# Patient Record
Sex: Male | Born: 1950 | Race: Black or African American | Hispanic: No | Marital: Single | Smoking: Never smoker
Health system: Southern US, Community
[De-identification: ages and names within clinical notes are randomized; demographics above are authoritative.]

## PROBLEM LIST (undated history)

## (undated) DIAGNOSIS — J45909 Unspecified asthma, uncomplicated: Secondary | ICD-10-CM

## (undated) DIAGNOSIS — J189 Pneumonia, unspecified organism: Secondary | ICD-10-CM

## (undated) DIAGNOSIS — N184 Chronic kidney disease, stage 4 (severe): Secondary | ICD-10-CM

## (undated) DIAGNOSIS — F0390 Unspecified dementia without behavioral disturbance: Secondary | ICD-10-CM

## (undated) DIAGNOSIS — K5900 Constipation, unspecified: Secondary | ICD-10-CM

## (undated) DIAGNOSIS — D649 Anemia, unspecified: Secondary | ICD-10-CM

## (undated) DIAGNOSIS — I10 Essential (primary) hypertension: Secondary | ICD-10-CM

## (undated) DIAGNOSIS — H3500 Unspecified background retinopathy: Secondary | ICD-10-CM

## (undated) DIAGNOSIS — E785 Hyperlipidemia, unspecified: Secondary | ICD-10-CM

## (undated) DIAGNOSIS — E1121 Type 2 diabetes mellitus with diabetic nephropathy: Secondary | ICD-10-CM

## (undated) DIAGNOSIS — I480 Paroxysmal atrial fibrillation: Secondary | ICD-10-CM

## (undated) DIAGNOSIS — I639 Cerebral infarction, unspecified: Secondary | ICD-10-CM

## (undated) DIAGNOSIS — I4892 Unspecified atrial flutter: Secondary | ICD-10-CM

## (undated) DIAGNOSIS — I739 Peripheral vascular disease, unspecified: Secondary | ICD-10-CM

## (undated) DIAGNOSIS — Z95810 Presence of automatic (implantable) cardiac defibrillator: Secondary | ICD-10-CM

## (undated) DIAGNOSIS — N186 End stage renal disease: Secondary | ICD-10-CM

## (undated) DIAGNOSIS — R93429 Abnormal radiologic findings on diagnostic imaging of unspecified kidney: Secondary | ICD-10-CM

## (undated) DIAGNOSIS — I2510 Atherosclerotic heart disease of native coronary artery without angina pectoris: Secondary | ICD-10-CM

## (undated) DIAGNOSIS — I255 Ischemic cardiomyopathy: Secondary | ICD-10-CM

## (undated) DIAGNOSIS — I5042 Chronic combined systolic (congestive) and diastolic (congestive) heart failure: Secondary | ICD-10-CM

## (undated) DIAGNOSIS — I219 Acute myocardial infarction, unspecified: Secondary | ICD-10-CM

## (undated) DIAGNOSIS — Z8674 Personal history of sudden cardiac arrest: Secondary | ICD-10-CM

## (undated) DIAGNOSIS — Z992 Dependence on renal dialysis: Secondary | ICD-10-CM

## (undated) HISTORY — PX: EYE SURGERY: SHX253

## (undated) HISTORY — PX: SPINE SURGERY: SHX786

## (undated) HISTORY — PX: HERNIA REPAIR: SHX51

## (undated) HISTORY — PX: COLONOSCOPY W/ POLYPECTOMY: SHX1380

## (undated) HISTORY — DX: Acute myocardial infarction, unspecified: I21.9

## (undated) HISTORY — DX: Anemia, unspecified: D64.9

## (undated) HISTORY — DX: Peripheral vascular disease, unspecified: I73.9

## (undated) HISTORY — PX: CORONARY STENT PLACEMENT: SHX1402

## (undated) HISTORY — PX: FINGER FRACTURE SURGERY: SHX638

## (undated) HISTORY — PX: INSERTION OF DIALYSIS CATHETER: SHX1324

## (undated) HISTORY — PX: CORONARY ANGIOPLASTY: SHX604

## (undated) HISTORY — PX: CARDIAC DEFIBRILLATOR PLACEMENT: SHX171

---

## 2014-04-01 ENCOUNTER — Inpatient Hospital Stay (HOSPITAL_COMMUNITY)
Admission: EM | Admit: 2014-04-01 | Discharge: 2014-04-08 | DRG: 871 | Disposition: A | Discharge status: Home Health | Payer: Medicare HMO | Attending: Internal Medicine | Admitting: Internal Medicine

## 2014-04-01 ENCOUNTER — Emergency Department (HOSPITAL_COMMUNITY): Payer: Medicare HMO

## 2014-04-01 ENCOUNTER — Encounter (HOSPITAL_COMMUNITY): Payer: Self-pay | Admitting: Emergency Medicine

## 2014-04-01 DIAGNOSIS — Z79899 Other long term (current) drug therapy: Secondary | ICD-10-CM

## 2014-04-01 DIAGNOSIS — N189 Chronic kidney disease, unspecified: Secondary | ICD-10-CM

## 2014-04-01 DIAGNOSIS — I4729 Other ventricular tachycardia: Secondary | ICD-10-CM | POA: Diagnosis not present

## 2014-04-01 DIAGNOSIS — I252 Old myocardial infarction: Secondary | ICD-10-CM

## 2014-04-01 DIAGNOSIS — N19 Unspecified kidney failure: Secondary | ICD-10-CM | POA: Diagnosis present

## 2014-04-01 DIAGNOSIS — I509 Heart failure, unspecified: Secondary | ICD-10-CM | POA: Diagnosis present

## 2014-04-01 DIAGNOSIS — R531 Weakness: Secondary | ICD-10-CM

## 2014-04-01 DIAGNOSIS — G9340 Encephalopathy, unspecified: Secondary | ICD-10-CM | POA: Diagnosis present

## 2014-04-01 DIAGNOSIS — Z794 Long term (current) use of insulin: Secondary | ICD-10-CM

## 2014-04-01 DIAGNOSIS — R93429 Abnormal radiologic findings on diagnostic imaging of unspecified kidney: Secondary | ICD-10-CM | POA: Diagnosis present

## 2014-04-01 DIAGNOSIS — Z7901 Long term (current) use of anticoagulants: Secondary | ICD-10-CM

## 2014-04-01 DIAGNOSIS — N179 Acute kidney failure, unspecified: Secondary | ICD-10-CM | POA: Diagnosis present

## 2014-04-01 DIAGNOSIS — I480 Paroxysmal atrial fibrillation: Secondary | ICD-10-CM

## 2014-04-01 DIAGNOSIS — R778 Other specified abnormalities of plasma proteins: Secondary | ICD-10-CM | POA: Diagnosis present

## 2014-04-01 DIAGNOSIS — R739 Hyperglycemia, unspecified: Secondary | ICD-10-CM | POA: Diagnosis present

## 2014-04-01 DIAGNOSIS — R7989 Other specified abnormal findings of blood chemistry: Secondary | ICD-10-CM | POA: Diagnosis present

## 2014-04-01 DIAGNOSIS — I472 Ventricular tachycardia, unspecified: Secondary | ICD-10-CM | POA: Diagnosis not present

## 2014-04-01 DIAGNOSIS — I2589 Other forms of chronic ischemic heart disease: Secondary | ICD-10-CM | POA: Diagnosis present

## 2014-04-01 DIAGNOSIS — N184 Chronic kidney disease, stage 4 (severe): Secondary | ICD-10-CM | POA: Diagnosis present

## 2014-04-01 DIAGNOSIS — I5022 Chronic systolic (congestive) heart failure: Secondary | ICD-10-CM

## 2014-04-01 DIAGNOSIS — F0390 Unspecified dementia without behavioral disturbance: Secondary | ICD-10-CM | POA: Diagnosis present

## 2014-04-01 DIAGNOSIS — G9341 Metabolic encephalopathy: Secondary | ICD-10-CM | POA: Diagnosis present

## 2014-04-01 DIAGNOSIS — R5381 Other malaise: Secondary | ICD-10-CM

## 2014-04-01 DIAGNOSIS — Z8249 Family history of ischemic heart disease and other diseases of the circulatory system: Secondary | ICD-10-CM

## 2014-04-01 DIAGNOSIS — I4891 Unspecified atrial fibrillation: Secondary | ICD-10-CM | POA: Diagnosis not present

## 2014-04-01 DIAGNOSIS — E872 Acidosis, unspecified: Secondary | ICD-10-CM | POA: Diagnosis present

## 2014-04-01 DIAGNOSIS — I2789 Other specified pulmonary heart diseases: Secondary | ICD-10-CM | POA: Diagnosis present

## 2014-04-01 DIAGNOSIS — I129 Hypertensive chronic kidney disease with stage 1 through stage 4 chronic kidney disease, or unspecified chronic kidney disease: Secondary | ICD-10-CM | POA: Diagnosis present

## 2014-04-01 DIAGNOSIS — A419 Sepsis, unspecified organism: Principal | ICD-10-CM | POA: Diagnosis present

## 2014-04-01 DIAGNOSIS — I4892 Unspecified atrial flutter: Secondary | ICD-10-CM | POA: Diagnosis not present

## 2014-04-01 DIAGNOSIS — I2510 Atherosclerotic heart disease of native coronary artery without angina pectoris: Secondary | ICD-10-CM | POA: Diagnosis present

## 2014-04-01 DIAGNOSIS — E1165 Type 2 diabetes mellitus with hyperglycemia: Secondary | ICD-10-CM | POA: Diagnosis present

## 2014-04-01 DIAGNOSIS — Z8673 Personal history of transient ischemic attack (TIA), and cerebral infarction without residual deficits: Secondary | ICD-10-CM

## 2014-04-01 DIAGNOSIS — E785 Hyperlipidemia, unspecified: Secondary | ICD-10-CM | POA: Diagnosis present

## 2014-04-01 DIAGNOSIS — I10 Essential (primary) hypertension: Secondary | ICD-10-CM | POA: Diagnosis present

## 2014-04-01 DIAGNOSIS — R112 Nausea with vomiting, unspecified: Secondary | ICD-10-CM

## 2014-04-01 DIAGNOSIS — R3129 Other microscopic hematuria: Secondary | ICD-10-CM | POA: Diagnosis present

## 2014-04-01 DIAGNOSIS — IMO0002 Reserved for concepts with insufficient information to code with codable children: Secondary | ICD-10-CM | POA: Diagnosis present

## 2014-04-01 DIAGNOSIS — I5042 Chronic combined systolic (congestive) and diastolic (congestive) heart failure: Secondary | ICD-10-CM | POA: Diagnosis present

## 2014-04-01 DIAGNOSIS — N058 Unspecified nephritic syndrome with other morphologic changes: Secondary | ICD-10-CM | POA: Diagnosis present

## 2014-04-01 DIAGNOSIS — E1129 Type 2 diabetes mellitus with other diabetic kidney complication: Secondary | ICD-10-CM | POA: Diagnosis present

## 2014-04-01 DIAGNOSIS — R9089 Other abnormal findings on diagnostic imaging of central nervous system: Secondary | ICD-10-CM | POA: Diagnosis present

## 2014-04-01 DIAGNOSIS — Z9861 Coronary angioplasty status: Secondary | ICD-10-CM

## 2014-04-01 DIAGNOSIS — R799 Abnormal finding of blood chemistry, unspecified: Secondary | ICD-10-CM | POA: Diagnosis present

## 2014-04-01 DIAGNOSIS — Z7902 Long term (current) use of antithrombotics/antiplatelets: Secondary | ICD-10-CM

## 2014-04-01 HISTORY — DX: Chronic combined systolic (congestive) and diastolic (congestive) heart failure: I50.42

## 2014-04-01 HISTORY — DX: Cerebral infarction, unspecified: I63.9

## 2014-04-01 HISTORY — DX: Hyperlipidemia, unspecified: E78.5

## 2014-04-01 HISTORY — DX: Unspecified dementia, unspecified severity, without behavioral disturbance, psychotic disturbance, mood disturbance, and anxiety: F03.90

## 2014-04-01 HISTORY — DX: Atherosclerotic heart disease of native coronary artery without angina pectoris: I25.10

## 2014-04-01 LAB — COMPREHENSIVE METABOLIC PANEL
ALT: 35 U/L (ref 0–53)
AST: 34 U/L (ref 0–37)
Albumin: 3.4 g/dL — ABNORMAL LOW (ref 3.5–5.2)
Alkaline Phosphatase: 173 U/L — ABNORMAL HIGH (ref 39–117)
BUN: 54 mg/dL — ABNORMAL HIGH (ref 6–23)
CO2: 27 mEq/L (ref 19–32)
Calcium: 9.8 mg/dL (ref 8.4–10.5)
Chloride: 86 mEq/L — ABNORMAL LOW (ref 96–112)
Creatinine, Ser: 3.63 mg/dL — ABNORMAL HIGH (ref 0.50–1.35)
GFR calc Af Amer: 19 mL/min — ABNORMAL LOW (ref >90)
GFR calc non Af Amer: 17 mL/min — ABNORMAL LOW (ref >90)
Glucose, Bld: 529 mg/dL — ABNORMAL HIGH (ref 70–99)
Potassium: 3.6 mEq/L — ABNORMAL LOW (ref 3.7–5.3)
Sodium: 131 mEq/L — ABNORMAL LOW (ref 137–147)
Total Bilirubin: 1.8 mg/dL — ABNORMAL HIGH (ref 0.3–1.2)
Total Protein: 7.6 g/dL (ref 6.0–8.3)

## 2014-04-01 LAB — CBC
HCT: 43.2 % (ref 39.0–52.0)
Hemoglobin: 14.9 g/dL (ref 13.0–17.0)
MCH: 30.6 pg (ref 26.0–34.0)
MCHC: 34.5 g/dL (ref 30.0–36.0)
MCV: 88.7 fL (ref 78.0–100.0)
Platelets: 188 10*3/uL (ref 150–400)
RBC: 4.87 MIL/uL (ref 4.22–5.81)
RDW: 12.5 % (ref 11.5–15.5)
WBC: 11.3 10*3/uL — ABNORMAL HIGH (ref 4.0–10.5)

## 2014-04-01 LAB — I-STAT TROPONIN, ED: Troponin i, poc: 0.11 ng/mL (ref 0.00–0.08)

## 2014-04-01 LAB — CBG MONITORING, ED: Glucose-Capillary: 487 mg/dL — ABNORMAL HIGH (ref 70–99)

## 2014-04-01 LAB — I-STAT CG4 LACTIC ACID, ED: Lactic Acid, Venous: 4.53 mmol/L — ABNORMAL HIGH (ref 0.5–2.2)

## 2014-04-01 MED ORDER — INSULIN ASPART 100 UNIT/ML ~~LOC~~ SOLN
5.0000 [IU] | Freq: Once | SUBCUTANEOUS | Status: AC
  Administered 2014-04-01: 5 [IU] via SUBCUTANEOUS
  Filled 2014-04-01: qty 1

## 2014-04-01 MED ORDER — SODIUM CHLORIDE 0.9 % IV BOLUS (SEPSIS)
250.0000 mL | Freq: Once | INTRAVENOUS | Status: AC
  Administered 2014-04-01: 23:00:00 via INTRAVENOUS

## 2014-04-01 NOTE — ED Notes (Addendum)
Dr Tomi Bamberger given a copy of lactic acid results 4.53

## 2014-04-01 NOTE — ED Notes (Addendum)
Dr Tomi Bamberger given a copy of troponin results .[place]

## 2014-04-01 NOTE — ED Notes (Signed)
Pt arrives from home with c/o hyperglycemia, first noticed at 1800. Pt's glucometer did not report number, just said high.

## 2014-04-01 NOTE — ED Notes (Signed)
Pt family member noticed pt had not eaten, weakness/dizziness. Had insulin from family member. Pt family unable to report his medications or what insulin he gave. Was choking on spit as well.

## 2014-04-01 NOTE — ED Notes (Signed)
CBG Taken = 487

## 2014-04-01 NOTE — ED Provider Notes (Signed)
CSN: [id number]     Arrival date & time 04/01/14  2233 History   First MD Initiated Contact with Patient 04/01/14 2303     Chief Complaint  Patient presents with  . Hyperglycemia     (Consider location/radiation/quality/duration/timing/severity/associated sxs/prior Treatment) HPI 63 yo man with DM, cardiomyopathy, CKD stage IV and multi-infarct dementia. He is BIB POV by his son. The patient lives with his daughter in [place]. Dtr took patient to visit son at around 1830. Son noticed that the patient seemed very weak. Says that he gave patient his insulin and then tried to get him to eat some food.   [name] on his son's couch. Son was in the other room when he heard patient choking. He went in to find the patient choking. He helped the patient up to sitting from supine position. Patient seemed "out of it" - as if he needed physical support to sit upright. The patient then vomited several times - NBNB.   Son says that the patient has had similar presentation in the past, "when his sugar is sky high".  Son notes that the patient sometimes forgets to take his insulin. Accucheck reading on home glucometer was "high" just before presentation.   I am unable to obtain much history from the patient. He endorses feeling weak. Denies pain. Denies SOB.   Past Medical History  Diagnosis Date  . Diabetes mellitus without complication   . CHF (congestive heart failure)   . Kidney failure    Past Surgical History  Procedure Laterality Date  . Coronary stent placement     No family history on file. History  Substance Use Topics  . Smoking status: Not on file  . Smokeless tobacco: Not on file  . Alcohol Use: Not on file    Review of Systems UNABLE TO OBTAIN FROM THE PATIENT DUE TO AMS.     Allergies  Review of patient's allergies indicates no known allergies.  Home Medications   Prior to Admission medications   Not on File   BP 88/50  Ht 5\' 6"  (1.676 m)  Wt 220 lb  (99.791 kg)  BMI 35.53 kg/m2 Physical Exam  Constitutional: He is oriented to person, place, and time. He appears well-developed and well-nourished.  Acutely ill appearing  HENT:  Head: Normocephalic and atraumatic.  Oral mucosa dehydrated appearing, posterior oropharynx without lesions, uvula midline  Eyes: Conjunctivae and EOM are normal. Pupils are equal, round, and reactive to light.  Neck: No tracheal deviation present.  Cardiovascular: Normal rate, regular rhythm and normal heart sounds.   Pulmonary/Chest: Effort normal and breath sounds normal. No respiratory distress. He has no wheezes. He has no rales.  Abdominal: Soft. Bowel sounds are normal. He exhibits no distension.  Musculoskeletal: He exhibits no edema.  Neurological: He is alert and oriented to person, place, and time. No cranial nerve deficit.  Skin: Skin is warm and dry.  Psychiatric:  Unable to evaluate.      ED Course  Procedures (including critical care time)  Medical screening examination/treatment/procedure(s) were performed by non-physician practitioner and as supervising physician I was immediately available for consultation/collaboration.   EKG Interpretation   Date/Time:  Sunday April 02 2014 01:23:21 EDT Ventricular Rate:  67 PR Interval:  160 QRS Duration: 114 QT Interval:  424 QTC Calculation: 448 R Axis:   -42 Text Interpretation:  Sinus rhythm Borderline IVCD with LAD Abnormal T,  consider ischemia, diffuse leads ED PHYSICIAN INTERPRETATION AVAILABLE IN  [facility]  [facility] Confirmed by TEST, Record ([id number]) on 04/04/2014 7:23:43 AM         Labs Review Labs Reviewed  CBG MONITORING, ED - Abnormal; Notable for the following:    Glucose-Capillary 487 (*)    All other components within normal limits  CBC  COMPREHENSIVE METABOLIC PANEL  URINALYSIS, ROUTINE W REFLEX MICROSCOPIC  CBG MONITORING, ED     EKG Interpretation None      CRITICAL CARE Performed by: Elyn Peers   Total  critical care time: 17m  Critical care time was exclusive of separately billable procedures and treating other patients.  Critical care was necessary to treat or prevent imminent or life-threatening deterioration.  Critical care was time spent personally by me on the following activities: development of treatment plan with patient and/or surrogate as well as nursing, discussions with consultants, evaluation of patient's response to treatment, examination of patient, obtaining history from patient or surrogate, ordering and performing treatments and interventions, ordering and review of laboratory studies, ordering and review of radiographic studies, pulse oximetry and re-evaluation of patient's condition.   MDM   Patient with non-ketotic hyperglycemia along with acute on chronic renal failure. We are treating with NS IV boluses and insulin. Case discussed with hospitalist who has agreed to admit.   Elyn Peers, MD 04/12/14 [phone]

## 2014-04-01 NOTE — ED Notes (Signed)
MD at bedside. 

## 2014-04-02 ENCOUNTER — Inpatient Hospital Stay (HOSPITAL_COMMUNITY): Payer: Medicare HMO

## 2014-04-02 ENCOUNTER — Encounter (HOSPITAL_COMMUNITY): Payer: Self-pay | Admitting: Cardiology

## 2014-04-02 DIAGNOSIS — N189 Chronic kidney disease, unspecified: Secondary | ICD-10-CM

## 2014-04-02 DIAGNOSIS — G9340 Encephalopathy, unspecified: Secondary | ICD-10-CM

## 2014-04-02 DIAGNOSIS — A419 Sepsis, unspecified organism: Secondary | ICD-10-CM | POA: Insufficient documentation

## 2014-04-02 DIAGNOSIS — N19 Unspecified kidney failure: Secondary | ICD-10-CM | POA: Diagnosis present

## 2014-04-02 DIAGNOSIS — R7309 Other abnormal glucose: Secondary | ICD-10-CM

## 2014-04-02 DIAGNOSIS — R799 Abnormal finding of blood chemistry, unspecified: Secondary | ICD-10-CM

## 2014-04-02 DIAGNOSIS — R739 Hyperglycemia, unspecified: Secondary | ICD-10-CM | POA: Diagnosis present

## 2014-04-02 DIAGNOSIS — R778 Other specified abnormalities of plasma proteins: Secondary | ICD-10-CM | POA: Diagnosis present

## 2014-04-02 DIAGNOSIS — R7989 Other specified abnormal findings of blood chemistry: Secondary | ICD-10-CM

## 2014-04-02 DIAGNOSIS — I517 Cardiomegaly: Secondary | ICD-10-CM

## 2014-04-02 LAB — LACTIC ACID, PLASMA
Lactic Acid, Venous: 3.1 mmol/L — ABNORMAL HIGH (ref 0.5–2.2)
Lactic Acid, Venous: 1.4 mmol/L (ref 0.5–2.2)

## 2014-04-02 LAB — CBC
HCT: 39.3 % (ref 39.0–52.0)
HEMOGLOBIN: 13.7 g/dL (ref 13.0–17.0)
MCH: 30.9 pg (ref 26.0–34.0)
MCHC: 34.9 g/dL (ref 30.0–36.0)
MCV: 88.5 fL (ref 78.0–100.0)
PLATELETS: 157 10*3/uL (ref 150–400)
RBC: 4.44 MIL/uL (ref 4.22–5.81)
RDW: 12.5 % (ref 11.5–15.5)
WBC: 9.1 10*3/uL (ref 4.0–10.5)

## 2014-04-02 LAB — URINALYSIS, ROUTINE W REFLEX MICROSCOPIC
Bilirubin Urine: NEGATIVE
Glucose, UA: >1000 mg/dL — AB
KETONES UR: NEGATIVE mg/dL
Leukocytes, UA: NEGATIVE
NITRITE: NEGATIVE
PH: 5 (ref 5.0–8.0)
Protein, ur: >300 mg/dL — AB
Specific Gravity, Urine: 1.028 (ref 1.005–1.030)
UROBILINOGEN UA: 0.2 mg/dL (ref 0.0–1.0)

## 2014-04-02 LAB — I-STAT ARTERIAL BLOOD GAS, ED
Acid-Base Excess: 5 mmol/L — ABNORMAL HIGH (ref 0.0–2.0)
Bicarbonate: 28.9 meq/L — ABNORMAL HIGH (ref 20.0–24.0)
O2 Saturation: 94 %
Patient temperature: 98.6
TCO2: 30 mmol/L (ref 0–100)
pCO2 arterial: 38.2 mmHg (ref 35.0–45.0)
pH, Arterial: 7.486 — ABNORMAL HIGH (ref 7.350–7.450)
pO2, Arterial: 64 mmHg — ABNORMAL LOW (ref 80.0–100.0)

## 2014-04-02 LAB — URINE MICROSCOPIC-ADD ON

## 2014-04-02 LAB — BASIC METABOLIC PANEL WITH GFR
BUN: 58 mg/dL — ABNORMAL HIGH (ref 6–23)
CO2: 31 meq/L (ref 19–32)
Calcium: 9.6 mg/dL (ref 8.4–10.5)
Chloride: 88 meq/L — ABNORMAL LOW (ref 96–112)
Creatinine, Ser: 4.05 mg/dL — ABNORMAL HIGH (ref 0.50–1.35)
GFR calc Af Amer: 17 mL/min — ABNORMAL LOW
GFR calc non Af Amer: 15 mL/min — ABNORMAL LOW
Glucose, Bld: 334 mg/dL — ABNORMAL HIGH (ref 70–99)
Potassium: 3.8 meq/L (ref 3.7–5.3)
Sodium: 133 meq/L — ABNORMAL LOW (ref 137–147)

## 2014-04-02 LAB — CBG MONITORING, ED: Glucose-Capillary: 391 mg/dL — ABNORMAL HIGH (ref 70–99)

## 2014-04-02 LAB — GLUCOSE, CAPILLARY
GLUCOSE-CAPILLARY: 303 mg/dL — AB (ref 70–99)
GLUCOSE-CAPILLARY: 337 mg/dL — AB (ref 70–99)
Glucose-Capillary: 356 mg/dL — ABNORMAL HIGH (ref 70–99)
Glucose-Capillary: 334 mg/dL — ABNORMAL HIGH (ref 70–99)
Glucose-Capillary: 260 mg/dL — ABNORMAL HIGH (ref 70–99)
Glucose-Capillary: 343 mg/dL — ABNORMAL HIGH (ref 70–99)

## 2014-04-02 LAB — ETHANOL: Alcohol, Ethyl (B): <11 mg/dL (ref 0–11)

## 2014-04-02 LAB — TROPONIN I
Troponin I: <0.3 ng/mL (ref <0.30)
Troponin I: <0.3 ng/mL

## 2014-04-02 LAB — KETONES, QUALITATIVE: Acetone, Bld: NEGATIVE

## 2014-04-02 LAB — POCT I-STAT TROPONIN I: TROPONIN I, POC: 0.05 ng/mL (ref 0.00–0.08)

## 2014-04-02 LAB — I-STAT CG4 LACTIC ACID, ED: Lactic Acid, Venous: 1.85 mmol/L (ref 0.5–2.2)

## 2014-04-02 LAB — MRSA PCR SCREENING: MRSA by PCR: NEGATIVE

## 2014-04-02 LAB — AMMONIA: Ammonia: 28 umol/L (ref 11–60)

## 2014-04-02 MED ORDER — CLOPIDOGREL BISULFATE 75 MG PO TABS
75.0000 mg | ORAL_TABLET | Freq: Every day | ORAL | Status: DC
  Filled 2014-04-02: qty 1

## 2014-04-02 MED ORDER — SODIUM CHLORIDE 0.9 % IV SOLN: INTRAVENOUS | Status: DC

## 2014-04-02 MED ORDER — CLOPIDOGREL BISULFATE 75 MG PO TABS
75.0000 mg | ORAL_TABLET | Freq: Every day | ORAL | Status: DC
  Filled 2014-04-02 (×2): qty 1

## 2014-04-02 MED ORDER — ONDANSETRON HCL 4 MG PO TABS: 4.0000 mg | ORAL_TABLET | Freq: Four times a day (QID) | ORAL | Status: DC | PRN

## 2014-04-02 MED ORDER — SODIUM CHLORIDE 0.9 % IV SOLN: 250.0000 mL | INTRAVENOUS | Status: DC | PRN

## 2014-04-02 MED ORDER — PIPERACILLIN-TAZOBACTAM IN DEX 2-0.25 GM/50ML IV SOLN
2.2500 g | Freq: Three times a day (TID) | INTRAVENOUS | Status: DC
  Administered 2014-04-02 (×3): 2.25 g via INTRAVENOUS
  Filled 2014-04-02 (×6): qty 50

## 2014-04-02 MED ORDER — ASPIRIN EC 81 MG PO TBEC
81.0000 mg | DELAYED_RELEASE_TABLET | Freq: Every day | ORAL | Status: DC
  Filled 2014-04-02: qty 1

## 2014-04-02 MED ORDER — SODIUM CHLORIDE 0.9 % IV BOLUS (SEPSIS): 250.0000 mL | Freq: Once | INTRAVENOUS | Status: DC

## 2014-04-02 MED ORDER — SODIUM CHLORIDE 0.9 % IJ SOLN
3.0000 mL | Freq: Two times a day (BID) | INTRAMUSCULAR | Status: DC
  Administered 2014-04-02 – 2014-04-05 (×4): 3 mL via INTRAVENOUS

## 2014-04-02 MED ORDER — CLINDAMYCIN PHOSPHATE 600 MG/50ML IV SOLN
600.0000 mg | Freq: Once | INTRAVENOUS | Status: AC
  Administered 2014-04-02: 600 mg via INTRAVENOUS
  Filled 2014-04-02: qty 50

## 2014-04-02 MED ORDER — SODIUM CHLORIDE 0.9 % IV SOLN
INTRAVENOUS | Status: AC
  Administered 2014-04-02: 50 mL/h via INTRAVENOUS

## 2014-04-02 MED ORDER — INSULIN ASPART 100 UNIT/ML ~~LOC~~ SOLN
0.0000 [IU] | Freq: Three times a day (TID) | SUBCUTANEOUS | Status: DC
  Administered 2014-04-02: 7 [IU] via SUBCUTANEOUS
  Administered 2014-04-02: 5 [IU] via SUBCUTANEOUS
  Administered 2014-04-02: 7 [IU] via SUBCUTANEOUS

## 2014-04-02 MED ORDER — BIOTENE DRY MOUTH MT LIQD
15.0000 mL | Freq: Two times a day (BID) | OROMUCOSAL | Status: DC
  Administered 2014-04-02 (×2): 15 mL via OROMUCOSAL

## 2014-04-02 MED ORDER — SODIUM CHLORIDE 0.9 % IJ SOLN: 3.0000 mL | INTRAMUSCULAR | Status: DC | PRN

## 2014-04-02 MED ORDER — VANCOMYCIN HCL 10 G IV SOLR
2000.0000 mg | Freq: Once | INTRAVENOUS | Status: AC
  Administered 2014-04-02: 2000 mg via INTRAVENOUS
  Filled 2014-04-02: qty 2000

## 2014-04-02 MED ORDER — CLOPIDOGREL BISULFATE 75 MG PO TABS
75.0000 mg | ORAL_TABLET | Freq: Every day | ORAL | Status: DC
  Administered 2014-04-03 – 2014-04-04 (×2): 75 mg via ORAL
  Filled 2014-04-02 (×3): qty 1

## 2014-04-02 MED ORDER — PIPERACILLIN-TAZOBACTAM 3.375 G IVPB 30 MIN
3.3750 g | Freq: Once | INTRAVENOUS | Status: AC
  Administered 2014-04-02: 3.375 g via INTRAVENOUS
  Filled 2014-04-02: qty 50

## 2014-04-02 MED ORDER — ASPIRIN EC 81 MG PO TBEC
81.0000 mg | DELAYED_RELEASE_TABLET | Freq: Every day | ORAL | Status: DC
  Administered 2014-04-02 – 2014-04-05 (×4): 81 mg via ORAL
  Filled 2014-04-02 (×4): qty 1

## 2014-04-02 MED ORDER — ATORVASTATIN CALCIUM 40 MG PO TABS
40.0000 mg | ORAL_TABLET | Freq: Every day | ORAL | Status: DC
  Administered 2014-04-02 – 2014-04-08 (×7): 40 mg via ORAL
  Filled 2014-04-02 (×7): qty 1

## 2014-04-02 MED ORDER — HEPARIN SODIUM (PORCINE) 5000 UNIT/ML IJ SOLN
5000.0000 [IU] | Freq: Three times a day (TID) | INTRAMUSCULAR | Status: DC
  Administered 2014-04-02 – 2014-04-03 (×4): 5000 [IU] via SUBCUTANEOUS
  Filled 2014-04-02 (×7): qty 1

## 2014-04-02 MED ORDER — INSULIN GLARGINE 100 UNIT/ML ~~LOC~~ SOLN
10.0000 [IU] | Freq: Every day | SUBCUTANEOUS | Status: DC
  Administered 2014-04-02: 10 [IU] via SUBCUTANEOUS
  Filled 2014-04-02: qty 0.1

## 2014-04-02 MED ORDER — INSULIN GLARGINE 100 UNIT/ML ~~LOC~~ SOLN
30.0000 [IU] | Freq: Every day | SUBCUTANEOUS | Status: DC
  Filled 2014-04-02: qty 0.3

## 2014-04-02 MED ORDER — VANCOMYCIN HCL 10 G IV SOLR: 1500.0000 mg | INTRAVENOUS | Status: DC

## 2014-04-02 MED ORDER — ATORVASTATIN CALCIUM 40 MG PO TABS
40.0000 mg | ORAL_TABLET | Freq: Every day | ORAL | Status: DC
  Filled 2014-04-02: qty 1

## 2014-04-02 MED ORDER — INSULIN GLARGINE 100 UNIT/ML ~~LOC~~ SOLN
10.0000 [IU] | Freq: Every day | SUBCUTANEOUS | Status: DC
  Filled 2014-04-02: qty 0.1

## 2014-04-02 MED ORDER — ONDANSETRON HCL 4 MG/2ML IJ SOLN: 4.0000 mg | Freq: Four times a day (QID) | INTRAMUSCULAR | Status: DC | PRN

## 2014-04-02 MED ORDER — CHLORHEXIDINE GLUCONATE 0.12 % MT SOLN
15.0000 mL | Freq: Two times a day (BID) | OROMUCOSAL | Status: DC
  Administered 2014-04-02: 15 mL via OROMUCOSAL
  Filled 2014-04-02 (×3): qty 15

## 2014-04-02 NOTE — Progress Notes (Signed)
Dr. Rosezella [place] consult is noted. We will follow and help as needed.

## 2014-04-02 NOTE — Progress Notes (Signed)
ANTIBIOTIC CONSULT NOTE - INITIAL  Pharmacy Consult for zosyn and vancomycin Indication: aspiration PNA  No Known Allergies  Patient Measurements: Height: 5\' 6"  (167.6 cm) Weight: 220 lb (99.791 kg) IBW/kg (Calculated) : 63.8   Vital Signs: Temp: 99.7 F (37.6 C) (06/06 2317) Temp src: Rectal (06/06 2317) BP: 118/101 mmHg (06/07 0009) Intake/Output from previous day: 06/06 0701 - 06/07 0700 In: -  Out: 10 [Urine:10] Intake/Output from this shift: Total I/O In: -  Out: 10 [Urine:10]  Labs:  Recent Labs  04/01/14 2300  WBC 11.3*  HGB 14.9  PLT 188  CREATININE 3.63*   Estimated Creatinine Clearance: 23.6 ml/min (by C-G formula based on Cr of 3.63). No results found for this basename: VANCOTROUGH, VANCOPEAK, VANCORANDOM, GENTTROUGH, GENTPEAK, GENTRANDOM, TOBRATROUGH, TOBRAPEAK, TOBRARND, AMIKACINPEAK, AMIKACINTROU, AMIKACIN,  in the last 72 hours   Microbiology: No results found for this or any previous visit (from the past 720 hour(s)).  Medical History: Past Medical History  Diagnosis Date  . Diabetes mellitus without complication   . CHF (congestive heart failure)   . Kidney failure     Medications: no abx  Assessment: 63 yo M to start vancomycin and zosyn per pharmacy for aspiration PNA.  PMH:  DM, CHF,  CKD stage IV, dementia.  Wt 99.8 kg.  Creat 3.63. WBC 11.3. Temp 99.7.    Goal of Therapy:  Vancomycin trough level 15-20 mcg/ml  Plan:  1. Zosyn 3.375 gm IV x 1 then zosyn 2.25 gm IV q8h 2. Vancomycin 2000 mg IV x 1 then vancomycin 1500 mg IV q48hr 3. F/u renal function, temp, wbc, culture data, CXR 4. Vancomycin trough as needed  Eudelia Bunch, Pharm.D. [phone] 04/02/2014 1:17 AM

## 2014-04-02 NOTE — Consult Note (Signed)
CARDIOLOGY CONSULT NOTE  Patient ID: Tony Freeman MRN: [id number] DOB/AGE: February 08, 1951 63 y.o.  Admit date: 04/01/2014 Primary Physician  Primary Cardiologist Dr. Shirlee More Chief Complaint  Confusion  HPI:  The patient is new to this hospital.  He apparently has a history of cardiomyopathy and CAD with stenting.  He was brought to the ED by his son for evaluation of AMS.  He was not having chest pain or overt cardiac complaints.  However,a troponin was mildly elevated at 0.11.  However, he also has renal insufficiency with a creat of 3.63.  Lactic acid was elevated.  Head CT demonstrated an age indeterminate left lacunar infarct.    The patient is not able to give the history.  He denies any symptoms however of chest pain or SOB.  His son brought him and does not report any acute cardiovascular symptoms.  He does apparently have an ischemic cardiomyopathy with an EF of 25% and 10 stents per his son.  He has not had any apparent new symptoms such as chest discomfort, neck or arm discomfort. There has been no new shortness of breath, PND or orthopnea. There have been no reported palpitations, presyncope or syncope.  He came to visit his son today and he forgot his insulin.  He became confused, somnolent and started to choke.  His BS at home was high.  He was brought to the ED.   Past Medical History  Diagnosis Date  . Diabetes mellitus without complication   . CHF (congestive heart failure)   . Kidney failure   . CAD (coronary artery disease)   . Hyperlipidemia   . CVA (cerebral infarction)     Past Surgical History  Procedure Laterality Date  . Coronary stent placement      No Known Allergies  Prior to Admission medications   Medication Sig Start Date End Date Taking? Authorizing Provider  aspirin EC 81 MG tablet Take 81 mg by mouth daily.   Yes Historical Provider, MD  atorvastatin (LIPITOR) 40 MG tablet Take 40 mg by mouth daily.   Yes Historical Provider, MD    cholecalciferol (VITAMIN D) 1000 UNITS tablet Take 2,000 Units by mouth daily.   Yes Historical Provider, MD  clopidogrel (PLAVIX) 75 MG tablet Take 75 mg by mouth daily with breakfast.   Yes Historical Provider, MD  furosemide (LASIX) 40 MG tablet Take 40 mg by mouth 2 (two) times daily.   Yes Historical Provider, MD  insulin glargine (LANTUS) 100 UNIT/ML injection Inject 40 Units into the skin at bedtime.   Yes Historical Provider, MD  insulin lispro (HUMALOG) 100 UNIT/ML injection Inject 25 Units into the skin 2 (two) times daily with breakfast and lunch.   Yes Historical Provider, MD  isosorbide mononitrate (IMDUR) 60 MG 24 hr tablet Take 60 mg by mouth daily.   Yes Historical Provider, MD  metoprolol (TOPROL-XL) 200 MG 24 hr tablet Take 200 mg by mouth daily.   Yes Historical Provider, MD  Multiple Vitamin (MULTIVITAMIN WITH MINERALS) TABS tablet Take 1 tablet by mouth daily.   Yes Historical Provider, MD    (Not in a hospital admission) Family History  Problem Relation Age of Onset  . Heart disease Mother     History   Social History  . Marital Status: Unknown    Spouse Name: N/A    Number of Children: N/A  . Years of Education: N/A   Occupational History  . Not on file.   Social History Main Topics  .  Smoking status: Never Smoker   . Smokeless tobacco: Not on file  . Alcohol Use: Not on file  . Drug Use: Not on file  . Sexual Activity: Not on file   Other Topics Concern  . Not on file   Social History Narrative   Going to be living with his son.  He lives with his daughter currently.  2 children.  Disability since CVA     ROS:  Denies any positive ROS.  However, he is confused and somnolent. Otherwise as stated in the HPI and negative for all other systems.  Physical Exam: Blood pressure 118/101, temperature 99.7 F (37.6 C), temperature source Rectal, resp. rate 22, height 5\' 6"  (1.676 m), weight 220 lb (99.791 kg), SpO2 96.00%.  GENERAL:  Confused.  No acute  distress HEENT:  Pupils equal round and reactive, fundi not visualized, oral mucosa unremarkable, poor dentition.  NECK:  No jugular venous distention, waveform within normal limits, carotid upstroke brisk and symmetric, no bruits, no thyromegaly LYMPHATICS:  No cervical, inguinal adenopathy LUNGS:  Clear to auscultation bilaterally BACK:  No CVA tenderness CHEST:  Unremarkable HEART:  PMI not displaced or sustained,S1 and S2 within normal limits, no S3, no S4, no clicks, no rubs, no murmurs ABD:  Flat, positive bowel sounds normal in frequency in pitch, no bruits, no rebound, no guarding, no midline pulsatile mass, no hepatomegaly, no splenomegaly EXT:  2 plus pulses upper, decreased DP/PT bilateral, no edema, no cyanosis no clubbing SKIN:  No rashes no nodules NEURO:  Cranial nerves II through XII grossly intact, motor grossly intact throughout PSYCH:  Able to answer some questions but lethargic   Labs: Lab Results  Component Value Date   BUN 54* 04/01/2014   Lab Results  Component Value Date   CREATININE 3.63* 04/01/2014   Lab Results  Component Value Date   NA 131* 04/01/2014   K 3.6* 04/01/2014   CL 86* 04/01/2014   CO2 27 04/01/2014   No results found for this basename: TROPONINI   Lab Results  Component Value Date   WBC 11.3* 04/01/2014   HGB 14.9 04/01/2014   HCT 43.2 04/01/2014   MCV 88.7 04/01/2014   PLT 188 04/01/2014   No results found for this basename: CHOL,  HDL,  LDLCALC,  LDLDIRECT,  TRIG,  CHOLHDL   Lab Results  Component Value Date   ALT 35 04/01/2014   AST 34 04/01/2014   ALKPHOS 173* 04/01/2014   BILITOT 1.8* 04/01/2014     Radiology:   CXR: The cardiac silhouette is enlarged. A calcified vessel or stent is  present over the left cardiac apex. The pulmonary vascularity is not  engorged. The lungs are hypoinflated. There is no pleural effusion.  There is degenerative change of the left glenohumeral joint.   EKG:NSR, rate 71, inferolateral T wave inversion with possible  ischemia.  04/02/2014  ASSESSMENT AND PLAN:   ELEVATED TROPONIN:  This is likely secondary to his acute illness and CKD.  At this point given the absence of any clinical correlation suggest an ACS no further workup would be indicated other than an echo to assess his EF to help guide fluid management.  We need records from [place].  I was not able to find these in [facility].   HISTORY OF CARDIOMYOPATHY:  As above.   ACUTE RENAL INSUFFICIENCY:   Per primary team.   Signed: Minus Breeding 04/02/2014, 1:44 AM

## 2014-04-02 NOTE — H&P (Signed)
[facility] History and Physical  Tony Freeman ZTI:[id number] DOB: [birth year] DOA: 04/01/2014  Referring physician: Dr Cheri Guppy PCP: No primary provider on file.   Chief Complaint: AMS.   HPI: Tony Freeman is a 63 y.o. male with PMH significant for CKD, Heart failure, cardiomyopathy, he gets his care at [facility] who was brought to the ED by son due to AMS. Patient was visiting son today. Patient was notice to be very weak, change in mental status, not as conversant. Patient Blood sugar was too high to read.   Patient went to sleep, son relates patient wake up making a sound like if he was shocking.  Per son patient has been complaining of side , flank pain. Patient complaining of ankle pain and shoulder pain.   Review of Systems:  Negative, except as per HPI.   Past Medical History  Diagnosis Date  . Diabetes mellitus without complication   . CHF (congestive heart failure)   . Kidney failure    Past Surgical History  Procedure Laterality Date  . Coronary stent placement     Social History:  has no tobacco, alcohol, and drug history on file.  No Known Allergies  No family history on file.   Prior to Admission medications   Medication Sig Start Date End Date Taking? Authorizing Provider  aspirin EC 81 MG tablet Take 81 mg by mouth daily.   Yes Historical Provider, MD  atorvastatin (LIPITOR) 40 MG tablet Take 40 mg by mouth daily.   Yes Historical Provider, MD  cholecalciferol (VITAMIN D) 1000 UNITS tablet Take 2,000 Units by mouth daily.   Yes Historical Provider, MD  clopidogrel (PLAVIX) 75 MG tablet Take 75 mg by mouth daily with breakfast.   Yes Historical Provider, MD  furosemide (LASIX) 40 MG tablet Take 40 mg by mouth 2 (two) times daily.   Yes Historical Provider, MD  insulin glargine (LANTUS) 100 UNIT/ML injection Inject 40 Units into the skin at bedtime.   Yes Historical Provider, MD  insulin lispro (HUMALOG) 100 UNIT/ML injection Inject 25  Units into the skin 2 (two) times daily with breakfast and lunch.   Yes Historical Provider, MD  isosorbide mononitrate (IMDUR) 60 MG 24 hr tablet Take 60 mg by mouth daily.   Yes Historical Provider, MD  metoprolol (TOPROL-XL) 200 MG 24 hr tablet Take 200 mg by mouth daily.   Yes Historical Provider, MD  Multiple Vitamin (MULTIVITAMIN WITH MINERALS) TABS tablet Take 1 tablet by mouth daily.   Yes Historical Provider, MD   Physical Exam: Filed Vitals:   04/02/14 0009  BP: 118/101  Temp:   Resp: 22    BP 118/101  Temp(Src) 99.7 F (37.6 C) (Rectal)  Resp 22  Ht 5\' 6"  (1.676 m)  Wt 99.791 kg (220 lb)  BMI 35.53 kg/m2  SpO2 96%  General:  Appears calm and comfortable Eyes: PERRL, normal lids, irises & conjunctiva ENT: grossly normal hearing, lips & tongue Neck: no LAD, masses or thyromegaly Cardiovascular: RRR, no m/r/g. No LE edema. Telemetry: SR, no arrhythmias  Respiratory: CTA bilaterally, no w/r/r. Normal respiratory effort. Abdomen: soft, ntnd Skin: no rash or induration seen on limited exam Musculoskeletal: grossly normal tone BUE/BLE Neurologic: He is following command, slow in responding to questions,  motor strength 5/5.            Labs on Admission:  Basic Metabolic Panel:  Recent Labs Lab 04/01/14 2300  NA 131*  K 3.6*  CL 86*  CO2  27  GLUCOSE 529*  BUN 54*  CREATININE 3.63*  CALCIUM 9.8   Liver Function Tests:  Recent Labs Lab 04/01/14 2300  AST 34  ALT 35  ALKPHOS 173*  BILITOT 1.8*  PROT 7.6  ALBUMIN 3.4*   No results found for this basename: LIPASE, AMYLASE,  in the last 168 hours No results found for this basename: AMMONIA,  in the last 168 hours CBC:  Recent Labs Lab 04/01/14 2300  WBC 11.3*  HGB 14.9  HCT 43.2  MCV 88.7  PLT 188   Cardiac Enzymes: No results found for this basename: CKTOTAL, CKMB, CKMBINDEX, TROPONINI,  in the last 168 hours  BNP (last 3 results) No results found for this basename: PROBNP,  in the last  8760 hours CBG:  Recent Labs Lab 04/01/14 2246 04/02/14 0034  GLUCAP 487* 391*    Radiological Exams on Admission: Ct Head Wo Contrast  04/02/2014   CLINICAL DATA:  Weakness and dizziness.  EXAM: CT HEAD WITHOUT CONTRAST  TECHNIQUE: Contiguous axial images were obtained from the base of the skull through the vertex without intravenous contrast.  COMPARISON:  None.  FINDINGS: The ventricles are in the midline and demonstrate normal configuration. No mass effect or shift. No extra-axial fluid collections are identified. Focal area of low attenuation in the left thymic area is likely an age indeterminate lacunar type infarct. No hemorrhage. No findings for hemispheric infarction. No mass lesions. The brainstem and cerebellum are grossly normal.  The bony structures are unremarkable. The paranasal sinuses and mastoid air cells are clear.  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  No findings for hemispheric infarction or intracranial hemorrhage.   Electronically Signed   By: Kalman Jewels M.D.   On: 04/02/2014 00:13   Dg Chest Port 1 View  04/01/2014   CLINICAL DATA:  Altered mental status; history of coronary artery disease.  EXAM: PORTABLE CHEST - 1 VIEW  COMPARISON:  None.  FINDINGS: The cardiac silhouette is enlarged. A calcified vessel or stent is present over the left cardiac apex. The pulmonary vascularity is not engorged. The lungs are hypoinflated. There is no pleural effusion. There is degenerative change of the left glenohumeral joint. New  IMPRESSION: There is no evidence of aspiration. The cardiac silhouette is enlarged without evidence of pulmonary edema.   Electronically Signed   By: David  Martinique   On: 04/01/2014 23:46    EKG: Independently reviewed.   Assessment/Plan Active Problems:   Hyperglycemia   Sepsis   Encephalopathy   Elevated troponin   Renal failure  1-Sepsis; Patient presents with hypotension, mild leukocytosis, increase lactic acid at  4.5. IV fluids, IV antibiotics to cover for aspiration and health care associated PNA. Unclear when patient was last hospitalized. Blood culture ordered. Repeat Lactic acid.   2-Encephalopathy; this could be multifactorial, metabolic secondary to hyperglycemia, electrolytes abnormalities. 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 Will order MRI brain. Check ammonia level. Frequent neuro exam.   3-Elevated troponin; multifactorial renal failure, sepsis, but patient with prior history of cardiomyopathy, (per family Heart working 20 %), will  Consult cardiology. Continue to cycle troponin.   4-Renal Failure, prior history of CKD. Unknown baseline. Will request records.  5-Diabetes, uncontrolled diabetes. Patient missed insulin dose today. Will start lower dose lantus, patient npo due to AMS. Will order SSI.    Code Status: full code.  Family Communication: Care discussed with son who was at bedside.  Disposition Plan: expect  more than 2 night inpatient.   Time spent: 75 minutes.   [facility] Pager [phone]  Disclaimer: This note may have been dictated with voice recognition software. Similar sounding words can inadvertently be transcribed and this note may contain transcription errors which may not have been corrected upon publication of note.

## 2014-04-02 NOTE — ED Notes (Signed)
Cardiology MD at bedside.

## 2014-04-02 NOTE — Progress Notes (Signed)
  Echocardiogram 2D Echocardiogram has been performed.  Carney Corners 04/02/2014, 4:35 PM

## 2014-04-02 NOTE — Progress Notes (Signed)
Patient admitted after midnight.  Please see H&P.  Patient oriented to name and place and time but sleepy  Sepsis; Patient presents with hypotension, mild leukocytosis, increase lactic acid at 4.5. IV fluids, IV antibiotics to cover for aspiration and health care associated PNA. Unclear when patient was last hospitalized. Blood culture ordered. Repeat Lactic acid.   Encephalopathy; this could be multifactorial, metabolic secondary to hyperglycemia, electrolytes abnormalities. 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 Will order MRI brain. Check ammonia level. Frequent neuro exam.   Elevated troponin; multifactorial renal failure, sepsis, but patient with prior history of cardiomyopathy, (per family Heart working 20 %),  -cardiology -cycle troponin.  Echo from [place]: 2015: from [facility] The left ventricle is normal in size. There is mild concentric left ventricular hypertrophy.The left ventricular ejection fraction is severely reduced (<25%). There is severe diffuse hypokinesis of the left ventricle. There is severe (Grade IV/IV) diastolic dysfunction; irreversible restrictive pattern of mitral inflow. There is also a cardiac cath from 2014  Acute Renal Failure, prior history of CKD.  Baseline Cr is 2.1 from 5/19 -will consider consult renal as hesitant to give IVF with EF of <25% if not better in AM- was hypotensive, avoid nephrotoxic agents -renal U/S  Diabetes, uncontrolled diabetes. Patient missed insulin dose today. Will start lower dose lantus, patient npo due to AMS. Will order SSI.   Eulogio Bear DO

## 2014-04-02 NOTE — ED Notes (Signed)
CBG Taken = 391

## 2014-04-02 NOTE — Progress Notes (Signed)
Patient transported from ER via stretcher on tele by ER RN. Patient appears fatigued, oriented x 3. Oriented patient to unit and room, instructed on callbell and placed at side. Discussed fall prevention precautions. Patient's son, Tony Freeman, at bedside. Will continue to monitor.

## 2014-04-03 ENCOUNTER — Inpatient Hospital Stay (HOSPITAL_COMMUNITY): Payer: Medicare HMO

## 2014-04-03 LAB — BASIC METABOLIC PANEL WITH GFR
BUN: 65 mg/dL — ABNORMAL HIGH (ref 6–23)
CO2: 26 meq/L (ref 19–32)
Calcium: 8.7 mg/dL (ref 8.4–10.5)
Chloride: 88 meq/L — ABNORMAL LOW (ref 96–112)
Creatinine, Ser: 4.95 mg/dL — ABNORMAL HIGH (ref 0.50–1.35)
GFR calc Af Amer: 13 mL/min — ABNORMAL LOW
GFR calc non Af Amer: 11 mL/min — ABNORMAL LOW
Glucose, Bld: 464 mg/dL — ABNORMAL HIGH (ref 70–99)
Potassium: 3.4 meq/L — ABNORMAL LOW (ref 3.7–5.3)
Sodium: 132 meq/L — ABNORMAL LOW (ref 137–147)

## 2014-04-03 LAB — BASIC METABOLIC PANEL
BUN: 64 mg/dL — AB (ref 6–23)
CALCIUM: 8.5 mg/dL (ref 8.4–10.5)
CO2: 25 mEq/L (ref 19–32)
CREATININE: 4.85 mg/dL — AB (ref 0.50–1.35)
Chloride: 96 mEq/L (ref 96–112)
GFR calc non Af Amer: 12 mL/min — ABNORMAL LOW (ref >90)
GFR, EST AFRICAN AMERICAN: 14 mL/min — AB (ref >90)
Glucose, Bld: 261 mg/dL — ABNORMAL HIGH (ref 70–99)
Potassium: 3.9 mEq/L (ref 3.7–5.3)
Sodium: 135 mEq/L — ABNORMAL LOW (ref 137–147)

## 2014-04-03 LAB — TSH: TSH: 0.415 u[IU]/mL (ref 0.350–4.500)

## 2014-04-03 LAB — GLUCOSE, CAPILLARY
GLUCOSE-CAPILLARY: 206 mg/dL — AB (ref 70–99)
Glucose-Capillary: 439 mg/dL — ABNORMAL HIGH (ref 70–99)
Glucose-Capillary: 243 mg/dL — ABNORMAL HIGH (ref 70–99)
Glucose-Capillary: 263 mg/dL — ABNORMAL HIGH (ref 70–99)
Glucose-Capillary: 312 mg/dL — ABNORMAL HIGH (ref 70–99)

## 2014-04-03 LAB — CBC
HCT: 40.3 % (ref 39.0–52.0)
HEMOGLOBIN: 13.6 g/dL (ref 13.0–17.0)
MCH: 30.2 pg (ref 26.0–34.0)
MCHC: 33.7 g/dL (ref 30.0–36.0)
MCV: 89.6 fL (ref 78.0–100.0)
Platelets: 166 10*3/uL (ref 150–400)
RBC: 4.5 MIL/uL (ref 4.22–5.81)
RDW: 12.8 % (ref 11.5–15.5)
WBC: 8.5 10*3/uL (ref 4.0–10.5)

## 2014-04-03 LAB — HEPARIN LEVEL (UNFRACTIONATED): Heparin Unfractionated: 0.81 IU/mL — ABNORMAL HIGH (ref 0.30–0.70)

## 2014-04-03 LAB — HEMOGLOBIN A1C
Hgb A1c MFr Bld: 11.7 % — ABNORMAL HIGH
Mean Plasma Glucose: 289 mg/dL — ABNORMAL HIGH

## 2014-04-03 LAB — MAGNESIUM: MAGNESIUM: 1.5 mg/dL (ref 1.5–2.5)

## 2014-04-03 MED ORDER — HEPARIN BOLUS VIA INFUSION
2000.0000 [IU] | Freq: Once | INTRAVENOUS | Status: AC
  Administered 2014-04-03: 2000 [IU] via INTRAVENOUS
  Filled 2014-04-03: qty 2000

## 2014-04-03 MED ORDER — SODIUM CHLORIDE 0.9 % IV BOLUS (SEPSIS)
500.0000 mL | Freq: Once | INTRAVENOUS | Status: AC
  Administered 2014-04-03: 500 mL via INTRAVENOUS

## 2014-04-03 MED ORDER — INSULIN ASPART 100 UNIT/ML ~~LOC~~ SOLN
0.0000 [IU] | Freq: Three times a day (TID) | SUBCUTANEOUS | Status: DC
  Administered 2014-04-03: 8 [IU] via SUBCUTANEOUS
  Administered 2014-04-03 – 2014-04-04 (×4): 5 [IU] via SUBCUTANEOUS
  Administered 2014-04-04: 8 [IU] via SUBCUTANEOUS
  Administered 2014-04-05: 2 [IU] via SUBCUTANEOUS
  Administered 2014-04-06: 13:00:00 via SUBCUTANEOUS
  Administered 2014-04-06: 2 [IU] via SUBCUTANEOUS
  Administered 2014-04-07: 3 [IU] via SUBCUTANEOUS

## 2014-04-03 MED ORDER — INSULIN GLARGINE 100 UNIT/ML ~~LOC~~ SOLN
40.0000 [IU] | Freq: Every day | SUBCUTANEOUS | Status: DC
  Administered 2014-04-03: 40 [IU] via SUBCUTANEOUS
  Filled 2014-04-03 (×2): qty 0.4

## 2014-04-03 MED ORDER — INSULIN ASPART 100 UNIT/ML ~~LOC~~ SOLN
20.0000 [IU] | Freq: Once | SUBCUTANEOUS | Status: AC
  Administered 2014-04-03: 20 [IU] via SUBCUTANEOUS

## 2014-04-03 MED ORDER — POTASSIUM CHLORIDE CRYS ER 20 MEQ PO TBCR
40.0000 meq | EXTENDED_RELEASE_TABLET | Freq: Once | ORAL | Status: AC
  Administered 2014-04-03: 40 meq via ORAL
  Filled 2014-04-03: qty 2

## 2014-04-03 MED ORDER — INSULIN ASPART 100 UNIT/ML ~~LOC~~ SOLN
5.0000 [IU] | Freq: Three times a day (TID) | SUBCUTANEOUS | Status: DC
  Administered 2014-04-03 – 2014-04-04 (×3): 5 [IU] via SUBCUTANEOUS

## 2014-04-03 MED ORDER — SODIUM CHLORIDE 0.9 % IV SOLN
INTRAVENOUS | Status: DC
  Administered 2014-04-03 – 2014-04-04 (×2): via INTRAVENOUS

## 2014-04-03 MED ORDER — HEPARIN (PORCINE) IN NACL 100-0.45 UNIT/ML-% IJ SOLN
1250.0000 [IU]/h | INTRAMUSCULAR | Status: DC
  Administered 2014-04-03: 1100 [IU]/h via INTRAVENOUS
  Administered 2014-04-04: 950 [IU]/h via INTRAVENOUS
  Administered 2014-04-05: 1150 [IU]/h via INTRAVENOUS
  Administered 2014-04-05: 1050 [IU]/h via INTRAVENOUS
  Administered 2014-04-06: 1150 [IU]/h via INTRAVENOUS
  Administered 2014-04-06: 1250 [IU]/h via INTRAVENOUS
  Filled 2014-04-03 (×7): qty 250

## 2014-04-03 MED ORDER — PNEUMOCOCCAL VAC POLYVALENT 25 MCG/0.5ML IJ INJ
0.5000 mL | INJECTION | INTRAMUSCULAR | Status: AC
  Administered 2014-04-04: 0.5 mL via INTRAMUSCULAR
  Filled 2014-04-03: qty 0.5

## 2014-04-03 MED ORDER — METOPROLOL TARTRATE 1 MG/ML IV SOLN
5.0000 mg | Freq: Once | INTRAVENOUS | Status: AC
  Administered 2014-04-03: 5 mg via INTRAVENOUS
  Filled 2014-04-03: qty 5

## 2014-04-03 MED ORDER — MAGNESIUM SULFATE 40 MG/ML IJ SOLN
2.0000 g | Freq: Once | INTRAMUSCULAR | Status: AC
  Administered 2014-04-03: 2 g via INTRAVENOUS
  Filled 2014-04-03: qty 50

## 2014-04-03 MED ORDER — INSULIN ASPART 100 UNIT/ML ~~LOC~~ SOLN
0.0000 [IU] | Freq: Every day | SUBCUTANEOUS | Status: DC
  Administered 2014-04-03: 4 [IU] via SUBCUTANEOUS

## 2014-04-03 NOTE — Progress Notes (Signed)
ANTICOAGULATION CONSULT NOTE - Follow-up  Pharmacy Consult for heparin Indication: atrial fibrillation  No Known Allergies  Patient Measurements: Height: 5\' 6"  (167.6 cm) Weight: 207 lb 0.2 oz (93.9 kg) IBW/kg (Calculated) : 63.8 Heparin Dosing Weight: 76 kg  Vital Signs: Temp: 97.8 F (36.6 C) (06/08 1527) Temp src: Oral (06/08 1527) BP: 114/83 mmHg (06/08 1527) Pulse Rate: 86 (06/08 1527)  Labs:  Recent Labs  04/01/14 2300 04/02/14 0130 04/02/14 0600 04/02/14 1256 04/03/14 0234 04/03/14 1120 04/03/14 1430  HGB 14.9  --  13.7  --  13.6  --   --   HCT 43.2  --  39.3  --  40.3  --   --   PLT 188  --  157  --  166  --   --   HEPARINUNFRC  --   --   --   --   --   --  0.81*  CREATININE 3.63*  --  4.05*  --  4.95* 4.85*  --   TROPONINI  --  <0.30 <0.30 <0.30  --   --   --     Estimated Creatinine Clearance: 17.1 ml/min (by C-G formula based on Cr of 4.85).  Assessment: 49 yom continues on IV heparin for afib. Initial heparin level is supratherapeutic at 0.81. Per RN, pt is not having any bleeding side effects.   Goal of Therapy:  Heparin level 0.3-0.7 units/ml Monitor platelets by anticoagulation protocol: Yes   Plan:  1. Reduce heparin gtt to 950 units/hr 2. Check an 8 hour heparin level to assess new dose  Salome Arnt, PharmD, BCPS Pager # [phone] 04/03/2014 3:59 PM

## 2014-04-03 NOTE — Evaluation (Signed)
Clinical/Bedside Swallow Evaluation Patient Details  Name: Tony Freeman MRN: [id number] Date of Birth: [birth year]  Today's Date: 04/03/2014 Time: 1030-1040 SLP Time Calculation (min): 10 min  Past Medical History:  Past Medical History  Diagnosis Date  . Diabetes mellitus without complication   . CHF (congestive heart failure)   . Kidney failure   . CAD (coronary artery disease)   . Hyperlipidemia   . CVA (cerebral infarction)   . Stroke   . Dementia    Past Surgical History:  Past Surgical History  Procedure Laterality Date  . Coronary stent placement     HPI:  Tony Freeman is a 63 y.o. male with PMH significant for CKD, Heart failure, cardiomyopathy, he gets his care at [facility] who was brought to the ED by son due to AMS. Pt admitted with sepsis. CXR negative, MRI pending. 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    Assessment / Plan / Recommendation Clinical Impression  Pt demonstrates normal swallow function. No evidence of aspiration, pt has been tolerating diet well. Continue regular/thin. No SLP f/u needed.     Aspiration Risk  Mild    Diet Recommendation Regular;Thin liquid   Medication Administration: Whole meds with liquid Supervision: Patient able to self feed Postural Changes and/or Swallow Maneuvers: Seated upright 90 degrees    Other  Recommendations Oral Care Recommendations: Oral care BID   Follow Up Recommendations  None    Frequency and Duration        Pertinent Vitals/Pain NA    SLP Swallow Goals     Swallow Study Prior Functional Status       General HPI: Tony Freeman is a 63 y.o. male with PMH significant for CKD, Heart failure, cardiomyopathy, he gets his care at [facility] who was brought to the ED by son due to AMS. Pt admitted with sepsis. CXR negative, MRI pending. 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  Type of Study: Bedside swallow evaluation Diet Prior to this Study: Regular;Thin liquids Temperature Spikes Noted: No Respiratory Status: Room air History of Recent Intubation: No Behavior/Cognition: Alert;Cooperative;Pleasant mood Oral Cavity - Dentition: Adequate natural dentition Self-Feeding Abilities: Able to feed self Patient Positioning: Upright in bed Baseline Vocal Quality: Clear Volitional Cough: Strong Volitional Swallow: Able to elicit    Oral/Motor/Sensory Function Overall Oral Motor/Sensory Function: Appears within functional limits for tasks assessed   Ice Chips     Thin Liquid Thin Liquid: Within functional limits    Nectar Thick Nectar Thick Liquid: Not tested   Honey Thick Honey Thick Liquid: Not tested   Puree Puree: Within functional limits   Solid   GO    Solid: Within functional limits      [facility], [place] CCC-SLP [phone]  Tony Freeman Tony Freeman 04/03/2014,1:23 PM

## 2014-04-03 NOTE — Progress Notes (Signed)
Utilization review completed.  

## 2014-04-03 NOTE — Plan of Care (Cosign Needed)
Paged re:NEW ONSET AF/RVR and occ bursts of WCT. CHADVasc 5 so likely will need anticoagulation-was on high dose BB pre admit. Pt admitted with hyperglycemia due to missed insulin doses and associated ARF and DH. Has history of CAD (Multivessel coronary intervention in 2007 and 2009. Ischemic cardiomyopathy PCI LAD/ RCA in 2007 by Dr. Dwyane Dee. PCI LAD/RCA in 2009 at [place] PCI LAD 08/2013. Presented with unstable angina pectoris.. Coronary angio demonstrated proximal and distal LAD lesions.2.75/20 PROMUS stent in proximal LAD// PTCA of distal RCA ISR Proximal RCA 100%) who is followed by [facility] reviewed and note pt baseline BUN/Scr 28/2.1- presented here with BUN/Scr 55/4.05. Cards following this admit- per ECHO at [facility] with EF 25-30% w/DD and severe pulm HTN. ECHO here same. Since admit IVFs have been dc'd and CBGs poorly controlled at >300. On exam lungs are CTA and he is in AF with rates up to 130s assoc w/ jaw pain-EKG without apparent ischemic changes. Orders given for 500 cc NS bolus and resume IVF at 75/hr then give 5 mg IV Lopressor (reluctant to begin CCB in setting systolic dysfunction)-also have adjusted SSI and stat CBC, BMET and Mg pending. Current CBG 439 so will give 20 units coverage and calculate AG in event needs insulin drip   Addendum 340 AM: Renal function has worsened-will increase IVFs to 125/hr for now and give an additional 500 cc NS bolus-BP is soft 93/56 and HR has decreased slightly since initial fluid challenge-repeat labs at 12 pm- no evidence of DKA (AG 18 with normal pCO2 and suspect has acidosis 2/2 DH and ARF). TSH 0.415-renal US still pending-given low EF volume expansion should help minimize further hypotension related renal injury-also since no obvious sign of infection, no fever or leukocytosis and no source will dc Zosyn and Vancomycin since can worsen renal function. Magnesium low normal at 1.5 so will  replete since noted with WCT in setting of low EF- wish to prevent torsades-K 3.4 so will replete this too  604 am: AF persists and CHADVAsc 5 so will ask pharmacy to dose Heparin and ask CM to determine copay for NOAC-decrease IVFs back to 75 and ck PCXR  Erin Hearing ANP

## 2014-04-03 NOTE — Progress Notes (Signed)
Nutrition Brief Note  Patient identified on the Malnutrition Screening Tool (MST) Report. Patient reports usual weight ~225 lb. He is surprised that he has lost weight. Reports good appetite and good oral intake PTA and now.   Wt Readings from Last 15 Encounters:  04/03/14 207 lb 0.2 oz (93.9 kg)    Body mass index is 33.43 kg/(m^2). Patient meets criteria for obesity, class 1 based on current BMI.   Current diet order is CHO-modified, patient is consuming approximately 100% of meals at this time. Labs and medications reviewed.   No nutrition interventions warranted at this time. If nutrition issues arise, please consult RD.   Molli Barrows, RD, LDN, [place] Pager [phone] After Hours Pager [phone]

## 2014-04-03 NOTE — Progress Notes (Signed)
PROGRESS NOTE  Tony Freeman GGY:[id number] DOB: Aug 12, 1951 DOA: 04/01/2014 PCP: No primary provider on file.  Assessment/Plan: Lactic acidosis- IVF; repeat shows resolution  Encephalopathy; this could be multifactorial, metabolic secondary to hyperglycemia, electrolytes abnormalities. 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 Will order MRI brain.   Elevated troponin; multifactorial renal failure, sepsis, but patient with prior history of cardiomyopathy, (per family Heart working 20 %),  -cardiology consult -cycle troponin.  Echo from [place]: 2015: from [facility]  The left ventricle is normal in size. There is mild concentric left ventricular hypertrophy.The left ventricular ejection fraction is severely reduced (<25%). There is severe diffuse hypokinesis of the left ventricle. There is severe (Grade IV/IV) diastolic dysfunction; irreversible restrictive pattern of mitral inflow.  There is also a cardiac cath from 2014   Atrial fib- do not see history in records from [facility] -cars consult -heparin gtt started -BP low  Acute Renal Failure, prior history of CKD.  Baseline Cr is 2.1 from 5/19  -will  consult renal as hesitant to give too much IVF with EF of <25% if not better in AM- was hypotensive so most like hypoperfuation, avoid nephrotoxic agents  -renal U/S pending  Diabetes, uncontrolled diabetes. -lower dose lantus, -SSI  hypomagnesium -repleted  Leukocytosis -resolved  Code Status: full Family Communication: patient Disposition Plan:    Consultants:  nephro  cards  Procedures:      HPI/Subjective: Went into a fib last PM Much more awake and alert this AM says not urinating much  Objective: Filed Vitals:   04/03/14 0808  BP: 91/61  Pulse: 50  Temp: 98 F (36.7 C)  Resp: 19    Intake/Output Summary (Last 24 hours) at 04/03/14 0846 Last data filed at 04/03/14 0809  Gross per 24 hour  Intake 1743.97  ml  Output    125 ml  Net 1618.97 ml   Filed Weights   04/01/14 2257 04/02/14 0230 04/03/14 0336  Weight: 99.791 kg (220 lb) 90.1 kg (198 lb 10.2 oz) 93.9 kg (207 lb 0.2 oz)    Exam:   General:  A+Ox3, NAD  Cardiovascular: irr  Respiratory: clear, no wheezing  Abdomen: +BS, soft  Musculoskeletal: moves all 4 ext, no edema   Data Reviewed: Basic Metabolic Panel:  Recent Labs Lab 04/01/14 2300 04/02/14 0600 04/03/14 0234  NA 131* 133* 132*  K 3.6* 3.8 3.4*  CL 86* 88* 88*  CO2 27 31 26   GLUCOSE 529* 334* 464*  BUN 54* 58* 65*  CREATININE 3.63* 4.05* 4.95*  CALCIUM 9.8 9.6 8.7  MG  --   --  1.5   Liver Function Tests:  Recent Labs Lab 04/01/14 2300  AST 34  ALT 35  ALKPHOS 173*  BILITOT 1.8*  PROT 7.6  ALBUMIN 3.4*   No results found for this basename: LIPASE, AMYLASE,  in the last 168 hours  Recent Labs Lab 04/02/14 0143  AMMONIA 28   CBC:  Recent Labs Lab 04/01/14 2300 04/02/14 0600 04/03/14 0234  WBC 11.3* 9.1 8.5  HGB 14.9 13.7 13.6  HCT 43.2 39.3 40.3  MCV 88.7 88.5 89.6  PLT 188 157 166   Cardiac Enzymes:  Recent Labs Lab 04/02/14 0130 04/02/14 0600 04/02/14 1256  TROPONINI <0.30 <0.30 <0.30   BNP (last 3 results) No results found for this basename: PROBNP,  in the last 8760 hours CBG:  Recent Labs Lab 04/02/14 1616 04/02/14 1943 04/02/14 2217 04/03/14 0133 04/03/14 0811  GLUCAP 303* 343*  337* 439* 243*    Recent Results (from the past 240 hour(s))  CULTURE, BLOOD (ROUTINE X 2)     Status: None   Collection Time    04/02/14 12:17 AM      Result Value Ref Range Status   Specimen Description BLOOD RIGHT ARM   Final   Special Requests     Final   Value: BOTTLES DRAWN AEROBIC AND ANAEROBIC 10CC Immunocompromised   Culture  Setup Time     Final   Value: 04/02/2014 13:44     Performed at [facility]   Culture     Final   Value:        BLOOD CULTURE RECEIVED NO GROWTH TO DATE CULTURE WILL BE HELD FOR 5  DAYS BEFORE ISSUING A FINAL NEGATIVE REPORT     Performed at [facility]   Report Status PENDING   Incomplete  CULTURE, BLOOD (ROUTINE X 2)     Status: None   Collection Time    04/02/14 12:31 AM      Result Value Ref Range Status   Specimen Description BLOOD LEFT SHOULDER   Final   Special Requests BOTTLES DRAWN AEROBIC ONLY 5CC Immunocompromised   Final   Culture  Setup Time     Final   Value: 04/02/2014 13:44     Performed at [facility]   Culture     Final   Value:        BLOOD CULTURE RECEIVED NO GROWTH TO DATE CULTURE WILL BE HELD FOR 5 DAYS BEFORE ISSUING A FINAL NEGATIVE REPORT     Performed at [facility]   Report Status PENDING   Incomplete  MRSA PCR SCREENING     Status: None   Collection Time    04/02/14  3:27 AM      Result Value Ref Range Status   MRSA by PCR NEGATIVE  NEGATIVE Final   Comment:            The GeneXpert MRSA Assay (FDA     approved for NASAL specimens     only), is one component of a     comprehensive MRSA colonization     surveillance program. It is not     intended to diagnose MRSA     infection nor to guide or     monitor treatment for     MRSA infections.     Studies: Ct Head Wo Contrast  04/02/2014   CLINICAL DATA:  Weakness and dizziness.  EXAM: CT HEAD WITHOUT CONTRAST  TECHNIQUE: Contiguous axial images were obtained from the base of the skull through the vertex without intravenous contrast.  COMPARISON:  None.  FINDINGS: The ventricles are in the midline and demonstrate normal configuration. No mass effect or shift. No extra-axial fluid collections are identified. Focal area of low attenuation in the left thymic area is likely an age indeterminate lacunar type infarct. No hemorrhage. No findings for hemispheric infarction. No mass lesions. The brainstem and cerebellum are grossly normal.  The bony structures are unremarkable. The paranasal sinuses and mastoid air cells are clear.  IMPRESSION: Age indeterminate left  the laminae lacunar type infarct.  No findings for hemispheric infarction or intracranial hemorrhage.   Electronically Signed   By: Kalman Jewels M.D.   On: 04/02/2014 00:13   Dg Chest Port 1 View  04/03/2014   CLINICAL DATA:  Hypoxia.  EXAM: PORTABLE CHEST - 1 VIEW  COMPARISON:  Chest x-ray 04/01/2014.  FINDINGS:  Mediastinum and hilar structures are normal. Stable cardiomegaly with normal pulmonary vascularity. No pleural effusion or pneumothorax. No acute osseous abnormality .  IMPRESSION: 1. Stable cardiomegaly. 2. No acute cardiopulmonary disease.   Electronically Signed   By: Marcello Moores  Register   On: 04/03/2014 07:50   Dg Chest Port 1 View  04/01/2014   CLINICAL DATA:  Altered mental status; history of coronary artery disease.  EXAM: PORTABLE CHEST - 1 VIEW  COMPARISON:  None.  FINDINGS: The cardiac silhouette is enlarged. A calcified vessel or stent is present over the left cardiac apex. The pulmonary vascularity is not engorged. The lungs are hypoinflated. There is no pleural effusion. There is degenerative change of the left glenohumeral joint. New  IMPRESSION: There is no evidence of aspiration. The cardiac silhouette is enlarged without evidence of pulmonary edema.   Electronically Signed   By: David  Martinique   On: 04/01/2014 23:46    Scheduled Meds: . aspirin EC  81 mg Oral Daily  . atorvastatin  40 mg Oral Daily  . clopidogrel  75 mg Oral Q breakfast  . insulin aspart  0-15 Units Subcutaneous TID WC  . insulin aspart  0-5 Units Subcutaneous QHS  . insulin glargine  30 Units Subcutaneous QHS  . sodium chloride  3 mL Intravenous Q12H   Continuous Infusions: . sodium chloride 75 mL/hr at 04/03/14 0615  . heparin 1,100 Units/hr (04/03/14 0646)   Antibiotics Given (last 72 hours)   Date/Time Action Medication Dose Rate   04/02/14 0904 Given   piperacillin-tazobactam (ZOSYN) IVPB 2.25 g 2.25 g 100 mL/hr   04/02/14 1422 Given   piperacillin-tazobactam (ZOSYN) IVPB 2.25 g 2.25 g 100 mL/hr    04/02/14 2118 Given   piperacillin-tazobactam (ZOSYN) IVPB 2.25 g 2.25 g 100 mL/hr      Active Problems:   Hyperglycemia   Sepsis   Encephalopathy   Elevated troponin   Renal failure    Time spent: 35 min    [employer] [facility] Pager [phone]. If 7PM-7AM, please contact night-coverage at [email or web address], password [facility] 04/03/2014, 8:46 AM  LOS: 2 days

## 2014-04-03 NOTE — Progress Notes (Signed)
Patient converted into Afib with RVR 120-140s. EKG done to confirm. Patient c/o left-sided jaw pain 4/10. SBP 90s. Otis Dials, NP notified. New orders received.

## 2014-04-03 NOTE — Progress Notes (Signed)
ANTICOAGULATION CONSULT NOTE - Initial Consult  Pharmacy Consult for heparin Indication: atrial fibrillation  No Known Allergies  Patient Measurements: Height: 5\' 6"  (167.6 cm) Weight: 207 lb 0.2 oz (93.9 kg) IBW/kg (Calculated) : 63.8 Heparin Dosing Weight: 76 kg  Vital Signs: Temp: 98.5 F (36.9 C) (06/08 0336) Temp src: Oral (06/08 0336) BP: 90/62 mmHg (06/08 0400) Pulse Rate: 109 (06/08 0400)  Labs:  Recent Labs  04/01/14 2300 04/02/14 0130 04/02/14 0600 04/02/14 1256 04/03/14 0234  HGB 14.9  --  13.7  --  13.6  HCT 43.2  --  39.3  --  40.3  PLT 188  --  157  --  166  CREATININE 3.63*  --  4.05*  --  4.95*  TROPONINI  --  <0.30 <0.30 <0.30  --     Estimated Creatinine Clearance: 16.8 ml/min (by C-G formula based on Cr of 4.95).   Medical History: Past Medical History  Diagnosis Date  . Diabetes mellitus without complication   . CHF (congestive heart failure)   . Kidney failure   . CAD (coronary artery disease)   . Hyperlipidemia   . CVA (cerebral infarction)   . Stroke   . Dementia     Medications:  Prescriptions prior to admission  Medication Sig Dispense Refill  . aspirin EC 81 MG tablet Take 81 mg by mouth daily.      Marland Kitchen atorvastatin (LIPITOR) 40 MG tablet Take 40 mg by mouth daily.      . cholecalciferol (VITAMIN D) 1000 UNITS tablet Take 2,000 Units by mouth daily.      . clopidogrel (PLAVIX) 75 MG tablet Take 75 mg by mouth daily with breakfast.      . furosemide (LASIX) 40 MG tablet Take 40 mg by mouth 2 (two) times daily.      . insulin glargine (LANTUS) 100 UNIT/ML injection Inject 40 Units into the skin at bedtime.      . insulin lispro (HUMALOG) 100 UNIT/ML injection Inject 25 Units into the skin 2 (two) times daily with breakfast and lunch.      . isosorbide mononitrate (IMDUR) 60 MG 24 hr tablet Take 60 mg by mouth daily.      . metoprolol (TOPROL-XL) 200 MG 24 hr tablet Take 200 mg by mouth daily.      . Multiple Vitamin (MULTIVITAMIN  WITH MINERALS) TABS tablet Take 1 tablet by mouth daily.        Assessment: 63 yo M to start heparin per pharmacy for new onset AFib with RVR.  Wt 93.9 kg; CBC wnl.   5000 units sq heparin given at 0500 so will give a lower bolus.  Goal of Therapy:  Heparin level 0.3-0.7 units/ml Monitor platelets by anticoagulation protocol: Yes   Plan:  Give 2000 units bolus x 1 Start heparin infusion at 1100 units/hr Check anti-Xa level in 6 hours and daily while on heparin Continue to monitor H&H and platelets F/u plans for transition to oral antithrombotic agent.   Eudelia Bunch, Pharm.D. [phone] 04/03/2014 6:22 AM

## 2014-04-03 NOTE — Progress Notes (Signed)
Pharmacist Heart Failure Core Measure Documentation  Assessment: Tony Freeman has an EF documented as 25-30% on 04/02/14  Rationale: Heart failure patients with left ventricular systolic dysfunction (LVSD) and an EF < 40% should be prescribed an angiotensin converting enzyme inhibitor (ACEI) or angiotensin receptor blocker (ARB) at discharge unless a contraindication is documented in the medical record.  This patient is not currently on an ACEI or ARB for HF.  This note is being placed in the record in order to provide documentation that a contraindication to the use of these agents is present for this encounter.  ACE Inhibitor or Angiotensin Receptor Blocker is contraindicated (specify all that apply)  []   ACEI allergy AND ARB allergy []   Angioedema []   Moderate or severe aortic stenosis []   Hyperkalemia []   Hypotension []   Renal artery stenosis [x]   Worsening renal function, preexisting renal disease or dysfunction   Tony Freeman Tony Freeman 04/03/2014 10:42 AM

## 2014-04-03 NOTE — Consult Note (Signed)
The patient is a 63 year old male with a long history of diabetes mellitus and stage 4 CKD followed by Dr. Zenia Resides in [facility]. He is new to this hospital. He apparently has a history of cardiomyopathy and CAD with stenting. He was brought to the ED by his son for evaluation of AMS. He came to visit his son, he forgot his insulin, became confused, somnolent and started to choke. His BS at home was high. He was brought to the ED.  He was not having chest pain or overt cardiac complaints.   Creat was 4.05, BS 355. Lactic acid was elevated. Head CT demonstrated an age indeterminate left lacunar infarct. The patient has had Afib with RVR(128) and hypotension(90/57).   During the course of the hospitalization the creatinine has increased to 4.95 today and BS 464.  Baseline creatinine on 11/18/13 is 2.64m/dl.  Urine output has been poor.  Past Medical History  Diagnosis Date  . Diabetes mellitus without complication   . CHF (congestive heart failure)   . Kidney failure   . CAD (coronary artery disease)   . Hyperlipidemia   . CVA (cerebral infarction)   . Stroke   . Dementia    Past Surgical History  Procedure Laterality Date  . Coronary stent placement     Social History:  reports that he has never smoked. He does not have any smokeless tobacco history on file. He reports that he does not use illicit drugs. His alcohol history is not on file. Allergies: No Known Allergies Family History  Problem Relation Age of Onset  . Heart disease Mother     Medications:  Prior to Admission:  Prescriptions prior to admission  Medication Sig Dispense Refill  . aspirin EC 81 MG tablet Take 81 mg by mouth daily.      .Marland Kitchenatorvastatin (LIPITOR) 40 MG tablet Take 40 mg by mouth daily.      . cholecalciferol (VITAMIN D) 1000 UNITS tablet Take 2,000 Units by mouth daily.      . clopidogrel (PLAVIX) 75 MG tablet Take 75 mg by mouth daily with breakfast.      . furosemide (LASIX) 40 MG tablet Take 40 mg by  mouth 2 (two) times daily.      . insulin glargine (LANTUS) 100 UNIT/ML injection Inject 40 Units into the skin at bedtime.      . insulin lispro (HUMALOG) 100 UNIT/ML injection Inject 25 Units into the skin 2 (two) times daily with breakfast and lunch.      . isosorbide mononitrate (IMDUR) 60 MG 24 hr tablet Take 60 mg by mouth daily.      . metoprolol (TOPROL-XL) 200 MG 24 hr tablet Take 200 mg by mouth daily.      . Multiple Vitamin (MULTIVITAMIN WITH MINERALS) TABS tablet Take 1 tablet by mouth daily.       Scheduled: . aspirin EC  81 mg Oral Daily  . atorvastatin  40 mg Oral Daily  . clopidogrel  75 mg Oral Q breakfast  . insulin aspart  0-15 Units Subcutaneous TID WC  . insulin aspart  0-5 Units Subcutaneous QHS  . insulin aspart  5 Units Subcutaneous TID WC  . insulin glargine  40 Units Subcutaneous QHS  . [START ON 04/04/2014] pneumococcal 23 valent vaccine  0.5 mL Intramuscular Tomorrow-1000  . sodium chloride  3 mL Intravenous Q12H     ROS: Reports does not always check his blood sugar  Blood pressure 114/83, pulse 86, temperature 97.8 F (36.6 C), temperature source Oral, resp. rate 18, height 5' 6"  (1.676 m), weight 93.9 kg (207 lb 0.2 oz), SpO2 98.00%.  General appearance: alert and cooperative Head: Normocephalic, without obvious abnormality, atraumatic Eyes: conjunctivae/corneas clear. PERRL, EOM's intact. Fundi benign. Ears: normal TM's and external ear canals both ears Resp: clear to auscultation bilaterally Chest wall: no tenderness Cardio: irregularly irregular rhythm GI: soft, non-tender; bowel sounds normal; no masses,  no organomegaly Extremities: edema tr Skin: Skin color, texture, turgor normal. No rashes or lesions Neurologic: Grossly normal Results for orders placed during the hospital encounter of 04/01/14 (from the past 48 hour(s))  CBG MONITORING, ED     Status: Abnormal   Collection Time    04/01/14 10:46 PM      Result Value Ref Range    Glucose-Capillary 487 (*) 70 - 99 mg/dL  CBC     Status: Abnormal   Collection Time    04/01/14 11:00 PM      Result Value Ref Range   WBC 11.3 (*) 4.0 - 10.5 K/uL   RBC 4.87  4.22 - 5.81 MIL/uL   Hemoglobin 14.9  13.0 - 17.0 g/dL   HCT 43.2  39.0 - 52.0 %   MCV 88.7  78.0 - 100.0 fL   MCH 30.6  26.0 - 34.0 pg   MCHC 34.5  30.0 - 36.0 g/dL   RDW 12.5  11.5 - 15.5 %   Platelets 188  150 - 400 K/uL  COMPREHENSIVE METABOLIC PANEL     Status: Abnormal   Collection Time    04/01/14 11:00 PM      Result Value Ref Range   Sodium 131 (*) 137 - 147 mEq/L   Potassium 3.6 (*) 3.7 - 5.3 mEq/L   Chloride 86 (*) 96 - 112 mEq/L   CO2 27  19 - 32 mEq/L   Glucose, Bld 529 (*) 70 - 99 mg/dL   BUN 54 (*) 6 - 23 mg/dL   Creatinine, Ser 3.63 (*) 0.50 - 1.35 mg/dL   Calcium 9.8  8.4 - 10.5 mg/dL   Total Protein 7.6  6.0 - 8.3 g/dL   Albumin 3.4 (*) 3.5 - 5.2 g/dL   AST 34  0 - 37 U/L   ALT 35  0 - 53 U/L   Alkaline Phosphatase 173 (*) 39 - 117 U/L   Total Bilirubin 1.8 (*) 0.3 - 1.2 mg/dL   GFR calc non Af Amer 17 (*) >90 mL/min   GFR calc Af Amer 19 (*) >90 mL/min   Comment: (NOTE)     The eGFR has been calculated using the CKD EPI equation.     This calculation has not been validated in all clinical situations.     eGFR's persistently <90 mL/min signify possible Chronic Kidney     Disease.  URINALYSIS, ROUTINE W REFLEX MICROSCOPIC     Status: Abnormal   Collection Time    04/01/14 11:45 PM      Result Value Ref Range   Color, Urine YELLOW  YELLOW   APPearance CLOUDY (*) CLEAR   Specific Gravity, Urine 1.028  1.005 - 1.030   pH 5.0  5.0 - 8.0   Glucose, UA >1000 (*) NEGATIVE mg/dL   Hgb urine dipstick MODERATE (*) NEGATIVE   Bilirubin Urine NEGATIVE  NEGATIVE   Ketones, ur NEGATIVE  NEGATIVE mg/dL   Protein, ur >300 (*) NEGATIVE mg/dL   Urobilinogen, UA 0.2  0.0 -  1.0 mg/dL   Nitrite NEGATIVE  NEGATIVE   Leukocytes, UA NEGATIVE  NEGATIVE  URINE MICROSCOPIC-ADD ON     Status:  Abnormal   Collection Time    04/01/14 11:45 PM      Result Value Ref Range   Squamous Epithelial / LPF RARE  RARE   WBC, UA 3-6  <3 WBC/hpf   RBC / HPF 7-10  <3 RBC/hpf   Bacteria, UA FEW (*) RARE   Urine-Other AMORPHOUS URATES/PHOSPHATES    Randolm Idol, ED     Status: Abnormal   Collection Time    04/01/14 11:47 PM      Result Value Ref Range   Troponin i, poc 0.11 (*) 0.00 - 0.08 ng/mL   Comment NOTIFIED PHYSICIAN     Comment 3            Comment: Due to the release kinetics of cTnI,     a negative result within the first hours     of the onset of symptoms does not rule out     myocardial infarction with certainty.     If myocardial infarction is still suspected,     repeat the test at appropriate intervals.  I-STAT CG4 LACTIC ACID, ED     Status: Abnormal   Collection Time    04/01/14 11:50 PM      Result Value Ref Range   Lactic Acid, Venous 4.53 (*) 0.5 - 2.2 mmol/L  CULTURE, BLOOD (ROUTINE X 2)     Status: None   Collection Time    04/02/14 12:17 AM      Result Value Ref Range   Specimen Description BLOOD RIGHT ARM     Special Requests       Value: BOTTLES DRAWN AEROBIC AND ANAEROBIC 10CC Immunocompromised   Culture  Setup Time       Value: 04/02/2014 13:44     Performed at [facility]   Culture       Value:        BLOOD CULTURE RECEIVED NO GROWTH TO DATE CULTURE WILL BE HELD FOR 5 DAYS BEFORE ISSUING A FINAL NEGATIVE REPORT     Performed at [facility]   Report Status PENDING    KETONES, QUALITATIVE     Status: None   Collection Time    04/02/14 12:17 AM      Result Value Ref Range   Acetone, Bld NEGATIVE  NEGATIVE  ETHANOL     Status: None   Collection Time    04/02/14 12:17 AM      Result Value Ref Range   Alcohol, Ethyl (B) <11  0 - 11 mg/dL   Comment:            LOWEST DETECTABLE LIMIT FOR     SERUM ALCOHOL IS 11 mg/dL     FOR MEDICAL PURPOSES ONLY  CULTURE, BLOOD (ROUTINE X 2)     Status: None   Collection Time    04/02/14  12:31 AM      Result Value Ref Range   Specimen Description BLOOD LEFT SHOULDER     Special Requests BOTTLES DRAWN AEROBIC ONLY 5CC Immunocompromised     Culture  Setup Time       Value: 04/02/2014 13:44     Performed at [facility]   Culture       Value:        BLOOD CULTURE RECEIVED NO GROWTH TO DATE CULTURE WILL BE HELD FOR 5 DAYS  BEFORE ISSUING A FINAL NEGATIVE REPORT     Performed at [facility]   Report Status PENDING    CBG MONITORING, ED     Status: Abnormal   Collection Time    04/02/14 12:34 AM      Result Value Ref Range   Glucose-Capillary 391 (*) 70 - 99 mg/dL  LACTIC ACID, PLASMA     Status: Abnormal   Collection Time    04/02/14  1:04 AM      Result Value Ref Range   Lactic Acid, Venous 3.1 (*) 0.5 - 2.2 mmol/L  I-STAT ARTERIAL BLOOD GAS, ED     Status: Abnormal   Collection Time    04/02/14  1:24 AM      Result Value Ref Range   pH, Arterial 7.486 (*) 7.350 - 7.450   pCO2 arterial 38.2  35.0 - 45.0 mmHg   pO2, Arterial 64.0 (*) 80.0 - 100.0 mmHg   Bicarbonate 28.9 (*) 20.0 - 24.0 mEq/L   TCO2 30  0 - 100 mmol/L   O2 Saturation 94.0     Acid-Base Excess 5.0 (*) 0.0 - 2.0 mmol/L   Patient temperature 98.6 F     Collection site RADIAL, ALLEN'S TEST ACCEPTABLE     Sample type ARTERIAL    I-STAT CG4 LACTIC ACID, ED     Status: None   Collection Time    04/02/14  1:28 AM      Result Value Ref Range   Lactic Acid, Venous 1.85  0.5 - 2.2 mmol/L  TROPONIN I     Status: None   Collection Time    04/02/14  1:30 AM      Result Value Ref Range   Troponin I <0.30  <0.30 ng/mL   Comment:            Due to the release kinetics of cTnI,     a negative result within the first hours     of the onset of symptoms does not rule out     myocardial infarction with certainty.     If myocardial infarction is still suspected,     repeat the test at appropriate intervals.  AMMONIA     Status: None   Collection Time    04/02/14  1:43 AM      Result Value  Ref Range   Ammonia 28  11 - 60 umol/L  POCT I-STAT TROPONIN I     Status: None   Collection Time    04/02/14  2:20 AM      Result Value Ref Range   Troponin i, poc 0.05  0.00 - 0.08 ng/mL   Comment 3            Comment: Due to the release kinetics of cTnI,     a negative result within the first hours     of the onset of symptoms does not rule out     myocardial infarction with certainty.     If myocardial infarction is still suspected,     repeat the test at appropriate intervals.  GLUCOSE, CAPILLARY     Status: Abnormal   Collection Time    04/02/14  2:30 AM      Result Value Ref Range   Glucose-Capillary 356 (*) 70 - 99 mg/dL   Comment 1 Notify RN    MRSA PCR SCREENING     Status: None   Collection Time    04/02/14  3:27 AM  Result Value Ref Range   MRSA by PCR NEGATIVE  NEGATIVE   Comment:            The GeneXpert MRSA Assay (FDA     approved for NASAL specimens     only), is one component of a     comprehensive MRSA colonization     surveillance program. It is not     intended to diagnose MRSA     infection nor to guide or     monitor treatment for     MRSA infections.  TROPONIN I     Status: None   Collection Time    04/02/14  6:00 AM      Result Value Ref Range   Troponin I <0.30  <0.30 ng/mL   Comment:            Due to the release kinetics of cTnI,     a negative result within the first hours     of the onset of symptoms does not rule out     myocardial infarction with certainty.     If myocardial infarction is still suspected,     repeat the test at appropriate intervals.  LACTIC ACID, PLASMA     Status: None   Collection Time    04/02/14  6:00 AM      Result Value Ref Range   Lactic Acid, Venous 1.4  0.5 - 2.2 mmol/L  CBC     Status: None   Collection Time    04/02/14  6:00 AM      Result Value Ref Range   WBC 9.1  4.0 - 10.5 K/uL   RBC 4.44  4.22 - 5.81 MIL/uL   Hemoglobin 13.7  13.0 - 17.0 g/dL   HCT 39.3  39.0 - 52.0 %   MCV 88.5  78.0 -  100.0 fL   MCH 30.9  26.0 - 34.0 pg   MCHC 34.9  30.0 - 36.0 g/dL   RDW 12.5  11.5 - 15.5 %   Platelets 157  150 - 400 K/uL  BASIC METABOLIC PANEL     Status: Abnormal   Collection Time    04/02/14  6:00 AM      Result Value Ref Range   Sodium 133 (*) 137 - 147 mEq/L   Potassium 3.8  3.7 - 5.3 mEq/L   Chloride 88 (*) 96 - 112 mEq/L   CO2 31  19 - 32 mEq/L   Glucose, Bld 334 (*) 70 - 99 mg/dL   BUN 58 (*) 6 - 23 mg/dL   Creatinine, Ser 4.05 (*) 0.50 - 1.35 mg/dL   Calcium 9.6  8.4 - 10.5 mg/dL   GFR calc non Af Amer 15 (*) >90 mL/min   GFR calc Af Amer 17 (*) >90 mL/min   Comment: (NOTE)     The eGFR has been calculated using the CKD EPI equation.     This calculation has not been validated in all clinical situations.     eGFR's persistently <90 mL/min signify possible Chronic Kidney     Disease.  GLUCOSE, CAPILLARY     Status: Abnormal   Collection Time    04/02/14  8:59 AM      Result Value Ref Range   Glucose-Capillary 334 (*) 70 - 99 mg/dL   Comment 1 Documented in Chart     Comment 2 Notify RN    GLUCOSE, CAPILLARY     Status: Abnormal   Collection Time  04/02/14 12:54 PM      Result Value Ref Range   Glucose-Capillary 260 (*) 70 - 99 mg/dL  TROPONIN I     Status: None   Collection Time    04/02/14 12:56 PM      Result Value Ref Range   Troponin I <0.30  <0.30 ng/mL   Comment:            Due to the release kinetics of cTnI,     a negative result within the first hours     of the onset of symptoms does not rule out     myocardial infarction with certainty.     If myocardial infarction is still suspected,     repeat the test at appropriate intervals.  GLUCOSE, CAPILLARY     Status: Abnormal   Collection Time    04/02/14  4:16 PM      Result Value Ref Range   Glucose-Capillary 303 (*) 70 - 99 mg/dL   Comment 1 Documented in Chart     Comment 2 Notify RN    GLUCOSE, CAPILLARY     Status: Abnormal   Collection Time    04/02/14  7:43 PM      Result Value Ref  Range   Glucose-Capillary 343 (*) 70 - 99 mg/dL   Comment 1 Notify RN    GLUCOSE, CAPILLARY     Status: Abnormal   Collection Time    04/02/14 10:17 PM      Result Value Ref Range   Glucose-Capillary 337 (*) 70 - 99 mg/dL   Comment 1 Notify RN    GLUCOSE, CAPILLARY     Status: Abnormal   Collection Time    04/03/14  1:33 AM      Result Value Ref Range   Glucose-Capillary 439 (*) 70 - 99 mg/dL   Comment 1 Notify RN     Comment 2 Documented in Chart    TSH     Status: None   Collection Time    04/03/14  2:30 AM      Result Value Ref Range   TSH 0.415  0.350 - 4.500 uIU/mL  BASIC METABOLIC PANEL     Status: Abnormal   Collection Time    04/03/14  2:34 AM      Result Value Ref Range   Sodium 132 (*) 137 - 147 mEq/L   Potassium 3.4 (*) 3.7 - 5.3 mEq/L   Chloride 88 (*) 96 - 112 mEq/L   CO2 26  19 - 32 mEq/L   Glucose, Bld 464 (*) 70 - 99 mg/dL   BUN 65 (*) 6 - 23 mg/dL   Creatinine, Ser 4.95 (*) 0.50 - 1.35 mg/dL   Calcium 8.7  8.4 - 10.5 mg/dL   GFR calc non Af Amer 11 (*) >90 mL/min   GFR calc Af Amer 13 (*) >90 mL/min   Comment: (NOTE)     The eGFR has been calculated using the CKD EPI equation.     This calculation has not been validated in all clinical situations.     eGFR's persistently <90 mL/min signify possible Chronic Kidney     Disease.  CBC     Status: None   Collection Time    04/03/14  2:34 AM      Result Value Ref Range   WBC 8.5  4.0 - 10.5 K/uL   RBC 4.50  4.22 - 5.81 MIL/uL   Hemoglobin 13.6  13.0 - 17.0 g/dL  HCT 40.3  39.0 - 52.0 %   MCV 89.6  78.0 - 100.0 fL   MCH 30.2  26.0 - 34.0 pg   MCHC 33.7  30.0 - 36.0 g/dL   RDW 12.8  11.5 - 15.5 %   Platelets 166  150 - 400 K/uL  MAGNESIUM     Status: None   Collection Time    04/03/14  2:34 AM      Result Value Ref Range   Magnesium 1.5  1.5 - 2.5 mg/dL  GLUCOSE, CAPILLARY     Status: Abnormal   Collection Time    04/03/14  8:11 AM      Result Value Ref Range   Glucose-Capillary 243 (*) 70 - 99  mg/dL   Comment 1 Documented in Chart     Comment 2 Notify RN    BASIC METABOLIC PANEL     Status: Abnormal   Collection Time    04/03/14 11:20 AM      Result Value Ref Range   Sodium 135 (*) 137 - 147 mEq/L   Potassium 3.9  3.7 - 5.3 mEq/L   Chloride 96  96 - 112 mEq/L   CO2 25  19 - 32 mEq/L   Glucose, Bld 261 (*) 70 - 99 mg/dL   BUN 64 (*) 6 - 23 mg/dL   Creatinine, Ser 4.85 (*) 0.50 - 1.35 mg/dL   Calcium 8.5  8.4 - 10.5 mg/dL   GFR calc non Af Amer 12 (*) >90 mL/min   GFR calc Af Amer 14 (*) >90 mL/min   Comment: (NOTE)     The eGFR has been calculated using the CKD EPI equation.     This calculation has not been validated in all clinical situations.     eGFR's persistently <90 mL/min signify possible Chronic Kidney     Disease.  GLUCOSE, CAPILLARY     Status: Abnormal   Collection Time    04/03/14 11:24 AM      Result Value Ref Range   Glucose-Capillary 263 (*) 70 - 99 mg/dL   Comment 1 Documented in Chart     Comment 2 Notify RN    HEPARIN LEVEL (UNFRACTIONATED)     Status: Abnormal   Collection Time    04/03/14  2:30 PM      Result Value Ref Range   Heparin Unfractionated 0.81 (*) 0.30 - 0.70 IU/mL   Comment:            IF HEPARIN RESULTS ARE BELOW     EXPECTED VALUES, AND PATIENT     DOSAGE HAS BEEN CONFIRMED,     SUGGEST FOLLOW UP TESTING     OF ANTITHROMBIN III LEVELS.   Ct Head Wo Contrast  04/02/2014   CLINICAL DATA:  Weakness and dizziness.  EXAM: CT HEAD WITHOUT CONTRAST  TECHNIQUE: Contiguous axial images were obtained from the base of the skull through the vertex without intravenous contrast.  COMPARISON:  None.  FINDINGS: The ventricles are in the midline and demonstrate normal configuration. No mass effect or shift. No extra-axial fluid collections are identified. Focal area of low attenuation in the left thymic area is likely an age indeterminate lacunar type infarct. No hemorrhage. No findings for hemispheric infarction. No mass lesions. The brainstem and  cerebellum are grossly normal.  The bony structures are unremarkable. The paranasal sinuses and mastoid air cells are clear.  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  No findings for hemispheric infarction or intracranial  hemorrhage.   Electronically Signed   By: Kalman Jewels M.D.   On: 04/02/2014 00:13   Dg Chest Port 1 View  04/03/2014   CLINICAL DATA:  Hypoxia.  EXAM: PORTABLE CHEST - 1 VIEW  COMPARISON:  Chest x-ray 04/01/2014.  FINDINGS: Mediastinum and hilar structures are normal. Stable cardiomegaly with normal pulmonary vascularity. No pleural effusion or pneumothorax. No acute osseous abnormality .  IMPRESSION: 1. Stable cardiomegaly. 2. No acute cardiopulmonary disease.   Electronically Signed   By: Marcello Moores  Register   On: 04/03/2014 07:50   Dg Chest Port 1 View  04/01/2014   CLINICAL DATA:  Altered mental status; history of coronary artery disease.  EXAM: PORTABLE CHEST - 1 VIEW  COMPARISON:  None.  FINDINGS: The cardiac silhouette is enlarged. A calcified vessel or stent is present over the left cardiac apex. The pulmonary vascularity is not engorged. The lungs are hypoinflated. There is no pleural effusion. There is degenerative change of the left glenohumeral joint. New  IMPRESSION: There is no evidence of aspiration. The cardiac silhouette is enlarged without evidence of pulmonary edema.   Electronically Signed   By: David  Martinique   On: 04/01/2014 23:46    Assessment:  1 Acute Kidney Injury, hemodynamically mediated due to hypotension,  A fib and osmotic diuresis from hyperglycemia 2 Stage 4 CKD, prob due to hypertension 3 Microhematuria, of uncertain cause(DM or other)  Plan: 1 Hemodynamic support 2 Control heartrate 3 Optimize C.O. 4 Urologic eval if persistent microhematuria 5 Renal ultrasound  Estanislado Emms 04/03/2014, 4:59 PM

## 2014-04-04 ENCOUNTER — Inpatient Hospital Stay (HOSPITAL_COMMUNITY): Payer: Medicare HMO

## 2014-04-04 ENCOUNTER — Encounter (HOSPITAL_COMMUNITY): Payer: Self-pay | Admitting: Physician Assistant

## 2014-04-04 DIAGNOSIS — R93429 Abnormal radiologic findings on diagnostic imaging of unspecified kidney: Secondary | ICD-10-CM | POA: Diagnosis present

## 2014-04-04 DIAGNOSIS — I4891 Unspecified atrial fibrillation: Secondary | ICD-10-CM

## 2014-04-04 DIAGNOSIS — E785 Hyperlipidemia, unspecified: Secondary | ICD-10-CM | POA: Diagnosis present

## 2014-04-04 DIAGNOSIS — I2510 Atherosclerotic heart disease of native coronary artery without angina pectoris: Secondary | ICD-10-CM | POA: Diagnosis present

## 2014-04-04 DIAGNOSIS — R3129 Other microscopic hematuria: Secondary | ICD-10-CM | POA: Diagnosis present

## 2014-04-04 DIAGNOSIS — I10 Essential (primary) hypertension: Secondary | ICD-10-CM | POA: Diagnosis present

## 2014-04-04 DIAGNOSIS — I5042 Chronic combined systolic (congestive) and diastolic (congestive) heart failure: Secondary | ICD-10-CM | POA: Diagnosis present

## 2014-04-04 DIAGNOSIS — E1165 Type 2 diabetes mellitus with hyperglycemia: Secondary | ICD-10-CM

## 2014-04-04 DIAGNOSIS — R9089 Other abnormal findings on diagnostic imaging of central nervous system: Secondary | ICD-10-CM | POA: Diagnosis present

## 2014-04-04 DIAGNOSIS — I480 Paroxysmal atrial fibrillation: Secondary | ICD-10-CM | POA: Diagnosis present

## 2014-04-04 DIAGNOSIS — I5022 Chronic systolic (congestive) heart failure: Secondary | ICD-10-CM

## 2014-04-04 DIAGNOSIS — IMO0002 Reserved for concepts with insufficient information to code with codable children: Secondary | ICD-10-CM | POA: Diagnosis present

## 2014-04-04 DIAGNOSIS — N184 Chronic kidney disease, stage 4 (severe): Secondary | ICD-10-CM

## 2014-04-04 DIAGNOSIS — N179 Acute kidney failure, unspecified: Secondary | ICD-10-CM

## 2014-04-04 DIAGNOSIS — E1129 Type 2 diabetes mellitus with other diabetic kidney complication: Secondary | ICD-10-CM | POA: Diagnosis present

## 2014-04-04 DIAGNOSIS — N19 Unspecified kidney failure: Secondary | ICD-10-CM

## 2014-04-04 LAB — RENAL FUNCTION PANEL
Albumin: 2.5 g/dL — ABNORMAL LOW (ref 3.5–5.2)
BUN: 48 mg/dL — ABNORMAL HIGH (ref 6–23)
CO2: 26 mEq/L (ref 19–32)
Calcium: 9.1 mg/dL (ref 8.4–10.5)
Chloride: 99 mEq/L (ref 96–112)
Creatinine, Ser: 3.6 mg/dL — ABNORMAL HIGH (ref 0.50–1.35)
GFR calc Af Amer: 20 mL/min — ABNORMAL LOW (ref >90)
GFR calc non Af Amer: 17 mL/min — ABNORMAL LOW (ref >90)
Glucose, Bld: 268 mg/dL — ABNORMAL HIGH (ref 70–99)
Phosphorus: 3 mg/dL (ref 2.3–4.6)
Potassium: 3.8 mEq/L (ref 3.7–5.3)
Sodium: 138 mEq/L (ref 137–147)

## 2014-04-04 LAB — CBC
HCT: 37.4 % — ABNORMAL LOW (ref 39.0–52.0)
Hemoglobin: 12.1 g/dL — ABNORMAL LOW (ref 13.0–17.0)
MCH: 29.7 pg (ref 26.0–34.0)
MCHC: 32.4 g/dL (ref 30.0–36.0)
MCV: 91.7 fL (ref 78.0–100.0)
PLATELETS: 159 10*3/uL (ref 150–400)
RBC: 4.08 MIL/uL — AB (ref 4.22–5.81)
RDW: 12.8 % (ref 11.5–15.5)
WBC: 7.3 10*3/uL (ref 4.0–10.5)

## 2014-04-04 LAB — BASIC METABOLIC PANEL
BUN: 55 mg/dL — ABNORMAL HIGH (ref 6–23)
CO2: 26 meq/L (ref 19–32)
CREATININE: 4.23 mg/dL — AB (ref 0.50–1.35)
Calcium: 8.4 mg/dL (ref 8.4–10.5)
Chloride: 96 mEq/L (ref 96–112)
GFR calc Af Amer: 16 mL/min — ABNORMAL LOW (ref >90)
GFR calc non Af Amer: 14 mL/min — ABNORMAL LOW (ref >90)
Glucose, Bld: 243 mg/dL — ABNORMAL HIGH (ref 70–99)
Potassium: 3.6 mEq/L — ABNORMAL LOW (ref 3.7–5.3)
SODIUM: 134 meq/L — AB (ref 137–147)

## 2014-04-04 LAB — GLUCOSE, CAPILLARY
GLUCOSE-CAPILLARY: 205 mg/dL — AB (ref 70–99)
Glucose-Capillary: 267 mg/dL — ABNORMAL HIGH (ref 70–99)

## 2014-04-04 LAB — HEPARIN LEVEL (UNFRACTIONATED): Heparin Unfractionated: 0.55 IU/mL (ref 0.30–0.70)

## 2014-04-04 MED ORDER — INSULIN GLARGINE 100 UNIT/ML ~~LOC~~ SOLN
45.0000 [IU] | Freq: Every day | SUBCUTANEOUS | Status: DC
  Administered 2014-04-04 – 2014-04-07 (×3): 45 [IU] via SUBCUTANEOUS
  Filled 2014-04-04 (×5): qty 0.45

## 2014-04-04 MED ORDER — METOPROLOL TARTRATE 12.5 MG HALF TABLET
12.5000 mg | ORAL_TABLET | Freq: Two times a day (BID) | ORAL | Status: DC
  Filled 2014-04-04 (×2): qty 1

## 2014-04-04 MED ORDER — INSULIN ASPART 100 UNIT/ML ~~LOC~~ SOLN
10.0000 [IU] | Freq: Three times a day (TID) | SUBCUTANEOUS | Status: DC
  Administered 2014-04-04 – 2014-04-08 (×11): 10 [IU] via SUBCUTANEOUS

## 2014-04-04 MED ORDER — METOPROLOL TARTRATE 25 MG PO TABS
25.0000 mg | ORAL_TABLET | Freq: Two times a day (BID) | ORAL | Status: DC
  Administered 2014-04-04 – 2014-04-08 (×8): 25 mg via ORAL
  Filled 2014-04-04 (×10): qty 1

## 2014-04-04 MED ORDER — POTASSIUM CHLORIDE CRYS ER 20 MEQ PO TBCR
40.0000 meq | EXTENDED_RELEASE_TABLET | Freq: Once | ORAL | Status: AC
  Administered 2014-04-04: 40 meq via ORAL
  Filled 2014-04-04: qty 2

## 2014-04-04 MED ORDER — ACETAMINOPHEN 325 MG PO TABS
650.0000 mg | ORAL_TABLET | Freq: Four times a day (QID) | ORAL | Status: DC | PRN
  Administered 2014-04-04 – 2014-04-05 (×2): 650 mg via ORAL
  Filled 2014-04-04 (×2): qty 2

## 2014-04-04 NOTE — Progress Notes (Addendum)
ANTICOAGULATION CONSULT NOTE - Follow-up  Pharmacy Consult for heparin Indication: atrial fibrillation  No Known Allergies  Patient Measurements: Height: 5\' 6"  (167.6 cm) Weight: 207 lb 0.2 oz (93.9 kg) IBW/kg (Calculated) : 63.8 Heparin Dosing Weight: 76 kg  Vital Signs: Temp: 98.7 F (37.1 C) (06/09 0010) Temp src: Oral (06/09 0010) BP: 135/93 mmHg (06/09 0010) Pulse Rate: 50 (06/09 0010)  Labs:  Recent Labs  04/02/14 0130 04/02/14 0600 04/02/14 1256 04/03/14 0234 04/03/14 1120 04/03/14 1430 04/04/14 0002  HGB  --  13.7  --  13.6  --   --  12.1*  HCT  --  39.3  --  40.3  --   --  37.4*  PLT  --  157  --  166  --   --  159  HEPARINUNFRC  --   --   --   --   --  0.81* 0.55  CREATININE  --  4.05*  --  4.95* 4.85*  --  4.23*  TROPONINI <0.30 <0.30 <0.30  --   --   --   --     Estimated Creatinine Clearance: 19.7 ml/min (by C-G formula based on Cr of 4.23).  Assessment:  Heparin level 0.55 after rate decrease to 950 units /hr . No bleeding noted  Goal of Therapy:  Heparin level 0.3-0.7 units/ml Monitor platelets by anticoagulation protocol: Yes   Plan:  1 continue heparin drip at  950 units/hr 2. F/u daily HL   Tony Freeman

## 2014-04-04 NOTE — Progress Notes (Signed)
Patient Name: Tony Freeman Date of Encounter: 04/04/2014     Principal Problem:   Encephalopathy Active Problems:   Hyperglycemia   Elevated troponin   Renal failure   Chronic kidney disease (CKD), stage IV (severe)   DM (diabetes mellitus), type 2, uncontrolled, with renal complications   Essential hypertension, benign   Chronic systolic heart failure   CAD (coronary artery disease)   Other and unspecified hyperlipidemia   Abnormal renal ultrasound with left renal lesion and bladder wall thickening   Microscopic hematuria   Abnormal brain CT/age indeterminate lacunar infarct   Atrial fibrillation with RVR    SUBJECTIVE  Patient states he felt good. Denies any chest pain or SOB. No acute event overnight. Denies any prior history of a-fib or a-flutter  CURRENT MEDS . aspirin EC  81 mg Oral Daily  . atorvastatin  40 mg Oral Daily  . insulin aspart  0-15 Units Subcutaneous TID WC  . insulin aspart  0-5 Units Subcutaneous QHS  . insulin aspart  10 Units Subcutaneous TID WC  . insulin glargine  45 Units Subcutaneous QHS  . metoprolol tartrate  12.5 mg Oral BID  . sodium chloride  3 mL Intravenous Q12H    OBJECTIVE  Filed Vitals:   04/04/14 0010 04/04/14 0500 04/04/14 0530 04/04/14 0916  BP: 135/93  144/94   Pulse: 50  91 88  Temp: 98.7 F (37.1 C)  97.9 F (36.6 C) 98.1 F (36.7 C)  TempSrc: Oral  Oral Oral  Resp: 21  15   Height:      Weight:  210 lb 5.1 oz (95.4 kg)    SpO2: 98%  100%     Intake/Output Summary (Last 24 hours) at 04/04/14 1033 Last data filed at 04/04/14 0900  Gross per 24 hour  Intake 2511.6 ml  Output   1100 ml  Net 1411.6 ml   Filed Weights   04/02/14 0230 04/03/14 0336 04/04/14 0500  Weight: 198 lb 10.2 oz (90.1 kg) 207 lb 0.2 oz (93.9 kg) 210 lb 5.1 oz (95.4 kg)    PHYSICAL EXAM  General: Pleasant, NAD. Slow to answer question Neuro: Alert and oriented X 3. Moves all extremities spontaneously. Psych: Normal  affect. HEENT:  Normal  Neck: Supple without bruits or JVD. Lungs:  Resp regular and unlabored, CTA. Heart: irregularly irregular no s3, s4, or murmurs. Abdomen: Soft, non-tender, non-distended, BS + x 4.  Extremities: No clubbing, cyanosis or edema. DP/PT/Radials 2+ and equal bilaterally. No significant peripheral edema  Accessory Clinical Findings  CBC  Recent Labs  04/03/14 0234 04/04/14 0002  WBC 8.5 7.3  HGB 13.6 12.1*  HCT 40.3 37.4*  MCV 89.6 91.7  PLT 166 016   Basic Metabolic Panel  Recent Labs  04/02/14 0600 04/03/14 0234 04/03/14 1120 04/04/14 0002  NA 133* 132* 135* 134*  K 3.8 3.4* 3.9 3.6*  CL 88* 88* 96 96  CO2 31 26 25 26   GLUCOSE 334* 464* 261* 243*  BUN 58* 65* 64* 55*  CREATININE 4.05* 4.95* 4.85* 4.23*  CALCIUM 9.6 8.7 8.5 8.4  MG  --  1.5  --   --    Liver Function Tests  Recent Labs  04/01/14 2300  AST 34  ALT 35  ALKPHOS 173*  BILITOT 1.8*  PROT 7.6  ALBUMIN 3.4*  Cardiac Enzymes  Recent Labs  04/02/14 0130 04/02/14 0600 04/02/14 1256  TROPONINI <0.30 <0.30 <0.30   Hemoglobin A1C  Recent Labs  04/03/14 1120  HGBA1C 11.7*  Thyroid Function Tests  Recent Labs  04/03/14 0230  TSH 0.415    TELE  A-fib vs flutter with HR 80-100s, occasional PVC, no significant NSVT  ECG  A-fib with RVR with HR 120s  Radiology/Studies  Ct Head Wo Contrast  04/02/2014   CLINICAL DATA:  Weakness and dizziness.  EXAM: CT HEAD WITHOUT CONTRAST  TECHNIQUE: Contiguous axial images were obtained from the base of the skull through the vertex without intravenous contrast.  COMPARISON:  None.  FINDINGS: The ventricles are in the midline and demonstrate normal configuration. No mass effect or shift. No extra-axial fluid collections are identified. Focal area of low attenuation in the left thymic area is likely an age indeterminate lacunar type infarct. No hemorrhage. No findings for hemispheric infarction. No mass lesions. The brainstem and  cerebellum are grossly normal.  The bony structures are unremarkable. The paranasal sinuses and mastoid air cells are clear.  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  No findings for hemispheric infarction or intracranial hemorrhage.   Electronically Signed   By: Kalman Jewels M.D.   On: 04/02/2014 00:13   Mr Brain Wo Contrast  04/04/2014   CLINICAL DATA:  63 year old male with diabetes, hyperlipidemia, history of strokes and congestive heart failure. Dementia. Altered mental status. Very weak.  EXAM: MRI HEAD WITHOUT CONTRAST  TECHNIQUE: Multiplanar, multiecho pulse sequences of the brain and surrounding structures were obtained without intravenous contrast.  COMPARISON:  04/01/2014 CT.  No comparison MR.  FINDINGS: No acute infarct.  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  Minimal small vessel disease type changes.  No intracranial mass lesion noted on this unenhanced exam.  Mild atrophy without hydrocephalus.  Exophthalmos.  Major intracranial vascular structures are patent with diminutive size right vertebral artery.  Cervical medullary junction, pituitary region and pineal region unremarkable.  Minimal mucosal thickening maxillary sinuses.  IMPRESSION: No acute infarct.  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  Minimal small vessel disease type changes.  Mild atrophy without hydrocephalus.  Exophthalmos.  Major intracranial vascular structures are patent with diminutive size right vertebral artery.   Electronically Signed   By: Chauncey Cruel M.D.   On: 04/04/2014 09:48   US Renal  04/04/2014   CLINICAL DATA:  Hematuria.  Renal failure.  Diabetes.  EXAM: RENAL/URINARY TRACT ULTRASOUND COMPLETE  COMPARISON:  None.  FINDINGS: Right Kidney:  Length: 10.5 cm. Right kidney is echogenic consistent with chronic medical renal disease. No hydronephrosis . 3.4 cm simple cyst right upper renal pole. 2.5 cm simple cyst right mid kidney.   Left Kidney:  Length: 10.7 cm. Left kidney is echogenic consistent with chronic medical renal disease. No hydronephrosis . There is a 2.2 x 1.8 x 1.7 cm complex cyst versus solid lesion left upper renal pole. CT or MRI should be considered for further evaluation. Renal cell carcinoma cannot be excluded.  Bladder:  Bladder is nondistended. Mild bladder wall thickening is present. Active bladder pathology cannot be excluded .  IMPRESSION: 1. 2.2 x 1.8 x 1.7 cm complex cyst versus solid lesion left upper renal pole. CT or MRI should be considered for further evaluation. Renal cell carcinoma cannot be excluded. 2. Mild bladder wall thickening . Cystoscopy should be considered to exclude significant bladder wall pathology. 3. Echogenic kidneys suggesting chronic medical renal disease. No hydronephrosis or bladder distention .   Electronically Signed   By: Marcello Moores  Register   On: 04/04/2014 07:49  Dg Chest Port 1 View  04/03/2014   CLINICAL DATA:  Hypoxia.  EXAM: PORTABLE CHEST - 1 VIEW  COMPARISON:  Chest x-ray 04/01/2014.  FINDINGS: Mediastinum and hilar structures are normal. Stable cardiomegaly with normal pulmonary vascularity. No pleural effusion or pneumothorax. No acute osseous abnormality .  IMPRESSION: 1. Stable cardiomegaly. 2. No acute cardiopulmonary disease.   Electronically Signed   By: Marcello Moores  Register   On: 04/03/2014 07:50   Dg Chest Port 1 View  04/01/2014   CLINICAL DATA:  Altered mental status; history of coronary artery disease.  EXAM: PORTABLE CHEST - 1 VIEW  COMPARISON:  None.  FINDINGS: The cardiac silhouette is enlarged. A calcified vessel or stent is present over the left cardiac apex. The pulmonary vascularity is not engorged. The lungs are hypoinflated. There is no pleural effusion. There is degenerative change of the left glenohumeral joint. New  IMPRESSION: There is no evidence of aspiration. The cardiac silhouette is enlarged without evidence of pulmonary edema.   Electronically Signed    By: David  Martinique   On: 04/01/2014 23:46    ASSESSMENT AND PLAN  New patient to [facility], prior h/o CAD with chronic systolic and diastolic dysfunction and h/o CKD initially came in with AMS. Initially noted to have mildly elevated troponin, however does not appears to be significant as patient has no HF symptom, no anginal sx and echo shows no decrease in EF. CT and MRI confirmed remote infarct however no new infarct. AMS resolved, believe due to multifactorial etiology. Acute on chronic renal insufficiency followed by nephrology. Patient went into presumably new onset a-fib with RVR shortly after midnight in the morning of 6/8. Heparin gtt initiated. Renal u/s revealed a 1.7 cm cyst vs mass, urology consulted. Holding off systemic anticoagulation pending possible urology workup.   1. New onset a-fib/flutter with RVR (per tele start shortly after midnight in the morning of 6/8, no prior knowledge of arrhythmia)  - CHA2DS2-Vasc score 5-6 (CHF, DM, stroke, prior MI +/- HTN)  - continue heparin gtt, holding off systemic anticoagulation given evaluation for abnormal renal u/s cyst vs solid lesion  - used to be on toprol XL 200mg  at home, currently on metoprolol 12.5 mg BID  - once cleared by urology, will start coumadin. NOAC not ideal choice given worsening renal function  - HR still poorly controlled, recommend increase metoprolol to 25mg  BID for better BP control if ok with nephrology (avoid hypotension with AKI while control HR)  - patient has no cardiac awareness, will discuss with Dr. Claiborne Billings to see if leave him in a-fib/flutter vs cardioversion once stabilize (has been on heparin gtt since onset)   2. Acute Encephalopathy - resolved, likely multifactorial per IM  - CT of brain 04/02/14 age indeterminate L laminae lacunar infarct  - MRI of brain 04/04/14 no acute infarct, remote small infarct anterior medial aspect of L thalamus  - per primary cardiologist note on 10/15/2003, has old cerebral infarct with  cognitive deficit   3. CAD s/p multiple stents and follow by Dr. Celine Ahr at [facility] (total of 10 stents per son)  - PCI LAD/RCA in 2007  - PCI LAD/RCA in 2009 at [facility]  - Abnormal lexiscan stress test 06/2013 moderate reversible perfusion abnormality in anterior, septal and apical region  - PCI LAD 08/2013   - Plavix discontinued for unclear reason (pending urology workup?), given PCI in 08/2013. May need to resume once bleed risk low. Can consider plavix + coumadin on discharge  given recent PCI and a-fib  4. History of combined systolic and diastolic dysfunction  - Echo at [facility] 02/09/2013 EF 35-40%, grade II diastolic dysfunction, 8-0+ mitral regurg, RVSP 25mmHg   - Echo at [facility] 11/17/2013 Ef <25%, severe hypokinesis of LV, moderately dilated LA, grade IV/IV diastolic dysfunction with irreversible restrictive pattern of mitral inflow, severe pulm HTN (RVSP >61mmHg), 1-2+ MR  - proBNP at [facility] 03/14/2014 >13000  - Echo at [facility] 04/02/14 EF 25-30%, moderate concentric LVH, diffuse hypokinesis, moderately dilated LA, no significant MR observed.  - no sign of acute HF on exam, nephrology following. Not on lasix at this time, however pt states he was on lasix at home (not documented).   - Continue to monitor fluid status for now  5. Acute on chronic renal insufficiency  - baseline 2.10 on 03/14/14 at [place], present with Cr 3.63 on 04/01/2014  - nephrology following  6. Mildly elevated troponin on admission  - likely related to intracranial etiology and CKD  - no anginal symptom to suggest related to CAD  7. DM  8. Abnormal renal US with L renal complex cyst v solid lesion and bladder wall thickening  - Urology consulted for possible cystoscopy  Signed, Almyra Deforest PA Pager: [phone]   Patient seen and examined. Agree with assessment and plan as well outlined above. Peak Cr 4.95, slightly improved today at 4.23. AF rate now in upper 90's. BP stable Will increase  lopressor to 25 bid and titrate as BP and HR allow. Encephlopathy resolved, Await urologic eval and defer oral anticoagulation until complete. Currently on heparin for anticoagulation,.   Troy Sine, MD, [facility] 04/04/2014 1:47 PM

## 2014-04-04 NOTE — Consult Note (Signed)
I have been asked to see the patient by Dr. Doree Barthel, for evaluation and management of renal mass, thickened bladder wall and hematuria.  History of present illness: This is a 63 year old male who is admitted for encephalopathy and altered mental status. He also has a history of chronic renal insufficiency and poorly controlled diabetes. As part of the patient's workup he received an abdominal ultrasound. The ultrasound showed marked multiple bilateral complex renal cyst. The patient was also noted to have a thickened bladder wall. His urinalysis reveals microscopic hematuria.  The patient tells me that he has a history of overactive bladder, he feels as if he empties too much. Is not feel as if he empties his bladder completely. States that he has a weak stream. The patient is a history of recurrent UTIs.  The patient was noted to have bilateral simple renal cyst on an MRI obtained 2013.  Review of systems: A 12 point comprehensive review of systems was obtained and is negative unless otherwise stated in the history of present illness.  Patient Active Problem List   Diagnosis Date Noted  . Chronic kidney disease (CKD), stage IV (severe) 04/04/2014  . DM (diabetes mellitus), type 2, uncontrolled, with renal complications 67/[id number]/[id number]  . Essential hypertension, benign 04/04/2014  . Chronic combined systolic and diastolic CHF (congestive heart failure) 04/04/2014  . CAD (coronary artery disease) 04/04/2014  . Other and unspecified hyperlipidemia 04/04/2014  . Abnormal renal ultrasound with left renal lesion and bladder wall thickening 04/04/2014  . Microscopic hematuria 04/04/2014  . Abnormal brain CT/age indeterminate lacunar infarct 04/04/2014  . Atrial fibrillation with RVR 04/04/2014  . Hyperglycemia 04/02/2014  . Sepsis 04/02/2014  . Encephalopathy 04/02/2014  . Elevated troponin 04/02/2014  . Renal failure 04/02/2014    No current facility-administered medications on file  prior to encounter.   No current outpatient prescriptions on file prior to encounter.    Past Medical History  Diagnosis Date  . Diabetes mellitus without complication   . Systolic and diastolic CHF, chronic     a. Echo at [facility] 11/17/2013 Ef <25%, severe hypokinesis of LV, moderately dilated LA, grade IV/IV diastolic dysfunction with irreversible restrictive pattern of mitral inflow, severe pulm HTN (RVSP >48mmHg), 1-2+ MR  b. Echo at [facility] 04/02/14 EF 25-30%, moderate concentric LVH, diffuse hypokinesis, moderately dilated LA, no significant MR observed  . Kidney failure   . CAD (coronary artery disease)     a. PCI LAD/RCA in 2007  b. PCI LAD/RCA in 2009 at [facility]  c. PCI LAD 08/2013   . Hyperlipidemia   . CVA (cerebral infarction)   . Stroke   . Dementia     Past Surgical History  Procedure Laterality Date  . Coronary stent placement      History  Substance Use Topics  . Smoking status: Never Smoker   . Smokeless tobacco: Not on file  . Alcohol Use: Not on file    Family History  Problem Relation Age of Onset  . Heart disease Mother     PE: Filed Vitals:   04/04/14 0530 04/04/14 0916 04/04/14 1137 04/04/14 1436  BP: 144/94 161/89 144/79 135/84  Pulse: 91 88 105 87  Temp: 97.9 F (36.6 C) 98.1 F (36.7 C) 98 F (36.7 C) 97.9 F (36.6 C)  TempSrc: Oral Oral Oral Oral  Resp: 15  15 17   Height:      Weight:      SpO2: 100%  97% 96%   Patient appears to  be in no acute distress  patient is alert and oriented x3 Atraumatic normocephalic head No cervical or supraclavicular lymphadenopathy appreciated No increased work of breathing, no audible wheezes/rhonchi Irregularly irregular Abdomen is soft, nontender, nondistended, no CVA or suprapubic tenderness Lower extremities are symmetric without appreciable edema Grossly neurologically intact No identifiable skin lesions   Recent Labs  04/02/14 0600 04/03/14 0234 04/04/14 0002  WBC 9.1 8.5 7.3  HGB  13.7 13.6 12.1*  HCT 39.3 40.3 37.4*    Recent Labs  04/03/14 1120 04/04/14 0002 04/04/14 1130  NA 135* 134* 138  K 3.9 3.6* 3.8  CL 96 96 99  CO2 25 26 26   GLUCOSE 261* 243* 268*  BUN 64* 55* 48*  CREATININE 4.85* 4.23* 3.60*  CALCIUM 8.5 8.4 9.1   No results found for this basename: LABPT, INR,  in the last 72 hours No results found for this basename: LABURIN,  in the last 72 hours Results for orders placed during the hospital encounter of 04/01/14  CULTURE, BLOOD (ROUTINE X 2)     Status: None   Collection Time    04/02/14 12:17 AM      Result Value Ref Range Status   Specimen Description BLOOD RIGHT ARM   Final   Special Requests     Final   Value: BOTTLES DRAWN AEROBIC AND ANAEROBIC 10CC Immunocompromised   Culture  Setup Time     Final   Value: 04/02/2014 13:44     Performed at [facility]   Culture     Final   Value:        BLOOD CULTURE RECEIVED NO GROWTH TO DATE CULTURE WILL BE HELD FOR 5 DAYS BEFORE ISSUING A FINAL NEGATIVE REPORT     Performed at [facility]   Report Status PENDING   Incomplete  CULTURE, BLOOD (ROUTINE X 2)     Status: None   Collection Time    04/02/14 12:31 AM      Result Value Ref Range Status   Specimen Description BLOOD LEFT SHOULDER   Final   Special Requests BOTTLES DRAWN AEROBIC ONLY 5CC Immunocompromised   Final   Culture  Setup Time     Final   Value: 04/02/2014 13:44     Performed at [facility]   Culture     Final   Value:        BLOOD CULTURE RECEIVED NO GROWTH TO DATE CULTURE WILL BE HELD FOR 5 DAYS BEFORE ISSUING A FINAL NEGATIVE REPORT     Performed at [facility]   Report Status PENDING   Incomplete  MRSA PCR SCREENING     Status: None   Collection Time    04/02/14  3:27 AM      Result Value Ref Range Status   MRSA by PCR NEGATIVE  NEGATIVE Final   Comment:            The GeneXpert MRSA Assay (FDA     approved for NASAL specimens     only), is one component of a     comprehensive  MRSA colonization     surveillance program. It is not     intended to diagnose MRSA     infection nor to guide or     monitor treatment for     MRSA infections.    Imaging: Renal ultrasound: 1. 2.2 x 1.8 x 1.7 cm complex cyst versus solid lesion left upper  renal pole. CT or MRI should  be considered for further evaluation.  Renal cell carcinoma cannot be excluded.  2. Mild bladder wall thickening . Cystoscopy should be considered to  exclude significant bladder wall pathology.  3. Echogenic kidneys suggesting chronic medical renal disease. No  hydronephrosis or bladder distention  Imp: Patient with a host comorbidities found to have complex renal cyst and thickened bladder wall on ultrasound. He also has associated hematuria.  Recommendations: The cysts seen on the ultrasound were previously identified on the MRI and noted to be simple cyst. However, over time they may have changed and repeating an ultrasound in 3 months to monitor for interval growth/change is appropriate given that we cannot give the patient any contrast.  The patient was also found to have a thickened bladder wall, this is likely a result of long-standing obstruction from his voiding symptoms. Less likely does represent a malignancy involving the entire bladder wall. Further, his hematuria is hard to interpret in the setting of instrumentation and hospitalization.  All these issues can be managed on an outpatient basis. I spoken to the patient about this, he lives in [place] but is moving 2 [place] in the next few weeks. As such, I will schedule him for followup with me with a renal ultrasound and a repeat urinalysis. We will consider cystoscopy if the patient continues to have persistent microscopic hematuria.  Thank you for involving me in this patient's care, and Please page with any further questions or concerns. Ardis Hughs

## 2014-04-04 NOTE — Progress Notes (Addendum)
Inpatient Diabetes Program Recommendations  AACE/ADA: New Consensus Statement on Inpatient Glycemic Control (2013)  Target Ranges:  Prepandial:   less than 140 mg/dL      Peak postprandial:   less than 180 mg/dL (1-2 hours)      Critically ill patients:  140 - 180 mg/dL     Results for SIMUEL, STEBNER (MRN [id number]) as of 04/04/2014 12:05  Ref. Range 04/03/2014 08:11 04/03/2014 11:24 04/03/2014 17:28 04/03/2014 21:39  Glucose-Capillary Latest Range: 70-99 mg/dL 243 (H) 263 (H) 206 (H) 312 (H)    Results for DAQUARIUS, DUBEAU (MRN [id number]) as of 04/04/2014 12:05  Ref. Range 04/04/2014 09:32 04/04/2014 11:39  Glucose-Capillary Latest Range: 70-99 mg/dL 205 (H) 267 (H)    Results for THANG, FLETT (MRN [id number]) as of 04/04/2014 12:05  Ref. Range 04/03/2014 11:20  Hemoglobin A1C Latest Range: <5.7 % 11.7 (H)    Home DM Meds:   Lantus 40 units QHS Humalog 25 units bid (breakfast and lunch)   **A1c shows poor control prior to hospitalization.    **Note that Lantus increased to 45 units QHS.  Will get increased dose tonight.  Patient also receiving Novolog Moderate SSi + Novolog 5 units tidwc (meal coverage)   MD- Please consider increasing Novolog meal coverage to 7 units tidwc if patient continues to have elevated postprandial glucose levels    Will follow Wyn Quaker RN, MSN, CDE Diabetes Coordinator Inpatient Diabetes Program Team Pager: [phone] ([phone]-10p)

## 2014-04-04 NOTE — Progress Notes (Addendum)
Chart reviewed.   PROGRESS NOTE  Tony Freeman MEQ:[id number] DOB: 03/16/1951 DOA: 04/01/2014 PCP: No primary provider on file.  Assessment/Plan: Encephalopathy; resolved, likely multifactorial, metabolic secondary to hyperglycemia, electrolytes abnormalities. 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 MRI negative for acute infarct.    Lactic acidosis- epeat shows resolution. Infectious workup negative.  Off antibiotics  Elevated troponin; no evidence for ACS. Likely related to CKD/ARF/acute illness -cardiology signed off -cycle troponin.  Echo from [place]: 2015: from [facility]  The left ventricle is normal in size. There is mild concentric left ventricular hypertrophy.The left ventricular ejection fraction is severely reduced (<25%). There is severe diffuse hypokinesis of the left ventricle. There is severe (Grade IV/IV) diastolic dysfunction; irreversible restrictive pattern of mitral inflow.  There is also a cardiac cath from 2014   Atrial fib - appears new Remains in atrial fibrillation with controlled ventricular response. Patient recalls having been on warfarin in the past but cannot recall why. CHADS score 5 reconsulted cardiology On heparin gtt. Would hold off on oral anticoagulation until urology recommends next step  Acute Renal Failure, prior history of CKD.  Baseline Cr is 2.1 from 5/19  Nephrology following. Likely from hypotension, uncontrolled DM, atrial fib  Abnormal renal US with left renal complex cyst v. Solid lesion, also bladder wall thickening.   Discussed with radiologist and viewed MRI of the abdomen in care everywhere from 2013. On previous study, multiple renal cysts noted. Given patient's renal failure and inability to have contrast for either CAT scan or MRI, either study may be low yield. Radiologist, recommends renal ultrasound in 3 months, but if absolutely needed, MRI without contrast would be better than CT  without contrast. No hydronephrosis noted.  We will consult urology to consider cystoscopy in this patient with bladder wall thickening and microscopic hematuria.  Will hold Plavix in case procedure needed.  Diabetes, uncontrolled diabetes with renal manifestations Still with high CBGs. Increase insulin. hgb A1C 11.7  hypomagnesium -repleted  Leukocytosis -resolved    Essential hypertension, benign: resume metoprolol at lower dose.     Chronic systolic heart failure    CAD (coronary artery disease)    Other and unspecified hyperlipidemia    Microscopic hematuria    Abnormal brain CT/age indeterminate lacunar infarct  Code Status: full Family Communication: patient Disposition Plan:    Consultants:  nephro  Cards  urology  Procedures:      HPI/Subjective: Denies dyspnea, palpitations, chest pain, weakness. Reports blood sugars usually run "150-250". Has had a stroke in the past, no residual deficits. Was on Coumadin in the past but cannot recall the indication. Cannot recall having had the diagnosis of atrial fibrillation. Denies history of gross hematuria or having seen a urologist in the past. Has been getting up to the commode without difficulty.  Objective: Filed Vitals:   04/04/14 0530  BP: 144/94  Pulse: 91  Temp: 97.9 F (36.6 C)  Resp: 15    Intake/Output Summary (Last 24 hours) at 04/04/14 0904 Last data filed at 04/04/14 0800  Gross per 24 hour  Intake 2436.6 ml  Output   1100 ml  Net 1336.6 ml   Filed Weights   04/02/14 0230 04/03/14 0336 04/04/14 0500  Weight: 90.1 kg (198 lb 10.2 oz) 93.9 kg (207 lb 0.2 oz) 95.4 kg (210 lb 5.1 oz)    telemetry: Atrial fibrillation, rate about 100 Exam:   General:  A+Ox3, NAD  Cardiovascular: Irregularly irregular without murmurs gallops rubs  Respiratory: clear, no wheezing, rales or rhonchi  Abdomen: +BS, soft  Extremities: no edema   Neurologic: Nonfocal.  Data Reviewed: Basic Metabolic  Panel:  Recent Labs Lab 04/01/14 2300 04/02/14 0600 04/03/14 0234 04/03/14 1120 04/04/14 0002  NA 131* 133* 132* 135* 134*  K 3.6* 3.8 3.4* 3.9 3.6*  CL 86* 88* 88* 96 96  CO2 27 31 26 25 26   GLUCOSE 529* 334* 464* 261* 243*  BUN 54* 58* 65* 64* 55*  CREATININE 3.63* 4.05* 4.95* 4.85* 4.23*  CALCIUM 9.8 9.6 8.7 8.5 8.4  MG  --   --  1.5  --   --    Liver Function Tests:  Recent Labs Lab 04/01/14 2300  AST 34  ALT 35  ALKPHOS 173*  BILITOT 1.8*  PROT 7.6  ALBUMIN 3.4*   No results found for this basename: LIPASE, AMYLASE,  in the last 168 hours  Recent Labs Lab 04/02/14 0143  AMMONIA 28   CBC:  Recent Labs Lab 04/01/14 2300 04/02/14 0600 04/03/14 0234 04/04/14 0002  WBC 11.3* 9.1 8.5 7.3  HGB 14.9 13.7 13.6 12.1*  HCT 43.2 39.3 40.3 37.4*  MCV 88.7 88.5 89.6 91.7  PLT 188 157 166 159   Cardiac Enzymes:  Recent Labs Lab 04/02/14 0130 04/02/14 0600 04/02/14 1256  TROPONINI <0.30 <0.30 <0.30   BNP (last 3 results) No results found for this basename: PROBNP,  in the last 8760 hours CBG:  Recent Labs Lab 04/03/14 0133 04/03/14 0811 04/03/14 1124 04/03/14 1728 04/03/14 2139  GLUCAP 439* 243* 263* 206* 312*    Recent Results (from the past 240 hour(s))  CULTURE, BLOOD (ROUTINE X 2)     Status: None   Collection Time    04/02/14 12:17 AM      Result Value Ref Range Status   Specimen Description BLOOD RIGHT ARM   Final   Special Requests     Final   Value: BOTTLES DRAWN AEROBIC AND ANAEROBIC 10CC Immunocompromised   Culture  Setup Time     Final   Value: 04/02/2014 13:44     Performed at [facility]   Culture     Final   Value:        BLOOD CULTURE RECEIVED NO GROWTH TO DATE CULTURE WILL BE HELD FOR 5 DAYS BEFORE ISSUING A FINAL NEGATIVE REPORT     Performed at [facility]   Report Status PENDING   Incomplete  CULTURE, BLOOD (ROUTINE X 2)     Status: None   Collection Time    04/02/14 12:31 AM      Result Value Ref  Range Status   Specimen Description BLOOD LEFT SHOULDER   Final   Special Requests BOTTLES DRAWN AEROBIC ONLY 5CC Immunocompromised   Final   Culture  Setup Time     Final   Value: 04/02/2014 13:44     Performed at [facility]   Culture     Final   Value:        BLOOD CULTURE RECEIVED NO GROWTH TO DATE CULTURE WILL BE HELD FOR 5 DAYS BEFORE ISSUING A FINAL NEGATIVE REPORT     Performed at [facility]   Report Status PENDING   Incomplete  MRSA PCR SCREENING     Status: None   Collection Time    04/02/14  3:27 AM      Result Value Ref Range Status   MRSA by PCR NEGATIVE  NEGATIVE Final   Comment:  The GeneXpert MRSA Assay (FDA     approved for NASAL specimens     only), is one component of a     comprehensive MRSA colonization     surveillance program. It is not     intended to diagnose MRSA     infection nor to guide or     monitor treatment for     MRSA infections.     Studies: US Renal  04/04/2014   CLINICAL DATA:  Hematuria.  Renal failure.  Diabetes.  EXAM: RENAL/URINARY TRACT ULTRASOUND COMPLETE  COMPARISON:  None.  FINDINGS: Right Kidney:  Length: 10.5 cm. Right kidney is echogenic consistent with chronic medical renal disease. No hydronephrosis . 3.4 cm simple cyst right upper renal pole. 2.5 cm simple cyst right mid kidney.  Left Kidney:  Length: 10.7 cm. Left kidney is echogenic consistent with chronic medical renal disease. No hydronephrosis . There is a 2.2 x 1.8 x 1.7 cm complex cyst versus solid lesion left upper renal pole. CT or MRI should be considered for further evaluation. Renal cell carcinoma cannot be excluded.  Bladder:  Bladder is nondistended. Mild bladder wall thickening is present. Active bladder pathology cannot be excluded .  IMPRESSION: 1. 2.2 x 1.8 x 1.7 cm complex cyst versus solid lesion left upper renal pole. CT or MRI should be considered for further evaluation. Renal cell carcinoma cannot be excluded. 2. Mild bladder wall  thickening . Cystoscopy should be considered to exclude significant bladder wall pathology. 3. Echogenic kidneys suggesting chronic medical renal disease. No hydronephrosis or bladder distention .   Electronically Signed   By: Atalissa   On: 04/04/2014 07:49   Dg Chest Port 1 View  04/03/2014   CLINICAL DATA:  Hypoxia.  EXAM: PORTABLE CHEST - 1 VIEW  COMPARISON:  Chest x-ray 04/01/2014.  FINDINGS: Mediastinum and hilar structures are normal. Stable cardiomegaly with normal pulmonary vascularity. No pleural effusion or pneumothorax. No acute osseous abnormality .  IMPRESSION: 1. Stable cardiomegaly. 2. No acute cardiopulmonary disease.   Electronically Signed   By: Marcello Moores  Register   On: 04/03/2014 07:50    Scheduled Meds: . aspirin EC  81 mg Oral Daily  . atorvastatin  40 mg Oral Daily  . clopidogrel  75 mg Oral Q breakfast  . insulin aspart  0-15 Units Subcutaneous TID WC  . insulin aspart  0-5 Units Subcutaneous QHS  . insulin aspart  5 Units Subcutaneous TID WC  . insulin glargine  40 Units Subcutaneous QHS  . pneumococcal 23 valent vaccine  0.5 mL Intramuscular Tomorrow-1000  . potassium chloride  40 mEq Oral Once  . sodium chloride  3 mL Intravenous Q12H   Continuous Infusions: . sodium chloride 75 mL/hr at 04/04/14 0535  . heparin 950 Units/hr (04/04/14 0800)   Antibiotics Given (last 72 hours)   Date/Time Action Medication Dose Rate   04/02/14 0904 Given   piperacillin-tazobactam (ZOSYN) IVPB 2.25 g 2.25 g 100 mL/hr   04/02/14 1422 Given   piperacillin-tazobactam (ZOSYN) IVPB 2.25 g 2.25 g 100 mL/hr   04/02/14 2118 Given   piperacillin-tazobactam (ZOSYN) IVPB 2.25 g 2.25 g 100 mL/hr     Time spent: 45 min  Delfina Redwood, MD [facility] Pager [phone]. If 7PM-7AM, please contact night-coverage at [email or web address], password [facility] 04/04/2014, 9:04 AM  LOS: 3 days

## 2014-04-04 NOTE — Progress Notes (Signed)
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  4 Urologic eval if persistent microhematuria  5 Renal ultrasound  Subjective: Interval History: [place] noted  Objective: Vital signs in last 24 hours: Temp:  [97.8 F (36.6 C)-98.7 F (37.1 C)] 98.1 F (36.7 C) (06/09 0916) Pulse Rate:  [50-91] 88 (06/09 0916) Resp:  [15-21] 15 (06/09 0530) BP: (114-144)/(81-94) 144/94 mmHg (06/09 0530) SpO2:  [98 %-100 %] 100 % (06/09 0530) Weight:  [95.4 kg (210 lb 5.1 oz)] 95.4 kg (210 lb 5.1 oz) (06/09 0500) Weight change: 1.5 kg (3 lb 4.9 oz)  Intake/Output from previous day: 06/08 0701 - 06/09 0700 In: 2666.1 [P.O.:480; I.V.:2186.1] Out: 1100 [Urine:1100] Intake/Output this shift: Total I/O In: 159.5 [I.V.:159.5] Out: -   General appearance: alert and cooperative Resp: clear to auscultation bilaterally Cardio: regular rate and rhythm, S1, S2 normal, no murmur, click, rub or gallop Extremities: edema 1+  Lab Results:  Recent Labs  04/03/14 0234 04/04/14 0002  WBC 8.5 7.3  HGB 13.6 12.1*  HCT 40.3 37.4*  PLT 166 159   BMET:  Recent Labs  04/03/14 1120 04/04/14 0002  NA 135* 134*  K 3.9 3.6*  CL 96 96  CO2 25 26  GLUCOSE 261* 243*  BUN 64* 55*  CREATININE 4.85* 4.23*  CALCIUM 8.5 8.4   No results found for this basename: PTH,  in the last 72 hours Iron Studies: No results found for this basename: IRON, TIBC, TRANSFERRIN, FERRITIN,  in the last 72 hours Studies/Results: Mr Brain Wo Contrast  04/04/2014   CLINICAL DATA:  63 year old male with diabetes, hyperlipidemia, history of strokes and congestive heart failure. Dementia. Altered mental status. Very weak.  EXAM: MRI HEAD WITHOUT CONTRAST  TECHNIQUE:  Multiplanar, multiecho pulse sequences of the brain and surrounding structures were obtained without intravenous contrast.  COMPARISON:  04/01/2014 CT.  No comparison MR.  FINDINGS: No acute infarct.  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  Minimal small vessel disease type changes.  No intracranial mass lesion noted on this unenhanced exam.  Mild atrophy without hydrocephalus.  Exophthalmos.  Major intracranial vascular structures are patent with diminutive size right vertebral artery.  Cervical medullary junction, pituitary region and pineal region unremarkable.  Minimal mucosal thickening maxillary sinuses.  IMPRESSION: No acute infarct.  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  Minimal small vessel disease type changes.  Mild atrophy without hydrocephalus.  Exophthalmos.  Major intracranial vascular structures are patent with diminutive size right vertebral artery.   Electronically Signed   By: Chauncey Cruel M.D.   On: 04/04/2014 09:48   US Renal  04/04/2014   CLINICAL DATA:  Hematuria.  Renal failure.  Diabetes.  EXAM: RENAL/URINARY TRACT ULTRASOUND COMPLETE  COMPARISON:  None.  FINDINGS: Right Kidney:  Length: 10.5 cm. Right kidney is echogenic consistent with chronic medical renal disease. No hydronephrosis . 3.4 cm simple cyst right upper renal pole. 2.5 cm simple cyst right mid kidney.  Left Kidney:  Length: 10.7 cm. Left kidney is echogenic consistent with chronic medical renal disease. No hydronephrosis . There is a 2.2 x 1.8 x 1.7 cm complex cyst versus solid lesion left upper renal pole. CT or MRI should be considered for  further evaluation. Renal cell carcinoma cannot be excluded.  Bladder:  Bladder is nondistended. Mild bladder wall thickening is present. Active bladder pathology cannot be excluded .  IMPRESSION: 1. 2.2 x 1.8 x 1.7 cm complex cyst versus solid lesion left upper renal pole. CT or MRI should be considered for further evaluation.  Renal cell carcinoma cannot be excluded. 2. Mild bladder wall thickening . Cystoscopy should be considered to exclude significant bladder wall pathology. 3. Echogenic kidneys suggesting chronic medical renal disease. No hydronephrosis or bladder distention .   Electronically Signed   By: Falkner   On: 04/04/2014 07:49   Dg Chest Port 1 View  04/03/2014   CLINICAL DATA:  Hypoxia.  EXAM: PORTABLE CHEST - 1 VIEW  COMPARISON:  Chest x-ray 04/01/2014.  FINDINGS: Mediastinum and hilar structures are normal. Stable cardiomegaly with normal pulmonary vascularity. No pleural effusion or pneumothorax. No acute osseous abnormality .  IMPRESSION: 1. Stable cardiomegaly. 2. No acute cardiopulmonary disease.   Electronically Signed   By: Marcello Moores  Register   On: 04/03/2014 07:50   Scheduled: . aspirin EC  81 mg Oral Daily  . atorvastatin  40 mg Oral Daily  . insulin aspart  0-15 Units Subcutaneous TID WC  . insulin aspart  0-5 Units Subcutaneous QHS  . insulin aspart  10 Units Subcutaneous TID WC  . insulin glargine  45 Units Subcutaneous QHS  . metoprolol tartrate  12.5 mg Oral BID  . sodium chloride  3 mL Intravenous Q12H    LOS: 3 days   Estanislado Emms 04/04/2014,11:29 AM

## 2014-04-05 DIAGNOSIS — I509 Heart failure, unspecified: Secondary | ICD-10-CM

## 2014-04-05 DIAGNOSIS — I5042 Chronic combined systolic (congestive) and diastolic (congestive) heart failure: Secondary | ICD-10-CM

## 2014-04-05 DIAGNOSIS — I10 Essential (primary) hypertension: Secondary | ICD-10-CM

## 2014-04-05 LAB — BASIC METABOLIC PANEL
BUN: 36 mg/dL — ABNORMAL HIGH (ref 6–23)
CALCIUM: 9.2 mg/dL (ref 8.4–10.5)
CO2: 23 mEq/L (ref 19–32)
CREATININE: 3 mg/dL — AB (ref 0.50–1.35)
Chloride: 103 mEq/L (ref 96–112)
GFR calc Af Amer: 24 mL/min — ABNORMAL LOW (ref >90)
GFR, EST NON AFRICAN AMERICAN: 21 mL/min — AB (ref >90)
Glucose, Bld: 97 mg/dL (ref 70–99)
Potassium: 3.8 mEq/L (ref 3.7–5.3)
Sodium: 139 mEq/L (ref 137–147)

## 2014-04-05 LAB — GLUCOSE, CAPILLARY
GLUCOSE-CAPILLARY: 216 mg/dL — AB (ref 70–99)
GLUCOSE-CAPILLARY: 155 mg/dL — AB (ref 70–99)
GLUCOSE-CAPILLARY: 104 mg/dL — AB (ref 70–99)
GLUCOSE-CAPILLARY: 105 mg/dL — AB (ref 70–99)
Glucose-Capillary: 128 mg/dL — ABNORMAL HIGH (ref 70–99)
Glucose-Capillary: 116 mg/dL — ABNORMAL HIGH (ref 70–99)

## 2014-04-05 LAB — CBC
HCT: 35.9 % — ABNORMAL LOW (ref 39.0–52.0)
Hemoglobin: 11.9 g/dL — ABNORMAL LOW (ref 13.0–17.0)
MCH: 30.7 pg (ref 26.0–34.0)
MCHC: 33.1 g/dL (ref 30.0–36.0)
MCV: 92.8 fL (ref 78.0–100.0)
Platelets: 160 10*3/uL (ref 150–400)
RBC: 3.87 MIL/uL — AB (ref 4.22–5.81)
RDW: 12.9 % (ref 11.5–15.5)
WBC: 7.5 10*3/uL (ref 4.0–10.5)

## 2014-04-05 LAB — PROTIME-INR
INR: 0.89 (ref 0.00–1.49)
Prothrombin Time: 11.9 seconds (ref 11.6–15.2)

## 2014-04-05 LAB — HEPARIN LEVEL (UNFRACTIONATED)
Heparin Unfractionated: 0.25 IU/mL — ABNORMAL LOW (ref 0.30–0.70)
Heparin Unfractionated: 0.28 IU/mL — ABNORMAL LOW (ref 0.30–0.70)

## 2014-04-05 MED ORDER — WARFARIN SODIUM 7.5 MG PO TABS
7.5000 mg | ORAL_TABLET | Freq: Once | ORAL | Status: AC
  Administered 2014-04-05: 7.5 mg via ORAL
  Filled 2014-04-05: qty 1

## 2014-04-05 MED ORDER — COUMADIN BOOK
Freq: Once | Status: AC
  Administered 2014-04-05: 18:00:00
  Filled 2014-04-05: qty 1

## 2014-04-05 MED ORDER — WARFARIN VIDEO
Freq: Once | Status: AC
  Administered 2014-04-05: 18:00:00

## 2014-04-05 MED ORDER — WARFARIN - PHARMACIST DOSING INPATIENT
Freq: Every day | Status: DC
  Administered 2014-04-05: 18:00:00

## 2014-04-05 MED ORDER — CLOPIDOGREL BISULFATE 75 MG PO TABS
75.0000 mg | ORAL_TABLET | Freq: Every day | ORAL | Status: DC
  Administered 2014-04-05 – 2014-04-08 (×4): 75 mg via ORAL
  Filled 2014-04-05 (×6): qty 1

## 2014-04-05 NOTE — Progress Notes (Signed)
ANTICOAGULATION CONSULT NOTE - Follow Up Consult  Pharmacy Consult for Heparin  Indication: atrial fibrillation  No Known Allergies  Patient Measurements: Height: 5\' 6"  (167.6 cm) Weight: 210 lb 5.1 oz (95.4 kg) IBW/kg (Calculated) : 63.8  Vital Signs: Temp: 98.5 F (36.9 C) (06/09 2325) Temp src: Oral (06/09 2325) BP: 133/89 mmHg (06/09 2325) Pulse Rate: 73 (06/09 2325)  Labs:  Recent Labs  04/02/14 0600 04/02/14 1256 04/03/14 0234 04/03/14 1120 04/03/14 1430 04/04/14 0002 04/04/14 1130 04/05/14 0257  HGB 13.7  --  13.6  --   --  12.1*  --  11.9*  HCT 39.3  --  40.3  --   --  37.4*  --  35.9*  PLT 157  --  166  --   --  159  --  160  HEPARINUNFRC  --   --   --   --  0.81* 0.55  --  0.25*  CREATININE 4.05*  --  4.95* 4.85*  --  4.23* 3.60*  --   TROPONINI <0.30 <0.30  --   --   --   --   --   --     Estimated Creatinine Clearance: 23.3 ml/min (by C-G formula based on Cr of 3.6).   Medications:  Heparin 950 units/hr  Assessment: 63 y/o M on heparin for afib. HL 0.25, Hgb 11.9, other labs as above.   Goal of Therapy:  Heparin level 0.3-0.7 units/ml Monitor platelets by anticoagulation protocol: Yes   Plan:  -Increase heparin drip to 1050 units/hr -1230 HL  -Daily CBC/HL -Monitor for bleeding  Narda Bonds 04/05/2014,4:06 AM

## 2014-04-05 NOTE — Progress Notes (Addendum)
Inpatient Diabetes Program Recommendations  AACE/ADA: New Consensus Statement on Inpatient Glycemic Control (2013)  Target Ranges:  Prepandial:   less than 140 mg/dL      Peak postprandial:   less than 180 mg/dL (1-2 hours)      Critically ill patients:  140 - 180 mg/dL     Results for Tony Freeman, Tony Freeman (MRN [id number]) as of 04/05/2014 10:47  Ref. Range 04/04/2014 09:32 04/04/2014 11:39 04/04/2014 16:36 04/04/2014 21:05  Glucose-Capillary Latest Range: 70-99 mg/dL 205 (H) 267 (H) 216 (H) 155 (H)    Results for Tony Freeman, Tony Freeman (MRN [id number]) as of 04/05/2014 10:47  Ref. Range 04/05/2014 08:12  Glucose-Capillary Latest Range: 70-99 mg/dL 104 (H)     **Note Lantus increased to 45 units QHS last PM.  **Also note Novolog meal coverage increased to 10 units tid with meals yesterday at noon.   Spoke with patient about his elevated A1c of 11.7%.  Explained what an A1c is and what it measures.  Reminded patient that his goal A1c is 7% or less per ADA standards to prevent both acute and long-term complications.  Encouraged patient to check his CBGs at least bid at home (fasting and another check within the day) and to record all CBGs in a logbook for his PCP to review.  Patient stated he does not currently keep a record of his CBGs but can do this if needed.  Patient stated he sees a PCP in [place]. Jay with [facility].  Encouraged patient to follow up with his PCP soon after d/c.  Instructed patient that the physician managing his care here in the hospital may make changes to his insulin regimen before d/c.    Per [facility] records from February of this year, patient was supposed to be taking Lantus 38 units QHS + Novolog 2-10 units tid with meals per [facility]SI   Will follow while inpatient. Wyn Quaker RN, MSN, CDE Diabetes Coordinator Inpatient Diabetes Program Team Pager: [phone] (8a-10p)

## 2014-04-05 NOTE — Progress Notes (Addendum)
ANTICOAGULATION CONSULT NOTE - Follow Up Consult  Pharmacy Consult for Heparin + warfarin Indication: atrial fibrillation  No Known Allergies  Patient Measurements: Height: 5\' 6"  (167.6 cm) Weight: 212 lb 15.4 oz (96.6 kg) IBW/kg (Calculated) : 63.8  Vital Signs: Temp: 97.9 F (36.6 C) (06/10 1457) Temp src: Oral (06/10 1457) BP: 143/87 mmHg (06/10 1457) Pulse Rate: 72 (06/10 1457)  Labs:  Recent Labs  04/03/14 0234  04/04/14 0002 04/04/14 1130 04/05/14 0257 04/05/14 0850 04/05/14 1346  HGB 13.6  --  12.1*  --  11.9*  --   --   HCT 40.3  --  37.4*  --  35.9*  --   --   PLT 166  --  159  --  160  --   --   HEPARINUNFRC  --   < > 0.55  --  0.25*  --  0.28*  CREATININE 4.95*  < > 4.23* 3.60*  --  3.00*  --   < > = values in this interval not displayed.  Estimated Creatinine Clearance: 28.1 ml/min (by C-G formula based on Cr of 3).  Medications:  Heparin 1050 units/hr  Assessment: 63 y/o M continues on heparin for afib. Heparin level remains slightly subtherapeutic at 0.28. No bleeding noted. Also starting on coumadin. Baseline INR is 0.89.  Goal of Therapy:  Heparin level 0.3-0.7 units/ml Monitor platelets by anticoagulation protocol: Yes   Plan:  1. Increase heparin gtt to 1150 units/hr 2. Check an 8 hour heparin level 3. Daily heparin level, CBC and INR 4. Coumadin 7.5mg  PO x 1 tonight 5. Coumadin book + video to patient  Salome Arnt, PharmD, BCPS Pager # [phone] 04/05/2014 3:21 PM  a

## 2014-04-05 NOTE — Progress Notes (Signed)
Report called pt transferring to [id number] via w/c with belongings. 

## 2014-04-05 NOTE — Progress Notes (Signed)
Patient Name: Tony Freeman Seelman Date of Encounter: 04/05/2014     Principal Problem:   Encephalopathy Active Problems:   Hyperglycemia   Elevated troponin   Renal failure   Chronic kidney disease (CKD), stage IV (severe)   DM (diabetes mellitus), type 2, uncontrolled, with renal complications   Essential hypertension, benign   Chronic combined systolic and diastolic CHF (congestive heart failure)   CAD (coronary artery disease)   Other and unspecified hyperlipidemia   Abnormal renal ultrasound with left renal lesion and bladder wall thickening   Microscopic hematuria   Abnormal brain CT/age indeterminate lacunar infarct   Atrial fibrillation with RVR    SUBJECTIVE  Feeling better. No complaints. Denies CP and SOB.   CURRENT MEDS . atorvastatin  40 mg Oral Daily  . clopidogrel  75 mg Oral Q breakfast  . insulin aspart  0-15 Units Subcutaneous TID WC  . insulin aspart  0-5 Units Subcutaneous QHS  . insulin aspart  10 Units Subcutaneous TID WC  . insulin glargine  45 Units Subcutaneous QHS  . metoprolol tartrate  25 mg Oral BID  . sodium chloride  3 mL Intravenous Q12H    OBJECTIVE  Filed Vitals:   04/05/14 0435 04/05/14 0439 04/05/14 0755 04/05/14 1134  BP: 131/91  133/71 123/85  Pulse: 82  89 81  Temp: 97.7 F (36.5 C)  98 F (36.7 C) 98 F (36.7 C)  TempSrc: Oral  Oral Oral  Resp: 17  16 15   Height:      Weight:  212 lb 15.4 oz (96.6 kg)    SpO2: 100%  100% 100%    Intake/Output Summary (Last 24 hours) at 04/05/14 1231 Last data filed at 04/05/14 1134  Gross per 24 hour  Intake   1680 ml  Output   1500 ml  Net    180 ml   Filed Weights   04/03/14 0336 04/04/14 0500 04/05/14 0439  Weight: 207 lb 0.2 oz (93.9 kg) 210 lb 5.1 oz (95.4 kg) 212 lb 15.4 oz (96.6 kg)    PHYSICAL EXAM  General: Pleasant, NAD. Slow to answer question Neuro: Alert and oriented X 3. Moves all extremities spontaneously. Psych: Normal affect. HEENT:  Normal  Neck:  Supple without bruits or JVD. Lungs:  Resp regular and unlabored, CTA. Heart: RRR no s3, s4, or murmurs. Abdomen: Soft, non-tender, non-distended, BS + x 4.  Extremities: No clubbing, cyanosis or edema. DP/PT/Radials 2+ and equal bilaterally. No significant peripheral edema  Accessory Clinical Findings  CBC  Recent Labs  04/04/14 0002 04/05/14 0257  WBC 7.3 7.5  HGB 12.1* 11.9*  HCT 37.4* 35.9*  MCV 91.7 92.8  PLT 159 962   Basic Metabolic Panel  Recent Labs  04/03/14 0234  04/04/14 0002 04/04/14 1130 04/05/14 0850  NA 132*  < > 134* 138 139  K 3.4*  < > 3.6* 3.8 3.8  CL 88*  < > 96 99 103  CO2 26  < > 26 26 23   GLUCOSE 464*  < > 243* 268* 97  BUN 65*  < > 55* 48* 36*  CREATININE 4.95*  < > 4.23* 3.60* 3.00*  CALCIUM 8.7  < > 8.4 9.1 9.2  MG 1.5  --   --   --   --   PHOS  --   --   --  3.0  --   < > = values in this interval not displayed. Liver Function Tests  Recent Labs  04/04/14 1130  ALBUMIN 2.5*  Cardiac Enzymes  Recent Labs  04/02/14 1256  TROPONINI <0.30   Hemoglobin A1C  Recent Labs  04/03/14 1120  HGBA1C 11.7*  Thyroid Function Tests  Recent Labs  04/03/14 0230  TSH 0.415    TELE  NSR, HR 81 bpm   Radiology/Studies  Ct Head Wo Contrast  04/02/2014   CLINICAL DATA:  Weakness and dizziness.  EXAM: CT HEAD WITHOUT CONTRAST  TECHNIQUE: Contiguous axial images were obtained from the base of the skull through the vertex without intravenous contrast.  COMPARISON:  None.  FINDINGS: The ventricles are in the midline and demonstrate normal configuration. No mass effect or shift. No extra-axial fluid collections are identified. Focal area of low attenuation in the left thymic area is likely an age indeterminate lacunar type infarct. No hemorrhage. No findings for hemispheric infarction. No mass lesions. The brainstem and cerebellum are grossly normal.  The bony structures are unremarkable. The paranasal sinuses and mastoid air cells are clear.   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  No findings for hemispheric infarction or intracranial hemorrhage.   Electronically Signed   By: Kalman Jewels M.D.   On: 04/02/2014 00:13   Mr Brain Wo Contrast  04/04/2014   CLINICAL DATA:  63 year old male with diabetes, hyperlipidemia, history of strokes and congestive heart failure. Dementia. Altered mental status. Very weak.  EXAM: MRI HEAD WITHOUT CONTRAST  TECHNIQUE: Multiplanar, multiecho pulse sequences of the brain and surrounding structures were obtained without intravenous contrast.  COMPARISON:  04/01/2014 CT.  No comparison MR.  FINDINGS: No acute infarct.  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  Minimal small vessel disease type changes.  No intracranial mass lesion noted on this unenhanced exam.  Mild atrophy without hydrocephalus.  Exophthalmos.  Major intracranial vascular structures are patent with diminutive size right vertebral artery.  Cervical medullary junction, pituitary region and pineal region unremarkable.  Minimal mucosal thickening maxillary sinuses.  IMPRESSION: No acute infarct.  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  Minimal small vessel disease type changes.  Mild atrophy without hydrocephalus.  Exophthalmos.  Major intracranial vascular structures are patent with diminutive size right vertebral artery.   Electronically Signed   By: Chauncey Cruel M.D.   On: 04/04/2014 09:48   US Renal  04/04/2014   CLINICAL DATA:  Hematuria.  Renal failure.  Diabetes.  EXAM: RENAL/URINARY TRACT ULTRASOUND COMPLETE  COMPARISON:  None.  FINDINGS: Right Kidney:  Length: 10.5 cm. Right kidney is echogenic consistent with chronic medical renal disease. No hydronephrosis . 3.4 cm simple cyst right upper renal pole. 2.5 cm simple cyst right mid kidney.  Left Kidney:  Length: 10.7 cm. Left kidney is echogenic consistent with chronic medical renal disease. No hydronephrosis .  There is a 2.2 x 1.8 x 1.7 cm complex cyst versus solid lesion left upper renal pole. CT or MRI should be considered for further evaluation. Renal cell carcinoma cannot be excluded.  Bladder:  Bladder is nondistended. Mild bladder wall thickening is present. Active bladder pathology cannot be excluded .  IMPRESSION: 1. 2.2 x 1.8 x 1.7 cm complex cyst versus solid lesion left upper renal pole. CT or MRI should be considered for further evaluation. Renal cell carcinoma cannot be excluded. 2. Mild bladder wall thickening . Cystoscopy should be considered to exclude significant bladder wall pathology. 3. Echogenic kidneys suggesting chronic medical renal disease. No hydronephrosis or bladder distention .   Electronically Signed  By: [place]   On: 04/04/2014 07:49   Dg Chest Port 1 View  04/03/2014   CLINICAL DATA:  Hypoxia.  EXAM: PORTABLE CHEST - 1 VIEW  COMPARISON:  Chest x-ray 04/01/2014.  FINDINGS: Mediastinum and hilar structures are normal. Stable cardiomegaly with normal pulmonary vascularity. No pleural effusion or pneumothorax. No acute osseous abnormality .  IMPRESSION: 1. Stable cardiomegaly. 2. No acute cardiopulmonary disease.   Electronically Signed   By: Marcello Moores  Register   On: 04/03/2014 07:50   Dg Chest Port 1 View  04/01/2014   CLINICAL DATA:  Altered mental status; history of coronary artery disease.  EXAM: PORTABLE CHEST - 1 VIEW  COMPARISON:  None.  FINDINGS: The cardiac silhouette is enlarged. A calcified vessel or stent is present over the left cardiac apex. The pulmonary vascularity is not engorged. The lungs are hypoinflated. There is no pleural effusion. There is degenerative change of the left glenohumeral joint. New  IMPRESSION: There is no evidence of aspiration. The cardiac silhouette is enlarged without evidence of pulmonary edema.   Electronically Signed   By: David  [place]   On: 04/01/2014 23:46    ASSESSMENT AND PLAN  New patient to [facility], prior h/o CAD with chronic  systolic and diastolic dysfunction and h/o CKD initially came in with AMS. Initially noted to have mildly elevated troponin, however does not appears to be significant as patient has no HF symptom, no anginal sx and echo shows no decrease in EF. CT and MRI confirmed remote infarct however no new infarct. AMS resolved, believe due to multifactorial etiology. Acute on chronic renal insufficiency followed by nephrology. Patient went into presumably new onset a-fib with RVR shortly after midnight in the morning of 6/8. Heparin gtt initiated. Renal u/s revealed a 1.7 cm cyst vs mass, urology consulted. Holding off systemic anticoagulation pending possible urology workup.   1. New onset a-fib/flutter with RVR (per tele start shortly after midnight in the morning of 6/8, no prior knowledge of arrhythmia)  - He is now in NSR. HR 81 BPM  - Continue 25 mg of Metoprolol  BID  - CHA2DS2-Vasc score 5-6 (CHF, DM, stroke, prior MI +/- HTN)  - urology has planned for OP w/u. ? starting coumadin today with heparin bridge. NOAC not ideal choice given worsening renal function. His goal INR is 2-3.     2. 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 CAD s/p multiple stents and follow by Dr. Celine Ahr at [facility] (total of 10 stents per son)  - PCI LAD/RCA in 2007  - PCI LAD/RCA in 2009 at [facility]  - Abnormal lexiscan stress test 06/2013 moderate reversible perfusion abnormality in anterior, septal and apical region  - PCI LAD 08/2013   - Plavix discontinued for unclear reason (pending urology workup?), given PCI in 08/2013. May need to resume once bleed risk low. Can consider plavix + coumadin on discharge given  recent PCI and a-fib  4. History of combined systolic and diastolic dysfunction  - Echo at [facility] 02/09/2013 EF 35-40%, grade II diastolic dysfunction, 4-0+ mitral regurg, RVSP 69mmHg   - Echo at [facility] 11/17/2013 Ef <25%, severe hypokinesis of LV, moderately dilated LA, grade IV/IV diastolic  dysfunction with irreversible restrictive pattern of mitral inflow, severe pulm HTN (RVSP >74mmHg), 1-2+ MR  - [facility]NP at [facility] 03/14/2014 >13000  - Echo at [facility] 04/02/14 EF 25-30%, moderate concentric LVH, diffuse hypokinesis, moderately dilated LA, no significant MR observed.  - no sign of acute HF on exam, nephrology following. Not on lasix at this time, however pt states he was on lasix at home (not documented).   - Continue to monitor fluid status for now  5. Acute on chronic renal insufficiency  - baseline 2.10 on 03/14/14 at [place], present with Cr 3.00 (improving, 3.60 yesterday)  - nephrology following  6. Mildly elevated troponin on admission  - likely related to intracranial etiology and CKD  - no anginal symptom to suggest related to CAD  7. DM: Management per IM  8. Abnormal renal US with L renal complex cyst v solid lesion and bladder wall thickening  - Urology consulted for possible cystoscopy  Signed, Lyda Jester PA-C 04/05/2014 12:31 PM   Patient seen and examined. Agree with assessment and plan. Cr improved to 3.0. Further Urology eval as outpatient with subsequent repeat renal US and cyctoscopy in future if persistent microscopic hematuria.  Will start coumadin today for PAF. Currently maintaining NSR.   Troy Sine, MD, [facility] 04/05/2014 1:30 PM

## 2014-04-05 NOTE — Progress Notes (Signed)
Assessment:  1 Acute Kidney Injury, hemodynamically mediated due to hypotension, A fib and osmotic diuresis/hyperglycemia--improving  2 Stage 4 CKD, prob due to hypertension  3 Microhematuria, thickening of bladder wall and complex cyst l upper pole per GU Plan:  We will sign off. Pt to f/u with his Nephrologist   Subjective: Interval History: NSR  Objective: Vital signs in last 24 hours: Temp:  [97.7 F (36.5 C)-98.8 F (37.1 C)] 98 F (36.7 C) (06/10 0755) Pulse Rate:  [73-105] 89 (06/10 0755) Resp:  [13-18] 16 (06/10 0755) BP: (125-144)/(71-91) 133/71 mmHg (06/10 0755) SpO2:  [96 %-100 %] 100 % (06/10 0755) Weight:  [96.6 kg (212 lb 15.4 oz)] 96.6 kg (212 lb 15.4 oz) (06/10 0439) Weight change: 1.2 kg (2 lb 10.3 oz)  Intake/Output from previous day: 06/09 0701 - 06/10 0700 In: 1449.9 [P.O.:240; I.V.:1209.9] Out: 1450 [Urine:1450] Intake/Output this shift: Total I/O In: 71.8 [I.V.:71.8] Out: 300 [Urine:300]  General appearance: alert and cooperative Resp: clear to auscultation bilaterally Chest wall: no tenderness Cardio: regular rate and rhythm, S1, S2 normal, no murmur, click, rub or gallop Extremities: extremities normal, atraumatic, no cyanosis or edema  Lab Results:  Recent Labs  04/04/14 0002 04/05/14 0257  WBC 7.3 7.5  HGB 12.1* 11.9*  HCT 37.4* 35.9*  PLT 159 160   BMET:  Recent Labs  04/04/14 1130 04/05/14 0850  NA 138 139  K 3.8 3.8  CL 99 103  CO2 26 23  GLUCOSE 268* 97  BUN 48* 36*  CREATININE 3.60* 3.00*  CALCIUM 9.1 9.2   No results found for this basename: PTH,  in the last 72 hours Iron Studies: No results found for this basename: IRON, TIBC, TRANSFERRIN, FERRITIN,  in the last 72 hours Studies/Results: Mr Brain Wo Contrast  04/04/2014   CLINICAL DATA:  63 year old male with diabetes, hyperlipidemia, history of strokes and congestive heart failure. Dementia. Altered mental status. Very weak.  EXAM: MRI HEAD WITHOUT CONTRAST   TECHNIQUE: Multiplanar, multiecho pulse sequences of the brain and surrounding structures were obtained without intravenous contrast.  COMPARISON:  04/01/2014 CT.  No comparison MR.  FINDINGS: No acute infarct.  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  Minimal small vessel disease type changes.  No intracranial mass lesion noted on this unenhanced exam.  Mild atrophy without hydrocephalus.  Exophthalmos.  Major intracranial vascular structures are patent with diminutive size right vertebral artery.  Cervical medullary junction, pituitary region and pineal region unremarkable.  Minimal mucosal thickening maxillary sinuses.  IMPRESSION: No acute infarct.  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  Minimal small vessel disease type changes.  Mild atrophy without hydrocephalus.  Exophthalmos.  Major intracranial vascular structures are patent with diminutive size right vertebral artery.   Electronically Signed   By: Chauncey Cruel M.D.   On: 04/04/2014 09:48   US Renal  04/04/2014   CLINICAL DATA:  Hematuria.  Renal failure.  Diabetes.  EXAM: RENAL/URINARY TRACT ULTRASOUND COMPLETE  COMPARISON:  None.  FINDINGS: Right Kidney:  Length: 10.5 cm. Right kidney is echogenic consistent with chronic medical renal disease. No hydronephrosis . 3.4 cm simple cyst right upper renal pole. 2.5 cm simple cyst right mid kidney.  Left Kidney:  Length: 10.7 cm. Left kidney is echogenic consistent with chronic medical renal disease. No hydronephrosis . There is a 2.2 x 1.8 x 1.7 cm complex cyst versus solid lesion left upper renal pole. CT or MRI should be considered for  further evaluation. Renal cell carcinoma cannot be excluded.  Bladder:  Bladder is nondistended. Mild bladder wall thickening is present. Active bladder pathology cannot be excluded .  IMPRESSION: 1. 2.2 x 1.8 x 1.7 cm complex cyst versus solid lesion left upper renal pole. CT or MRI should be considered for further  evaluation. Renal cell carcinoma cannot be excluded. 2. Mild bladder wall thickening . Cystoscopy should be considered to exclude significant bladder wall pathology. 3. Echogenic kidneys suggesting chronic medical renal disease. No hydronephrosis or bladder distention .   Electronically Signed   By: Marcello Moores  Register   On: 04/04/2014 07:49    Scheduled: . aspirin EC  81 mg Oral Daily  . atorvastatin  40 mg Oral Daily  . insulin aspart  0-15 Units Subcutaneous TID WC  . insulin aspart  0-5 Units Subcutaneous QHS  . insulin aspart  10 Units Subcutaneous TID WC  . insulin glargine  45 Units Subcutaneous QHS  . metoprolol tartrate  25 mg Oral BID  . sodium chloride  3 mL Intravenous Q12H     LOS: 4 days   Tony Freeman C 04/05/2014,10:00 AM

## 2014-04-05 NOTE — Progress Notes (Addendum)
PROGRESS NOTE  Tony Freeman YDX:[id number] DOB: November 24, 1950 DOA: 04/01/2014 PCP: No primary provider on file.  Assessment/Plan: Encephalopathy; resolved, likely multifactorial, metabolic secondary to hyperglycemia, electrolytes abnormalities. MRI negative for acute infarct.    Lactic acidosis- resovled. Infectious workup negative.  Off antibiotics  Elevated troponin; no evidence for ACS. Likely related to CKD/ARF/acute illness -cardiology signed off -cycle troponin.  Echo from [place]: 2015: from [facility]  The left ventricle is normal in size. There is mild concentric left ventricular hypertrophy.The left ventricular ejection fraction is severely reduced (<25%). There is severe diffuse hypokinesis of the left ventricle. There is severe (Grade IV/IV) diastolic dysfunction; irreversible restrictive pattern of mitral inflow.  There is also a cardiac cath from 2014   Atrial fib - appears new Now in sinus. Will start warfarin and  teaching  Acute Renal Failure, prior history of CKD.  Resolving.  Nephrology has signed off  Abnormal renal US with left renal complex cyst v. Solid lesion, also bladder wall thickening.   Discussed with radiologist and viewed MRI of the abdomen in care everywhere from 2013. On previous study, multiple renal cysts noted. Given patient's renal failure and inability to have contrast for either CAT scan or MRI, either study may be low yield. Repeat US in 3 months.  Urology recommends outpt follow up. Resume plavix, as no procedure needed.  Diabetes, uncontrolled diabetes with renal manifestations CBGs better.   hypomagnesium -repleted  Leukocytosis -resolved    Essential hypertension, benign: continue metoprolol.    Chronic systolic heart failure, compensated    CAD (coronary artery disease)    Other and unspecified hyperlipidemia    Microscopic hematuria    Abnormal brain CT/age indeterminate lacunar infarct  Transfer to  telemetry  Code Status: full Family Communication: patient Disposition Plan: home   Consultants:  nephro  Cards  urology  Procedures:      HPI/Subjective: No complaints.   Objective: Filed Vitals:   04/05/14 1134  BP: 123/85  Pulse: 81  Temp: 98 F (36.7 C)  Resp: 15    Intake/Output Summary (Last 24 hours) at 04/05/14 1206 Last data filed at 04/05/14 1134  Gross per 24 hour  Intake   1680 ml  Output   1500 ml  Net    180 ml   Filed Weights   04/03/14 0336 04/04/14 0500 04/05/14 0439  Weight: 93.9 kg (207 lb 0.2 oz) 95.4 kg (210 lb 5.1 oz) 96.6 kg (212 lb 15.4 oz)    telemetry: NSR  Exam:   General:  A+Ox3, NAD  Cardiovascular: RRR  Respiratory: clear, no wheezing, rales or rhonchi  Abdomen: +BS, soft  Extremities: no edema   Neurologic: Nonfocal.  Data Reviewed: Basic Metabolic Panel:  Recent Labs Lab 04/02/14 0600 04/03/14 0234 04/03/14 1120 04/04/14 0002 04/04/14 1130 04/05/14 0850  NA 133* 132* 135* 134* 138 139  K 3.8 3.4* 3.9 3.6* 3.8 3.8  CL 88* 88* 96 96 99 103  CO2 31 26 25 26 26 23   GLUCOSE 334* 464* 261* 243* 268* 97  BUN 58* 65* 64* 55* 48* 36*  CREATININE 4.05* 4.95* 4.85* 4.23* 3.60* 3.00*  CALCIUM 9.6 8.7 8.5 8.4 9.1 9.2  MG  --  1.5  --   --   --   --   PHOS  --   --   --   --  3.0  --    Liver Function Tests:  Recent Labs Lab 04/01/14 2300 04/04/14 1130  AST 34  --  ALT 35  --   ALKPHOS 173*  --   BILITOT 1.8*  --   PROT 7.6  --   ALBUMIN 3.4* 2.5*   No results found for this basename: LIPASE, AMYLASE,  in the last 168 hours  Recent Labs Lab 04/02/14 0143  AMMONIA 28   CBC:  Recent Labs Lab 04/01/14 2300 04/02/14 0600 04/03/14 0234 04/04/14 0002 04/05/14 0257  WBC 11.3* 9.1 8.5 7.3 7.5  HGB 14.9 13.7 13.6 12.1* 11.9*  HCT 43.2 39.3 40.3 37.4* 35.9*  MCV 88.7 88.5 89.6 91.7 92.8  PLT 188 157 166 159 160   Cardiac Enzymes:  Recent Labs Lab 04/02/14 0130 04/02/14 0600  04/02/14 1256  TROPONINI <0.30 <0.30 <0.30   BNP (last 3 results) No results found for this basename: PROBNP,  in the last 8760 hours CBG:  Recent Labs Lab 04/04/14 1139 04/04/14 1636 04/04/14 2105 04/05/14 0812 04/05/14 1157  GLUCAP 267* 216* 155* 104* 128*    Recent Results (from the past 240 hour(s))  CULTURE, BLOOD (ROUTINE X 2)     Status: None   Collection Time    04/02/14 12:17 AM      Result Value Ref Range Status   Specimen Description BLOOD RIGHT ARM   Final   Special Requests     Final   Value: BOTTLES DRAWN AEROBIC AND ANAEROBIC 10CC Immunocompromised   Culture  Setup Time     Final   Value: 04/02/2014 13:44     Performed at [facility]   Culture     Final   Value:        BLOOD CULTURE RECEIVED NO GROWTH TO DATE CULTURE WILL BE HELD FOR 5 DAYS BEFORE ISSUING A FINAL NEGATIVE REPORT     Performed at [facility]   Report Status PENDING   Incomplete  CULTURE, BLOOD (ROUTINE X 2)     Status: None   Collection Time    04/02/14 12:31 AM      Result Value Ref Range Status   Specimen Description BLOOD LEFT SHOULDER   Final   Special Requests BOTTLES DRAWN AEROBIC ONLY 5CC Immunocompromised   Final   Culture  Setup Time     Final   Value: 04/02/2014 13:44     Performed at [facility]   Culture     Final   Value:        BLOOD CULTURE RECEIVED NO GROWTH TO DATE CULTURE WILL BE HELD FOR 5 DAYS BEFORE ISSUING A FINAL NEGATIVE REPORT     Performed at [facility]   Report Status PENDING   Incomplete  MRSA PCR SCREENING     Status: None   Collection Time    04/02/14  3:27 AM      Result Value Ref Range Status   MRSA by PCR NEGATIVE  NEGATIVE Final   Comment:            The GeneXpert MRSA Assay (FDA     approved for NASAL specimens     only), is one component of a     comprehensive MRSA colonization     surveillance program. It is not     intended to diagnose MRSA     infection nor to guide or     monitor treatment for      MRSA infections.     Studies: Mr Herby Abraham Contrast  04/04/2014   CLINICAL DATA:  63 year old male with diabetes, hyperlipidemia, history of strokes and  congestive heart failure. Dementia. Altered mental status. Very weak.  EXAM: MRI HEAD WITHOUT CONTRAST  TECHNIQUE: Multiplanar, multiecho pulse sequences of the brain and surrounding structures were obtained without intravenous contrast.  COMPARISON:  04/01/2014 CT.  No comparison MR.  FINDINGS: No acute infarct.  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  Minimal small vessel disease type changes.  No intracranial mass lesion noted on this unenhanced exam.  Mild atrophy without hydrocephalus.  Exophthalmos.  Major intracranial vascular structures are patent with diminutive size right vertebral artery.  Cervical medullary junction, pituitary region and pineal region unremarkable.  Minimal mucosal thickening maxillary sinuses.  IMPRESSION: No acute infarct.  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  Minimal small vessel disease type changes.  Mild atrophy without hydrocephalus.  Exophthalmos.  Major intracranial vascular structures are patent with diminutive size right vertebral artery.   Electronically Signed   By: Chauncey Cruel M.D.   On: 04/04/2014 09:48   US Renal  04/04/2014   CLINICAL DATA:  Hematuria.  Renal failure.  Diabetes.  EXAM: RENAL/URINARY TRACT ULTRASOUND COMPLETE  COMPARISON:  None.  FINDINGS: Right Kidney:  Length: 10.5 cm. Right kidney is echogenic consistent with chronic medical renal disease. No hydronephrosis . 3.4 cm simple cyst right upper renal pole. 2.5 cm simple cyst right mid kidney.  Left Kidney:  Length: 10.7 cm. Left kidney is echogenic consistent with chronic medical renal disease. No hydronephrosis . There is a 2.2 x 1.8 x 1.7 cm complex cyst versus solid lesion left upper renal pole. CT or MRI should be considered for further evaluation. Renal cell carcinoma cannot be excluded.   Bladder:  Bladder is nondistended. Mild bladder wall thickening is present. Active bladder pathology cannot be excluded .  IMPRESSION: 1. 2.2 x 1.8 x 1.7 cm complex cyst versus solid lesion left upper renal pole. CT or MRI should be considered for further evaluation. Renal cell carcinoma cannot be excluded. 2. Mild bladder wall thickening . Cystoscopy should be considered to exclude significant bladder wall pathology. 3. Echogenic kidneys suggesting chronic medical renal disease. No hydronephrosis or bladder distention .   Electronically Signed   By: Marcello Moores  Register   On: 04/04/2014 07:49    Scheduled Meds: . aspirin EC  81 mg Oral Daily  . atorvastatin  40 mg Oral Daily  . insulin aspart  0-15 Units Subcutaneous TID WC  . insulin aspart  0-5 Units Subcutaneous QHS  . insulin aspart  10 Units Subcutaneous TID WC  . insulin glargine  45 Units Subcutaneous QHS  . metoprolol tartrate  25 mg Oral BID  . sodium chloride  3 mL Intravenous Q12H   Continuous Infusions: . sodium chloride 10 mL/hr at 04/04/14 1900  . heparin 1,050 Units/hr (04/05/14 0830)   Antibiotics Given (last 72 hours)   Date/Time Action Medication Dose Rate   04/02/14 1422 Given   piperacillin-tazobactam (ZOSYN) IVPB 2.25 g 2.25 g 100 mL/hr   04/02/14 2118 Given   piperacillin-tazobactam (ZOSYN) IVPB 2.25 g 2.25 g 100 mL/hr     Time spent: 25 min  Delfina Redwood, MD [facility] Pager [phone]. If 7PM-7AM, please contact night-coverage at [email or web address], password [facility] 04/05/2014, 12:06 PM  LOS: 4 days

## 2014-04-06 DIAGNOSIS — E1129 Type 2 diabetes mellitus with other diabetic kidney complication: Secondary | ICD-10-CM

## 2014-04-06 DIAGNOSIS — R9389 Abnormal findings on diagnostic imaging of other specified body structures: Secondary | ICD-10-CM

## 2014-04-06 DIAGNOSIS — E1165 Type 2 diabetes mellitus with hyperglycemia: Secondary | ICD-10-CM

## 2014-04-06 LAB — BASIC METABOLIC PANEL
BUN: 33 mg/dL — ABNORMAL HIGH (ref 6–23)
CO2: 23 mEq/L (ref 19–32)
Calcium: 9 mg/dL (ref 8.4–10.5)
Chloride: 99 mEq/L (ref 96–112)
Creatinine, Ser: 2.93 mg/dL — ABNORMAL HIGH (ref 0.50–1.35)
GFR calc Af Amer: 25 mL/min — ABNORMAL LOW (ref >90)
GFR, EST NON AFRICAN AMERICAN: 22 mL/min — AB (ref >90)
GLUCOSE: 132 mg/dL — AB (ref 70–99)
POTASSIUM: 4.1 meq/L (ref 3.7–5.3)
SODIUM: 137 meq/L (ref 137–147)

## 2014-04-06 LAB — GLUCOSE, CAPILLARY
GLUCOSE-CAPILLARY: 111 mg/dL — AB (ref 70–99)
Glucose-Capillary: 89 mg/dL (ref 70–99)
Glucose-Capillary: 149 mg/dL — ABNORMAL HIGH (ref 70–99)
Glucose-Capillary: 130 mg/dL — ABNORMAL HIGH (ref 70–99)
Glucose-Capillary: 63 mg/dL — ABNORMAL LOW (ref 70–99)

## 2014-04-06 LAB — CBC
HCT: 34.4 % — ABNORMAL LOW (ref 39.0–52.0)
HEMOGLOBIN: 11.1 g/dL — AB (ref 13.0–17.0)
MCH: 30.1 pg (ref 26.0–34.0)
MCHC: 32.3 g/dL (ref 30.0–36.0)
MCV: 93.2 fL (ref 78.0–100.0)
Platelets: 163 10*3/uL (ref 150–400)
RBC: 3.69 MIL/uL — ABNORMAL LOW (ref 4.22–5.81)
RDW: 13.1 % (ref 11.5–15.5)
WBC: 7.5 10*3/uL (ref 4.0–10.5)

## 2014-04-06 LAB — PROTIME-INR
INR: 0.93 (ref 0.00–1.49)
Prothrombin Time: 12.3 seconds (ref 11.6–15.2)

## 2014-04-06 LAB — HEPARIN LEVEL (UNFRACTIONATED)
HEPARIN UNFRACTIONATED: 0.4 [IU]/mL (ref 0.30–0.70)
Heparin Unfractionated: 0.28 IU/mL — ABNORMAL LOW (ref 0.30–0.70)
Heparin Unfractionated: 0.47 IU/mL (ref 0.30–0.70)

## 2014-04-06 MED ORDER — WARFARIN SODIUM 7.5 MG PO TABS
7.5000 mg | ORAL_TABLET | Freq: Once | ORAL | Status: AC
  Administered 2014-04-06: 7.5 mg via ORAL
  Filled 2014-04-06: qty 1

## 2014-04-06 NOTE — Progress Notes (Signed)
Subjective: No SOB or CP  Objective: Vital signs in last 24 hours: Temp:  [97.9 F (36.6 C)-98.6 F (37 C)] 98 F (36.7 C) (06/11 0457) Pulse Rate:  [66-81] 74 (06/11 0457) Resp:  [15-18] 18 (06/11 0457) BP: (123-146)/(85-92) 133/92 mmHg (06/11 0457) SpO2:  [99 %-100 %] 99 % (06/11 0457) Weight:  [212 lb 4.9 oz (96.3 kg)] 212 lb 4.9 oz (96.3 kg) (06/11 0457) Last BM Date: 04/06/14  Intake/Output from previous day: 06/10 0701 - 06/11 0700 In: 1374.5 [P.O.:1200; I.V.:174.5] Out: 1200 [Urine:1200] Intake/Output this shift: Total I/O In: 240 [P.O.:240] Out: -   Medications Current Facility-Administered Medications  Medication Dose Route Frequency Provider Last Rate Last Dose  . 0.9 %  sodium chloride infusion  250 mL Intravenous PRN Belkys A Regalado, MD      . 0.9 %  sodium chloride infusion   Intravenous Continuous Delfina Redwood, MD 10 mL/hr at 04/04/14 1900    . acetaminophen (TYLENOL) tablet 650 mg  650 mg Oral Q6H PRN Gardiner Barefoot, NP   650 mg at 04/05/14 0913  . atorvastatin (LIPITOR) tablet 40 mg  40 mg Oral Daily Geradine Girt, DO   40 mg at 04/05/14 0909  . clopidogrel (PLAVIX) tablet 75 mg  75 mg Oral Q breakfast Delfina Redwood, MD   75 mg at 04/06/14 0713  . heparin ADULT infusion 100 units/mL (25000 units/250 mL)  1,250 Units/hr Intravenous Continuous Anh P Pham, RPH 11.5 mL/hr at 04/06/14 0819 1,150 Units/hr at 04/06/14 0819  . insulin aspart (novoLOG) injection 0-15 Units  0-15 Units Subcutaneous TID WC Samella Parr, NP   2 Units at 04/05/14 1236  . insulin aspart (novoLOG) injection 0-5 Units  0-5 Units Subcutaneous QHS Samella Parr, NP   4 Units at 04/03/14 2145  . insulin aspart (novoLOG) injection 10 Units  10 Units Subcutaneous TID WC Delfina Redwood, MD   10 Units at 04/06/14 0820  . insulin glargine (LANTUS) injection 45 Units  45 Units Subcutaneous QHS Delfina Redwood, MD   45 Units at 04/05/14 2326  . metoprolol tartrate  (LOPRESSOR) tablet 25 mg  25 mg Oral BID Troy Sine, MD   25 mg at 04/05/14 2330  . ondansetron (ZOFRAN) tablet 4 mg  4 mg Oral Q6H PRN Belkys A Regalado, MD       Or  . ondansetron (ZOFRAN) injection 4 mg  4 mg Intravenous Q6H PRN Belkys A Regalado, MD      . sodium chloride 0.9 % injection 3 mL  3 mL Intravenous Q12H Belkys A Regalado, MD   3 mL at 04/05/14 0910  . sodium chloride 0.9 % injection 3 mL  3 mL Intravenous PRN Belkys A Regalado, MD      . warfarin (COUMADIN) tablet 7.5 mg  7.5 mg Oral ONCE-1800 Anh P Pham, RPH      . Warfarin - Pharmacist Dosing Inpatient   Does not apply [place], [facility]        PE: General appearance: alert, cooperative and no distress Lungs: clear to auscultation bilaterally Heart: regular rate and rhythm, S1, S2 normal, no murmur, click, rub or gallop Abdomen: nontender, +BS Extremities: No LEE Pulses: 2+ and symmetric Skin: Warm nd dry Neurologic: Grossly normal  Lab Results:   Recent Labs  04/04/14 0002 04/05/14 0257 04/06/14 0426  WBC 7.3 7.5 7.5  HGB 12.1* 11.9* 11.1*  HCT 37.4* 35.9* 34.4*  PLT 159  160 163   BMET  Recent Labs  04/04/14 1130 04/05/14 0850 04/06/14 0426  NA 138 139 137  K 3.8 3.8 4.1  CL 99 103 99  CO2 26 23 23   GLUCOSE 268* 97 132*  BUN 48* 36* 33*  CREATININE 3.60* 3.00* 2.93*  CALCIUM 9.1 9.2 9.0   PT/INR  Recent Labs  04/05/14 1346 04/06/14 0426  LABPROT 11.9 12.3  INR 0.89 0.93    Assessment/Plan 1. New onset a-fib/flutter with RVR (per tele start shortly after midnight in the morning of 6/8, no prior knowledge of arrhythmia)  - maitaining NSR. HR 81 BPM.  He did have a very short run of SVT.  3 beat NSVT - On  25 mg of Metoprolol BID.  Consider titrating to 37.5 BID.  His BP will support it.  - CHA2DS2-Vasc score 5-6  - On coumadin.  INR 0.93  2. Acute Encephalopathy - resolved, likely multifactorial per IM  - CT of brain 04/02/14 age indeterminate L laminae lacunar infarct   - MRI of brain 04/04/14 no acute infarct, remote small infarct anterior medial aspect of L thalamus  - per primary cardiologist note on 10/15/2003, has old cerebral infarct with cognitive deficit  3. CAD s/p multiple stents and follow by Dr. Celine Ahr at [facility] (total of 10 stents per son)  - PCI LAD/RCA in 2007  - PCI LAD/RCA in 2009 at [facility]  - Abnormal lexiscan stress test 06/2013 moderate reversible perfusion abnormality in anterior, septal and apical region  - PCI LAD 08/2013  - Plavix discontinued for unclear reason (pending urology workup?), given PCI in 08/2013. May need to resume once bleed risk low. Can consider plavix + coumadin on discharge given recent PCI and a-fib  4. History of combined systolic and diastolic dysfunction  -He appears euvolemic. No dyspnea.  He likely will need daily or PRN lasix once in his home environment.  Daily weights and sodium restriction are very important.   - Continue to monitor fluid status for now  -Echo at [facility] 02/09/2013 EF 35-40%, grade II diastolic dysfunction, 6-6+ mitral regurg, RVSP 59mmHg  - Echo at [facility] 11/17/2013 Ef <25%, severe hypokinesis of LV, moderately dilated LA, grade IV/IV diastolic dysfunction with irreversible restrictive pattern of mitral inflow, severe pulm HTN (RVSP >68mmHg), 1-2+ MR  - proBNP at [facility] 03/14/2014 >13000  - Echo at [facility] 04/02/14 EF 25-30%, moderate concentric LVH, diffuse hypokinesis, moderately dilated LA, no significant MR observed.   5. Acute on chronic renal insufficiency  - baseline 2.10 on 03/14/14 at [place],  SCr 2.93  - nephrology following  6. Mildly elevated troponin on admission  - 0.11.  All subsequent trop negative.   likely related to intracranial etiology and CKD  - no anginal symptom to suggest related to CAD  7. DM: Uncontrolled.  Management per IM.  8. Abnormal renal US with L renal complex cyst v solid lesion and bladder wall thickening  - Urology consulted     LOS: 5 days    HAGER, BRYAN PA-C 04/06/2014 11:27 AM   Patient seen and examined. Agree with assessment and plan. Cr 2.93 today. Slightly improved. Maintaining NSR. Getting coumadin and on heparin. If last PCI was 11/14 would dc asa continue plavix, coumadin but ultimately dc plavix and continue with ASA 81 mg/coumdin long term after 1 yr if DES stent was inserted in 11/14. Will increase lopressor to 37.5 mg bid and slowly titrate with EF 25 - 30%.  Troy Sine, MD, [facility] 04/06/2014 1:01 PM

## 2014-04-06 NOTE — Progress Notes (Signed)
ANTICOAGULATION CONSULT NOTE - Follow Up Consult  Pharmacy Consult for heparin Indication: atrial fibrillation  No Known Allergies  Patient Measurements: Height: 5\' 6"  (167.6 cm) Weight: 212 lb 4.9 oz (96.3 kg) IBW/kg (Calculated) : 63.8 Heparin dosing weight: 85 kg   Vital Signs: Temp: 98 F (36.7 C) (06/11 2041) Temp src: Oral (06/11 2041) BP: 142/81 mmHg (06/11 2041) Pulse Rate: 73 (06/11 2041)  Labs:  Recent Labs  04/04/14 0002 04/04/14 1130 04/05/14 0257 04/05/14 0850 04/05/14 1346 04/05/14 2310 04/06/14 0426 04/06/14 0850 04/06/14 1950  HGB 12.1*  --  11.9*  --   --   --  11.1*  --   --   HCT 37.4*  --  35.9*  --   --   --  34.4*  --   --   PLT 159  --  160  --   --   --  163  --   --   LABPROT  --   --   --   --  11.9  --  12.3  --   --   INR  --   --   --   --  0.89  --  0.93  --   --   HEPARINUNFRC 0.55  --  0.25*  --  0.28* 0.40  --  0.28* 0.47  CREATININE 4.23* 3.60*  --  3.00*  --   --  2.93*  --   --     Estimated Creatinine Clearance: 28.8 ml/min (by C-G formula based on Cr of 2.93).  Assessment: Patient is a 63 y.o M on anticoagulation for new onset afib.  Heparin level is slightly subtherapeutic at 0.28>>0.4 now in goal range.  Goal of Therapy:  INR 2-3 Heparin level 0.3-0.7 units/ml Monitor platelets by anticoagulation protocol: Yes   Plan:  1) Contine heparin drip at 1250 units/hr   Alpha Mysliwiec S. Alford Highland, PharmD, BCPS Clinical Staff Pharmacist Pager [phone]  Eilene Ghazi Stillinger 04/06/2014,8:50 PM

## 2014-04-06 NOTE — Discharge Instructions (Signed)
Information on my medicine - Coumadin   (Warfarin)  This medication education was reviewed with me or my healthcare representative as part of my discharge preparation.  The pharmacist that spoke with me during my hospital stay was:  Lynelle Doctor, [facility]  Why was Coumadin prescribed for you? Coumadin was prescribed for you because you have a blood clot or a medical condition that can cause an increased risk of forming blood clots. Blood clots can cause serious health problems by blocking the flow of blood to the heart, lung, or brain. Coumadin can prevent harmful blood clots from forming. As a reminder your indication for Coumadin is:   Stroke Prevention Because Of Atrial Fibrillation  What test will check on my response to Coumadin? While on Coumadin (warfarin) you will need to have an INR test regularly to ensure that your dose is keeping you in the desired range. The INR (international normalized ratio) number is calculated from the result of the laboratory test called prothrombin time (PT).  If an INR APPOINTMENT HAS NOT ALREADY BEEN MADE FOR YOU please schedule an appointment to have this lab work done by your health care provider within 7 days. Your INR goal is: 2-3  What  do you need to  know  About  COUMADIN? Take Coumadin (warfarin) exactly as prescribed by your healthcare provider about the same time each day.  DO NOT stop taking without talking to the doctor who prescribed the medication.  Stopping without other blood clot prevention medication to take the place of Coumadin may increase your risk of developing a new clot or stroke.  Get refills before you run out.  What do you do if you miss a dose? If you miss a dose, take it as soon as you remember on the same day then continue your regularly scheduled regimen the next day.  Do not take two doses of Coumadin at the same time.  Important Safety Information A possible side effect of Coumadin (Warfarin) is an increased risk of bleeding.  You should call your healthcare provider right away if you experience any of the following:   Bleeding from an injury or your nose that does not stop.   Unusual colored urine (red or dark brown) or unusual colored stools (red or black).   Unusual bruising for unknown reasons.   A serious fall or if you hit your head (even if there is no bleeding).  Some foods or medicines interact with Coumadin (warfarin) and might alter your response to warfarin. To help avoid this:   Eat a balanced diet, maintaining a consistent amount of Vitamin K.   Notify your provider about major diet changes you plan to make.   Avoid alcohol or limit your intake to 1 drink for women and 2 drinks for men per day. (1 drink is 5 oz. wine, 12 oz. beer, or 1.5 oz. liquor.)  Make sure that ANY health care provider who prescribes medication for you knows that you are taking Coumadin (warfarin).  Also make sure the healthcare provider who is monitoring your Coumadin knows when you have started a new medication including herbals and non-prescription products.  Coumadin (Warfarin)  Major Drug Interactions  Increased Warfarin Effect Decreased Warfarin Effect  Alcohol (large quantities) Antibiotics (esp. Septra/Bactrim, Flagyl, Cipro) Amiodarone (Cordarone) Aspirin (ASA) Cimetidine (Tagamet) Megestrol (Megace) NSAIDs (ibuprofen, naproxen, etc.) Piroxicam (Feldene) Propafenone (Rythmol SR) Propranolol (Inderal) Isoniazid (INH) Posaconazole (Noxafil) Barbiturates (Phenobarbital) Carbamazepine (Tegretol) Chlordiazepoxide (Librium) Cholestyramine (Questran) Griseofulvin Oral Contraceptives Rifampin Sucralfate (Carafate)  Vitamin K   Coumadin (Warfarin) Major Herbal Interactions  Increased Warfarin Effect Decreased Warfarin Effect  Garlic Ginseng Ginkgo biloba Coenzyme Q10 Green tea St. Johns wort    Coumadin (Warfarin) FOOD Interactions  Eat a consistent number of servings per week of foods HIGH in Vitamin  K (1 serving =  cup)  Collards (cooked, or boiled & drained) Kale (cooked, or boiled & drained) Mustard greens (cooked, or boiled & drained) Parsley *serving size only =  cup Spinach (cooked, or boiled & drained) Swiss chard (cooked, or boiled & drained) Turnip greens (cooked, or boiled & drained)  Eat a consistent number of servings per week of foods MEDIUM-HIGH in Vitamin K (1 serving = 1 cup)  Asparagus (cooked, or boiled & drained) Broccoli (cooked, boiled & drained, or raw & chopped) Brussel sprouts (cooked, or boiled & drained) *serving size only =  cup Lettuce, raw (green leaf, endive, romaine) Spinach, raw Turnip greens, raw & chopped   These websites have more information on Coumadin (warfarin):  FailFactory.se; VeganReport.com.au;

## 2014-04-06 NOTE — Progress Notes (Signed)
PROGRESS NOTE  Tony Freeman VOJ:[id number] DOB: May 07, 1951 DOA: 04/01/2014 PCP: No primary provider on file.  Assessment/Plan: Encephalopathy; resolved, likely multifactorial, metabolic secondary to hyperglycemia, electrolytes abnormalities. MRI negative for acute infarct.    Lactic acidosis- resovled. Infectious workup negative.  Off antibiotics  Elevated troponin; no evidence for ACS. Likely related to CKD/ARF/acute illness -cardiology signed off -cycle troponin.  Echo from [place]: 2015: from [facility]  The left ventricle is normal in size. There is mild concentric left ventricular hypertrophy.The left ventricular ejection fraction is severely reduced (<25%). There is severe diffuse hypokinesis of the left ventricle. There is severe (Grade IV/IV) diastolic dysfunction; irreversible restrictive pattern of mitral inflow.  There is also a cardiac cath from 2014   Atrial fib - appears new Now in sinus. Will start warfarin and  Teaching. Discussed with son, [name]. Son and D.I.L. Will be able to assist with meds and f/u visits. Pt will be moving in with them this weekend.  BB adjusted by cardiology  Acute Renal Failure, prior history of CKD.  Resolving.  Nephrology has signed off  Abnormal renal US with left renal complex cyst v. Solid lesion, also bladder wall thickening.   Discussed with radiologist and viewed MRI of the abdomen in care everywhere from 2013. On previous study, multiple renal cysts noted. Given patient's renal failure and inability to have contrast for either CAT scan or MRI, either study may be low yield. Repeat US in 3 months.  Urology recommends outpt follow up. Back on plavix  Diabetes, uncontrolled diabetes with renal manifestations CBGs better.   hypomagnesium -repleted  Leukocytosis -resolved    Essential hypertension, benign: continue metoprolol.    Chronic systolic heart failure, compensated    CAD (coronary artery disease)   Other and unspecified hyperlipidemia    Microscopic hematuria  Transfer to telemetry. Increase activity. possiby home tomorrow if stable.  Code Status: full Family Communication: son Disposition Plan: home with son   Consultants:  nephro  Cards  urology  Procedures:    HPI/Subjective: No complaints.   Objective: Filed Vitals:   04/06/14 0457  BP: 133/92  Pulse: 74  Temp: 98 F (36.7 C)  Resp: 18    Intake/Output Summary (Last 24 hours) at 04/06/14 1354 Last data filed at 04/06/14 0719  Gross per 24 hour  Intake  970.5 ml  Output    900 ml  Net   70.5 ml   Filed Weights   04/04/14 0500 04/05/14 0439 04/06/14 0457  Weight: 95.4 kg (210 lb 5.1 oz) 96.6 kg (212 lb 15.4 oz) 96.3 kg (212 lb 4.9 oz)    telemetry: NSR, pSVT  Exam:   General:  A+Ox3, NAD  Cardiovascular: RRR  Respiratory: clear, no wheezing, rales or rhonchi  Abdomen: +BS, soft  Extremities: no edema   Neurologic: Nonfocal.  Data Reviewed: Basic Metabolic Panel:  Recent Labs Lab 04/02/14 0600 04/03/14 0234 04/03/14 1120 04/04/14 0002 04/04/14 1130 04/05/14 0850 04/06/14 0426  NA 133* 132* 135* 134* 138 139 137  K 3.8 3.4* 3.9 3.6* 3.8 3.8 4.1  CL 88* 88* 96 96 99 103 99  CO2 31 26 25 26 26 23 23   GLUCOSE 334* 464* 261* 243* 268* 97 132*  BUN 58* 65* 64* 55* 48* 36* 33*  CREATININE 4.05* 4.95* 4.85* 4.23* 3.60* 3.00* 2.93*  CALCIUM 9.6 8.7 8.5 8.4 9.1 9.2 9.0  MG  --  1.5  --   --   --   --   --  PHOS  --   --   --   --  3.0  --   --    Liver Function Tests:  Recent Labs Lab 04/01/14 2300 04/04/14 1130  AST 34  --   ALT 35  --   ALKPHOS 173*  --   BILITOT 1.8*  --   PROT 7.6  --   ALBUMIN 3.4* 2.5*   No results found for this basename: LIPASE, AMYLASE,  in the last 168 hours  Recent Labs Lab 04/02/14 0143  AMMONIA 28   CBC:  Recent Labs Lab 04/02/14 0600 04/03/14 0234 04/04/14 0002 04/05/14 0257 04/06/14 0426  WBC 9.1 8.5 7.3 7.5 7.5  HGB 13.7  13.6 12.1* 11.9* 11.1*  HCT 39.3 40.3 37.4* 35.9* 34.4*  MCV 88.5 89.6 91.7 92.8 93.2  PLT 157 166 159 160 163   Cardiac Enzymes:  Recent Labs Lab 04/02/14 0130 04/02/14 0600 04/02/14 1256  TROPONINI <0.30 <0.30 <0.30   BNP (last 3 results) No results found for this basename: PROBNP,  in the last 8760 hours CBG:  Recent Labs Lab 04/05/14 1157 04/05/14 1625 04/05/14 2145 04/06/14 0649 04/06/14 1146  GLUCAP 128* 116* 105* 89 149*    Recent Results (from the past 240 hour(s))  CULTURE, BLOOD (ROUTINE X 2)     Status: None   Collection Time    04/02/14 12:17 AM      Result Value Ref Range Status   Specimen Description BLOOD RIGHT ARM   Final   Special Requests     Final   Value: BOTTLES DRAWN AEROBIC AND ANAEROBIC 10CC Immunocompromised   Culture  Setup Time     Final   Value: 04/02/2014 13:44     Performed at [facility]   Culture     Final   Value:        BLOOD CULTURE RECEIVED NO GROWTH TO DATE CULTURE WILL BE HELD FOR 5 DAYS BEFORE ISSUING A FINAL NEGATIVE REPORT     Performed at [facility]   Report Status PENDING   Incomplete  CULTURE, BLOOD (ROUTINE X 2)     Status: None   Collection Time    04/02/14 12:31 AM      Result Value Ref Range Status   Specimen Description BLOOD LEFT SHOULDER   Final   Special Requests BOTTLES DRAWN AEROBIC ONLY 5CC Immunocompromised   Final   Culture  Setup Time     Final   Value: 04/02/2014 13:44     Performed at [facility]   Culture     Final   Value:        BLOOD CULTURE RECEIVED NO GROWTH TO DATE CULTURE WILL BE HELD FOR 5 DAYS BEFORE ISSUING A FINAL NEGATIVE REPORT     Performed at [facility]   Report Status PENDING   Incomplete  MRSA PCR SCREENING     Status: None   Collection Time    04/02/14  3:27 AM      Result Value Ref Range Status   MRSA by PCR NEGATIVE  NEGATIVE Final   Comment:            The GeneXpert MRSA Assay (FDA     approved for NASAL specimens     only), is one  component of a     comprehensive MRSA colonization     surveillance program. It is not     intended to diagnose MRSA     infection nor to  guide or     monitor treatment for     MRSA infections.     Studies: No results found.  Scheduled Meds: . atorvastatin  40 mg Oral Daily  . clopidogrel  75 mg Oral Q breakfast  . insulin aspart  0-15 Units Subcutaneous TID WC  . insulin aspart  0-5 Units Subcutaneous QHS  . insulin aspart  10 Units Subcutaneous TID WC  . insulin glargine  45 Units Subcutaneous QHS  . metoprolol tartrate  25 mg Oral BID  . sodium chloride  3 mL Intravenous Q12H  . warfarin  7.5 mg Oral ONCE-1800  . Warfarin - Pharmacist Dosing Inpatient   Does not apply q1800   Continuous Infusions: . sodium chloride 10 mL/hr at 04/04/14 1900  . heparin 1,250 Units/hr (04/06/14 1245)   Antibiotics Given (last 72 hours)   None     Time spent: 25 min  Delfina Redwood, MD [facility] Pager [phone]. If 7PM-7AM, please contact night-coverage at [email or web address], password [facility] 04/06/2014, 1:54 PM  LOS: 5 days

## 2014-04-06 NOTE — Care Management Note (Signed)
    Page 1 of 2   04/07/2014     3:27:41 PM CARE MANAGEMENT NOTE 04/07/2014  Patient:  Tony Freeman, Tony Freeman   Account Number:  [id number]  Date Initiated:  04/03/2014  Documentation initiated by:  Marvetta Gibbons  Subjective/Objective Assessment:   Pt admitted with sepsis and afib     Action/Plan:   PTA pt lived at home   Anticipated DC Date:  04/08/2014   Anticipated DC Plan:  [place]  CM consult      [place]   Choice offered to / List presented to:  C-1 Patient        [place] arranged  HH-1 RN      [place].   Status of service:  Completed, signed off Medicare Important Message given?  YES (If response is "NO", the following Medicare IM given date fields will be blank) Date Medicare IM given:  04/02/2014 Date Additional Medicare IM given:  04/06/2014  Discharge Disposition:  [place]  Per UR Regulation:  Reviewed for med. necessity/level of care/duration of stay  If discussed at [place] of Stay Meetings, dates discussed:   04/06/2014    Comments:  04/07/14 Ellan Lambert, RN, BSN [phone] Pt for dc on 6/13 home with son, Tony Freeman, who lives in [place].  Address is [address] [place], [place] [address].  Phone # [phone]. Spoke with son to arrange [facility] care; referral to [facility], per son's choice.  Start of care 24-48h post dc date.  PT/INR to be drawn 6/16 with results to pt's PCP, Dr Salome Holmes at [facility] in [place], [place].  04/06/14 Ellan Lambert, RN, BSN [phone] Pt would likely benefit from [facility] [place] for medication compliance, as states has trouble remembering to take meds. Pt states lives with daughter, who works.  04/03/14- 1330- Marvetta Gibbons RN, BSN [phone] Consulted for benefits check- COPAY WILL BE $45 FOR XARELTO,$45 FOR ORVIFBP,$79 FOR ELIQUIS-NO PRIOR AUTH REQUIRED- will follow for any further  assistance with drug that is chosen.

## 2014-04-06 NOTE — Progress Notes (Signed)
ANTICOAGULATION CONSULT NOTE - Follow Up Consult  Pharmacy Consult for Heparin  Indication: atrial fibrillation  No Known Allergies  Patient Measurements: Height: 5\' 6"  (167.6 cm) Weight: 212 lb 15.4 oz (96.6 kg) IBW/kg (Calculated) : 63.8  Vital Signs: Temp: 98.6 F (37 C) (06/10 2100) Temp src: Oral (06/10 2100) BP: 146/86 mmHg (06/10 2100) Pulse Rate: 66 (06/10 2100)  Labs:  Recent Labs  04/03/14 0234  04/04/14 0002 04/04/14 1130 04/05/14 0257 04/05/14 0850 04/05/14 1346 04/05/14 2310  HGB 13.6  --  12.1*  --  11.9*  --   --   --   HCT 40.3  --  37.4*  --  35.9*  --   --   --   PLT 166  --  159  --  160  --   --   --   LABPROT  --   --   --   --   --   --  11.9  --   INR  --   --   --   --   --   --  0.89  --   HEPARINUNFRC  --   < > 0.55  --  0.25*  --  0.28* 0.40  CREATININE 4.95*  < > 4.23* 3.60*  --  3.00*  --   --   < > = values in this interval not displayed.  Estimated Creatinine Clearance: 28.1 ml/min (by C-G formula based on Cr of 3).   Medications:  Heparin 1150 units/hr  Assessment: 63 y/o M on heparin for afib. HL is 0.4. Other labs as above.   Goal of Therapy:  Heparin level 0.3-0.7 units/ml Monitor platelets by anticoagulation protocol: Yes   Plan:  -Continue heparin at 1150 units/hr -0800 HL to confirm -Daily CBC/HL -Monitor for bleeding  Narda Bonds 04/06/2014,1:04 AM

## 2014-04-06 NOTE — Progress Notes (Signed)
ANTICOAGULATION CONSULT NOTE - Follow Up Consult  Pharmacy Consult for heparin and coumadin Indication: atrial fibrillation  No Known Allergies  Patient Measurements: Height: 5\' 6"  (167.6 cm) Weight: 212 lb 4.9 oz (96.3 kg) IBW/kg (Calculated) : 63.8 Heparin dosing weight: 85 kg   Vital Signs: Temp: 98 F (36.7 C) (06/11 0457) Temp src: Oral (06/11 0457) BP: 133/92 mmHg (06/11 0457) Pulse Rate: 74 (06/11 0457)  Labs:  Recent Labs  04/04/14 0002 04/04/14 1130 04/05/14 0257 04/05/14 0850 04/05/14 1346 04/05/14 2310 04/06/14 0426 04/06/14 0850  HGB 12.1*  --  11.9*  --   --   --  11.1*  --   HCT 37.4*  --  35.9*  --   --   --  34.4*  --   PLT 159  --  160  --   --   --  163  --   LABPROT  --   --   --   --  11.9  --  12.3  --   INR  --   --   --   --  0.89  --  0.93  --   HEPARINUNFRC 0.55  --  0.25*  --  0.28* 0.40  --  0.28*  CREATININE 4.23* 3.60*  --  3.00*  --   --  2.93*  --     Estimated Creatinine Clearance: 28.8 ml/min (by C-G formula based on Cr of 2.93).  Assessment: Patient is a 63 y.o M on anticoagulation for new onset afib.  Heparin level is slightly subtherapeutic at 0.28 and INR of 0.93 after one dose of coumadin 7.5mg  given last night.  Goal of Therapy:  INR 2-3 Heparin level 0.3-0.7 units/ml Monitor platelets by anticoagulation protocol: Yes   Plan:  1) increase heparin drip to 1250 units/hr 2) check 8 hour heparin level 3) coumadin 7.5mg  PO x1 today  Saylor Sheckler P 04/06/2014,10:49 AM

## 2014-04-07 LAB — CBC
HCT: 33.2 % — ABNORMAL LOW (ref 39.0–52.0)
Hemoglobin: 10.8 g/dL — ABNORMAL LOW (ref 13.0–17.0)
MCH: 30.4 pg (ref 26.0–34.0)
MCHC: 32.5 g/dL (ref 30.0–36.0)
MCV: 93.5 fL (ref 78.0–100.0)
Platelets: 191 10*3/uL (ref 150–400)
RBC: 3.55 MIL/uL — AB (ref 4.22–5.81)
RDW: 13.2 % (ref 11.5–15.5)
WBC: 6.7 10*3/uL (ref 4.0–10.5)

## 2014-04-07 LAB — GLUCOSE, CAPILLARY
GLUCOSE-CAPILLARY: 200 mg/dL — AB (ref 70–99)
Glucose-Capillary: 95 mg/dL (ref 70–99)
Glucose-Capillary: 174 mg/dL — ABNORMAL HIGH (ref 70–99)

## 2014-04-07 LAB — PROTIME-INR
INR: 1.17 (ref 0.00–1.49)
PROTHROMBIN TIME: 14.7 s (ref 11.6–15.2)

## 2014-04-07 LAB — HEPARIN LEVEL (UNFRACTIONATED): HEPARIN UNFRACTIONATED: 0.37 [IU]/mL (ref 0.30–0.70)

## 2014-04-07 MED ORDER — WARFARIN SODIUM 7.5 MG PO TABS
7.5000 mg | ORAL_TABLET | Freq: Once | ORAL | Status: AC
  Administered 2014-04-07: 7.5 mg via ORAL
  Filled 2014-04-07: qty 1

## 2014-04-07 MED ORDER — ENOXAPARIN SODIUM 100 MG/ML ~~LOC~~ SOLN
95.0000 mg | SUBCUTANEOUS | Status: DC
  Administered 2014-04-07: 95 mg via SUBCUTANEOUS
  Filled 2014-04-07 (×3): qty 1

## 2014-04-07 MED ORDER — FUROSEMIDE 40 MG PO TABS
40.0000 mg | ORAL_TABLET | Freq: Two times a day (BID) | ORAL | Status: DC
Start: 2014-04-07 — End: 2014-04-08
  Administered 2014-04-07 – 2014-04-08 (×2): 40 mg via ORAL
  Filled 2014-04-07 (×5): qty 1

## 2014-04-07 NOTE — Progress Notes (Signed)
Subjective: No SOB or CP  Objective: Vital signs in last 24 hours: Temp:  [97.7 F (36.5 C)-98 F (36.7 C)] 98 F (36.7 C) (06/12 0520) Pulse Rate:  [62-73] 62 (06/12 0520) Resp:  [18-20] 18 (06/12 0520) BP: (128-148)/(81-88) 132/85 mmHg (06/12 0520) SpO2:  [98 %-100 %] 100 % (06/12 0520) Last BM Date: 04/06/14  Intake/Output from previous day: 06/11 0701 - 06/12 0700 In: 720 [P.O.:720] Out: 1250 [Urine:1250] Intake/Output this shift:    Medications Current Facility-Administered Medications  Medication Dose Route Frequency Provider Last Rate Last Dose  . 0.9 %  sodium chloride infusion  250 mL Intravenous PRN Belkys A Regalado, MD      . 0.9 %  sodium chloride infusion   Intravenous Continuous Delfina Redwood, MD 10 mL/hr at 04/04/14 1900    . acetaminophen (TYLENOL) tablet 650 mg  650 mg Oral Q6H PRN Gardiner Barefoot, NP   650 mg at 04/05/14 0913  . atorvastatin (LIPITOR) tablet 40 mg  40 mg Oral Daily Jessica U Vann, DO   40 mg at 04/06/14 1140  . clopidogrel (PLAVIX) tablet 75 mg  75 mg Oral Q breakfast Delfina Redwood, MD   75 mg at 04/07/14 0626  . heparin ADULT infusion 100 units/mL (25000 units/250 mL)  1,250 Units/hr Intravenous Continuous Anh P Pham, RPH 12.5 mL/hr at 04/06/14 1245 1,250 Units/hr at 04/06/14 1245  . insulin aspart (novoLOG) injection 0-15 Units  0-15 Units Subcutaneous TID WC Samella Parr, NP   2 Units at 04/06/14 1735  . insulin aspart (novoLOG) injection 0-5 Units  0-5 Units Subcutaneous QHS Samella Parr, NP   4 Units at 04/03/14 2145  . insulin aspart (novoLOG) injection 10 Units  10 Units Subcutaneous TID WC Delfina Redwood, MD   10 Units at 04/06/14 1734  . insulin glargine (LANTUS) injection 45 Units  45 Units Subcutaneous QHS Delfina Redwood, MD   45 Units at 04/05/14 2326  . metoprolol tartrate (LOPRESSOR) tablet 25 mg  25 mg Oral BID Troy Sine, MD   25 mg at 04/07/14 0032  . ondansetron (ZOFRAN) tablet 4 mg  4 mg  Oral Q6H PRN Belkys A Regalado, MD       Or  . ondansetron (ZOFRAN) injection 4 mg  4 mg Intravenous Q6H PRN Belkys A Regalado, MD      . sodium chloride 0.9 % injection 3 mL  3 mL Intravenous Q12H Belkys A Regalado, MD   3 mL at 04/05/14 0910  . sodium chloride 0.9 % injection 3 mL  3 mL Intravenous PRN Elmarie Shiley, MD      . Warfarin - Pharmacist Dosing Inpatient   Does not apply q1800 Rande Lawman [place], [place]        PE: General appearance: alert, cooperative and no distress Lungs: clear to auscultation bilaterally Heart: regular rate and rhythm, S1, S2 normal, no murmur, click, rub or gallop Abdomen: nontender, +BS Extremities: No LEE Pulses: 2+ and symmetric Skin: Warm nd dry Neurologic: Grossly normal  Lab Results:   Recent Labs  04/05/14 0257 04/06/14 0426 04/07/14 0447  WBC 7.5 7.5 6.7  HGB 11.9* 11.1* 10.8*  HCT 35.9* 34.4* 33.2*  PLT 160 163 191   BMET  Recent Labs  04/04/14 1130 04/05/14 0850 04/06/14 0426  NA 138 139 137  K 3.8 3.8 4.1  CL 99 103 99  CO2 26 23 23   GLUCOSE 268* 97 132*  BUN 48*  36* 33*  CREATININE 3.60* 3.00* 2.93*  CALCIUM 9.1 9.2 9.0   PT/INR  Recent Labs  04/05/14 1346 04/06/14 0426 04/07/14 0447  LABPROT 11.9 12.3 14.7  INR 0.89 0.93 1.17    Assessment/Plan 1. New onset a-fib/flutter with RVR (per tele start shortly after midnight in the morning of 6/8, no prior knowledge of arrhythmia)  - maitaining NSR. HR 81 BPM.  He did have a very short run of SVT.  3 beat NSVT - CHA2DS2-Vasc score 5-6  - On coumadin.  INR 1.17  2. 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 CAD s/p multiple stents and follow by Dr. Celine Ahr at [facility] (total of 10 stents per son)  - PCI LAD/RCA in 2007  - PCI LAD/RCA in 2009 at [facility]  - Abnormal lexiscan stress test 06/2013 moderate reversible perfusion abnormality in anterior, septal and apical region  - PCI LAD 08/2013  4. History of combined systolic and diastolic dysfunction  -He appears euvolemic. No dyspnea.  He likely will need daily or PRN lasix once in his home environment.  Daily weights and sodium restriction are very important.   - Continue to monitor fluid status for now  -Echo at [facility] 02/09/2013 EF 35-40%, grade II diastolic dysfunction, 6-7+ mitral regurg, RVSP 42mmHg  - Echo at [facility] 11/17/2013 Ef <25%, severe hypokinesis of LV, moderately dilated LA, grade IV/IV diastolic dysfunction with irreversible restrictive pattern of mitral inflow, severe pulm HTN (RVSP >29mmHg), 1-2+ MR  - proBNP at [facility] 03/14/2014 >13000  - Echo at [facility] 04/02/14 EF 25-30%, moderate concentric LVH, diffuse hypokinesis, moderately dilated LA, no significant MR observed.   5. Acute on chronic renal insufficiency  - baseline 2.10 on 03/14/14 at [facility], - nephrology following  Cr 2.93 yesterday improved from 3.6 6. Mildly elevated troponin on admission  - 0.11.  All subsequent trop negative.   likely related to intracranial etiology and CKD  - no anginal symptom to suggest related to CAD  7. DM: Uncontrolled.  Management per IM.  8. Abnormal renal US with L renal complex cyst v solid lesion and bladder wall thickening  - Urology consulted and seen on 6/9 with plan for renal US an U/A as outpatient and if continued hematuria then cystoscopy   Pt currently on Plavix and coumadin with LAD PCI 11/14 and PAF. Maintaining NSR; getting coumadin, not therapeutic.   LOS: 6 days   Troy Sine, MD, [facility] 04/07/2014 9:01 AM

## 2014-04-07 NOTE — Progress Notes (Addendum)
ANTICOAGULATION CONSULT NOTE - Follow Up Consult  Pharmacy Consult for heparin and coumadin Indication: atrial fibrillation  No Known Allergies  Patient Measurements: Height: 5\' 6"  (167.6 cm) Weight: 212 lb 4.9 oz (96.3 kg) IBW/kg (Calculated) : 63.8 Heparin Dosing Weight: 85 kg  Vital Signs: Temp: 98 F (36.7 C) (06/12 0520) Temp src: Oral (06/12 0520) BP: 132/85 mmHg (06/12 0520) Pulse Rate: 62 (06/12 0520)  Labs:  Recent Labs  04/04/14 1130  04/05/14 0257 04/05/14 0850 04/05/14 1346  04/06/14 0426 04/06/14 0850 04/06/14 1950 04/07/14 0447  HGB  --   < > 11.9*  --   --   --  11.1*  --   --  10.8*  HCT  --   --  35.9*  --   --   --  34.4*  --   --  33.2*  PLT  --   --  160  --   --   --  163  --   --  191  LABPROT  --   --   --   --  11.9  --  12.3  --   --  14.7  INR  --   --   --   --  0.89  --  0.93  --   --  1.17  HEPARINUNFRC  --   < > 0.25*  --  0.28*  < >  --  0.28* 0.47 0.37  CREATININE 3.60*  --   --  3.00*  --   --  2.93*  --   --   --   < > = values in this interval not displayed.  Estimated Creatinine Clearance: 28.8 ml/min (by C-G formula based on Cr of 2.93).  Assessment: Patient is a 63 y.o M on anticoagulation for new onset Afib-- now in NSR.  Heparin level is at goal with 0.37 and INR trending up to 1.17.  No bleeding documented.  Goal of Therapy:  INR 2-3 Heparin level 0.3-0.7 units/ml Monitor platelets by anticoagulation protocol: Yes   Plan:  1) continue heparin drip at 1250 units/hr 2) repeat coumadin 7.5mg  PO x1 today-- will plan on increasing dose tomorrow if INR still slowly trending up   Shadrack Brummitt P 04/07/2014,8:54 AM  Adden: RN reported issues with IV line this morning.  To transition patient from heparin to lovenox.  Scr 2.93 (crcl~29).  Will start lovenox 95mg  SQ q24h.

## 2014-04-07 NOTE — Progress Notes (Signed)
Pharmacist Heart Failure Core Measure Documentation  Assessment: Tony Freeman has an EF documented as 25-30% on 04/02/14 by 2D echo.  Rationale: Heart failure patients with left ventricular systolic dysfunction (LVSD) and an EF < 40% should be prescribed an angiotensin converting enzyme inhibitor (ACEI) or angiotensin receptor blocker (ARB) at discharge unless a contraindication is documented in the medical record.  This patient is not currently on an ACEI or ARB for HF.  This note is being placed in the record in order to provide documentation that a contraindication to the use of these agents is present for this encounter.  ACE Inhibitor or Angiotensin Receptor Blocker is contraindicated (specify all that apply)  []   ACEI allergy AND ARB allergy []   Angioedema []   Moderate or severe aortic stenosis []   Hyperkalemia []   Hypotension []   Renal artery stenosis [x]   Worsening renal function, preexisting renal disease or dysfunction   Lynelle Doctor 04/07/2014 9:09 AM

## 2014-04-07 NOTE — Progress Notes (Addendum)
PROGRESS NOTE  Tony Freeman Orth ZHY:[id number] DOB: 1951/05/16 DOA: 04/01/2014 PCP: Tanya Nones, MD  Assessment/Plan: Encephalopathy; resolved, likely multifactorial, metabolic secondary to hyperglycemia, electrolytes abnormalities. MRI negative for acute infarct.    Lactic acidosis- resovled. Infectious workup negative.  Off antibiotics  Elevated troponin; no evidence for ACS. Likely related to CKD/ARF/acute illness -cardiology signed off -cycle troponin.  Echo from [place]: 2015: from [facility]  The left ventricle is normal in size. There is mild concentric left ventricular hypertrophy.The left ventricular ejection fraction is severely reduced (<25%). There is severe diffuse hypokinesis of the left ventricle. There is severe (Grade IV/IV) diastolic dysfunction; irreversible restrictive pattern of mitral inflow.  There is also a cardiac cath from 2014   Atrial fib - appears new chads 5. Now in sinus. Warfarin started. Change heparin gtt to lovenox. No bridge needed at discharge. Family requests discharge tomorrow if stable.  Home health arranged for INR check Tuesday and med compliance, DM, etc.  PCP is Dr. Royal Piedra. Cardiologist in [place]  Acute Renal Failure, prior history of CKD.  Resolving.  Nephrology has signed off  Abnormal renal US with left renal complex cyst v. Solid lesion, also bladder wall thickening.   Discussed with radiologist and viewed MRI of the abdomen in care everywhere from 2013. On previous study, multiple renal cysts noted. Given patient's renal failure and inability to have contrast for either CAT scan or MRI, either study may be low yield. Repeat US in 3 months.  Urology recommends outpt follow up. Back on plavix  Diabetes, uncontrolled diabetes with renal manifestations Lows last night. Family feels uncomfortable with discharge until CBGs ok for 24 hours. Will d/c SSI  hypomagnesium -repleted  Leukocytosis -resolved    Essential  hypertension, benign: continue metoprolol.    Chronic systolic heart failure: comfortable lying flat, but legs with increasing edema. Will resume lasix    CAD (coronary artery disease) with stents: plavix and warfarin at discharge    Other and unspecified hyperlipidemia    Microscopic hematuria   possiby home tomorrow if stable.  Code Status: full Family Communication: son Disposition Plan: home with son   Consultants:  nephro  Cards  urology  Procedures:    HPI/Subjective: No complaints.   Objective: Filed Vitals:   04/07/14 0520  BP: 132/85  Pulse: 62  Temp: 98 F (36.7 C)  Resp: 18    Intake/Output Summary (Last 24 hours) at 04/07/14 1439 Last data filed at 04/07/14 0521  Gross per 24 hour  Intake    240 ml  Output    900 ml  Net   -660 ml   Filed Weights   04/04/14 0500 04/05/14 0439 04/06/14 0457  Weight: 95.4 kg (210 lb 5.1 oz) 96.6 kg (212 lb 15.4 oz) 96.3 kg (212 lb 4.9 oz)    telemetry: NSR, pSVT  Exam:   General:  A+Ox3, NAD  Cardiovascular: RRR  Respiratory: clear, no wheezing, rales or rhonchi  Abdomen: +BS, soft  Extremities: 1+ edema   Neurologic: Nonfocal.  Data Reviewed: Basic Metabolic Panel:  Recent Labs Lab 04/02/14 0600 04/03/14 0234 04/03/14 1120 04/04/14 0002 04/04/14 1130 04/05/14 0850 04/06/14 0426  NA 133* 132* 135* 134* 138 139 137  K 3.8 3.4* 3.9 3.6* 3.8 3.8 4.1  CL 88* 88* 96 96 99 103 99  CO2 31 26 25 26 26 23 23   GLUCOSE 334* 464* 261* 243* 268* 97 132*  BUN 58* 65* 64* 55* 48* 36* 33*  CREATININE  4.05* 4.95* 4.85* 4.23* 3.60* 3.00* 2.93*  CALCIUM 9.6 8.7 8.5 8.4 9.1 9.2 9.0  MG  --  1.5  --   --   --   --   --   PHOS  --   --   --   --  3.0  --   --    Liver Function Tests:  Recent Labs Lab 04/01/14 2300 04/04/14 1130  AST 34  --   ALT 35  --   ALKPHOS 173*  --   BILITOT 1.8*  --   PROT 7.6  --   ALBUMIN 3.4* 2.5*   No results found for this basename: LIPASE, AMYLASE,  in the last  168 hours  Recent Labs Lab 04/02/14 0143  AMMONIA 28   CBC:  Recent Labs Lab 04/03/14 0234 04/04/14 0002 04/05/14 0257 04/06/14 0426 04/07/14 0447  WBC 8.5 7.3 7.5 7.5 6.7  HGB 13.6 12.1* 11.9* 11.1* 10.8*  HCT 40.3 37.4* 35.9* 34.4* 33.2*  MCV 89.6 91.7 92.8 93.2 93.5  PLT 166 159 160 163 191   Cardiac Enzymes:  Recent Labs Lab 04/02/14 0130 04/02/14 0600 04/02/14 1256  TROPONINI <0.30 <0.30 <0.30   BNP (last 3 results) No results found for this basename: PROBNP,  in the last 8760 hours CBG:  Recent Labs Lab 04/06/14 1634 04/06/14 2052 04/06/14 2149 04/07/14 0602 04/07/14 1145  GLUCAP 130* 63* 111* 95 174*    Recent Results (from the past 240 hour(s))  CULTURE, BLOOD (ROUTINE X 2)     Status: None   Collection Time    04/02/14 12:17 AM      Result Value Ref Range Status   Specimen Description BLOOD RIGHT ARM   Final   Special Requests     Final   Value: BOTTLES DRAWN AEROBIC AND ANAEROBIC 10CC Immunocompromised   Culture  Setup Time     Final   Value: 04/02/2014 13:44     Performed at [facility]   Culture     Final   Value:        BLOOD CULTURE RECEIVED NO GROWTH TO DATE CULTURE WILL BE HELD FOR 5 DAYS BEFORE ISSUING A FINAL NEGATIVE REPORT     Performed at [facility]   Report Status PENDING   Incomplete  CULTURE, BLOOD (ROUTINE X 2)     Status: None   Collection Time    04/02/14 12:31 AM      Result Value Ref Range Status   Specimen Description BLOOD LEFT SHOULDER   Final   Special Requests BOTTLES DRAWN AEROBIC ONLY 5CC Immunocompromised   Final   Culture  Setup Time     Final   Value: 04/02/2014 13:44     Performed at [facility]   Culture     Final   Value:        BLOOD CULTURE RECEIVED NO GROWTH TO DATE CULTURE WILL BE HELD FOR 5 DAYS BEFORE ISSUING A FINAL NEGATIVE REPORT     Performed at [facility]   Report Status PENDING   Incomplete  MRSA PCR SCREENING     Status: None   Collection Time     04/02/14  3:27 AM      Result Value Ref Range Status   MRSA by PCR NEGATIVE  NEGATIVE Final   Comment:            The GeneXpert MRSA Assay (FDA     approved for NASAL specimens  only), is one component of a     comprehensive MRSA colonization     surveillance program. It is not     intended to diagnose MRSA     infection nor to guide or     monitor treatment for     MRSA infections.     Studies: No results found.  Scheduled Meds: . atorvastatin  40 mg Oral Daily  . clopidogrel  75 mg Oral Q breakfast  . enoxaparin (LOVENOX) injection  95 mg Subcutaneous Q24H  . insulin aspart  10 Units Subcutaneous TID WC  . insulin glargine  45 Units Subcutaneous QHS  . metoprolol tartrate  25 mg Oral BID  . warfarin  7.5 mg Oral ONCE-1800  . Warfarin - Pharmacist Dosing Inpatient   Does not apply q1800   Continuous Infusions:   Antibiotics Given (last 72 hours)   None     Time spent: 25 min  Delfina Redwood, MD [facility] Pager [phone]. If 7PM-7AM, please contact night-coverage at [email or web address], password [facility] 04/07/2014, 2:39 PM  LOS: 6 days

## 2014-04-08 LAB — BASIC METABOLIC PANEL
BUN: 30 mg/dL — ABNORMAL HIGH (ref 6–23)
CHLORIDE: 101 meq/L (ref 96–112)
CO2: 25 meq/L (ref 19–32)
CREATININE: 2.73 mg/dL — AB (ref 0.50–1.35)
Calcium: 9.4 mg/dL (ref 8.4–10.5)
GFR calc Af Amer: 27 mL/min — ABNORMAL LOW (ref >90)
GFR calc non Af Amer: 24 mL/min — ABNORMAL LOW (ref >90)
Glucose, Bld: 136 mg/dL — ABNORMAL HIGH (ref 70–99)
Potassium: 4 mEq/L (ref 3.7–5.3)
Sodium: 139 mEq/L (ref 137–147)

## 2014-04-08 LAB — CULTURE, BLOOD (ROUTINE X 2)
Culture: NO GROWTH
Culture: NO GROWTH

## 2014-04-08 LAB — PROTIME-INR
INR: 1.45 (ref 0.00–1.49)
Prothrombin Time: 17.3 seconds — ABNORMAL HIGH (ref 11.6–15.2)

## 2014-04-08 LAB — GLUCOSE, CAPILLARY
GLUCOSE-CAPILLARY: 156 mg/dL — AB (ref 70–99)
Glucose-Capillary: 108 mg/dL — ABNORMAL HIGH (ref 70–99)
Glucose-Capillary: 113 mg/dL — ABNORMAL HIGH (ref 70–99)

## 2014-04-08 MED ORDER — ATORVASTATIN CALCIUM 40 MG PO TABS: 40.0000 mg | ORAL_TABLET | Freq: Every day | ORAL | Status: DC

## 2014-04-08 MED ORDER — WARFARIN SODIUM 10 MG PO TABS
10.0000 mg | ORAL_TABLET | Freq: Once | ORAL | Status: AC
  Administered 2014-04-08: 10 mg via ORAL
  Filled 2014-04-08: qty 1

## 2014-04-08 MED ORDER — METOPROLOL TARTRATE 25 MG PO TABS: 25.0000 mg | ORAL_TABLET | Freq: Two times a day (BID) | ORAL | Status: DC

## 2014-04-08 MED ORDER — CLOPIDOGREL BISULFATE 75 MG PO TABS: 75.0000 mg | ORAL_TABLET | Freq: Every day | ORAL | Status: DC

## 2014-04-08 MED ORDER — WARFARIN SODIUM 7.5 MG PO TABS: 7.5000 mg | ORAL_TABLET | Freq: Every day | ORAL | Status: DC

## 2014-04-08 MED ORDER — INSULIN GLARGINE 100 UNIT/ML ~~LOC~~ SOLN: 45.0000 [IU] | Freq: Every day | SUBCUTANEOUS | Status: DC

## 2014-04-08 MED ORDER — INSULIN ASPART 100 UNIT/ML ~~LOC~~ SOLN: 10.0000 [IU] | Freq: Three times a day (TID) | SUBCUTANEOUS | Status: DC

## 2014-04-08 NOTE — Progress Notes (Signed)
Pt discharged per MD order and protocol. Discharge instructions reviewed with patient and pt's son and all questions answered. Pt given all prescriptions and aware of follow up appointments.

## 2014-04-08 NOTE — Discharge Summary (Signed)
Physician Discharge Summary  Tony Freeman LKG:[id number] DOB: [birth year] DOA: 04/01/2014  PCP: Tanya Nones, MD  Admit date: 04/01/2014 Discharge date: 04/08/2014  Time spent: 35 minutes  Recommendations for Outpatient Follow-up:  PT/INR to be drawn 6/16 with results to pt's PCP, Dr Salome Holmes at [facility] in [place], [place]. Cbc, bmp 2 weeks Repeat kidney ultrasound in  3 months  Discharge Diagnoses:  Principal Problem:   Encephalopathy Active Problems:   Hyperglycemia   Elevated troponin   Renal failure   Chronic kidney disease (CKD), stage IV (severe)   DM (diabetes mellitus), type 2, uncontrolled, with renal complications   Essential hypertension, benign   Chronic combined systolic and diastolic CHF (congestive heart failure)   CAD (coronary artery disease)   Other and unspecified hyperlipidemia   Abnormal renal ultrasound with left renal lesion and bladder wall thickening   Microscopic hematuria   Abnormal brain CT/age indeterminate lacunar infarct   Atrial fibrillation with RVR   Discharge Condition:  improved  Diet recommendation: cardiac/diabetic  Filed Weights   04/05/14 0439 04/06/14 0457 04/08/14 0511  Weight: 96.6 kg (212 lb 15.4 oz) 96.3 kg (212 lb 4.9 oz) 97.7 kg (215 lb 6.2 oz)    History of present illness:  Tony Freeman is a 63 y.o. male with PMH significant for CKD, Heart failure, cardiomyopathy, he gets his care at [facility] who was brought to the ED by son due to AMS. Patient was visiting son today. Patient was notice to be very weak, change in mental status, not as conversant. Patient Blood sugar was too high to read.  Patient went to sleep, son relates patient wake up making a sound like if he was shocking.  Per son patient has been complaining of side , flank pain. Patient complaining of ankle pain and shoulder pain.   Hospital Course:  Encephalopathy; resolved, likely multifactorial, metabolic secondary  to hyperglycemia, electrolytes abnormalities. MRI negative for acute infarct.  Lactic acidosis- resovled. Infectious workup negative. Off antibiotics  Elevated troponin; no evidence for ACS. Likely related to CKD/ARF/acute illness  -cardiology signed off  -cycle troponin.  Echo from [place]: 2015: from [facility]  The left ventricle is normal in size. There is mild concentric left ventricular hypertrophy.The left ventricular ejection fraction is severely reduced (<25%). There is severe diffuse hypokinesis of the left ventricle. There is severe (Grade IV/IV) diastolic dysfunction; irreversible restrictive pattern of mitral inflow.  There is also a cardiac cath from 2014  Atrial fib - appears new  chads 5. Now in sinus. Warfarin started. No bridge needed at discharge. Home health arranged for INR check Tuesday and med compliance, DM, etc. PCP is Dr. Royal Piedra. Cardiologist in [place]  Acute Renal Failure, prior history of CKD.  Resolving. Nephrology has signed off  Abnormal renal US with left renal complex cyst v. Solid lesion, also bladder wall thickening. Discussed with radiologist and viewed MRI of the abdomen in care everywhere from 2013. On previous study, multiple renal cysts noted. Given patient's renal failure and inability to have contrast for either CAT scan or MRI, either study may be low yield. Repeat US in 3 months. Urology recommends outpt follow up. Back on plavix  Diabetes, uncontrolled diabetes with renal manifestations  hypomagnesium  -repleted  Leukocytosis  -resolved  Essential hypertension, benign: continue metoprolol.  Chronic systolic heart failure: comfortable lying flat, lasix  CAD (coronary artery disease) with stents: plavix and warfarin at discharge  Other and unspecified hyperlipidemia  Microscopic hematuria   Procedures:  Echo  Renal u/s  Consultations:  Cards  nephro  Discharge Exam: Filed Vitals:   04/08/14 0511  BP: 133/66  Pulse:  66  Temp: 98.2 F (36.8 C)  Resp: 16    General: A+Ox3, NAD Cardiovascular: rrr Respiratory: clear  Discharge Instructions You were cared for by a hospitalist during your hospital stay. If you have any questions about your discharge medications or the care you received while you were in the hospital after you are discharged, you can call the unit and asked to speak with the hospitalist on call if the hospitalist that took care of you is not available. Once you are discharged, your primary care physician will handle any further medical issues. Please note that NO REFILLS for any discharge medications will be authorized once you are discharged, as it is imperative that you return to your primary care physician (or establish a relationship with a primary care physician if you do not have one) for your aftercare needs so that they can reassess your need for medications and monitor your lab values.      Discharge Instructions   Diet - low sodium heart healthy    Complete by:  As directed      Diet Carb Modified    Complete by:  As directed      Discharge instructions    Complete by:  As directed   Home health INR check on Tuesday Will get 10 mg coumadin in hospital 6/13-- will need 7.5mg  starting 6/14 daily Monitor blood sugars and bring to PCP     Increase activity slowly    Complete by:  As directed             Medication List    STOP taking these medications       aspirin EC 81 MG tablet     insulin lispro 100 UNIT/ML injection  Commonly known as:  HUMALOG     isosorbide mononitrate 60 MG 24 hr tablet  Commonly known as:  IMDUR     metoprolol 200 MG 24 hr tablet  Commonly known as:  TOPROL-XL      TAKE these medications       atorvastatin 40 MG tablet  Commonly known as:  LIPITOR  Take 40 mg by mouth daily.     cholecalciferol 1000 UNITS tablet  Commonly known as:  VITAMIN D  Take 2,000 Units by mouth daily.     clopidogrel 75 MG tablet  Commonly known as:   PLAVIX  Take 75 mg by mouth daily with breakfast.     furosemide 40 MG tablet  Commonly known as:  LASIX  Take 40 mg by mouth 2 (two) times daily.     insulin aspart 100 UNIT/ML injection  Commonly known as:  novoLOG  Inject 10 Units into the skin 3 (three) times daily with meals.     insulin glargine 100 UNIT/ML injection  Commonly known as:  LANTUS  Inject 0.45 mLs (45 Units total) into the skin at bedtime.     metoprolol tartrate 25 MG tablet  Commonly known as:  LOPRESSOR  Take 1 tablet (25 mg total) by mouth 2 (two) times daily.     multivitamin with minerals Tabs tablet  Take 1 tablet by mouth daily.     warfarin 7.5 MG tablet  Commonly known as:  COUMADIN  Take 1 tablet (7.5 mg total) by mouth daily.       No Known Allergies  Follow-up Information   Follow up with Ardis Hughs, MD In 2 months.   Specialty:  Urology   Contact information:   [place] [address] [phone]        The results of significant diagnostics from this hospitalization (including imaging, microbiology, ancillary and laboratory) are listed below for reference.    Significant Diagnostic Studies: Ct Head Wo Contrast  04/02/2014   CLINICAL DATA:  Weakness and dizziness.  EXAM: CT HEAD WITHOUT CONTRAST  TECHNIQUE: Contiguous axial images were obtained from the base of the skull through the vertex without intravenous contrast.  COMPARISON:  None.  FINDINGS: The ventricles are in the midline and demonstrate normal configuration. No mass effect or shift. No extra-axial fluid collections are identified. Focal area of low attenuation in the left thymic area is likely an age indeterminate lacunar type infarct. No hemorrhage. No findings for hemispheric infarction. No mass lesions. The brainstem and cerebellum are grossly normal.  The bony structures are unremarkable. The paranasal sinuses and mastoid air cells are clear.  IMPRESSION: Age indeterminate left the laminae lacunar type  infarct.  No findings for hemispheric infarction or intracranial hemorrhage.   Electronically Signed   By: Kalman Jewels M.D.   On: 04/02/2014 00:13   Mr Brain Wo Contrast  04/04/2014   CLINICAL DATA:  63 year old male with diabetes, hyperlipidemia, history of strokes and congestive heart failure. Dementia. Altered mental status. Very weak.  EXAM: MRI HEAD WITHOUT CONTRAST  TECHNIQUE: Multiplanar, multiecho pulse sequences of the brain and surrounding structures were obtained without intravenous contrast.  COMPARISON:  04/01/2014 CT.  No comparison MR.  FINDINGS: No acute infarct.  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  Minimal small vessel disease type changes.  No intracranial mass lesion noted on this unenhanced exam.  Mild atrophy without hydrocephalus.  Exophthalmos.  Major intracranial vascular structures are patent with diminutive size right vertebral artery.  Cervical medullary junction, pituitary region and pineal region unremarkable.  Minimal mucosal thickening maxillary sinuses.  IMPRESSION: No acute infarct.  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  Minimal small vessel disease type changes.  Mild atrophy without hydrocephalus.  Exophthalmos.  Major intracranial vascular structures are patent with diminutive size right vertebral artery.   Electronically Signed   By: Chauncey Cruel M.D.   On: 04/04/2014 09:48   US Renal  04/04/2014   CLINICAL DATA:  Hematuria.  Renal failure.  Diabetes.  EXAM: RENAL/URINARY TRACT ULTRASOUND COMPLETE  COMPARISON:  None.  FINDINGS: Right Kidney:  Length: 10.5 cm. Right kidney is echogenic consistent with chronic medical renal disease. No hydronephrosis . 3.4 cm simple cyst right upper renal pole. 2.5 cm simple cyst right mid kidney.  Left Kidney:  Length: 10.7 cm. Left kidney is echogenic consistent with chronic medical renal disease. No hydronephrosis . There is a 2.2 x 1.8 x 1.7 cm complex cyst versus solid lesion  left upper renal pole. CT or MRI should be considered for further evaluation. Renal cell carcinoma cannot be excluded.  Bladder:  Bladder is nondistended. Mild bladder wall thickening is present. Active bladder pathology cannot be excluded .  IMPRESSION: 1. 2.2 x 1.8 x 1.7 cm complex cyst versus solid lesion left upper renal pole. CT or MRI should be considered for further evaluation. Renal cell carcinoma cannot be excluded. 2. Mild bladder wall thickening . Cystoscopy should be considered to exclude significant bladder wall pathology. 3. Echogenic kidneys suggesting chronic medical renal  disease. No hydronephrosis or bladder distention .   Electronically Signed   By: [place]   On: 04/04/2014 07:49   Dg Chest Port 1 View  04/03/2014   CLINICAL DATA:  Hypoxia.  EXAM: PORTABLE CHEST - 1 VIEW  COMPARISON:  Chest x-ray 04/01/2014.  FINDINGS: Mediastinum and hilar structures are normal. Stable cardiomegaly with normal pulmonary vascularity. No pleural effusion or pneumothorax. No acute osseous abnormality .  IMPRESSION: 1. Stable cardiomegaly. 2. No acute cardiopulmonary disease.   Electronically Signed   By: Marcello Moores  Register   On: 04/03/2014 07:50   Dg Chest Port 1 View  04/01/2014   CLINICAL DATA:  Altered mental status; history of coronary artery disease.  EXAM: PORTABLE CHEST - 1 VIEW  COMPARISON:  None.  FINDINGS: The cardiac silhouette is enlarged. A calcified vessel or stent is present over the left cardiac apex. The pulmonary vascularity is not engorged. The lungs are hypoinflated. There is no pleural effusion. There is degenerative change of the left glenohumeral joint. New  IMPRESSION: There is no evidence of aspiration. The cardiac silhouette is enlarged without evidence of pulmonary edema.   Electronically Signed   By: David  Martinique   On: 04/01/2014 23:46    Microbiology: Recent Results (from the past 240 hour(s))  CULTURE, BLOOD (ROUTINE X 2)     Status: None   Collection Time    04/02/14  12:17 AM      Result Value Ref Range Status   Specimen Description BLOOD RIGHT ARM   Final   Special Requests     Final   Value: BOTTLES DRAWN AEROBIC AND ANAEROBIC 10CC Immunocompromised   Culture  Setup Time     Final   Value: 04/02/2014 13:44     Performed at [facility]   Culture     Final   Value:        BLOOD CULTURE RECEIVED NO GROWTH TO DATE CULTURE WILL BE HELD FOR 5 DAYS BEFORE ISSUING A FINAL NEGATIVE REPORT     Performed at [facility]   Report Status PENDING   Incomplete  CULTURE, BLOOD (ROUTINE X 2)     Status: None   Collection Time    04/02/14 12:31 AM      Result Value Ref Range Status   Specimen Description BLOOD LEFT SHOULDER   Final   Special Requests BOTTLES DRAWN AEROBIC ONLY 5CC Immunocompromised   Final   Culture  Setup Time     Final   Value: 04/02/2014 13:44     Performed at [facility]   Culture     Final   Value:        BLOOD CULTURE RECEIVED NO GROWTH TO DATE CULTURE WILL BE HELD FOR 5 DAYS BEFORE ISSUING A FINAL NEGATIVE REPORT     Performed at [facility]   Report Status PENDING   Incomplete  MRSA PCR SCREENING     Status: None   Collection Time    04/02/14  3:27 AM      Result Value Ref Range Status   MRSA by PCR NEGATIVE  NEGATIVE Final   Comment:            The GeneXpert MRSA Assay (FDA     approved for NASAL specimens     only), is one component of a     comprehensive MRSA colonization     surveillance program. It is not     intended to diagnose MRSA  infection nor to guide or     monitor treatment for     MRSA infections.     Labs: Basic Metabolic Panel:  Recent Labs Lab 04/02/14 0600 04/03/14 0234  04/04/14 0002 04/04/14 1130 04/05/14 0850 04/06/14 0426 04/08/14 0415  NA 133* 132*  < > 134* 138 139 137 139  K 3.8 3.4*  < > 3.6* 3.8 3.8 4.1 4.0  CL 88* 88*  < > 96 99 103 99 101  CO2 31 26  < > 26 26 23 23 25   GLUCOSE 334* 464*  < > 243* 268* 97 132* 136*  BUN 58* 65*  < > 55* 48* 36*  33* 30*  CREATININE 4.05* 4.95*  < > 4.23* 3.60* 3.00* 2.93* 2.73*  CALCIUM 9.6 8.7  < > 8.4 9.1 9.2 9.0 9.4  MG  --  1.5  --   --   --   --   --   --   PHOS  --   --   --   --  3.0  --   --   --   < > = values in this interval not displayed. Liver Function Tests:  Recent Labs Lab 04/01/14 2300 04/04/14 1130  AST 34  --   ALT 35  --   ALKPHOS 173*  --   BILITOT 1.8*  --   PROT 7.6  --   ALBUMIN 3.4* 2.5*   No results found for this basename: LIPASE, AMYLASE,  in the last 168 hours  Recent Labs Lab 04/02/14 0143  AMMONIA 28   CBC:  Recent Labs Lab 04/03/14 0234 04/04/14 0002 04/05/14 0257 04/06/14 0426 04/07/14 0447  WBC 8.5 7.3 7.5 7.5 6.7  HGB 13.6 12.1* 11.9* 11.1* 10.8*  HCT 40.3 37.4* 35.9* 34.4* 33.2*  MCV 89.6 91.7 92.8 93.2 93.5  PLT 166 159 160 163 191   Cardiac Enzymes:  Recent Labs Lab 04/02/14 0130 04/02/14 0600 04/02/14 1256  TROPONINI <0.30 <0.30 <0.30   BNP: BNP (last 3 results) No results found for this basename: PROBNP,  in the last 8760 hours CBG:  Recent Labs Lab 04/07/14 0602 04/07/14 1145 04/07/14 1653 04/08/14 0247 04/08/14 0604  GLUCAP 95 174* 200* 156* 108*       Signed:  Yolinda Duerr  [facility] 04/08/2014, 9:00 AM

## 2014-06-02 ENCOUNTER — Encounter (INDEPENDENT_AMBULATORY_CARE_PROVIDER_SITE_OTHER): Payer: Medicare PPO | Admitting: Ophthalmology

## 2014-06-02 DIAGNOSIS — H2510 Age-related nuclear cataract, unspecified eye: Secondary | ICD-10-CM

## 2014-06-02 DIAGNOSIS — H43819 Vitreous degeneration, unspecified eye: Secondary | ICD-10-CM

## 2014-06-02 DIAGNOSIS — E11359 Type 2 diabetes mellitus with proliferative diabetic retinopathy without macular edema: Secondary | ICD-10-CM

## 2014-06-02 DIAGNOSIS — H4310 Vitreous hemorrhage, unspecified eye: Secondary | ICD-10-CM

## 2014-06-02 DIAGNOSIS — I10 Essential (primary) hypertension: Secondary | ICD-10-CM

## 2014-06-02 DIAGNOSIS — E1165 Type 2 diabetes mellitus with hyperglycemia: Secondary | ICD-10-CM

## 2014-06-02 DIAGNOSIS — H35039 Hypertensive retinopathy, unspecified eye: Secondary | ICD-10-CM

## 2014-06-02 DIAGNOSIS — E1139 Type 2 diabetes mellitus with other diabetic ophthalmic complication: Secondary | ICD-10-CM

## 2014-06-09 ENCOUNTER — Encounter (INDEPENDENT_AMBULATORY_CARE_PROVIDER_SITE_OTHER): Payer: Medicare PPO | Admitting: Ophthalmology

## 2014-06-09 DIAGNOSIS — E1139 Type 2 diabetes mellitus with other diabetic ophthalmic complication: Secondary | ICD-10-CM

## 2014-06-09 DIAGNOSIS — E11359 Type 2 diabetes mellitus with proliferative diabetic retinopathy without macular edema: Secondary | ICD-10-CM

## 2014-06-09 DIAGNOSIS — E1165 Type 2 diabetes mellitus with hyperglycemia: Secondary | ICD-10-CM

## 2014-06-27 DIAGNOSIS — I4891 Unspecified atrial fibrillation: Secondary | ICD-10-CM

## 2014-06-27 DIAGNOSIS — I5022 Chronic systolic (congestive) heart failure: Secondary | ICD-10-CM

## 2014-06-27 DIAGNOSIS — E1129 Type 2 diabetes mellitus with other diabetic kidney complication: Secondary | ICD-10-CM

## 2014-06-27 DIAGNOSIS — E1165 Type 2 diabetes mellitus with hyperglycemia: Secondary | ICD-10-CM

## 2014-06-27 DIAGNOSIS — N189 Chronic kidney disease, unspecified: Secondary | ICD-10-CM

## 2014-06-27 HISTORY — PX: CARDIAC DEFIBRILLATOR PLACEMENT: SHX171

## 2014-07-16 ENCOUNTER — Encounter (HOSPITAL_COMMUNITY): Payer: Self-pay | Admitting: Emergency Medicine

## 2014-07-16 ENCOUNTER — Inpatient Hospital Stay (HOSPITAL_COMMUNITY)
Admission: EM | Admit: 2014-07-16 | Discharge: 2014-07-31 | DRG: 287 | Disposition: A | Discharge status: Home Health | Payer: Medicare HMO | Attending: Oncology | Admitting: Oncology

## 2014-07-16 ENCOUNTER — Emergency Department (HOSPITAL_COMMUNITY): Payer: Medicare HMO

## 2014-07-16 DIAGNOSIS — M109 Gout, unspecified: Secondary | ICD-10-CM | POA: Diagnosis present

## 2014-07-16 DIAGNOSIS — E785 Hyperlipidemia, unspecified: Secondary | ICD-10-CM | POA: Diagnosis present

## 2014-07-16 DIAGNOSIS — G4700 Insomnia, unspecified: Secondary | ICD-10-CM | POA: Diagnosis not present

## 2014-07-16 DIAGNOSIS — J45909 Unspecified asthma, uncomplicated: Secondary | ICD-10-CM | POA: Diagnosis present

## 2014-07-16 DIAGNOSIS — I2510 Atherosclerotic heart disease of native coronary artery without angina pectoris: Secondary | ICD-10-CM | POA: Diagnosis present

## 2014-07-16 DIAGNOSIS — N179 Acute kidney failure, unspecified: Secondary | ICD-10-CM

## 2014-07-16 DIAGNOSIS — D638 Anemia in other chronic diseases classified elsewhere: Secondary | ICD-10-CM | POA: Diagnosis present

## 2014-07-16 DIAGNOSIS — E1165 Type 2 diabetes mellitus with hyperglycemia: Secondary | ICD-10-CM | POA: Diagnosis present

## 2014-07-16 DIAGNOSIS — I480 Paroxysmal atrial fibrillation: Secondary | ICD-10-CM | POA: Diagnosis present

## 2014-07-16 DIAGNOSIS — I482 Chronic atrial fibrillation, unspecified: Secondary | ICD-10-CM

## 2014-07-16 DIAGNOSIS — Z981 Arthrodesis status: Secondary | ICD-10-CM | POA: Diagnosis not present

## 2014-07-16 DIAGNOSIS — Z95810 Presence of automatic (implantable) cardiac defibrillator: Secondary | ICD-10-CM | POA: Diagnosis not present

## 2014-07-16 DIAGNOSIS — Z955 Presence of coronary angioplasty implant and graft: Secondary | ICD-10-CM

## 2014-07-16 DIAGNOSIS — Z7901 Long term (current) use of anticoagulants: Secondary | ICD-10-CM | POA: Diagnosis not present

## 2014-07-16 DIAGNOSIS — I4891 Unspecified atrial fibrillation: Secondary | ICD-10-CM

## 2014-07-16 DIAGNOSIS — E162 Hypoglycemia, unspecified: Secondary | ICD-10-CM

## 2014-07-16 DIAGNOSIS — I10 Essential (primary) hypertension: Secondary | ICD-10-CM

## 2014-07-16 DIAGNOSIS — E1129 Type 2 diabetes mellitus with other diabetic kidney complication: Secondary | ICD-10-CM | POA: Diagnosis present

## 2014-07-16 DIAGNOSIS — E1121 Type 2 diabetes mellitus with diabetic nephropathy: Secondary | ICD-10-CM | POA: Diagnosis present

## 2014-07-16 DIAGNOSIS — Z8249 Family history of ischemic heart disease and other diseases of the circulatory system: Secondary | ICD-10-CM

## 2014-07-16 DIAGNOSIS — E11649 Type 2 diabetes mellitus with hypoglycemia without coma: Secondary | ICD-10-CM | POA: Diagnosis present

## 2014-07-16 DIAGNOSIS — E876 Hypokalemia: Secondary | ICD-10-CM | POA: Diagnosis not present

## 2014-07-16 DIAGNOSIS — F0390 Unspecified dementia without behavioral disturbance: Secondary | ICD-10-CM | POA: Diagnosis present

## 2014-07-16 DIAGNOSIS — I5043 Acute on chronic combined systolic (congestive) and diastolic (congestive) heart failure: Secondary | ICD-10-CM | POA: Diagnosis present

## 2014-07-16 DIAGNOSIS — Z8673 Personal history of transient ischemic attack (TIA), and cerebral infarction without residual deficits: Secondary | ICD-10-CM | POA: Diagnosis not present

## 2014-07-16 DIAGNOSIS — N184 Chronic kidney disease, stage 4 (severe): Secondary | ICD-10-CM

## 2014-07-16 DIAGNOSIS — R55 Syncope and collapse: Secondary | ICD-10-CM | POA: Diagnosis not present

## 2014-07-16 DIAGNOSIS — Z7952 Long term (current) use of systemic steroids: Secondary | ICD-10-CM | POA: Diagnosis not present

## 2014-07-16 DIAGNOSIS — Z794 Long term (current) use of insulin: Secondary | ICD-10-CM

## 2014-07-16 DIAGNOSIS — R600 Localized edema: Secondary | ICD-10-CM

## 2014-07-16 DIAGNOSIS — E1169 Type 2 diabetes mellitus with other specified complication: Secondary | ICD-10-CM

## 2014-07-16 DIAGNOSIS — I509 Heart failure, unspecified: Secondary | ICD-10-CM

## 2014-07-16 DIAGNOSIS — R93429 Abnormal radiologic findings on diagnostic imaging of unspecified kidney: Secondary | ICD-10-CM

## 2014-07-16 DIAGNOSIS — Z7982 Long term (current) use of aspirin: Secondary | ICD-10-CM | POA: Diagnosis not present

## 2014-07-16 DIAGNOSIS — I255 Ischemic cardiomyopathy: Secondary | ICD-10-CM | POA: Diagnosis present

## 2014-07-16 DIAGNOSIS — I129 Hypertensive chronic kidney disease with stage 1 through stage 4 chronic kidney disease, or unspecified chronic kidney disease: Secondary | ICD-10-CM | POA: Diagnosis present

## 2014-07-16 DIAGNOSIS — I4819 Other persistent atrial fibrillation: Secondary | ICD-10-CM

## 2014-07-16 DIAGNOSIS — IMO0002 Reserved for concepts with insufficient information to code with codable children: Secondary | ICD-10-CM

## 2014-07-16 HISTORY — DX: Chronic kidney disease, stage 4 (severe): N18.4

## 2014-07-16 HISTORY — DX: Ischemic cardiomyopathy: I25.5

## 2014-07-16 HISTORY — DX: Abnormal radiologic findings on diagnostic imaging of unspecified kidney: R93.429

## 2014-07-16 HISTORY — DX: Unspecified atrial flutter: I48.92

## 2014-07-16 HISTORY — DX: Type 2 diabetes mellitus with diabetic nephropathy: E11.21

## 2014-07-16 HISTORY — DX: Essential (primary) hypertension: I10

## 2014-07-16 HISTORY — DX: Paroxysmal atrial fibrillation: I48.0

## 2014-07-16 HISTORY — DX: Unspecified background retinopathy: H35.00

## 2014-07-16 LAB — BASIC METABOLIC PANEL
Anion gap: 13 (ref 5–15)
BUN: 52 mg/dL — ABNORMAL HIGH (ref 6–23)
CO2: 28 mEq/L (ref 19–32)
Calcium: 9.2 mg/dL (ref 8.4–10.5)
Chloride: 101 mEq/L (ref 96–112)
Creatinine, Ser: 2.84 mg/dL — ABNORMAL HIGH (ref 0.50–1.35)
GFR, EST AFRICAN AMERICAN: 26 mL/min — AB (ref >90)
GFR, EST NON AFRICAN AMERICAN: 22 mL/min — AB (ref >90)
Glucose, Bld: 36 mg/dL — CL (ref 70–99)
Potassium: 3.8 mEq/L (ref 3.7–5.3)
SODIUM: 142 meq/L (ref 137–147)

## 2014-07-16 LAB — URINE MICROSCOPIC-ADD ON

## 2014-07-16 LAB — URINALYSIS, ROUTINE W REFLEX MICROSCOPIC
BILIRUBIN URINE: NEGATIVE
Glucose, UA: NEGATIVE mg/dL
HGB URINE DIPSTICK: NEGATIVE
Ketones, ur: NEGATIVE mg/dL
Leukocytes, UA: NEGATIVE
NITRITE: NEGATIVE
PH: 6 (ref 5.0–8.0)
Protein, ur: 100 mg/dL — AB
Specific Gravity, Urine: 1.012 (ref 1.005–1.030)
UROBILINOGEN UA: 1 mg/dL (ref 0.0–1.0)

## 2014-07-16 LAB — CREATININE, URINE, RANDOM: Creatinine, Urine: 73.8 mg/dL

## 2014-07-16 LAB — CBC
HCT: 28.4 % — ABNORMAL LOW (ref 39.0–52.0)
Hemoglobin: 9.1 g/dL — ABNORMAL LOW (ref 13.0–17.0)
MCH: 29.4 pg (ref 26.0–34.0)
MCHC: 32 g/dL (ref 30.0–36.0)
MCV: 91.6 fL (ref 78.0–100.0)
PLATELETS: 194 10*3/uL (ref 150–400)
RBC: 3.1 MIL/uL — AB (ref 4.22–5.81)
RDW: 14.5 % (ref 11.5–15.5)
WBC: 8.8 10*3/uL (ref 4.0–10.5)

## 2014-07-16 LAB — TROPONIN I

## 2014-07-16 LAB — I-STAT TROPONIN, ED: TROPONIN I, POC: 0.06 ng/mL (ref 0.00–0.08)

## 2014-07-16 LAB — CBG MONITORING, ED
GLUCOSE-CAPILLARY: 39 mg/dL — AB (ref 70–99)
GLUCOSE-CAPILLARY: 113 mg/dL — AB (ref 70–99)
Glucose-Capillary: 77 mg/dL (ref 70–99)
Glucose-Capillary: 86 mg/dL (ref 70–99)

## 2014-07-16 LAB — GLUCOSE, CAPILLARY: GLUCOSE-CAPILLARY: 168 mg/dL — AB (ref 70–99)

## 2014-07-16 LAB — PRO B NATRIURETIC PEPTIDE: Pro B Natriuretic peptide (BNP): 12348 pg/mL — ABNORMAL HIGH (ref 0–125)

## 2014-07-16 MED ORDER — EDOXABAN TOSYLATE 30 MG PO TABS
30.0000 mg | ORAL_TABLET | Freq: Every day | ORAL | Status: DC
  Administered 2014-07-17 – 2014-07-31 (×15): 30 mg via ORAL
  Filled 2014-07-16 (×15): qty 1

## 2014-07-16 MED ORDER — HYDRALAZINE HCL 50 MG PO TABS
50.0000 mg | ORAL_TABLET | Freq: Three times a day (TID) | ORAL | Status: DC
  Administered 2014-07-16 – 2014-07-26 (×29): 50 mg via ORAL
  Filled 2014-07-16 (×33): qty 1

## 2014-07-16 MED ORDER — INSULIN ASPART 100 UNIT/ML ~~LOC~~ SOLN
0.0000 [IU] | Freq: Three times a day (TID) | SUBCUTANEOUS | Status: DC
  Administered 2014-07-17: 3 [IU] via SUBCUTANEOUS
  Administered 2014-07-17 – 2014-07-18 (×3): 2 [IU] via SUBCUTANEOUS
  Administered 2014-07-19 (×2): 3 [IU] via SUBCUTANEOUS
  Administered 2014-07-19: 2 [IU] via SUBCUTANEOUS
  Administered 2014-07-20: 9 [IU] via SUBCUTANEOUS

## 2014-07-16 MED ORDER — ATORVASTATIN CALCIUM 40 MG PO TABS
40.0000 mg | ORAL_TABLET | Freq: Every day | ORAL | Status: DC
  Administered 2014-07-17 – 2014-07-31 (×15): 40 mg via ORAL
  Filled 2014-07-16 (×15): qty 1

## 2014-07-16 MED ORDER — ADULT MULTIVITAMIN W/MINERALS CH
1.0000 | ORAL_TABLET | Freq: Every day | ORAL | Status: DC
  Administered 2014-07-17 – 2014-07-31 (×15): 1 via ORAL
  Filled 2014-07-16 (×15): qty 1

## 2014-07-16 MED ORDER — DEXTROSE 50 % IV SOLN
1.0000 | Freq: Once | INTRAVENOUS | Status: AC
  Administered 2014-07-16: 50 mL via INTRAVENOUS

## 2014-07-16 MED ORDER — ASPIRIN 81 MG PO CHEW
81.0000 mg | CHEWABLE_TABLET | Freq: Every day | ORAL | Status: DC
  Administered 2014-07-17 – 2014-07-20 (×4): 81 mg via ORAL
  Filled 2014-07-16 (×4): qty 1

## 2014-07-16 MED ORDER — CARVEDILOL 25 MG PO TABS
25.0000 mg | ORAL_TABLET | Freq: Two times a day (BID) | ORAL | Status: DC
  Administered 2014-07-16 – 2014-07-17 (×3): 25 mg via ORAL
  Filled 2014-07-16 (×4): qty 1

## 2014-07-16 MED ORDER — VITAMIN D3 25 MCG (1000 UNIT) PO TABS
2000.0000 [IU] | ORAL_TABLET | Freq: Every day | ORAL | Status: DC
  Administered 2014-07-17 – 2014-07-31 (×15): 2000 [IU] via ORAL
  Filled 2014-07-16 (×15): qty 2

## 2014-07-16 MED ORDER — SODIUM CHLORIDE 0.9 % IJ SOLN
3.0000 mL | Freq: Two times a day (BID) | INTRAMUSCULAR | Status: DC
  Administered 2014-07-16 – 2014-07-31 (×12): 3 mL via INTRAVENOUS

## 2014-07-16 MED ORDER — DEXTROSE 50 % IV SOLN
INTRAVENOUS | Status: AC
  Administered 2014-07-16: 50 mL via INTRAVENOUS
  Filled 2014-07-16: qty 50

## 2014-07-16 MED ORDER — ALBUTEROL SULFATE (2.5 MG/3ML) 0.083% IN NEBU: 2.5000 mg | INHALATION_SOLUTION | Freq: Four times a day (QID) | RESPIRATORY_TRACT | Status: DC | PRN

## 2014-07-16 MED ORDER — MOMETASONE FURO-FORMOTEROL FUM 200-5 MCG/ACT IN AERO
2.0000 | INHALATION_SPRAY | Freq: Two times a day (BID) | RESPIRATORY_TRACT | Status: DC
  Administered 2014-07-16 – 2014-07-31 (×28): 2 via RESPIRATORY_TRACT
  Filled 2014-07-16 (×2): qty 8.8

## 2014-07-16 MED ORDER — DEXTROSE 5 % IV SOLN
120.0000 mg | Freq: Once | INTRAVENOUS | Status: AC
  Administered 2014-07-16: 120 mg via INTRAVENOUS
  Filled 2014-07-16: qty 12

## 2014-07-16 MED ORDER — NITROGLYCERIN 2 % TD OINT: 1.0000 [in_us] | TOPICAL_OINTMENT | Freq: Once | TRANSDERMAL | Status: DC

## 2014-07-16 MED ORDER — ISOSORBIDE MONONITRATE ER 60 MG PO TB24
60.0000 mg | ORAL_TABLET | Freq: Every day | ORAL | Status: DC
  Administered 2014-07-17 – 2014-07-31 (×15): 60 mg via ORAL
  Filled 2014-07-16 (×15): qty 1

## 2014-07-16 NOTE — ED Notes (Signed)
CBG 77.

## 2014-07-16 NOTE — ED Notes (Signed)
Pt reports swelling to bilateral feet x 1 week, had pacemaker placed two weeks ago and is now also having sob. ekg done at triage, spo2 96%.

## 2014-07-16 NOTE — H&P (Signed)
Date: 07/16/2014               Patient Name:  Tony Freeman MRN: [id number]  DOB: [birth year] Age / Sex: 63 y.o., male   PCP: Tanya Nones, MD         Medical Service: Internal Medicine Teaching Service         Attending Physician: Dr. Gilles Chiquito, MD    First Contact: Dr. Randell Patient Pager: [phone]  Second Contact: Dr. Aundra Dubin Pager: [phone]       After Hours (After 5p/  First Contact Pager: [phone]  weekends / holidays): Second Contact Pager: [phone]   Chief Complaint: Leg swelling and shortness of breath  History of Present Illness: Tony Freeman is a 63 year old man with history of atrial fibrillation on edoxaban, CAD s/p PCI LAD/RCA 2007, LAD/RCA 2009, LAD 4315, systolic and diastolic CHF (echo 4/0/0867 with LV EF: 25-30%, diffuse hypokinesis), ischemic cardiomyopathy s/p dual chamber ICD placement 06/29/2014, DM2, CKD, HLD presenting with LE edema and SOB x 1 week. It had been worsening. He reports his weight increased from 218 to 230 (12 lbs). He was seen by his PCP 3 days ago who increased his Lasix from BID to TID. Since increasing his Lasix, he felt that his LE edema has continued to worsen and he has gained an additional 2 lbs. He reports dry cough, increased orthopnea (2 to 3 pillow), worsened exertional dyspnea, PND. Denies fevers, chills, chest pain, nausea, vomiting, headache, abdominal pain, diarrhea, dysuria, hematuria, paresthesias, myalgias, arthralgias.  Meds: Current Facility-Administered Medications  Medication Dose Route Frequency Provider Last Rate Last Dose  . furosemide (LASIX) 120 mg in dextrose 5 % 50 mL IVPB  120 mg Intravenous Once Harvie Heck, PA-C      . nitroGLYCERIN (NITROGLYN) 2 % ointment 1 inch  1 inch Topical Once Harvie Heck, PA-C       Current Outpatient Prescriptions  Medication Sig Dispense Refill  . albuterol (PROVENTIL HFA;VENTOLIN HFA) 108 (90 BASE) MCG/ACT inhaler Inhale 2 puffs into the lungs every 6 (six) hours as needed for  wheezing or shortness of breath.      Marland Kitchen aspirin 81 MG chewable tablet Chew 81 mg by mouth daily.      Marland Kitchen atorvastatin (LIPITOR) 40 MG tablet Take 1 tablet (40 mg total) by mouth daily.  30 tablet  0  . carvedilol (COREG) 25 MG tablet Take 25 mg by mouth 2 (two) times daily.      . cholecalciferol (VITAMIN D) 1000 UNITS tablet Take 2,000 Units by mouth daily.      Marland Kitchen edoxaban (SAVAYSA) 30 MG TABS tablet Take 30 mg by mouth daily.      . furosemide (LASIX) 40 MG tablet Take 40 mg by mouth 2 (two) times daily.      . hydrALAZINE (APRESOLINE) 50 MG tablet Take 50 mg by mouth 3 (three) times daily.      . insulin aspart (NOVOLOG) 100 UNIT/ML injection Inject 10 Units into the skin 3 (three) times daily with meals.  10 mL  11  . insulin glargine (LANTUS) 100 UNIT/ML injection Inject 0.45 mLs (45 Units total) into the skin at bedtime.  10 mL  11  . isosorbide mononitrate (IMDUR) 60 MG 24 hr tablet Take 60 mg by mouth daily.      . mometasone-formoterol (DULERA) 200-5 MCG/ACT AERO Inhale 2 puffs into the lungs 2 (two) times daily.      . Multiple Vitamin (MULTIVITAMIN WITH MINERALS) TABS tablet Take  1 tablet by mouth daily.        Allergies: Allergies as of 07/16/2014  . (No Known Allergies)   Past Medical History  Diagnosis Date  . Diabetes mellitus without complication   . Systolic and diastolic CHF, chronic     a. Echo at [facility] 11/17/2013 Ef <25%, severe hypokinesis of LV, moderately dilated LA, grade IV/IV diastolic dysfunction with irreversible restrictive pattern of mitral inflow, severe pulm HTN (RVSP >68mmHg), 1-2+ MR  b. Echo at [facility] 04/02/14 EF 25-30%, moderate concentric LVH, diffuse hypokinesis, moderately dilated LA, no significant MR observed  . Kidney failure   . CAD (coronary artery disease)     a. PCI LAD/RCA in 2007  b. PCI LAD/RCA in 2009 at [facility]  c. PCI LAD 08/2013   . Hyperlipidemia   . CVA (cerebral infarction)   . Stroke   . Dementia    Past Surgical History    Procedure Laterality Date  . Coronary stent placement    . Cardiac defibrillator placement     Family History  Problem Relation Age of Onset  . Heart disease Mother    History   Social History  . Marital Status: Unknown    Spouse Name: N/A    Number of Children: N/A  . Years of Education: N/A   Occupational History  . Not on file.   Social History Main Topics  . Smoking status: Never Smoker   . Smokeless tobacco: Not on file  . Alcohol Use: Not on file  . Drug Use: No  . Sexual Activity: Not on file   Other Topics Concern  . Not on file   Social History Narrative   Going to be living with his son.  He lives with his daughter currently.  2 children.  Disability since CVA    Review of Systems: Constitutional: no fevers/chills Eyes: no vision changes Ears, nose, mouth, throat, and face: +dry cough Respiratory: +some shortness of breath Cardiovascular: no chest pain Gastrointestinal: no nausea/vomiting, no abdominal pain, no constipation, no diarrhea Genitourinary: no dysuria, no hematuria Integument: no rash Hematologic/lymphatic: no bleeding/bruising, +edema Musculoskeletal: no arthralgias, no myalgias Neurological: no paresthesias, no weakness  Physical Exam: Blood pressure 136/77, pulse 78, temperature 98.3 F (36.8 C), temperature source Oral, resp. rate 17, height 5\' 6"  (1.676 m), SpO2 98.00%. General Apperance: NAD Head: Normocephalic, atraumatic Eyes: PERRL, EOMI, anicteric sclera Ears: Normal external ear canal Nose: Nares normal, septum midline, mucosa normal Throat: Lips, mucosa and tongue normal  Neck: Supple, trachea midline Back: No tenderness or bony abnormality  Lungs: Clear to auscultation bilaterally. + expiratory wheezes, No rhonchi or rales. Breathing comfortably Chest Wall: Nontender, no deformity Heart: Irregularly irregular, no murmur/rub/gallop Abdomen: Soft, nontender, nondistended, no rebound/guarding Extremities: Normal,  atraumatic, warm and well perfused, 2+ pitting edema to BLE to just below knees Pulses: 2+ throughout Skin: No rashes or lesions Neurologic: Alert and oriented x 3. CNII-XII intact. Normal strength and sensation   Lab results: Basic Metabolic Panel:  Recent Labs  07/16/14 1401  NA 142  K 3.8  CL 101  CO2 28  GLUCOSE 36*  BUN 52*  CREATININE 2.84*  CALCIUM 9.2   CBC:  Recent Labs  07/16/14 1401  WBC 8.8  HGB 9.1*  HCT 28.4*  MCV 91.6  PLT 194   Cardiac Enzymes: No results found for this basename: CKTOTAL, CKMB, CKMBINDEX, TROPONINI,  in the last 72 hours  BNP:  Recent Labs  07/16/14 1401  PROBNP 12348.0*  CBG:  Recent Labs  07/16/14 1511 07/16/14 1548 07/16/14 1728  GLUCAP 39* 113* 77   Urinalysis:  Recent Labs  07/16/14 1600  COLORURINE YELLOW  LABSPEC 1.012  PHURINE 6.0  GLUCOSEU NEGATIVE  HGBUR NEGATIVE  BILIRUBINUR NEGATIVE  KETONESUR NEGATIVE  PROTEINUR 100*  UROBILINOGEN 1.0  NITRITE NEGATIVE  LEUKOCYTESUR NEGATIVE   Misc. Labs: Troponin POC 07/16/2014 0.06  Imaging results:  Dg Chest 2 View  07/16/2014   CLINICAL DATA:  Short of breath.  Bilateral feet edema.  EXAM: CHEST  2 VIEW  COMPARISON:  04/03/2014.  FINDINGS: Cardiac silhouette is mildly enlarged. There is a left coronary artery stent. Normal mediastinal and hilar contours. Left anterior chest wall sequential pacemaker is well positioned, new from the prior exam.  Clear lungs.  No pleural effusion or pneumothorax.  Bony thorax is demineralized but intact.  IMPRESSION: No acute cardiopulmonary disease.   Electronically Signed   By: Lajean Manes M.D.   On: 07/16/2014 16:14    Other results: EKG: Atrial fibrillation/flutter, possible new nonspecific T wave inversion V2, otherwise unchanged from previous tracing  Assessment & Plan by Problem: Active Problems:   Chronic kidney disease (CKD), stage IV (severe)   DM (diabetes mellitus), type 2, uncontrolled, with renal  complications   Essential hypertension, benign   Chronic combined systolic and diastolic CHF (congestive heart failure)   CAD (coronary artery disease)   Other and unspecified hyperlipidemia   Atrial fibrillation with RVR  Acute exacerbation systolic and diastolic CHF: Last echo 01/01/8587 with LV EF: 25-30%, diffuse hypokinesis. +LE edema. Pt reports his dry weight is around 215lb. He was 230lb on 07/13/2014 and remains 230lbs on admission. [place] here [place] 12348 (BNP 04/21/2014 2044, ProBNP 03/14/2014 13003). 2 view CXR 07/16/2014 without evidence of acute cardiopulmonary changes. EKG without acute ischemic changes and initial troponin 0.06. He is receiving 120mg  lasix x1. -trend troponin -repeat EKG -daily weights, strict i/o's -lipid panel, Hgb A1c -ASA 81mg  daily -continue statin -continue carvedilol 25mg  BID  AKI on CKD, stage IV: Baseline Cr 2.1-2.3. On admission his Cr is 2.84. 2/2 increase in Lasix dosage vs cardiorenal syndrome -Urine creatinine, urine urea -Repeat BMP in the morning  Hypoglycemia: pt has DM2 on Lantus 45u QHS and 10u Novolog TID with meals at home. He reports he did not eat anything since breakfast. Glucose 39. He was given an amp of D50 and 2 cups of juice with BP up to 113 afterwards. -Accuchecks Q4hr -sensitive SSI -hold lantus and mealtime novolog for now  HTN: normotensive here. On coreg 25mg  BID, lasix 40mg  TID, hydralazine 50mg  TID, Imdur 60mg  daily at home. -continue hydralazine 50mg  TID -continue Imdur 60mg  daily -Lasix as above -continue Coreg 25mg  BID  Asthma: on dulera 200/5 mcg and albuterol prn at home. Some wheezing on exam. -continue albuterol 2 puff Q6hr prn -continue dulera 200/5 mcg 2 puff BID  A fib: on edoxaban at home. Remains in A fib on EKG today -continue edoxaban  FEN: Heart healthy, carb modified  DVT ppx: SCDs  Dispo: Disposition is deferred at this time, awaiting improvement of current medical problems. Anticipated discharge  in approximately 1 day(s).   The patient does have a current PCP Tanya Nones, MD) and does not need an [facility] hospital follow-up appointment after discharge.  The patient does not have transportation limitations that hinder transportation to clinic appointments.  Signed: Jacques Earthly, MD 07/16/2014, 5:35 PM

## 2014-07-16 NOTE — ED Provider Notes (Signed)
Medical screening examination/treatment/procedure(s) were conducted as a shared visit with non-physician practitioner(s) and myself.  I personally evaluated the patient during the encounter.   EKG Interpretation   Date/Time:  Sunday July 16 2014 13:14:14 EDT Ventricular Rate:  78 PR Interval:    QRS Duration: 110 QT Interval:  396 QTC Calculation: 451 R Axis:   90 Text Interpretation:  Atrial fibrillation Rightward axis Incomplete left  bundle branch block ST \\T \ T wave abnormality, consider lateral ischemia  Abnormal ECG Confirmed by BEATON  MD, ROBERT ([id number]) on 07/16/2014 1:26:42  PM     One week 10# weight gain now stable last 3 days since increasing Lasix to 40mg  tid from bid, minimal if any dyspnea, few bibasilar crackles with moderate edema to legs; Pt offered discharge with increased Lasix but prefers Obs for IV Lasix for HF exacerbation. Pt with PMH HF, ICD, Afib.  Babette Relic, MD 07/26/14 [phone]

## 2014-07-16 NOTE — ED Provider Notes (Signed)
CSN: [id number]     Arrival date & time 07/16/14  1303 History   First MD Initiated Contact with Patient 07/16/14 1336     Chief Complaint  Patient presents with  . Leg Swelling  . Shortness of Breath     (Consider location/radiation/quality/duration/timing/severity/associated sxs/prior Treatment) HPI Comments: The patient is a 63 year old male with past medical history of diabetes, a-fib (on edoxaban), systolic heart failure, ischemic cardiomyopathy status post ICD defibrillator presents to return chief complaint of bilateral lower tremor the swelling for one week, worsening. The patient reports gradual onset of bilateral lower extremity swelling, was evaluated by his PCP 3 days ago and had his Lasix increased from twice a day to 3 times a day, without resolution of symptoms. He reports 10 pounds weight gain in the past one week.  Denies shortness of breath, chest pain.  Pts daughter reports pt has had decreased energy and chills since procedure.  Denies fever, cough, increase use of inhaler. Cardiologist: Maryln Gottron [facility])   The history is provided by the patient. No language interpreter was used.    Past Medical History  Diagnosis Date  . Diabetes mellitus without complication   . Systolic and diastolic CHF, chronic     a. Echo at [facility] 11/17/2013 Ef <25%, severe hypokinesis of LV, moderately dilated LA, grade IV/IV diastolic dysfunction with irreversible restrictive pattern of mitral inflow, severe pulm HTN (RVSP >69mmHg), 1-2+ MR  b. Echo at [facility] 04/02/14 EF 25-30%, moderate concentric LVH, diffuse hypokinesis, moderately dilated LA, no significant MR observed  . Kidney failure   . CAD (coronary artery disease)     a. PCI LAD/RCA in 2007  b. PCI LAD/RCA in 2009 at [facility]  c. PCI LAD 08/2013   . Hyperlipidemia   . CVA (cerebral infarction)   . Stroke   . Dementia    Past Surgical History  Procedure Laterality Date  . Coronary stent placement    . Cardiac  defibrillator placement     Family History  Problem Relation Age of Onset  . Heart disease Mother    History  Substance Use Topics  . Smoking status: Never Smoker   . Smokeless tobacco: Not on file  . Alcohol Use: Not on file    Review of Systems  Constitutional: Positive for chills. Negative for fever.  Respiratory: Negative for shortness of breath.   Cardiovascular: Positive for leg swelling. Negative for chest pain and palpitations.  Gastrointestinal: Negative for nausea, vomiting and abdominal pain.  Genitourinary: Negative for dysuria.      Allergies  Review of patient's allergies indicates no known allergies.  Home Medications   Prior to Admission medications   Medication Sig Start Date End Date Taking? Authorizing Provider  edoxaban (SAVAYSA) 30 MG TABS tablet Take 30 mg by mouth daily. 07/07/14  Yes Historical Provider, MD  hydrALAZINE (APRESOLINE) 50 MG tablet Take 50 mg by mouth 3 (three) times daily. 06/23/14 06/23/15 Yes Historical Provider, MD  isosorbide mononitrate (IMDUR) 60 MG 24 hr tablet Take 60 mg by mouth daily. 06/23/14 06/23/15 Yes Historical Provider, MD  atorvastatin (LIPITOR) 40 MG tablet Take 1 tablet (40 mg total) by mouth daily. 04/08/14   Geradine Girt, DO  carvedilol (COREG) 25 MG tablet Take 25 mg by mouth 2 (two) times daily. 07/12/14   Historical Provider, MD  cholecalciferol (VITAMIN D) 1000 UNITS tablet Take 2,000 Units by mouth daily.    Historical Provider, MD  clopidogrel (PLAVIX) 75 MG tablet Take 1 tablet (  75 mg total) by mouth daily with breakfast. 04/08/14   Geradine Girt, DO  furosemide (LASIX) 40 MG tablet Take 40 mg by mouth 2 (two) times daily.    Historical Provider, MD  insulin aspart (NOVOLOG) 100 UNIT/ML injection Inject 10 Units into the skin 3 (three) times daily with meals. 04/08/14   Geradine Girt, DO  insulin glargine (LANTUS) 100 UNIT/ML injection Inject 0.45 mLs (45 Units total) into the skin at bedtime. 04/08/14   Geradine Girt, DO  metoprolol tartrate (LOPRESSOR) 25 MG tablet Take 1 tablet (25 mg total) by mouth 2 (two) times daily. 04/08/14   Geradine Girt, DO  Multiple Vitamin (MULTIVITAMIN WITH MINERALS) TABS tablet Take 1 tablet by mouth daily.    Historical Provider, MD  warfarin (COUMADIN) 7.5 MG tablet Take 1 tablet (7.5 mg total) by mouth daily. 04/08/14   Jessica U Vann, DO   BP 120/72  Pulse 76  Temp(Src) 98.3 F (36.8 C) (Oral)  Resp 23  Ht 5\' 6"  (1.676 m)  SpO2 97% Physical Exam  Nursing note and vitals reviewed. Constitutional: He is oriented to person, place, and time. He appears well-developed and well-nourished. No distress.  HENT:  Head: Normocephalic and atraumatic.  Eyes: Pupils are equal, round, and reactive to light.  Neck: Neck supple.  Cardiovascular: Normal rate.  An irregularly irregular rhythm present.  2+ pitting edema to bilateral lower extremities, extending 3/4 up tibia.  Pulmonary/Chest: Effort normal and breath sounds normal. No respiratory distress. He has no decreased breath sounds. He has no wheezes. He has no rhonchi. He has no rales.  Abdominal: Soft. There is no tenderness. There is no rebound.  Musculoskeletal: Normal range of motion.  Neurological: He is alert and oriented to person, place, and time.  Skin: Skin is warm and dry. He is not diaphoretic.  Psychiatric: He has a normal mood and affect. His behavior is normal.    ED Course  Procedures (including critical care time) Labs Review Results for orders placed during the hospital encounter of 07/16/14  CBC      Result Value Ref Range   WBC 8.8  4.0 - 10.5 K/uL   RBC 3.10 (*) 4.22 - 5.81 MIL/uL   Hemoglobin 9.1 (*) 13.0 - 17.0 g/dL   HCT 28.4 (*) 39.0 - 52.0 %   MCV 91.6  78.0 - 100.0 fL   MCH 29.4  26.0 - 34.0 pg   MCHC 32.0  30.0 - 36.0 g/dL   RDW 14.5  11.5 - 15.5 %   Platelets 194  150 - 400 K/uL  BASIC METABOLIC PANEL      Result Value Ref Range   Sodium 142  137 - 147 mEq/L   Potassium 3.8   3.7 - 5.3 mEq/L   Chloride 101  96 - 112 mEq/L   CO2 28  19 - 32 mEq/L   Glucose, Bld 36 (*) 70 - 99 mg/dL   BUN 52 (*) 6 - 23 mg/dL   Creatinine, Ser 2.84 (*) 0.50 - 1.35 mg/dL   Calcium 9.2  8.4 - 10.5 mg/dL   GFR calc non Af Amer 22 (*) >90 mL/min   GFR calc Af Amer 26 (*) >90 mL/min   Anion gap 13  5 - 15  PRO B NATRIURETIC PEPTIDE      Result Value Ref Range   Pro B Natriuretic peptide (BNP) 12348.0 (*) 0 - 125 pg/mL  URINALYSIS, ROUTINE W REFLEX MICROSCOPIC  Result Value Ref Range   Color, Urine YELLOW  YELLOW   APPearance CLEAR  CLEAR   Specific Gravity, Urine 1.012  1.005 - 1.030   pH 6.0  5.0 - 8.0   Glucose, UA NEGATIVE  NEGATIVE mg/dL   Hgb urine dipstick NEGATIVE  NEGATIVE   Bilirubin Urine NEGATIVE  NEGATIVE   Ketones, ur NEGATIVE  NEGATIVE mg/dL   Protein, ur 100 (*) NEGATIVE mg/dL   Urobilinogen, UA 1.0  0.0 - 1.0 mg/dL   Nitrite NEGATIVE  NEGATIVE   Leukocytes, UA NEGATIVE  NEGATIVE  URINE MICROSCOPIC-ADD ON      Result Value Ref Range   Squamous Epithelial / LPF RARE  RARE   WBC, UA 0-2  <3 WBC/hpf   RBC / HPF 0-2  <3 RBC/hpf  I-STAT TROPOININ, ED      Result Value Ref Range   Troponin i, poc 0.06  0.00 - 0.08 ng/mL   Comment 3           CBG MONITORING, ED      Result Value Ref Range   Glucose-Capillary 39 (*) 70 - 99 mg/dL   Comment 1 Notify RN    CBG MONITORING, ED      Result Value Ref Range   Glucose-Capillary 113 (*) 70 - 99 mg/dL   Dg Chest 2 View  07/16/2014   CLINICAL DATA:  Short of breath.  Bilateral feet edema.  EXAM: CHEST  2 VIEW  COMPARISON:  04/03/2014.  FINDINGS: Cardiac silhouette is mildly enlarged. There is a left coronary artery stent. Normal mediastinal and hilar contours. Left anterior chest wall sequential pacemaker is well positioned, new from the prior exam.  Clear lungs.  No pleural effusion or pneumothorax.  Bony thorax is demineralized but intact.  IMPRESSION: No acute cardiopulmonary disease.   Electronically Signed   By:  Lajean Manes M.D.   On: 07/16/2014 16:14   Imaging Review No results found.   EKG Interpretation   Date/Time:  Sunday July 16 2014 13:14:14 EDT Ventricular Rate:  78 PR Interval:    QRS Duration: 110 QT Interval:  396 QTC Calculation: 451 R Axis:   90 Text Interpretation:  Atrial fibrillation Rightward axis Incomplete left  bundle branch block ST \\T \ T wave abnormality, consider lateral ischemia  Abnormal ECG Confirmed by BEATON  MD, ROBERT ([id number]) on 07/16/2014 1:26:42  PM      MDM   Final diagnoses:  Acute on chronic heart failure, unspecified heart failure type  Chronic a-fib  Hypoglycemia   Patient presents with bilateral lower extremity edema for one week with 10 pound weight gain, patient in no acute distress oxygen saturation 96% room air, EKG shows A- fib. Patient followed by [facility], recent ICD placed 9/3 Implanted Device   Biotronik Defibrillator model # [id number] serial # [id number]. Likely acute on chronic CHF. GarlandN. notified provider that patient's CBG was 36, patient was mentating appropriately alert in no acute distress upon entering the room the D50 was given 2 juice cups were given. Will re-evaluate. Patient reports taking 10 units of NovoLog at approximately 9 AM, states did not eat after injection. Repeat CBG 113. X-ray shows mild cardiac margin and in no acute findings[place] [address]. Plan to admit for acute on chronic heart failure for IV Lasix and further monitoring Dr. Stevie Kern also evaluated the patient during this encounter. Will give one IV dose of lasix 120mg  and admit for observation. Pt admitted to Dr. Doristine Section service.  Meds given in ED:  Medications  furosemide (LASIX) 120 mg in dextrose 5 % 50 mL IVPB (not administered)  nitroGLYCERIN (NITROGLYN) 2 % ointment 1 inch (not administered)  dextrose 50 % solution 50 mL (50 mLs Intravenous Given 07/16/14 1510)    New Prescriptions   No medications on file     Harvie Heck, PA-C 07/16/14 1733

## 2014-07-16 NOTE — ED Notes (Signed)
Patient was being transported xray, received critical low glucose from the microlab.  Serum glucose: 39.  Retrieved patient before he made it down hall and placed back into room, notified Harvie Heck of critical low glucose, amp of D50 order received and administered along with 2 cups of juice.  CBG checked to confirm: 67

## 2014-07-16 NOTE — ED Notes (Signed)
Tony Freeman at bedside

## 2014-07-16 NOTE — ED Notes (Signed)
Patient transported to X-ray 

## 2014-07-17 ENCOUNTER — Inpatient Hospital Stay (HOSPITAL_COMMUNITY): Payer: Medicare HMO

## 2014-07-17 DIAGNOSIS — N184 Chronic kidney disease, stage 4 (severe): Secondary | ICD-10-CM

## 2014-07-17 DIAGNOSIS — E1169 Type 2 diabetes mellitus with other specified complication: Secondary | ICD-10-CM

## 2014-07-17 DIAGNOSIS — I509 Heart failure, unspecified: Secondary | ICD-10-CM

## 2014-07-17 DIAGNOSIS — E1165 Type 2 diabetes mellitus with hyperglycemia: Secondary | ICD-10-CM

## 2014-07-17 DIAGNOSIS — N179 Acute kidney failure, unspecified: Secondary | ICD-10-CM

## 2014-07-17 DIAGNOSIS — IMO0002 Reserved for concepts with insufficient information to code with codable children: Secondary | ICD-10-CM

## 2014-07-17 DIAGNOSIS — I129 Hypertensive chronic kidney disease with stage 1 through stage 4 chronic kidney disease, or unspecified chronic kidney disease: Secondary | ICD-10-CM

## 2014-07-17 DIAGNOSIS — J45909 Unspecified asthma, uncomplicated: Secondary | ICD-10-CM

## 2014-07-17 DIAGNOSIS — I5043 Acute on chronic combined systolic (congestive) and diastolic (congestive) heart failure: Secondary | ICD-10-CM

## 2014-07-17 DIAGNOSIS — I4891 Unspecified atrial fibrillation: Secondary | ICD-10-CM

## 2014-07-17 LAB — RAPID URINE DRUG SCREEN, HOSP PERFORMED
AMPHETAMINES: NOT DETECTED
BENZODIAZEPINES: NOT DETECTED
Barbiturates: NOT DETECTED
Cocaine: NOT DETECTED
Opiates: NOT DETECTED
Tetrahydrocannabinol: NOT DETECTED

## 2014-07-17 LAB — GLUCOSE, CAPILLARY
GLUCOSE-CAPILLARY: 106 mg/dL — AB (ref 70–99)
GLUCOSE-CAPILLARY: 245 mg/dL — AB (ref 70–99)
Glucose-Capillary: 154 mg/dL — ABNORMAL HIGH (ref 70–99)
Glucose-Capillary: 200 mg/dL — ABNORMAL HIGH (ref 70–99)

## 2014-07-17 LAB — COMPREHENSIVE METABOLIC PANEL
ALT: 16 U/L (ref 0–53)
ANION GAP: 12 (ref 5–15)
AST: 28 U/L (ref 0–37)
Albumin: 3 g/dL — ABNORMAL LOW (ref 3.5–5.2)
Alkaline Phosphatase: 108 U/L (ref 39–117)
BUN: 50 mg/dL — AB (ref 6–23)
CO2: 27 mEq/L (ref 19–32)
CREATININE: 2.59 mg/dL — AB (ref 0.50–1.35)
Calcium: 9.1 mg/dL (ref 8.4–10.5)
Chloride: 103 mEq/L (ref 96–112)
GFR calc non Af Amer: 25 mL/min — ABNORMAL LOW (ref >90)
GFR, EST AFRICAN AMERICAN: 29 mL/min — AB (ref >90)
GLUCOSE: 139 mg/dL — AB (ref 70–99)
Potassium: 3.9 mEq/L (ref 3.7–5.3)
SODIUM: 142 meq/L (ref 137–147)
TOTAL PROTEIN: 6.6 g/dL (ref 6.0–8.3)
Total Bilirubin: 1.4 mg/dL — ABNORMAL HIGH (ref 0.3–1.2)

## 2014-07-17 LAB — HEMOGLOBIN A1C
Hgb A1c MFr Bld: 6.9 % — ABNORMAL HIGH (ref <5.7)
Mean Plasma Glucose: 151 mg/dL — ABNORMAL HIGH (ref <117)

## 2014-07-17 LAB — LIPID PANEL
Cholesterol: 77 mg/dL (ref 0–200)
HDL: 27 mg/dL — AB (ref >39)
LDL CALC: 40 mg/dL (ref 0–99)
Total CHOL/HDL Ratio: 2.9 RATIO
Triglycerides: 51 mg/dL (ref <150)
VLDL: 10 mg/dL (ref 0–40)

## 2014-07-17 LAB — CBC WITH DIFFERENTIAL/PLATELET
BASOS ABS: 0 10*3/uL (ref 0.0–0.1)
Basophils Relative: 0 % (ref 0–1)
EOS PCT: 4 % (ref 0–5)
Eosinophils Absolute: 0.3 10*3/uL (ref 0.0–0.7)
HCT: 28.6 % — ABNORMAL LOW (ref 39.0–52.0)
Hemoglobin: 9 g/dL — ABNORMAL LOW (ref 13.0–17.0)
LYMPHS PCT: 12 % (ref 12–46)
Lymphs Abs: 0.8 10*3/uL (ref 0.7–4.0)
MCH: 28.9 pg (ref 26.0–34.0)
MCHC: 31.5 g/dL (ref 30.0–36.0)
MCV: 92 fL (ref 78.0–100.0)
Monocytes Absolute: 1 10*3/uL (ref 0.1–1.0)
Monocytes Relative: 14 % — ABNORMAL HIGH (ref 3–12)
NEUTROS ABS: 4.9 10*3/uL (ref 1.7–7.7)
Neutrophils Relative %: 70 % (ref 43–77)
PLATELETS: 180 10*3/uL (ref 150–400)
RBC: 3.11 MIL/uL — ABNORMAL LOW (ref 4.22–5.81)
RDW: 14.7 % (ref 11.5–15.5)
WBC: 6.9 10*3/uL (ref 4.0–10.5)

## 2014-07-17 LAB — TROPONIN I: Troponin I: <0.3 ng/mL (ref <0.30)

## 2014-07-17 LAB — UREA NITROGEN, URINE: Urea Nitrogen, Ur: 408 mg/dL

## 2014-07-17 LAB — MAGNESIUM: MAGNESIUM: 2.2 mg/dL (ref 1.5–2.5)

## 2014-07-17 MED ORDER — FUROSEMIDE 80 MG PO TABS
80.0000 mg | ORAL_TABLET | Freq: Two times a day (BID) | ORAL | Status: DC
  Administered 2014-07-18 – 2014-07-19 (×3): 80 mg via ORAL
  Filled 2014-07-17 (×5): qty 1

## 2014-07-17 MED ORDER — FUROSEMIDE 80 MG PO TABS
80.0000 mg | ORAL_TABLET | Freq: Two times a day (BID) | ORAL | Status: DC
  Filled 2014-07-17 (×2): qty 1

## 2014-07-17 MED ORDER — DIPHENHYDRAMINE HCL 25 MG PO CAPS
25.0000 mg | ORAL_CAPSULE | Freq: Every evening | ORAL | Status: DC | PRN
  Administered 2014-07-17 – 2014-07-18 (×2): 25 mg via ORAL
  Filled 2014-07-17 (×3): qty 1

## 2014-07-17 MED ORDER — DEXTROSE 5 % IV SOLN
120.0000 mg | Freq: Once | INTRAVENOUS | Status: AC
  Administered 2014-07-17: 120 mg via INTRAVENOUS
  Filled 2014-07-17: qty 12

## 2014-07-17 NOTE — Progress Notes (Signed)
Subjective: Tony Freeman reports he feels well this morning. Denies SOB or chest pain. Also denies nausea, vomiting or diarrhea.  Objective: Vital signs in last 24 hours: Filed Vitals:   07/16/14 1945 07/16/14 2034 07/17/14 0147 07/17/14 0601  BP: 108/64 138/84 116/68 116/64  Pulse:  85 80 88  Temp:  98.2 F (36.8 C) 98.5 F (36.9 C) 98.3 F (36.8 C)  TempSrc:  Oral Oral Oral  Resp: 29 22 18 18   Height:  5\' 6"  (1.676 m)    Weight:  228 lb 9.6 oz (103.692 kg)    SpO2: 97% 96% 95% 98%   Weight change:   Intake/Output Summary (Last 24 hours) at 07/17/14 0947 Last data filed at 07/17/14 0800  Gross per 24 hour  Intake      0 ml  Output   1875 ml  Net  -1875 ml   General Apperance: NAD  Head: Normocephalic, atraumatic  Eyes: PERRL, EOMI, anicteric sclera  Ears: Normal external ear canal  Nose: Nares normal, septum midline, mucosa normal  Throat: Lips, mucosa and tongue normal  Neck: Supple, trachea midline  Back: No tenderness or bony abnormality  Lungs: Clear to auscultation bilaterally. minimal expiratory wheezes, No rhonchi or rales. Breathing comfortably  Chest Wall: Nontender, no deformity  Heart: Irregularly irregular, no murmur/rub/gallop  Abdomen: Soft, nontender, nondistended, no rebound/guarding  Extremities: Normal, atraumatic, warm and well perfused, 2+ pitting edema to BLE to just below knees  Pulses: 2+ throughout  Skin: No rashes or lesions  Neurologic: Alert and oriented x 3. CNII-XII intact. Normal strength and sensation   Lab Results: Basic Metabolic Panel:  Recent Labs Lab 07/16/14 1401 07/17/14 0350  NA 142 142  K 3.8 3.9  CL 101 103  CO2 28 27  GLUCOSE 36* 139*  BUN 52* 50*  CREATININE 2.84* 2.59*  CALCIUM 9.2 9.1  MG  --  2.2   Liver Function Tests:  Recent Labs Lab 07/17/14 0350  AST 28  ALT 16  ALKPHOS 108  BILITOT 1.4*  PROT 6.6  ALBUMIN 3.0*   CBC:  Recent Labs Lab 07/16/14 1401 07/17/14 0350  WBC 8.8 6.9    NEUTROABS  --  4.9  HGB 9.1* 9.0*  HCT 28.4* 28.6*  MCV 91.6 92.0  PLT 194 180   Cardiac Enzymes:  Recent Labs Lab 07/16/14 2314 07/17/14 0350  TROPONINI <0.30 <0.30   BNP:  Recent Labs Lab 07/16/14 1401  PROBNP 12348.0*   CBG:  Recent Labs Lab 07/16/14 1511 07/16/14 1548 07/16/14 1728 07/16/14 1850 07/16/14 2033 07/17/14 0632  GLUCAP 39* 113* 77 86 168* 106*   Hemoglobin A1C: No results found for this basename: HGBA1C,  in the last 168 hours  Fasting Lipid Panel:  Recent Labs Lab 07/17/14 0350  CHOL 77  HDL 27*  LDLCALC 40  TRIG 51  CHOLHDL 2.9   Urinalysis:  Recent Labs Lab 07/16/14 1600  COLORURINE YELLOW  LABSPEC 1.012  PHURINE 6.0  GLUCOSEU NEGATIVE  HGBUR NEGATIVE  BILIRUBINUR NEGATIVE  KETONESUR NEGATIVE  PROTEINUR 100*  UROBILINOGEN 1.0  NITRITE NEGATIVE  LEUKOCYTESUR NEGATIVE   Studies/Results: Dg Chest 2 View  07/16/2014   CLINICAL DATA:  Short of breath.  Bilateral feet edema.  EXAM: CHEST  2 VIEW  COMPARISON:  04/03/2014.  FINDINGS: Cardiac silhouette is mildly enlarged. There is a left coronary artery stent. Normal mediastinal and hilar contours. Left anterior chest wall sequential pacemaker is well positioned, new from the prior exam.  Clear lungs.  No pleural effusion or pneumothorax.  Bony thorax is demineralized but intact.  IMPRESSION: No acute cardiopulmonary disease.   Electronically Signed   By: Lajean Manes M.D.   On: 07/16/2014 16:14   Medications: I have reviewed the patient's current medications. Scheduled Meds: . aspirin  81 mg Oral Daily  . atorvastatin  40 mg Oral Daily  . carvedilol  25 mg Oral BID WC  . cholecalciferol  2,000 Units Oral Daily  . edoxaban  30 mg Oral Daily  . hydrALAZINE  50 mg Oral TID  . insulin aspart  0-9 Units Subcutaneous TID WC  . isosorbide mononitrate  60 mg Oral Daily  . mometasone-formoterol  2 puff Inhalation BID  . multivitamin with minerals  1 tablet Oral Daily  . sodium  chloride  3 mL Intravenous Q12H   Continuous Infusions:  PRN Meds:.albuterol, diphenhydrAMINE Assessment/Plan: Principal Problem:   Acute on chronic combined systolic and diastolic congestive heart failure Active Problems:   Chronic kidney disease (CKD), stage IV (severe)   DM (diabetes mellitus), type 2, uncontrolled, with renal complications   Essential hypertension, benign   CAD (coronary artery disease)   Other and unspecified hyperlipidemia   Atrial fibrillation with RVR   Lower extremity edema   Hypoglycemia   Acute renal failure superimposed on stage 4 chronic kidney disease   Acute on chronic combined systolic and diastolic heart failure  Acute exacerbation systolic and diastolic CHF: Troponins continue to be negative x 2. UOP 1.5L yesterday, -455ml since. Lipid panel with HDL 27, otherwise wnl. EKG unchanged this morning. -daily weights, strict i/o's  -Hgb A1c pending -ASA 81mg  daily  -continue statin  -continue carvedilol 25mg  BID  -Diurese with additional 120mg  Lasix x 1 then transition to PO 80mg  BID this afternoon.  AKI on CKD, stage IV: Baseline Cr 2.1-2.3. On admission his Cr is 2.84. Improved to 2.59 this morning. FEurea 30.2% - prerenal. -continue to monitor  Hypoglycemia: BG 106-168 overnight -Accuchecks Q4hr  -sensitive SSI  -hold home lantus and mealtime novolog  HTN: normotensive here. On coreg 25mg  BID, lasix 40mg  TID, hydralazine 50mg  TID, Imdur 60mg  daily at home.  -continue hydralazine 50mg  TID  -continue Imdur 60mg  daily  -Lasix as above  -continue Coreg 25mg  BID   Asthma: on dulera 200/5 mcg and albuterol prn at home. -continue albuterol 2 puff Q6hr prn  -continue dulera 200/5 mcg 2 puff BID   A fib: on edoxaban at home. -continue edoxaban   FEN: Heart healthy, carb modified   DVT ppx: SCDs, edoxaban  Dispo: Disposition is deferred at this time, awaiting improvement of current medical problems.  Anticipated discharge in approximately 1  day(s).   The patient does have a current PCP Tanya Nones, MD) and does not need an [facility] hospital follow-up appointment after discharge.  The patient does not have transportation limitations that hinder transportation to clinic appointments.  .Services Needed at time of discharge: Y = Yes, Blank = No PT:   OT:   RN:   Equipment:   Other:     LOS: 1 day   Jacques Earthly, MD 07/17/2014, 9:47 AM

## 2014-07-17 NOTE — Care Management Note (Addendum)
Page 2 of 4   07/31/2014     1:10:59 PM CARE MANAGEMENT NOTE 07/31/2014  Patient:  Tony Freeman, Tony Freeman   Account Number:  [id number]  Date Initiated:  07/17/2014  Documentation initiated by:  Ronia Hazelett  Subjective/Objective Assessment:   CHF     Action/Plan:   CM to follow for disposition needs   Anticipated DC Date:  07/31/2014   Anticipated DC Plan:  [place]  CM consult      [facility] Choice  Resumption Of Svcs/PTA Provider   Choice offered to / List presented to:  NA   DME arranged  [place]      DME agency  [place] arranged  HH-1 RN  [place].   Status of service:  Completed, signed off Medicare Important Message given?  YES (If response is "NO", the following Medicare IM given date fields will be blank) Date Medicare IM given:  07/31/2014 Medicare IM given by:  [place] Date Additional Medicare IM given:  07/28/2014 Additional Medicare IM given by:  Bryson Gavia  Discharge Disposition:  [place]  Per UR Regulation:  Reviewed for med. necessity/level of care/duration of stay  If discussed at [place] of Stay Meetings, dates discussed:   07/20/2014  07/25/2014  07/27/2014  07/27/2014  08/01/2014    Comments:  Donella Stade Marsalis Beaulieu RN, BSN, MSHL, CCM  Nurse - Case Manager,  (Unit [place])  [phone]  07/31/2014 PT RECS UPDATED:  OP PT  Follow up with Tanya Nones, MD On 07/21/2014. (Your appointment is at 9:30 AM. The office asks that you show up at 9:15 AM.)   Specialty: Family Medicine   Contact information:   [place] [address] [phone]    Follow up with [place]. (Registered Nurse and Physical Therapy services to start within 24-48 hours post hospital discharge)   Contact information:   [address] [place]  [address] [phone]     Follow up with Rande Brunt, NP On 08/04/2014. (at 08:45am-- in the [facility]--Gate code-0500--please bring meds)   Specialty: Nurse Practitioner   [address] [place] [address] [phone]    Follow up with [facility].. (Patient to pick up free 30 day supply of Savaysa (edoxoban) at [place] at discharge)   [address], [place], [place] [address] [phone]    Follow up with [facility] Instructions. (Patient to continue follow up on Savays Patient Assistance Program with Social Worker, Kennyth Lose at Dr. Clayborne Dana office)    Follow up with Savaysa Refills. (Patient to notify  his [place] of needed refill at least (1) week ahead of refill date in order for pharmacy to special order medication and have available for refill)    Follow up with [facility]. (Outpatient Rehab / Physical Therapy Services)     [address] [place] [address] [place], [place] [address] Phone [phone]  Dispo Plan: Edoxaban 30 mg daily ---> will need 30 day written script Home /  Resume HHS:  RN,   and OP Cardiac Rehab when appropriate.  ([place] notified) OP PT [place] / order faxed DME:  RW ([name] notified) THN: Active [facility]:  Active OP CM Services (unknown provider)  Zykeria Laguardia RN, BSN, MSHL, CCM  Nurse - Case Manager,  (Unit [place])  [phone]  07/28/2014 S/W STEPHANIE  @ [employer] RX # [phone] EDOXABAN  30 MG DAILY   ( NOT ON FORMULARY ) SAVAYSA 30 MG DAILY- NOT COVER TIER-3 DRUG PRICE $ 278.06  XARELTO 20 MG DAILY COVER-YES CO-PAY- $ 150.35 PRIOR APPROVAL -NO PHARMACY : [facility], [facility], [facility], [facility] Patient will need single RX for Savaysa to utilize the 30 day free trial sample card. Patient Medication Assistance Program activated on 07/28/2014.  They will be contacting Dr. Clayborne Dana office re:  outcome. Program will attempt to negotiate a  lower copay at a different Tier level and prior auth may be required.  They will contact you with any additional information needed and / or instructions. The 30 day card will allow some coverage during this time. 30 day card left in patients room [place], BSN, MSHL, CCM  Nurse - Case Manager,  (Unit [place])  [phone]  07/28/2014   Mariann Laster RN, BSN, MSHL, CCM  Nurse - Case Manager,  (Unit [place])  [phone]  07/28/2014 PT RECS:  HH PT ([facility] notified) Dispo Plan: Home /  Resume HHS:  RN, PT  and OP Cardiac Rehab when appropriate.  ([place]) DME:  RW ([facility]/[name] notified)     Grover Robinson RN, BSN, MSHL, CCM  Nurse - Case Manager,  (Unit [place])  [phone]  07/26/2014 TEE-DCCV procedure pending. PT RECS:  HH PT ([facility]/Donna notified) Dispo Plan: Home /  Resume HHS:  RN, PT  and OP Cardiac Rehab when appropriate.   Sharif Rendell RN, BSN, MSHL, CCM  Nurse - Case Manager,  (Unit [phone]  07/24/2014 IV Lasix 80 bid and milrinone (PRIMACOR) 20 MG/100ML (0.2 mg/mL) infusion  :  Dose 0.25 mcg/kg/min  103.9 kg  : Admin Dose 25.975 mcg/min  :  7.8 mL/hr  :  Intravenous  : Continuous PT RECS:  OP Cardiac Rehab HHS:  [facility]/Assigned  Nurse Patty [phone] Other:  Possible CM services through [facility] /[facility] [phone] - Patty thinks that this contact has provided patient with prepackaged meals.  CM attempted contact with [facility] but VM has not been set up and UTR. Dispo Plan: Home /  Resume HHS:  RN with ([place] / [place]ch [place] notified) OP Cardiac Rehab

## 2014-07-17 NOTE — Progress Notes (Signed)
UR completed Shalawn Wynder K. Kavi Almquist, RN, BSN, [place], CCM  07/17/2014 11:05 AM

## 2014-07-17 NOTE — Progress Notes (Signed)
  Date: 07/17/2014  Patient name: Tony Freeman [facility]  Medical record number: [id number]  Date of birth: Mar 17, 1951   I have seen and evaluated [name] and discussed their care with the Residency Team.  Briefly, Mr. 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  He also had an increase in weight from 218 to 230 (dry weight around 215-218).  He was changed from lasix BID to TID by his PCP, however, he continued to gain weight and have LE edema so he came to the hospital.  He received IV lasix in the ED and felt better.  Further symptoms included orthopnea, but he denied PND to me and exertional dyspnea.  This AM he felt better, but his LE edema was still present. He also had some hypoglycemia from not eating while taking insulin.    Assessment and Plan: I have seen and evaluated the patient as outlined above. I agree with the formulated Assessment and Plan as detailed in the residents' admission note, with the following changes:   1. Acute exacerbation of systolic HF - Symptoms seem to match this Dx, however, his CXR is not evident of volume overload.  - IV diuretics in the acute setting, transition to oral today - Monitor renal function closely, has improved initially with diuresis.  - Consider cardiology consult if needed and patient not improving as expected - pro BNP 12K  2. AKI on CKD - Slightly improved with diuresis, continue for now - Monitor BMP closely  3. DM2 with hypoglycemia - SSI for now while not eating well - Consider restarting home medications as diet improves  4. HTN - Continue home medications  5. Abnormal Renal US on imaging - due for repeat imaging of renal mass saw on ultrasound in June, will order repeat ultrasound.   Other issues per resident note.   Sid Falcon, MD 9/21/20152:48 PM

## 2014-07-18 ENCOUNTER — Inpatient Hospital Stay (HOSPITAL_COMMUNITY): Payer: Medicare HMO

## 2014-07-18 DIAGNOSIS — D649 Anemia, unspecified: Secondary | ICD-10-CM

## 2014-07-18 DIAGNOSIS — M25579 Pain in unspecified ankle and joints of unspecified foot: Secondary | ICD-10-CM

## 2014-07-18 LAB — GLUCOSE, CAPILLARY
GLUCOSE-CAPILLARY: 214 mg/dL — AB (ref 70–99)
Glucose-Capillary: 120 mg/dL — ABNORMAL HIGH (ref 70–99)
Glucose-Capillary: 185 mg/dL — ABNORMAL HIGH (ref 70–99)
Glucose-Capillary: 187 mg/dL — ABNORMAL HIGH (ref 70–99)

## 2014-07-18 LAB — BASIC METABOLIC PANEL
Anion gap: 14 (ref 5–15)
BUN: 48 mg/dL — ABNORMAL HIGH (ref 6–23)
CALCIUM: 8.8 mg/dL (ref 8.4–10.5)
CO2: 27 mEq/L (ref 19–32)
CREATININE: 2.59 mg/dL — AB (ref 0.50–1.35)
Chloride: 99 mEq/L (ref 96–112)
GFR calc Af Amer: 29 mL/min — ABNORMAL LOW (ref >90)
GFR, EST NON AFRICAN AMERICAN: 25 mL/min — AB (ref >90)
GLUCOSE: 124 mg/dL — AB (ref 70–99)
Potassium: 3.6 mEq/L — ABNORMAL LOW (ref 3.7–5.3)
SODIUM: 140 meq/L (ref 137–147)

## 2014-07-18 LAB — SAVE SMEAR

## 2014-07-18 LAB — CBC
HEMATOCRIT: 29.4 % — AB (ref 39.0–52.0)
HEMOGLOBIN: 9.3 g/dL — AB (ref 13.0–17.0)
MCH: 29.4 pg (ref 26.0–34.0)
MCHC: 31.6 g/dL (ref 30.0–36.0)
MCV: 93 fL (ref 78.0–100.0)
Platelets: 187 10*3/uL (ref 150–400)
RBC: 3.16 MIL/uL — ABNORMAL LOW (ref 4.22–5.81)
RDW: 15 % (ref 11.5–15.5)
WBC: 8.3 10*3/uL (ref 4.0–10.5)

## 2014-07-18 LAB — RETICULOCYTES
RBC.: 3.16 MIL/uL — ABNORMAL LOW (ref 4.22–5.81)
Retic Count, Absolute: 107.4 10*3/uL (ref 19.0–186.0)
Retic Ct Pct: 3.4 % — ABNORMAL HIGH (ref 0.4–3.1)

## 2014-07-18 LAB — IRON AND TIBC
IRON: 25 ug/dL — AB (ref 42–135)
Saturation Ratios: 9 % — ABNORMAL LOW (ref 20–55)
TIBC: 274 ug/dL (ref 215–435)
UIBC: 249 ug/dL (ref 125–400)

## 2014-07-18 LAB — LACTATE DEHYDROGENASE: LDH: 385 U/L — ABNORMAL HIGH (ref 94–250)

## 2014-07-18 LAB — FERRITIN: FERRITIN: 398 ng/mL — AB (ref 22–322)

## 2014-07-18 MED ORDER — ACETAMINOPHEN 325 MG PO TABS
650.0000 mg | ORAL_TABLET | Freq: Four times a day (QID) | ORAL | Status: DC | PRN
  Administered 2014-07-18: 650 mg via ORAL
  Filled 2014-07-18: qty 2

## 2014-07-18 MED ORDER — CARVEDILOL 25 MG PO TABS
25.0000 mg | ORAL_TABLET | Freq: Two times a day (BID) | ORAL | Status: DC
  Administered 2014-07-18 – 2014-07-24 (×13): 25 mg via ORAL
  Filled 2014-07-18 (×16): qty 1

## 2014-07-18 MED ORDER — ONDANSETRON HCL 4 MG/2ML IJ SOLN
4.0000 mg | Freq: Four times a day (QID) | INTRAMUSCULAR | Status: DC | PRN
  Administered 2014-07-18: 4 mg via INTRAVENOUS
  Filled 2014-07-18: qty 2

## 2014-07-18 MED ORDER — ACETAMINOPHEN 325 MG PO TABS
650.0000 mg | ORAL_TABLET | Freq: Once | ORAL | Status: AC
  Administered 2014-07-18: 650 mg via ORAL
  Filled 2014-07-18: qty 2

## 2014-07-18 MED ORDER — POTASSIUM CHLORIDE CRYS ER 20 MEQ PO TBCR
40.0000 meq | EXTENDED_RELEASE_TABLET | Freq: Once | ORAL | Status: AC
  Administered 2014-07-18: 40 meq via ORAL
  Filled 2014-07-18: qty 2

## 2014-07-18 NOTE — Progress Notes (Signed)
  Date: 07/18/2014  Patient name: Tony Freeman  Medical record number: [id number]  Date of birth: 09-28-1951   This patient has been seen and the plan of care was discussed with the house staff. Please see their note for complete details. I concur with their findings with the following additions/corrections:  Agree with work up for anemia.  Acute exacerbation of heart failure is improving somewhat today.   Sid Falcon, MD 07/18/2014, 3:31 PM

## 2014-07-18 NOTE — Plan of Care (Signed)
Problem: Phase I Progression Outcomes Goal: EF % per last Echo/documented,Core Reminder form on chart Outcome: Completed/Met Date Met:  07/18/14 EF 25-30% per ECHO performed on 04/02/14.

## 2014-07-18 NOTE — Progress Notes (Signed)
Subjective: Tony Freeman reports he feels well this morning. Denies SOB or chest pain. Also denies nausea, vomiting or diarrhea. His orthopnea has improved. He has R ankle pain.   Objective: Vital signs in last 24 hours: Filed Vitals:   07/18/14 0743 07/18/14 0834 07/18/14 1116 07/18/14 1346  BP: 142/75  117/81 114/67  Pulse: 89 96 77 82  Temp:    98.6 F (37 C)  TempSrc:    Oral  Resp:  18  18  Height:      Weight:      SpO2:  93%  96%   Weight change: -8 oz (-0.227 kg)  Intake/Output Summary (Last 24 hours) at 07/18/14 1522 Last data filed at 07/18/14 1343  Gross per 24 hour  Intake    785 ml  Output   1570 ml  Net   -785 ml   General Apperance: NAD  Head: Normocephalic, atraumatic  Eyes: PERRL, EOMI, anicteric sclera  Ears: Normal external ear canal  Nose: Nares normal, septum midline, mucosa normal  Throat: Lips, mucosa and tongue normal  Neck: Supple, trachea midline  Back: No tenderness or bony abnormality  Lungs: Clear to auscultation bilaterally. minimal expiratory wheezes, No rhonchi or rales. Breathing comfortably  Chest Wall: Nontender, no deformity  Heart: Irregularly irregular, no murmur/rub/gallop  Abdomen: Soft, nontender, nondistended, no rebound/guarding  Extremities: Normal, atraumatic, warm and well perfused, 2+ pitting edema to BLE to just below knees. Tender to palpation R medial and lateral ankle. Pulses: 2+ throughout  Skin: No rashes or lesions  Neurologic: Alert and oriented x 3. CNII-XII intact. Normal strength and sensation   Lab Results: Basic Metabolic Panel:  Recent Labs Lab 07/17/14 0350 07/18/14 0256  NA 142 140  K 3.9 3.6*  CL 103 99  CO2 27 27  GLUCOSE 139* 124*  BUN 50* 48*  CREATININE 2.59* 2.59*  CALCIUM 9.1 8.8  MG 2.2  --    Liver Function Tests:  Recent Labs Lab 07/17/14 0350  AST 28  ALT 16  ALKPHOS 108  BILITOT 1.4*  PROT 6.6  ALBUMIN 3.0*   CBC:  Recent Labs Lab 07/16/14 1401 07/17/14 0350  07/18/14 1259  WBC 8.8 6.9 8.3  NEUTROABS  --  4.9  --   HGB 9.1* 9.0* 9.3*  HCT 28.4* 28.6* 29.4*  MCV 91.6 92.0 93.0  PLT 194 180 187   Cardiac Enzymes:  Recent Labs Lab 07/16/14 2314 07/17/14 0350 07/17/14 1108  TROPONINI <0.30 <0.30 <0.30   BNP:  Recent Labs Lab 07/16/14 1401  PROBNP 12348.0*   CBG:  Recent Labs Lab 07/17/14 0632 07/17/14 1203 07/17/14 1641 07/17/14 2048 07/18/14 0739 07/18/14 1040  GLUCAP 106* 154* 245* 200* 120* 185*   Hemoglobin A1C:  Recent Labs Lab 07/16/14 2314  HGBA1C 6.9*    Fasting Lipid Panel:  Recent Labs Lab 07/17/14 0350  CHOL 77  HDL 27*  LDLCALC 40  TRIG 51  CHOLHDL 2.9   Urinalysis:  Recent Labs Lab 07/16/14 1600  COLORURINE YELLOW  LABSPEC 1.012  PHURINE 6.0  GLUCOSEU NEGATIVE  HGBUR NEGATIVE  BILIRUBINUR NEGATIVE  KETONESUR NEGATIVE  PROTEINUR 100*  UROBILINOGEN 1.0  NITRITE NEGATIVE  LEUKOCYTESUR NEGATIVE   Studies/Results: Dg Chest 2 View  07/16/2014   CLINICAL DATA:  Short of breath.  Bilateral feet edema.  EXAM: CHEST  2 VIEW  COMPARISON:  04/03/2014.  FINDINGS: Cardiac silhouette is mildly enlarged. There is a left coronary artery stent. Normal mediastinal and hilar contours. Left anterior  chest wall sequential pacemaker is well positioned, new from the prior exam.  Clear lungs.  No pleural effusion or pneumothorax.  Bony thorax is demineralized but intact.  IMPRESSION: No acute cardiopulmonary disease.   Electronically Signed   By: Lajean Manes M.D.   On: 07/16/2014 16:14   Dg Ankle Complete Right  07/18/2014   CLINICAL DATA:  Right ankle pain and swelling.  EXAM: RIGHT ANKLE - COMPLETE 3+ VIEW  COMPARISON:  None.  FINDINGS: No evidence of acute fracture or dislocation. Ankle mortise intact with well preserved joint space. No visible joint effusion. Ossification in the distal interosseous membrane. Atherosclerotic calcification of the anterior and posterior tibial arteries and the plantar  artery and dorsalis pedis artery in the foot.  IMPRESSION: No acute osseous abnormality. Ossification in the distal interosseous membrane consistent with a remote injury.   Electronically Signed   By: Evangeline Dakin M.D.   On: 07/18/2014 13:50   US Renal  07/17/2014   CLINICAL DATA:  Follow-up renal mass, chronic kidney disease  EXAM: RENAL/URINARY TRACT ULTRASOUND COMPLETE  COMPARISON:  05/23/2014 and 04/04/2014  FINDINGS: Right Kidney:  Length: 8.8 cm. Echogenic renal parenchyma, suggesting medical renal disease. 3.5 x 2.9 x 3.2 cm right upper pole renal cyst, possibly mildly complicated by hemorrhage on the current ultrasound, although simple on the initial prior. Additional simple 2.8 x 2.6 x 2.3 cm interpolar cyst. No hydronephrosis.  Left Kidney:  Length: 10.1 cm. Echogenic renal parenchyma, suggesting recurrent disease. 2.2 x 1.8 x 1.7 cm mildly complex/septated left upper pole renal cyst. No hydronephrosis.  Bladder:  Within normal limits.  IMPRESSION: 2.2 cm mildly complex/septated left upper pole renal cyst. Consider MR (without contrast given the patient's GFR) for further characterization.  Two right renal cysts measuring up to 3.5 cm, one of which may be complicated by hemorrhage on the current study, although both appeared simple on prior studies.  Echogenic renal parenchyma, suggesting medical renal disease.   Electronically Signed   By: [place] Hy M.D.   On: 07/17/2014 22:39   Medications: I have reviewed the patient's current medications. Scheduled Meds: . aspirin  81 mg Oral Daily  . atorvastatin  40 mg Oral Daily  . carvedilol  25 mg Oral BID WC  . cholecalciferol  2,000 Units Oral Daily  . edoxaban  30 mg Oral Daily  . furosemide  80 mg Oral BID  . hydrALAZINE  50 mg Oral TID  . insulin aspart  0-9 Units Subcutaneous TID WC  . isosorbide mononitrate  60 mg Oral Daily  . mometasone-formoterol  2 puff Inhalation BID  . multivitamin with minerals  1 tablet Oral Daily  .  sodium chloride  3 mL Intravenous Q12H   Continuous Infusions:  PRN Meds:.albuterol, diphenhydrAMINE, ondansetron (ZOFRAN) IV Assessment/Plan: Principal Problem:   Acute on chronic combined systolic and diastolic congestive heart failure Active Problems:   Chronic kidney disease (CKD), stage IV (severe)   DM (diabetes mellitus), type 2, uncontrolled, with renal complications   Essential hypertension, benign   CAD (coronary artery disease)   Other and unspecified hyperlipidemia   Atrial fibrillation with RVR   Lower extremity edema   Hypoglycemia   Acute renal failure superimposed on stage 4 chronic kidney disease   Acute on chronic combined systolic and diastolic heart failure  Acute exacerbation systolic and diastolic CHF: Weight down to 225lb this morning from 230lb at admission. -1.2L over last 24 hours. -No ACE-I or ARBs given his renal disease -daily  weights, strict i/o's  -ASA 81mg  daily  -continue statin  -continue carvedilol 25mg  BID  -continue Lasix PO 80mg  BID.  R Ankle pain: Patient is able to bear weight but has pain on ambulation. -Xray R ankle -Tylenol 650mg  Q6hr prn pain  AKI on CKD, stage IV, history of renal cystic lesion: Baseline Cr 2.1-2.3. On admission his Cr is 2.84. Stable at 2.59 this morning. FEurea 30.2% - prerenal. US kidney 04/04/2014 with 2.2 x 1.8 x 1.7 cm  complex cyst versus solid lesion in left upper pole. Repeat US kidney 07/17/2014 2.2 cm complex/septated renal cyst. -Recommend MR without contrast to further characterize. Can obtain as outpatient. -continue to monitor  Anemia: Hgb 9.3 with normal MCV. Possibly 2/2 anemia of chronic disease. -obtain FOBT -obtain Iron panel -obtain Retic count -obtain Haptoglobin -obtain Peripheral smear -obtain LDH  Hypoglycemia: No further episodes. Hgb A1c 6.9 -Accuchecks Q4hr  -sensitive SSI  -hold home lantus and mealtime novolog  HTN: normotensive here. On coreg 25mg  BID, lasix 40mg  TID, hydralazine  50mg  TID, Imdur 60mg  daily at home.  -continue hydralazine 50mg  TID  -continue Imdur 60mg  daily  -Lasix as above  -continue Coreg 25mg  BID   Asthma: on dulera 200/5 mcg and albuterol prn at home. -continue albuterol 2 puff Q6hr prn  -continue dulera 200/5 mcg 2 puff BID   A fib: on edoxaban at home. -continue edoxaban   FEN: Heart healthy, carb modified   DVT ppx: SCDs, edoxaban  Dispo: Disposition is deferred at this time, awaiting improvement of current medical problems.  Anticipated discharge in approximately 1 day(s).   The patient does have a current PCP Tanya Nones, MD) and does not need an [facility] hospital follow-up appointment after discharge.  The patient does not have transportation limitations that hinder transportation to clinic appointments.  .Services Needed at time of discharge: Y = Yes, Blank = No PT:   OT:   RN:   Equipment:   Other:     LOS: 2 days   Jacques Earthly, MD 07/18/2014, 3:22 PM

## 2014-07-19 DIAGNOSIS — M109 Gout, unspecified: Secondary | ICD-10-CM

## 2014-07-19 DIAGNOSIS — E1169 Type 2 diabetes mellitus with other specified complication: Secondary | ICD-10-CM

## 2014-07-19 LAB — CBC
HCT: 29.7 % — ABNORMAL LOW (ref 39.0–52.0)
Hemoglobin: 9.2 g/dL — ABNORMAL LOW (ref 13.0–17.0)
MCH: 28.7 pg (ref 26.0–34.0)
MCHC: 31 g/dL (ref 30.0–36.0)
MCV: 92.5 fL (ref 78.0–100.0)
Platelets: 168 10*3/uL (ref 150–400)
RBC: 3.21 MIL/uL — AB (ref 4.22–5.81)
RDW: 15 % (ref 11.5–15.5)
WBC: 8.5 10*3/uL (ref 4.0–10.5)

## 2014-07-19 LAB — BASIC METABOLIC PANEL
ANION GAP: 12 (ref 5–15)
BUN: 52 mg/dL — ABNORMAL HIGH (ref 6–23)
CO2: 29 mEq/L (ref 19–32)
Calcium: 9.4 mg/dL (ref 8.4–10.5)
Chloride: 99 mEq/L (ref 96–112)
Creatinine, Ser: 2.77 mg/dL — ABNORMAL HIGH (ref 0.50–1.35)
GFR calc non Af Amer: 23 mL/min — ABNORMAL LOW (ref >90)
GFR, EST AFRICAN AMERICAN: 27 mL/min — AB (ref >90)
Glucose, Bld: 188 mg/dL — ABNORMAL HIGH (ref 70–99)
POTASSIUM: 4 meq/L (ref 3.7–5.3)
SODIUM: 140 meq/L (ref 137–147)

## 2014-07-19 LAB — MAGNESIUM: Magnesium: 2.1 mg/dL (ref 1.5–2.5)

## 2014-07-19 LAB — TECHNOLOGIST SMEAR REVIEW

## 2014-07-19 LAB — HAPTOGLOBIN: Haptoglobin: 205 mg/dL — ABNORMAL HIGH (ref 45–215)

## 2014-07-19 LAB — GLUCOSE, CAPILLARY: GLUCOSE-CAPILLARY: 160 mg/dL — AB (ref 70–99)

## 2014-07-19 MED ORDER — PREDNISONE 20 MG PO TABS
30.0000 mg | ORAL_TABLET | Freq: Every day | ORAL | Status: DC
  Administered 2014-07-19: 30 mg via ORAL
  Filled 2014-07-19 (×3): qty 1

## 2014-07-19 MED ORDER — PREDNISONE 10 MG PO TABS: 30.0000 mg | ORAL_TABLET | Freq: Every day | ORAL | Status: DC

## 2014-07-19 MED ORDER — PREDNISONE 10 MG PO TABS
30.0000 mg | ORAL_TABLET | Freq: Every day | ORAL | Status: DC
Start: 2014-07-19 — End: 2014-07-31

## 2014-07-19 MED ORDER — HYDROCODONE-ACETAMINOPHEN 5-325 MG PO TABS
1.0000 | ORAL_TABLET | Freq: Four times a day (QID) | ORAL | Status: DC | PRN
  Administered 2014-07-19 – 2014-07-23 (×4): 1 via ORAL
  Filled 2014-07-19 (×4): qty 1

## 2014-07-19 MED ORDER — FERROUS SULFATE 325 (65 FE) MG PO TABS
325.0000 mg | ORAL_TABLET | Freq: Two times a day (BID) | ORAL | Status: DC
  Administered 2014-07-19: 325 mg via ORAL
  Filled 2014-07-19 (×4): qty 1

## 2014-07-19 MED ORDER — FUROSEMIDE 40 MG PO TABS
40.0000 mg | ORAL_TABLET | Freq: Two times a day (BID) | ORAL | Status: DC
  Administered 2014-07-19 – 2014-07-20 (×2): 40 mg via ORAL
  Filled 2014-07-19 (×4): qty 1

## 2014-07-19 NOTE — Progress Notes (Signed)
PT Cancellation Note  Patient Details Name: Tony Freeman MRN: [id number] DOB: 01-22-1951   Cancelled Treatment:    Reason Eval/Treat Not Completed: Fatigue/lethargy limiting ability to participate. Pt states he is too tired to work with PT at this time, as he had already worked with OT this afternoon. Will attempt eval again tomorrow morning.    Jolyn Lent 07/19/2014, 4:12 PM  Jolyn Lent, PT, DPT Acute Rehabilitation Services Pager: [phone]

## 2014-07-19 NOTE — Progress Notes (Addendum)
  Date: 07/19/2014  Patient name: Tony Freeman  Medical record number: [id number]  Date of birth: 09-17-1951   This patient has been seen and the plan of care was discussed with the house staff. Please see their note for complete details. I concur with their findings with the following additions/corrections:  Patient requesting to go home today. He has diuresed well and is improved clinically.  He now has ankle pain with some warmth and swelling, concern for acute gout in the setting of high dose lasix.  Will treat as such and evaluate for improvement.  His renal function is slightly worsened today, will titrate lasix to home dose.  He can likely return home tomorrow, pending PT evaluation and assistance in the home as needed.  He has follow up planned with his PCP.  His iron panel appears to be a mix of iron deficiency and chronic disease.  He may benefit from iron supplementation.    Sid Falcon, MD 07/19/2014, 2:49 PM

## 2014-07-19 NOTE — Discharge Instructions (Addendum)
Please take prednisone as instructed for your gout flare. When you follow up with your PCP on 07/21/2014, please have her re-evaluate your ankle for consideration of a prednisone taper.  Heart Failure Heart failure means your heart has trouble pumping blood. This makes it hard for your body to work well. Heart failure is usually a long-term (chronic) condition. You must take good care of yourself and follow your doctor's treatment plan. HOME CARE  Take your heart medicine as told by your doctor.  Do not stop taking medicine unless your doctor tells you to.  Do not skip any dose of medicine.  Refill your medicines before they run out.  Take other medicines only as told by your doctor or pharmacist.  Stay active if told by your doctor. The elderly and people with severe heart failure should talk with a doctor about physical activity.  Eat heart-healthy foods. Choose foods that are without trans fat and are low in saturated fat, cholesterol, and salt (sodium). This includes fresh or frozen fruits and vegetables, fish, lean meats, fat-free or low-fat dairy foods, whole grains, and high-fiber foods. Lentils and dried peas and beans (legumes) are also good choices.  Limit salt if told by your doctor.  Cook in a healthy way. Roast, grill, broil, bake, poach, steam, or stir-fry foods.  Limit fluids as told by your doctor.  Weigh yourself every morning. Do this after you pee (urinate) and before you eat breakfast. Write down your weight to give to your doctor.  Take your blood pressure and write it down if your doctor tells you to.  Ask your doctor how to check your pulse. Check your pulse as told.  Lose weight if told by your doctor.  Stop smoking or chewing tobacco. Do not use gum or patches that help you quit without your doctor's approval.  Schedule and go to doctor visits as told.  Nonpregnant women should have no more than 1 drink a day. Men should have no more than 2 drinks a day.  Talk to your doctor about drinking alcohol.  Stop illegal drug use.  Stay current with shots (immunizations).  Manage your health conditions as told by your doctor.  Learn to manage your stress.  Rest when you are tired.  If it is really hot outside:  Avoid intense activities.  Use air conditioning or fans, or get in a cooler place.  Avoid caffeine and alcohol.  Wear loose-fitting, lightweight, and light-colored clothing.  If it is really cold outside:  Avoid intense activities.  Layer your clothing.  Wear mittens or gloves, a hat, and a scarf when going outside.  Avoid alcohol.  Learn about heart failure and get support as needed.  Get help to maintain or improve your quality of life and your ability to care for yourself as needed. GET HELP IF:   You gain 03 lb/1.4 kg or more in 1 day or 05 lb/2.3 kg in a week.  You are more short of breath than usual.  You cannot do your normal activities.  You tire easily.  You cough more than normal, especially with activity.  You have any or more puffiness (swelling) in areas such as your hands, feet, ankles, or belly (abdomen).  You cannot sleep because it is hard to breathe.  You feel like your heart is beating fast (palpitations).  You get dizzy or light-headed when you stand up. GET HELP RIGHT AWAY IF:   You have trouble breathing.  There is a change in  mental status, such as becoming less alert or not being able to focus.  You have chest pain or discomfort.  You faint. MAKE SURE YOU:   Understand these instructions.  Will watch your condition.  Will get help right away if you are not doing well or get worse. Document Released: 07/22/2008 Document Revised: 02/27/2014 Document Reviewed: 11/29/2012 [facility] Patient Information 2015 [place], [place]. This information is not intended to replace advice given to you by your health care provider. Make sure you discuss any questions you have with your health care  provider.  ____  Information on my medicine - SAVASYA [facility]) What is SAVAYSA used for? SAVAYSA is a prescription medicine used to reduce the risk of stroke and blood clots in people who have atrial fibrillation not caused by a heart valve problem  Your doctor should check your kidney function before you start taking SAVAYSA. People whose kidneys work really well should not receive SAVAYSA because it may not work well to prevent stroke. SAVAYSA is also used to treat blood clots in the veins of your legs (deep vein thrombosis) or lungs (pulmonary embolism), after you have been treated with an injectable blood thinner medicine for 5 to 10 days How should I take SAVAYSA? Take SAVAYSA exactly as prescribed by your doctor.  Your doctor will decide how long you should take [place]. Do not change your dose or stop taking SAVAYSA unless your doctor tells you to. If you are taking SAVAYSA for atrial fibrillation, stopping SAVAYSA may increase your risk of having a stroke.  You can take SAVAYSA with or without food.  If you miss a dose of SAVAYSA, take it as soon as you remember on the same day. Take your next dose at your usual time the next day. Do not take more than one dose of SAVAYSA at the same time to make up for a missed dose.  Do not run out of [place]. Refill your prescription before you run out.Important Safety Information for Savaysa What is the most important information I should know about SAVAYSA? For people who take SAVAYSA for nonvalvular atrial fibrillation (a type of irregular heartbeat): People with atrial fibrillation are at an increased risk of forming a blood clot in the heart, which can travel to the brain, causing a stroke, or to other parts of the body. SAVAYSA lowers your chance of having a stroke by helping to prevent clots from forming.  Your doctor should check your kidney function before you start taking SAVAYSA. People whose kidneys work really well should not receive  SAVAYSA because it may not work as well as other medications to prevent stroke.  Do not stop taking SAVAYSA without first talking to the doctor who prescribed it for you. Stopping SAVAYSA increases your risk of having a stroke.  SAVAYSA can cause bleeding which can be serious, and sometimes lead to death. This is because SAVAYSA is a blood thinner medicine that reduces blood clotting. While taking SAVAYSA you may bruise more easily and bleeding may take longer to stop. You should call your doctor or get medical help right away if you experience bleeding that is severe (for example, coughing up or vomiting blood) or bleeding that cannot be controlled.  You may have a higher risk of bleeding if you take SAVAYSA and take other medicines that increase your risk of bleeding, including: aspirin, long-term use of non-steroidal anti-inflammatory drugs (NSAIDs) and blood thinners (warfarin, heparin, or other medicines to prevent or treat blood clots). Tell your doctor  if you take any of these medicines. Ask your doctor or pharmacist if you are not sure if your medicine is one listed above.  SAVAYSA is not for people with mechanical heart valves or people who have moderate-to-severe narrowing (stenosis) of their mitral valve.  Spinal or epidural blood clots (hematoma). People who take a blood thinner medicine (anticoagulant) like SAVAYSA, and have medicine injected into their spinal and epidural area, or have a spinal puncture have a risk of forming a blood clot that can cause long-term or permanent loss of the ability to move (paralysis). Your risk of developing a spinal or epidural blood clot is higher if: a thin tube called an epidural catheter is placed in your back to give you certain medicine, you take NSAIDs or a medicine to prevent blood from clotting, you have a history of difficult or repeated epidural or spinal punctures, and you have a history of problems with your spine or have had surgery on your  spine.  If you take SAVAYSA and receive spinal anesthesia or have a spinal puncture, your doctor should watch you closely for symptoms of spinal or epidural blood clots. Tell your doctor right away if you have back pain, tingling, numbness (especially in your legs and feet), muscle weakness, loss of control of the bowels or bladder (incontinence). Who should not take SAVAYSA? Do not take SAVAYSA if you currently have certain types of abnormal bleeding. What should I tell my doctor before taking SAVAYSA? Before you take SAVAYSA, tell your doctor if you: have liver or kidney problems, have ever had bleeding problems, have a mechanical heart valve, are pregnant or plan to become pregnant or are breastfeeding or plan to breastfeed.Tell all of your doctors and dentists that you are taking SAVAYSA. They should talk to the doctor who prescribed SAVAYSA for you, before you have any surgery, medical or dental procedure. Tell your doctor about all the medicines you take, including prescription and over-the-counter medicines, vitamins, and herbal supplements. Some of your other medicines may affect the way SAVAYSA works. Certain medicines may increase your risk of bleeding or stroke when taken with SAVAYSA. How should I take SAVAYSA? Take SAVAYSA exactly as prescribed by your doctor. Your doctor will decide how long you should take [place]. Do not change your dose or stop taking SAVAYSA unless your doctor tells you to. You can take SAVAYSA with or without food. If you miss a dose of SAVAYSA, take it as soon as you remember on the same day, and do not take more than one dose at the same time. Take your next dose at your usual time the next day. Do not run out of [place]. Refill your prescription before you run out.  If you take too much SAVAYSA, go to the nearest hospital emergency room or call your doctor right away. Call your doctor right away if you fall or injure yourself, especially if you hit your head. Your  doctor may need to check you. What are the possible side effects of SAVAYSA? Common side effects in people who take SAVAYSA include, bleeding and low red blood cell count (anemia). Talk to your doctor if you have any side effect that bothers you or that does not go away. Call your doctor for medical advice about side effects. You may report side effects to FDA at 1-800-FDA-1088.  This website has more information on www.savasya.com

## 2014-07-19 NOTE — Evaluation (Signed)
Occupational Therapy Evaluation Patient Details Name: Tony Freeman MRN: [id number] DOB: 1951/09/23 Today's Date: 07/19/2014    History of Present Illness Pt admitted with CHF exacerbation and R ankle gout.   Clinical Impression   Pt was independent and driving prior to admission.  Pt presents with unsteady gait and impaired balance primarily due to gout flare in R ankle. He was resistant to using a RW this visit. Will follow acutely. Do not anticipate pt will need follow up therapy.    Follow Up Recommendations  No OT follow up;Supervision - Intermittent    Equipment Recommendations  None recommended by OT    Recommendations for Other Services       Precautions / Restrictions Precautions Precautions: Fall Restrictions Weight Bearing Restrictions: No      Mobility Bed Mobility Overal bed mobility: Modified Independent             General bed mobility comments: HOB up  Transfers Overall transfer level: Needs assistance Equipment used: 1 person hand held assist Transfers: Sit to/from Stand Sit to Stand: Min guard         General transfer comment: Pt declined use of RW when offered.    Balance Overall balance assessment: Needs assistance Sitting-balance support: No upper extremity supported Sitting balance-Leahy Scale: Good     Standing balance support: No upper extremity supported Standing balance-Leahy Scale: Fair                              ADL Overall ADL's : Needs assistance/impaired Eating/Feeding: Independent;Sitting   Grooming: Wash/dry hands;Min guard;Standing   Upper Body Bathing: Set up;Sitting   Lower Body Bathing: Minimal assistance;Sit to/from stand   Upper Body Dressing : Set up;Sitting   Lower Body Dressing: Minimal assistance;Sit to/from stand   Toilet Transfer: Minimal assistance;Ambulation;Regular Toilet           Functional mobility during ADLs: Minimal assistance General ADL Comments: difficulty  accessing R foot due to pain     Vision                     Perception     Praxis      Pertinent Vitals/Pain Pain Assessment: 0-10 Pain Score: 5  Pain Location: R ankle Pain Descriptors / Indicators: Aching Pain Intervention(s): Premedicated before session;Monitored during session;Limited activity within patient's tolerance     Hand Dominance Right   Extremity/Trunk Assessment Upper Extremity Assessment Upper Extremity Assessment: Overall WFL for tasks assessed   Lower Extremity Assessment Lower Extremity Assessment: Defer to PT evaluation       Communication Communication Communication: No difficulties   Cognition Arousal/Alertness: Awake/alert Behavior During Therapy: WFL for tasks assessed/performed Overall Cognitive Status: Within Functional Limits for tasks assessed                     General Comments       Exercises       Shoulder Instructions      Home Living Family/patient expects to be discharged to:: Private residence Living Arrangements: Children (son, his fiance and their child) Available Help at Discharge: Family;Available 24 hours/day Type of Home: Apartment Home Access: Stairs to enter [employer] of Steps: 3 flights Entrance Stairs-Rails: Right Home Layout: One level     Bathroom Shower/Tub: Teacher, early years/pre: Standard     Home Equipment: Cane - single point;Shower seat  Prior Functioning/Environment Level of Independence: Independent             OT Diagnosis: Generalized weakness;Acute pain   OT Problem List: Decreased activity tolerance;Obesity;Decreased knowledge of use of DME or AE;Impaired balance (sitting and/or standing);Pain;Increased edema   OT Treatment/Interventions: Self-care/ADL training;DME and/or AE instruction;Patient/family education;Balance training;Therapeutic activities    OT Goals(Current goals can be found in the care plan section) Acute Rehab OT  Goals Patient Stated Goal: home with family OT Goal Formulation: With patient Time For Goal Achievement: 07/26/14 Potential to Achieve Goals: Good ADL Goals Pt Will Perform Grooming: standing;with modified independence Pt Will Perform Lower Body Bathing: sit to/from stand;with modified independence Pt Will Perform Lower Body Dressing: sit to/from stand;with modified independence Pt Will Transfer to Toilet: with modified independence;ambulating;regular height toilet Pt Will Perform Toileting - Clothing Manipulation and hygiene: with modified independence;sit to/from stand Pt Will Perform Tub/Shower Transfer: Tub transfer;with supervision;shower seat  OT Frequency: Min 2X/week   Barriers to D/C:            Co-evaluation              End of Session Nurse Communication: Mobility status  Activity Tolerance: Patient limited by pain Patient left: in chair;with call bell/phone within reach   Time: 1411-1441 OT Time Calculation (min): 30 min Charges:  OT General Charges $OT Visit: 1 Procedure OT Evaluation $Initial OT Evaluation Tier I: 1 Procedure OT Treatments $Self Care/Home Management : 8-22 mins G-Codes:    Malka So 07/19/2014, 2:45 PM [phone]

## 2014-07-19 NOTE — Progress Notes (Addendum)
Subjective: Mr. Cobbins reports his right ankle still hurts this morning. No improvement since yesterday. Denies chest pain or shortness of breath.  Objective: Vital signs in last 24 hours: Filed Vitals:   07/19/14 0449 07/19/14 0803 07/19/14 0820 07/19/14 0944  BP: 128/85 140/82  118/69  Pulse: 88 82  83  Temp: 98.4 F (36.9 C) 98.6 F (37 C)  98.5 F (36.9 C)  TempSrc: Oral Oral  Oral  Resp: 18 18  18   Height:      Weight: 226 lb 3.1 oz (102.6 kg)     SpO2: 98% 94% 93% 95%   Weight change: -3 lb 4.9 oz (-1.501 kg)  Intake/Output Summary (Last 24 hours) at 07/19/14 1122 Last data filed at 07/19/14 0459  Gross per 24 hour  Intake    542 ml  Output   1150 ml  Net   -608 ml   General Apperance: NAD  Head: Normocephalic, atraumatic  Eyes: PERRL, EOMI, anicteric sclera  Ears: Normal external ear canal  Nose: Nares normal, septum midline, mucosa normal  Throat: Lips, mucosa and tongue normal  Neck: Supple, trachea midline  Back: No tenderness or bony abnormality  Lungs: Clear to auscultation bilaterally. minimal expiratory wheezes, No rhonchi or rales. Breathing comfortably  Chest Wall: Nontender, no deformity  Heart: Irregularly irregular, no murmur/rub/gallop  Abdomen: Soft, nontender, nondistended, no rebound/guarding  Extremities: Normal, atraumatic, warm and well perfused, 2+ pitting edema to BLE to midshin. Tender to palpation R medial and lateral ankle. Pulses: 2+ throughout  Skin: No rashes or lesions  Neurologic: Alert and oriented x 3. CNII-XII intact. Normal strength and sensation   Lab Results: Basic Metabolic Panel:  Recent Labs Lab 07/17/14 0350 07/18/14 0256 07/19/14 0915  NA 142 140 140  K 3.9 3.6* 4.0  CL 103 99 99  CO2 27 27 29   GLUCOSE 139* 124* 188*  BUN 50* 48* 52*  CREATININE 2.59* 2.59* 2.77*  CALCIUM 9.1 8.8 9.4  MG 2.2  --  2.1   Liver Function Tests:  Recent Labs Lab 07/17/14 0350  AST 28  ALT 16  ALKPHOS 108  BILITOT  1.4*  PROT 6.6  ALBUMIN 3.0*   CBC:  Recent Labs Lab 07/16/14 1401 07/17/14 0350 07/18/14 1259 07/19/14 0915  WBC 8.8 6.9 8.3 8.5  NEUTROABS  --  4.9  --   --   HGB 9.1* 9.0* 9.3* 9.2*  HCT 28.4* 28.6* 29.4* 29.7*  MCV 91.6 92.0 93.0 92.5  PLT 194 180 187 168   Cardiac Enzymes:  Recent Labs Lab 07/16/14 2314 07/17/14 0350 07/17/14 1108  TROPONINI <0.30 <0.30 <0.30   BNP:  Recent Labs Lab 07/16/14 1401  PROBNP 12348.0*   CBG:  Recent Labs Lab 07/17/14 2048 07/18/14 0739 07/18/14 1040 07/18/14 1635 07/18/14 2154 07/19/14 0746  GLUCAP 200* 120* 185* 187* 214* 160*   Hemoglobin A1C:  Recent Labs Lab 07/16/14 2314  HGBA1C 6.9*    Fasting Lipid Panel:  Recent Labs Lab 07/17/14 0350  CHOL 77  HDL 27*  LDLCALC 40  TRIG 51  CHOLHDL 2.9   Urinalysis:  Recent Labs Lab 07/16/14 1600  COLORURINE YELLOW  LABSPEC 1.012  PHURINE 6.0  GLUCOSEU NEGATIVE  HGBUR NEGATIVE  BILIRUBINUR NEGATIVE  KETONESUR NEGATIVE  PROTEINUR 100*  UROBILINOGEN 1.0  NITRITE NEGATIVE  LEUKOCYTESUR NEGATIVE   Studies/Results: Dg Ankle Complete Right  07/18/2014   CLINICAL DATA:  Right ankle pain and swelling.  EXAM: RIGHT ANKLE - COMPLETE 3+ VIEW  COMPARISON:  None.  FINDINGS: No evidence of acute fracture or dislocation. Ankle mortise intact with well preserved joint space. No visible joint effusion. Ossification in the distal interosseous membrane. Atherosclerotic calcification of the anterior and posterior tibial arteries and the plantar artery and dorsalis pedis artery in the foot.  IMPRESSION: No acute osseous abnormality. Ossification in the distal interosseous membrane consistent with a remote injury.   Electronically Signed   By: Evangeline Dakin M.D.   On: 07/18/2014 13:50   US Renal  07/17/2014   CLINICAL DATA:  Follow-up renal mass, chronic kidney disease  EXAM: RENAL/URINARY TRACT ULTRASOUND COMPLETE  COMPARISON:  05/23/2014 and 04/04/2014  FINDINGS: Right  Kidney:  Length: 8.8 cm. Echogenic renal parenchyma, suggesting medical renal disease. 3.5 x 2.9 x 3.2 cm right upper pole renal cyst, possibly mildly complicated by hemorrhage on the current ultrasound, although simple on the initial prior. Additional simple 2.8 x 2.6 x 2.3 cm interpolar cyst. No hydronephrosis.  Left Kidney:  Length: 10.1 cm. Echogenic renal parenchyma, suggesting recurrent disease. 2.2 x 1.8 x 1.7 cm mildly complex/septated left upper pole renal cyst. No hydronephrosis.  Bladder:  Within normal limits.  IMPRESSION: 2.2 cm mildly complex/septated left upper pole renal cyst. Consider MR (without contrast given the patient's GFR) for further characterization.  Two right renal cysts measuring up to 3.5 cm, one of which may be complicated by hemorrhage on the current study, although both appeared simple on prior studies.  Echogenic renal parenchyma, suggesting medical renal disease.   Electronically Signed   By: Julian Hy M.D.   On: 07/17/2014 22:39   Medications: I have reviewed the patient's current medications. Scheduled Meds: . aspirin  81 mg Oral Daily  . atorvastatin  40 mg Oral Daily  . carvedilol  25 mg Oral BID WC  . cholecalciferol  2,000 Units Oral Daily  . edoxaban  30 mg Oral Daily  . furosemide  80 mg Oral BID  . hydrALAZINE  50 mg Oral TID  . insulin aspart  0-9 Units Subcutaneous TID WC  . isosorbide mononitrate  60 mg Oral Daily  . mometasone-formoterol  2 puff Inhalation BID  . multivitamin with minerals  1 tablet Oral Daily  . sodium chloride  3 mL Intravenous Q12H   Continuous Infusions:  PRN Meds:.acetaminophen, albuterol, diphenhydrAMINE, ondansetron (ZOFRAN) IV Assessment/Plan: Principal Problem:   Acute on chronic combined systolic and diastolic congestive heart failure Active Problems:   Chronic kidney disease (CKD), stage IV (severe)   DM (diabetes mellitus), type 2, uncontrolled, with renal complications   Essential hypertension, benign    CAD (coronary artery disease)   Other and unspecified hyperlipidemia   Atrial fibrillation with RVR   Lower extremity edema   Hypoglycemia   Acute renal failure superimposed on stage 4 chronic kidney disease   Acute on chronic combined systolic and diastolic heart failure  Acute exacerbation systolic and diastolic CHF: Weight 268TM from 225lb yesterday. -86mL over last 24 hours. -No ACE-I or ARBs given his renal disease -daily weights, strict i/o's  -ASA 81mg  daily  -continue statin  -continue carvedilol 25mg  BID  -Lasix PO 80mg  this morning, transition to home lasix this evening.  Gout flare: Xray R ankle with no evidence of acute osseous abnormality. -Norco 5/325mg  Q6hr prn pain -30mg  prednisone daily  AKI on CKD, stage IV, history of renal cystic lesion: Baseline Cr 2.1-2.3. On admission his Cr is 2.84. Increased this morning to 2.77. Likely 2/2 increased lasix.  -Recommend renal ultrasound  in 6-12 months since he cannot obtain an MRI due to his ICD. -continue to monitor  Anemia: LDH elevated, Iron low, TIBC wnl, Ferritin high. retic ct percent elevated, haptoglobin high. Likely 2/2 chronic disease -obtain FOBT -smear pending  Hypoglycemia: No further episodes. Hgb A1c 6.9 -Accuchecks Q4hr  -sensitive SSI  -hold home lantus and mealtime novolog  HTN: normotensive here. On coreg 25mg  BID, lasix 40mg  TID, hydralazine 50mg  TID, Imdur 60mg  daily at home.  -continue hydralazine 50mg  TID  -continue Imdur 60mg  daily  -Lasix as above  -continue Coreg 25mg  BID   Asthma: on dulera 200/5 mcg and albuterol prn at home. -continue albuterol 2 puff Q6hr prn  -continue dulera 200/5 mcg 2 puff BID   A fib: on edoxaban at home. -continue edoxaban   FEN: Heart healthy, carb modified   DVT ppx: SCDs, edoxaban  Dispo: likely today  The patient does have a current PCP Tanya Nones, MD) and does not need an [facility] hospital follow-up appointment after discharge.  The patient does  not have transportation limitations that hinder transportation to clinic appointments.  .Services Needed at time of discharge: Y = Yes, Blank = No PT:   OT:   RN:   Equipment:   Other:     LOS: 3 days   Jacques Earthly, MD 07/19/2014, 11:22 AM

## 2014-07-19 NOTE — Progress Notes (Signed)
[facility]  Patient Status: Active (receiving services up to time of hospitalization)  [facility] is providing the following services: RN  If patient discharges after hours, please call [phone].   Tony Freeman 07/19/2014, 12:45 PM

## 2014-07-20 ENCOUNTER — Encounter (HOSPITAL_COMMUNITY): Payer: Self-pay | Admitting: Physician Assistant

## 2014-07-20 DIAGNOSIS — R55 Syncope and collapse: Secondary | ICD-10-CM | POA: Diagnosis present

## 2014-07-20 DIAGNOSIS — I2510 Atherosclerotic heart disease of native coronary artery without angina pectoris: Secondary | ICD-10-CM

## 2014-07-20 LAB — GLUCOSE, CAPILLARY
GLUCOSE-CAPILLARY: 286 mg/dL — AB (ref 70–99)
GLUCOSE-CAPILLARY: 275 mg/dL — AB (ref 70–99)
GLUCOSE-CAPILLARY: 295 mg/dL — AB (ref 70–99)
Glucose-Capillary: 241 mg/dL — ABNORMAL HIGH (ref 70–99)
Glucose-Capillary: 206 mg/dL — ABNORMAL HIGH (ref 70–99)
Glucose-Capillary: 371 mg/dL — ABNORMAL HIGH (ref 70–99)
Glucose-Capillary: 334 mg/dL — ABNORMAL HIGH (ref 70–99)

## 2014-07-20 LAB — BASIC METABOLIC PANEL
Anion gap: 14 (ref 5–15)
BUN: 62 mg/dL — ABNORMAL HIGH (ref 6–23)
CALCIUM: 9.3 mg/dL (ref 8.4–10.5)
CO2: 26 mEq/L (ref 19–32)
CREATININE: 2.92 mg/dL — AB (ref 0.50–1.35)
Chloride: 93 mEq/L — ABNORMAL LOW (ref 96–112)
GFR calc non Af Amer: 22 mL/min — ABNORMAL LOW (ref >90)
GFR, EST AFRICAN AMERICAN: 25 mL/min — AB (ref >90)
Glucose, Bld: 381 mg/dL — ABNORMAL HIGH (ref 70–99)
POTASSIUM: 4.5 meq/L (ref 3.7–5.3)
SODIUM: 133 meq/L — AB (ref 137–147)

## 2014-07-20 LAB — TROPONIN I: Troponin I: <0.3 ng/mL (ref <0.30)

## 2014-07-20 MED ORDER — FUROSEMIDE 10 MG/ML IJ SOLN
80.0000 mg | Freq: Two times a day (BID) | INTRAMUSCULAR | Status: DC
  Administered 2014-07-20 – 2014-07-21 (×2): 80 mg via INTRAVENOUS
  Filled 2014-07-20 (×3): qty 8

## 2014-07-20 MED ORDER — FERROUS SULFATE 325 (65 FE) MG PO TABS
325.0000 mg | ORAL_TABLET | Freq: Two times a day (BID) | ORAL | Status: DC
  Administered 2014-07-20 – 2014-07-31 (×22): 325 mg via ORAL
  Filled 2014-07-20 (×26): qty 1

## 2014-07-20 MED ORDER — INSULIN ASPART 100 UNIT/ML ~~LOC~~ SOLN: 0.0000 [IU] | Freq: Three times a day (TID) | SUBCUTANEOUS | Status: DC

## 2014-07-20 MED ORDER — ACETAMINOPHEN 325 MG PO TABS: 325.0000 mg | ORAL_TABLET | Freq: Every day | ORAL | Status: DC | PRN

## 2014-07-20 MED ORDER — INSULIN GLARGINE 100 UNIT/ML ~~LOC~~ SOLN
20.0000 [IU] | Freq: Every day | SUBCUTANEOUS | Status: DC
  Administered 2014-07-20: 20 [IU] via SUBCUTANEOUS
  Filled 2014-07-20 (×2): qty 0.2

## 2014-07-20 MED ORDER — AMIODARONE HCL 200 MG PO TABS
400.0000 mg | ORAL_TABLET | Freq: Two times a day (BID) | ORAL | Status: DC
  Administered 2014-07-20 – 2014-07-30 (×21): 400 mg via ORAL
  Filled 2014-07-20 (×23): qty 2

## 2014-07-20 MED ORDER — PREDNISONE 20 MG PO TABS
30.0000 mg | ORAL_TABLET | Freq: Every day | ORAL | Status: AC
  Administered 2014-07-20 – 2014-07-22 (×3): 30 mg via ORAL
  Filled 2014-07-20 (×3): qty 1

## 2014-07-20 MED ORDER — MILRINONE IN DEXTROSE 20 MG/100ML IV SOLN
0.2500 ug/kg/min | INTRAVENOUS | Status: DC
  Administered 2014-07-20 – 2014-07-24 (×7): 0.25 ug/kg/min via INTRAVENOUS
  Filled 2014-07-20 (×10): qty 100

## 2014-07-20 MED ORDER — RAMELTEON 8 MG PO TABS
8.0000 mg | ORAL_TABLET | Freq: Every evening | ORAL | Status: DC | PRN
  Administered 2014-07-20 – 2014-07-27 (×6): 8 mg via ORAL
  Filled 2014-07-20 (×6): qty 1

## 2014-07-20 MED ORDER — INSULIN ASPART 100 UNIT/ML ~~LOC~~ SOLN
0.0000 [IU] | Freq: Three times a day (TID) | SUBCUTANEOUS | Status: DC
  Administered 2014-07-20: 11 [IU] via SUBCUTANEOUS
  Administered 2014-07-20 – 2014-07-21 (×2): 15 [IU] via SUBCUTANEOUS
  Administered 2014-07-21: 7 [IU] via SUBCUTANEOUS
  Administered 2014-07-21 – 2014-07-22 (×3): 11 [IU] via SUBCUTANEOUS
  Administered 2014-07-22 – 2014-07-23 (×3): 4 [IU] via SUBCUTANEOUS
  Administered 2014-07-23: 7 [IU] via SUBCUTANEOUS
  Administered 2014-07-24: 4 [IU] via SUBCUTANEOUS

## 2014-07-20 NOTE — Progress Notes (Signed)
UR completed Twylia Oka K. Cipriano Millikan, RN, BSN, MSHL, CCM  07/20/2014 11:14 AM

## 2014-07-20 NOTE — Progress Notes (Signed)
Subjective: Tony Freeman reports his right ankle has improved this morning. He feels fine overall. Denies chest pain, shortness of breath, nausea or vomiting. He reports his LE edema is back to baseline. Denies trouble urinating.  After walking up and down stairs with PT this morning, he started coughing while sititng in a chair. PT and nursing noted a 10 second period of syncope. Vitals stable.  Objective: Vital signs in last 24 hours: Filed Vitals:   07/19/14 2116 07/19/14 2120 07/20/14 0530 07/20/14 0833  BP: 125/68  114/83   Pulse: 96 91 97   Temp: 98.8 F (37.1 C)  98.1 F (36.7 C)   TempSrc: Oral  Oral   Resp: 20 18 18    Height:      Weight:   229 lb 1.6 oz (103.919 kg)   SpO2: 98% 98% 95% 94%   Weight change: 2 lb 14.5 oz (1.319 kg)  Intake/Output Summary (Last 24 hours) at 07/20/14 0946 Last data filed at 07/20/14 0542  Gross per 24 hour  Intake    960 ml  Output    400 ml  Net    560 ml   General Apperance: NAD  Head: Normocephalic, atraumatic  Eyes: PERRL, EOMI, anicteric sclera  Ears: Normal external ear canal  Nose: Nares normal, septum midline, mucosa normal  Throat: Lips, mucosa and tongue normal  Neck: Supple, trachea midline  Back: No tenderness or bony abnormality  Lungs: Clear to auscultation bilaterally. minimal expiratory wheezes, No rhonchi or rales. Breathing comfortably  Chest Wall: Nontender, no deformity  Heart: Irregularly irregular, no murmur/rub/gallop  Abdomen: Soft, nontender, nondistended, no rebound/guarding  Extremities: Normal, atraumatic, warm and well perfused, 1+ pitting edema to BLE to lower midshin. Nontender to palpation R medial and lateral ankle. Pulses: 2+ throughout  Skin: No rashes or lesions  Neurologic: Alert and oriented x 3. CNII-XII intact. Normal strength and sensation   Lab Results: Basic Metabolic Panel:  Recent Labs Lab 07/17/14 0350 07/18/14 0256 07/19/14 0915  NA 142 140 140  K 3.9 3.6* 4.0  CL 103 99  99  CO2 27 27 29   GLUCOSE 139* 124* 188*  BUN 50* 48* 52*  CREATININE 2.59* 2.59* 2.77*  CALCIUM 9.1 8.8 9.4  MG 2.2  --  2.1   Liver Function Tests:  Recent Labs Lab 07/17/14 0350  AST 28  ALT 16  ALKPHOS 108  BILITOT 1.4*  PROT 6.6  ALBUMIN 3.0*   CBC:  Recent Labs Lab 07/16/14 1401 07/17/14 0350 07/18/14 1259 07/19/14 0915  WBC 8.8 6.9 8.3 8.5  NEUTROABS  --  4.9  --   --   HGB 9.1* 9.0* 9.3* 9.2*  HCT 28.4* 28.6* 29.4* 29.7*  MCV 91.6 92.0 93.0 92.5  PLT 194 180 187 168   Cardiac Enzymes:  Recent Labs Lab 07/16/14 2314 07/17/14 0350 07/17/14 1108  TROPONINI <0.30 <0.30 <0.30   BNP:  Recent Labs Lab 07/16/14 1401  PROBNP 12348.0*   CBG:  Recent Labs Lab 07/18/14 0739 07/18/14 1040 07/18/14 1635 07/18/14 2154 07/19/14 0746 07/20/14 0752  GLUCAP 120* 185* 187* 214* 160* 371*   Hemoglobin A1C:  Recent Labs Lab 07/16/14 2314  HGBA1C 6.9*    Fasting Lipid Panel:  Recent Labs Lab 07/17/14 0350  CHOL 77  HDL 27*  LDLCALC 40  TRIG 51  CHOLHDL 2.9   Urinalysis:  Recent Labs Lab 07/16/14 1600  COLORURINE YELLOW  LABSPEC 1.012  PHURINE 6.0  GLUCOSEU NEGATIVE  HGBUR NEGATIVE  BILIRUBINUR NEGATIVE  KETONESUR NEGATIVE  PROTEINUR 100*  UROBILINOGEN 1.0  NITRITE NEGATIVE  LEUKOCYTESUR NEGATIVE   Studies/Results: Dg Ankle Complete Right  07/18/2014   CLINICAL DATA:  Right ankle pain and swelling.  EXAM: RIGHT ANKLE - COMPLETE 3+ VIEW  COMPARISON:  None.  FINDINGS: No evidence of acute fracture or dislocation. Ankle mortise intact with well preserved joint space. No visible joint effusion. Ossification in the distal interosseous membrane. Atherosclerotic calcification of the anterior and posterior tibial arteries and the plantar artery and dorsalis pedis artery in the foot.  IMPRESSION: No acute osseous abnormality. Ossification in the distal interosseous membrane consistent with a remote injury.   Electronically Signed   By:  Evangeline Dakin M.D.   On: 07/18/2014 13:50   Medications: I have reviewed the patient's current medications. Scheduled Meds: . aspirin  81 mg Oral Daily  . atorvastatin  40 mg Oral Daily  . carvedilol  25 mg Oral BID WC  . cholecalciferol  2,000 Units Oral Daily  . edoxaban  30 mg Oral Daily  . ferrous sulfate  325 mg Oral BID WC  . furosemide  40 mg Oral BID  . hydrALAZINE  50 mg Oral TID  . insulin aspart  0-9 Units Subcutaneous TID WC  . isosorbide mononitrate  60 mg Oral Daily  . mometasone-formoterol  2 puff Inhalation BID  . multivitamin with minerals  1 tablet Oral Daily  . predniSONE  30 mg Oral Q breakfast  . sodium chloride  3 mL Intravenous Q12H   Continuous Infusions:  PRN Meds:.acetaminophen, albuterol, diphenhydrAMINE, HYDROcodone-acetaminophen, ondansetron (ZOFRAN) IV Assessment/Plan: Principal Problem:   Acute on chronic combined systolic and diastolic congestive heart failure Active Problems:   Chronic kidney disease (CKD), stage IV (severe)   DM (diabetes mellitus), type 2, uncontrolled, with renal complications   Essential hypertension, benign   CAD (coronary artery disease)   Other and unspecified hyperlipidemia   Atrial fibrillation with RVR   Lower extremity edema   Hypoglycemia   Acute renal failure superimposed on stage 4 chronic kidney disease   Acute on chronic combined systolic and diastolic heart failure  Acute exacerbation systolic and diastolic CHF s/p ICD 11/02/107: LE edema greatly improved. Wt increased to 229 and i/o +560 on lasix 40mg  BID. -No ACE-I or ARBs given his renal disease -daily weights, strict i/o's  -ASA 81mg  daily  -continue statin  -continue carvedilol 25mg  BID  -Lasix 40mg  PO BID  Gout flare: Resolving -Norco 5/325mg  Q6hr prn pain -30mg  prednisone daily x 2 days, 20mg  x 1 day then d/c  AKI on CKD, stage IV, history of renal cystic lesion: Baseline Cr 2.1-2.3. On admission his Cr is 2.84. Increased to 2.92  today. -Recommend renal ultrasound in 6-12 months since he cannot obtain an MRI due to his ICD. -Heart failure team consult  Syncope: May be 2/2 cough but will work up for cardiac etiology or volume depletion. EKG today appears unchanged from prior. -Orthostatics -Troponin x 1  Anemia: 2/2 chronic disease with possible Fe deficiency. Smear unremarkable. -ferrous sulfate 325mg  BID -obtain FOBT  Hypoglycemia: No further episodes. Hgb A1c 6.9 -Accuchecks QID -resistant SSI  -hold mealtime novolog -lantus 20u QHS  HTN: normotensive here. On coreg 25mg  BID, lasix 40mg  TID, hydralazine 50mg  TID, Imdur 60mg  daily at home.  -continue hydralazine 50mg  TID  -continue Imdur 60mg  daily  -Lasix as above  -continue Coreg 25mg  BID   Asthma: on dulera 200/5 mcg and albuterol prn at home. -continue albuterol  2 puff Q6hr prn  -continue dulera 200/5 mcg 2 puff BID   A fib: on edoxaban at home. -continue edoxaban   FEN: Heart healthy, carb modified   DVT ppx: SCDs, edoxaban  Dispo: ongoing  The patient does have a current PCP Tanya Nones, MD) and does not need an [facility] hospital follow-up appointment after discharge.  The patient does not have transportation limitations that hinder transportation to clinic appointments.  .Services Needed at time of discharge: Y = Yes, Blank = No PT:   OT:   RN:   Equipment:   Other:     LOS: 4 days   Jacques Earthly, MD 07/20/2014, 9:46 AM

## 2014-07-20 NOTE — Progress Notes (Signed)
Occupational Therapy Treatment Patient Details Name: Tony Freeman MRN: [id number] DOB: October 13, 1951 Today's Date: 07/20/2014    History of present illness Pt admitted with CHF exacerbation and R ankle gout.   OT comments  Pt without pain today and ambulating in room with ease. Pt stood at sink x 5 minutes for grooming before fatigue.  Pt able to perform LB bathing and dressing and simulated tub transfers at a modified independent level.  No further OT needs.  Follow Up Recommendations  No OT follow up;Supervision - Intermittent    Equipment Recommendations  None recommended by OT    Recommendations for Other Services      Precautions / Restrictions         Mobility Bed Mobility Overal bed mobility:  (pt up in chair)                Transfers   Equipment used: None Transfers: Sit to/from Stand Sit to Stand: Modified independent (Device/Increase time)         General transfer comment: Extra time and use of UEs for sit to stand.    Balance                                   ADL Overall ADL's : Modified independent     Grooming: Wash/dry hands;Wash/dry face;Oral care;Standing;Modified independent (stood x 5 minutes before fatiguing and needing to sit)       Lower Body Bathing: Supervison/ safety;Sit to/from stand       Lower Body Dressing: Modified independent;Sit to/from stand   Toilet Transfer: Modified Independent;Regular Toilet;Grab bars;Ambulation           Functional mobility during ADLs: Modified independent (stepped over trash can as simulation of tub) General ADL Comments: no difficulty accessing feet in sitting      Vision                     Perception     Praxis      Cognition   Behavior During Therapy: [facility] for tasks assessed/performed Overall Cognitive Status: Within Functional Limits for tasks assessed                       Extremity/Trunk Assessment               Exercises     Shoulder Instructions       General Comments      Pertinent Vitals/ Pain       Pain Assessment: No/denies pain  Home Living                                          Prior Functioning/Environment              Frequency       Progress Toward Goals  OT Goals(current goals can now be found in the care plan section)  Progress towards OT goals: Goals met/education completed, patient discharged from OT  Acute Rehab OT Goals Patient Stated Goal: home with family  Plan Discharge plan remains appropriate    Co-evaluation                 End of Session     Activity Tolerance Patient limited by fatigue   Patient Left in chair;with call bell/phone within  reach;with family/visitor present (PT)   Nurse Communication          Time: 5697-9480 OT Time Calculation (min): 31 min  Charges: OT General Charges $OT Visit: 1 Procedure OT Treatments $Self Care/Home Management : 23-37 mins  Malka So 07/20/2014, 10:34 AM [phone]

## 2014-07-20 NOTE — Progress Notes (Signed)
Pharmacist Heart Failure Core Measure Documentation  Assessment: Tony Freeman has an EF documented as 20-25% on 04/02/2014 by 2DE[employer].  Rationale: Heart failure patients with left ventricular systolic dysfunction (LVSD) and an EF < 40% should be prescribed an angiotensin converting enzyme inhibitor (ACEI) or angiotensin receptor blocker (ARB) at discharge unless a contraindication is documented in the medical record.  This patient is not currently on an ACEI or ARB for HF.  This note is being placed in the record in order to provide documentation that a contraindication to the use of these agents is present for this encounter.  ACE Inhibitor or Angiotensin Receptor Blocker is contraindicated (specify all that apply)  []   ACEI allergy AND ARB allergy []   Angioedema []   Moderate or severe aortic stenosis []   Hyperkalemia []   Hypotension []   Renal artery stenosis [x]   Worsening renal function, preexisting renal disease or dysfunction  Pt. With AKI on CKD, stage IV, scr 2.92, on Imdur, hydralazine, coreg.    Manley Mason 07/20/2014 3:04 PM

## 2014-07-20 NOTE — Evaluation (Signed)
Physical Therapy Evaluation Patient Details Name: Tony Freeman MRN: [id number] DOB: 11/15/1950 Today's Date: 07/20/2014   History of Present Illness  Pt admitted with CHF exacerbation and R ankle gout.  Clinical Impression  Pt admitted with the above. Pt currently with functional limitations due to the deficits listed below (see PT Problem List). At the time of PT eval pt was able to perform mobility and ambulation with no physical assist and no AD.  Pt will benefit from skilled PT to increase their independence and safety with mobility to allow discharge to the venue listed below.  During stair training, pt required increased cues for general safety awareness and energy conservation. Pt was not making corrective changes and was pushing himself to achieve the 3 flights of steps he has to enter his apartment. He became very SOB and therapist insisted he take standing rest breaks with pursed-lip breathing to recover. During this time sats were 96% and HR at 107. Upon return to the room pt was seated in recliner chair and began straining to cough. He then slumped over to the left and became unresponsive for ~10 seconds. RN was called in to assess vitals, which were BP 136/77, HR 92, oxygen saturation 95% on room air. Pt was A/Ox4 at end of session.       Follow Up Recommendations Other (comment) (Outpatient cardiac rehab)    Equipment Recommendations  None recommended by PT    Recommendations for Other Services       Precautions / Restrictions Precautions Precautions: Fall Restrictions Weight Bearing Restrictions: No      Mobility  Bed Mobility Overal bed mobility:  (pt up in chair)             General bed mobility comments: Pt seated in chair upon PT arrival  Transfers Overall transfer level: Modified independent Equipment used: None Transfers: Sit to/from Stand Sit to Stand: Modified independent (Device/Increase time)         General transfer comment: Pt  demonstrated proper hand placement and safety awareness.  Ambulation/Gait Ambulation/Gait assistance: Supervision Ambulation Distance (Feet): 50 Feet Assistive device: None Gait Pattern/deviations: Step-through pattern;Decreased stride length;Trendelenburg Gait velocity: Decreased Gait velocity interpretation: Below normal speed for age/gender General Gait Details: Pt cued for pursed-lip breathing and encouraged pt to take standing rest breaks when ambulating back to the room after stair training. Pt declined rest breaks.   Stairs Stairs: Yes Stairs assistance: Min guard Stair Management: Alternating pattern;One rail Left;Step to pattern;Forwards Number of Stairs: 30 (3x/10 steps) General stair comments: Pt with 3 flights of stairs to enter apartment. Pt wanted to practice all 3 flights despite cues from PT to slow down and focus on technique rather than quantity of steps. Pt very SOB at end of stair training and required max cueing for energy conservation and safety. No physical assist required.  Wheelchair Mobility    Modified Rankin (Stroke Patients Only)       Balance Overall balance assessment: Needs assistance Sitting-balance support: No upper extremity supported;Feet supported Sitting balance-Leahy Scale: Good     Standing balance support: No upper extremity supported;During functional activity Standing balance-Leahy Scale: Fair                               Pertinent Vitals/Pain Pain Assessment: No/denies pain    Home Living Family/patient expects to be discharged to:: Private residence Living Arrangements: Children Available Help at Discharge: Family;Available 24 hours/day Type of Home:  Apartment Home Access: Stairs to enter Entrance Stairs-Rails: Right Entrance Stairs-Number of Steps: 3 flights Home Layout: One level Home Equipment: Cane - single point;Shower seat      Prior Function Level of Independence: Independent                Hand Dominance   Dominant Hand: Right    Extremity/Trunk Assessment   Upper Extremity Assessment: Defer to OT evaluation           Lower Extremity Assessment: Overall WFL for tasks assessed      Cervical / Trunk Assessment: Normal  Communication   Communication: No difficulties  Cognition Arousal/Alertness: Awake/alert Behavior During Therapy: WFL for tasks assessed/performed Overall Cognitive Status: Within Functional Limits for tasks assessed                      General Comments      Exercises        Assessment/Plan    PT Assessment Patient needs continued PT services  PT Diagnosis Difficulty walking;Generalized weakness   PT Problem List Decreased strength;Decreased range of motion;Decreased activity tolerance;Decreased balance;Decreased mobility;Decreased knowledge of use of DME;Decreased safety awareness;Decreased knowledge of precautions;Cardiopulmonary status limiting activity  PT Treatment Interventions DME instruction;Gait training;Stair training;Functional mobility training;Therapeutic activities;Therapeutic exercise;Neuromuscular re-education;Patient/family education   PT Goals (Current goals can be found in the Care Plan section) Acute Rehab PT Goals Patient Stated Goal: home with family PT Goal Formulation: With patient Time For Goal Achievement: 07/27/14 Potential to Achieve Goals: Good    Frequency Min 3X/week   Barriers to discharge Inaccessible home environment Pt lives on 3rd floor apartment. To move to 1st floor apartment this week (possibly tomorrow)    Co-evaluation               End of Session Equipment Utilized During Treatment: Gait belt Activity Tolerance: Treatment limited secondary to medical complications (Comment) Patient left: in chair;with call bell/phone within reach Nurse Communication: Mobility status;Other (comment) (Episode of passing out)         Time: 7353-2992 PT Time Calculation (min): 27  min   Charges:   PT Evaluation $Initial PT Evaluation Tier I: 1 Procedure PT Treatments $Gait Training: 23-37 mins   PT G Codes:          Jolyn Lent 07/20/2014, 1:08 PM  Jolyn Lent, PT, DPT Acute Rehabilitation Services Pager: [phone]

## 2014-07-20 NOTE — Progress Notes (Signed)
Called into patient's room by physical therapy Mickel Baas).  She said that she worked with patient for about 10 minutes in the stairwell going up and down stairs.  When he got back to his room and sat in the chair, he started coughing and then he fell back in the chair and his eyes rolled back.  Mickel Baas stated he was passed out for about 10 seconds and then came around.  BP 136/77, HR 92, oxygen saturation 95% on room air.  Patient now alert and oriented x4. Neuro checks within normal limits.  Dr. Curly Rim and made aware and stated she would come see the patient.  Will continue to monitor.

## 2014-07-20 NOTE — Consult Note (Signed)
Cardiology Consultation Note  Patient ID: Tony Freeman, MRN: [id number], DOB/AGE: [birth year] 61 y.o. Admit date: 07/16/2014   Date of Consult: 07/20/2014 Primary Physician: Tony Nones, MD Primary Cardiologist: [facility] - Dr. Kreg Freeman. Tony Freeman (ICD)  Chief Complaint: SOB/LEE Reason for Consult: syncope, HF  HPI: Tony Freeman is a 63 year old man with history of atrial fibrillation now on edoxaban, CAD (s/p multiple stents) chronic combined CHF (echo 04/02/2014 with LV EF: 25-30%, diffuse hypokinesis), ischemic cardiomyopathy s/p dual chamber ICD placement 06/29/2014, DM2, CKD stage IV, asthma, HLD, abnormal renal US 03/2014 (followed by urology) whom we are asked to see for syncope and CHF.  It is somewhat difficult to follow his medical history given multiple sites of care. The patient has h/o PCI LAD/RCA in 2007, PCI LAD/RCA in 2009 at [facility] [place], abnormal lexiscan stress test 06/2013 moderate reversible perfusion abnormality in anterior, septal and apical region, then PCI to LAD 08/2013. [facility] records indicate that coronary angio at that time also showed CTO of RCA and nonobstructive disease in the LCx. Echo at [place] at least back to 01/2013 showed EF 35-40%, then <25% in 0/9735 with diastolic dysfunction and elevated RVSP. During his 03/2014 admission at [facility], he was diagnosed with new onset atrial fib-vs-flutter. Echo showed false tendon in LV apex, moderate LVH, EF 25-30%, diffuse HK, mod LAE, trivial TR but no mention of pulm HTN. He also had one troponin that was 0.11 and the rest were negative, felt due to CKD and intracranial etiology with some encephalopathy. He was discharged on Coumadin (along with his Plavix that he was on from prior PCI/stroke). However, it's not totally clear what he did with the Coumadin as it was no longer on his list when he saw his primary cardiologist in the [place] system at the end of June. The patient thinks his cardiologist took him off it but the  note doesn't reflect that. (The patient does have some cognitive difficulties since his stroke and lives with his son who manages his medicine.) [facility] between June/July/August indicate he had been in NSR. He went on to have ICD placement on 9/3. Reading their notes, it appears they were not aware of his 03/2014 diagnosis of atrial fibrillation until it was picked up on his ICD interrogation in early September at which time he was placed on Edoxaban. [employer] interrogation confirms AF since ICD implant 06/29/14. The plan was to consider cardioversion in several weeks.  He was admitted here on 07/16/14 with increasing LE edema and SOB x 1 week, along with 12 lb weight gain (218-230). He had been seen by PCP who increased Lasix from BID to TID but edema continued to worsen. He also had dry cough and orthopnea. He denied any fevers, CP, nausea, vomiting, diarrhea, overt bleeding, near-syncope or syncope prior to today's event. With diuresis weight went from 230-> low of 225 on 9/22 but up to 229 today. Cr 2.84->2.59->2.77->2.92. He has slightly worsened anemia from June (~11 -> ~9). Clinically he does feel slightly better from a CHF standpoint. This morning, PT was working with the patient for about 10 minutes in the stairwell going up and down stairs. Per PT notes "He became very SOB and therapist insisted he take standing rest breaks with pursed-lip breathing to recover. During this time sats were 96% and HR at 107. Upon return to the room pt was seated in recliner chair and began straining to cough. He then slumped over to the left and became unresponsive for ~10  seconds. RN was called in to assess vitals, which were BP 136/77, HR 92, oxygen saturation 95% on room air. Pt was A/Ox4 at end of session." The patient denies that he fully lost consciousness and says he just felt very dazed like he was losing touch with blurred vision. He denies any prior syncope. There was no reported seizure  activity or incontinence. No chest pain. Telemetry was stable at that time without any significant arrhythmia or bradyarrthythmia, only continued coarse atrial fib. Rates remain slightly above 100 AF.  Other pertinent findings during this admission were his continued abnormal renal US, which comments that one cyst may be complicated by hemorrhage. Orthostatics: 123/81 lying -> 122/84 sitting-> 123/84 standing with HR from 54 to 83 (not sure I believe HR of 54 since HR has generally been around the 80s-120s). He is on prednisone for gout flare. Recent baseline Cr's have settled out around 2.1.   Past Medical History  Diagnosis Date  . Diabetes mellitus with nephropathy   . Systolic and diastolic CHF, chronic     a. Echo at [facility] 11/17/2013 Ef <25%, severe hypokinesis of LV, moderately dilated LA, grade IV/IV diastolic dysfunction with irreversible restrictive pattern of mitral inflow, severe pulm HTN (RVSP >32mmHg), 1-2+ MR  b. Echo at [facility] 04/02/14 EF 25-30%, moderate concentric LVH, diffuse hypokinesis, moderately dilated LA, no significant MR observed  . CAD (coronary artery disease)     a. PCI LAD/RCA in 2007  b. PCI LAD/RCA in 2009 at [facility]  c. PCI LAD 08/2013, CTO of RCA and nonobstructive disease in the LCx.  Marland Kitchen Hyperlipidemia   . Stroke   . Dementia   . Ischemic cardiomyopathy     a. s/p Biotronik ICD 06/2014.  Marland Kitchen Retinopathy     bilateral  . CKD (chronic kidney disease), stage IV   . HTN (hypertension)   . Abnormal renal ultrasound     a. 03/2014: Abnormal renal ultrasound with left renal lesion and bladder wall thickening and microscopic hematuria. Multiple cysts on prior MRI. Given inability to use contrast, f/u imaging limited, repeat US 3 months with outpatient uro f/u.  Marland Kitchen Paroxysmal atrial fibrillation   . Paroxysmal atrial flutter       Most Recent Cardiac Studies: 2D Echo 03/2014 - Left ventricle: False tendon in LV apex of no clinical significance. The cavity size was  normal. There was moderate concentric hypertrophy. Systolic function was severely reduced. The estimated ejection fraction was in the range of 25% to 30%. Diffuse hypokinesis. - Aortic valve: Trileaflet; mildly thickened leaflets. - Left atrium: The atrium was moderately dilated. - Tricuspid valve: There was trivial regurgitation.  Cardiac Cath 08/2013 Procedure:1. Coronary angiography 2. Drug-eluting stent to the proximal LAD. 3. Balloon angioplasty to the distal LAD Indications: None STEMI Medications during procedure: Fentanyl, Versed, (antiplatelet PLAVIX) anticoagulant ANGIOMAX, intracoronary nitroglycerin Procedure: After informed and written consent, patient was brought tot he cardiac cath lab at [facility] center in fasting state. Patient was prepped and draped in normal sterile fashion. Right radial area was anesthestized with 2% lidocaine solution, right radial artery was accessed with 6 F sheath according to modified seldinger technique. Sheath was flushed and intra-radial artery verapamil was given to prevent spasm. Then a 33F tiger catheter inserted in left and right coronary artery under fluoroscopic guidance. Multiple views of left and right coronary arteries were obtained.  Catheter removed, sheath exchanged with 44F sheath and then a 6 [place] JL4 Guide catheter inserted in the left coronary  system.  A run-through wire inserted distal to the lesion in the proximal LAD and then a 2.5x15 mm trek Balloon inserted to the lesion and inflated at 12 atmospheres and then a 2.0 x 10 mm flextome cutting balloon inserted distal in the LAD and inflated up to 10  atmospheres. Then 2.75 x 20 mm premier drug-eluting stent inserted to the lesion in the proximal LAD and deployed at 14 atmp. Stent balloon was used to further dilate the pre-existing stents up to 18 atmosphere. The stent balloon was then removed and  then a 2.0 x 8 mm trek balloon inserted distal in the LAD and inflated  sequentially up to 20 atmospheres. Equipment removed Multiple views were obtained with good angiographic results and no immediate complications. Sheath removed, TR band applied with good hemostasis.  Patient returned to holding area in stable condition. Findings: 1. Hemodynamics: Aortic pressure 142/69 2. Coronary system: Left Main: Large caliber vessel with no angiographic evidence of stenosis. LAD system: Large caliber vessel, wraps around the apex. 99% proximal stenosis, 90% distal in-stent restenosis LCX system: Large caliber vessel. 20-30% proximal stenosis. RCA system: right dominant. 100% proximal stenosis with left to right collaterals. Conclusion: 1. Successful intervention to the proximal LAD artery using 2.75 x 20 mm premier drug-eluting stent. Lesion description: None type C. Eccentric calcified, lesion length 16 mm, preintervention stenosis 99% postintervention stenosis 0% Recommendations: Dual anti-platelet therapy minimum 1 year. High intensity statin therapy. Continue risk factor modifications.    Surgical History:  Past Surgical History  Procedure Laterality Date  . Coronary stent placement      Multivessel coronary intervention in 2007 and 2009. Ischemic cardiomyopathyPCI LAD/ RCA in 2007 by Dr. Dwyane Dee. PCI LAD/RCA in 2009 at [facility]; PCI LAD 08/2013. Presented with unstable angina pectoris.. Coronary angio demonstrated proximal and distal LAD lesions.76/20 PROMUS stent in proximal LAD// PTCA of distal LAD  . Cardiac defibrillator placement    . Spine surgery      anterior fusion  . Hernia repair       Home Meds: Prior to Admission medications   Medication Sig Start Date End Date Taking? Authorizing Provider  albuterol (PROVENTIL HFA;VENTOLIN HFA) 108 (90 BASE) MCG/ACT inhaler Inhale 2 puffs into the lungs every 6 (six) hours as needed for wheezing or shortness of breath.   Yes Historical Provider, MD  aspirin 81 MG chewable tablet Chew 81 mg by mouth  daily.   Yes Historical Provider, MD  atorvastatin (LIPITOR) 40 MG tablet Take 1 tablet (40 mg total) by mouth daily. 04/08/14  Yes Geradine Girt, DO  carvedilol (COREG) 25 MG tablet Take 25 mg by mouth 2 (two) times daily. 07/12/14  Yes Historical Provider, MD  cholecalciferol (VITAMIN D) 1000 UNITS tablet Take 2,000 Units by mouth daily.   Yes Historical Provider, MD  edoxaban (SAVAYSA) 30 MG TABS tablet Take 30 mg by mouth daily. 07/07/14  Yes Historical Provider, MD  furosemide (LASIX) 40 MG tablet Take 40 mg by mouth 2 (two) times daily.   Yes Historical Provider, MD  hydrALAZINE (APRESOLINE) 50 MG tablet Take 50 mg by mouth 3 (three) times daily. 06/23/14 06/23/15 Yes Historical Provider, MD  insulin aspart (NOVOLOG) 100 UNIT/ML injection Inject 10 Units into the skin 3 (three) times daily with meals. 04/08/14  Yes Jessica U Vann, DO  insulin glargine (LANTUS) 100 UNIT/ML injection Inject 0.45 mLs (45 Units total) into the skin at bedtime. 04/08/14  Yes Geradine Girt, DO  isosorbide mononitrate (IMDUR) 60  MG 24 hr tablet Take 60 mg by mouth daily. 06/23/14 06/23/15 Yes Historical Provider, MD  mometasone-formoterol (DULERA) 200-5 MCG/ACT AERO Inhale 2 puffs into the lungs 2 (two) times daily.   Yes Historical Provider, MD  Multiple Vitamin (MULTIVITAMIN WITH MINERALS) TABS tablet Take 1 tablet by mouth daily.   Yes Historical Provider, MD  predniSONE (DELTASONE) 10 MG tablet Take 3 tablets (30 mg total) by mouth daily with breakfast. 07/19/14   Jacques Earthly, MD    Inpatient Medications:  . aspirin  81 mg Oral Daily  . atorvastatin  40 mg Oral Daily  . carvedilol  25 mg Oral BID WC  . cholecalciferol  2,000 Units Oral Daily  . edoxaban  30 mg Oral Daily  . ferrous sulfate  325 mg Oral BID WC  . furosemide  40 mg Oral BID  . hydrALAZINE  50 mg Oral TID  . insulin aspart  0-20 Units Subcutaneous TID WC  . insulin glargine  20 Units Subcutaneous QHS  . isosorbide mononitrate  60 mg Oral Daily    . mometasone-formoterol  2 puff Inhalation BID  . multivitamin with minerals  1 tablet Oral Daily  . predniSONE  30 mg Oral Q breakfast  . sodium chloride  3 mL Intravenous Q12H      Allergies: No Known Allergies  History   Social History  . Marital Status: Unknown    Spouse Name: N/A    Number of Children: N/A  . Years of Education: N/A   Occupational History  . Not on file.   Social History Main Topics  . Smoking status: Never Smoker   . Smokeless tobacco: Not on file  . Alcohol Use: Not on file  . Drug Use: No  . Sexual Activity: Not on file   Other Topics Concern  . Not on file   Social History Narrative   Going to be living with his son.  He lives with his daughter currently.  2 children.  Disability since CVA     Family History  Problem Relation Age of Onset  . Heart disease      brother  . Diabetes Mother   . Cancer Father     brother  . Kidney disease       Review of Systems: General: negative for chills, fever Cardiovascular: see above Dermatological: negative for rash Respiratory: + cough Abdominal: negative for nausea, vomiting, diarrhea, bright red blood per rectum, melena, or hematemesis Neurologic: see above All other systems reviewed and are otherwise negative except as noted above.  Labs:  Recent Labs  07/20/14 0945  TROPONINI <0.30   Lab Results  Component Value Date   WBC 8.5 07/19/2014   HGB 9.2* 07/19/2014   HCT 29.7* 07/19/2014   MCV 92.5 07/19/2014   PLT 168 07/19/2014    Recent Labs Lab 07/17/14 0350  07/20/14 0945  NA 142  < > 133*  K 3.9  < > 4.5  CL 103  < > 93*  CO2 27  < > 26  BUN 50*  < > 62*  CREATININE 2.59*  < > 2.92*  CALCIUM 9.1  < > 9.3  PROT 6.6  --   --   BILITOT 1.4*  --   --   ALKPHOS 108  --   --   ALT 16  --   --   AST 28  --   --   GLUCOSE 139*  < > 381*  < > = values in this  interval not displayed. Lab Results  Component Value Date   CHOL 77 07/17/2014   HDL 27* 07/17/2014   LDLCALC 40  07/17/2014   TRIG 51 07/17/2014   No results found for this basename: DDIMER    Radiology/Studies:  Dg Chest 2 View 07/16/2014   CLINICAL DATA:  Short of breath.  Bilateral feet edema.  EXAM: CHEST  2 VIEW  COMPARISON:  04/03/2014.  FINDINGS: Cardiac silhouette is mildly enlarged. There is a left coronary artery stent. Normal mediastinal and hilar contours. Left anterior chest wall sequential pacemaker is well positioned, new from the prior exam.  Clear lungs.  No pleural effusion or pneumothorax.  Bony thorax is demineralized but intact.  IMPRESSION: No acute cardiopulmonary disease.   Electronically Signed   By: Lajean Manes M.D.   On: 07/16/2014 16:14   Dg Ankle Complete Right 07/18/2014   CLINICAL DATA:  Right ankle pain and swelling.  EXAM: RIGHT ANKLE - COMPLETE 3+ VIEW  COMPARISON:  None.  FINDINGS: No evidence of acute fracture or dislocation. Ankle mortise intact with well preserved joint space. No visible joint effusion. Ossification in the distal interosseous membrane. Atherosclerotic calcification of the anterior and posterior tibial arteries and the plantar artery and dorsalis pedis artery in the foot.  IMPRESSION: No acute osseous abnormality. Ossification in the distal interosseous membrane consistent with a remote injury.   Electronically Signed   By: Evangeline Dakin M.D.   On: 07/18/2014 13:50   US Renal 07/17/2014   CLINICAL DATA:  Follow-up renal mass, chronic kidney disease  EXAM: RENAL/URINARY TRACT ULTRASOUND COMPLETE  COMPARISON:  05/23/2014 and 04/04/2014  FINDINGS: Right Kidney:  Length: 8.8 cm. Echogenic renal parenchyma, suggesting medical renal disease. 3.5 x 2.9 x 3.2 cm right upper pole renal cyst, possibly mildly complicated by hemorrhage on the current ultrasound, although simple on the initial prior. Additional simple 2.8 x 2.6 x 2.3 cm interpolar cyst. No hydronephrosis.  Left Kidney:  Length: 10.1 cm. Echogenic renal parenchyma, suggesting recurrent disease. 2.2 x 1.8 x  1.7 cm mildly complex/septated left upper pole renal cyst. No hydronephrosis.  Bladder:  Within normal limits.  IMPRESSION: 2.2 cm mildly complex/septated left upper pole renal cyst. Consider MR (without contrast given the patient's GFR) for further characterization.  Two right renal cysts measuring up to 3.5 cm, one of which may be complicated by hemorrhage on the current study, although both appeared simple on prior studies.  Echogenic renal parenchyma, suggesting medical renal disease.   Electronically Signed   By: Julian Hy M.D.   On: 07/17/2014 22:39   EKG:  07/16/14: coarse atrial fib (vs flutter) 78bpm, incomplete LBBB, baseline wander present with flattening in I, avL, avF, TWI V5-V6 07/17/14: coarse atrial fib (vs flutter) 85bpm, incomplete LBBB, generalized TW flattening I, II, III, avF, avL, TWI V5-V6 07/20/14: coarse atrial fib (vs flutter), incomplete LBBB, generalized TW flattening in I, II, III, avF, avL and TWI V5-V6 Overall not significantly changed from prior tracing except more coarse atrial waves  Physical Exam: Blood pressure 123/84, pulse 83, temperature 98.3 F (36.8 C), temperature source Oral, resp. rate 18, height 5\' 6"  (1.676 m), weight 229 lb 1.6 oz (103.919 kg), SpO2 95.00%. General: Well developed, well nourished obese AAM, in no acute distress. Body mass index is 37 kg/(m^2). Head: Normocephalic, atraumatic, sclera non-icteric, no xanthomas, nares are without discharge.  Neck: Negative for carotid bruits. JVD mildly elevated. Lungs: Diffuse quiet end expiratory wheezing. No rales or rhonchi. Breathing is unlabored.  Heart: Irregular, mildly tachycardic with S1 S2. No murmurs, rubs, or gallops appreciated. Abdomen: Soft, non-tender, non-distended with normoactive bowel sounds. No hepatomegaly. No rebound/guarding. No obvious abdominal masses. Msk:  Strength and tone appear normal for age. Extremities: No clubbing or cyanosis. Tr-1+ BLE edema.  Distal pedal pulses  are 2+ and equal bilaterally. Neuro: Alert and oriented X 3. No facial asymmetry. No focal deficit. Moves all extremities spontaneously. Psych:  Responds to questions appropriately with a normal affect but somewhat slowed response time.   Assessment and Plan:   1. Acute on chronic combined CHF with cardiorenal syndrome - see comprehensive thoughts below. We are changing him to Lasix 80mg  IV BID and starting milrinone. Dry weight ~212lb. Unfortunately he does not presently have IV access. We have asked IV team to attempt this. If unsuccessful, he will need a central line as we cannot use PICC given advanced CKD. Dr. Haroldine Laws may be able to come back after a talk later this evening out of town to perform but placement of a central line, but it may be more expeditious to ask CCM to perform if necessary. 2. Paroxysmal atrial fibrillation - diagnosed here in 03/2014, then outpatient [place] notes indicate he was in NSR (their team had been unaware of AF diagnosis). He was discharged on Coumadin as of 03/2014 but unclear what he actually did with this rx. Per [employer] he's been in AF since ICD implant on 06/29/14. Edoxaban started by [facility] cardiology 07/07/14 - will continue this. Suspect his CHF is aggravated by AF. Once tuned up, we would consider DCCV +/- TEE (will need to clarify with son that he has in fact been compliant with edoxaban). We do feel he needs antiarrhythmic therapy given paroxysms thus have decided to start amiodarone. Will update thyroid function and consider baseline PFTs once more compensated. 3. Possible syncope - patient denies full LOC but felt very dazed at time of event. Suspected due to coughing, possibly a vagal/hypoxic-type event. No events on telemetry. Monitor for recurrence. 4. Acute on CKD stage IV - suspected cardiorenal syndrome. Watch Cr with Lasix/milrinone. 5. Renal lesions by [place] with possible complication of hemorrhage per study 9/21 - see below. 6.  Progressive anemia - per internal medicine - does the possible renal cyst hemorrhage require further urology evaluation while inpatient? 7. CAD s/p PCI - continue statin, BB. D/c aspirin while on apixaban given progressive anemia and renal issue as above. No recent CP.  I do think it would be helpful at discharge to make certain that his primary cardiologist gets a copy of this consult as well as final discharge summary to support continuity of care.  Signed, Melina Copa PA-C 07/20/2014, 4:16 PM   Patient seen and examined with Melina Copa, PA-C. We discussed all aspects of the encounter. I agree with the assessment and plan as stated above.   Complicated case. I suspect he has low output HF and cardiorenal syndrome due to recurrent AF. He remains volume overloaded. Creatinine increasing again. Will start milrinone. Continue lasix 80 IV bid. If not improving tomorrow, we will transfer to SDU and place central access to follow CVPs and co-ox. Once tuned up will need cardioversion. We will need to confirm compliance with edoxaban through family. If not compliant for 4 weeks will need TEE. Start amio 400 bid. Prior to cardioversion we will need to know if renal cyst is stable and will not require stopping anticoagulation.   I spoke with [employer] rep and AF has been  constant since ICD implant on 06/29/14.   HF team will follow.  Benay Spice 4:27 PM

## 2014-07-20 NOTE — Progress Notes (Signed)
Dr. Haroldine Laws called RN to ask if pt milrinone drip started. Notified that drip started and patient received 80mg  IV lasix at 18:37 with no output recorded yet. No new orders at this time.

## 2014-07-20 NOTE — Progress Notes (Signed)
  Date: 07/20/2014  Patient name: Tony Freeman  Medical record number: [id number]  Date of birth: Jul 09, 1951   This patient has been seen and the plan of care was discussed with the house staff. Please see their note for complete details. I concur with their findings with the following additions/corrections:  Plan was for patient to return home today, however, after PT patient had a coughing spell and syncope.  Will initiate work up for syncope with orthostatic vital signs, EKG (telemetry unchanged) and CE X 1.  Likely related to coughing.   Sid Falcon, MD 07/20/2014, 1:11 PM

## 2014-07-21 DIAGNOSIS — I5041 Acute combined systolic (congestive) and diastolic (congestive) heart failure: Secondary | ICD-10-CM

## 2014-07-21 DIAGNOSIS — E162 Hypoglycemia, unspecified: Secondary | ICD-10-CM

## 2014-07-21 DIAGNOSIS — I10 Essential (primary) hypertension: Secondary | ICD-10-CM

## 2014-07-21 LAB — BASIC METABOLIC PANEL
ANION GAP: 15 (ref 5–15)
BUN: 72 mg/dL — ABNORMAL HIGH (ref 6–23)
CO2: 26 mEq/L (ref 19–32)
CREATININE: 3.08 mg/dL — AB (ref 0.50–1.35)
Calcium: 9.4 mg/dL (ref 8.4–10.5)
Chloride: 94 mEq/L — ABNORMAL LOW (ref 96–112)
GFR calc Af Amer: 24 mL/min — ABNORMAL LOW (ref >90)
GFR, EST NON AFRICAN AMERICAN: 20 mL/min — AB (ref >90)
Glucose, Bld: 248 mg/dL — ABNORMAL HIGH (ref 70–99)
Potassium: 4.1 mEq/L (ref 3.7–5.3)
SODIUM: 135 meq/L — AB (ref 137–147)

## 2014-07-21 LAB — T4, FREE: Free T4: 1.44 ng/dL (ref 0.80–1.80)

## 2014-07-21 LAB — GLUCOSE, CAPILLARY
GLUCOSE-CAPILLARY: 285 mg/dL — AB (ref 70–99)
GLUCOSE-CAPILLARY: 236 mg/dL — AB (ref 70–99)
GLUCOSE-CAPILLARY: 397 mg/dL — AB (ref 70–99)
Glucose-Capillary: 319 mg/dL — ABNORMAL HIGH (ref 70–99)

## 2014-07-21 LAB — TSH: TSH: 0.413 u[IU]/mL (ref 0.350–4.500)

## 2014-07-21 MED ORDER — PREDNISONE 20 MG PO TABS: 20.0000 mg | ORAL_TABLET | Freq: Every day | ORAL | Status: DC

## 2014-07-21 MED ORDER — PREDNISONE 20 MG PO TABS
20.0000 mg | ORAL_TABLET | Freq: Every day | ORAL | Status: AC
  Administered 2014-07-23: 20 mg via ORAL
  Filled 2014-07-21: qty 1

## 2014-07-21 MED ORDER — INSULIN ASPART 100 UNIT/ML ~~LOC~~ SOLN
0.0000 [IU] | Freq: Every day | SUBCUTANEOUS | Status: DC
  Administered 2014-07-21: 5 [IU] via SUBCUTANEOUS
  Administered 2014-07-22 – 2014-07-25 (×3): 3 [IU] via SUBCUTANEOUS

## 2014-07-21 MED ORDER — INSULIN ASPART 100 UNIT/ML ~~LOC~~ SOLN
6.0000 [IU] | Freq: Three times a day (TID) | SUBCUTANEOUS | Status: DC
  Administered 2014-07-21 – 2014-07-23 (×8): 6 [IU] via SUBCUTANEOUS

## 2014-07-21 MED ORDER — INSULIN GLARGINE 100 UNIT/ML ~~LOC~~ SOLN
30.0000 [IU] | Freq: Every day | SUBCUTANEOUS | Status: DC
  Administered 2014-07-21 – 2014-07-28 (×8): 30 [IU] via SUBCUTANEOUS
  Filled 2014-07-21 (×9): qty 0.3

## 2014-07-21 NOTE — Progress Notes (Signed)
Subjective: [name] reports feeling better today. He denies chest pain or shortness of breath. Per family he reported to them that he had a couple more episodes of syncope. His ankle pain is better. Denies nausea/vomiting.  Objective: Vital signs in last 24 hours: Filed Vitals:   07/21/14 0754 07/21/14 0910 07/21/14 1426 07/21/14 1619  BP: 117/66  131/65 108/63  Pulse: 77  93 92  Temp:   98 F (36.7 C)   TempSrc:   Oral   Resp:   20   Height:      Weight:      SpO2:  96% 94%    Weight change: -1 lb 1.3 oz (-0.489 kg)  Intake/Output Summary (Last 24 hours) at 07/21/14 1644 Last data filed at 07/21/14 1425  Gross per 24 hour  Intake 732.48 ml  Output   1000 ml  Net -267.52 ml   General Apperance: NAD  Head: Normocephalic, atraumatic  Eyes: PERRL, EOMI, anicteric sclera  Ears: Normal external ear canal  Nose: Nares normal, septum midline, mucosa normal  Throat: Lips, mucosa and tongue normal  Neck: Supple, trachea midline  Back: No tenderness or bony abnormality  Lungs: Clear to auscultation bilaterally. No wheezes No rhonchi or rales. Breathing comfortably  Chest Wall: Nontender, no deformity  Heart: Irregularly irregular, no murmur/rub/gallop  Abdomen: Soft, nontender, nondistended, no rebound/guarding  Extremities: Normal, atraumatic, warm and well perfused, 1+ pitting edema to BLE to lower midshin. Nontender to palpation R medial and lateral ankle. Pulses: 2+ throughout  Skin: No rashes or lesions  Neurologic: Alert and oriented x 3. CNII-XII intact. Normal strength and sensation   Lab Results: Basic Metabolic Panel:  Recent Labs Lab 07/17/14 0350  07/19/14 0915 07/20/14 0945 07/21/14 0950  NA 142  < > 140 133* 135*  K 3.9  < > 4.0 4.5 4.1  CL 103  < > 99 93* 94*  CO2 27  < > 29 26 26   GLUCOSE 139*  < > 188* 381* 248*  BUN 50*  < > 52* 62* 72*  CREATININE 2.59*  < > 2.77* 2.92* 3.08*  CALCIUM 9.1  < > 9.4 9.3 9.4  MG 2.2  --  2.1  --   --   < >  = values in this interval not displayed. Liver Function Tests:  Recent Labs Lab 07/17/14 0350  AST 28  ALT 16  ALKPHOS 108  BILITOT 1.4*  PROT 6.6  ALBUMIN 3.0*   CBC:  Recent Labs Lab 07/16/14 1401 07/17/14 0350 07/18/14 1259 07/19/14 0915  WBC 8.8 6.9 8.3 8.5  NEUTROABS  --  4.9  --   --   HGB 9.1* 9.0* 9.3* 9.2*  HCT 28.4* 28.6* 29.4* 29.7*  MCV 91.6 92.0 93.0 92.5  PLT 194 180 187 168   Cardiac Enzymes:  Recent Labs Lab 07/17/14 0350 07/17/14 1108 07/20/14 0945  TROPONINI <0.30 <0.30 <0.30   BNP:  Recent Labs Lab 07/16/14 1401  PROBNP 12348.0*   CBG:  Recent Labs Lab 07/20/14 1133 07/20/14 1701 07/20/14 2052 07/21/14 0608 07/21/14 1118 07/21/14 1626  GLUCAP 334* 275* 295* 285* 236* 319*   Hemoglobin A1C:  Recent Labs Lab 07/16/14 2314  HGBA1C 6.9*    Fasting Lipid Panel:  Recent Labs Lab 07/17/14 0350  CHOL 77  HDL 27*  LDLCALC 40  TRIG 51  CHOLHDL 2.9   Urinalysis:  Recent Labs Lab 07/16/14 1600  COLORURINE YELLOW  LABSPEC 1.012  PHURINE 6.0  GLUCOSEU NEGATIVE  HGBUR NEGATIVE  BILIRUBINUR NEGATIVE  KETONESUR NEGATIVE  PROTEINUR 100*  UROBILINOGEN 1.0  NITRITE NEGATIVE  LEUKOCYTESUR NEGATIVE   Studies/Results: No results found. Medications: I have reviewed the patient's current medications. Scheduled Meds: . amiodarone  400 mg Oral BID  . atorvastatin  40 mg Oral Daily  . carvedilol  25 mg Oral BID WC  . cholecalciferol  2,000 Units Oral Daily  . edoxaban  30 mg Oral Daily  . ferrous sulfate  325 mg Oral BID WC  . hydrALAZINE  50 mg Oral TID  . insulin aspart  0-20 Units Subcutaneous TID WC  . insulin aspart  6 Units Subcutaneous TID WC  . insulin glargine  30 Units Subcutaneous QHS  . isosorbide mononitrate  60 mg Oral Daily  . mometasone-formoterol  2 puff Inhalation BID  . multivitamin with minerals  1 tablet Oral Daily  . predniSONE  30 mg Oral Q breakfast  . sodium chloride  3 mL Intravenous Q12H    Continuous Infusions: . milrinone 0.25 mcg/kg/min (07/21/14 1439)   PRN Meds:.acetaminophen, albuterol, HYDROcodone-acetaminophen, ondansetron (ZOFRAN) IV, ramelteon Assessment/Plan: Active Problems:   Chronic kidney disease (CKD), stage IV (severe)   DM (diabetes mellitus), type 2, uncontrolled, with renal complications   Essential hypertension, benign   CAD (coronary artery disease)   Other and unspecified hyperlipidemia   Abnormal renal ultrasound with left renal lesion and bladder wall thickening   PAF (paroxysmal atrial fibrillation)   Lower extremity edema   Hypoglycemia   Acute renal failure superimposed on stage 4 chronic kidney disease   Acute on chronic combined systolic and diastolic heart failure   Syncope   Anemia  Acute exacerbation systolic and diastolic CHF s/p ICD 4/0/9811: LE edema greatly improved. He was seen by heart failure team. He was switched to Lasix 80mg  IV BID yesterday and started on milrinone. Suspect CHF aggravated by atrial fibrillation and suspect cardiorenal syndrome. ASA 81mg  d/c'd while on edoxaban and given renal issues. -No ACE-I or ARBs given his renal disease -daily weights, strict i/o's  -d/c'd ASA 81mg  daily. -continue statin  -continue carvedilol 25mg  BID  -given worse renal function and pt reluctant to undergo RHC or central line - d/c lasix and continue milrinone for now. -Load amio over weekend per cardiology -DCCV Monday per cardiology  Gout flare: Resolving. Tapering prednisone. -Norco 5/325mg  Q6hr prn pain -30mg  prednisone daily x 1 days, 20mg  x 1 day then d/c  AKI on CKD, stage IV, history of renal cystic lesion: Baseline Cr 2.1-2.3. On admission his Cr is 2.84. Increased to 3.08 today. -Recommend renal ultrasound in 6-12 months since he cannot obtain an MRI due to his ICD. -rest of plan as above  Syncope: Troponin negative and orthostatics negative -continue to monitor  Anemia: 2/2 chronic disease with possible Fe  deficiency. Hgb remains stable. -ferrous sulfate 325mg  BID -obtain FOBT  Hypoglycemia: No further episodes. Hgb A1c 6.9 -Accuchecks QID -resistant SSI  -6u mealtime novolog TID -lantus 30u QHS  HTN: normotensive here. On coreg 25mg  BID, lasix 40mg  TID, hydralazine 50mg  TID, Imdur 60mg  daily at home.  -continue hydralazine 50mg  TID  -continue Imdur 60mg  daily  -Lasix as above  -continue Coreg 25mg  BID   Asthma: on dulera 200/5 mcg and albuterol prn at home. -continue albuterol 2 puff Q6hr prn  -continue dulera 200/5 mcg 2 puff BID   A fib: on edoxaban at home. -continue edoxaban   FEN: Heart healthy, carb modified   DVT ppx: SCDs, edoxaban  Dispo: ongoing  The patient does have a current PCP Tanya Nones, MD) and does not need an [facility] hospital follow-up appointment after discharge.  The patient does not have transportation limitations that hinder transportation to clinic appointments.  .Services Needed at time of discharge: Y = Yes, Blank = No PT: Outpatient cardiac rehab  OT:   RN:   Equipment:   Other:     LOS: 5 days   Jacques Earthly, MD 07/21/2014, 4:44 PM

## 2014-07-21 NOTE — Progress Notes (Signed)
Heart Failure Navigator Consult Note  Presentation: Tony Freeman  is a 63 year old man with history of atrial fibrillation on edoxaban, CAD s/p PCI LAD/RCA 2007, LAD/RCA 2009, LAD 4098, systolic and diastolic CHF (echo 10/27/9145 with LV EF: 25-30%, diffuse hypokinesis), ischemic cardiomyopathy s/p dual chamber ICD placement 06/29/2014, DM2, CKD, HLD presenting with LE edema and SOB x 1 week. It had been worsening. He reports his weight increased from 218 to 230 (12 lbs). He was seen by his PCP 3 days ago who increased his Lasix from BID to TID. Since increasing his Lasix, he felt that his LE edema has continued to worsen and he has gained an additional 2 lbs. He reports dry cough, increased orthopnea (2 to 3 pillow), worsened exertional dyspnea, PND. Denies fevers, chills, chest pain, nausea, vomiting, headache, abdominal pain, diarrhea, dysuria, hematuria, paresthesias, myalgias, arthralgias.   Past Medical History  Diagnosis Date  . Diabetes mellitus with nephropathy   . Systolic and diastolic CHF, chronic     a. Echo at [facility] 11/17/2013 Ef <25%, severe hypokinesis of LV, moderately dilated LA, grade IV/IV diastolic dysfunction with irreversible restrictive pattern of mitral inflow, severe pulm HTN (RVSP >5mmHg), 1-2+ MR  b. Echo at [facility] 04/02/14 EF 25-30%, moderate concentric LVH, diffuse hypokinesis, moderately dilated LA, no significant MR observed  . CAD (coronary artery disease)     a. PCI LAD/RCA in 2007  b. PCI LAD/RCA in 2009 at [facility]  c. PCI LAD 08/2013, CTO of RCA and nonobstructive disease in the LCx.  Marland Kitchen Hyperlipidemia   . Stroke   . Dementia   . Ischemic cardiomyopathy     a. s/p Biotronik ICD 06/2014.  Marland Kitchen Retinopathy     bilateral  . CKD (chronic kidney disease), stage IV   . HTN (hypertension)   . Abnormal renal ultrasound     a. 03/2014: Abnormal renal ultrasound with left renal lesion and bladder wall thickening and microscopic hematuria. Multiple cysts on prior  MRI. Given inability to use contrast, f/u imaging limited, repeat US 3 months with outpatient uro f/u.  Marland Kitchen Paroxysmal atrial fibrillation   . Paroxysmal atrial flutter     History   Social History  . Marital Status: Unknown    Spouse Name: N/A    Number of Children: N/A  . Years of Education: N/A   Social History Main Topics  . Smoking status: Never Smoker   . Smokeless tobacco: None  . Alcohol Use: None  . Drug Use: No  . Sexual Activity: None   Other Topics Concern  . None   Social History Narrative   Going to be living with his son.  He lives with his daughter currently.  2 children.  Disability since CVA    ECHO:Study Conclusions--04/02/14  - Left ventricle: False tendon in LV apex of no clinical significance. The cavity size was normal. There was moderate concentric hypertrophy. Systolic function was severely reduced. The estimated ejection fraction was in the range of 25% to 30%. Diffuse hypokinesis. - Aortic valve: Trileaflet; mildly thickened leaflets. - Left atrium: The atrium was moderately dilated. - Tricuspid valve: There was trivial regurgitation.  Transthoracic echocardiography. M-mode, complete 2D, spectral Doppler, and color Doppler. Birthdate: Patient birthdate: 02-Jun-1951. Age: Patient is 63 yr old. Sex: Gender: male. Height: Height: 167.6 cm. Height: 66 in. Weight: Weight: 89.8 kg. Weight: 197.6 lb. Body mass index: BMI: 32 kg/m^2. Body surface area: BSA: 2.08 m^2. Blood pressure: 110/65 Patient status: Inpatient. Study date: Study date: 04/02/2014.  Study time: 04:08 PM. Location: ICU/CCU    BNP    Component Value Date/Time   PROBNP 12348.0* 07/16/2014 1401    Education Assessment and Provision:  Detailed education and instructions provided on heart failure disease management including the following:  Signs and symptoms of Heart Failure When to call the physician Importance of daily weights Low sodium diet Fluid restriction Medication  management Anticipated future follow-up appointments  Patient education given on each of the above topics.  Patient acknowledges understanding and acceptance of all instructions.  Patient with limited previous knowledge of HF.  He asked many pertinent questions.  He lives with his adult son.  He does have a scale yet does not weigh daily currently.  He says he will be able to without a problem.  I reviewed all above topics and he verbalizes understanding.  I will come back to review education with him on Monday.   Education Materials:  "Living Better With Heart Failure" Booklet, Daily Weight Tracker Tool and Heart Failure Educational Video.   High Risk Criteria for Readmission and/or Poor Patient Outcomes:  (Recommend Follow-up with Advanced Heart Failure Clinic)   EF <30%- Yes 25-30%  2 or more admissions in 6 months-Yes  Difficult social situation-No  Demonstrates medication noncompliance- No    Barriers of Care:  Knowledge, ability to be compliant  Discharge Planning:   Plans to discharge to home with son

## 2014-07-21 NOTE — Progress Notes (Addendum)
Advanced Heart Failure Rounding Note   Subjective:    Tony Freeman is a 63 year old man with history of atrial fibrillation now on edoxaban, CAD (s/p multiple stents) chronic combined CHF (echo 04/02/2014 with LV EF: 25-30%, diffuse hypokinesis), ischemic cardiomyopathy s/p dual chamber ICD placement 06/29/2014, DM2, CKD stage IV, asthma, HLD, abnormal renal US 03/2014 (followed by urology) whom we are asked to see for syncope and CHF.    Yesterday he was started on Lasix and Milrinone . Also amiodarone started 400 mg bid. Weight down 1 pound.   Labs pending.    Objective:   Weight Range:  Vital Signs:   Temp:  [98 F (36.7 C)-98.4 F (36.9 C)] 98.4 F (36.9 C) (09/25 0432) Pulse Rate:  [54-97] 80 (09/25 0432) Resp:  [16-18] 18 (09/25 0432) BP: (118-136)/(60-84) 123/60 mmHg (09/25 0432) SpO2:  [94 %-100 %] 100 % (09/25 0432) Weight:  [228 lb 0.4 oz (103.43 kg)] 228 lb 0.4 oz (103.43 kg) (09/25 0432) Last BM Date: 07/20/14  Weight change: Filed Weights   07/19/14 0449 07/20/14 0530 07/21/14 0432  Weight: 226 lb 3.1 oz (102.6 kg) 229 lb 1.6 oz (103.919 kg) 228 lb 0.4 oz (103.43 kg)    Intake/Output:   Intake/Output Summary (Last 24 hours) at 07/21/14 0738 Last data filed at 07/21/14 0610  Gross per 24 hour  Intake 852.48 ml  Output   1101 ml  Net -248.52 ml     Physical Exam: General:  Well appearing. No resp difficulty HEENT: normal Neck: supple. JVP ~120  . Carotids 2+ bilat; no bruits. No lymphadenopathy or thryomegaly appreciated. Cor: PMI nondisplaced. Regular rate & rhythm. No rubs, gallops or murmurs. Lungs: clear Abdomen: Obese, soft, nontender, nondistended. No hepatosplenomegaly. No bruits or masses. Good bowel sounds. Extremities: no cyanosis, clubbing, rash, edema Neuro: alert & orientedx3, cranial nerves grossly intact. moves all 4 extremities w/o difficulty. Affect pleasant  Telemetry: Afib 80s   Labs: Basic Metabolic Panel:  Recent Labs Lab  07/16/14 1401 07/17/14 0350 07/18/14 0256 07/19/14 0915 07/20/14 0945  NA 142 142 140 140 133*  K 3.8 3.9 3.6* 4.0 4.5  CL 101 103 99 99 93*  CO2 28 27 27 29 26   GLUCOSE 36* 139* 124* 188* 381*  BUN 52* 50* 48* 52* 62*  CREATININE 2.84* 2.59* 2.59* 2.77* 2.92*  CALCIUM 9.2 9.1 8.8 9.4 9.3  MG  --  2.2  --  2.1  --     Liver Function Tests:  Recent Labs Lab 07/17/14 0350  AST 28  ALT 16  ALKPHOS 108  BILITOT 1.4*  PROT 6.6  ALBUMIN 3.0*   No results found for this basename: LIPASE, AMYLASE,  in the last 168 hours No results found for this basename: AMMONIA,  in the last 168 hours  CBC:  Recent Labs Lab 07/16/14 1401 07/17/14 0350 07/18/14 1259 07/19/14 0915  WBC 8.8 6.9 8.3 8.5  NEUTROABS  --  4.9  --   --   HGB 9.1* 9.0* 9.3* 9.2*  HCT 28.4* 28.6* 29.4* 29.7*  MCV 91.6 92.0 93.0 92.5  PLT 194 180 187 168    Cardiac Enzymes:  Recent Labs Lab 07/16/14 2314 07/17/14 0350 07/17/14 1108 07/20/14 0945  TROPONINI <0.30 <0.30 <0.30 <0.30    BNP: BNP (last 3 results)  Recent Labs  07/16/14 1401  PROBNP 12348.0*     Other results:  EKG:   Imaging:  No results found.   Medications:     Scheduled Medications: .  amiodarone  400 mg Oral BID  . atorvastatin  40 mg Oral Daily  . carvedilol  25 mg Oral BID WC  . cholecalciferol  2,000 Units Oral Daily  . edoxaban  30 mg Oral Daily  . ferrous sulfate  325 mg Oral BID WC  . furosemide  80 mg Intravenous BID  . hydrALAZINE  50 mg Oral TID  . insulin aspart  0-20 Units Subcutaneous TID WC  . insulin glargine  20 Units Subcutaneous QHS  . isosorbide mononitrate  60 mg Oral Daily  . mometasone-formoterol  2 puff Inhalation BID  . multivitamin with minerals  1 tablet Oral Daily  . predniSONE  30 mg Oral Q breakfast  . sodium chloride  3 mL Intravenous Q12H     Infusions: . milrinone 0.25 mcg/kg/min (07/21/14 0252)     PRN Medications:  acetaminophen, albuterol,  HYDROcodone-acetaminophen, ondansetron (ZOFRAN) IV, ramelteon   Assessment:  1. A/C Combined HF  2. A fib  3. Possible Syncope 4. A/C CKD Stage IV 5. Renal Lesions  6. Anemia 7. CAD S/P PCI   Plan/Discussion:     Length of Stay: 5 CLEGG,AMY NP-C  07/21/2014, 7:38 AM Advanced Heart Failure Team Pager [phone] (M-F; 7a - 4p)  Please contact [facility] for night-coverage after hours (4p -7a ) and weekends on [email or web address]  Patient seen and examined with Darrick Grinder, NP. We discussed all aspects of the encounter. I agree with the assessment and plan as stated above.   Milrinone started last night. Urine output picked up a bit. No labs yet. He only has mild edema but weight up at least 10 pounds and JVP up. Continue milrinone for now. Await labs. If renal function worsening may need RHC. Load amio over weekend. Plan DC-CV Monday. He has been on edoxaban without missing a dose so no TEE needed.   Daniel Bensimhon,MD 8:00 AM  Addendum: Renal function worse today. He is somewhat reluctant about RHC or central line. Edema looks better and he says he doesn't feel like he is bloated. Will stop lasix and continue milrinone for now. If renal function not improving over weekend can revisit invasive approach on Monday.  Daniel Bensimhon,MD 2:20 PM

## 2014-07-21 NOTE — Progress Notes (Signed)
Son came in to visit patient earlier. I received a call form patients daughter whom lives in [place] stating that she had received a call from her brother and that he told her that his dad Tony Freeman had 2 episodes of unconsciousness after a coughing spell. He said that the episode only lasted a second. Darrick Grinder NP notified.

## 2014-07-21 NOTE — Progress Notes (Signed)
Inpatient Diabetes Program Recommendations  AACE/ADA: New Consensus Statement on Inpatient Glycemic Control (2013)  Target Ranges:  Prepandial:   less than 140 mg/dL      Peak postprandial:   less than 180 mg/dL (1-2 hours)      Critically ill patients:  140 - 180 mg/dL     Results for Tony Freeman, Tony Freeman (MRN [id number]) as of 07/21/2014 10:37  Ref. Range 07/20/2014 07:52 07/20/2014 11:33 07/20/2014 17:01 07/20/2014 20:52  Glucose-Capillary Latest Range: 70-99 mg/dL 371 (H) 334 (H) 275 (H) 295 (H)    Results for Tony Freeman, Tony Freeman (MRN [id number]) as of 07/21/2014 10:37  Ref. Range 07/21/2014 06:08  Glucose-Capillary Latest Range: 70-99 mg/dL 285 (H)      Home Insulin: Lantus 45 units QHS + Novolog 10 units tid with meals  Current Orders: Lantus 20 units QHS + Novolog Resistant SSI    Patient eating 100% of meals.  No more hypoglycemic events noted.  Patient having significant glucose elevations.    MD- Please consider the following:  1. Increase Lantus to 30 units QHS 2. Add Novolog Meal Coverage- Novolog 6 units tid with meals     Will follow Wyn Quaker RN, MSN, CDE Diabetes Coordinator Inpatient Diabetes Program Team Pager: [phone] (8a-10p)

## 2014-07-21 NOTE — Progress Notes (Signed)
  Date: 07/21/2014  Patient name: Tony Freeman  Medical record number: [id number]  Date of birth: 1951/05/03   This patient has been seen and the plan of care was discussed with the house staff. Please see their note for complete details. I concur with their findings with the following additions/corrections:  Patient reports that he is improving.  Cardiology following and managing milrinone drip.  Per report from nursing, 2 further episodes of syncope, no events on telemetry at that time.  Continue to monitor on telemetry.   Sid Falcon, MD 07/21/2014, 11:00 PM

## 2014-07-21 NOTE — Progress Notes (Signed)
Pt CBG 397 tonight. 30 units of lantus scheduled QHS (increased from 20units last night). Pt states he takes 45 units of lantus at home each night. No HS SSI coverage ordered. MD on call notified and new order for HS SSI coverage. Pt educated and verbalized understanding.

## 2014-07-22 LAB — BASIC METABOLIC PANEL
ANION GAP: 16 — AB (ref 5–15)
BUN: 78 mg/dL — ABNORMAL HIGH (ref 6–23)
CALCIUM: 9.2 mg/dL (ref 8.4–10.5)
CHLORIDE: 93 meq/L — AB (ref 96–112)
CO2: 24 mEq/L (ref 19–32)
CREATININE: 3.02 mg/dL — AB (ref 0.50–1.35)
GFR calc Af Amer: 24 mL/min — ABNORMAL LOW (ref >90)
GFR calc non Af Amer: 21 mL/min — ABNORMAL LOW (ref >90)
GLUCOSE: 270 mg/dL — AB (ref 70–99)
Potassium: 4.2 mEq/L (ref 3.7–5.3)
SODIUM: 133 meq/L — AB (ref 137–147)

## 2014-07-22 LAB — GLUCOSE, CAPILLARY
Glucose-Capillary: 288 mg/dL — ABNORMAL HIGH (ref 70–99)
Glucose-Capillary: 183 mg/dL — ABNORMAL HIGH (ref 70–99)
Glucose-Capillary: 290 mg/dL — ABNORMAL HIGH (ref 70–99)
Glucose-Capillary: 268 mg/dL — ABNORMAL HIGH (ref 70–99)

## 2014-07-22 MED ORDER — FUROSEMIDE 10 MG/ML IJ SOLN
40.0000 mg | Freq: Two times a day (BID) | INTRAMUSCULAR | Status: DC
  Administered 2014-07-22 (×2): 40 mg via INTRAVENOUS
  Filled 2014-07-22 (×3): qty 4

## 2014-07-22 NOTE — Progress Notes (Signed)
Primary cardiologist: Dr. Pierre Bali  Subjective:   No overall change in breathing status and edema. States he feels about the same.   Objective:   Temp:  [97.5 F (36.4 C)-98 F (36.7 C)] 97.5 F (36.4 C) (09/26 0619) Pulse Rate:  [72-93] 78 (09/26 0619) Resp:  [18-20] 20 (09/26 0619) BP: (108-143)/(63-81) 133/72 mmHg (09/26 0619) SpO2:  [94 %-99 %] 95 % (09/26 0732) Weight:  [235 lb 3.7 oz (106.7 kg)] 235 lb 3.7 oz (106.7 kg) (09/26 0619) Last BM Date: 07/20/14  Filed Weights   07/20/14 0530 07/21/14 0432 07/22/14 5027  Weight: 229 lb 1.6 oz (103.919 kg) 228 lb 0.4 oz (103.43 kg) 235 lb 3.7 oz (106.7 kg)    Intake/Output Summary (Last 24 hours) at 07/22/14 1042 Last data filed at 07/22/14 0700  Gross per 24 hour  Intake   1080 ml  Output    975 ml  Net    105 ml    Telemetry: Rate-controlled atrial fibrillation.  Exam:  General: Obese male, no distress.   Lungs: Decreased breath sounds, nonlabored.           Cardiac: Irregularly irregular, indistinct PMI.  Abdomen: Protuberant.  Extremities: 2+ edema.   Lab Results:  Basic Metabolic Panel:  Recent Labs Lab 07/17/14 0350  07/19/14 0915 07/20/14 0945 07/21/14 0950 07/22/14 0450  NA 142  < > 140 133* 135* 133*  K 3.9  < > 4.0 4.5 4.1 4.2  CL 103  < > 99 93* 94* 93*  CO2 27  < > 29 26 26 24   GLUCOSE 139*  < > 188* 381* 248* 270*  BUN 50*  < > 52* 62* 72* 78*  CREATININE 2.59*  < > 2.77* 2.92* 3.08* 3.02*  CALCIUM 9.1  < > 9.4 9.3 9.4 9.2  MG 2.2  --  2.1  --   --   --   < > = values in this interval not displayed.  Liver Function Tests:  Recent Labs Lab 07/17/14 0350  AST 28  ALT 16  ALKPHOS 108  BILITOT 1.4*  PROT 6.6  ALBUMIN 3.0*    CBC:  Recent Labs Lab 07/17/14 0350 07/18/14 1259 07/19/14 0915  WBC 6.9 8.3 8.5  HGB 9.0* 9.3* 9.2*  HCT 28.6* 29.4* 29.7*  MCV 92.0 93.0 92.5  PLT 180 187 168    Cardiac Enzymes:  Recent Labs Lab 07/17/14 0350 07/17/14 1108  07/20/14 0945  TROPONINI <0.30 <0.30 <0.30    BNP:  Recent Labs  07/16/14 1401  PROBNP 12348.0*     Medications:   Scheduled Medications: . amiodarone  400 mg Oral BID  . atorvastatin  40 mg Oral Daily  . carvedilol  25 mg Oral BID WC  . cholecalciferol  2,000 Units Oral Daily  . edoxaban  30 mg Oral Daily  . ferrous sulfate  325 mg Oral BID WC  . hydrALAZINE  50 mg Oral TID  . insulin aspart  0-20 Units Subcutaneous TID WC  . insulin aspart  0-5 Units Subcutaneous QHS  . insulin aspart  6 Units Subcutaneous TID WC  . insulin glargine  30 Units Subcutaneous QHS  . isosorbide mononitrate  60 mg Oral Daily  . mometasone-formoterol  2 puff Inhalation BID  . multivitamin with minerals  1 tablet Oral Daily  . [START ON 07/23/2014] predniSONE  20 mg Oral Q breakfast  . sodium chloride  3 mL Intravenous Q12H     Infusions: . milrinone  0.25 mcg/kg/min (07/22/14 0541)     PRN Medications:  acetaminophen, albuterol, HYDROcodone-acetaminophen, ondansetron (ZOFRAN) IV, ramelteon   Assessment:   1. 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 Currently on milrinone. Lasix stopped yesterday. Weight is up, urine output less vigorous.  2. Persistent atrial fibrillation, oral amiodarone load 400 mg twice daily for now. On Savaysa. Possible TEE cardioversion next week.  3. Acute on chronic renal insufficiency with CKD stage IV. Creatinine stable at 3.0.  4. Chronic anemia.  5. History of CAD status post multiple PCI's, no active angina.   Plan/Discussion:    Plan to resume Lasix, continue milrinone. Follow intake and output, renal function. Continue oral amiodarone load.   Satira Sark, M.D., F.A.C.C.

## 2014-07-22 NOTE — Progress Notes (Signed)
Subjective: Pt choked on his breakfast today (eggs and muffin) and felt sob.  This is 1st episode of choking and he denies dysphagia.  Otherwise denies chest pain, sob, LOC.    Objective: Vital signs in last 24 hours: Filed Vitals:   07/21/14 2027 07/22/14 0250 07/22/14 0619 07/22/14 0732  BP: 143/74 120/81 133/72   Pulse: 72 83 78   Temp: 97.8 F (36.6 C)  97.5 F (36.4 C)   TempSrc: Oral  Oral   Resp: 20 18 20    Height:      Weight:   235 lb 3.7 oz (106.7 kg)   SpO2: 99% 96% 95% 95%   Weight change: 7 lb 3.3 oz (3.27 kg)  Intake/Output Summary (Last 24 hours) at 07/22/14 0912 Last data filed at 07/22/14 0700  Gross per 24 hour  Intake   1320 ml  Output    975 ml  Net    345 ml   Vitals reviewed. General: resting on side of bed, NAD HEENT: [place]/at, no scleral icterus Cardiac: irreg rhythm with wnl rate, no rubs, murmurs or gallops Pulm: clear to auscultation bilaterally, no wheezes, rales, or rhonchi Abd: soft, nontender, nondistended, BS present Ext: warm and well perfused, 2+ pedal edema to knees b/l  Neuro: alert and oriented X3, cranial nerves II-XII grossly intact Lab Results: Basic Metabolic Panel:  Recent Labs Lab 07/17/14 0350  07/19/14 0915  07/21/14 0950 07/22/14 0450  NA 142  < > 140  < > 135* 133*  K 3.9  < > 4.0  < > 4.1 4.2  CL 103  < > 99  < > 94* 93*  CO2 27  < > 29  < > 26 24  GLUCOSE 139*  < > 188*  < > 248* 270*  BUN 50*  < > 52*  < > 72* 78*  CREATININE 2.59*  < > 2.77*  < > 3.08* 3.02*  CALCIUM 9.1  < > 9.4  < > 9.4 9.2  MG 2.2  --  2.1  --   --   --   < > = values in this interval not displayed. Liver Function Tests:  Recent Labs Lab 07/17/14 0350  AST 28  ALT 16  ALKPHOS 108  BILITOT 1.4*  PROT 6.6  ALBUMIN 3.0*   CBC:  Recent Labs Lab 07/16/14 1401 07/17/14 0350 07/18/14 1259 07/19/14 0915  WBC 8.8 6.9 8.3 8.5  NEUTROABS  --  4.9  --   --   HGB 9.1* 9.0* 9.3* 9.2*  HCT 28.4* 28.6* 29.4* 29.7*  MCV 91.6 92.0 93.0  92.5  PLT 194 180 187 168   Cardiac Enzymes:  Recent Labs Lab 07/17/14 0350 07/17/14 1108 07/20/14 0945  TROPONINI <0.30 <0.30 <0.30   BNP:  Recent Labs Lab 07/16/14 1401  PROBNP 12348.0*    CBG:  Recent Labs Lab 07/20/14 2052 07/21/14 0608 07/21/14 1118 07/21/14 1626 07/21/14 2109 07/22/14 0545  GLUCAP 295* 285* 236* 319* 397* 288*   Hemoglobin A1C:  Recent Labs Lab 07/16/14 2314  HGBA1C 6.9*   Fasting Lipid Panel:  Recent Labs Lab 07/17/14 0350  CHOL 77  HDL 27*  LDLCALC 40  TRIG 51  CHOLHDL 2.9   Thyroid Function Tests:  Recent Labs Lab 07/21/14 0410  TSH 0.413  FREET4 1.44   Anemia Panel:  Recent Labs Lab 07/18/14 1259  FERRITIN 398*  TIBC 274  IRON 25*  RETICCTPCT 3.4*   Urine Drug Screen: Drugs of Abuse  Component Value Date/Time   LABOPIA NONE DETECTED 07/17/2014 2045   COCAINSCRNUR NONE DETECTED 07/17/2014 2045   LABBENZ NONE DETECTED 07/17/2014 2045   AMPHETMU NONE DETECTED 07/17/2014 2045   THCU NONE DETECTED 07/17/2014 2045   LABBARB NONE DETECTED 07/17/2014 2045    Urinalysis:  Recent Labs Lab 07/16/14 1600  COLORURINE YELLOW  LABSPEC 1.012  PHURINE 6.0  GLUCOSEU NEGATIVE  HGBUR NEGATIVE  BILIRUBINUR NEGATIVE  KETONESUR NEGATIVE  PROTEINUR 100*  UROBILINOGEN 1.0  NITRITE NEGATIVE  LEUKOCYTESUR NEGATIVE   Misc. Labs: none  Micro Results: No results found for this or any previous visit (from the past 240 hour(s)). Studies/Results: No results found. Medications:  Scheduled Meds: . amiodarone  400 mg Oral BID  . atorvastatin  40 mg Oral Daily  . carvedilol  25 mg Oral BID WC  . cholecalciferol  2,000 Units Oral Daily  . edoxaban  30 mg Oral Daily  . ferrous sulfate  325 mg Oral BID WC  . hydrALAZINE  50 mg Oral TID  . insulin aspart  0-20 Units Subcutaneous TID WC  . insulin aspart  0-5 Units Subcutaneous QHS  . insulin aspart  6 Units Subcutaneous TID WC  . insulin glargine  30 Units Subcutaneous  QHS  . isosorbide mononitrate  60 mg Oral Daily  . mometasone-formoterol  2 puff Inhalation BID  . multivitamin with minerals  1 tablet Oral Daily  . [START ON 07/23/2014] predniSONE  20 mg Oral Q breakfast  . sodium chloride  3 mL Intravenous Q12H   Continuous Infusions: . milrinone 0.25 mcg/kg/min (07/22/14 0541)   PRN Meds:.acetaminophen, albuterol, HYDROcodone-acetaminophen, ondansetron (ZOFRAN) IV, ramelteon Assessment/Plan: 63 y.o with h/o CAD, A/C combined CHF, AKI on CKD 4, AF with initially weight gain, leg edema, orthopnea, sob with exertion then with syncopal episode this admission.  #A/C combined CHF -appreciate HF team recommendations currently on Milrinone, holding Lasix -will f/u cards recs today -continue BB, Hydralazine 50 mg tid, Imdur  -strict ins/outs  #Atrial fibrillation  -rate controlled.  Amiodarone 400 mg bid  -cards may do DCCV on Monday  #AKI on Chronic kidney disease (CKD), stage IV (severe) -? If CRS -will monitor strict ins and outs -trend BMET daily   #?synocopal episode, resolved -troponin and orthostatics negative.    #DM (diabetes mellitus), type 2, uncontrolled, with renal complications and hyperglycemic currently on steroids  -still hyperglycemic though trending down after Lantus increased to 30 units and, SSI-R with qhs and 6 units tid   #Essential hypertension -BP controlled on Coreg 25 mg bid, Hydralazine 50 mg tid, Imdur   #Normocytic Anemia #hyperlipidemia -Continue Lipitor   #renal cysts  -will need repeat renal US in 6-12 months  #Right ankle pain, improved  -likely gout due to Lasix use this admission  -Prednisone today then off, prn Norco   #Insomnia -Ramelteon prn   #F/E/N -NSL -electrolytes in am  -HH diet   #DVT px  -Edoxaban, scds   Dispo: Disposition is deferred at this time, awaiting improvement of current medical problems.  Anticipated discharge in approximately 3-5 day(s).   The patient does have a  current PCP Tanya Nones, MD) and does not need an [facility] hospital follow-up appointment after discharge.  The patient does not have transportation limitations that hinder transportation to clinic appointments.  .Services Needed at time of discharge: Y = Yes, Blank = No PT:   OT:   RN:   Equipment:   Other: Resume H/H RN, outpatient cardiac rehab  LOS: 6 days   Cresenciano Genre, MD [phone] 07/22/2014, 9:12 AM

## 2014-07-23 DIAGNOSIS — I10 Essential (primary) hypertension: Secondary | ICD-10-CM

## 2014-07-23 LAB — BASIC METABOLIC PANEL
ANION GAP: 16 — AB (ref 5–15)
BUN: 81 mg/dL — ABNORMAL HIGH (ref 6–23)
CO2: 24 mEq/L (ref 19–32)
Calcium: 9.3 mg/dL (ref 8.4–10.5)
Chloride: 94 mEq/L — ABNORMAL LOW (ref 96–112)
Creatinine, Ser: 3.15 mg/dL — ABNORMAL HIGH (ref 0.50–1.35)
GFR, EST AFRICAN AMERICAN: 23 mL/min — AB (ref >90)
GFR, EST NON AFRICAN AMERICAN: 20 mL/min — AB (ref >90)
Glucose, Bld: 207 mg/dL — ABNORMAL HIGH (ref 70–99)
Potassium: 4.2 mEq/L (ref 3.7–5.3)
SODIUM: 134 meq/L — AB (ref 137–147)

## 2014-07-23 LAB — GLUCOSE, CAPILLARY
GLUCOSE-CAPILLARY: 227 mg/dL — AB (ref 70–99)
Glucose-Capillary: 176 mg/dL — ABNORMAL HIGH (ref 70–99)
Glucose-Capillary: 198 mg/dL — ABNORMAL HIGH (ref 70–99)
Glucose-Capillary: 283 mg/dL — ABNORMAL HIGH (ref 70–99)

## 2014-07-23 LAB — MAGNESIUM: MAGNESIUM: 2.3 mg/dL (ref 1.5–2.5)

## 2014-07-23 MED ORDER — FUROSEMIDE 10 MG/ML IJ SOLN
80.0000 mg | Freq: Two times a day (BID) | INTRAMUSCULAR | Status: DC
  Administered 2014-07-23 – 2014-07-24 (×2): 80 mg via INTRAVENOUS
  Filled 2014-07-23 (×3): qty 8

## 2014-07-23 NOTE — Progress Notes (Signed)
Primary cardiologist: Dr. Pierre Bali  Subjective:   No overall change in symptoms, leg still feels swollen.   Objective:   Temp:  [97.9 F (36.6 C)-98.3 F (36.8 C)] 97.9 F (36.6 C) (09/27 0544) Pulse Rate:  [52-86] 77 (09/27 0544) Resp:  [18-20] 20 (09/27 0544) BP: (120-141)/(52-97) 128/78 mmHg (09/27 0544) SpO2:  [93 %-99 %] 93 % (09/27 0700) Weight:  [236 lb 5.3 oz (107.2 kg)] 236 lb 5.3 oz (107.2 kg) (09/27 0544) Last BM Date: 07/22/14  Filed Weights   07/21/14 0432 07/22/14 0619 07/23/14 0544  Weight: 228 lb 0.4 oz (103.43 kg) 235 lb 3.7 oz (106.7 kg) 236 lb 5.3 oz (107.2 kg)    Intake/Output Summary (Last 24 hours) at 07/23/14 0905 Last data filed at 07/23/14 0841  Gross per 24 hour  Intake   1740 ml  Output    400 ml  Net   1340 ml    Telemetry: Rate-controlled atrial fibrillation.  Exam:  General: Obese male, no distress.   Lungs: Decreased breath sounds, nonlabored.           Cardiac: Irregularly irregular, indistinct PMI.  Abdomen: Protuberant.  Extremities: 2+ edema.   Lab Results:  Basic Metabolic Panel:  Recent Labs Lab 07/17/14 0350  07/19/14 0915  07/21/14 0950 07/22/14 0450 07/23/14 0351  NA 142  < > 140  < > 135* 133* 134*  K 3.9  < > 4.0  < > 4.1 4.2 4.2  CL 103  < > 99  < > 94* 93* 94*  CO2 27  < > 29  < > 26 24 24   GLUCOSE 139*  < > 188*  < > 248* 270* 207*  BUN 50*  < > 52*  < > 72* 78* 81*  CREATININE 2.59*  < > 2.77*  < > 3.08* 3.02* 3.15*  CALCIUM 9.1  < > 9.4  < > 9.4 9.2 9.3  MG 2.2  --  2.1  --   --   --  2.3  < > = values in this interval not displayed.  Liver Function Tests:  Recent Labs Lab 07/17/14 0350  AST 28  ALT 16  ALKPHOS 108  BILITOT 1.4*  PROT 6.6  ALBUMIN 3.0*    CBC:  Recent Labs Lab 07/17/14 0350 07/18/14 1259 07/19/14 0915  WBC 6.9 8.3 8.5  HGB 9.0* 9.3* 9.2*  HCT 28.6* 29.4* 29.7*  MCV 92.0 93.0 92.5  PLT 180 187 168    Cardiac Enzymes:  Recent Labs Lab  07/17/14 0350 07/17/14 1108 07/20/14 0945  TROPONINI <0.30 <0.30 <0.30    BNP:  Recent Labs  07/16/14 1401  PROBNP 12348.0*     Medications:   Scheduled Medications: . amiodarone  400 mg Oral BID  . atorvastatin  40 mg Oral Daily  . carvedilol  25 mg Oral BID WC  . cholecalciferol  2,000 Units Oral Daily  . edoxaban  30 mg Oral Daily  . ferrous sulfate  325 mg Oral BID WC  . furosemide  40 mg Intravenous BID  . hydrALAZINE  50 mg Oral TID  . insulin aspart  0-20 Units Subcutaneous TID WC  . insulin aspart  0-5 Units Subcutaneous QHS  . insulin aspart  6 Units Subcutaneous TID WC  . insulin glargine  30 Units Subcutaneous QHS  . isosorbide mononitrate  60 mg Oral Daily  . mometasone-formoterol  2 puff Inhalation BID  . multivitamin with minerals  1 tablet Oral Daily  .  predniSONE  20 mg Oral Q breakfast  . sodium chloride  3 mL Intravenous Q12H    Infusions: . milrinone 0.25 mcg/kg/min (07/23/14 0542)    PRN Medications: acetaminophen, albuterol, HYDROcodone-acetaminophen, ondansetron (ZOFRAN) IV, ramelteon   Assessment:   1. 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 Currently on milrinone. Lasix resumed yesterday. Weight is up a pound, urine output less vigorous.  2. Persistent atrial fibrillation, oral amiodarone load 400 mg twice daily for now. On Savaysa. Possible TEE cardioversion next week.  3. Acute on chronic renal insufficiency with CKD stage IV. Creatinine stable at 3.0.  4. Chronic anemia.  5. History of CAD status post multiple PCI's, no active angina.   Plan/Discussion:    Plan to increase Lasix, continue milrinone. Follow intake and output, renal function. Continue oral amiodarone load. Keep n.p.o. after midnight in case cardioversion pursued tomorrow. Suspect he will need to go ahead and proceed with right heart catheterization in light of difficulty managing fluid status and increasing creatinine.   Satira Sark, M.D.,  F.A.C.C.

## 2014-07-23 NOTE — Progress Notes (Signed)
Subjective: Pt doing well denies choking on food or LOC today. He denies chest pain or sob, though leg swelling is increasing and weight is up BL wt per pt and son is 215-220 lbs.  He has been on Edoxaban since the 2nd week in September.   Pt was straining to have stool this am but wants to wait before we add a stool softner   Son [place] at bedside updated but son would like cardiologist to call him 9/28 after 1 PM if he has DCCV  RN: lost iv access iv team called   Objective: Vital signs in last 24 hours: Filed Vitals:   07/22/14 2238 07/23/14 0237 07/23/14 0544 07/23/14 0700  BP:  127/78 128/78   Pulse:  79 77   Temp:  97.9 F (36.6 C) 97.9 F (36.6 C)   TempSrc:  Oral Oral   Resp:  18 20   Height:      Weight:   236 lb 5.3 oz (107.2 kg)   SpO2: 97% 98% 97% 93%   Weight change: 1 lb 1.6 oz (0.5 kg)  Intake/Output Summary (Last 24 hours) at 07/23/14 1049 Last data filed at 07/23/14 0841  Gross per 24 hour  Intake   1740 ml  Output    400 ml  Net   1340 ml   Vitals reviewed. General: resting on side of bed, NAD HEENT: [place]/at, no scleral icterus Cardiac: irreg rhythm with wnl rate, no rubs, murmurs or gallops Pulm: clear to auscultation bilaterally, no wheezes, rales, or rhonchi Abd: soft, nontender, mildly distended, BS present Ext: warm and well perfused, 2+ pedal edema to knees b/l  Neuro: alert and oriented X3, cranial nerves II-XII grossly intact Lab Results: Basic Metabolic Panel:  Recent Labs Lab 07/19/14 0915  07/22/14 0450 07/23/14 0351  NA 140  < > 133* 134*  K 4.0  < > 4.2 4.2  CL 99  < > 93* 94*  CO2 29  < > 24 24  GLUCOSE 188*  < > 270* 207*  BUN 52*  < > 78* 81*  CREATININE 2.77*  < > 3.02* 3.15*  CALCIUM 9.4  < > 9.2 9.3  MG 2.1  --   --  2.3  < > = values in this interval not displayed. Liver Function Tests:  Recent Labs Lab 07/17/14 0350  AST 28  ALT 16  ALKPHOS 108  BILITOT 1.4*  PROT 6.6  ALBUMIN 3.0*   CBC:  Recent  Labs Lab 07/16/14 1401 07/17/14 0350 07/18/14 1259 07/19/14 0915  WBC 8.8 6.9 8.3 8.5  NEUTROABS  --  4.9  --   --   HGB 9.1* 9.0* 9.3* 9.2*  HCT 28.4* 28.6* 29.4* 29.7*  MCV 91.6 92.0 93.0 92.5  PLT 194 180 187 168   Cardiac Enzymes:  Recent Labs Lab 07/17/14 0350 07/17/14 1108 07/20/14 0945  TROPONINI <0.30 <0.30 <0.30   BNP:  Recent Labs Lab 07/16/14 1401  PROBNP 12348.0*    CBG:  Recent Labs Lab 07/21/14 2109 07/22/14 0545 07/22/14 1135 07/22/14 1718 07/22/14 2119 07/23/14 0646  GLUCAP 397* 288* 183* 290* 268* 176*   Hemoglobin A1C:  Recent Labs Lab 07/16/14 2314  HGBA1C 6.9*   Fasting Lipid Panel:  Recent Labs Lab 07/17/14 0350  CHOL 77  HDL 27*  LDLCALC 40  TRIG 51  CHOLHDL 2.9   Thyroid Function Tests:  Recent Labs Lab 07/21/14 0410  TSH 0.413  FREET4 1.44   Anemia Panel:  Recent  Labs Lab 07/18/14 1259  FERRITIN 398*  TIBC 274  IRON 25*  RETICCTPCT 3.4*   Urine Drug Screen: Drugs of Abuse     Component Value Date/Time   LABOPIA NONE DETECTED 07/17/2014 2045   COCAINSCRNUR NONE DETECTED 07/17/2014 2045   LABBENZ NONE DETECTED 07/17/2014 2045   AMPHETMU NONE DETECTED 07/17/2014 2045   THCU NONE DETECTED 07/17/2014 2045   LABBARB NONE DETECTED 07/17/2014 2045    Urinalysis:  Recent Labs Lab 07/16/14 1600  COLORURINE YELLOW  LABSPEC 1.012  PHURINE 6.0  GLUCOSEU NEGATIVE  HGBUR NEGATIVE  BILIRUBINUR NEGATIVE  KETONESUR NEGATIVE  PROTEINUR 100*  UROBILINOGEN 1.0  NITRITE NEGATIVE  LEUKOCYTESUR NEGATIVE   Studies/Results: No results found. Medications:  Scheduled Meds: . amiodarone  400 mg Oral BID  . atorvastatin  40 mg Oral Daily  . carvedilol  25 mg Oral BID WC  . cholecalciferol  2,000 Units Oral Daily  . edoxaban  30 mg Oral Daily  . ferrous sulfate  325 mg Oral BID WC  . furosemide  80 mg Intravenous BID  . hydrALAZINE  50 mg Oral TID  . insulin aspart  0-20 Units Subcutaneous TID WC  . insulin  aspart  0-5 Units Subcutaneous QHS  . insulin aspart  6 Units Subcutaneous TID WC  . insulin glargine  30 Units Subcutaneous QHS  . isosorbide mononitrate  60 mg Oral Daily  . mometasone-formoterol  2 puff Inhalation BID  . multivitamin with minerals  1 tablet Oral Daily  . sodium chloride  3 mL Intravenous Q12H   Continuous Infusions: . milrinone 0.25 mcg/kg/min (07/23/14 0542)   PRN Meds:.acetaminophen, albuterol, HYDROcodone-acetaminophen, ondansetron (ZOFRAN) IV, ramelteon Assessment/Plan: 63 y.o with h/o CAD, A/C combined CHF, AKI on CKD 4, AF with initially weight gain, leg edema, orthopnea, sob with exertion then with syncopal episode this admission.  Now with weight increased from baseline and worsening renal function.     #A/C combined CHF -appreciate HF team recommendations currently on Milrinone, Lasix 40 mg bid iv added 9/26 and increased today to 80 mg bid iv  -will f/u cards recs appreciate input.  Pt may have right heart cath if this admission if cardiology deems appropriate as well.   -continue BB, Hydralazine 50 mg tid, Imdur  -strict ins/outs, daily weights,  follow renal function   #Atrial fibrillation  -rate controlled.  Amiodarone 400 mg bid  -NPO after MN cards may do DCCV on Monday   #AKI on Chronic kidney disease (CKD), stage IV (severe) -? If CRS. Creatinine trended slightly up to 3.15 today -will monitor strict ins and outs -trend BMET daily   #DM (diabetes mellitus), type 2, uncontrolled, with renal complications and hyperglycemic currently on steroids  -still hyperglycemic though trending down after steroids being tapered off and Lantus increased to 30 units and, SSI-R with qhs and 6 units tid   #Essential hypertension -BP controlled on Coreg 25 mg bid, Hydralazine 50 mg tid, Imdur   #Normocytic Anemia -hbg stable  -will check CBC in am  #hyperlipidemia -Continue Lipitor   #renal cysts  -will need repeat renal US in 6-12 months  #Right ankle  pain, improved  -likely gout due to Lasix use this admission  -Prednisone off, prn Norco   #Insomnia -Ramelteon prn   #F/E/N -NSL -electrolytes in am  -HH diet then NPO at MN  #DVT px  -Edoxaban, scds   Dispo: Disposition is deferred at this time, awaiting improvement of current medical problems.  Anticipated discharge in  approximately 3-5 day(s).   The patient does have a current PCP Tanya Nones, MD) and does not need an [facility] hospital follow-up appointment after discharge.  The patient does not have transportation limitations that hinder transportation to clinic appointments.  .Services Needed at time of discharge: Y = Yes, Blank = No PT:   OT:   RN:   Equipment:   Other: Resume H/H RN, outpatient cardiac rehab    LOS: 7 days   Cresenciano Genre, MD [phone] 07/23/2014, 10:49 AM

## 2014-07-24 DIAGNOSIS — E119 Type 2 diabetes mellitus without complications: Secondary | ICD-10-CM

## 2014-07-24 LAB — BASIC METABOLIC PANEL
Anion gap: 16 — ABNORMAL HIGH (ref 5–15)
BUN: 78 mg/dL — ABNORMAL HIGH (ref 6–23)
CHLORIDE: 96 meq/L (ref 96–112)
CO2: 24 mEq/L (ref 19–32)
CREATININE: 3.02 mg/dL — AB (ref 0.50–1.35)
Calcium: 9.5 mg/dL (ref 8.4–10.5)
GFR calc Af Amer: 24 mL/min — ABNORMAL LOW (ref >90)
GFR, EST NON AFRICAN AMERICAN: 21 mL/min — AB (ref >90)
Glucose, Bld: 218 mg/dL — ABNORMAL HIGH (ref 70–99)
Potassium: 4.1 mEq/L (ref 3.7–5.3)
SODIUM: 136 meq/L — AB (ref 137–147)

## 2014-07-24 LAB — CBC WITH DIFFERENTIAL/PLATELET
BASOS PCT: 0 % (ref 0–1)
Basophils Absolute: 0 10*3/uL (ref 0.0–0.1)
EOS PCT: 0 % (ref 0–5)
Eosinophils Absolute: 0 10*3/uL (ref 0.0–0.7)
HEMATOCRIT: 27.9 % — AB (ref 39.0–52.0)
HEMOGLOBIN: 9.1 g/dL — AB (ref 13.0–17.0)
Lymphocytes Relative: 4 % — ABNORMAL LOW (ref 12–46)
Lymphs Abs: 0.4 10*3/uL — ABNORMAL LOW (ref 0.7–4.0)
MCH: 29.6 pg (ref 26.0–34.0)
MCHC: 32.6 g/dL (ref 30.0–36.0)
MCV: 90.9 fL (ref 78.0–100.0)
MONO ABS: 1 10*3/uL (ref 0.1–1.0)
Monocytes Relative: 9 % (ref 3–12)
NEUTROS ABS: 9.2 10*3/uL — AB (ref 1.7–7.7)
NEUTROS PCT: 87 % — AB (ref 43–77)
Platelets: 247 10*3/uL (ref 150–400)
RBC: 3.07 MIL/uL — AB (ref 4.22–5.81)
RDW: 14.6 % (ref 11.5–15.5)
WBC: 10.6 10*3/uL — ABNORMAL HIGH (ref 4.0–10.5)

## 2014-07-24 LAB — OCCULT BLOOD X 1 CARD TO LAB, STOOL: Fecal Occult Bld: POSITIVE — AB

## 2014-07-24 LAB — GLUCOSE, CAPILLARY
Glucose-Capillary: 171 mg/dL — ABNORMAL HIGH (ref 70–99)
Glucose-Capillary: 94 mg/dL (ref 70–99)
Glucose-Capillary: 105 mg/dL — ABNORMAL HIGH (ref 70–99)
Glucose-Capillary: 187 mg/dL — ABNORMAL HIGH (ref 70–99)

## 2014-07-24 MED ORDER — CARVEDILOL 3.125 MG PO TABS
3.1250 mg | ORAL_TABLET | Freq: Two times a day (BID) | ORAL | Status: DC
  Filled 2014-07-24 (×2): qty 1

## 2014-07-24 MED ORDER — FUROSEMIDE 10 MG/ML IJ SOLN
80.0000 mg | Freq: Once | INTRAMUSCULAR | Status: AC
  Administered 2014-07-24: 80 mg via INTRAVENOUS
  Filled 2014-07-24: qty 8

## 2014-07-24 MED ORDER — INSULIN ASPART 100 UNIT/ML ~~LOC~~ SOLN
0.0000 [IU] | Freq: Three times a day (TID) | SUBCUTANEOUS | Status: DC
Start: 2014-07-24 — End: 2014-07-26
  Administered 2014-07-25: 3 [IU] via SUBCUTANEOUS
  Administered 2014-07-26: 2 [IU] via SUBCUTANEOUS

## 2014-07-24 MED ORDER — FUROSEMIDE 10 MG/ML IJ SOLN
80.0000 mg | Freq: Three times a day (TID) | INTRAMUSCULAR | Status: DC
  Administered 2014-07-24 – 2014-07-25 (×5): 80 mg via INTRAVENOUS
  Filled 2014-07-24 (×6): qty 8

## 2014-07-24 MED ORDER — INSULIN ASPART 100 UNIT/ML ~~LOC~~ SOLN
6.0000 [IU] | Freq: Three times a day (TID) | SUBCUTANEOUS | Status: DC
  Administered 2014-07-24: 6 [IU] via SUBCUTANEOUS

## 2014-07-24 MED ORDER — METOLAZONE 2.5 MG PO TABS
2.5000 mg | ORAL_TABLET | Freq: Once | ORAL | Status: AC
  Administered 2014-07-24: 2.5 mg via ORAL
  Filled 2014-07-24: qty 1

## 2014-07-24 MED ORDER — SODIUM CHLORIDE 0.9 % IV SOLN
INTRAVENOUS | Status: DC
  Administered 2014-07-25 – 2014-07-28 (×3): via INTRAVENOUS

## 2014-07-24 MED ORDER — SODIUM CHLORIDE 0.9 % IJ SOLN
3.0000 mL | Freq: Two times a day (BID) | INTRAMUSCULAR | Status: DC
  Administered 2014-07-24 – 2014-07-31 (×9): 3 mL via INTRAVENOUS

## 2014-07-24 MED ORDER — SODIUM CHLORIDE 0.9 % IJ SOLN: 3.0000 mL | INTRAMUSCULAR | Status: DC | PRN

## 2014-07-24 MED ORDER — SODIUM CHLORIDE 0.9 % IV SOLN: 250.0000 mL | INTRAVENOUS | Status: DC | PRN

## 2014-07-24 MED ORDER — CARVEDILOL 6.25 MG PO TABS
6.2500 mg | ORAL_TABLET | Freq: Two times a day (BID) | ORAL | Status: DC
  Administered 2014-07-24 – 2014-07-31 (×13): 6.25 mg via ORAL
  Filled 2014-07-24 (×16): qty 1

## 2014-07-24 NOTE — Progress Notes (Signed)
Subjective: [name] has no new concerns today. He denies fevers or chills. His edema has worsened (more on the R than L). Denies shortness of breath or chest pain. His R ankle pain has resolved. He is urinating without difficulty.   Objective: Vital signs in last 24 hours: Filed Vitals:   07/24/14 0435 07/24/14 0651 07/24/14 0754 07/24/14 1044  BP:  131/71  134/83  Pulse:  71  64  Temp:  98 F (36.7 C)    TempSrc:  Oral    Resp:  18    Height:      Weight: 235 lb 11.2 oz (106.913 kg)     SpO2:  97% 96%    Weight change: -10.1 oz (-0.287 kg)  Intake/Output Summary (Last 24 hours) at 07/24/14 1051 Last data filed at 07/24/14 1044  Gross per 24 hour  Intake    240 ml  Output   2295 ml  Net  -2055 ml   General Apperance: NAD  Head: Normocephalic, atraumatic  Eyes: PERRL, EOMI, anicteric sclera  Ears: Normal external ear canal  Nose: Nares normal, septum midline, mucosa normal  Throat: Lips, mucosa and tongue normal  Neck: Supple, trachea midline  Back: No tenderness or bony abnormality  Lungs: Clear to auscultation bilaterally. No wheezes No rhonchi or rales. Breathing comfortably  Chest Wall: Nontender, no deformity  Heart: Irregularly irregular, no murmur/rub/gallop  Abdomen: Soft, nontender, nondistended, no rebound/guarding  Extremities: Normal, atraumatic, warm and well perfused, 2+ pitting edema to BLE to just below knee. Pulses: 2+ throughout  Skin: No rashes or lesions  Neurologic: Alert and oriented x 3. CNII-XII intact. Normal strength and sensation   Lab Results: Basic Metabolic Panel:  Recent Labs Lab 07/19/14 0915  07/23/14 0351 07/24/14 0440  NA 140  < > 134* 136*  K 4.0  < > 4.2 4.1  CL 99  < > 94* 96  CO2 29  < > 24 24  GLUCOSE 188*  < > 207* 218*  BUN 52*  < > 81* 78*  CREATININE 2.77*  < > 3.15* 3.02*  CALCIUM 9.4  < > 9.3 9.5  MG 2.1  --  2.3  --   < > = values in this interval not displayed.   CBC:  Recent Labs Lab  07/19/14 0915 07/24/14 0440  WBC 8.5 10.6*  NEUTROABS  --  9.2*  HGB 9.2* 9.1*  HCT 29.7* 27.9*  MCV 92.5 90.9  PLT 168 247    Cardiac Enzymes:  Recent Labs Lab 07/17/14 1108 07/20/14 0945  TROPONINI <0.30 <0.30    CBG:  Recent Labs Lab 07/22/14 2119 07/23/14 0646 07/23/14 1155 07/23/14 1713 07/23/14 2055 07/24/14 0718  GLUCAP 268* 176* 198* 227* 283* 171*    Studies/Results: No results found.  Medications: I have reviewed the patient's current medications. Scheduled Meds: . amiodarone  400 mg Oral BID  . atorvastatin  40 mg Oral Daily  . carvedilol  25 mg Oral BID WC  . cholecalciferol  2,000 Units Oral Daily  . edoxaban  30 mg Oral Daily  . ferrous sulfate  325 mg Oral BID WC  . furosemide  80 mg Intravenous BID  . hydrALAZINE  50 mg Oral TID  . insulin aspart  0-20 Units Subcutaneous TID WC  . insulin aspart  0-5 Units Subcutaneous QHS  . insulin aspart  6 Units Subcutaneous TID WC  . insulin glargine  30 Units Subcutaneous QHS  . isosorbide mononitrate  60 mg Oral  Daily  . mometasone-formoterol  2 puff Inhalation BID  . multivitamin with minerals  1 tablet Oral Daily  . sodium chloride  3 mL Intravenous Q12H   Continuous Infusions: . milrinone 0.25 mcg/kg/min (07/24/14 0739)   PRN Meds:.acetaminophen, albuterol, HYDROcodone-acetaminophen, ondansetron (ZOFRAN) IV, ramelteon Assessment/Plan: Active Problems:   Chronic kidney disease (CKD), stage IV (severe)   DM (diabetes mellitus), type 2, uncontrolled, with renal complications   Essential hypertension, benign   CAD (coronary artery disease)   Other and unspecified hyperlipidemia   Abnormal renal ultrasound with left renal lesion and bladder wall thickening   PAF (paroxysmal atrial fibrillation)   Lower extremity edema   Hypoglycemia   Acute renal failure superimposed on stage 4 chronic kidney disease   Acute on chronic combined systolic and diastolic heart failure   Syncope    Anemia  Acute exacerbation systolic and diastolic CHF s/p ICD 0/06/2329:  -No ACE-I or ARBs given his renal disease -daily weights, strict i/o's  -continue statin  -continue carvedilol 25mg  BID  -continue IV Lasix 80mg  BID -continue milrinone gtt -?DCCV and/or RHC today per cardiology, will follow up on their recommendations today  A fib: on edoxaban at home. Rate controlled. -continue amiodarone 400mg  BID -continue edoxaban   AKI on CKD, stage IV, history of renal cystic lesion: Cr down to 3.02 from 3.15 -Recommend renal ultrasound in 6-12 months since he cannot obtain an MRI due to his ICD. -rest of plan as above  Gout flare: Resolved -Norco 5/325mg  Q6hr prn pain  Syncope: No recurrence -continue to monitor  Anemia: 2/2 chronic disease with Fe deficiency. Hgb remains stable. FOBT positive. -ferrous sulfate 325mg  BID -Outpatient GI follow up  DM2:  -Accuchecks QID -resistant SSI  -6u mealtime novolog TID -lantus 30u QHS  HTN: normotensive here. On coreg 25mg  BID, lasix 40mg  TID, hydralazine 50mg  TID, Imdur 60mg  daily at home.  -continue hydralazine 50mg  TID  -continue Imdur 60mg  daily  -Lasix as above  -continue Coreg 25mg  BID   Asthma: on dulera 200/5 mcg and albuterol prn at home. -continue albuterol 2 puff Q6hr prn  -continue dulera 200/5 mcg 2 puff BID   FEN: NPO possible procedures  DVT ppx: SCDs, edoxaban  Dispo: Disposition is deferred at this time, awaiting improvement of current medical problems. Anticipated discharge in approximately 3-5 day(s).   The patient does have a current PCP Tanya Nones, MD) and does not need an [facility] hospital follow-up appointment after discharge.  The patient does not have transportation limitations that hinder transportation to clinic appointments.  .Services Needed at time of discharge: Y = Yes, Blank = No PT: Outpatient cardiac rehab  OT:   RN:   Equipment:   Other:     LOS: 8 days   Jacques Earthly,  MD 07/24/2014, 10:51 AM

## 2014-07-24 NOTE — Progress Notes (Signed)
Primary cardiologist: Dr. Pierre Bali  Subjective:    Lasix increased yesterday. Weight down 1 lb, UOP sluggish. Creatinine down slightly, 3.02. Ambulating in the halls with minimal SOB.     Objective:   Temp:  [97.9 F (36.6 C)-98.2 F (36.8 C)] 98 F (36.7 C) (09/28 0651) Pulse Rate:  [64-72] 64 (09/28 1044) Resp:  [16-18] 18 (09/28 0651) BP: (120-134)/(71-83) 134/83 mmHg (09/28 1044) SpO2:  [96 %-99 %] 96 % (09/28 0754) Weight:  [235 lb 11.2 oz (106.913 kg)] 235 lb 11.2 oz (106.913 kg) (09/28 0435) Last BM Date: 07/24/14  Filed Weights   07/22/14 0619 07/23/14 0544 07/24/14 0435  Weight: 235 lb 3.7 oz (106.7 kg) 236 lb 5.3 oz (107.2 kg) 235 lb 11.2 oz (106.913 kg)    Intake/Output Summary (Last 24 hours) at 07/24/14 1142 Last data filed at 07/24/14 1044  Gross per 24 hour  Intake    240 ml  Output   2295 ml  Net  -2055 ml    Telemetry: Rate-controlled atrial fibrillation.  Exam: General: Well appearing. No resp difficulty, sitting up in chair HEENT: normal  Neck: supple. JVP to ear . Carotids 2+ bilat; no bruits. No lymphadenopathy or thryomegaly appreciated.  Cor: PMI nondisplaced. Regular rate & irregular rhythm. No rubs, gallops or murmurs.  Lungs: clear  Abdomen: Obese, soft, nontender, nondistended. No hepatosplenomegaly. No bruits or masses. Good bowel sounds.  Extremities: no cyanosis, clubbing, rash, 2-3+ bilateral edema  Neuro: alert & orientedx3, cranial nerves grossly intact. moves all 4 extremities w/o difficulty. Affect pleasant  Lab Results:  Basic Metabolic Panel:  Recent Labs Lab 07/19/14 0915  07/22/14 0450 07/23/14 0351 07/24/14 0440  NA 140  < > 133* 134* 136*  K 4.0  < > 4.2 4.2 4.1  CL 99  < > 93* 94* 96  CO2 29  < > 24 24 24   GLUCOSE 188*  < > 270* 207* 218*  BUN 52*  < > 78* 81* 78*  CREATININE 2.77*  < > 3.02* 3.15* 3.02*  CALCIUM 9.4  < > 9.2 9.3 9.5  MG 2.1  --   --  2.3  --   < > = values in this interval not  displayed.  Liver Function Tests: No results found for this basename: AST, ALT, ALKPHOS, BILITOT, PROT, ALBUMIN,  in the last 168 hours  CBC:  Recent Labs Lab 07/18/14 1259 07/19/14 0915 07/24/14 0440  WBC 8.3 8.5 10.6*  HGB 9.3* 9.2* 9.1*  HCT 29.4* 29.7* 27.9*  MCV 93.0 92.5 90.9  PLT 187 168 247    Cardiac Enzymes:  Recent Labs Lab 07/20/14 0945  TROPONINI <0.30    BNP:  Recent Labs  07/16/14 1401  PROBNP 12348.0*     Medications:   Scheduled Medications: . amiodarone  400 mg Oral BID  . atorvastatin  40 mg Oral Daily  . carvedilol  25 mg Oral BID WC  . cholecalciferol  2,000 Units Oral Daily  . edoxaban  30 mg Oral Daily  . ferrous sulfate  325 mg Oral BID WC  . furosemide  80 mg Intravenous BID  . hydrALAZINE  50 mg Oral TID  . insulin aspart  0-20 Units Subcutaneous TID WC  . insulin aspart  0-5 Units Subcutaneous QHS  . insulin aspart  6 Units Subcutaneous TID WC  . insulin glargine  30 Units Subcutaneous QHS  . isosorbide mononitrate  60 mg Oral Daily  . mometasone-formoterol  2 puff Inhalation  BID  . multivitamin with minerals  1 tablet Oral Daily  . sodium chloride  3 mL Intravenous Q12H    Infusions: . milrinone 0.25 mcg/kg/min (07/24/14 0739)    PRN Medications: acetaminophen, albuterol, HYDROcodone-acetaminophen, ondansetron (ZOFRAN) IV, ramelteon   Assessment/Plan/Discussion:    1. 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 Currently on milrinone. He remains volume overloaded will increase lasix to 80 mg TID with a dose of 2.5 mg metolazone. Will cut coreg back from 25 mg BID to 6.25 mg BID with massive volume overload and being on milrinone.  - No ACE-I or ARB or spironolactone with CKD. - Continue hydralazine 50 mg TID and Imdur 60 mg daily. - Will need to be set up for RHC will look at Dr. Claris Gladden schedule to figure out when is best. Would like to turn milrinone off the night before cath to see what CO is and whether he  qualifies for home milrinone.   2. Persistent atrial fibrillation, oral amiodarone load 400 mg twice daily for now. On Savaysa. Will need to schedule TEE-DC-CV. Concern with patient being on Savaysa is when he runs out of trial pack, how is he going to afford medication. CM following.   3. Acute on chronic renal insufficiency with CKD stage IV. Creatinine stable at 3.0.  4. Chronic anemia.  5. History of CAD status post multiple PCI's, no active angina.  Junie Bame B NP-C 12:09 PM  Patient seen with NP, agree with the above note 1. Acute on chronic systolic CHF: Primarily ischemic cardiomyopathy.  CHF likely worsened by atrial fibrillation.  He remains quite volume overloaded on exam, creatinine stable at 3.  - Increase Lasix 80 mg IV every 8 hrs and will give 1 dose metolazone today.  - Continue hydralazine/Imdur - Continue milrinone at current dose, will decrease Coreg to 6.25 mg bid.  - Will need RHC in am (will hold milrinone for procedure) to assess filling pressure and output.   2. Atrial fibrillation: Remains in atrial fibrillation on edoxaban.  HR controlled, he is on amiodarone.  As above, decreased Coreg to 6.25 mg bid.  Will plan TEE-guided DCCV once he is more diuresed.  3. AKI on CKD: Creatinine now stable at 3.  Will need to follow closely.  He has fluctuated 2.5-3+ over the last few months. 4. CAD: Stable. Continue statin.   [name] 07/24/2014 2:22 PM

## 2014-07-24 NOTE — Progress Notes (Signed)
Inpatient Diabetes Program Recommendations  AACE/ADA: New Consensus Statement on Inpatient Glycemic Control (2013)  Target Ranges:  Prepandial:   less than 140 mg/dL      Peak postprandial:   less than 180 mg/dL (1-2 hours)      Critically ill patients:  140 - 180 mg/dL  Results for COWEN, PESQUEIRA (MRN [id number]) as of 07/24/2014 13:31  Ref. Range 07/23/2014 11:55 07/23/2014 17:13 07/23/2014 20:55 07/24/2014 07:18 07/24/2014 10:58  Glucose-Capillary Latest Range: 70-99 mg/dL 198 (H) 227 (H) 283 (H) 171 (H) 94   Inpatient Diabetes Program Recommendations Correction (SSI): Decrease Novolog correction scale to moderate since steroid has been dc'd Thank you  Raoul Pitch BSN, RN,CDE Inpatient Diabetes Coordinator [phone] (team pager)

## 2014-07-24 NOTE — Progress Notes (Signed)
  Date: 07/24/2014  Patient name: Tony Freeman  Medical record number: [id number]  Date of birth: 1951-04-19   This patient has been seen and the plan of care was discussed with the house staff. Please see their note for complete details. I concur with their findings with the following additions/corrections:  Per Cardiology note, plan for RHC in the AM with holding milrinone.  Will defer hold time to the Cardiology team.  TEE with cardioversion once he is further diuresed.   Sid Falcon, MD 07/24/2014, 3:32 PM

## 2014-07-24 NOTE — Progress Notes (Signed)
Physical Therapy Treatment Patient Details Name: Tony Freeman MRN: [id number] DOB: 1951-02-11 Today's Date: 07/24/2014    History of Present Illness Pt admitted with CHF exacerbation and R ankle gout.    PT Comments    Pt progressing towards physical therapy goals, however is showing an overall decrease in strength and balance compared to PT eval. Encouraged pt to walk with nursing as he felt able, and now recommending HHPT to follow up at d/c. Pt appears to have a decreased awareness of deficits/severity of illness. Declines RW at beginning of session however had LOB at EOB, and does not want to take rest breaks when breathing becomes short. Pt was educated in importance of listening to his body and judging what kind of activity is appropriate according to the way he is feeling. Will continue to follow and progress as able per POC.   Follow Up Recommendations  Home health PT;Supervision for mobility/OOB     Equipment Recommendations  Rolling walker with 5" wheels    Recommendations for Other Services       Precautions / Restrictions Precautions Precautions: Fall Restrictions Weight Bearing Restrictions: No    Mobility  Bed Mobility Overal bed mobility: Modified Independent             General bed mobility comments: Pt demonstrated proper hand placement and safety awareness.  Transfers Overall transfer level: Modified independent Equipment used: Rolling walker (2 wheeled);None Transfers: Sit to/from Stand Sit to Stand: Min assist         General transfer comment: Pt somewhat impulsive in initiating first transfer to standing, requiring min assist to recover after LOB to the right. Pt was cued to sit back onto the bed and RW was used for the rest of session for balance and energy conservation.   Ambulation/Gait Ambulation/Gait assistance: Min guard Ambulation Distance (Feet): 400 Feet Assistive device: Rolling walker (2 wheeled) Gait Pattern/deviations:  Step-through pattern;Decreased stride length;Trunk flexed Gait velocity: Decreased Gait velocity interpretation: Below normal speed for age/gender General Gait Details: VC's for pursed-lip breathing and encouraged standing rest breaks if pt was beginning to feel SOB. Breathing appeared somewhat labored at times however pt denied any shortness of breath and declined standing rest breaks.    Stairs            Wheelchair Mobility    Modified Rankin (Stroke Patients Only)       Balance Overall balance assessment: Needs assistance Sitting-balance support: Feet supported;No upper extremity supported Sitting balance-Leahy Scale: Good     Standing balance support: No upper extremity supported Standing balance-Leahy Scale: Poor Standing balance comment: Required min assist to prevent fall upon sit>stand. No assist required with RW.                     Cognition Arousal/Alertness: Awake/alert Behavior During Therapy: WFL for tasks assessed/performed Overall Cognitive Status: Within Functional Limits for tasks assessed                      Exercises General Exercises - Lower Extremity Ankle Circles/Pumps: 20 reps Quad Sets: 10 reps Long Arc Quad: 10 reps Hip ABduction/ADduction: 10 reps    General Comments        Pertinent Vitals/Pain Pain Assessment: No/denies pain    Home Living                      Prior Function            PT  Goals (current goals can now be found in the care plan section) Acute Rehab PT Goals Patient Stated Goal: home with family PT Goal Formulation: With patient Time For Goal Achievement: 07/27/14 Potential to Achieve Goals: Good Progress towards PT goals: Progressing toward goals    Frequency  Min 3X/week    PT Plan Current plan remains appropriate    Co-evaluation             End of Session Equipment Utilized During Treatment: Gait belt Activity Tolerance: Patient tolerated treatment well Patient  left: in chair;with call bell/phone within reach     Time: 1130-1204 PT Time Calculation (min): 34 min  Charges:  $Gait Training: 8-22 mins $Therapeutic Exercise: 8-22 mins                    G Codes:      Jolyn Lent 08/17/2014, 1:47 PM  Jolyn Lent, PT, DPT Acute Rehabilitation Services Pager: [phone]

## 2014-07-25 ENCOUNTER — Encounter (HOSPITAL_COMMUNITY)
Admission: EM | Disposition: A | Discharge status: Home Health | Payer: Self-pay | Source: Home / Self Care | Attending: Internal Medicine

## 2014-07-25 DIAGNOSIS — I509 Heart failure, unspecified: Secondary | ICD-10-CM

## 2014-07-25 HISTORY — PX: RIGHT HEART CATHETERIZATION: SHX5447

## 2014-07-25 LAB — CBC WITH DIFFERENTIAL/PLATELET
BASOS ABS: 0 10*3/uL (ref 0.0–0.1)
BASOS PCT: 0 % (ref 0–1)
EOS PCT: 2 % (ref 0–5)
Eosinophils Absolute: 0.2 10*3/uL (ref 0.0–0.7)
HEMATOCRIT: 31.6 % — AB (ref 39.0–52.0)
HEMOGLOBIN: 10.2 g/dL — AB (ref 13.0–17.0)
Lymphocytes Relative: 11 % — ABNORMAL LOW (ref 12–46)
Lymphs Abs: 1.2 10*3/uL (ref 0.7–4.0)
MCH: 28.9 pg (ref 26.0–34.0)
MCHC: 32.3 g/dL (ref 30.0–36.0)
MCV: 89.5 fL (ref 78.0–100.0)
MONOS PCT: 12 % (ref 3–12)
Monocytes Absolute: 1.2 10*3/uL — ABNORMAL HIGH (ref 0.1–1.0)
Neutro Abs: 8 10*3/uL — ABNORMAL HIGH (ref 1.7–7.7)
Neutrophils Relative %: 75 % (ref 43–77)
Platelets: 220 10*3/uL (ref 150–400)
RBC: 3.53 MIL/uL — ABNORMAL LOW (ref 4.22–5.81)
RDW: 14.9 % (ref 11.5–15.5)
WBC: 10.7 10*3/uL — ABNORMAL HIGH (ref 4.0–10.5)

## 2014-07-25 LAB — BASIC METABOLIC PANEL
ANION GAP: 15 (ref 5–15)
BUN: 81 mg/dL — ABNORMAL HIGH (ref 6–23)
CALCIUM: 9.9 mg/dL (ref 8.4–10.5)
CO2: 29 mEq/L (ref 19–32)
Chloride: 94 mEq/L — ABNORMAL LOW (ref 96–112)
Creatinine, Ser: 3.09 mg/dL — ABNORMAL HIGH (ref 0.50–1.35)
GFR calc Af Amer: 23 mL/min — ABNORMAL LOW (ref >90)
GFR, EST NON AFRICAN AMERICAN: 20 mL/min — AB (ref >90)
Glucose, Bld: 103 mg/dL — ABNORMAL HIGH (ref 70–99)
Potassium: 3.6 mEq/L — ABNORMAL LOW (ref 3.7–5.3)
SODIUM: 138 meq/L (ref 137–147)

## 2014-07-25 LAB — PROTIME-INR
INR: 1.6 — ABNORMAL HIGH (ref 0.00–1.49)
Prothrombin Time: 19.1 seconds — ABNORMAL HIGH (ref 11.6–15.2)

## 2014-07-25 LAB — GLUCOSE, CAPILLARY
Glucose-Capillary: 80 mg/dL (ref 70–99)
Glucose-Capillary: 97 mg/dL (ref 70–99)
Glucose-Capillary: 168 mg/dL — ABNORMAL HIGH (ref 70–99)
Glucose-Capillary: 284 mg/dL — ABNORMAL HIGH (ref 70–99)

## 2014-07-25 LAB — POCT I-STAT 3, VENOUS BLOOD GAS (G3P V)
Acid-Base Excess: 7 mmol/L — ABNORMAL HIGH (ref 0.0–2.0)
BICARBONATE: 32.6 meq/L — AB (ref 20.0–24.0)
O2 Saturation: 52 %
TCO2: 34 mmol/L (ref 0–100)
pCO2, Ven: 48.5 mmHg (ref 45.0–50.0)
pH, Ven: 7.435 — ABNORMAL HIGH (ref 7.250–7.300)
pO2, Ven: 27 mmHg — CL (ref 30.0–45.0)

## 2014-07-25 SURGERY — RIGHT HEART CATH
Anesthesia: LOCAL

## 2014-07-25 MED ORDER — MIDAZOLAM HCL 5 MG/5ML IJ SOLN
INTRAMUSCULAR | Status: AC
  Filled 2014-07-25: qty 5

## 2014-07-25 MED ORDER — FENTANYL CITRATE 0.05 MG/ML IJ SOLN
INTRAMUSCULAR | Status: AC
  Filled 2014-07-25: qty 2

## 2014-07-25 MED ORDER — POTASSIUM CHLORIDE 10 MEQ/100ML IV SOLN
10.0000 meq | INTRAVENOUS | Status: AC
  Administered 2014-07-25: 10 meq via INTRAVENOUS
  Filled 2014-07-25 (×2): qty 100

## 2014-07-25 MED ORDER — SODIUM CHLORIDE 0.9 % IV SOLN: 250.0000 mL | INTRAVENOUS | Status: DC | PRN

## 2014-07-25 MED ORDER — ONDANSETRON HCL 4 MG/2ML IJ SOLN
4.0000 mg | Freq: Four times a day (QID) | INTRAMUSCULAR | Status: DC | PRN
Start: 2014-07-25 — End: 2014-07-31

## 2014-07-25 MED ORDER — SODIUM CHLORIDE 0.9 % IJ SOLN: 3.0000 mL | INTRAMUSCULAR | Status: DC | PRN

## 2014-07-25 MED ORDER — SODIUM CHLORIDE 0.9 % IJ SOLN: 3.0000 mL | Freq: Two times a day (BID) | INTRAMUSCULAR | Status: DC

## 2014-07-25 MED ORDER — LIDOCAINE HCL (PF) 1 % IJ SOLN
INTRAMUSCULAR | Status: AC
  Filled 2014-07-25: qty 30

## 2014-07-25 MED ORDER — ACETAMINOPHEN 325 MG PO TABS
650.0000 mg | ORAL_TABLET | ORAL | Status: DC | PRN
  Administered 2014-07-28 – 2014-07-30 (×2): 650 mg via ORAL
  Filled 2014-07-25 (×2): qty 2

## 2014-07-25 MED ORDER — METOLAZONE 2.5 MG PO TABS
2.5000 mg | ORAL_TABLET | Freq: Once | ORAL | Status: AC
  Administered 2014-07-25: 2.5 mg via ORAL
  Filled 2014-07-25 (×2): qty 1

## 2014-07-25 MED ORDER — MILRINONE IN DEXTROSE 20 MG/100ML IV SOLN
0.1250 ug/kg/min | INTRAVENOUS | Status: DC
  Administered 2014-07-25 – 2014-07-29 (×8): 0.25 ug/kg/min via INTRAVENOUS
  Administered 2014-07-29 – 2014-07-30 (×2): 0.125 ug/kg/min via INTRAVENOUS
  Filled 2014-07-25 (×9): qty 100

## 2014-07-25 MED ORDER — HEPARIN (PORCINE) IN NACL 2-0.9 UNIT/ML-% IJ SOLN
INTRAMUSCULAR | Status: AC
  Filled 2014-07-25: qty 500

## 2014-07-25 MED ORDER — POTASSIUM CHLORIDE CRYS ER 20 MEQ PO TBCR
40.0000 meq | EXTENDED_RELEASE_TABLET | Freq: Once | ORAL | Status: AC
  Administered 2014-07-25: 40 meq via ORAL
  Filled 2014-07-25: qty 2

## 2014-07-25 NOTE — CV Procedure (Signed)
    Cardiac Catheterization Procedure Note  Name: Tony Freeman MRN: [id number] DOB: 1951-07-08  Procedure: Right Heart Cath  Indication: Acute CHF   Procedural Details: The right brachial area was prepped and draped.  There was a pre-existing peripheral IV.  This was exchanged out for a 5 French venous sheath. A Swan-Ganz catheter was used for the right heart catheterization. Standard protocol was followed for recording of right heart pressures and sampling of oxygen saturations. Fick cardiac output was calculated. There were no immediate procedural complications. The patient was transferred to the post catheterization recovery area for further monitoring.  Procedural Findings:  Patient had a tortuous venous system, and despite use of Swan wire, I was unable to move the catheter beyond the right atrium.  Hemodynamics (mmHg)  RA mean 20  Oxygen saturations: RA 52% AO 97%  Cardiac Output (Fick) 4.52  Cardiac Index (Fick) 2.13   Final Conclusions:  Elevated right heart filling pressure.  As noted above, with tortuous venous system, I was unable to advance catheter beyond the RA despite use of Swan wire.  CI is low (but not markedly low) off milrinone.   Recommendations: Patient needs further diuresis.  Will restart milrinone and continue diuresis today, adding metolazone 2.5 x 1 today.  Creatinine stable.  Will need TEE-guided DCCV later this week.  Continue edoxaban.   [name] 07/25/2014, 8:58 AM

## 2014-07-25 NOTE — Progress Notes (Signed)
Subjective: Mr. Codispoti reports he is doing well today. He denies sob or chest pain. No LOC. No difficulty urinating.  Objective: Vital signs in last 24 hours: Filed Vitals:   07/25/14 0923 07/25/14 0936 07/25/14 0945 07/25/14 1002  BP: 148/84 136/77 132/87 125/80  Pulse: 58 61 61 61  Temp:      TempSrc:      Resp:   18   Height:      Weight:      SpO2: 100%  98%    Weight change: -7 lb 8 oz (-3.402 kg)  Intake/Output Summary (Last 24 hours) at 07/25/14 1043 Last data filed at 07/25/14 1005  Gross per 24 hour  Intake 1053.08 ml  Output   4800 ml  Net -3746.92 ml   General Apperance: NAD  Head: Normocephalic, atraumatic  Eyes: PERRL, EOMI, anicteric sclera  Ears: Normal external ear canal  Nose: Nares normal, septum midline, mucosa normal  Throat: Lips, mucosa and tongue normal  Neck: Supple, trachea midline  Back: No tenderness or bony abnormality  Lungs: Clear to auscultation bilaterally. No wheezes No rhonchi or rales. Breathing comfortably  Chest Wall: Nontender, no deformity  Heart: Irregularly irregular, no murmur/rub/gallop  Abdomen: Soft, nontender, nondistended, no rebound/guarding  Extremities: Normal, atraumatic, warm and well perfused, 2+ pitting edema to BLE to just below knee. Pulses: 2+ throughout  Skin: No rashes or lesions  Neurologic: Alert and oriented x 3. CNII-XII intact. Normal strength and sensation   Lab Results: Basic Metabolic Panel:  Recent Labs Lab 07/19/14 0915  07/23/14 0351 07/24/14 0440 07/25/14 0422  NA 140  < > 134* 136* 138  K 4.0  < > 4.2 4.1 3.6*  CL 99  < > 94* 96 94*  CO2 29  < > 24 24 29   GLUCOSE 188*  < > 207* 218* 103*  BUN 52*  < > 81* 78* 81*  CREATININE 2.77*  < > 3.15* 3.02* 3.09*  CALCIUM 9.4  < > 9.3 9.5 9.9  MG 2.1  --  2.3  --   --   < > = values in this interval not displayed.   CBC:  Recent Labs Lab 07/24/14 0440 07/25/14 0422  WBC 10.6* 10.7*  NEUTROABS 9.2* 8.0*  HGB 9.1* 10.2*  HCT  27.9* 31.6*  MCV 90.9 89.5  PLT 247 220    Cardiac Enzymes:  Recent Labs Lab 07/20/14 0945  TROPONINI <0.30    CBG:  Recent Labs Lab 07/23/14 2055 07/24/14 0718 07/24/14 1058 07/24/14 1625 07/24/14 2107 07/25/14 0556  GLUCAP 283* 171* 94 105* 187* 80    Studies/Results: No results found.  Medications: I have reviewed the patient's current medications. Scheduled Meds: . amiodarone  400 mg Oral BID  . atorvastatin  40 mg Oral Daily  . carvedilol  6.25 mg Oral BID WC  . cholecalciferol  2,000 Units Oral Daily  . edoxaban  30 mg Oral Daily  . ferrous sulfate  325 mg Oral BID WC  . furosemide  80 mg Intravenous TID  . hydrALAZINE  50 mg Oral TID  . insulin aspart  0-15 Units Subcutaneous TID WC  . insulin aspart  0-5 Units Subcutaneous QHS  . insulin glargine  30 Units Subcutaneous QHS  . isosorbide mononitrate  60 mg Oral Daily  . metolazone  2.5 mg Oral Once  . mometasone-formoterol  2 puff Inhalation BID  . multivitamin with minerals  1 tablet Oral Daily  . sodium chloride  3 mL  Intravenous Q12H  . sodium chloride  3 mL Intravenous Q12H  . sodium chloride  3 mL Intravenous Q12H   Continuous Infusions: . sodium chloride 15 mL/hr at 07/25/14 0636  . milrinone 0.25 mcg/kg/min (07/25/14 0929)   PRN Meds:.sodium chloride, sodium chloride, acetaminophen, albuterol, HYDROcodone-acetaminophen, ondansetron (ZOFRAN) IV, ramelteon, sodium chloride, sodium chloride Assessment/Plan: Active Problems:   Chronic kidney disease (CKD), stage IV (severe)   DM (diabetes mellitus), type 2, uncontrolled, with renal complications   Essential hypertension, benign   CAD (coronary artery disease)   Other and unspecified hyperlipidemia   Abnormal renal ultrasound with left renal lesion and bladder wall thickening   PAF (paroxysmal atrial fibrillation)   Lower extremity edema   Hypoglycemia   Acute renal failure superimposed on stage 4 chronic kidney disease   Acute on chronic  combined systolic and diastolic heart failure   Syncope   Anemia  Acute exacerbation systolic and diastolic CHF s/p ICD 11/29/3005, s/p RHC 9/29: Tortuous venous system, RA 54mmHg, CI 2.13. -No ACE-I or ARBs given his renal disease -daily weights, strict i/o's  -continue statin  -continue carvedilol 25mg  BID  -continue IV Lasix 80mg  BID, 1 dose metolazone today -continue milrinone gtt  A fib: on edoxaban at home. Rate controlled. -continue amiodarone 400mg  BID -continue edoxaban  -DCCV per cardiology  AKI on CKD, stage IV, history of renal cystic lesion: Cr stable at 3.09 -Recommend renal ultrasound in 6-12 months since he cannot obtain an MRI due to his ICD. -rest of plan as above  Gout flare: Resolved -Norco 5/325mg  Q6hr prn pain  Syncope: No recurrence -continue to monitor  Anemia: 2/2 chronic disease with Fe deficiency. FOBT positive. -ferrous sulfate 325mg  BID -Outpatient GI follow up  DM2:  -Accuchecks QID -moderate SSI  -d/c 6u mealtime novolog TID -lantus 30u QHS  HTN: normotensive here. On coreg 25mg  BID, lasix 40mg  TID, hydralazine 50mg  TID, Imdur 60mg  daily at home.  -continue hydralazine 50mg  TID  -continue Imdur 60mg  daily  -Lasix as above  -continue Coreg 25mg  BID   Asthma: on dulera 200/5 mcg and albuterol prn at home. -continue albuterol 2 puff Q6hr prn  -continue dulera 200/5 mcg 2 puff BID   FEN: H/H, carb modified  DVT ppx: SCDs, edoxaban  Dispo: Disposition is deferred at this time, awaiting improvement of current medical problems. Anticipated discharge in approximately 3-5 day(s).   The patient does have a current PCP Tanya Nones, MD) and does not need an [facility] hospital follow-up appointment after discharge.  The patient does not have transportation limitations that hinder transportation to clinic appointments.  .Services Needed at time of discharge: Y = Yes, Blank = No PT: Outpatient cardiac rehab  OT:   RN:   Equipment:   Other:       LOS: 9 days   Jacques Earthly, MD 07/25/2014, 10:43 AM

## 2014-07-25 NOTE — Progress Notes (Signed)
Patient ID: Decklyn Hyder, male   DOB: 1951/04/09, 63 y.o.   MRN: [id number]    Subjective:    Patient diuresed well yesterday, had a dose of metolazone.  Weight down 7 lbs.  Breathing better.  Remains in rate-controlled atrial fibrillation. Creatinine stable.   RHC (9/29): Tortuous venous system, unable to pass Swan despite wire use beyond RA.  Did not switch to leg as he is fully anticoagulated.  RA 20 mmHg CI 2.13 (using RA sat)   Objective:   Temp:  [97.6 F (36.4 C)-98.6 F (37 C)] 97.6 F (36.4 C) (09/29 0429) Pulse Rate:  [58-72] 58 (09/29 0429) Resp:  [18] 18 (09/29 0429) BP: (106-139)/(70-83) 120/78 mmHg (09/29 0429) SpO2:  [97 %-99 %] 99 % (09/29 0429) Weight:  [228 lb 3.2 oz (103.511 kg)] 228 lb 3.2 oz (103.511 kg) (09/29 0429) Last BM Date: 07/24/14  Filed Weights   07/23/14 0544 07/24/14 0435 07/25/14 0429  Weight: 236 lb 5.3 oz (107.2 kg) 235 lb 11.2 oz (106.913 kg) 228 lb 3.2 oz (103.511 kg)    Intake/Output Summary (Last 24 hours) at 07/25/14 0903 Last data filed at 07/25/14 0700  Gross per 24 hour  Intake 1053.08 ml  Output   4650 ml  Net -3596.92 ml    Telemetry: Rate-controlled atrial fibrillation.  Exam: General: Well appearing. No resp difficulty, sitting up in chair HEENT: normal  Neck: supple. JVP to ear. Carotids 2+ bilat; no bruits. No lymphadenopathy or thryomegaly appreciated.  Cor: PMI nondisplaced. Irregular S1S2. No rubs, gallops or murmurs.  Lungs: clear  Abdomen: Obese, soft, nontender, nondistended. No hepatosplenomegaly. No bruits or masses. Good bowel sounds.  Extremities: no cyanosis, clubbing, rash, 2-3+ bilateral edema  Neuro: alert & orientedx3, cranial nerves grossly intact. moves all 4 extremities w/o difficulty. Affect pleasant  Lab Results:  Basic Metabolic Panel:  Recent Labs Lab 07/19/14 0915  07/23/14 0351 07/24/14 0440 07/25/14 0422  NA 140  < > 134* 136* 138  K 4.0  < > 4.2 4.1 3.6*  CL 99  < > 94* 96  94*  CO2 29  < > 24 24 29   GLUCOSE 188*  < > 207* 218* 103*  BUN 52*  < > 81* 78* 81*  CREATININE 2.77*  < > 3.15* 3.02* 3.09*  CALCIUM 9.4  < > 9.3 9.5 9.9  MG 2.1  --  2.3  --   --   < > = values in this interval not displayed.  Liver Function Tests: No results found for this basename: AST, ALT, ALKPHOS, BILITOT, PROT, ALBUMIN,  in the last 168 hours  CBC:  Recent Labs Lab 07/19/14 0915 07/24/14 0440 07/25/14 0422  WBC 8.5 10.6* 10.7*  HGB 9.2* 9.1* 10.2*  HCT 29.7* 27.9* 31.6*  MCV 92.5 90.9 89.5  PLT 168 247 220    Cardiac Enzymes:  Recent Labs Lab 07/20/14 0945  TROPONINI <0.30    BNP:  Recent Labs  07/16/14 1401  PROBNP 12348.0*     Medications:   Scheduled Medications: . amiodarone  400 mg Oral BID  . atorvastatin  40 mg Oral Daily  . carvedilol  6.25 mg Oral BID WC  . cholecalciferol  2,000 Units Oral Daily  . edoxaban  30 mg Oral Daily  . ferrous sulfate  325 mg Oral BID WC  . furosemide  80 mg Intravenous TID  . hydrALAZINE  50 mg Oral TID  . insulin aspart  0-15 Units Subcutaneous TID WC  .  insulin aspart  0-5 Units Subcutaneous QHS  . insulin glargine  30 Units Subcutaneous QHS  . isosorbide mononitrate  60 mg Oral Daily  . metolazone  2.5 mg Oral Once  . mometasone-formoterol  2 puff Inhalation BID  . multivitamin with minerals  1 tablet Oral Daily  . potassium chloride  40 mEq Oral Once  . sodium chloride  3 mL Intravenous Q12H  . sodium chloride  3 mL Intravenous Q12H    Infusions: . sodium chloride 15 mL/hr at 07/25/14 0636  . milrinone      PRN Medications: sodium chloride, acetaminophen, albuterol, HYDROcodone-acetaminophen, ondansetron (ZOFRAN) IV, ramelteon, sodium chloride   Assessment/Plan/Discussion:   1. Acute on chronic systolic CHF: Primarily ischemic cardiomyopathy.  CHF likely worsened by atrial fibrillation.  He remains quite volume overloaded on exam, creatinine stable at 3. RHC showed RA pressure 20 on RHC with  CI low but not markedly so off milrinone.  Needs more diuresis.  - Restart milrinone while diuresing.  - Continue current Lasix and will give 1 dose metolazone again today.  - Continue hydralazine/Imdur and current Coreg. 2. Atrial fibrillation: Remains in atrial fibrillation on edoxaban.  HR controlled, he is on amiodarone or Coreg.  Will keep NPO overnight and reassess in morning.  If his volume status looks better and he diureses well, may hold milrinone and proceed with TEE-guided DCCV tomorrow.  Otherwise, will diurese another day and shoot for Thursday.  3. AKI on CKD: Creatinine now stable at 3.  Will need to follow closely.  He has fluctuated 2.5-3+ over the last few months. 4. CAD: Stable. Continue statin.   [name] 07/25/2014 9:03 AM

## 2014-07-25 NOTE — Progress Notes (Signed)
In at bedside to provide [facility] consult.  Patient was eating breakfast.  Will return at later time to follow up.  Of note, [facility] Care Management services does not replace or interfere with any services that are arranged by inpatient case management or social work.  For additional questions or referrals please contact Corliss Blacker BSN RN [facility] Liaison at [phone].

## 2014-07-25 NOTE — Progress Notes (Signed)
  Date: 07/25/2014  Patient name: Tony Freeman  Medical record number: [id number]  Date of birth: 1951-02-06   This patient has been seen and the plan of care was discussed with the house staff. Please see their note for complete details. I concur with their findings with the following additions/corrections:  Patient with RHC today as noted in Dr. Claris Gladden note.  Plan for TEE-DCCV tomorrow or Thursday.  Continue with diuresis and will receive metolazone again today.  As patient has been on and off a diet while having procedures, agree with changing insulin to avoid hypoglycemic episodes.   Sid Falcon, MD 07/25/2014, 11:31 AM

## 2014-07-25 NOTE — Progress Notes (Signed)
Pt potassium 3.6 this AM. MD on call notified.

## 2014-07-26 ENCOUNTER — Encounter (HOSPITAL_COMMUNITY): Payer: Self-pay | Admitting: Gastroenterology

## 2014-07-26 ENCOUNTER — Inpatient Hospital Stay (HOSPITAL_COMMUNITY): Payer: Medicare HMO | Admitting: Anesthesiology

## 2014-07-26 ENCOUNTER — Encounter (HOSPITAL_COMMUNITY)
Admission: EM | Disposition: A | Discharge status: Home Health | Payer: Self-pay | Source: Home / Self Care | Attending: Internal Medicine

## 2014-07-26 ENCOUNTER — Encounter (HOSPITAL_COMMUNITY): Payer: Medicare HMO | Admitting: Anesthesiology

## 2014-07-26 DIAGNOSIS — I059 Rheumatic mitral valve disease, unspecified: Secondary | ICD-10-CM

## 2014-07-26 DIAGNOSIS — I4891 Unspecified atrial fibrillation: Secondary | ICD-10-CM

## 2014-07-26 HISTORY — PX: TEE WITHOUT CARDIOVERSION: SHX5443

## 2014-07-26 HISTORY — PX: CARDIOVERSION: SHX1299

## 2014-07-26 LAB — BASIC METABOLIC PANEL
Anion gap: 13 (ref 5–15)
BUN: 81 mg/dL — AB (ref 6–23)
CHLORIDE: 93 meq/L — AB (ref 96–112)
CO2: 33 mEq/L — ABNORMAL HIGH (ref 19–32)
Calcium: 9.7 mg/dL (ref 8.4–10.5)
Creatinine, Ser: 3.14 mg/dL — ABNORMAL HIGH (ref 0.50–1.35)
GFR calc Af Amer: 23 mL/min — ABNORMAL LOW (ref >90)
GFR calc non Af Amer: 20 mL/min — ABNORMAL LOW (ref >90)
GLUCOSE: 138 mg/dL — AB (ref 70–99)
POTASSIUM: 3.1 meq/L — AB (ref 3.7–5.3)
Sodium: 139 mEq/L (ref 137–147)

## 2014-07-26 LAB — CBC
HEMATOCRIT: 30.4 % — AB (ref 39.0–52.0)
HEMOGLOBIN: 9.8 g/dL — AB (ref 13.0–17.0)
MCH: 28.6 pg (ref 26.0–34.0)
MCHC: 32.2 g/dL (ref 30.0–36.0)
MCV: 88.6 fL (ref 78.0–100.0)
Platelets: 216 10*3/uL (ref 150–400)
RBC: 3.43 MIL/uL — ABNORMAL LOW (ref 4.22–5.81)
RDW: 14.8 % (ref 11.5–15.5)
WBC: 9.7 10*3/uL (ref 4.0–10.5)

## 2014-07-26 LAB — GLUCOSE, CAPILLARY
GLUCOSE-CAPILLARY: 104 mg/dL — AB (ref 70–99)
Glucose-Capillary: 125 mg/dL — ABNORMAL HIGH (ref 70–99)
Glucose-Capillary: 53 mg/dL — ABNORMAL LOW (ref 70–99)
Glucose-Capillary: 111 mg/dL — ABNORMAL HIGH (ref 70–99)
Glucose-Capillary: 62 mg/dL — ABNORMAL LOW (ref 70–99)
Glucose-Capillary: 226 mg/dL — ABNORMAL HIGH (ref 70–99)

## 2014-07-26 LAB — PROTIME-INR
INR: 2.09 — ABNORMAL HIGH (ref 0.00–1.49)
Prothrombin Time: 23.5 seconds — ABNORMAL HIGH (ref 11.6–15.2)

## 2014-07-26 SURGERY — ECHOCARDIOGRAM, TRANSESOPHAGEAL
Anesthesia: Monitor Anesthesia Care

## 2014-07-26 MED ORDER — POTASSIUM CHLORIDE CRYS ER 20 MEQ PO TBCR
30.0000 meq | EXTENDED_RELEASE_TABLET | Freq: Two times a day (BID) | ORAL | Status: AC
  Administered 2014-07-26 (×2): 30 meq via ORAL
  Filled 2014-07-26 (×2): qty 1

## 2014-07-26 MED ORDER — FUROSEMIDE 10 MG/ML IJ SOLN: 80.0000 mg | Freq: Three times a day (TID) | INTRAMUSCULAR | Status: DC

## 2014-07-26 MED ORDER — INSULIN ASPART 100 UNIT/ML ~~LOC~~ SOLN
0.0000 [IU] | Freq: Three times a day (TID) | SUBCUTANEOUS | Status: DC
  Administered 2014-07-27: 2 [IU] via SUBCUTANEOUS
  Administered 2014-07-27: 3 [IU] via SUBCUTANEOUS
  Administered 2014-07-27: 1 [IU] via SUBCUTANEOUS
  Administered 2014-07-28: 5 [IU] via SUBCUTANEOUS
  Administered 2014-07-28: 3 [IU] via SUBCUTANEOUS
  Administered 2014-07-28: 2 [IU] via SUBCUTANEOUS
  Administered 2014-07-29: 3 [IU] via SUBCUTANEOUS
  Administered 2014-07-29 – 2014-07-30 (×2): 2 [IU] via SUBCUTANEOUS
  Administered 2014-07-30: 5 [IU] via SUBCUTANEOUS
  Administered 2014-07-31: 1 [IU] via SUBCUTANEOUS

## 2014-07-26 MED ORDER — SODIUM CHLORIDE 0.9 % IJ SOLN
3.0000 mL | Freq: Two times a day (BID) | INTRAMUSCULAR | Status: DC
  Administered 2014-07-26 – 2014-07-31 (×7): 3 mL via INTRAVENOUS

## 2014-07-26 MED ORDER — DEXTROSE 50 % IV SOLN
INTRAVENOUS | Status: AC
  Filled 2014-07-26: qty 50

## 2014-07-26 MED ORDER — FUROSEMIDE 10 MG/ML IJ SOLN
80.0000 mg | Freq: Three times a day (TID) | INTRAMUSCULAR | Status: DC
  Administered 2014-07-26 – 2014-07-28 (×6): 80 mg via INTRAVENOUS
  Filled 2014-07-26 (×6): qty 8

## 2014-07-26 MED ORDER — DEXTROSE 50 % IV SOLN
INTRAVENOUS | Status: AC
  Administered 2014-07-26: 50 mL
  Filled 2014-07-26: qty 50

## 2014-07-26 MED ORDER — BUTAMBEN-TETRACAINE-BENZOCAINE 2-2-14 % EX AERO
INHALATION_SPRAY | CUTANEOUS | Status: DC | PRN
  Administered 2014-07-26: 2 via TOPICAL

## 2014-07-26 MED ORDER — HYDRALAZINE HCL 50 MG PO TABS
75.0000 mg | ORAL_TABLET | Freq: Three times a day (TID) | ORAL | Status: DC
  Administered 2014-07-26 – 2014-07-31 (×14): 75 mg via ORAL
  Filled 2014-07-26 (×18): qty 1

## 2014-07-26 MED ORDER — DEXTROSE 50 % IV SOLN
12.5000 g | Freq: Once | INTRAVENOUS | Status: AC
  Administered 2014-07-26: 12.5 g via INTRAVENOUS

## 2014-07-26 MED ORDER — DEXTROSE 50 % IV SOLN
25.0000 mL | Freq: Once | INTRAVENOUS | Status: AC | PRN
  Administered 2014-07-26: 25 mL via INTRAVENOUS

## 2014-07-26 MED ORDER — TORSEMIDE 20 MG PO TABS: 20.0000 mg | ORAL_TABLET | Freq: Two times a day (BID) | ORAL | Status: DC

## 2014-07-26 MED ORDER — SODIUM CHLORIDE 0.9 % IV SOLN
INTRAVENOUS | Status: DC | PRN
  Administered 2014-07-26: 14:00:00 via INTRAVENOUS

## 2014-07-26 MED ORDER — PROPOFOL 10 MG/ML IV BOLUS
INTRAVENOUS | Status: DC | PRN
  Administered 2014-07-26: 30 mg via INTRAVENOUS

## 2014-07-26 MED ORDER — SODIUM CHLORIDE 0.9 % IV SOLN
250.0000 mL | INTRAVENOUS | Status: DC
  Administered 2014-07-26: 250 mL via INTRAVENOUS

## 2014-07-26 MED ORDER — PROPOFOL INFUSION 10 MG/ML OPTIME
INTRAVENOUS | Status: DC | PRN
  Administered 2014-07-26: 75 ug/kg/min via INTRAVENOUS

## 2014-07-26 MED ORDER — SODIUM CHLORIDE 0.9 % IJ SOLN: 3.0000 mL | INTRAMUSCULAR | Status: DC | PRN

## 2014-07-26 MED ORDER — POTASSIUM CHLORIDE CRYS ER 20 MEQ PO TBCR
40.0000 meq | EXTENDED_RELEASE_TABLET | Freq: Once | ORAL | Status: AC
  Administered 2014-07-26: 40 meq via ORAL
  Filled 2014-07-26: qty 2

## 2014-07-26 MED ORDER — SODIUM CHLORIDE 0.9 % IV SOLN
INTRAVENOUS | Status: DC
  Administered 2014-07-26: 500 mL via INTRAVENOUS

## 2014-07-26 MED ORDER — METOLAZONE 2.5 MG PO TABS
2.5000 mg | ORAL_TABLET | Freq: Once | ORAL | Status: AC
Start: 2014-07-26 — End: 2014-07-26
  Administered 2014-07-26: 2.5 mg via ORAL
  Filled 2014-07-26: qty 1

## 2014-07-26 NOTE — CV Procedure (Signed)
Procedure: TEE  Indication: Atrial fibrillation, pre-DCCV.   Sedation: Propofol  Findings: Please see echo section for full report.  Mildly dilated LV with severely decreased systolic function EF 98%.  Mildly dilated RV with moderate to severely decreased systolic function.  Mild MR, mild TR. Moderate biatrial dilation with no LAA thrombus though smoke was present.   May proceed to DCCV.   [name] 07/26/2014 4:44 PM

## 2014-07-26 NOTE — Progress Notes (Signed)
Patient ID: Tony Freeman, male   DOB: April 06, 1951, 63 y.o.   MRN: [id number]    Subjective:    Continues to diurese briskly, weight down 8 lbs (total 10 lbs) and 24 hr I/O -3.7 liters. Denies SOB, orthopnea or SOB. BUN stable and renal function slightly elevated. Going for TEE-DC-CV. Remains on milrinone 0.25 mcg.   RHC (9/29): Tortuous venous system, unable to pass [place] despite wire use beyond RA.  Did not switch to leg as he is fully anticoagulated.  RA 20 mmHg CI 2.13 (using RA sat)   Objective:   Temp:  [97.8 F (36.6 C)-98.5 F (36.9 C)] 97.8 F (36.6 C) (09/30 0356) Pulse Rate:  [58-99] 97 (09/30 0356) Resp:  [18-20] 18 (09/30 0356) BP: (121-148)/(63-92) 123/75 mmHg (09/30 0356) SpO2:  [97 %-100 %] 99 % (09/30 0356) Weight:  [220 lb (99.791 kg)] 220 lb (99.791 kg) (09/30 0356) Last BM Date: 07/25/14  Filed Weights   07/24/14 0435 07/25/14 0429 07/26/14 0356  Weight: 235 lb 11.2 oz (106.913 kg) 228 lb 3.2 oz (103.511 kg) 220 lb (99.791 kg)    Intake/Output Summary (Last 24 hours) at 07/26/14 0859 Last data filed at 07/26/14 0451  Gross per 24 hour  Intake 1042.05 ml  Output   4745 ml  Net -3702.95 ml    Telemetry: Rate-controlled atrial fibrillation.  Exam: General: Well appearing. No resp difficulty, sitting up in chair HEENT: normal  Neck: supple. JVP difficult to assess d/t body habitus but appears elevated. Carotids 2+ bilat; no bruits. No lymphadenopathy or thryomegaly appreciated.  Cor: PMI nondisplaced. Irregular S1S2. No rubs, gallops or murmurs.  Lungs: clear  Abdomen: Obese, soft, nontender, nondistended. No hepatosplenomegaly. No bruits or masses. Good bowel sounds.  Extremities: no cyanosis, clubbing, rash, 2-3+ bilateral edema  Neuro: alert & orientedx3, cranial nerves grossly intact. moves all 4 extremities w/o difficulty. Affect pleasant  Lab Results:  Basic Metabolic Panel:  Recent Labs Lab 07/19/14 0915  07/23/14 0351 07/24/14 0440  07/25/14 0422 07/26/14 0520  NA 140  < > 134* 136* 138 139  K 4.0  < > 4.2 4.1 3.6* 3.1*  CL 99  < > 94* 96 94* 93*  CO2 29  < > 24 24 29  33*  GLUCOSE 188*  < > 207* 218* 103* 138*  BUN 52*  < > 81* 78* 81* 81*  CREATININE 2.77*  < > 3.15* 3.02* 3.09* 3.14*  CALCIUM 9.4  < > 9.3 9.5 9.9 9.7  MG 2.1  --  2.3  --   --   --   < > = values in this interval not displayed.  Liver Function Tests: No results found for this basename: AST, ALT, ALKPHOS, BILITOT, PROT, ALBUMIN,  in the last 168 hours  CBC:  Recent Labs Lab 07/24/14 0440 07/25/14 0422 07/26/14 0520  WBC 10.6* 10.7* 9.7  HGB 9.1* 10.2* 9.8*  HCT 27.9* 31.6* 30.4*  MCV 90.9 89.5 88.6  PLT 247 220 216    Cardiac Enzymes:  Recent Labs Lab 07/20/14 0945  TROPONINI <0.30    BNP:  Recent Labs  07/16/14 1401  PROBNP 12348.0*     Medications:   Scheduled Medications: . amiodarone  400 mg Oral BID  . atorvastatin  40 mg Oral Daily  . carvedilol  6.25 mg Oral BID WC  . cholecalciferol  2,000 Units Oral Daily  . edoxaban  30 mg Oral Daily  . ferrous sulfate  325 mg Oral BID WC  .  furosemide  80 mg Intravenous TID  . hydrALAZINE  50 mg Oral TID  . insulin aspart  0-15 Units Subcutaneous TID WC  . insulin aspart  0-5 Units Subcutaneous QHS  . insulin glargine  30 Units Subcutaneous QHS  . isosorbide mononitrate  60 mg Oral Daily  . mometasone-formoterol  2 puff Inhalation BID  . multivitamin with minerals  1 tablet Oral Daily  . potassium chloride  30 mEq Oral BID  . potassium chloride  40 mEq Oral Once  . sodium chloride  3 mL Intravenous Q12H  . sodium chloride  3 mL Intravenous Q12H    Infusions: . sodium chloride 15 mL/hr at 07/25/14 0636  . milrinone 0.25 mcg/kg/min (07/25/14 2009)    PRN Medications: sodium chloride, sodium chloride, acetaminophen, albuterol, HYDROcodone-acetaminophen, ondansetron (ZOFRAN) IV, ramelteon, sodium chloride   Assessment/Plan/Discussion:   1. Acute on chronic  systolic CHF: Primarily ischemic cardiomyopathy.  CHF likely worsened by atrial fibrillation. Started on IV lasix and milrinone. RHC showed RA pressure 20 on RHC with CI low but not markedly. - Volume status improving and weight down 8 lbs. Renal function slightly elevated and BUN above baseline. Will continue 80 mg IV lasix TID and give another dose of 2.5 mg of metolazone.  - Continue milrinone 0.25 mcg currently tomorrow will start trying to titrate off.  - Continue current coreg. Increase hydralazine to 75 mg TID and continue Imdur 60 mg daily. - Not on ACE-I/ARB or Spiro with CKD 2. Atrial fibrillation: Remains in atrial fibrillation on edoxaban.  HR controlled, he is on amiodarone and Coreg.  Getting TEE-DC-CV today. Very concerned that after procedure patient is not going to be able to afford edoxaban since his primary cardiologist in [place] gave him samples and he only has 3 left. He needs to definitely be covered for 30 days post op and would like indefinitely. Have placed CSW consult in to check on price if he can't afford may be beneficial to switch to xarelto since preferred with Medicare.  3. AKI on CKD: Creatinine slightly elevated, however BUN much elevated than past. As above will change to PO diuretics tomorrow.  4. CAD: Stable. Continue statin.   Tony Freeman B NP-C 07/26/2014 8:59 AM  Patient seen and examined with Tony Bame, NP. We discussed all aspects of the encounter. I agree with the assessment and plan as stated above.   He remains significantly volume overloaded. Renal function slightly worse. Would continue IV diuresis - however I think that dialysis may be the only way to get/keep fluid off. That said,  I am not sure he will tolerate HD long with low EF. Will proceed with TEE/DC-CV today to try and optimize hemodynamics and facilitate volume removal.   I discussed with his daughter Tony Freeman) Tony Freeman by phone and I will keep her updated.  [phone].   Tony Stotts,MD 11:03 AM

## 2014-07-26 NOTE — Procedures (Signed)
Electrical Cardioversion Procedure Note Jami Ohlin [id number] [birth year]  Procedure: Electrical Cardioversion Indications:  Atrial Fibrillation.  He has been on edoxaban and TEE showed no LAA thrombus.   Procedure Details Consent: Risks of procedure as well as the alternatives and risks of each were explained to the (patient/caregiver).  Consent for procedure obtained. Time Out: Verified patient identification, verified procedure, site/side was marked, verified correct patient position, special equipment/implants available, medications/allergies/relevent history reviewed, required imaging and test results available.  Performed  Patient placed on cardiac monitor, pulse oximetry, supplemental oxygen as necessary.  Sedation given: Propofol per anesthesiology.  Pacer pads placed anterior and posterior chest.  Cardioverted 1 time(s).  Cardioverted at [place].  Evaluation Findings: Post procedure EKG shows: NSR Complications: None Patient did tolerate procedure well.  [employer] ICD interrogated after procedure, no problems.    [name] 07/26/2014, 4:44 PM

## 2014-07-26 NOTE — Interval H&P Note (Signed)
History and Physical Interval Note:  07/26/2014 4:34 PM  Tony Freeman  has presented today for surgery, with the diagnosis of afib  The various methods of treatment have been discussed with the patient and family. After consideration of risks, benefits and other options for treatment, the patient has consented to  Procedure(s): TRANSESOPHAGEAL ECHOCARDIOGRAM (TEE) (N/A) CARDIOVERSION (N/A) as a surgical intervention .  The patient's history has been reviewed, patient examined, no change in status, stable for surgery.  I have reviewed the patient's chart and labs.  Questions were answered to the patient's satisfaction.     Laquanna Veazey [employer]

## 2014-07-26 NOTE — Anesthesia Postprocedure Evaluation (Signed)
  Anesthesia Post-op Note  Patient: Tony Freeman  Procedure(s) Performed: Procedure(s): TRANSESOPHAGEAL ECHOCARDIOGRAM (TEE) (N/A) CARDIOVERSION (N/A)  Patient Location: Endoscopy Unit  Anesthesia Type:MAC  Level of Consciousness: awake, alert  and oriented  Airway and Oxygen Therapy: Patient Spontanous Breathing and Patient connected to nasal cannula oxygen  Post-op Pain: none  Post-op Assessment: Post-op Vital signs reviewed, Patient's Cardiovascular Status Stable and Respiratory Function Stable  Post-op Vital Signs: Reviewed and stable  Last Vitals:  Filed Vitals:   07/26/14 1348  BP: 130/90  Pulse: 74  Temp: 36.6 C  Resp: 12    Complications: No apparent anesthesia complications

## 2014-07-26 NOTE — Transfer of Care (Signed)
Immediate Anesthesia Transfer of Care Note  Patient: Tony Freeman  Procedure(s) Performed: Procedure(s): TRANSESOPHAGEAL ECHOCARDIOGRAM (TEE) (N/A) CARDIOVERSION (N/A)  Patient Location: Endoscopy Unit  Anesthesia Type:MAC  Level of Consciousness: awake, alert  and oriented  Airway & Oxygen Therapy: Patient Spontanous Breathing and Patient connected to nasal cannula oxygen  Post-op Assessment: Report given to PACU RN  Post vital signs: Reviewed and stable  Complications: No apparent anesthesia complications

## 2014-07-26 NOTE — Progress Notes (Signed)
Subjective: [name] reports he is doing well today. He denies CP, SOB, nausea/vomiting. He denies difficulty urinating.  Objective: Vital signs in last 24 hours: Filed Vitals:   07/25/14 2051 07/25/14 2113 07/26/14 0207 07/26/14 0356  BP:  122/76 121/63 123/75  Pulse:  64 73 97  Temp:  98.5 F (36.9 C) 98.1 F (36.7 C) 97.8 F (36.6 C)  TempSrc:  Oral Oral Oral  Resp:  20 18 18   Height:      Weight:    220 lb (99.791 kg)  SpO2: 97% 98% 98% 99%   Weight change: -8 lb 3.2 oz (-3.719 kg)  Intake/Output Summary (Last 24 hours) at 07/26/14 0844 Last data filed at 07/26/14 0451  Gross per 24 hour  Intake 1042.05 ml  Output   4745 ml  Net -3702.95 ml   General Apperance: NAD  Head: Normocephalic, atraumatic  Eyes: PERRL, EOMI, anicteric sclera  Ears: Normal external ear canal  Nose: Nares normal, septum midline, mucosa normal  Throat: Lips, mucosa and tongue normal  Neck: Supple, trachea midline  Back: No tenderness or bony abnormality  Lungs: Clear to auscultation bilaterally. No wheezes No rhonchi or rales. Breathing comfortably  Chest Wall: Nontender, no deformity  Heart: Irregularly irregular, no murmur/rub/gallop  Abdomen: Soft, nontender, nondistended, no rebound/guarding  Extremities: Normal, atraumatic, warm and well perfused, 2+ pitting edema to BLE to just below knee (R greater than L). Pulses: 2+ throughout  Skin: No rashes or lesions  Neurologic: Alert and oriented x 3. CNII-XII intact. Normal strength and sensation   Lab Results: Basic Metabolic Panel:  Recent Labs Lab 07/19/14 0915  07/23/14 0351  07/25/14 0422 07/26/14 0520  NA 140  < > 134*  < > 138 139  K 4.0  < > 4.2  < > 3.6* 3.1*  CL 99  < > 94*  < > 94* 93*  CO2 29  < > 24  < > 29 33*  GLUCOSE 188*  < > 207*  < > 103* 138*  BUN 52*  < > 81*  < > 81* 81*  CREATININE 2.77*  < > 3.15*  < > 3.09* 3.14*  CALCIUM 9.4  < > 9.3  < > 9.9 9.7  MG 2.1  --  2.3  --   --   --   < > = values in  this interval not displayed.   CBC:  Recent Labs Lab 07/24/14 0440 07/25/14 0422 07/26/14 0520  WBC 10.6* 10.7* 9.7  NEUTROABS 9.2* 8.0*  --   HGB 9.1* 10.2* 9.8*  HCT 27.9* 31.6* 30.4*  MCV 90.9 89.5 88.6  PLT 247 220 216    Cardiac Enzymes:  Recent Labs Lab 07/20/14 0945  TROPONINI <0.30    CBG:  Recent Labs Lab 07/24/14 2107 07/25/14 0556 07/25/14 1109 07/25/14 1609 07/25/14 2110 07/26/14 0529  GLUCAP 187* 80 97 168* 284* 125*    Studies/Results: No results found.  Medications: I have reviewed the patient's current medications. Scheduled Meds: . amiodarone  400 mg Oral BID  . atorvastatin  40 mg Oral Daily  . carvedilol  6.25 mg Oral BID WC  . cholecalciferol  2,000 Units Oral Daily  . edoxaban  30 mg Oral Daily  . ferrous sulfate  325 mg Oral BID WC  . furosemide  80 mg Intravenous TID  . hydrALAZINE  50 mg Oral TID  . insulin aspart  0-15 Units Subcutaneous TID WC  . insulin aspart  0-5 Units Subcutaneous  QHS  . insulin glargine  30 Units Subcutaneous QHS  . isosorbide mononitrate  60 mg Oral Daily  . mometasone-formoterol  2 puff Inhalation BID  . multivitamin with minerals  1 tablet Oral Daily  . potassium chloride  30 mEq Oral BID  . sodium chloride  3 mL Intravenous Q12H  . sodium chloride  3 mL Intravenous Q12H   Continuous Infusions: . sodium chloride 15 mL/hr at 07/25/14 0636  . milrinone 0.25 mcg/kg/min (07/25/14 2009)   PRN Meds:.sodium chloride, sodium chloride, acetaminophen, albuterol, HYDROcodone-acetaminophen, ondansetron (ZOFRAN) IV, ramelteon, sodium chloride Assessment/Plan: Active Problems:   Chronic kidney disease (CKD), stage IV (severe)   DM (diabetes mellitus), type 2, uncontrolled, with renal complications   Essential hypertension, benign   CAD (coronary artery disease)   Other and unspecified hyperlipidemia   Abnormal renal ultrasound with left renal lesion and bladder wall thickening   PAF (paroxysmal atrial  fibrillation)   Lower extremity edema   Hypoglycemia   Acute renal failure superimposed on stage 4 chronic kidney disease   Acute on chronic combined systolic and diastolic heart failure   Syncope   Anemia  Acute exacerbation systolic and diastolic CHF s/p ICD 0/06/4708, s/p RHC 9/29: 220lb (admission 230lb) and -4L  -No ACE-I or ARBs given his renal disease -daily weights, strict i/o's  -continue statin  -continue carvedilol 25mg  BID  -continue IV Lasix 80mg  TID -2.5mg  metolazone x 1 -continue milrinone gtt  A fib: on edoxaban at home. Rate controlled. -continue amiodarone 400mg  BID -continue edoxaban  -DCCV per cardiology  AKI on CKD, stage IV, history of renal cystic lesion: Cr 2.14 from 3.09 today -Recommend renal ultrasound in 6-12 months since he cannot obtain an MRI due to his ICD. -rest of plan as above  Gout flare: Resolved -Norco 5/325mg  Q6hr prn pain  Syncope: No recurrence -continue to monitor  Anemia: 2/2 chronic disease with Fe deficiency. FOBT positive. -ferrous sulfate 325mg  BID -Outpatient GI follow up  DM2:  -Accuchecks QID -moderate SSI  -lantus 30u QHS  HTN: normotensive here. On coreg 25mg  BID, lasix 40mg  TID, hydralazine 50mg  TID, Imdur 60mg  daily at home.  -increase hydralazine to 75mg  TID  -continue Imdur 60mg  daily  -Lasix as above  -continue Coreg 25mg  BID   Asthma: on dulera 200/5 mcg and albuterol prn at home. -continue albuterol 2 puff Q6hr prn  -continue dulera 200/5 mcg 2 puff BID   FEN: H/H, carb modified  DVT ppx: SCDs, edoxaban  Dispo: Disposition is deferred at this time, awaiting improvement of current medical problems. Anticipated discharge in approximately 3-5 day(s).   The patient does have a current PCP Tony Nones, MD) and does not need an [facility] hospital follow-up appointment after discharge.  The patient does not have transportation limitations that hinder transportation to clinic appointments.  .Services Needed  at time of discharge: Y = Yes, Blank = No PT: Outpatient cardiac rehab, home health PT  OT:   RN:   Equipment: Rolling walker with 5" wheels  Other:     LOS: 10 days   Jacques Earthly, MD 07/26/2014, 8:44 AM

## 2014-07-26 NOTE — Progress Notes (Signed)
Inpatient Diabetes Program Recommendations  AACE/ADA: New Consensus Statement on Inpatient Glycemic Control (2013)  Target Ranges:  Prepandial:   less than 140 mg/dL      Peak postprandial:   less than 180 mg/dL (1-2 hours)      Critically ill patients:  140 - 180 mg/dL   Results for Tony Freeman, Tony Freeman (MRN [id number]) as of 07/26/2014 12:42  Ref. Range 07/25/2014 05:56 07/25/2014 11:09 07/25/2014 16:09 07/25/2014 21:10 07/26/2014 05:29 07/26/2014 11:28 07/26/2014 12:09  Glucose-Capillary Latest Range: 70-99 mg/dL 80 97 168 (H) 284 (H) 125 (H) 53 (L) 111 (H)   Diabetes history: DM2 Outpatient Diabetes medications: Lantus 45 units QHS, Novolog 10 units TID with meals Current orders for Inpatient glycemic control: Lantus 30 units QHS, Novolog 0-15 units AC, Novolog 0-5 units HS  Inpatient Diabetes Program Recommendations Correction (SSI): Noted CBG of 53 mg/dl at 11:28 today. Please consider decreasing Novolog correction to sensitive scale.  Thanks, Barnie Alderman, RN, MSN, CCRN Diabetes Coordinator Inpatient Diabetes Program [phone] (Team Pager) [phone] ([place] office) [phone] [facility] office)

## 2014-07-26 NOTE — Progress Notes (Addendum)
  Date: 07/26/2014  Patient name: Tony Freeman  Medical record number: [id number]  Date of birth: Apr 24, 1951   This patient has been seen and the plan of care was discussed with the house staff. Please see their note for complete details. I concur with their findings with the following additions/corrections:  Have reviewed Cardiology notes and recommendations.  He is due for TEE with cardioversion today. Continue diuresis.  Further plans based on results of cardioversion.   Dr. Beryle Beams will be the IM Attending starting tomorrow.   Sid Falcon, MD 07/26/2014, 2:22 PM

## 2014-07-26 NOTE — Progress Notes (Signed)
  Echocardiogram Echocardiogram Transesophageal has been performed.  Philipp Deputy 07/26/2014, 4:40 PM

## 2014-07-26 NOTE — Progress Notes (Signed)
Physical Therapy Treatment Patient Details Name: Tony Freeman MRN: [id number] DOB: 09/29/1951 Today's Date: 07/26/2014    History of Present Illness Pt admitted with CHF exacerbation and R ankle gout.    PT Comments    Pt progressing towards physical therapy goals. Pt was educated regarding use of RW for energy conservation and balance, however pt states he does not want to rely on the walker, preferring not to use it. TEE-DC-CV planned for later today. Will continue to follow and progress as able per POC.   Follow Up Recommendations  Home health PT;Supervision for mobility/OOB     Equipment Recommendations  Rolling walker with 5" wheels    Recommendations for Other Services       Precautions / Restrictions Precautions Precautions: Fall Restrictions Weight Bearing Restrictions: No    Mobility  Bed Mobility Overal bed mobility: Modified Independent             General bed mobility comments: Pt demonstrated proper hand placement and safety awareness.  Transfers Overall transfer level: Needs assistance Equipment used: None Transfers: Sit to/from Stand Sit to Stand: Min guard         General transfer comment: Pt without AD today, and required min guard for steadying assist while powering-up to full stand. Pt reports no dizziness with positional changes.   Ambulation/Gait Ambulation/Gait assistance: Min assist Ambulation Distance (Feet): 400 Feet Assistive device: None Gait Pattern/deviations: Step-through pattern;Decreased stride length;Trunk flexed Gait velocity: Decreased Gait velocity interpretation: Below normal speed for age/gender General Gait Details: Pt somewhat unsteady at times, requiring assist to maintain balance. Pt states he does not want to rely on the walker and feels his balance is "okay". No SOB noted.    Stairs            Wheelchair Mobility    Modified Rankin (Stroke Patients Only)       Balance Overall balance  assessment: Needs assistance Sitting-balance support: Feet supported;No upper extremity supported Sitting balance-Leahy Scale: Good     Standing balance support: No upper extremity supported Standing balance-Leahy Scale: Fair Standing balance comment: Occasional assist only for unsteadiness                    Cognition Arousal/Alertness: Awake/alert Behavior During Therapy: WFL for tasks assessed/performed Overall Cognitive Status: Within Functional Limits for tasks assessed                      Exercises General Exercises - Lower Extremity Ankle Circles/Pumps: 20 reps Quad Sets: 15 reps Long Arc Quad: 15 reps Hip ABduction/ADduction: 15 reps    General Comments        Pertinent Vitals/Pain Pain Assessment: No/denies pain    Home Living                      Prior Function            PT Goals (current goals can now be found in the care plan section) Acute Rehab PT Goals Patient Stated Goal: home with family PT Goal Formulation: With patient Time For Goal Achievement: 08/03/14 Potential to Achieve Goals: Good Progress towards PT goals: Progressing toward goals    Frequency  Min 3X/week    PT Plan Current plan remains appropriate    Co-evaluation             End of Session Equipment Utilized During Treatment: Gait belt Activity Tolerance: Patient tolerated treatment well Patient left: in chair;with call  bell/phone within reach;with nursing/sitter in room     Time: 0908-0938 PT Time Calculation (min): 30 min  Charges:  $Gait Training: 8-22 mins $Therapeutic Exercise: 8-22 mins                    G Codes:      Jolyn Lent 08-04-2014, 10:11 AM  Jolyn Lent, PT, DPT Acute Rehabilitation Services Pager: [phone]

## 2014-07-26 NOTE — H&P (View-Only) (Signed)
Patient ID: Tony Freeman, male   DOB: [birth year], 63 y.o.   MRN: [id number]    Subjective:    Continues to diurese briskly, weight down 8 lbs (total 10 lbs) and 24 hr I/O -3.7 liters. Denies SOB, orthopnea or SOB. BUN stable and renal function slightly elevated. Going for TEE-DC-CV. Remains on milrinone 0.25 mcg.   RHC (9/29): Tortuous venous system, unable to pass Swan despite wire use beyond RA.  Did not switch to leg as he is fully anticoagulated.  RA 20 mmHg CI 2.13 (using RA sat)   Objective:   Temp:  [97.8 F (36.6 C)-98.5 F (36.9 C)] 97.8 F (36.6 C) (09/30 0356) Pulse Rate:  [58-99] 97 (09/30 0356) Resp:  [18-20] 18 (09/30 0356) BP: (121-148)/(63-92) 123/75 mmHg (09/30 0356) SpO2:  [97 %-100 %] 99 % (09/30 0356) Weight:  [220 lb (99.791 kg)] 220 lb (99.791 kg) (09/30 0356) Last BM Date: 07/25/14  Filed Weights   07/24/14 0435 07/25/14 0429 07/26/14 0356  Weight: 235 lb 11.2 oz (106.913 kg) 228 lb 3.2 oz (103.511 kg) 220 lb (99.791 kg)    Intake/Output Summary (Last 24 hours) at 07/26/14 0859 Last data filed at 07/26/14 0451  Gross per 24 hour  Intake 1042.05 ml  Output   4745 ml  Net -3702.95 ml    Telemetry: Rate-controlled atrial fibrillation.  Exam: General: Well appearing. No resp difficulty, sitting up in chair HEENT: normal  Neck: supple. JVP difficult to assess d/t body habitus but appears elevated. Carotids 2+ bilat; no bruits. No lymphadenopathy or thryomegaly appreciated.  Cor: PMI nondisplaced. Irregular S1S2. No rubs, gallops or murmurs.  Lungs: clear  Abdomen: Obese, soft, nontender, nondistended. No hepatosplenomegaly. No bruits or masses. Good bowel sounds.  Extremities: no cyanosis, clubbing, rash, 2-3+ bilateral edema  Neuro: alert & orientedx3, cranial nerves grossly intact. moves all 4 extremities w/o difficulty. Affect pleasant  Lab Results:  Basic Metabolic Panel:  Recent Labs Lab 07/19/14 0915  07/23/14 0351 07/24/14 0440  07/25/14 0422 07/26/14 0520  NA 140  < > 134* 136* 138 139  K 4.0  < > 4.2 4.1 3.6* 3.1*  CL 99  < > 94* 96 94* 93*  CO2 29  < > 24 24 29  33*  GLUCOSE 188*  < > 207* 218* 103* 138*  BUN 52*  < > 81* 78* 81* 81*  CREATININE 2.77*  < > 3.15* 3.02* 3.09* 3.14*  CALCIUM 9.4  < > 9.3 9.5 9.9 9.7  MG 2.1  --  2.3  --   --   --   < > = values in this interval not displayed.  Liver Function Tests: No results found for this basename: AST, ALT, ALKPHOS, BILITOT, PROT, ALBUMIN,  in the last 168 hours  CBC:  Recent Labs Lab 07/24/14 0440 07/25/14 0422 07/26/14 0520  WBC 10.6* 10.7* 9.7  HGB 9.1* 10.2* 9.8*  HCT 27.9* 31.6* 30.4*  MCV 90.9 89.5 88.6  PLT 247 220 216    Cardiac Enzymes:  Recent Labs Lab 07/20/14 0945  TROPONINI <0.30    BNP:  Recent Labs  07/16/14 1401  PROBNP 12348.0*     Medications:   Scheduled Medications: . amiodarone  400 mg Oral BID  . atorvastatin  40 mg Oral Daily  . carvedilol  6.25 mg Oral BID WC  . cholecalciferol  2,000 Units Oral Daily  . edoxaban  30 mg Oral Daily  . ferrous sulfate  325 mg Oral BID WC  .  furosemide  80 mg Intravenous TID  . hydrALAZINE  50 mg Oral TID  . insulin aspart  0-15 Units Subcutaneous TID WC  . insulin aspart  0-5 Units Subcutaneous QHS  . insulin glargine  30 Units Subcutaneous QHS  . isosorbide mononitrate  60 mg Oral Daily  . mometasone-formoterol  2 puff Inhalation BID  . multivitamin with minerals  1 tablet Oral Daily  . potassium chloride  30 mEq Oral BID  . potassium chloride  40 mEq Oral Once  . sodium chloride  3 mL Intravenous Q12H  . sodium chloride  3 mL Intravenous Q12H    Infusions: . sodium chloride 15 mL/hr at 07/25/14 0636  . milrinone 0.25 mcg/kg/min (07/25/14 2009)    PRN Medications: sodium chloride, sodium chloride, acetaminophen, albuterol, HYDROcodone-acetaminophen, ondansetron (ZOFRAN) IV, ramelteon, sodium chloride   Assessment/Plan/Discussion:   1. Acute on chronic  systolic CHF: Primarily ischemic cardiomyopathy.  CHF likely worsened by atrial fibrillation. Started on IV lasix and milrinone. RHC showed RA pressure 20 on RHC with CI low but not markedly. - Volume status improving and weight down 8 lbs. Renal function slightly elevated and BUN above baseline. Will continue 80 mg IV lasix TID and give another dose of 2.5 mg of metolazone.  - Continue milrinone 0.25 mcg currently tomorrow will start trying to titrate off.  - Continue current coreg. Increase hydralazine to 75 mg TID and continue Imdur 60 mg daily. - Not on ACE-I/ARB or Spiro with CKD 2. Atrial fibrillation: Remains in atrial fibrillation on edoxaban.  HR controlled, he is on amiodarone and Coreg.  Getting TEE-DC-CV today. Very concerned that after procedure patient is not going to be able to afford edoxaban since his primary cardiologist in [place] gave him samples and he only has 3 left. He needs to definitely be covered for 30 days post op and would like indefinitely. Have placed CSW consult in to check on price if he can't afford may be beneficial to switch to xarelto since preferred with Medicare.  3. AKI on CKD: Creatinine slightly elevated, however BUN much elevated than past. As above will change to PO diuretics tomorrow.  4. CAD: Stable. Continue statin.   Junie Bame B NP-C 07/26/2014 8:59 AM  Patient seen and examined with Junie Bame, NP. We discussed all aspects of the encounter. I agree with the assessment and plan as stated above.   He remains significantly volume overloaded. Renal function slightly worse. Would continue IV diuresis - however I think that dialysis may be the only way to get/keep fluid off. That said,  I am not sure he will tolerate HD long with low EF. Will proceed with TEE/DC-CV today to try and optimize hemodynamics and facilitate volume removal.   I discussed with his daughter [name]) [name] by phone and I will keep her updated.  [phone].   Emmitt Matthews,MD 11:03 AM

## 2014-07-26 NOTE — Anesthesia Preprocedure Evaluation (Addendum)
Anesthesia Evaluation  Patient identified by MRN, date of birth, ID band Patient awake    Reviewed: Allergy & Precautions, H&P , NPO status , Patient's Chart, lab work & pertinent test results, reviewed documented beta blocker date and time   Airway Mallampati: II TM Distance: >3 FB Neck ROM: Full    Dental no notable dental hx. (+) Teeth Intact, Dental Advisory Given   Pulmonary neg pulmonary ROS,  breath sounds clear to auscultation  Pulmonary exam normal       Cardiovascular hypertension, Pt. on medications and Pt. on home beta blockers + CAD and +CHF Rhythm:Regular Rate:Normal     Neuro/Psych CVA negative psych ROS   GI/Hepatic negative GI ROS, Neg liver ROS,   Endo/Other  diabetes, Type 1, Insulin Dependent  Renal/GU Renal disease  negative genitourinary   Musculoskeletal   Abdominal   Peds  Hematology negative hematology ROS (+) anemia ,   Anesthesia Other Findings   Reproductive/Obstetrics negative OB ROS                          Anesthesia Physical Anesthesia Plan  ASA: IV  Anesthesia Plan: MAC   Post-op Pain Management:    Induction: Intravenous  Airway Management Planned: Nasal Cannula  Additional Equipment:   Intra-op Plan:   Post-operative Plan:   Informed Consent: I have reviewed the patients History and Physical, chart, labs and discussed the procedure including the risks, benefits and alternatives for the proposed anesthesia with the patient or authorized representative who has indicated his/her understanding and acceptance.   Dental advisory given  Plan Discussed with: CRNA  Anesthesia Plan Comments:         Anesthesia Quick Evaluation

## 2014-07-26 NOTE — Progress Notes (Signed)
Hypoglycemic Event  CBG: 53  Treatment: D50 IV 25 mL  Symptoms: asymptomatic  Follow-up CBG: Time:1209 CBG Result:111  Possible Reasons for Event: Inadequate meal intake  Comments/MD notified: Pt is NPO for procedure.    Glean Hess P  Remember to initiate Hypoglycemia Order Set & complete

## 2014-07-27 ENCOUNTER — Encounter (HOSPITAL_COMMUNITY): Payer: Self-pay | Admitting: Cardiology

## 2014-07-27 DIAGNOSIS — D631 Anemia in chronic kidney disease: Secondary | ICD-10-CM

## 2014-07-27 DIAGNOSIS — E118 Type 2 diabetes mellitus with unspecified complications: Secondary | ICD-10-CM

## 2014-07-27 DIAGNOSIS — S37009A Unspecified injury of unspecified kidney, initial encounter: Secondary | ICD-10-CM

## 2014-07-27 DIAGNOSIS — R55 Syncope and collapse: Secondary | ICD-10-CM

## 2014-07-27 DIAGNOSIS — N184 Chronic kidney disease, stage 4 (severe): Secondary | ICD-10-CM

## 2014-07-27 DIAGNOSIS — I5043 Acute on chronic combined systolic (congestive) and diastolic (congestive) heart failure: Principal | ICD-10-CM

## 2014-07-27 DIAGNOSIS — J45909 Unspecified asthma, uncomplicated: Secondary | ICD-10-CM

## 2014-07-27 DIAGNOSIS — I129 Hypertensive chronic kidney disease with stage 1 through stage 4 chronic kidney disease, or unspecified chronic kidney disease: Secondary | ICD-10-CM

## 2014-07-27 DIAGNOSIS — I4891 Unspecified atrial fibrillation: Secondary | ICD-10-CM

## 2014-07-27 DIAGNOSIS — N179 Acute kidney failure, unspecified: Secondary | ICD-10-CM

## 2014-07-27 LAB — BASIC METABOLIC PANEL
ANION GAP: 15 (ref 5–15)
BUN: 74 mg/dL — ABNORMAL HIGH (ref 6–23)
CHLORIDE: 92 meq/L — AB (ref 96–112)
CO2: 31 mEq/L (ref 19–32)
CREATININE: 3.21 mg/dL — AB (ref 0.50–1.35)
Calcium: 9.3 mg/dL (ref 8.4–10.5)
GFR calc Af Amer: 22 mL/min — ABNORMAL LOW (ref >90)
GFR calc non Af Amer: 19 mL/min — ABNORMAL LOW (ref >90)
Glucose, Bld: 128 mg/dL — ABNORMAL HIGH (ref 70–99)
POTASSIUM: 3.5 meq/L — AB (ref 3.7–5.3)
Sodium: 138 mEq/L (ref 137–147)

## 2014-07-27 LAB — GLUCOSE, CAPILLARY
GLUCOSE-CAPILLARY: 181 mg/dL — AB (ref 70–99)
Glucose-Capillary: 123 mg/dL — ABNORMAL HIGH (ref 70–99)
Glucose-Capillary: 239 mg/dL — ABNORMAL HIGH (ref 70–99)
Glucose-Capillary: 270 mg/dL — ABNORMAL HIGH (ref 70–99)

## 2014-07-27 MED ORDER — POTASSIUM CHLORIDE CRYS ER 20 MEQ PO TBCR
20.0000 meq | EXTENDED_RELEASE_TABLET | Freq: Once | ORAL | Status: AC
  Administered 2014-07-27: 20 meq via ORAL
  Filled 2014-07-27: qty 1

## 2014-07-27 MED ORDER — METOLAZONE 2.5 MG PO TABS
2.5000 mg | ORAL_TABLET | Freq: Once | ORAL | Status: AC
  Administered 2014-07-27: 2.5 mg via ORAL
  Filled 2014-07-27: qty 1

## 2014-07-27 NOTE — Progress Notes (Addendum)
Subjective: Doing well.  He denies CP, SOB, nausea/vomiting. Had cadioversion for afib yesterday, remains in NSR.  Objective: Vital signs in last 24 hours: Filed Vitals:   07/27/14 0611 07/27/14 0700 07/27/14 0900 07/27/14 0954  BP: 136/72  104/57 122/53  Pulse: 67  69 67  Temp: 98.2 F (36.8 C)     TempSrc: Oral     Resp: 20     Height:      Weight: 97.3 kg (214 lb 8.1 oz)     SpO2: 97% 97%     Weight change: -2.491 kg (-5 lb 7.9 oz)  Intake/Output Summary (Last 24 hours) at 07/27/14 1251 Last data filed at 07/27/14 1127  Gross per 24 hour  Intake    480 ml  Output   3475 ml  Net  -2995 ml   General Apperance: NAD  Head: Normocephalic, atraumatic  Eyes: PERRL, EOMI, anicteric sclera  Ears: Normal external ear canal  Nose: Nares normal, septum midline, mucosa normal  Throat: Lips, mucosa and tongue normal  Neck: Supple, trachea midline  Back: No tenderness or bony abnormality  Lungs: Clear to auscultation bilaterally. No wheezes No rhonchi or rales. Breathing comfortably  Chest Wall: Nontender, no deformity  Heart: Irregularly irregular, no murmur/rub/gallop  Abdomen: Soft, nontender, nondistended, no rebound/guarding  Extremities: Normal, atraumatic, warm and well perfused, 2+ pitting edema to BLE to just below knee. Pulses: 2+ throughout  Skin: No rashes or lesions  Neurologic: Alert and oriented x 3. CNII-XII intact. Normal strength and sensation  Lab Results: Basic Metabolic Panel:  Recent Labs Lab 07/23/14 0351  07/26/14 0520 07/27/14 0440  NA 134*  < > 139 138  K 4.2  < > 3.1* 3.5*  CL 94*  < > 93* 92*  CO2 24  < > 33* 31  GLUCOSE 207*  < > 138* 128*  BUN 81*  < > 81* 74*  CREATININE 3.15*  < > 3.14* 3.21*  CALCIUM 9.3  < > 9.7 9.3  MG 2.3  --   --   --   < > = values in this interval not displayed.   CBC:  Recent Labs Lab 07/24/14 0440 07/25/14 0422 07/26/14 0520  WBC 10.6* 10.7* 9.7  NEUTROABS 9.2* 8.0*  --   HGB 9.1* 10.2* 9.8*    HCT 27.9* 31.6* 30.4*  MCV 90.9 89.5 88.6  PLT 247 220 216    Cardiac Enzymes: No results found for this basename: CKTOTAL, CKMB, CKMBINDEX, TROPONINI,  in the last 168 hours  CBG:  Recent Labs Lab 07/26/14 1209 07/26/14 1610 07/26/14 1628 07/26/14 2104 07/27/14 0609 07/27/14 1126  GLUCAP 111* 62* 104* 226* 123* 181*    Studies/Results: No results found.  Medications: I have reviewed the patient's current medications. Scheduled Meds: . amiodarone  400 mg Oral BID  . atorvastatin  40 mg Oral Daily  . carvedilol  6.25 mg Oral BID WC  . cholecalciferol  2,000 Units Oral Daily  . edoxaban  30 mg Oral Daily  . ferrous sulfate  325 mg Oral BID WC  . furosemide  80 mg Intravenous TID  . hydrALAZINE  75 mg Oral TID  . insulin aspart  0-9 Units Subcutaneous TID WC  . insulin glargine  30 Units Subcutaneous QHS  . isosorbide mononitrate  60 mg Oral Daily  . mometasone-formoterol  2 puff Inhalation BID  . multivitamin with minerals  1 tablet Oral Daily  . sodium chloride  3 mL Intravenous Q12H  .  sodium chloride  3 mL Intravenous Q12H  . sodium chloride  3 mL Intravenous Q12H   Continuous Infusions: . sodium chloride 15 mL/hr at 07/25/14 0636  . sodium chloride 500 mL (07/26/14 1318)  . sodium chloride 250 mL (07/26/14 1725)  . milrinone 0.25 mcg/kg/min (07/26/14 2323)   PRN Meds:.sodium chloride, sodium chloride, acetaminophen, albuterol, HYDROcodone-acetaminophen, ondansetron (ZOFRAN) IV, ramelteon, sodium chloride, sodium chloride Assessment/Plan: Active Problems:   Chronic kidney disease (CKD), stage IV (severe)   DM (diabetes mellitus), type 2, uncontrolled, with renal complications   Essential hypertension, benign   CAD (coronary artery disease)   Other and unspecified hyperlipidemia   Abnormal renal ultrasound with left renal lesion and bladder wall thickening   PAF (paroxysmal atrial fibrillation)   Lower extremity edema   Hypoglycemia   Acute renal failure  superimposed on stage 4 chronic kidney disease   Acute on chronic combined systolic and diastolic heart failure   Syncope   Anemia  Acute exacerbation systolic and diastolic CHF - primarily from ischemic cardiomyopathy.   s/p ICD 06/29/2014, s/p RHC 9/29 RHC showed RA pressure 20 on RHC with CI low but not markedly.- ECHO 6/15 EF  25-30%. TEE 9/30 showed EF 25%,. No thrombus.  66 LB today (admission 230lb) diuresing well, -3L yesterday. Cardioverted from Afib as it could worsen CHF.  -daily weights, strict i/o's  -continue statin, continue carvedilol 6.25mg  BID, continue IV Lasix 80mg  TID - Not on ACE-I/ARB or Spiro with CKD -s/p 2.5mg  metolazone x 2.  continue milrinone gtt - wean off by HF team recs.  - continues to have BLE edema but may not be able to diurese more with Crt bumping. May need dialysis to get fluid off if anything. Will need to be followed by HF clinic closely.  A fib: on edoxaban at home. -DCCV yesterday. Afib likely worsened CHF. Remains in NSR now. -continue amiodarone 400mg  BID -continue edoxaban for now, switch to xarelto as outpatient if CSW cannot find a way to make it affordable.  AKI on CKD, stage IV, history of renal cystic lesion: baseline Crt around 2.7. Today 3.21.  -Recommend renal ultrasound in 6-12 months since he cannot obtain an MRI due to his ICD - could be cardiorenal or from diuresis.  HTN: normotensive here. On coreg 25mg  BID, lasix 40mg  BID PO, hydralazine 50mg  TID, Imdur 60mg  daily at home.  -increase hydralazine to 75mg  TID  -continue Imdur 60mg  daily  -Lasix as above  -continue Coreg 6.25mg  BID  Gout flare: Resolved -Norco 5/325mg  Q6hr prn pain  Syncope: No recurrence -continue to monitor  Anemia: 2/2 chronic disease with Fe deficiency. FOBT positive. H/h stable around 10.2 (baseline ~10).  -ferrous sulfate 325mg  BID -Outpatient GI follow up  DM2:  -Accuchecks QID -moderate SSI  -lantus 30u QHS  Asthma: on dulera 200/5 mcg and  albuterol prn at home. Stable. -continue albuterol 2 puff Q6hr prn  -continue dulera 200/5 mcg 2 puff BID   FEN: H/H, carb modified  DVT ppx: SCDs, edoxaban  Dispo: Disposition is deferred at this time, awaiting improvement of current medical problems. Anticipated discharge in approximately 3-5 day(s).   The patient does have a current PCP Tanya Nones, MD) and does not need an [facility] hospital follow-up appointment after discharge.  The patient does not have transportation limitations that hinder transportation to clinic appointments.  .Services Needed at time of discharge: Y = Yes, Blank = No PT: Outpatient cardiac rehab, home health PT  OT:   RN:  Equipment: Rolling walker with 5" wheels  Other:     LOS: 11 days   Dellia Nims, MD 07/27/2014, 12:51 PM

## 2014-07-27 NOTE — Discharge Summary (Signed)
Name: Tony Freeman MRN: [id number] DOB: 12-Jul-1951 64 y.o. PCP: Tanya Nones, MD  Date of Admission: 07/16/2014  1:23 PM Date of Discharge: 07/31/2014 Attending Physician: No att. providers found  Discharge Diagnosis: Principal Problem:   Acute on chronic combined systolic and diastolic heart failure Active Problems:   Chronic kidney disease (CKD), stage IV (severe)   DM (diabetes mellitus), type 2, uncontrolled, with renal complications   Essential hypertension, benign   CAD (coronary artery disease)   Other and unspecified hyperlipidemia   PAF (paroxysmal atrial fibrillation)   Lower extremity edema   Hypoglycemia   Acute renal failure superimposed on stage 4 chronic kidney disease   Syncope   Anemia  Discharge Medications:   Medication List    STOP taking these medications       furosemide 40 MG tablet  Commonly known as:  LASIX      TAKE these medications       albuterol 108 (90 BASE) MCG/ACT inhaler  Commonly known as:  PROVENTIL HFA;VENTOLIN HFA  Inhale 2 puffs into the lungs every 6 (six) hours as needed for wheezing or shortness of breath.     amiodarone 400 MG tablet  Commonly known as:  PACERONE  Take 1 tablet (400 mg total) by mouth daily.     aspirin 81 MG chewable tablet  Chew 81 mg by mouth daily.     atorvastatin 40 MG tablet  Commonly known as:  LIPITOR  Take 1 tablet (40 mg total) by mouth daily.     carvedilol 6.25 MG tablet  Commonly known as:  COREG  Take 1 tablet (6.25 mg total) by mouth 2 (two) times daily with a meal.     cholecalciferol 1000 UNITS tablet  Commonly known as:  VITAMIN D  Take 2,000 Units by mouth daily.     DULERA 200-5 MCG/ACT Aero  Generic drug:  mometasone-formoterol  Inhale 2 puffs into the lungs 2 (two) times daily.     edoxaban 30 MG Tabs tablet  Commonly known as:  SAVAYSA  Take 1 tablet (30 mg total) by mouth daily.     ferrous sulfate 325 (65 FE) MG tablet  Take 1 tablet (325 mg total)  by mouth 2 (two) times daily with a meal.     hydrALAZINE 25 MG tablet  Commonly known as:  APRESOLINE  Take 3 tablets (75 mg total) by mouth 3 (three) times daily.     insulin aspart 100 UNIT/ML injection  Commonly known as:  novoLOG  Inject 10 Units into the skin 3 (three) times daily with meals.     insulin glargine 100 UNIT/ML injection  Commonly known as:  LANTUS  Inject 0.25 mLs (25 Units total) into the skin at bedtime.     isosorbide mononitrate 60 MG 24 hr tablet  Commonly known as:  IMDUR  Take 60 mg by mouth daily.     multivitamin with minerals Tabs tablet  Take 1 tablet by mouth daily.     Potassium Chloride ER 20 MEQ Tbcr  Take 40 mEq by mouth daily.     predniSONE 10 MG tablet  Commonly known as:  DELTASONE  Take 1 tablet (10 mg total) by mouth daily with breakfast.     torsemide 20 MG tablet  Commonly known as:  DEMADEX  Take 2 tablets (40 mg total) by mouth daily.        Disposition and follow-up:   Mr.Tony Freeman was discharged from [facility] in Stable condition.  At the hospital follow up visit please address:  1.  Needs follow up with nephrology as her crt bumped in the hospital. Also needs u/s for renal cyst evaluation in 6-12 months.  2.  Labs / imaging needed at time of follow-up: BMP.  3.  Pending labs/ test needing follow-up:   Follow-up Appointments: Follow-up Information   Follow up with PERROTT, Renato Gails, MD. Schedule an appointment as soon as possible for a visit.   Specialty:  Family Medicine   Contact information:   [place] [address] [phone]       Follow up with [facility]. (Registered Nurse and Physical Therapy services to start within 24-48 hours post hospital discharge)    Contact information:   [address] [place] [address] [phone]       Follow up with Rande Brunt, NP On 08/04/2014. (at 08:45am-- in the Advanced  Heart Failure Clinic--Gate code-0500--please bring meds)    Specialty:  Nurse Practitioner   Contact information:   [address] [place] [address] [phone]       Follow up with [facility].. (Patient to pick up free 30 day supply of Savaysa (edoxoban) at [facility] at discharge)    Contact information:   [address], [place], [place] [address] [phone]      Follow up with [facility] Instructions. (Patient to continue follow up on Savays Patient Assistance Program with Social Worker, Kennyth Lose at Dr. Clayborne Dana office)       Follow up with Savaysa Refills. (Patient to notify  his [facility] of needed refill at least (1) week ahead of refill date in order for pharmacy to special order medication and have available for refill)       Follow up with [facility]. (Outpatient Rehab / Physical Therapy Services)    Contact information:   [address]  [place], [place] [address] Phone [phone]      Discharge Instructions: Discharge Instructions   Call MD for:  difficulty breathing, headache or visual disturbances    Complete by:  As directed      Call MD for:  persistant dizziness or light-headedness    Complete by:  As directed      Call MD for:  persistant nausea and vomiting    Complete by:  As directed      Call MD for:  severe uncontrolled pain    Complete by:  As directed      Call MD for:  temperature >100.4    Complete by:  As directed      Diet - low sodium heart healthy    Complete by:  As directed      Diet - low sodium heart healthy    Complete by:  As directed      Increase activity slowly    Complete by:  As directed      Increase activity slowly    Complete by:  As directed            Consultations: Treatment Team:  Rounding Lbcardiology, MD  Procedures Performed:  Dg Chest 2 View  07/16/2014   CLINICAL DATA:  Short of breath.  Bilateral feet edema.  EXAM: CHEST  2 VIEW   COMPARISON:  04/03/2014.  FINDINGS: Cardiac silhouette is mildly enlarged. There is a left coronary artery stent. Normal mediastinal and hilar contours. Left anterior  chest wall sequential pacemaker is well positioned, new from the prior exam.  Clear lungs.  No pleural effusion or pneumothorax.  Bony thorax is demineralized but intact.  IMPRESSION: No acute cardiopulmonary disease.   Electronically Signed   By: Lajean Manes M.D.   On: 07/16/2014 16:14   Dg Ankle Complete Right  07/18/2014   CLINICAL DATA:  Right ankle pain and swelling.  EXAM: RIGHT ANKLE - COMPLETE 3+ VIEW  COMPARISON:  None.  FINDINGS: No evidence of acute fracture or dislocation. Ankle mortise intact with well preserved joint space. No visible joint effusion. Ossification in the distal interosseous membrane. Atherosclerotic calcification of the anterior and posterior tibial arteries and the plantar artery and dorsalis pedis artery in the foot.  IMPRESSION: No acute osseous abnormality. Ossification in the distal interosseous membrane consistent with a remote injury.   Electronically Signed   By: Evangeline Dakin M.D.   On: 07/18/2014 13:50   US Renal  07/17/2014   CLINICAL DATA:  Follow-up renal mass, chronic kidney disease  EXAM: RENAL/URINARY TRACT ULTRASOUND COMPLETE  COMPARISON:  05/23/2014 and 04/04/2014  FINDINGS: Right Kidney:  Length: 8.8 cm. Echogenic renal parenchyma, suggesting medical renal disease. 3.5 x 2.9 x 3.2 cm right upper pole renal cyst, possibly mildly complicated by hemorrhage on the current ultrasound, although simple on the initial prior. Additional simple 2.8 x 2.6 x 2.3 cm interpolar cyst. No hydronephrosis.  Left Kidney:  Length: 10.1 cm. Echogenic renal parenchyma, suggesting recurrent disease. 2.2 x 1.8 x 1.7 cm mildly complex/septated left upper pole renal cyst. No hydronephrosis.  Bladder:  Within normal limits.  IMPRESSION: 2.2 cm mildly complex/septated left upper pole renal cyst. Consider MR (without  contrast given the patient's GFR) for further characterization.  Two right renal cysts measuring up to 3.5 cm, one of which may be complicated by hemorrhage on the current study, although both appeared simple on prior studies.  Echogenic renal parenchyma, suggesting medical renal disease.   Electronically Signed   By: Julian Hy M.D.   On: 07/17/2014 22:39    2D Echo:   Cardiac Cath:  Right heart cath RHC showed RA pressure 20 on RHC with CI low but not markedly  Admission HPI:   Mr. Glatfelter is a 62 year old man with history of atrial fibrillation on edoxaban, CAD s/p PCI LAD/RCA 2007, LAD/RCA 2009, LAD 6073, systolic and diastolic CHF (echo 04/26/625 with LV EF: 25-30%, diffuse hypokinesis), ischemic cardiomyopathy s/p dual chamber ICD placement 06/29/2014, DM2, CKD, HLD presenting with LE edema and SOB x 1 week. It had been worsening. He reports his weight increased from 218 to 230 (12 lbs). He was seen by his PCP 3 days ago who increased his Lasix from BID to TID. Since increasing his Lasix, he felt that his LE edema has continued to worsen and he has gained an additional 2 lbs. He reports dry cough, increased orthopnea (2 to 3 pillow), worsened exertional dyspnea, PND. Denies fevers, chills, chest pain, nausea, vomiting, headache, abdominal pain, diarrhea, dysuria, hematuria, paresthesias, myalgias, arthralgias.   Hospital Course by problem list: Principal Problem:   Acute on chronic combined systolic and diastolic heart failure Active Problems:   Chronic kidney disease (CKD), stage IV (severe)   DM (diabetes mellitus), type 2, uncontrolled, with renal complications   Essential hypertension, benign   CAD (coronary artery disease)   Other and unspecified hyperlipidemia   PAF (paroxysmal atrial fibrillation)   Lower extremity edema   Hypoglycemia   Acute renal failure superimposed  on stage 4 chronic kidney disease   Syncope   Anemia   63 yo male with hx of Afib, CHF, HTN, CKD IV  here with CHF exacerbation.    Acute exacerbation systolic and diastolic CHF - patient is not having any symptoms- primarily from ischemic cardiomyopathy or Afib.  - came in with 230 lb and discharged on 204 lb.  - was DCCV for afib and remained in NSR. RHC was done showing RA pressure 20's.  - was diuresed with IV lasix initially then switched back to demadex because Crt bumped.  - was on milirinone drip to see if we could improve his cardiac function. Crt didn't improve on it. Was weaned off it.  - HF team adjusted his medications. demadex was held because maybe his preload was low causing not enough cardiac output. Co-ox done and was low but it was not from central line so it is likely inaccurate.  - demadex was restarted. Was sent on demadex 40mg  qdaily. Will need RHC again to determine cardiac output in the future.  - HF clinic will closely follow up. - continued statin, changed carvedilol to 6.25mg  BID, increased Hydralazine 75mg  TID, imdur 60mg  daily.  -Kdur 21meq once a day  - Not on ACE-I/ARB or Spiro with CKD  A fib: on edoxaban at home.  DCCV. Afib likely worsened CHF. Remains in NSR now.  -continue amiodarone 400mg  BID  -continue edoxaban for now. affordability has been very hard.Provided 30 day free card for Edoxaban.  - follow with HF for further med supplies.   AKI on CKD, stage IV, history of renal cystic lesion: baseline Crt around 2.7. Crt going up in here. Was 3.86 on discharge- Likely cardiorenal or over diuresis. Couldn't improve with milrinone or adjustment of dieuretics.  - will need nephrology follow up outpatient - has one per PCP.  -renal cyst was seen. Recommend renal ultrasound in 6-12 months since he cannot obtain an MRI due to his ICD  HTN: normotensive here. On coreg 25mg  BID, lasix 40mg  BID PO, hydralazine 50mg  TID, Imdur 60mg  daily at home.  -continue on hydralazine to 75mg  TID  -continue Imdur 60mg  daily  -continue Coreg 6.25mg  BID  Anemia: 2/2 chronic  disease with Fe deficiency. FOBT positive. H/h stable around 10.2 (baseline ~10).  -ferrous sulfate 325mg  BID  -Outpatient GI follow up  DM2:  -lantus 25u QHS here and continue at discharge.  + meal time 3 units correction here. Will do 10 units meal time correction outpatient.   Asthma: on dulera 200/5 mcg and albuterol prn at home. Stable.  -continue albuterol 2 puff Q6hr prn  -continue dulera 200/5 mcg 2 puff BID    Discharge Vitals:   BP 109/88  Pulse 50  Temp(Src) 98.6 F (37 C) (Oral)  Resp 18  Ht 5\' 6"  (1.676 m)  Wt 204 lb 9.4 oz (92.8 kg)  BMI 33.04 kg/m2  SpO2 100%  Discharge Labs:  Results for orders placed during the hospital encounter of 07/16/14 (from the past 24 hour(s))  GLUCOSE, CAPILLARY     Status: Abnormal   Collection Time    07/30/14  9:14 PM      Result Value Ref Range   Glucose-Capillary 148 (*) 70 - 99 mg/dL   Comment 1 Notify RN    BASIC METABOLIC PANEL     Status: Abnormal   Collection Time    07/31/14  3:04 AM      Result Value Ref Range   Sodium 137  137 - 147 mEq/L   Potassium 3.5 (*) 3.7 - 5.3 mEq/L   Chloride 93 (*) 96 - 112 mEq/L   CO2 33 (*) 19 - 32 mEq/L   Glucose, Bld 133 (*) 70 - 99 mg/dL   BUN 67 (*) 6 - 23 mg/dL   Creatinine, Ser 3.86 (*) 0.50 - 1.35 mg/dL   Calcium 9.3  8.4 - 10.5 mg/dL   GFR calc non Af Amer 15 (*) >90 mL/min   GFR calc Af Amer 18 (*) >90 mL/min   Anion gap 11  5 - 15  GLUCOSE, CAPILLARY     Status: None   Collection Time    07/31/14  6:43 AM      Result Value Ref Range   Glucose-Capillary 82  70 - 99 mg/dL  GLUCOSE, CAPILLARY     Status: Abnormal   Collection Time    07/31/14 10:56 AM      Result Value Ref Range   Glucose-Capillary 137 (*) 70 - 99 mg/dL    Signed: Dellia Nims, MD 07/31/2014, 4:54 PM    Services Ordered on Discharge:  Home health. Equipment Ordered on Discharge:

## 2014-07-27 NOTE — Progress Notes (Signed)
Patient ID: Tony Freeman, male   DOB: 04-20-1951, 63 y.o.   MRN: [id number]    Subjective:    Underwent TEE and DC-CV on 9/30. Maintaining NSR.   Continues to diurese briskly, weight down 6 lbs overnight (total 22 lbs). Denies SOB, orthopnea or SOB. Renal function slightly worse.Remains on milrinone 0.25 mcg.   RHC (9/29): Tortuous venous system, unable to pass Swan despite wire use beyond RA.  Did not switch to leg as he is fully anticoagulated.  RA 20 mmHg CI 2.13 (using RA sat)   Objective:   Temp:  [97.9 F (36.6 C)-98.2 F (36.8 C)] 98.2 F (36.8 C) (10/01 1445) Pulse Rate:  [65-71] 67 (10/01 1500) Resp:  [16-20] 18 (10/01 1445) BP: (104-149)/(53-88) 130/69 mmHg (10/01 1500) SpO2:  [94 %-100 %] 96 % (10/01 1445) Weight:  [97.3 kg (214 lb 8.1 oz)] 97.3 kg (214 lb 8.1 oz) (10/01 0611) Last BM Date: 07/26/14  Filed Weights   07/25/14 0429 07/26/14 0356 07/27/14 2595  Weight: 103.511 kg (228 lb 3.2 oz) 99.791 kg (220 lb) 97.3 kg (214 lb 8.1 oz)    Intake/Output Summary (Last 24 hours) at 07/27/14 1650 Last data filed at 07/27/14 1618  Gross per 24 hour  Intake    720 ml  Output   4175 ml  Net  -3455 ml    Telemetry: NSR 60s  Exam: General: Well appearing. No resp difficulty, sitting up in chair HEENT: normal  Neck: supple. JVP 10 Carotids 2+ bilat; no bruits. No lymphadenopathy or thryomegaly appreciated.  Cor: PMI nondisplaced. Irregular S1S2. No rubs, gallops or murmurs.  Lungs: clear  Abdomen: Obese, soft, nontender, nondistended. No hepatosplenomegaly. No bruits or masses. Good bowel sounds.  Extremities: no cyanosis, clubbing, rash, 1-2+ bilateral edema  Neuro: alert & orientedx3, cranial nerves grossly intact. moves all 4 extremities w/o difficulty. Affect pleasant  Lab Results:  Basic Metabolic Panel:  Recent Labs Lab 07/23/14 0351  07/25/14 0422 07/26/14 0520 07/27/14 0440  NA 134*  < > 138 139 138  K 4.2  < > 3.6* 3.1* 3.5*  CL 94*  < >  94* 93* 92*  CO2 24  < > 29 33* 31  GLUCOSE 207*  < > 103* 138* 128*  BUN 81*  < > 81* 81* 74*  CREATININE 3.15*  < > 3.09* 3.14* 3.21*  CALCIUM 9.3  < > 9.9 9.7 9.3  MG 2.3  --   --   --   --   < > = values in this interval not displayed.  Liver Function Tests: No results found for this basename: AST, ALT, ALKPHOS, BILITOT, PROT, ALBUMIN,  in the last 168 hours  CBC:  Recent Labs Lab 07/24/14 0440 07/25/14 0422 07/26/14 0520  WBC 10.6* 10.7* 9.7  HGB 9.1* 10.2* 9.8*  HCT 27.9* 31.6* 30.4*  MCV 90.9 89.5 88.6  PLT 247 220 216    Cardiac Enzymes: No results found for this basename: CKTOTAL, CKMB, CKMBINDEX, TROPONINI,  in the last 168 hours  BNP:  Recent Labs  07/16/14 1401  PROBNP 12348.0*     Medications:   Scheduled Medications: . amiodarone  400 mg Oral BID  . atorvastatin  40 mg Oral Daily  . carvedilol  6.25 mg Oral BID WC  . cholecalciferol  2,000 Units Oral Daily  . edoxaban  30 mg Oral Daily  . ferrous sulfate  325 mg Oral BID WC  . furosemide  80 mg Intravenous TID  . hydrALAZINE  75 mg Oral TID  . insulin aspart  0-9 Units Subcutaneous TID WC  . insulin glargine  30 Units Subcutaneous QHS  . isosorbide mononitrate  60 mg Oral Daily  . metolazone  2.5 mg Oral Once  . mometasone-formoterol  2 puff Inhalation BID  . multivitamin with minerals  1 tablet Oral Daily  . potassium chloride  20 mEq Oral Once  . sodium chloride  3 mL Intravenous Q12H  . sodium chloride  3 mL Intravenous Q12H  . sodium chloride  3 mL Intravenous Q12H    Infusions: . sodium chloride 15 mL/hr at 07/25/14 0636  . sodium chloride 500 mL (07/26/14 1318)  . sodium chloride 250 mL (07/26/14 1725)  . milrinone 0.25 mcg/kg/min (07/27/14 1354)    PRN Medications: sodium chloride, sodium chloride, acetaminophen, albuterol, HYDROcodone-acetaminophen, ondansetron (ZOFRAN) IV, ramelteon, sodium chloride, sodium chloride   Assessment/Plan/Discussion:   1. Acute on chronic  systolic CHF: Primarily ischemic cardiomyopathy.  CHF likely worsened by atrial fibrillation. Started on IV lasix and milrinone. RHC showed RA pressure 20 on RHC with CI low but not markedly. - Volume status continues to improve. Renal function slightly worse. Will continue 80 mg IV lasix TID and give another dose of 2.5 mg of metolazone. Possibly switch back to po diuretics tomorrow. - Continue milrinone 0.25 mcg currently tomorrow will start trying to titrate off.  - Continue current coreg and hydralazine to 75 mg TID and continue Imdur 60 mg daily. - Not on ACE-I/ARB or Spiro with CKD 2. Atrial fibrillation: S/p DC-CV on 9/30. Now in NSR. Continue edoxaban. Concerned that after procedure patient is not going to be able to afford edoxaban since his primary cardiologist in [place] gave him samples and he only has 3 left. He needs to definitely be covered for 30 days post op and would like indefinitely. Have placed CSW consult in to check on price if he can't afford may be beneficial to switch to xarelto since preferred with Medicare.  3. AKI on CKD: Creatinine slightly elevated d/t cardiorenal syndrome. Continue to follow/ 4. CAD: Stable. Continue statin.  5. Hypokalemia - will supplement.  I discussed with his daughter [name]) [name] by phone and I will keep her updated.  [phone].   Glori Bickers MD  07/27/2014 4:50 PM

## 2014-07-27 NOTE — Progress Notes (Signed)
Patient ID: Tony Freeman, male   DOB: [birth year], 63 y.o.   MRN: [id number] Medical attending progress note: I personally interviewed and examined this patient together with resident physician Dr. Dellia Nims and I concur with his  evaluation, and management plan. 63 year old man with ischemic cardiomyopathy, atrial fibrillation/flutter precipitating acute congestive heart failure. He underwent DC cardioversion yesterday and is now back in normal sinus rhythm. He is being diuresed aggressively. Additional inotropic agent in the form of milrinone added to his regimen which will now be tapered off per cardiology recommendations. He has improved significantly clinically and is in no distress. Lungs are clear. Regular cardiac rhythm at this time. Decreased but persistent peripheral edema. We agree with Dr. Haroldine Laws that it would be preferable to change into a more cost effective oral anticoagulant at time of discharge.  Tony Hopper, MD, [facility]  Hematology-Oncology/Internal Medicine

## 2014-07-27 NOTE — Progress Notes (Signed)
UR completed Thimothy Barretta K. Aristidis Talerico, RN, BSN, MSHL, CCM  07/27/2014 1:48 PM

## 2014-07-28 LAB — BASIC METABOLIC PANEL
Anion gap: 13 (ref 5–15)
BUN: 70 mg/dL — ABNORMAL HIGH (ref 6–23)
CALCIUM: 9.3 mg/dL (ref 8.4–10.5)
CO2: 34 mEq/L — ABNORMAL HIGH (ref 19–32)
Chloride: 88 mEq/L — ABNORMAL LOW (ref 96–112)
Creatinine, Ser: 3.41 mg/dL — ABNORMAL HIGH (ref 0.50–1.35)
GFR calc Af Amer: 21 mL/min — ABNORMAL LOW (ref >90)
GFR, EST NON AFRICAN AMERICAN: 18 mL/min — AB (ref >90)
GLUCOSE: 231 mg/dL — AB (ref 70–99)
Potassium: 3.4 mEq/L — ABNORMAL LOW (ref 3.7–5.3)
SODIUM: 135 meq/L — AB (ref 137–147)

## 2014-07-28 LAB — GLUCOSE, CAPILLARY
GLUCOSE-CAPILLARY: 160 mg/dL — AB (ref 70–99)
GLUCOSE-CAPILLARY: 268 mg/dL — AB (ref 70–99)
GLUCOSE-CAPILLARY: 222 mg/dL — AB (ref 70–99)
Glucose-Capillary: 233 mg/dL — ABNORMAL HIGH (ref 70–99)

## 2014-07-28 MED ORDER — POTASSIUM CHLORIDE CRYS ER 20 MEQ PO TBCR
40.0000 meq | EXTENDED_RELEASE_TABLET | Freq: Once | ORAL | Status: AC
  Administered 2014-07-28: 40 meq via ORAL
  Filled 2014-07-28: qty 2

## 2014-07-28 MED ORDER — ZOLPIDEM TARTRATE 5 MG PO TABS
5.0000 mg | ORAL_TABLET | Freq: Every evening | ORAL | Status: DC | PRN
  Administered 2014-07-28 – 2014-07-29 (×2): 5 mg via ORAL
  Filled 2014-07-28 (×3): qty 1

## 2014-07-28 MED ORDER — TORSEMIDE 20 MG PO TABS
40.0000 mg | ORAL_TABLET | Freq: Two times a day (BID) | ORAL | Status: DC
  Administered 2014-07-29 – 2014-07-30 (×3): 40 mg via ORAL
  Filled 2014-07-28 (×5): qty 2

## 2014-07-28 NOTE — Progress Notes (Signed)
I stopped in to review HF education and recommendations with Tony Freeman.  He acknowledges understanding and I emphasized the importance of following recommendations.  I have been in contact with Case Manager regarding his Edoxaban and possible switch to Xarelto if needed.  He has a follow-up scheduled in the AHF clinic on Friday October 9th at [facility].

## 2014-07-28 NOTE — Progress Notes (Signed)
Patient ID: Tony Freeman, male   DOB: 08-25-1951, 63 y.o.   MRN: [id number]    Subjective:    Underwent TEE and DC-CV on 9/30. Maintaining NSR.   Continues to diurese briskly, weight down another 4 lbs overnight (total 26 lbs). Denies SOB, orthopnea or SOB. Renal function slightly worse. Remains on milrinone 0.25 mcg.   RHC (9/29): RA 20 mmHg CI 2.13 (using RA sat)   Objective:   Temp:  [97.8 F (36.6 C)-98.4 F (36.9 C)] 98.4 F (36.9 C) (10/02 0447) Pulse Rate:  [63-70] 69 (10/02 0853) Resp:  [18-20] 20 (10/02 0447) BP: (130-140)/(64-77) 131/64 mmHg (10/02 0853) SpO2:  [94 %-98 %] 94 % (10/02 0809) Weight:  [95.3 kg (210 lb 1.6 oz)] 95.3 kg (210 lb 1.6 oz) (10/02 0447) Last BM Date: 07/26/14  Filed Weights   07/26/14 0356 07/27/14 0611 07/28/14 0447  Weight: 99.791 kg (220 lb) 97.3 kg (214 lb 8.1 oz) 95.3 kg (210 lb 1.6 oz)    Intake/Output Summary (Last 24 hours) at 07/28/14 1151 Last data filed at 07/28/14 0900  Gross per 24 hour  Intake 1271.49 ml  Output   5075 ml  Net -3803.51 ml    Telemetry: NSR 60s  Exam: General: Well appearing. No resp difficulty, sitting up in chair HEENT: normal  Neck: supple. JVP 6 Carotids 2+ bilat; no bruits. No lymphadenopathy or thryomegaly appreciated.  Cor: PMI nondisplaced. regular S1S2. No rubs, gallops or murmurs.  Lungs: clear  Abdomen: Obese, soft, nontender, nondistended. No hepatosplenomegaly. No bruits or masses. Good bowel sounds.  Extremities: no cyanosis, clubbing, rash,  No edema  Neuro: alert & orientedx3, cranial nerves grossly intact. moves all 4 extremities w/o difficulty. Affect pleasant  Lab Results:  Basic Metabolic Panel:  Recent Labs Lab 07/23/14 0351  07/26/14 0520 07/27/14 0440 07/28/14 0339  NA 134*  < > 139 138 135*  K 4.2  < > 3.1* 3.5* 3.4*  CL 94*  < > 93* 92* 88*  CO2 24  < > 33* 31 34*  GLUCOSE 207*  < > 138* 128* 231*  BUN 81*  < > 81* 74* 70*  CREATININE 3.15*  < > 3.14*  3.21* 3.41*  CALCIUM 9.3  < > 9.7 9.3 9.3  MG 2.3  --   --   --   --   < > = values in this interval not displayed.  Liver Function Tests: No results found for this basename: AST, ALT, ALKPHOS, BILITOT, PROT, ALBUMIN,  in the last 168 hours  CBC:  Recent Labs Lab 07/24/14 0440 07/25/14 0422 07/26/14 0520  WBC 10.6* 10.7* 9.7  HGB 9.1* 10.2* 9.8*  HCT 27.9* 31.6* 30.4*  MCV 90.9 89.5 88.6  PLT 247 220 216    Cardiac Enzymes: No results found for this basename: CKTOTAL, CKMB, CKMBINDEX, TROPONINI,  in the last 168 hours  BNP:  Recent Labs  07/16/14 1401  PROBNP 12348.0*     Medications:   Scheduled Medications: . amiodarone  400 mg Oral BID  . atorvastatin  40 mg Oral Daily  . carvedilol  6.25 mg Oral BID WC  . cholecalciferol  2,000 Units Oral Daily  . edoxaban  30 mg Oral Daily  . ferrous sulfate  325 mg Oral BID WC  . furosemide  80 mg Intravenous TID  . hydrALAZINE  75 mg Oral TID  . insulin aspart  0-9 Units Subcutaneous TID WC  . insulin glargine  30 Units Subcutaneous QHS  .  isosorbide mononitrate  60 mg Oral Daily  . mometasone-formoterol  2 puff Inhalation BID  . multivitamin with minerals  1 tablet Oral Daily  . sodium chloride  3 mL Intravenous Q12H  . sodium chloride  3 mL Intravenous Q12H  . sodium chloride  3 mL Intravenous Q12H    Infusions: . sodium chloride 10 mL/hr at 07/27/14 1729  . sodium chloride 500 mL (07/26/14 1318)  . sodium chloride 250 mL (07/26/14 1725)  . milrinone 0.25 mcg/kg/min (07/28/14 0208)    PRN Medications: sodium chloride, sodium chloride, acetaminophen, albuterol, HYDROcodone-acetaminophen, ondansetron (ZOFRAN) IV, ramelteon, sodium chloride, sodium chloride   Assessment/Plan/Discussion:   1. Acute on chronic systolic CHF: Primarily ischemic cardiomyopathy.  CHF likely worsened by atrial fibrillation. Started on IV lasix and milrinone. RHC showed RA pressure 20 on RHC with CI low but not markedly. - Volume  status continues to improve. Now euvolemic. Agree with switching to po diuretics. Would use demadex 40 bid.  - Will start milrinone wean tomorrow if renal function stable. Hopefully off by Monday and he can go home.  - Continue current coreg and hydralazine to 75 mg TID and continue Imdur 60 mg daily. - Not on ACE-I/ARB or Spiro with CKD 2. Atrial fibrillation: S/p DC-CV on 9/30. Now in NSR. Continue edoxaban. Concerned that after procedure patient is not going to be able to afford edoxaban since his primary cardiologist in [place] gave him samples and he only has 3 left. He needs to definitely be covered for 30 days post op and would like indefinitely. Have placed CSW consult in to check on price if he can't afford may be beneficial to switch to xarelto since preferred with Medicare.  3. AKI on CKD: Creatinine  elevated d/t cardiorenal syndrome. Continue to follow. I think this is where he needs to be dry. 4. CAD: Stable. Continue statin.  5. Hypokalemia - will supplement.  I discussed with his daughter [name]) [name] by phone and I will keep her updated.  [phone].   Glori Bickers MD  07/28/2014 11:51 AM

## 2014-07-28 NOTE — Progress Notes (Signed)
Inpatient Diabetes Program Recommendations  AACE/ADA: New Consensus Statement on Inpatient Glycemic Control (2013)  Target Ranges:  Prepandial:   less than 140 mg/dL      Peak postprandial:   less than 180 mg/dL (1-2 hours)      Critically ill patients:  140 - 180 mg/dL     Results for Tony Freeman, Tony Freeman (MRN [id number]) as of 07/28/2014 09:41  Ref. Range 07/27/2014 06:09 07/27/2014 11:26 07/27/2014 16:37 07/27/2014 21:33  Glucose-Capillary Latest Range: 70-99 mg/dL 123 (H) 181 (H) 239 (H) 270 (H)     **Note patient had some Hypoglycemic events on 09/30.  Novolog SSI reduced to Sensitive scale as a result (was previously ordered as Moderate scale).  **Patient is eating 100% of meals.  Had some issues with elevated postprandial glucose levels yesterday.    MD- If patient continues to have elevated postprandial glucose levels, please consider adding low dose Novolog Meal Coverage to current hospital regimen- Novolog 3 units tid with meals     Will follow Wyn Quaker RN, MSN, CDE Diabetes Coordinator Inpatient Diabetes Program Team Pager: [phone] (8a-10p)

## 2014-07-28 NOTE — Progress Notes (Signed)
Subjective: Doing well.  He denies CP, SOB, nausea/vomiting.  remains in NSR. Leg swelling improving. Breathing without any problem. Remains in NSR.  Objective: Vital signs in last 24 hours: Filed Vitals:   07/27/14 1500 07/27/14 1953 07/27/14 2015 07/28/14 0447  BP: 130/69  140/66 139/77  Pulse: 67  63 70  Temp:   97.8 F (36.6 C) 98.4 F (36.9 C)  TempSrc:   Oral Oral  Resp:   20 20  Height:      Weight:    95.3 kg (210 lb 1.6 oz)  SpO2:  98% 98% 97%   Weight change: -2 kg (-4 lb 6.6 oz)  Intake/Output Summary (Last 24 hours) at 07/28/14 0865 Last data filed at 07/28/14 7846  Gross per 24 hour  Intake 1271.49 ml  Output   5525 ml  Net -4253.51 ml   General Apperance: NAD  Head: Normocephalic, atraumatic  Eyes: PERRL, EOMI, anicteric sclera  Ears: Normal external ear canal  Nose: Nares normal, septum midline, mucosa normal  Throat: Lips, mucosa and tongue normal  Neck: Supple, trachea midline  Back: No tenderness or bony abnormality  Lungs: Clear to auscultation bilaterally. No wheezes No rhonchi or rales. Breathing comfortably  Chest Wall: Nontender, no deformity  Heart: Irregularly irregular, no murmur/rub/gallop  Abdomen: Soft, nontender, nondistended, no rebound/guarding  Extremities: Normal, atraumatic, warm and well perfused, trace edema to BLE on the ankles. Pulses: 2+ throughout  Skin: No rashes or lesions  Neurologic: Alert and oriented x 3. CNII-XII intact. Normal strength and sensation  Lab Results: Basic Metabolic Panel:  Recent Labs Lab 07/23/14 0351  07/27/14 0440 07/28/14 0339  NA 134*  < > 138 135*  K 4.2  < > 3.5* 3.4*  CL 94*  < > 92* 88*  CO2 24  < > 31 34*  GLUCOSE 207*  < > 128* 231*  BUN 81*  < > 74* 70*  CREATININE 3.15*  < > 3.21* 3.41*  CALCIUM 9.3  < > 9.3 9.3  MG 2.3  --   --   --   < > = values in this interval not displayed.   CBC:  Recent Labs Lab 07/24/14 0440 07/25/14 0422 07/26/14 0520  WBC 10.6* 10.7* 9.7    NEUTROABS 9.2* 8.0*  --   HGB 9.1* 10.2* 9.8*  HCT 27.9* 31.6* 30.4*  MCV 90.9 89.5 88.6  PLT 247 220 216    Cardiac Enzymes: No results found for this basename: CKTOTAL, CKMB, CKMBINDEX, TROPONINI,  in the last 168 hours  CBG:  Recent Labs Lab 07/26/14 2104 07/27/14 0609 07/27/14 1126 07/27/14 1637 07/27/14 2133 07/28/14 0618  GLUCAP 226* 123* 181* 239* 270* 160*    Studies/Results: No results found.  Medications: I have reviewed the patient's current medications. Scheduled Meds: . amiodarone  400 mg Oral BID  . atorvastatin  40 mg Oral Daily  . carvedilol  6.25 mg Oral BID WC  . cholecalciferol  2,000 Units Oral Daily  . edoxaban  30 mg Oral Daily  . ferrous sulfate  325 mg Oral BID WC  . furosemide  80 mg Intravenous TID  . hydrALAZINE  75 mg Oral TID  . insulin aspart  0-9 Units Subcutaneous TID WC  . insulin glargine  30 Units Subcutaneous QHS  . isosorbide mononitrate  60 mg Oral Daily  . mometasone-formoterol  2 puff Inhalation BID  . multivitamin with minerals  1 tablet Oral Daily  . sodium chloride  3 mL Intravenous  Q12H  . sodium chloride  3 mL Intravenous Q12H  . sodium chloride  3 mL Intravenous Q12H   Continuous Infusions: . sodium chloride 10 mL/hr at 07/27/14 1729  . sodium chloride 500 mL (07/26/14 1318)  . sodium chloride 250 mL (07/26/14 1725)  . milrinone 0.25 mcg/kg/min (07/28/14 0208)   PRN Meds:.sodium chloride, sodium chloride, acetaminophen, albuterol, HYDROcodone-acetaminophen, ondansetron (ZOFRAN) IV, ramelteon, sodium chloride, sodium chloride Assessment/Plan: Active Problems:   Chronic kidney disease (CKD), stage IV (severe)   DM (diabetes mellitus), type 2, uncontrolled, with renal complications   Essential hypertension, benign   CAD (coronary artery disease)   Other and unspecified hyperlipidemia   Abnormal renal ultrasound with left renal lesion and bladder wall thickening   PAF (paroxysmal atrial fibrillation)   Lower  extremity edema   Hypoglycemia   Acute renal failure superimposed on stage 4 chronic kidney disease   Acute on chronic combined systolic and diastolic heart failure   Syncope   Anemia  Acute exacerbation systolic and diastolic CHF - improving. primarily from ischemic cardiomyopathy.   s/p ICD 06/29/2014, s/p RHC 9/29 RHC showed RA pressure 20 on RHC with CI low but not markedly.- ECHO 6/15 EF  25-30%. TEE 9/30 showed EF 25%,. No thrombus.  210 LB today (admission 230lb) diuresing well, -4.2L yesterday. DCCV for afib. In NSR. -continue statin, continue carvedilol 6.25mg  BID,  - was on IV Lasix 80mg  TID, given Crt bumping and patient appearing more euvolemic today, switched to demadex 40mg  BID. - per HF team, will start milrinone wean tomorrow if renal function stable. Hopefully off by Monday and he can go home.  -s/p 2.5mg  metolazone x 2. -daily weights, strict i/o's  - Not on ACE-I/ARB or Arlyce Harman with CKD  May need dialysis to get fluid off if anything. Will need to be followed by HF clinic closely.  A fib: on edoxaban at home. -DCCV yesterday. Afib likely worsened CHF. Remains in NSR now. -continue amiodarone 400mg  BID -continue edoxaban for now, switch to xarelto as outpatient if CSW cannot find a way to make it affordable.  AKI on CKD, stage IV, history of renal cystic lesion: baseline Crt around 2.7. Crt going up.  -Recommend renal ultrasound in 6-12 months since he cannot obtain an MRI due to his ICD - could be cardiorenal or from diuresis.  HTN: normotensive here. On coreg 25mg  BID, lasix 40mg  BID PO, hydralazine 50mg  TID, Imdur 60mg  daily at home.  -increase hydralazine to 75mg  TID  -continue Imdur 60mg  daily  -Lasix as above  -continue Coreg 6.25mg  BID  Gout flare: Resolved -Norco 5/325mg  Q6hr prn pain  Syncope: No recurrence -continue to monitor  Anemia: 2/2 chronic disease with Fe deficiency. FOBT positive. H/h stable around 10.2 (baseline ~10).  -ferrous sulfate 325mg   BID -Outpatient GI follow up  DM2:  -Accuchecks QID -moderate SSI  -lantus 30u QHS  Asthma: on dulera 200/5 mcg and albuterol prn at home. Stable. -continue albuterol 2 puff Q6hr prn  -continue dulera 200/5 mcg 2 puff BID   FEN: H/H, carb modified  DVT ppx: SCDs, edoxaban  Dispo: Disposition is deferred at this time, awaiting improvement of current medical problems. Anticipated discharge in approximately 3-5 day(s).   The patient does have a current PCP Tanya Nones, MD) and does need an [facility] hospital follow-up appointment after discharge.  The patient does not have transportation limitations that hinder transportation to clinic appointments.  .Services Needed at time of discharge: Y = Yes, Blank = No  PT: Outpatient cardiac rehab, home health PT  OT:   RN:   Equipment: Rolling walker with 5" wheels  Other:     LOS: 12 days   Dellia Nims, MD 07/28/2014, 7:27 AM

## 2014-07-28 NOTE — Progress Notes (Signed)
Medicine attending: I personally interviewed and examined this patient today together with medical resident Dr. Dellia Nims and I concur with his evaluation and management plan. The patient continues to improve. He remains in sinus rhythm following DC cardioversion. Lungs are clear. No S3 gallop. He is being tapered off inotropes. He will be transitioned back from parenteral to oral diuretics at this time. Final recommendations by cardiology who will see the patient later today. I did discuss management briefly with Dr. Haroldine Laws by phone.  Murriel Hopper, MD, [place]  Hematology-Oncology/Internal Medicine

## 2014-07-28 NOTE — Progress Notes (Signed)
Patient evaluated for community based chronic disease management services with [facility] as a benefit of patient's [employer]. Spoke with patient at bedside to explain [facility] services. Services have been accepted with written consent.  His daughter Onalee Hua [phone]) and son Jayren Cease [phone]) are his authorized contacts.  Patient admitted on 9.20.15 with fluid volume overload secondary to CHF.  He has a scale.  He is going to be moving in with his son at [place], [place] [address].  His PCP is at [facility].  His pharmacy is [facility] on [address].  He is having trouble paying for his [facility].  He will be a new CHF Clinic patient at discharge.  Patient will receive a post discharge transition of care call and will be evaluated for monthly home visits for assessments and disease process education.  Left contact information and [facility] literature at bedside. Made Inpatient Case Manager aware that [facility] following. Of note, [facility] Care Management services does not replace or interfere with any services that are arranged by inpatient case management or social work.  For additional questions or referrals please contact Corliss Blacker BSN RN [facility] Liaison at [phone].

## 2014-07-28 NOTE — Progress Notes (Addendum)
Physical Therapy Treatment Patient Details Name: Tony Freeman MRN: [id number] DOB: Oct 10, 1951 Today's Date: 07/28/2014    History of Present Illness Pt admitted with CHF exacerbation and R ankle gout.    PT Comments    Pt progressing well towards physical therapy goals. Stair training goal deferred as pt's family has since moved him from a third floor apartment to a first floor apartment with no steps to enter. Feel that pt is doing well with mobility, however continues to exhibit occasional balance deficits, and decreased tolerance for functional activity. Pt educated on benefits of continued therapy or cardiac rehab at d/c, and pt agreeable. Will continue to follow until d/c and progress as able per POC.    Follow Up Recommendations  Outpatient PT;Supervision - Intermittent;Other (comment) (or Cardiac rehab)     Equipment Recommendations  None recommended by PT (Pt declining RW)    Recommendations for Other Services       Precautions / Restrictions Precautions Precautions: Fall Restrictions Weight Bearing Restrictions: No    Mobility  Bed Mobility               General bed mobility comments: Pt sitting EOB when PT arrived.  Transfers Overall transfer level: Modified independent Equipment used: None Transfers: Sit to/from Stand           General transfer comment: No unsteadiness noted. Pt demonstrated proper hand placement and safety awareness.   Ambulation/Gait Ambulation/Gait assistance: Min guard Ambulation Distance (Feet): 500 Feet Assistive device: None Gait Pattern/deviations: Step-through pattern;Decreased stride length Gait velocity: Decreased Gait velocity interpretation: Below normal speed for age/gender General Gait Details: Pt occasionally unsteady throughout gait training, often with turns or when looking around the hall more than normal. Reports no SOB.    Stairs            Wheelchair Mobility    Modified Rankin (Stroke  Patients Only)       Balance Overall balance assessment: Needs assistance Sitting-balance support: Feet supported;No upper extremity supported Sitting balance-Leahy Scale: Good     Standing balance support: No upper extremity supported;During functional activity Standing balance-Leahy Scale: Fair Standing balance comment: Pt can maintain standing balance without assistance, however for gait training would like to have someone present due to occasional unsteadiness.                     Cognition Arousal/Alertness: Awake/alert Behavior During Therapy: WFL for tasks assessed/performed Overall Cognitive Status: Within Functional Limits for tasks assessed                      Exercises General Exercises - Lower Extremity Ankle Circles/Pumps: 20 reps Quad Sets: 15 reps Gluteal Sets: 15 reps Long Arc Quad: 15 reps Hip ABduction/ADduction: 15 reps    General Comments        Pertinent Vitals/Pain Pain Assessment: No/denies pain    Home Living                      Prior Function            PT Goals (current goals can now be found in the care plan section) Acute Rehab PT Goals Patient Stated Goal: Home today PT Goal Formulation: With patient Time For Goal Achievement: 08/03/14 Potential to Achieve Goals: Good Progress towards PT goals: Progressing toward goals    Frequency  Min 3X/week    PT Plan Current plan remains appropriate    Co-evaluation  End of Session Equipment Utilized During Treatment: Gait belt Activity Tolerance: Patient tolerated treatment well Patient left: in chair;with call bell/phone within reach;with nursing/sitter in room     Time: 4327-6147 PT Time Calculation (min): 25 min  Charges:  $Gait Training: 8-22 mins $Therapeutic Exercise: 8-22 mins                    G Codes:      Rolinda Roan 08/27/14, 11:00 AM  Rolinda Roan, PT, DPT Acute Rehabilitation Services Pager: [phone]

## 2014-07-29 DIAGNOSIS — E1165 Type 2 diabetes mellitus with hyperglycemia: Secondary | ICD-10-CM

## 2014-07-29 DIAGNOSIS — E1129 Type 2 diabetes mellitus with other diabetic kidney complication: Secondary | ICD-10-CM

## 2014-07-29 DIAGNOSIS — I481 Persistent atrial fibrillation: Secondary | ICD-10-CM

## 2014-07-29 LAB — MAGNESIUM: MAGNESIUM: 2.1 mg/dL (ref 1.5–2.5)

## 2014-07-29 LAB — BASIC METABOLIC PANEL
Anion gap: 14 (ref 5–15)
BUN: 66 mg/dL — ABNORMAL HIGH (ref 6–23)
CHLORIDE: 91 meq/L — AB (ref 96–112)
CO2: 33 meq/L — AB (ref 19–32)
Calcium: 9.3 mg/dL (ref 8.4–10.5)
Creatinine, Ser: 3.47 mg/dL — ABNORMAL HIGH (ref 0.50–1.35)
GFR calc Af Amer: 20 mL/min — ABNORMAL LOW (ref >90)
GFR calc non Af Amer: 18 mL/min — ABNORMAL LOW (ref >90)
GLUCOSE: 107 mg/dL — AB (ref 70–99)
Potassium: 3.2 mEq/L — ABNORMAL LOW (ref 3.7–5.3)
SODIUM: 138 meq/L (ref 137–147)

## 2014-07-29 LAB — GLUCOSE, CAPILLARY
GLUCOSE-CAPILLARY: 198 mg/dL — AB (ref 70–99)
Glucose-Capillary: 89 mg/dL (ref 70–99)
Glucose-Capillary: 212 mg/dL — ABNORMAL HIGH (ref 70–99)
Glucose-Capillary: 152 mg/dL — ABNORMAL HIGH (ref 70–99)

## 2014-07-29 MED ORDER — POTASSIUM CHLORIDE CRYS ER 20 MEQ PO TBCR
40.0000 meq | EXTENDED_RELEASE_TABLET | ORAL | Status: AC
  Administered 2014-07-29 (×2): 40 meq via ORAL
  Filled 2014-07-29 (×2): qty 2

## 2014-07-29 MED ORDER — INSULIN GLARGINE 100 UNIT/ML ~~LOC~~ SOLN
25.0000 [IU] | Freq: Every day | SUBCUTANEOUS | Status: DC
  Administered 2014-07-29: 25 [IU] via SUBCUTANEOUS
  Filled 2014-07-29: qty 0.25

## 2014-07-29 MED ORDER — INSULIN ASPART 100 UNIT/ML ~~LOC~~ SOLN
3.0000 [IU] | Freq: Three times a day (TID) | SUBCUTANEOUS | Status: DC
  Administered 2014-07-29 – 2014-07-31 (×4): 3 [IU] via SUBCUTANEOUS

## 2014-07-29 NOTE — Progress Notes (Addendum)
. Patient ID: Tony Freeman, male   DOB: 18-Apr-1951, 63 y.o.   MRN: [id number]    Subjective:    Underwent TEE and DC-CV on 9/30. Maintaining NSR.   Continues to diurese, out another 1.8L negative overnight (weight down to 206). Denies SOB, orthopnea or SOB. Renal function slightly worse but may be stabilizing. Remains on milrinone 0.25 mcg.  Potassium is again low today at 3.2.   RHC (9/29): RA 20 mmHg CI 2.13 (using RA sat) CO-OX: 52%   Objective:   Temp:  [97.5 F (36.4 C)-98.1 F (36.7 C)] 97.8 F (36.6 C) (10/03 2876) Pulse Rate:  [63-68] 68 (10/03 0856) Resp:  [18-20] 18 (10/03 0633) BP: (119-148)/(64-75) 134/67 mmHg (10/03 0856) SpO2:  [98 %-100 %] 99 % (10/03 8115) Weight:  [206 lb 12.8 oz (93.804 kg)] 206 lb 12.8 oz (93.804 kg) (10/03 0633) Last BM Date: 07/28/14  Filed Weights   07/27/14 0611 07/28/14 0447 07/29/14 7262  Weight: 214 lb 8.1 oz (97.3 kg) 210 lb 1.6 oz (95.3 kg) 206 lb 12.8 oz (93.804 kg)    Intake/Output Summary (Last 24 hours) at 07/29/14 0928 Last data filed at 07/29/14 0533  Gross per 24 hour  Intake 817.91 ml  Output   2250 ml  Net -1432.09 ml    Telemetry: NSR 60s  Exam: General: Well appearing. No resp difficulty, sitting up in chair HEENT: normal  Neck: supple. JVP 6 Carotids 2+ bilat; no bruits. No lymphadenopathy or thryomegaly appreciated.  Cor: PMI nondisplaced. regular S1S2. No rubs, gallops or murmurs.  Lungs: clear  Abdomen: Obese, soft, nontender, nondistended. No hepatosplenomegaly. No bruits or masses. Good bowel sounds.  Extremities: no cyanosis, clubbing, rash,  No edema  Neuro: alert & orientedx3, cranial nerves grossly intact. moves all 4 extremities w/o difficulty. Affect pleasant  Lab Results:  Basic Metabolic Panel:  Recent Labs Lab 07/23/14 0351  07/27/14 0440 07/28/14 0339 07/29/14 0348  NA 134*  < > 138 135* 138  K 4.2  < > 3.5* 3.4* 3.2*  CL 94*  < > 92* 88* 91*  CO2 24  < > 31 34* 33*    GLUCOSE 207*  < > 128* 231* 107*  BUN 81*  < > 74* 70* 66*  CREATININE 3.15*  < > 3.21* 3.41* 3.47*  CALCIUM 9.3  < > 9.3 9.3 9.3  MG 2.3  --   --   --   --   < > = values in this interval not displayed.  Liver Function Tests: No results found for this basename: AST, ALT, ALKPHOS, BILITOT, PROT, ALBUMIN,  in the last 168 hours  CBC:  Recent Labs Lab 07/24/14 0440 07/25/14 0422 07/26/14 0520  WBC 10.6* 10.7* 9.7  HGB 9.1* 10.2* 9.8*  HCT 27.9* 31.6* 30.4*  MCV 90.9 89.5 88.6  PLT 247 220 216    Cardiac Enzymes: No results found for this basename: CKTOTAL, CKMB, CKMBINDEX, TROPONINI,  in the last 168 hours  BNP:  Recent Labs  07/16/14 1401  PROBNP 12348.0*     Medications:   Scheduled Medications: . amiodarone  400 mg Oral BID  . atorvastatin  40 mg Oral Daily  . carvedilol  6.25 mg Oral BID WC  . cholecalciferol  2,000 Units Oral Daily  . edoxaban  30 mg Oral Daily  . ferrous sulfate  325 mg Oral BID WC  . hydrALAZINE  75 mg Oral TID  . insulin aspart  0-9 Units Subcutaneous TID WC  .  insulin glargine  30 Units Subcutaneous QHS  . isosorbide mononitrate  60 mg Oral Daily  . mometasone-formoterol  2 puff Inhalation BID  . multivitamin with minerals  1 tablet Oral Daily  . sodium chloride  3 mL Intravenous Q12H  . sodium chloride  3 mL Intravenous Q12H  . sodium chloride  3 mL Intravenous Q12H  . torsemide  40 mg Oral BID    Infusions: . sodium chloride 10 mL/hr at 07/28/14 1829  . sodium chloride 500 mL (07/26/14 1318)  . sodium chloride 250 mL (07/26/14 1725)  . milrinone 0.25 mcg/kg/min (07/29/14 0531)    PRN Medications: sodium chloride, sodium chloride, acetaminophen, albuterol, HYDROcodone-acetaminophen, ondansetron (ZOFRAN) IV, sodium chloride, sodium chloride, zolpidem   Assessment/Plan/Discussion:   1. Acute on chronic systolic CHF: Primarily ischemic cardiomyopathy.  CHF likely worsened by atrial fibrillation. Started on IV lasix and  milrinone. RHC showed RA pressure 20 on RHC with CI low but not markedly. - Volume status continues to improve. Now euvolemic. Agree with switching to po diuretics. Would use demadex 40 bid.  - Wean milrinone to 0.125 today. Hopefully off by Monday and he can go home.  - Continue current coreg and hydralazine to 75 mg TID and continue Imdur 60 mg daily. - Not on ACE-I/ARB or Spiro with CKD 2. Atrial fibrillation: S/p DC-CV on 9/30. Now in NSR. Continue edoxaban. Concerned that after procedure patient is not going to be able to afford edoxaban since his primary cardiologist in [place] gave him samples and he only has 3 left. He needs to definitely be covered for 30 days post op and would like indefinitely. Have placed CSW consult in to check on price if he can't afford may be beneficial to switch to xarelto since preferred with Medicare.  3. AKI on CKD: Creatinine  elevated d/t cardiorenal syndrome. Continue to follow. I think this is where he needs to be dry. 4. CAD: Stable. Continue statin.  5. Hypokalemia - will supplement with 80 MEQ. Check magnesium level.  Pixie Casino, MD, [facility] Attending Cardiologist [facility]  Pixie Casino MD  07/29/2014 9:28 AM

## 2014-07-29 NOTE — Progress Notes (Signed)
The patient's Milrinone drip ran without complications overnight.  He received PRN Ambien overnight, but did not receive any other PRN medications.  He did not have any complaints of pain.

## 2014-07-29 NOTE — Progress Notes (Addendum)
The patient wanted a different sleep medication tonight because his current one caused him to stay awake last night.  Imts was notified.  New orders were given for Ambien.  The RN carried out the orders and the patient slept for most of the night.

## 2014-07-29 NOTE — Progress Notes (Signed)
Subjective: Doing well.  No complaints.  He denies CP, SOB, nausea/vomiting.  remains in NSR. Leg swelling improving. Breathing without any problem.  Objective: Vital signs in last 24 hours: Filed Vitals:   07/29/14 0633 07/29/14 0856 07/29/14 0942 07/29/14 1412  BP: 119/72 134/67 135/67 122/66  Pulse: 68 68 68 60  Temp: 97.8 F (36.6 C)   98.4 F (36.9 C)  TempSrc: Oral   Oral  Resp: 18     Height:      Weight: 206 lb 12.8 oz (93.804 kg)     SpO2: 99%   97%   Weight change: -3 lb 4.8 oz (-1.496 kg)  Intake/Output Summary (Last 24 hours) at 07/29/14 1509 Last data filed at 07/29/14 1038  Gross per 24 hour  Intake 817.91 ml  Output   1550 ml  Net -732.09 ml   Vitals reviewed. General: resting in bed, NAD HEENT: PERRL, EOMI, no scleral icterus Cardiac: RRR, no rubs, murmurs or gallops Pulm: clear to auscultation bilaterally, no wheezes, rales, or rhonchi Abd: soft, nontender, nondistended, BS present Ext: warm and well perfused, trace pedal edema Neuro: alert and oriented X3, cranial nerves II-XII grossly intact, strength and sensation to light touch equal in bilateral upper and lower extremities  Lab Results: Basic Metabolic Panel:  Recent Labs Lab 07/23/14 0351  07/28/14 0339 07/29/14 0348  NA 134*  < > 135* 138  K 4.2  < > 3.4* 3.2*  CL 94*  < > 88* 91*  CO2 24  < > 34* 33*  GLUCOSE 207*  < > 231* 107*  BUN 81*  < > 70* 66*  CREATININE 3.15*  < > 3.41* 3.47*  CALCIUM 9.3  < > 9.3 9.3  MG 2.3  --   --  2.1  < > = values in this interval not displayed.   CBC:  Recent Labs Lab 07/24/14 0440 07/25/14 0422 07/26/14 0520  WBC 10.6* 10.7* 9.7  NEUTROABS 9.2* 8.0*  --   HGB 9.1* 10.2* 9.8*  HCT 27.9* 31.6* 30.4*  MCV 90.9 89.5 88.6  PLT 247 220 216    Cardiac Enzymes: No results found for this basename: CKTOTAL, CKMB, CKMBINDEX, TROPONINI,  in the last 168 hours  CBG:  Recent Labs Lab 07/28/14 0618 07/28/14 1115 07/28/14 1709  07/28/14 2104 07/29/14 0645 07/29/14 1028  GLUCAP 160* 233* 268* 222* 89 198*    Studies/Results: No results found.  Medications: I have reviewed the patient's current medications. Scheduled Meds: . amiodarone  400 mg Oral BID  . atorvastatin  40 mg Oral Daily  . carvedilol  6.25 mg Oral BID WC  . cholecalciferol  2,000 Units Oral Daily  . edoxaban  30 mg Oral Daily  . ferrous sulfate  325 mg Oral BID WC  . hydrALAZINE  75 mg Oral TID  . insulin aspart  0-9 Units Subcutaneous TID WC  . insulin glargine  30 Units Subcutaneous QHS  . isosorbide mononitrate  60 mg Oral Daily  . mometasone-formoterol  2 puff Inhalation BID  . multivitamin with minerals  1 tablet Oral Daily  . sodium chloride  3 mL Intravenous Q12H  . sodium chloride  3 mL Intravenous Q12H  . sodium chloride  3 mL Intravenous Q12H  . torsemide  40 mg Oral BID   Continuous Infusions: . sodium chloride 10 mL/hr at 07/28/14 1829  . sodium chloride 500 mL (07/26/14 1318)  . sodium chloride 250 mL (07/26/14 1725)  . milrinone 0.125 mcg/kg/min (  07/29/14 0947)   PRN Meds:.sodium chloride, sodium chloride, acetaminophen, albuterol, HYDROcodone-acetaminophen, ondansetron (ZOFRAN) IV, sodium chloride, sodium chloride, zolpidem Assessment/Plan: Active Problems:   Chronic kidney disease (CKD), stage IV (severe)   DM (diabetes mellitus), type 2, uncontrolled, with renal complications   Essential hypertension, benign   CAD (coronary artery disease)   Other and unspecified hyperlipidemia   Abnormal renal ultrasound with left renal lesion and bladder wall thickening   PAF (paroxysmal atrial fibrillation)   Lower extremity edema   Hypoglycemia   Acute renal failure superimposed on stage 4 chronic kidney disease   Acute on chronic combined systolic and diastolic heart failure   Syncope   Anemia  63 yo male with hx of Afib, CHF, HTN, CKD IV here with CHF exacerbation.  Acute exacerbation systolic and diastolic CHF -  improving. primarily from ischemic cardiomyopathy.   s/p ICD 06/29/2014, s/p RHC 9/29 RHC showed RA pressure 20 on RHC with CI low but not markedly.- ECHO 6/15 EF  25-30%. TEE 9/30 showed EF 25%,. No thrombus.  206 LB today (admission 230lb) diuresing well, -4970.  DCCV for afib. Remains in NSR. -continue statin, continue carvedilol 6.25mg  BID,  - continue demadex 40mg  BID. Was was IV lasix before.  - HF team following - appreciate recs. Wean off milrinone - hopefully off by Monday and he can go home. Will have HF clinic f/up. -cont daily weights, strict i/o's  - Not on ACE-I/ARB or Spiro with CKD  A fib: on edoxaban at home. -DCCV yesterday. Afib likely worsened CHF. Remains in NSR now. -continue amiodarone 400mg  BID -continue edoxaban for now, switch to xarelto as outpatient if CSW cannot find a way to make it affordable.  AKI on CKD, stage IV, history of renal cystic lesion: baseline Crt around 2.7. Crt going up, but overall stabilized around 3.4. Likely cardiorenal or over diuresis. -Recommend renal ultrasound in 6-12 months since he cannot obtain an MRI due to his ICD  HTN: normotensive here. On coreg 25mg  BID, lasix 40mg  BID PO, hydralazine 50mg  TID, Imdur 60mg  daily at home.  -continue on hydralazine to 75mg  TID  -continue Imdur 60mg  daily  -continue Coreg 6.25mg  BID  Anemia: 2/2 chronic disease with Fe deficiency. FOBT positive. H/h stable around 10.2 (baseline ~10).  -ferrous sulfate 325mg  BID -Outpatient GI follow up  DM2:  -lantus 25u QHS + meal time 3 units correction + sensitive SSI   Asthma: on dulera 200/5 mcg and albuterol prn at home. Stable. -continue albuterol 2 puff Q6hr prn  -continue dulera 200/5 mcg 2 puff BID   FEN: H/H, carb modified  DVT ppx: SCDs, edoxaban  Dispo: Disposition is deferred at this time, awaiting improvement of current medical problems. Anticipated discharge in approximately 3-5 day(s).   The patient does have a current PCP Tanya Nones, MD) and does need an [facility] hospital follow-up appointment after discharge.  The patient does not have transportation limitations that hinder transportation to clinic appointments.  .Services Needed at time of discharge: Y = Yes, Blank = No PT: Outpatient cardiac rehab, home health PT  OT:   RN:   Equipment: Rolling walker with 5" wheels  Other:     LOS: 13 days   Dellia Nims, MD 07/29/2014, 3:09 PM

## 2014-07-29 NOTE — Progress Notes (Signed)
Report given to receiving RN. Patient sitting in recliner watching TV. No verbal complaints and no signs or symptoms of distress or discomfort.

## 2014-07-30 LAB — BASIC METABOLIC PANEL
Anion gap: 13 (ref 5–15)
BUN: 66 mg/dL — AB (ref 6–23)
CALCIUM: 9.4 mg/dL (ref 8.4–10.5)
CO2: 32 mEq/L (ref 19–32)
Chloride: 91 mEq/L — ABNORMAL LOW (ref 96–112)
Creatinine, Ser: 3.8 mg/dL — ABNORMAL HIGH (ref 0.50–1.35)
GFR calc Af Amer: 18 mL/min — ABNORMAL LOW (ref >90)
GFR, EST NON AFRICAN AMERICAN: 16 mL/min — AB (ref >90)
GLUCOSE: 62 mg/dL — AB (ref 70–99)
Potassium: 3.5 mEq/L — ABNORMAL LOW (ref 3.7–5.3)
Sodium: 136 mEq/L — ABNORMAL LOW (ref 137–147)

## 2014-07-30 LAB — GLUCOSE, CAPILLARY
GLUCOSE-CAPILLARY: 124 mg/dL — AB (ref 70–99)
GLUCOSE-CAPILLARY: 291 mg/dL — AB (ref 70–99)
GLUCOSE-CAPILLARY: 148 mg/dL — AB (ref 70–99)
Glucose-Capillary: 57 mg/dL — ABNORMAL LOW (ref 70–99)
Glucose-Capillary: 192 mg/dL — ABNORMAL HIGH (ref 70–99)

## 2014-07-30 LAB — CARBOXYHEMOGLOBIN
CARBOXYHEMOGLOBIN: 2.3 % — AB (ref 0.5–1.5)
METHEMOGLOBIN: 0.8 % (ref 0.0–1.5)
O2 Saturation: 50 %
Total hemoglobin: 10.7 g/dL — ABNORMAL LOW (ref 13.5–18.0)

## 2014-07-30 MED ORDER — POTASSIUM CHLORIDE CRYS ER 20 MEQ PO TBCR
40.0000 meq | EXTENDED_RELEASE_TABLET | Freq: Once | ORAL | Status: AC
  Administered 2014-07-30: 40 meq via ORAL
  Filled 2014-07-30: qty 2

## 2014-07-30 MED ORDER — INSULIN GLARGINE 100 UNIT/ML ~~LOC~~ SOLN
20.0000 [IU] | Freq: Every day | SUBCUTANEOUS | Status: DC
  Administered 2014-07-30: 20 [IU] via SUBCUTANEOUS
  Filled 2014-07-30 (×2): qty 0.2

## 2014-07-30 MED ORDER — TORSEMIDE 20 MG PO TABS: 40.0000 mg | ORAL_TABLET | Freq: Every day | ORAL | Status: DC

## 2014-07-30 NOTE — Progress Notes (Signed)
The patient did not have any complaints of pain overnight and received Ambien.  His Milrinone drip ran without complication.  His blood sugar this morning was 54 and he has been given graham crackers and orange juice.  The RN will recheck his blood sugar in fifteen minutes.

## 2014-07-30 NOTE — Progress Notes (Addendum)
Subjective:  Patient was seen and examined this morning. He denies any complaints. He states she is breathing well, without cough or shortness of breath, no chest pain. He denies N/V/D.   Objective: Vital signs in last 24 hours: Filed Vitals:   07/29/14 1712 07/29/14 2010 07/29/14 2020 07/30/14 0502  BP: 130/64 121/70  116/62  Pulse:  60  66  Temp:  98.2 F (36.8 C)  98.2 F (36.8 C)  TempSrc:  Oral  Oral  Resp:  18  18  Height:      Weight:    93.26 kg (205 lb 9.6 oz)  SpO2:  99% 91% 96%   Weight change: -0.544 kg (-1 lb 3.2 oz)  Intake/Output Summary (Last 24 hours) at 07/30/14 0734 Last data filed at 07/30/14 0626  Gross per 24 hour  Intake    663 ml  Output   2175 ml  Net  -1512 ml   General: Vital signs reviewed.  Patient is well-developed and well-nourished, in no acute distress and cooperative with exam.  Cardiovascular: RRR, S1 normal, S2 normal, no murmurs, gallops, or rubs. Pulmonary/Chest: Clear to auscultation bilaterally, no wheezes, rales, or rhonchi. Abdominal: Soft, non-tender, non-distended, BS +, no masses, organomegaly, or guarding present.  Musculoskeletal: No joint deformities, erythema, or stiffness, ROM full and nontender. Extremities: No lower extremity edema bilaterally,  pulses symmetric and intact bilaterally. No cyanosis or clubbing. Skin: Warm, dry and intact. No rashes or erythema. Psychiatric: Normal mood and affect. speech and behavior is normal. Cognition and memory are normal.   Lab Results: Basic Metabolic Panel:  Recent Labs Lab 07/29/14 0348 07/30/14 0314  NA 138 136*  K 3.2* 3.5*  CL 91* 91*  CO2 33* 32  GLUCOSE 107* 62*  BUN 66* 66*  CREATININE 3.47* 3.80*  CALCIUM 9.3 9.4  MG 2.1  --      CBC:  Recent Labs Lab 07/24/14 0440 07/25/14 0422 07/26/14 0520  WBC 10.6* 10.7* 9.7  NEUTROABS 9.2* 8.0*  --   HGB 9.1* 10.2* 9.8*  HCT 27.9* 31.6* 30.4*  MCV 90.9 89.5 88.6  PLT 247 220 216    CBG:  Recent  Labs Lab 07/28/14 2104 07/29/14 0645 07/29/14 1028 07/29/14 1618 07/29/14 2108 07/30/14 0620  GLUCAP 222* 89 198* 212* 152* 57*    Studies/Results: No results found.  Medications: I have reviewed the patient's current medications. Medications Prior to Admission  Medication Sig Dispense Refill  . albuterol (PROVENTIL HFA;VENTOLIN HFA) 108 (90 BASE) MCG/ACT inhaler Inhale 2 puffs into the lungs every 6 (six) hours as needed for wheezing or shortness of breath.      Marland Kitchen aspirin 81 MG chewable tablet Chew 81 mg by mouth daily.      Marland Kitchen atorvastatin (LIPITOR) 40 MG tablet Take 1 tablet (40 mg total) by mouth daily.  30 tablet  0  . carvedilol (COREG) 25 MG tablet Take 25 mg by mouth 2 (two) times daily.      . cholecalciferol (VITAMIN D) 1000 UNITS tablet Take 2,000 Units by mouth daily.      Marland Kitchen edoxaban (SAVAYSA) 30 MG TABS tablet Take 30 mg by mouth daily.      . furosemide (LASIX) 40 MG tablet Take 40 mg by mouth 2 (two) times daily.      . hydrALAZINE (APRESOLINE) 50 MG tablet Take 50 mg by mouth 3 (three) times daily.      . insulin aspart (NOVOLOG) 100 UNIT/ML injection Inject 10 Units into the  skin 3 (three) times daily with meals.  10 mL  11  . insulin glargine (LANTUS) 100 UNIT/ML injection Inject 0.45 mLs (45 Units total) into the skin at bedtime.  10 mL  11  . isosorbide mononitrate (IMDUR) 60 MG 24 hr tablet Take 60 mg by mouth daily.      . mometasone-formoterol (DULERA) 200-5 MCG/ACT AERO Inhale 2 puffs into the lungs 2 (two) times daily.      . Multiple Vitamin (MULTIVITAMIN WITH MINERALS) TABS tablet Take 1 tablet by mouth daily.        Scheduled Meds: . amiodarone  400 mg Oral BID  . atorvastatin  40 mg Oral Daily  . carvedilol  6.25 mg Oral BID WC  . cholecalciferol  2,000 Units Oral Daily  . edoxaban  30 mg Oral Daily  . ferrous sulfate  325 mg Oral BID WC  . hydrALAZINE  75 mg Oral TID  . insulin aspart  0-9 Units Subcutaneous TID WC  . insulin aspart  3 Units  Subcutaneous TID WC  . insulin glargine  25 Units Subcutaneous QHS  . isosorbide mononitrate  60 mg Oral Daily  . mometasone-formoterol  2 puff Inhalation BID  . multivitamin with minerals  1 tablet Oral Daily  . sodium chloride  3 mL Intravenous Q12H  . sodium chloride  3 mL Intravenous Q12H  . sodium chloride  3 mL Intravenous Q12H  . torsemide  40 mg Oral BID   Continuous Infusions: . sodium chloride 10 mL/hr at 07/28/14 1829  . sodium chloride 500 mL (07/26/14 1318)  . sodium chloride 250 mL (07/26/14 1725)  . milrinone 0.125 mcg/kg/min (07/30/14 0506)   PRN Meds:.sodium chloride, sodium chloride, acetaminophen, albuterol, HYDROcodone-acetaminophen, ondansetron (ZOFRAN) IV, sodium chloride, sodium chloride, zolpidem Assessment/Plan: Active Problems:   Chronic kidney disease (CKD), stage IV (severe)   DM (diabetes mellitus), type 2, uncontrolled, with renal complications   Essential hypertension, benign   CAD (coronary artery disease)   Other and unspecified hyperlipidemia   Abnormal renal ultrasound with left renal lesion and bladder wall thickening   PAF (paroxysmal atrial fibrillation)   Lower extremity edema   Hypoglycemia   Acute renal failure superimposed on stage 4 chronic kidney disease   Acute on chronic combined systolic and diastolic heart failure   Syncope   Anemia  63 yo male with hx of Afib, CHF, HTN, CKD IV here with CHF exacerbation.  Acute exacerbation systolic and diastolic CH s/p ICD 2/0/2542, s/p RHC 9/29 F: Improving. primarily from ischemic cardiomyopathy. Weight down from 206>205. Net I/O -1,512 yesterday. DCCV for afib. Remains in NSR. Carboxyhemoglobin 2.3. Will continue milranone drip per cardiology. Dr. Haroldine Laws recommends holding torsemide for now, reevaluate tomorrow, suspects he will only need torsemide 40 mg daily in the future. -continue statin, continue carvedilol 6.25mg  BID,  -Will hold Torsemide 40 mg BID -Weaning off milrinone, may not be  tolerating wean -Likely d/c tomorrow, follow up with HF team outpatient -Cont daily weights, strict i/o's  - Not on ACE-I/ARB or Spiro with CKD  A fib: On edoxaban at home. Sinus rhythm after DCCV. -continue amiodarone 400mg  BID -continue edoxaban for now, switch to xarelto as outpatient if CSW cannot find a way to make it affordable  AKI on CKD, stage IV, history of renal cystic lesion: baseline Crt around 2.7. Crt going up, 3.4>3.8. Likely cardiorenal or overdiuresis. Cardiology recommends continuing diuresis, but Heart Failure team states to hold torsemide and that he likely only needs 40 mg  daily. -Hold Torsemide -Recommend renal ultrasound in 6-12 months since he cannot obtain an MRI due to his ICD  HTN: Normotensive. On coreg 25mg  BID, lasix 40mg  BID PO, hydralazine 50mg  TID, Imdur 60mg  daily at home.  -continue on hydralazine to 75mg  TID  -continue Imdur 60mg  daily  -continue Coreg 6.25mg  BID  Anemia: 2/2 chronic disease with Fe deficiency. FOBT positive. H/H stable around 9-10. -Ferrous sulfate 325mg  BID -Outpatient GI follow up  DM2: Glucose ranging 57-291. Patient had hypoglycemia event this morning down to 57. Patient was given graham crackers and orange juice with increase of glucose to 124. -Decreased Lantus to 20u QHS -Meal time 3 units correction  -Sensitive SSI   Asthma: on dulera 200/5 mcg and albuterol prn at home. Stable. -continue albuterol 2 puff Q6hr prn  -continue dulera 200/5 mcg 2 puff BID   FEN: H/H, carb modified  DVT ppx: SCDs, Edoxaban  Dispo: Disposition is deferred at this time, awaiting improvement of current medical problems. Anticipated discharge in approximately 3-5 day(s).   The patient does have a current PCP Tanya Nones, MD) and does need an [facility] hospital follow-up appointment after discharge.  The patient does not have transportation limitations that hinder transportation to clinic appointments.  .Services Needed at time of  discharge: Y = Yes, Blank = No PT: Outpatient cardiac rehab, home health PT  OT:   RN:   Equipment: Rolling walker with 5" wheels  Other:     LOS: 14 days   Osa Craver, DO PGY-1 Internal Medicine Resident Pager # [phone] 07/30/2014 7:36 AM

## 2014-07-30 NOTE — Progress Notes (Signed)
Pt. Seen and examined. Agree with the NP/PA-C note as written.  Good urine output, however, creatinine increased overnight on lower dose milrinone. Not sure that he will tolerate wean.  Check co-ox today off of PICC line - may need to go back up on milrinone. Continue diuresis.  Pixie Casino, MD, [facility] Attending Cardiologist [place]

## 2014-07-30 NOTE — Progress Notes (Signed)
    Subjective:  Less SOB  Objective:  Vital Signs in the last 24 hours: Temp:  [98.2 F (36.8 C)-98.4 F (36.9 C)] 98.2 F (36.8 C) (10/04 0502) Pulse Rate:  [60-68] 66 (10/04 0502) Resp:  [18] 18 (10/04 0502) BP: (116-135)/(62-70) 116/62 mmHg (10/04 0502) SpO2:  [91 %-99 %] 96 % (10/04 0502) Weight:  [205 lb 9.6 oz (93.26 kg)] 205 lb 9.6 oz (93.26 kg) (10/04 0502)  Intake/Output from previous day:  Intake/Output Summary (Last 24 hours) at 07/30/14 0755 Last data filed at 07/30/14 3818  Gross per 24 hour  Intake    663 ml  Output   2175 ml  Net  -1512 ml    Physical Exam: General appearance: alert, cooperative, no distress and moderately obese Lungs: clear to auscultation bilaterally Heart: regular rate and rhythm   Rate: 66  Rhythm: normal sinus rhythm  Lab Results: No results found for this basename: WBC, HGB, PLT,  in the last 72 hours  Recent Labs  07/29/14 0348 07/30/14 0314  NA 138 136*  K 3.2* 3.5*  CL 91* 91*  CO2 33* 32  GLUCOSE 107* 62*  BUN 66* 66*  CREATININE 3.47* 3.80*   No results found for this basename: TROPONINI, CK, MB,  in the last 72 hours No results found for this basename: INR,  in the last 72 hours  Imaging: Imaging results have been reviewed  Cardiac Studies:  Assessment/Plan:   Principal Problem:   Acute on chronic combined systolic and diastolic heart failure Active Problems:   PAF (paroxysmal atrial fibrillation)   Syncope   Chronic kidney disease (CKD), stage IV (severe)   DM (diabetes mellitus), type 2, uncontrolled, with renal complications   Essential hypertension, benign   CAD (coronary artery disease)   Acute renal failure superimposed on stage 4 chronic kidney disease   Anemia   Other and unspecified hyperlipidemia   Lower extremity edema   Hypoglycemia    PLAN: Weaning Milrinone. Hopefully home in AM, f/u with his cardiologist in [place].  Kerin Ransom PA-C Beeper [phone] 07/30/2014, 7:55 AM

## 2014-07-30 NOTE — Progress Notes (Signed)
Discussed with Dr Haroldine Laws- elevated Co-ox, continue Milrinone for now.  Kerin Ransom PA-C 07/30/2014 1:47 PM

## 2014-07-30 NOTE — Progress Notes (Signed)
Chart reviewed.   Weight continues to come down. Creatinine up again. Agree with checking co-ox. Will hold torsemide. Suspect he only may need 40mg  daily.   If co-ox ok can stop milrinone.   Daniel Bensimhon,MD 10:14 AM

## 2014-07-31 DIAGNOSIS — I10 Essential (primary) hypertension: Secondary | ICD-10-CM

## 2014-07-31 DIAGNOSIS — I480 Paroxysmal atrial fibrillation: Secondary | ICD-10-CM

## 2014-07-31 DIAGNOSIS — D649 Anemia, unspecified: Secondary | ICD-10-CM

## 2014-07-31 LAB — BASIC METABOLIC PANEL
Anion gap: 11 (ref 5–15)
BUN: 67 mg/dL — ABNORMAL HIGH (ref 6–23)
CALCIUM: 9.3 mg/dL (ref 8.4–10.5)
CO2: 33 mEq/L — ABNORMAL HIGH (ref 19–32)
CREATININE: 3.86 mg/dL — AB (ref 0.50–1.35)
Chloride: 93 mEq/L — ABNORMAL LOW (ref 96–112)
GFR, EST AFRICAN AMERICAN: 18 mL/min — AB (ref >90)
GFR, EST NON AFRICAN AMERICAN: 15 mL/min — AB (ref >90)
Glucose, Bld: 133 mg/dL — ABNORMAL HIGH (ref 70–99)
Potassium: 3.5 mEq/L — ABNORMAL LOW (ref 3.7–5.3)
SODIUM: 137 meq/L (ref 137–147)

## 2014-07-31 LAB — GLUCOSE, CAPILLARY
GLUCOSE-CAPILLARY: 137 mg/dL — AB (ref 70–99)
Glucose-Capillary: 82 mg/dL (ref 70–99)

## 2014-07-31 MED ORDER — AMIODARONE HCL 400 MG PO TABS: 400.0000 mg | ORAL_TABLET | Freq: Every day | ORAL | Status: DC

## 2014-07-31 MED ORDER — HYDRALAZINE HCL 25 MG PO TABS: 75.0000 mg | ORAL_TABLET | Freq: Three times a day (TID) | ORAL | Status: DC

## 2014-07-31 MED ORDER — TORSEMIDE 20 MG PO TABS: 40.0000 mg | ORAL_TABLET | Freq: Every day | ORAL | Status: DC

## 2014-07-31 MED ORDER — POTASSIUM CHLORIDE ER 20 MEQ PO TBCR: 40.0000 meq | EXTENDED_RELEASE_TABLET | Freq: Every day | ORAL | Status: DC

## 2014-07-31 MED ORDER — PREDNISONE 10 MG PO TABS: 10.0000 mg | ORAL_TABLET | Freq: Every day | ORAL | Status: DC

## 2014-07-31 MED ORDER — EDOXABAN TOSYLATE 30 MG PO TABS: 30.0000 mg | ORAL_TABLET | Freq: Every day | ORAL | Status: DC

## 2014-07-31 MED ORDER — POTASSIUM CHLORIDE CRYS ER 20 MEQ PO TBCR
40.0000 meq | EXTENDED_RELEASE_TABLET | Freq: Two times a day (BID) | ORAL | Status: DC
  Administered 2014-07-31: 40 meq via ORAL
  Filled 2014-07-31: qty 2

## 2014-07-31 MED ORDER — INSULIN ASPART 100 UNIT/ML ~~LOC~~ SOLN: 10.0000 [IU] | Freq: Three times a day (TID) | SUBCUTANEOUS | Status: DC

## 2014-07-31 MED ORDER — AMIODARONE HCL 200 MG PO TABS
400.0000 mg | ORAL_TABLET | Freq: Every day | ORAL | Status: DC
  Administered 2014-07-31: 400 mg via ORAL
  Filled 2014-07-31: qty 2

## 2014-07-31 MED ORDER — FERROUS SULFATE 325 (65 FE) MG PO TABS: 325.0000 mg | ORAL_TABLET | Freq: Two times a day (BID) | ORAL | Status: DC

## 2014-07-31 MED ORDER — INSULIN GLARGINE 100 UNIT/ML ~~LOC~~ SOLN: 25.0000 [IU] | Freq: Every day | SUBCUTANEOUS | Status: DC

## 2014-07-31 MED ORDER — TORSEMIDE 20 MG PO TABS
40.0000 mg | ORAL_TABLET | Freq: Every day | ORAL | Status: DC
  Administered 2014-07-31: 40 mg via ORAL
  Filled 2014-07-31 (×2): qty 2

## 2014-07-31 MED ORDER — POTASSIUM CHLORIDE CRYS ER 20 MEQ PO TBCR
40.0000 meq | EXTENDED_RELEASE_TABLET | Freq: Once | ORAL | Status: AC
  Administered 2014-07-31: 40 meq via ORAL

## 2014-07-31 MED ORDER — CARVEDILOL 6.25 MG PO TABS: 6.2500 mg | ORAL_TABLET | Freq: Two times a day (BID) | ORAL | Status: DC

## 2014-07-31 NOTE — Progress Notes (Signed)
Pharmacist HF Discharge Medication Education   Patient was educated on process for home medication reconciliation and provided written instructions as well. Patient was also counseled on HF medications and changes to be made upon arriving home. He was provided with the "Living Better with Heart Failure" patient education packet and a medication bag to be brought with him to his follow up clinic visits with his current medications. He did not have many questions for me and seemed to be in a hurry to go home. May need medication education reinforcement at follow up HF clinic visit.    Harolyn Rutherford, PharmD Clinical Pharmacist - Resident Pager: [phone] Pharmacy: [phone] 07/31/2014 2:41 PM

## 2014-07-31 NOTE — Progress Notes (Signed)
D/C'd IVs x2.  D/C'd tele.  D/C'd pt.  Explained discharge instructions and medications to pt, including date and time of next dose.  Explained any new medications to pt, that he was not taking prior to admission.  Discussed any changes in dose amounts of any medications that he was taking prior to admission.  Pt was in no acute distress, and had no further questions.

## 2014-07-31 NOTE — Progress Notes (Signed)
UR completed Tresten Pantoja [facility] [place], RN, BSN, MSHL, CCM  07/31/2014 11:12 AM

## 2014-07-31 NOTE — Progress Notes (Signed)
Patient ID: Tony Freeman, male   DOB: 07-30-1951, 63 y.o.   MRN: [id number]    Subjective:    Underwent TEE and DC-CV on 9/30. Maintaining NSR.   Over the weekend milrinone was cut back to 0.125 mcg. Overall weight down  32 pounds. Diuretics have been on hold since Oct 4th.   Denies SOB/Orthopnea.   RHC (9/29): RA 20 mmHg CI 2.13 (using RA sat)   Objective:   Temp:  [98.5 F (36.9 C)-98.6 F (37 C)] 98.5 F (36.9 C) (10/05 0656) Pulse Rate:  [59-68] 60 (10/05 0656) Resp:  [17] 17 (10/05 0656) BP: (118-129)/(60-76) 129/72 mmHg (10/05 0656) SpO2:  [95 %-98 %] 96 % (10/05 0656) Weight:  [204 lb 9.4 oz (92.8 kg)] 204 lb 9.4 oz (92.8 kg) (10/05 0656) Last BM Date: 07/30/14  Filed Weights   07/29/14 0633 07/30/14 0502 07/31/14 0656  Weight: 206 lb 12.8 oz (93.804 kg) 205 lb 9.6 oz (93.26 kg) 204 lb 9.4 oz (92.8 kg)    Intake/Output Summary (Last 24 hours) at 07/31/14 0742 Last data filed at 07/31/14 0646  Gross per 24 hour  Intake    720 ml  Output   1650 ml  Net   -930 ml    Telemetry: NSR 60s  Exam: General: Well appearing. No resp difficulty, sitting on the side of the bed.  HEENT: normal  Neck: supple. JVP 5-6 Carotids 2+ bilat; no bruits. No lymphadenopathy or thryomegaly appreciated.  Cor: PMI nondisplaced. regular S1S2. No rubs, gallops or murmurs.  Lungs: clear  Abdomen: Obese, soft, nontender, nondistended. No hepatosplenomegaly. No bruits or masses. Good bowel sounds.  Extremities: no cyanosis, clubbing, rash,  No edema  Neuro: alert & orientedx3, cranial nerves grossly intact. moves all 4 extremities w/o difficulty. Affect pleasant  Lab Results:  Basic Metabolic Panel:  Recent Labs Lab 07/29/14 0348 07/30/14 0314 07/31/14 0304  NA 138 136* 137  K 3.2* 3.5* 3.5*  CL 91* 91* 93*  CO2 33* 32 33*  GLUCOSE 107* 62* 133*  BUN 66* 66* 67*  CREATININE 3.47* 3.80* 3.86*  CALCIUM 9.3 9.4 9.3  MG 2.1  --   --     Liver Function Tests: No  results found for this basename: AST, ALT, ALKPHOS, BILITOT, PROT, ALBUMIN,  in the last 168 hours  CBC:  Recent Labs Lab 07/25/14 0422 07/26/14 0520  WBC 10.7* 9.7  HGB 10.2* 9.8*  HCT 31.6* 30.4*  MCV 89.5 88.6  PLT 220 216    Cardiac Enzymes: No results found for this basename: CKTOTAL, CKMB, CKMBINDEX, TROPONINI,  in the last 168 hours  BNP:  Recent Labs  07/16/14 1401  PROBNP 12348.0*     Medications:   Scheduled Medications: . amiodarone  400 mg Oral BID  . atorvastatin  40 mg Oral Daily  . carvedilol  6.25 mg Oral BID WC  . cholecalciferol  2,000 Units Oral Daily  . edoxaban  30 mg Oral Daily  . ferrous sulfate  325 mg Oral BID WC  . hydrALAZINE  75 mg Oral TID  . insulin aspart  0-9 Units Subcutaneous TID WC  . insulin aspart  3 Units Subcutaneous TID WC  . insulin glargine  20 Units Subcutaneous QHS  . isosorbide mononitrate  60 mg Oral Daily  . mometasone-formoterol  2 puff Inhalation BID  . multivitamin with minerals  1 tablet Oral Daily  . sodium chloride  3 mL Intravenous Q12H  . sodium chloride  3 mL Intravenous  Q12H  . sodium chloride  3 mL Intravenous Q12H    Infusions: . sodium chloride 10 mL/hr at 07/28/14 1829  . sodium chloride 500 mL (07/26/14 1318)  . sodium chloride 250 mL (07/26/14 1725)  . milrinone 0.125 mcg/kg/min (07/30/14 1500)    PRN Medications: sodium chloride, sodium chloride, acetaminophen, albuterol, HYDROcodone-acetaminophen, ondansetron (ZOFRAN) IV, sodium chloride, sodium chloride, zolpidem   Assessment/Plan/Discussion:   1. Acute on chronic systolic CHF: Primarily ischemic cardiomyopathy.  CHF likely worsened by atrial fibrillation. Started on IV lasix and milrinone. RHC showed RA pressure 20 on RHC with CI low but not markedly. - Volume status stable. Overall he diuresed 28 pounds. Renal function unchanged from yesterday. Start torsemide 40 mg twice a day.   - Continue current coreg and hydralazine to 75 mg TID and  continue Imdur 60 mg daily. - Not on ACE-I/ARB or Spiro with CKD 2. Atrial fibrillation: S/p DC-CV on 9/30. Now in NSR. Continue edoxaban. Concerned that after procedure patient is not going to be able to afford edoxaban since his primary cardiologist in [place] gave him samples and he only has 3 left. He needs to definitely be covered for 30 days post op and would like need indefinitely.  Provided 30 day free card for Edoxaban.   Nee 3. AKI on CKD: Creatinine  elevated d/t cardiorenal syndrome. Renal function remains elevated.  Needs follow up with nephrology.  4. CAD: Stable. Continue statin.  5. Hypokalemia - will supplement.  Daughter (HCPOA) [name] by phone and I will keep her updated.  [phone].   HF meds for d/c  Torsemide 40 mg once a day  Kdur 40 meq once a day Hydralazine 75 mg tid Imdur 60 mg daily Carvedilol 6.25 mg twice a day Lipitor 40 mg daily Amiodarone 400 mg daily  Edoxaban 30 mg daily ---> will need 30 day written script   Has follow up in HF clinic on Friday.     CLEGG,AMY NP-C   07/31/2014 7:42 AM   Patient seen and examined with Darrick Grinder, NP. We discussed all aspects of the encounter. I agree with the assessment and plan as stated above.   Looks good today. Can stop milrinone and send him home on above meds. Will switch lasix to demadex 40 daily. I suspect he will need frequent med titration. I reviewed his medications with him but he has poor insight. We will see back in Clinic this week. Check BMET and volume status. Low threshold for repeat RHC.  Undine Nealis,MD 9:03 AM

## 2014-07-31 NOTE — Progress Notes (Signed)
Physical Therapy Treatment Patient Details Name: Tony Freeman MRN: [id number] DOB: May 09, 1951 Today's Date: 07/31/2014    History of Present Illness Pt admitted with CHF exacerbation and R ankle gout.    PT Comments    Pt was seen for may be the final visit, has RW in his room for home.  Plan is to go home and continue outpatient per last PT note, pt is not interested in use of RW.  Pitched the use for safety but he may not comply.  Plan is appropriate for outpatient therapy to follow up.  Follow Up Recommendations  Outpatient PT;Supervision - Intermittent;Other (comment)     Equipment Recommendations  None recommended by PT    Recommendations for Other Services       Precautions / Restrictions Precautions Precautions: Fall Restrictions Weight Bearing Restrictions: No    Mobility  Bed Mobility Overal bed mobility: Modified Independent             General bed mobility comments: Got up when PT came over to assist him up  Transfers Overall transfer level: Modified independent Equipment used: Rolling walker (2 wheeled) Transfers: Sit to/from [facility] Sit to Stand: Min guard Stand pivot transfers: Min guard       General transfer comment: Lists back and to L during standing control without an assistive device  Ambulation/Gait Ambulation/Gait assistance: Min guard Ambulation Distance (Feet): 400 Feet Assistive device: Rolling walker (2 wheeled) Gait Pattern/deviations: Step-through pattern;Wide base of support;Drifts right/left;Decreased step length - right;Decreased step length - left;Decreased dorsiflexion - right;Decreased dorsiflexion - left Gait velocity: Decreased Gait velocity interpretation: Below normal speed for age/gender General Gait Details: Pt was reporting he didn't need walker yet was unsteady even with assistive device.  cues for safety negotiating obstacles since pt wants to hold walker up to clear things in the  way   Stairs            Wheelchair Mobility    Modified Rankin (Stroke Patients Only)       Balance Overall balance assessment: Needs assistance Sitting-balance support: Bilateral upper extremity supported Sitting balance-Leahy Scale: Good   Postural control: Posterior lean Standing balance support: Bilateral upper extremity supported Standing balance-Leahy Scale: Poor Standing balance comment: Had to control standing balance initially but is safer with RW.  Pt wants to avoid use of walker at home                    Cognition Arousal/Alertness: Awake/alert Behavior During Therapy: [facility] for tasks assessed/performed Overall Cognitive Status: Within Functional Limits for tasks assessed                      Exercises General Exercises - Lower Extremity Quad Sets: Strengthening;Both;10 reps Long Arc Quad: Strengthening;Both;10 reps Heel Slides: Strengthening;Both;10 reps Hip ABduction/ADduction: Strengthening;Both;10 reps    General Comments General comments (skin integrity, edema, etc.): Pt going home with family but not clear if they are going to be there in the daytime.  Pt will be expecting to take home a walker but not interested in using, dismissed PT concerns about going without it.      Pertinent Vitals/Pain Pain Assessment: No/denies pain Pain Score: 0-No pain    Home Living                      Prior Function            PT Goals (current goals can now be found  in the care plan section) Acute Rehab PT Goals Patient Stated Goal: Home today hopefully Progress towards PT goals: Progressing toward goals    Frequency  Min 3X/week    PT Plan Current plan remains appropriate    Co-evaluation             End of Session Equipment Utilized During Treatment: Gait belt Activity Tolerance: Patient tolerated treatment well Patient left: in chair;with call bell/phone within reach;with nursing/sitter in room     Time:  0912-0935 PT Time Calculation (min): 23 min  Charges:  $Gait Training: 8-22 mins $Therapeutic Exercise: 8-22 mins                    G Codes:      Ramond Dial 22-Aug-2014, 9:49 AM Mee Hives, PT MS Acute Rehab Dept. Number: [phone]

## 2014-07-31 NOTE — Progress Notes (Signed)
Patient ID: Tony Freeman, male   DOB: 1950-11-15, 63 y.o.   MRN: [id number] Attending medicine discharge note: I personally interviewed and examined this patient along with the medical team on the day of discharge. Medical history, physical findings, medications, hospital course, discharge summary and plan acute as recorded by resident physician Dr. Dellia Nims. Summary: 63 year old man with ischemic cardiomyopathy, estimated ejection fraction 25-30%, with recent placement of a dual-chamber ICD on September 3, atrial fibrillation/flutter, initially admitted on September 20 with decompensated congestive heart failure. He was treated with initial diuresis. He underwent DC cardioversion on September 30. He did go back into sinus rhythm. Additional inotropic agents were added to his regimen with milrinone. Diuretics were continued. His initial weight was 230 pounds. Weight at discharge 205 pounds. He was followed closely by the cardiology service. He will continue long-term anticoagulation currently with a edoxaban.  He was transitioned back to oral diuretics and will go home on torsemide 40 mg twice a day. He will continue Coreg 6.25 mg twice daily, hydralazine 75 mg 3 times daily, Imdur 60 mg daily. Amiodarone 400 mg daily will be added to his regimen. He will followup with his cardiologist in [place]. He will continue insulin for his diabetes Lantus 20 units each evening with NovoLog coverage at meals. Continue Dulera 2 puffs BID  Murriel Hopper, MD, FACP  Hematology-Oncology/Internal Medicine

## 2014-07-31 NOTE — Progress Notes (Signed)
Subjective: Doing well.  No complaints.  He denies CP, SOB, nausea/vomiting.  remains in NSR. Leg swelling improving. Breathing without any problem. Milrinone drip off.  Objective: Vital signs in last 24 hours: Filed Vitals:   07/30/14 1952 07/30/14 2032 07/31/14 0656 07/31/14 0938  BP:  118/76 129/72 119/85  Pulse:  59 60 60  Temp:  98.5 F (36.9 C) 98.5 F (36.9 C)   TempSrc:  Oral Oral   Resp:  17 17   Height:      Weight:   92.8 kg (204 lb 9.4 oz)   SpO2: 95% 98% 96%    Weight change: -0.46 kg (-1 lb 0.2 oz)  Intake/Output Summary (Last 24 hours) at 07/31/14 1233 Last data filed at 07/31/14 0956  Gross per 24 hour  Intake    840 ml  Output   1450 ml  Net   -610 ml   Vitals reviewed. General: resting in bed, NAD HEENT: PERRL, EOMI, no scleral icterus Cardiac: RRR, no rubs, murmurs or gallops Pulm: clear to auscultation bilaterally, no wheezes, rales, or rhonchi Abd: soft, nontender, nondistended, BS present Ext: warm and well perfused, trace pedal edema Neuro: alert and oriented X3, cranial nerves II-XII grossly intact, strength and sensation to light touch equal in bilateral upper and lower extremities  Lab Results: Basic Metabolic Panel:  Recent Labs Lab 07/29/14 0348 07/30/14 0314 07/31/14 0304  NA 138 136* 137  K 3.2* 3.5* 3.5*  CL 91* 91* 93*  CO2 33* 32 33*  GLUCOSE 107* 62* 133*  BUN 66* 66* 67*  CREATININE 3.47* 3.80* 3.86*  CALCIUM 9.3 9.4 9.3  MG 2.1  --   --      CBC:  Recent Labs Lab 07/25/14 0422 07/26/14 0520  WBC 10.7* 9.7  NEUTROABS 8.0*  --   HGB 10.2* 9.8*  HCT 31.6* 30.4*  MCV 89.5 88.6  PLT 220 216    Cardiac Enzymes: No results found for this basename: CKTOTAL, CKMB, CKMBINDEX, TROPONINI,  in the last 168 hours  CBG:  Recent Labs Lab 07/30/14 0706 07/30/14 1042 07/30/14 1611 07/30/14 2114 07/31/14 0643 07/31/14 1056  GLUCAP 124* 291* 192* 148* 82 137*    Studies/Results: No results  found.  Medications: I have reviewed the patient's current medications. Scheduled Meds: . amiodarone  400 mg Oral Daily  . atorvastatin  40 mg Oral Daily  . carvedilol  6.25 mg Oral BID WC  . cholecalciferol  2,000 Units Oral Daily  . edoxaban  30 mg Oral Daily  . ferrous sulfate  325 mg Oral BID WC  . hydrALAZINE  75 mg Oral TID  . insulin aspart  0-9 Units Subcutaneous TID WC  . insulin aspart  3 Units Subcutaneous TID WC  . insulin glargine  20 Units Subcutaneous QHS  . isosorbide mononitrate  60 mg Oral Daily  . mometasone-formoterol  2 puff Inhalation BID  . multivitamin with minerals  1 tablet Oral Daily  . potassium chloride  40 mEq Oral BID  . sodium chloride  3 mL Intravenous Q12H  . sodium chloride  3 mL Intravenous Q12H  . sodium chloride  3 mL Intravenous Q12H  . torsemide  40 mg Oral Daily   Continuous Infusions: . sodium chloride 10 mL/hr at 07/28/14 1829  . sodium chloride 500 mL (07/26/14 1318)  . sodium chloride Stopped (07/31/14 0947)   PRN Meds:.sodium chloride, sodium chloride, acetaminophen, albuterol, HYDROcodone-acetaminophen, ondansetron (ZOFRAN) IV, sodium chloride, sodium chloride, zolpidem Assessment/Plan: Principal Problem:  Acute on chronic combined systolic and diastolic heart failure Active Problems:   Chronic kidney disease (CKD), stage IV (severe)   DM (diabetes mellitus), type 2, uncontrolled, with renal complications   Essential hypertension, benign   CAD (coronary artery disease)   Other and unspecified hyperlipidemia   PAF (paroxysmal atrial fibrillation)   Lower extremity edema   Hypoglycemia   Acute renal failure superimposed on stage 4 chronic kidney disease   Syncope   Anemia  63 yo male with hx of Afib, CHF, HTN, CKD IV here with CHF exacerbation.  Acute exacerbation systolic and diastolic CHF - improving. ..  DCCV for afib. Remains in NSR. primarily from ischemic cardiomyopathy.   s/p ICD 06/29/2014, s/p RHC 9/29 RHC showed RA  pressure 20 on RHC with CI low but not markedly.- ECHO 6/15 EF  25-30%. TEE 9/30 showed EF 25%,. No thrombus.  204 LB today (admission 230lb) diuresing slowed -930 net - co-ox was obtained to evaluate cardiac function and was low but this was not from central line so it could falsely low. Diuretic was held yesterday as it was thought that she could be volume depleted causing low output from heart and also low co-ox. Was restarted today. Will need RHC again to determine cardiac output in the future. - Continue demadex 40mg  once daily for now. - weaned off milrinone. May need to go back on it but HF will decide that outpatient.  -continue statin, continue carvedilol 6.25mg  BID,  Hydralazine 75mg  TID, imdur 60mg  daily.  -Kdur 58meq once a day -cont daily weights, strict i/o's  - Not on ACE-I/ARB or Spiro with CKD  A fib: on edoxaban at home. -DCCV yesterday. Afib likely worsened CHF. Remains in NSR now. -continue amiodarone 400mg  BID -continue edoxaban for now. Provided 30 day free card for Edoxaban. - follow with HF for further med supplies.  AKI on CKD, stage IV, history of renal cystic lesion: baseline Crt around 2.7. Crt going up. Today 3.86 Likely cardiorenal or over diuresis. - will need nephrology follow up outpatient - has one per PCP.  -Recommend renal ultrasound in 6-12 months since he cannot obtain an MRI due to his ICD  HTN: normotensive here. On coreg 25mg  BID, lasix 40mg  BID PO, hydralazine 50mg  TID, Imdur 60mg  daily at home.  -continue on hydralazine to 75mg  TID  -continue Imdur 60mg  daily  -continue Coreg 6.25mg  BID  Anemia: 2/2 chronic disease with Fe deficiency. FOBT positive. H/h stable around 10.2 (baseline ~10).  -ferrous sulfate 325mg  BID -Outpatient GI follow up  DM2:  -lantus 25u QHS + meal time 3 units correction + sensitive SSI   Asthma: on dulera 200/5 mcg and albuterol prn at home. Stable. -continue albuterol 2 puff Q6hr prn  -continue dulera 200/5 mcg 2  puff BID   FEN: H/H, carb modified  DVT ppx: SCDs, edoxaban  Dispo: Disposition is deferred at this time, awaiting improvement of current medical problems. Anticipated discharge in approximately 3-5 day(s).   The patient does have a current PCP Tanya Nones, MD) and does need an [facility] hospital follow-up appointment after discharge.  The patient does not have transportation limitations that hinder transportation to clinic appointments.  .Services Needed at time of discharge: Y = Yes, Blank = No PT: Outpatient cardiac rehab, home health PT  OT:   RN:   Equipment: Rolling walker with 5" wheels  Other:     LOS: 15 days   Dellia Nims, MD 07/31/2014, 12:33 PM

## 2014-08-04 ENCOUNTER — Inpatient Hospital Stay (HOSPITAL_COMMUNITY): Payer: Medicare HMO

## 2014-08-06 NOTE — Progress Notes (Signed)
Patient ID: Tony Freeman, male   DOB: 1951-10-15, 63 y.o.   MRN: [id number] Primary Physician: Tanya Nones, MD  Primary Cardiologist: Osborne Oman - Dr. Kreg Shropshire. Deno Etienne (ICD)  Nephrologist:  Daughter [facility]) [name] by phone and I will keep her updated. [phone].   HPI: Mr. Fulford is a 63 year old man with history of atrial fibrillation now on edoxaban, CAD (s/p multiple stents) chronic combined CHF (echo 04/02/2014 with LV EF: 25-30%, diffuse hypokinesis), ischemic cardiomyopathy s/p dual chamber ICD placement 06/29/2014, DM2, CKD stage IV, asthma, HLD, abnormal renal US 03/2014 (followed by urology).   He also has a h/o PCI LAD/RCA in 2007, PCI LAD/RCA in 2009 at [facility], abnormal lexiscan stress test 06/2013 moderate reversible perfusion abnormality in anterior, septal and apical region, then PCI to LAD 08/2013. [facility] records indicate that coronary angio at that time also showed CTO of RCA and nonobstructive disease in the LCx. Echo at [place] at least back to 01/2013 showed EF 35-40%, then <25% in 10/6604 with diastolic dysfunction and elevated RVSP. During his 03/2014 admission at [facility], he was diagnosed with new onset atrial fib-vs-flutter. Echo showed false tendon in LV apex, moderate LVH, EF 25-30%, diffuse HK, mod LAE, trivial TR but no mention of pulm HTN. He also had one troponin that was 0.11 and the rest were negative, felt due to CKD and intracranial etiology with some encephalopathy. He was discharged on Coumadin (along with his Plavix that he was on from prior PCI/stroke). However, it's not totally clear what he did with the Coumadin as it was no longer on his list when he saw his primary cardiologist in the [place] system at the end of June. The patient thinks his cardiologist took him off it but the note doesn't reflect that. (The patient does have some cognitive difficulties since his stroke and lives with his son who manages his medicine.) [facility] CareEverywhere notes  between June/July/August indicate he had been in NSR. He went on to have ICD placement on 9/3. Reading their notes, it appears they were not aware of his 03/2014 diagnosis of atrial fibrillation until it was picked up on his ICD interrogation in early September at which time he was placed on Edoxaban. Biotronic interrogation confirms AF since ICD implant 06/29/14.  Admitted 07/16/14 with increased dyspnea. Found to be A fib RVR. Had TEE/DC-CV. Successful cardioversion completed. He required IV lasix and short term milrinone. He transitioned to torsemide 40 mg twice a day. He was on edoxaban prior to admit by cardiologist at [facility] and remained on edoxaban. He was discharged on amiodarone 400 mg daily, carvedilol 6.25 mg twice a day, and hydralazine 75 mg tid/imdur 60 mg daily.Discharged weight was 204 pounds.   He returns for follow up. Complaining of occasional nausea late a night. Complains of mild fatigue. Denies SOB/PND. + Orthopnea. Sleeps on 2 pillows. Weight at home 205 pounds. Taking edoxaban. No bleeding problems. Taking all medications. Followed by [facility].Marland Kitchen    ECHO 03/2014 EF 25-30%   Labs  07/21/14 TSH 0.413  07/31/14 K 3.5 Creatinine 3.86 Na 137   ROS: All systems negative except as listed in HPI, PMH and Problem List.  Past Medical History  Diagnosis Date  . Diabetes mellitus with nephropathy   . Systolic and diastolic CHF, chronic     a. Echo at [facility] 11/17/2013 Ef <25%, severe hypokinesis of LV, moderately dilated LA, grade IV/IV diastolic dysfunction with irreversible restrictive pattern of mitral inflow, severe pulm HTN (RVSP >61mmHg), 1-2+  MR  b. Echo at [facility] 04/02/14 EF 25-30%, moderate concentric LVH, diffuse hypokinesis, moderately dilated LA, no significant MR observed  . CAD (coronary artery disease)     a. PCI LAD/RCA in 2007  b. PCI LAD/RCA in 2009 at [facility]  c. PCI LAD 08/2013, CTO of RCA and nonobstructive disease in the LCx.  Marland Kitchen Hyperlipidemia   .  Stroke   . Dementia   . Ischemic cardiomyopathy     a. s/p Biotronik ICD 06/2014.  Marland Kitchen Retinopathy     bilateral  . CKD (chronic kidney disease), stage IV   . HTN (hypertension)   . Abnormal renal ultrasound     a. 03/2014: Abnormal renal ultrasound with left renal lesion and bladder wall thickening and microscopic hematuria. Multiple cysts on prior MRI. Given inability to use contrast, f/u imaging limited, repeat US 3 months with outpatient uro f/u.  Marland Kitchen Paroxysmal atrial fibrillation   . Paroxysmal atrial flutter     Current Outpatient Prescriptions  Medication Sig Dispense Refill  . albuterol (PROVENTIL HFA;VENTOLIN HFA) 108 (90 BASE) MCG/ACT inhaler Inhale 2 puffs into the lungs every 6 (six) hours as needed for wheezing or shortness of breath.      Marland Kitchen amiodarone (PACERONE) 400 MG tablet Take 1 tablet (400 mg total) by mouth daily.  30 tablet  0  . aspirin 81 MG chewable tablet Chew 81 mg by mouth daily.      Marland Kitchen atorvastatin (LIPITOR) 40 MG tablet Take 1 tablet (40 mg total) by mouth daily.  30 tablet  0  . carvedilol (COREG) 6.25 MG tablet Take 1 tablet (6.25 mg total) by mouth 2 (two) times daily with a meal.  90 tablet  3  . cholecalciferol (VITAMIN D) 1000 UNITS tablet Take 2,000 Units by mouth daily.      Marland Kitchen edoxaban (SAVAYSA) 30 MG TABS tablet Take 1 tablet (30 mg total) by mouth daily.  30 tablet  0  . ferrous sulfate 325 (65 FE) MG tablet Take 1 tablet (325 mg total) by mouth 2 (two) times daily with a meal.  90 tablet  3  . hydrALAZINE (APRESOLINE) 25 MG tablet Take 3 tablets (75 mg total) by mouth 3 (three) times daily.  90 tablet  0  . insulin aspart (NOVOLOG) 100 UNIT/ML injection Inject 10 Units into the skin 3 (three) times daily with meals.  10 mL  11  . insulin glargine (LANTUS) 100 UNIT/ML injection Inject 0.25 mLs (25 Units total) into the skin at bedtime.  10 mL  11  . isosorbide mononitrate (IMDUR) 60 MG 24 hr tablet Take 60 mg by mouth daily.      . mometasone-formoterol  (DULERA) 200-5 MCG/ACT AERO Inhale 2 puffs into the lungs 2 (two) times daily.      . Multiple Vitamin (MULTIVITAMIN WITH MINERALS) TABS tablet Take 1 tablet by mouth daily.      . potassium chloride 20 MEQ TBCR Take 40 mEq by mouth daily.  60 tablet  0  . predniSONE (DELTASONE) 10 MG tablet Take 1 tablet (10 mg total) by mouth daily with breakfast.  5 tablet  0  . torsemide (DEMADEX) 20 MG tablet Take 2 tablets (40 mg total) by mouth daily.  30 tablet  0   No current facility-administered medications for this encounter.     PHYSICAL EXAM: Filed Vitals:   08/07/14 0835  BP: 106/67  Pulse: 63  Resp: 18  Weight: 208 lb 8 oz (94.575 kg)  SpO2:  99%    General:  Well appearing. No resp difficulty. Ambulated in the clinic without difficulty HEENT: normal Neck: supple. JVP 5-6. Carotids 2+ bilaterally; no bruits. No lymphadenopathy or thryomegaly appreciated. Cor: PMI normal. Regular rate & rhythm. No rubs, gallops or murmurs. Lungs: clear Abdomen: soft, nontender, nondistended. No hepatosplenomegaly. No bruits or masses. Good bowel sounds. Extremities: no cyanosis, clubbing, rash, edema Neuro: alert & orientedx3, cranial nerves grossly intact. Moves all 4 extremities w/o difficulty. Affect pleasant.      ASSESSMENT & PLAN: 1. Chronic combined HF. Has Biotronik ICD. ECHO 03/2014 EF 25-30%  NYHA II. Volume status stable. Continue Torsemide 40 mg once a day and Kdur 40 meq once a day  Continue hydralazine 75 mg tid /Imdur 60 mg daily  Continue Carvedilol 6.25 mg twice a day. HR 63 will not up titrate No Ace /Spirodue to CKD.  Reinforced daily weights, low salt food choices, and daily weights.   2. PAF (07/26/14) S/P TEE no LAA thrombus and successful DC-CV  Device interrogated today. Remains in SR.  Cut back amiodarone to 200 mg daily  Continue Edoxaban 30 mg daily per Cardiologist at [facility].  3. CAD S/P PCI  On bb, statin. No aspirin as he is on edoxaban.  4. CKD Stage IV  Not  ace or spiro due to CKD. Needs follow up with Nephrology. Will set up.   Follow up 3 weeks with MD.

## 2014-08-07 ENCOUNTER — Ambulatory Visit (HOSPITAL_COMMUNITY)
Admission: RE | Admit: 2014-08-07 | Discharge: 2014-08-07 | Disposition: A | Discharge status: Home / Self Care | Payer: Medicare HMO | Source: Ambulatory Visit | Attending: Cardiology | Admitting: Cardiology

## 2014-08-07 ENCOUNTER — Encounter (HOSPITAL_COMMUNITY): Payer: Self-pay

## 2014-08-07 VITALS — BP 106/67 | HR 63 | Resp 18 | Wt 208.5 lb

## 2014-08-07 DIAGNOSIS — I129 Hypertensive chronic kidney disease with stage 1 through stage 4 chronic kidney disease, or unspecified chronic kidney disease: Secondary | ICD-10-CM | POA: Diagnosis not present

## 2014-08-07 DIAGNOSIS — Z79899 Other long term (current) drug therapy: Secondary | ICD-10-CM | POA: Diagnosis not present

## 2014-08-07 DIAGNOSIS — Z95810 Presence of automatic (implantable) cardiac defibrillator: Secondary | ICD-10-CM | POA: Diagnosis not present

## 2014-08-07 DIAGNOSIS — E785 Hyperlipidemia, unspecified: Secondary | ICD-10-CM | POA: Diagnosis not present

## 2014-08-07 DIAGNOSIS — I429 Cardiomyopathy, unspecified: Secondary | ICD-10-CM | POA: Insufficient documentation

## 2014-08-07 DIAGNOSIS — Z955 Presence of coronary angioplasty implant and graft: Secondary | ICD-10-CM | POA: Insufficient documentation

## 2014-08-07 DIAGNOSIS — N184 Chronic kidney disease, stage 4 (severe): Secondary | ICD-10-CM | POA: Insufficient documentation

## 2014-08-07 DIAGNOSIS — I2510 Atherosclerotic heart disease of native coronary artery without angina pectoris: Secondary | ICD-10-CM | POA: Diagnosis not present

## 2014-08-07 DIAGNOSIS — I5042 Chronic combined systolic (congestive) and diastolic (congestive) heart failure: Secondary | ICD-10-CM | POA: Insufficient documentation

## 2014-08-07 DIAGNOSIS — I480 Paroxysmal atrial fibrillation: Secondary | ICD-10-CM | POA: Diagnosis present

## 2014-08-07 LAB — BASIC METABOLIC PANEL
Anion gap: 14 (ref 5–15)
BUN: 79 mg/dL — AB (ref 6–23)
CHLORIDE: 95 meq/L — AB (ref 96–112)
CO2: 28 mEq/L (ref 19–32)
Calcium: 9.8 mg/dL (ref 8.4–10.5)
Creatinine, Ser: 3.96 mg/dL — ABNORMAL HIGH (ref 0.50–1.35)
GFR calc Af Amer: 17 mL/min — ABNORMAL LOW (ref >90)
GFR calc non Af Amer: 15 mL/min — ABNORMAL LOW (ref >90)
GLUCOSE: 217 mg/dL — AB (ref 70–99)
Potassium: 4.3 mEq/L (ref 3.7–5.3)
Sodium: 137 mEq/L (ref 137–147)

## 2014-08-07 MED ORDER — AMIODARONE HCL 200 MG PO TABS: 200.0000 mg | ORAL_TABLET | Freq: Every day | ORAL | Status: DC

## 2014-08-07 NOTE — Patient Instructions (Addendum)
Reduce amiodarone to 200mg  once daily. Take 1/2 of the tablets you have now, then when you pick up your new prescription it will be the correct dosed 200mg  tablet and you can take 1 whole of those tablets.  Follow up with your doctor at [facility] at your convenience.  Follow up with our doctor in 3 weeks.  Do the following things EVERYDAY: 1) Weigh yourself in the morning before breakfast. Write it down and keep it in a log. 2) Take your medicines as prescribed 3) Eat low salt foods-Limit salt (sodium) to 2000 mg per day.  4) Stay as active as you can everyday 5) Limit all fluids for the day to less than 2 liters

## 2014-08-22 ENCOUNTER — Encounter: Payer: Self-pay | Admitting: Internal Medicine

## 2014-08-30 ENCOUNTER — Other Ambulatory Visit: Payer: Self-pay | Admitting: Internal Medicine

## 2014-08-30 ENCOUNTER — Inpatient Hospital Stay (HOSPITAL_COMMUNITY): Admission: RE | Admit: 2014-08-30 | Payer: Medicare HMO | Source: Ambulatory Visit

## 2014-09-13 ENCOUNTER — Encounter (HOSPITAL_COMMUNITY): Payer: Medicare HMO

## 2014-09-15 ENCOUNTER — Encounter (HOSPITAL_COMMUNITY): Payer: Medicare HMO

## 2014-09-28 ENCOUNTER — Ambulatory Visit (HOSPITAL_COMMUNITY)
Admission: RE | Admit: 2014-09-28 | Discharge: 2014-09-28 | Disposition: A | Discharge status: Home / Self Care | Payer: Medicare HMO | Source: Ambulatory Visit | Attending: Cardiology | Admitting: Cardiology

## 2014-09-28 VITALS — BP 124/64 | HR 72 | Wt 206.2 lb

## 2014-09-28 DIAGNOSIS — E1121 Type 2 diabetes mellitus with diabetic nephropathy: Secondary | ICD-10-CM | POA: Diagnosis not present

## 2014-09-28 DIAGNOSIS — R05 Cough: Secondary | ICD-10-CM | POA: Insufficient documentation

## 2014-09-28 DIAGNOSIS — Z79899 Other long term (current) drug therapy: Secondary | ICD-10-CM | POA: Insufficient documentation

## 2014-09-28 DIAGNOSIS — Z8673 Personal history of transient ischemic attack (TIA), and cerebral infarction without residual deficits: Secondary | ICD-10-CM | POA: Diagnosis not present

## 2014-09-28 DIAGNOSIS — I5042 Chronic combined systolic (congestive) and diastolic (congestive) heart failure: Secondary | ICD-10-CM | POA: Diagnosis present

## 2014-09-28 DIAGNOSIS — Z7982 Long term (current) use of aspirin: Secondary | ICD-10-CM | POA: Diagnosis not present

## 2014-09-28 DIAGNOSIS — I480 Paroxysmal atrial fibrillation: Secondary | ICD-10-CM | POA: Diagnosis not present

## 2014-09-28 DIAGNOSIS — E785 Hyperlipidemia, unspecified: Secondary | ICD-10-CM | POA: Diagnosis not present

## 2014-09-28 DIAGNOSIS — J45909 Unspecified asthma, uncomplicated: Secondary | ICD-10-CM | POA: Diagnosis not present

## 2014-09-28 DIAGNOSIS — N184 Chronic kidney disease, stage 4 (severe): Secondary | ICD-10-CM | POA: Insufficient documentation

## 2014-09-28 DIAGNOSIS — I2510 Atherosclerotic heart disease of native coronary artery without angina pectoris: Secondary | ICD-10-CM | POA: Diagnosis not present

## 2014-09-28 DIAGNOSIS — I129 Hypertensive chronic kidney disease with stage 1 through stage 4 chronic kidney disease, or unspecified chronic kidney disease: Secondary | ICD-10-CM | POA: Insufficient documentation

## 2014-09-28 DIAGNOSIS — R55 Syncope and collapse: Secondary | ICD-10-CM | POA: Insufficient documentation

## 2014-09-28 DIAGNOSIS — Z794 Long term (current) use of insulin: Secondary | ICD-10-CM | POA: Diagnosis not present

## 2014-09-28 DIAGNOSIS — F0390 Unspecified dementia without behavioral disturbance: Secondary | ICD-10-CM | POA: Diagnosis not present

## 2014-09-28 DIAGNOSIS — I255 Ischemic cardiomyopathy: Secondary | ICD-10-CM | POA: Insufficient documentation

## 2014-09-28 MED ORDER — HYDRALAZINE HCL 50 MG PO TABS: 75.0000 mg | ORAL_TABLET | Freq: Three times a day (TID) | ORAL | Status: DC

## 2014-09-28 MED ORDER — POTASSIUM CHLORIDE ER 10 MEQ PO TBCR: 20.0000 meq | EXTENDED_RELEASE_TABLET | Freq: Every day | ORAL | Status: DC

## 2014-09-28 NOTE — Progress Notes (Signed)
Patient ID: Tony Freeman, male   DOB: [birth year], 63 y.o.   MRN: [id number] Primary Physician: Tanya Nones, MD  Primary Cardiologist: Osborne Oman - Dr. Kreg Shropshire. Deno Etienne (ICD)  Nephrologist:  Daughter [facility]) [name] by phone and I will keep her updated. [phone].  HPI: Tony Freeman is a 63 year old man with history of atrial fibrillation now on edoxaban, CAD (s/p multiple stents) chronic combined CHF (echo 04/02/2014 with LV EF: 25-30%, diffuse hypokinesis), ischemic cardiomyopathy s/p dual chamber Biotronik ICD placement 06/29/2014, DM2, CKD stage IV, asthma, HLD, abnormal renal US 03/2014 (followed by urology).   He also has a h/o PCI LAD/RCA in 2007, PCI LAD/RCA in 2009 at [facility], abnormal lexiscan stress test 06/2013 moderate reversible perfusion abnormality in anterior, septal and apical region, then PCI to LAD 08/2013. [facility] records indicate that coronary angio at that time also showed CTO of RCA and nonobstructive disease in the LCx. Echo at [place] at least back to 01/2013 showed EF 35-40%, then <25% in 0/7622 with diastolic dysfunction and elevated RVSP. During his 03/2014 admission at [facility], he was diagnosed with new onset atrial fib-vs-flutter. Echo showed false tendon in LV apex, moderate LVH, EF 25-30%, diffuse HK, mod LAE, trivial TR but no mention of pulm HTN. He also had one troponin that was 0.11 and the rest were negative, felt due to CKD and intracranial etiology with some encephalopathy. He was discharged on Coumadin (along with his Plavix that he was on from prior PCI/stroke). However, it's not totally clear what he did with the Coumadin as it was no longer on his list when he saw his primary cardiologist in the [place] system at the end of June. The patient thinks his cardiologist took him off it but the note doesn't reflect that. (The patient does have some cognitive difficulties since his stroke and lives with his son who manages his medicine.) Interim CareEverywhere  notes between June/July/August indicate he had been in NSR. He went on to have ICD placement on 9/3. Reading their notes, it appears they were not aware of his 03/2014 diagnosis of atrial fibrillation until it was picked up on his ICD interrogation in early September at which time he was placed on Edoxaban. Biotronic interrogation confirms AF since ICD implant 06/29/14.  Admitted 07/16/14 with increased dyspnea. Found to be A fib RVR. Had TEE/DC-CV. TEE showed EF 25%, RV mildly dilated and moderately to severely dysfunctional.  Successful cardioversion completed. He required IV lasix and short term milrinone. He transitioned to torsemide 40 mg twice a day. He was on edoxaban prior to admit by cardiologist at [facility] and remained on edoxaban. He was discharged on amiodarone 400 mg daily, carvedilol 6.25 mg twice a day, and hydralazine 50 mg tid/imdur 60 mg daily. Discharged weight was 204 pounds.   He returns for follow up. Last visit amiodarone was cut back to 100 mg once a day. Yesterday he saw Dr Shirlee More cardiologist and he was changed him from torsemide to bumex 1 mg bid. A week ago he had coughing episode and passed out.  He has had a history of lightheadedness with coughing. Last night he had an episode of coughing but did not pass out. Weight at home 203-209 pounds, he is down 2 lbs on our scale. SOB with inclined surfaces and walking more than about 50 feet. No orthopnea or PND. Denies CP  Following low salt diet. Not following fluid restrictions.     Biotronik ICD checked today, no arrhythmic events to explain syncope.  ECHO 03/2014 EF 25-30%   Labs  07/21/14 TSH 0.413  10/15 K 3.5 => 4.3 Creatinine 3.86 => 3.96, Na 137   ECG: NSR, IVCD 126 msec  ROS: All systems negative except as listed in HPI, PMH and Problem List.  Past Medical History  Diagnosis Date  . Diabetes mellitus with nephropathy   . Systolic and diastolic CHF, chronic     a. Echo at [facility] 11/17/2013 Ef <25%, severe  hypokinesis of LV, moderately dilated LA, grade IV/IV diastolic dysfunction with irreversible restrictive pattern of mitral inflow, severe pulm HTN (RVSP >54mmHg), 1-2+ MR  b. Echo at [facility] 04/02/14 EF 25-30%, moderate concentric LVH, diffuse hypokinesis, moderately dilated LA, no significant MR observed  . CAD (coronary artery disease)     a. PCI LAD/RCA in 2007  b. PCI LAD/RCA in 2009 at [facility]  c. PCI LAD 08/2013, CTO of RCA and nonobstructive disease in the LCx.  Marland Kitchen Hyperlipidemia   . Stroke   . Dementia   . Ischemic cardiomyopathy     a. s/p Biotronik ICD 06/2014.  Marland Kitchen Retinopathy     bilateral  . CKD (chronic kidney disease), stage IV   . HTN (hypertension)   . Abnormal renal ultrasound     a. 03/2014: Abnormal renal ultrasound with left renal lesion and bladder wall thickening and microscopic hematuria. Multiple cysts on prior MRI. Given inability to use contrast, f/u imaging limited, repeat US 3 months with outpatient uro f/u.  Marland Kitchen Paroxysmal atrial fibrillation   . Paroxysmal atrial flutter     Current Outpatient Prescriptions  Medication Sig Dispense Refill  . albuterol (PROVENTIL HFA;VENTOLIN HFA) 108 (90 BASE) MCG/ACT inhaler Inhale 2 puffs into the lungs every 6 (six) hours as needed for wheezing or shortness of breath.    Marland Kitchen amiodarone (PACERONE) 200 MG tablet Take 100 mg by mouth daily.    Marland Kitchen aspirin 81 MG chewable tablet Chew 81 mg by mouth daily.    Marland Kitchen atorvastatin (LIPITOR) 40 MG tablet Take 1 tablet (40 mg total) by mouth daily. 30 tablet 0  . bumetanide (BUMEX) 1 MG tablet Take 1 mg by mouth 2 (two) times daily.    . carvedilol (COREG) 6.25 MG tablet Take 1 tablet (6.25 mg total) by mouth 2 (two) times daily with a meal. 90 tablet 3  . cholecalciferol (VITAMIN D) 1000 UNITS tablet Take 2,000 Units by mouth daily.    Marland Kitchen edoxaban (SAVAYSA) 30 MG TABS tablet Take 1 tablet (30 mg total) by mouth daily. 30 tablet 0  . ferrous sulfate 325 (65 FE) MG tablet Take 1 tablet (325 mg  total) by mouth 2 (two) times daily with a meal. 90 tablet 3  . hydrALAZINE (APRESOLINE) 50 MG tablet Take 50 mg by mouth 3 (three) times daily.    . insulin aspart (NOVOLOG) 100 UNIT/ML injection Inject 10 Units into the skin 3 (three) times daily with meals. 10 mL 11  . insulin glargine (LANTUS) 100 UNIT/ML injection Inject 0.25 mLs (25 Units total) into the skin at bedtime. 10 mL 11  . isosorbide mononitrate (IMDUR) 60 MG 24 hr tablet Take 60 mg by mouth daily.    . mometasone-formoterol (DULERA) 200-5 MCG/ACT AERO Inhale 2 puffs into the lungs 2 (two) times daily.    . Multiple Vitamin (MULTIVITAMIN WITH MINERALS) TABS tablet Take 1 tablet by mouth daily.    . potassium chloride 20 MEQ TBCR Take 40 mEq by mouth daily. 60 tablet 0   No current  facility-administered medications for this encounter.     PHYSICAL EXAM: Filed Vitals:   09/28/14 0857  BP: 124/64  Pulse: 72  Weight: 206 lb 4 oz (93.554 kg)  SpO2: 95%    General:  Well appearing. No resp difficulty. Ambulated in the clinic without difficulty HEENT: normal Neck: supple. JVP 10-11 cm. Carotids 2+ bilaterally; no bruits. No lymphadenopathy or thryomegaly appreciated. Cor: PMI normal. Regular rate & rhythm. No rubs, gallops or murmurs. Lungs: clear Abdomen: soft, nontender, nondistended. No hepatosplenomegaly. No bruits or masses. Good bowel sounds. Extremities: no cyanosis, clubbing, rash.  1+ edema to knees bilaterally.  Neuro: alert & orientedx3, cranial nerves grossly intact. Moves all 4 extremities w/o difficulty. Affect pleasant.  ASSESSMENT & PLAN: 1. Chronic combined HF: Ischemic cardiomyopathy. Has Biotronik ICD. ECHO 03/2014 EF 25-30%. TEE 9/15 with EF 25%, mildly dilated RV with moderate to severe systolic dysfunction.  NYHA III symptoms. He is volume overloaded on exam.   - He was just started on bumetanide 1 mg bid yesterday.  Will follow to see how he does on this.  As above, he is volume overloaded.  Will get  BMET at followup in 2 wks.  - Increase hydralazine 75 mg tid, continue Imdur 60 mg daily  - Continue Carvedilol 6.25 mg twice a day.  - No ACEI/Spironolactone due to CKD.  - Reinforced daily weights, low salt food choices, and daily weights.   - IVCD 126 msec on ECG, probably not wide enough QRS to benefit much from CRT upgrade.  2. PAF (07/26/14) S/P TEE no LAA thrombus and successful DC-CV: He remains in NSR today on low dose amiodarone 100 mg daily.  - With amioadrone, will need to follow LFTs and TSH.  Will arrange to check at appt in 2 wks.   - Continue edoxaban 30 mg daily (renally dosed).  He has been > 1 year since last PCI, will stop ASA 81.  3. CAD S/P PCI: Last intervention in 11/14. On beta blocker and statin. No aspirin as he is on edoxaban and > 1 year since last PCI. Refer to cardiac rehab.  4. CKD Stage IV: No ACEI or spironolactone due to CKD. Needs follow up with Nephrology. Has follow up scheduled 10/10/14 with Dr Florene Glen.  5. Asthma: PCP managing. Now on nebulizer.  6. Syncope: Suspect cough syncope (vagal event).  He has had lightheadedness and presyncope with cough multiple times in past.  ICD interrogation showed no VT/vfib.  Will need to be aggressive with cough suppression in the future.   Will contact [facility] cardiology office for lab work obtained yesterday. Follow up in 2 weeks.   CLEGG,AMY 9:54 AM   Patient seen with NP, agree with the above note.  I have made changes to the above note to reflect my thoughts.  Will increase hydralazine today and will get labs from outside office that were done yesterday.  Remains in NSR on amiodarone, will need screening labs for this at next visit.  Will not change diuretic today as just changed yesterday but he is volume overloaded and will bring him back for reassessment in 2 wks. Suspect syncope was vagal from coughing, need to aggressively suppress in future.   [name] 09/28/2014 11:52 AM

## 2014-09-28 NOTE — Patient Instructions (Signed)
Stop Aspirin  Increase Hydralazine to 75 mg (1 & 1/2 tabs) Three times a day   Change Potassium to 10 meq take 2 tabs daily, a new prescription has been sent in  You have been referred to Cardiac Rehab  Your physician recommends that you schedule a follow-up appointment in: 2 weeks

## 2014-09-29 NOTE — Addendum Note (Signed)
Encounter addended by: Georga Kaufmann, CCT on: 09/29/2014 11:26 AM<BR>     Documentation filed: Charges VN

## 2014-10-05 ENCOUNTER — Encounter (HOSPITAL_COMMUNITY): Payer: Self-pay | Admitting: Internal Medicine

## 2014-10-09 ENCOUNTER — Other Ambulatory Visit: Payer: Self-pay | Admitting: Gastroenterology

## 2014-10-09 DIAGNOSIS — R195 Other fecal abnormalities: Secondary | ICD-10-CM

## 2014-10-13 ENCOUNTER — Ambulatory Visit (INDEPENDENT_AMBULATORY_CARE_PROVIDER_SITE_OTHER): Payer: Medicare PPO | Admitting: Ophthalmology

## 2014-10-16 ENCOUNTER — Encounter (HOSPITAL_COMMUNITY): Payer: Medicare HMO

## 2014-10-17 ENCOUNTER — Ambulatory Visit
Admission: RE | Admit: 2014-10-17 | Discharge: 2014-10-17 | Disposition: A | Discharge status: Home / Self Care | Payer: Medicare HMO | Source: Ambulatory Visit | Attending: Gastroenterology | Admitting: Gastroenterology

## 2014-10-17 DIAGNOSIS — R195 Other fecal abnormalities: Secondary | ICD-10-CM

## 2014-10-24 ENCOUNTER — Telehealth (HOSPITAL_COMMUNITY): Payer: Self-pay | Admitting: *Deleted

## 2014-11-01 ENCOUNTER — Ambulatory Visit (INDEPENDENT_AMBULATORY_CARE_PROVIDER_SITE_OTHER): Payer: Medicare PPO | Admitting: Ophthalmology

## 2014-11-01 DIAGNOSIS — E11359 Type 2 diabetes mellitus with proliferative diabetic retinopathy without macular edema: Secondary | ICD-10-CM

## 2014-11-01 DIAGNOSIS — H43813 Vitreous degeneration, bilateral: Secondary | ICD-10-CM

## 2014-11-01 DIAGNOSIS — H35033 Hypertensive retinopathy, bilateral: Secondary | ICD-10-CM

## 2014-11-01 DIAGNOSIS — H2513 Age-related nuclear cataract, bilateral: Secondary | ICD-10-CM

## 2014-11-01 DIAGNOSIS — E11319 Type 2 diabetes mellitus with unspecified diabetic retinopathy without macular edema: Secondary | ICD-10-CM

## 2014-11-01 DIAGNOSIS — I10 Essential (primary) hypertension: Secondary | ICD-10-CM

## 2014-11-17 ENCOUNTER — Encounter (HOSPITAL_COMMUNITY): Payer: Medicare HMO

## 2014-11-24 ENCOUNTER — Ambulatory Visit (HOSPITAL_COMMUNITY)
Admission: RE | Admit: 2014-11-24 | Discharge: 2014-11-24 | Disposition: A | Discharge status: Home / Self Care | Payer: Medicare HMO | Source: Ambulatory Visit | Attending: Adult Health | Admitting: Adult Health

## 2014-11-24 VITALS — BP 118/62 | HR 76 | Wt 189.2 lb

## 2014-11-24 DIAGNOSIS — J45909 Unspecified asthma, uncomplicated: Secondary | ICD-10-CM | POA: Diagnosis not present

## 2014-11-24 DIAGNOSIS — N184 Chronic kidney disease, stage 4 (severe): Secondary | ICD-10-CM

## 2014-11-24 DIAGNOSIS — I2510 Atherosclerotic heart disease of native coronary artery without angina pectoris: Secondary | ICD-10-CM | POA: Insufficient documentation

## 2014-11-24 DIAGNOSIS — R0789 Other chest pain: Secondary | ICD-10-CM

## 2014-11-24 DIAGNOSIS — R55 Syncope and collapse: Secondary | ICD-10-CM | POA: Insufficient documentation

## 2014-11-24 DIAGNOSIS — I480 Paroxysmal atrial fibrillation: Secondary | ICD-10-CM

## 2014-11-24 DIAGNOSIS — I5042 Chronic combined systolic (congestive) and diastolic (congestive) heart failure: Secondary | ICD-10-CM

## 2014-11-24 LAB — HEPATIC FUNCTION PANEL
ALK PHOS: 187 U/L — AB (ref 39–117)
ALT: 22 U/L (ref 0–53)
AST: 34 U/L (ref 0–37)
Albumin: 3.4 g/dL — ABNORMAL LOW (ref 3.5–5.2)
BILIRUBIN TOTAL: 1.8 mg/dL — AB (ref 0.3–1.2)
Bilirubin, Direct: 0.6 mg/dL — ABNORMAL HIGH (ref 0.0–0.5)
Indirect Bilirubin: 1.2 mg/dL — ABNORMAL HIGH (ref 0.3–0.9)
TOTAL PROTEIN: 7.1 g/dL (ref 6.0–8.3)

## 2014-11-24 LAB — TSH: TSH: 1.198 u[IU]/mL (ref 0.350–4.500)

## 2014-11-24 NOTE — Progress Notes (Signed)
Patient ID: Tony Freeman, male   DOB: Jun 27, 1951, 64 y.o.   MRN: [id number] Primary Physician: Tanya Nones, MD  Primary Cardiologist: Osborne Oman - Dr. Kreg Shropshire. Deno Etienne (ICD)  Nephrologist:  Daughter [facility]) [name] by phone and I will keep her updated. [phone].  HPI: [name] is a 64 year old man with history of atrial fibrillation now on edoxaban, CAD (s/p multiple stents) chronic combined CHF (echo 04/02/2014 with LV EF: 25-30%, diffuse hypokinesis), ischemic cardiomyopathy s/p dual chamber Biotronik ICD placement 06/29/2014, DM2, CKD stage IV, asthma, HLD, abnormal renal US 03/2014 (followed by urology).   He also has a h/o PCI LAD/RCA in 2007, PCI LAD/RCA in 2009 at [facility], abnormal lexiscan stress test 06/2013 moderate reversible perfusion abnormality in anterior, septal and apical region, then PCI to LAD 08/2013. [facility] records indicate that coronary angio at that time also showed CTO of RCA and nonobstructive disease in the LCx. Echo at [facility] at least back to 01/2013 showed EF 35-40%, then <25% in 10/9507 with diastolic dysfunction and elevated RVSP. During his 03/2014 admission at [facility], he was diagnosed with new onset atrial fib-vs-flutter. Echo showed false tendon in LV apex, moderate LVH, EF 25-30%, diffuse HK, mod LAE, trivial TR but no mention of pulm HTN. He also had one troponin that was 0.11 and the rest were negative, felt due to CKD and intracranial etiology with some encephalopathy. He was discharged on Coumadin (along with his Plavix that he was on from prior PCI/stroke). However, it's not totally clear what he did with the Coumadin as it was no longer on his list when he saw his primary cardiologist in the [facility] system at the end of June. The patient thinks his cardiologist took him off it but the note doesn't reflect that. (The patient does have some cognitive difficulties since his stroke and lives with his son who manages his medicine.) [facility]Everywhere  notes between June/July/August indicate he had been in NSR. He went on to have ICD placement on 9/3. Reading their notes, it appears they were not aware of his 03/2014 diagnosis of atrial fibrillation until it was picked up on his ICD interrogation in early September at which time he was placed on Edoxaban. Biotronic interrogation confirms AF since ICD implant 06/29/14.  Admitted 07/16/14 with increased dyspnea. Found to be A fib RVR. Had TEE/DC-CV. TEE showed EF 25%, RV mildly dilated and moderately to severely dysfunctional.  Successful cardioversion completed. He required IV lasix and short term milrinone. He transitioned to torsemide 40 mg twice a day. He was on edoxaban prior to admit by cardiologist at [facility] and remained on edoxaban. He was discharged on amiodarone 400 mg daily, carvedilol 6.25 mg twice a day, and hydralazine 50 mg tid/imdur 60 mg daily. Discharged weight was 204 pounds.   He returns for follow up. Last visit hydralazine was increased to 75 mg three times day. Earlier this week he had chest pain at rest. Says it went away on its own. Limited mobility due to fatigue. Breathing better. Denies PND/Orhtopnea/syncope. Not weighing at home. Appetite ok. Completed home health. Taking all medications. Following low salt diet. Not following fluid restrictions.    ECHO 03/2014 EF 25-30%   Labs  07/21/14 TSH 0.413  10/15 K 3.5 => 4.3 Creatinine 3.86 => 3.96, Na 137   ROS: All systems negative except as listed in HPI, PMH and Problem List.  Past Medical History  Diagnosis Date  . Diabetes mellitus with nephropathy   . Systolic and diastolic CHF,  chronic     a. Echo at [facility] 11/17/2013 Ef <25%, severe hypokinesis of LV, moderately dilated LA, grade IV/IV diastolic dysfunction with irreversible restrictive pattern of mitral inflow, severe pulm HTN (RVSP >79mmHg), 1-2+ MR  b. Echo at [facility] 04/02/14 EF 25-30%, moderate concentric LVH, diffuse hypokinesis, moderately dilated LA, no significant MR  observed  . CAD (coronary artery disease)     a. PCI LAD/RCA in 2007  b. PCI LAD/RCA in 2009 at [facility]  c. PCI LAD 08/2013, CTO of RCA and nonobstructive disease in the LCx.  Marland Kitchen Hyperlipidemia   . Stroke   . Dementia   . Ischemic cardiomyopathy     a. s/p Biotronik ICD 06/2014.  Marland Kitchen Retinopathy     bilateral  . CKD (chronic kidney disease), stage IV   . HTN (hypertension)   . Abnormal renal ultrasound     a. 03/2014: Abnormal renal ultrasound with left renal lesion and bladder wall thickening and microscopic hematuria. Multiple cysts on prior MRI. Given inability to use contrast, f/u imaging limited, repeat US 3 months with outpatient uro f/u.  Marland Kitchen Paroxysmal atrial fibrillation   . Paroxysmal atrial flutter     Current Outpatient Prescriptions  Medication Sig Dispense Refill  . albuterol (PROVENTIL HFA;VENTOLIN HFA) 108 (90 BASE) MCG/ACT inhaler Inhale 2 puffs into the lungs every 6 (six) hours as needed for wheezing or shortness of breath.    Marland Kitchen amiodarone (PACERONE) 200 MG tablet Take 100 mg by mouth daily.    Marland Kitchen atorvastatin (LIPITOR) 40 MG tablet Take 1 tablet (40 mg total) by mouth daily. 30 tablet 0  . bumetanide (BUMEX) 1 MG tablet Take 1 mg by mouth 2 (two) times daily.    . carvedilol (COREG) 6.25 MG tablet Take 1 tablet (6.25 mg total) by mouth 2 (two) times daily with a meal. 90 tablet 3  . cholecalciferol (VITAMIN D) 1000 UNITS tablet Take 2,000 Units by mouth daily.    Marland Kitchen edoxaban (SAVAYSA) 30 MG TABS tablet Take 1 tablet (30 mg total) by mouth daily. 30 tablet 0  . ferrous sulfate 325 (65 FE) MG tablet Take 1 tablet (325 mg total) by mouth 2 (two) times daily with a meal. 90 tablet 3  . hydrALAZINE (APRESOLINE) 50 MG tablet Take 1.5 tablets (75 mg total) by mouth 3 (three) times daily. 135 tablet 3  . insulin aspart (NOVOLOG) 100 UNIT/ML injection Inject 10 Units into the skin 3 (three) times daily with meals. 10 mL 11  . insulin glargine (LANTUS) 100 UNIT/ML injection  Inject 0.25 mLs (25 Units total) into the skin at bedtime. 10 mL 11  . isosorbide mononitrate (IMDUR) 60 MG 24 hr tablet Take 60 mg by mouth daily.    . mometasone-formoterol (DULERA) 200-5 MCG/ACT AERO Inhale 2 puffs into the lungs 2 (two) times daily.    . Multiple Vitamin (MULTIVITAMIN WITH MINERALS) TABS tablet Take 1 tablet by mouth daily.    . potassium chloride (K-DUR) 10 MEQ tablet Take 2 tablets (20 mEq total) by mouth daily. 60 tablet 3   No current facility-administered medications for this encounter.     PHYSICAL EXAM: Filed Vitals:   11/24/14 0945  BP: 118/62  Pulse: 76  Weight: 189 lb 4 oz (85.843 kg)  SpO2: 93%    General:  Well appearing. No resp difficulty. Ambulated slowly in the clinic. Son present  HEENT: normal Neck: supple. JVP 6-7 cm. Carotids 2+ bilaterally; no bruits. No lymphadenopathy or thryomegaly appreciated. Cor: PMI  normal. Regular rate & rhythm. No rubs, gallops or murmurs. Lungs: clear Abdomen: soft, nontender, nondistended. No hepatosplenomegaly. No bruits or masses. Good bowel sounds. Extremities: no cyanosis, clubbing, rash.  No lower extremity edema.   Neuro: alert & orientedx3, cranial nerves grossly intact. Moves all 4 extremities w/o difficulty. Affect pleasant.  ASSESSMENT & PLAN: 1. Chronic combined HF: Ischemic cardiomyopathy. Has Biotronik ICD. ECHO 03/2014 EF 25-30%. TEE 9/15 with EF 25%, mildly dilated RV with moderate to severe systolic dysfunction.  NYHA III symptoms.   Volume status stable. - Continue current dose of bumetanide, hydralazine/imdur, and carvedilol. Will not uptirate meds given fatigue.   - No ACEI/Spironolactone due to CKD.  - Reinforced daily weights, low salt food choices, and daily weights.   - IVCD 126 msec on ECG, probably not wide enough QRS to benefit much from CRT upgrade.  2. PAF (07/26/14) S/P TEE no LAA thrombus and successful DC-CV:  Regular rhythm. Continue low dose amiodarone 100 mg daily.  - Check LFTs  and TSH today He understands he needs yearly eye exams.    Continue edoxaban 30 mg daily (renally dosed).  He has been > 1 year since last PCI so he is not longer on aspirin. .  3. CAD S/P PCI: Last intervention in 11/14. On beta blocker and statin. No aspirin as he is on edoxaban and > 1 year since last PCI. Had an episode of chest pain at rest. Check Myoview. If high risk will need to consider LHC.  4. CKD Stage IV: No ACEI or spironolactone due to CKD. Had follow up with Dr Florene Glen. Planning for fistula soon.  5. Asthma: PCP managing. Now on nebulizer.  6. Syncope: Resolved    Follow up in 4 weeks.   Artemis Koller NP-C  10:01 AM

## 2014-11-24 NOTE — Patient Instructions (Signed)
Lab today  Your physician has requested that you have a lexiscan myoview. For further information please visit [email or web address]. Please follow instruction sheet, as given.  Your physician recommends that you schedule a follow-up appointment in: 4 weeks

## 2014-11-29 ENCOUNTER — Ambulatory Visit (HOSPITAL_COMMUNITY): Payer: Medicare HMO | Attending: Internal Medicine | Admitting: Radiology

## 2014-11-29 DIAGNOSIS — E119 Type 2 diabetes mellitus without complications: Secondary | ICD-10-CM | POA: Diagnosis not present

## 2014-11-29 DIAGNOSIS — I10 Essential (primary) hypertension: Secondary | ICD-10-CM | POA: Diagnosis not present

## 2014-11-29 DIAGNOSIS — R079 Chest pain, unspecified: Secondary | ICD-10-CM | POA: Insufficient documentation

## 2014-11-29 DIAGNOSIS — Z794 Long term (current) use of insulin: Secondary | ICD-10-CM | POA: Insufficient documentation

## 2014-11-29 DIAGNOSIS — R0789 Other chest pain: Secondary | ICD-10-CM

## 2014-11-29 DIAGNOSIS — R0602 Shortness of breath: Secondary | ICD-10-CM

## 2014-11-29 DIAGNOSIS — R0609 Other forms of dyspnea: Secondary | ICD-10-CM | POA: Diagnosis not present

## 2014-11-29 MED ORDER — TECHNETIUM TC 99M SESTAMIBI GENERIC - CARDIOLITE
10.0000 | Freq: Once | INTRAVENOUS | Status: AC | PRN
  Administered 2014-11-29: 10 via INTRAVENOUS

## 2014-11-29 MED ORDER — REGADENOSON 0.4 MG/5ML IV SOLN
0.4000 mg | Freq: Once | INTRAVENOUS | Status: AC
  Administered 2014-11-29: 0.4 mg via INTRAVENOUS

## 2014-11-29 MED ORDER — TECHNETIUM TC 99M SESTAMIBI GENERIC - CARDIOLITE
30.0000 | Freq: Once | INTRAVENOUS | Status: AC | PRN
  Administered 2014-11-29: 30 via INTRAVENOUS

## 2014-11-29 NOTE — Progress Notes (Addendum)
[place], [place] [address] [phone]    Cardiology Nuclear Med Study  Tony Freeman is a 64 y.o. male     MRN : [id number]     DOB: 1951-04-12  Procedure Date: 11/29/2014  Nuclear Med Background Indication for Stress Test:  Evaluation for Ischemia and Stent Patency History: CAD/Stent, AICD, AFIB,Atrial Flutter, and 2014 Myocardial Perfusion Imaging-abnormal Lexiscan with moderate reversible perfusion abnormality  Cardiac Risk Factors: Hypertension and IDDM   Symptoms: Chest pain with/without exertion (last occurrence last week), DOE   Nuclear Pre-Procedure Caffeine/Decaff Intake:  None NPO After: 9:30   Lungs:  Minimal expiratory wheeze that cleared after Albuterol Inhaler 2 sprays prior to [employer]. O2 Sat: 95% on room air. IV 0.9% NS with Angio Cath:  22g  IV Site: R Antecubital  IV Started by:  Crissie Figures, RN  Chest Size (in):  46 Cup Size: n/a  Height: 5\' 6"  (1.676 m)  Weight:  190 lb (86.183 kg)  BMI:  Body mass index is 30.68 kg/(m^2). Tech Comments:  CBG @ 9:45 am= 76, patient feeling dizzy. Sprite and crackers given, CBG@ 10 am=94. Patient feeling better. CBG 160 at 11:50am prior to [place]. [name], RN.    Nuclear Med Study 1 or 2 day study: 1 day  Stress Test Type:  Carlton Adam  Reading MD: N/A  Order Authorizing Provider:  [name], MD, and Darrick Grinder, NP  Resting Radionuclide: Technetium 62m Sestamibi  Resting Radionuclide Dose: 11.0 mCi   Stress Radionuclide:  Technetium 56m Sestamibi  Stress Radionuclide Dose: 33.0 mCi           Stress Protocol Rest HR: 60 Stress HR: 64  Rest BP: 130/80 Stress BP: 124/76  Exercise Time (min): n/a METS: n/a   Predicted Max HR: 159 bpm % Max HR: 40.25 bpm Rate Pressure Product: 7936   Dose of Adenosine (mg):  n/a Dose of Lexiscan: 0.4 mg  Dose of Atropine (mg): n/a Dose of Dobutamine: n/a mcg/kg/min (at max HR)  Stress Test Technologist: [name], RN  Nuclear Technologist:  Earl Many, CNMT     Rest Procedure:  Myocardial perfusion imaging was performed at rest 45 minutes following the intravenous administration of Technetium 30m Sestamibi. Rest ECG: NSR with nonspecific T wave changes.   Stress Procedure:  The patient received IV Lexiscan 0.4 mg over 15-seconds.  Technetium 47m Sestamibi injected at 30-seconds.  The patient complained of SOB, and lightheadedness with Lexiscan. There was a drop in BP to 90/60 that improved after trendelenburg position.Quantitative spect images were obtained after a 45 minute delay. Stress ECG: No significant change from baseline ECG  QPS Raw Data Images:  Normal; no motion artifact; normal heart/lung ratio. Stress Images:  Small, mild mid anterior perfusion defect.  Small, mild basal inferior perfusion defect. Rest Images:  Small, mild mid anterior perfusion defect.  Small, mild basal inferior perfusion defect. Subtraction (SDS):  Fixed mid anterior and basal inferior perfusion defects. Transient Ischemic Dilatation (Normal <1.22):  1.02 Lung/Heart Ratio (Normal <0.45):  0.42  Quantitative Gated Spect Images QGS EDV:  161 ml QGS ESV:  111 ml  Impression Exercise Capacity:  Lexiscan with no exercise. BP Response:  Hypotensive blood pressure response. Clinical Symptoms:  Dyspnea, lightheaded.  ECG Impression:  No significant ST segment change suggestive of ischemia. Comparison with Prior Nuclear Study: No images to compare  Overall Impression:  High risk stress nuclear study with a small, mild mid  anterior fixed defect and small, mild basal inferior fixed defect.  There was no significant ischemia.  EF was 31% with global hypokinesis (low EF makes the study high risk).  The fixed defects represent either small areas of prior infarction or (more likely) soft tissue attenuation. .  LV Ejection Fraction: 31%.  LV Wall Motion:  Global hypokinesis.    [name] 11/29/2014   "high  risk" because of low EF.  No ischemia noted.  Suspect small fixed defects may be due to attenuation.  No cath. Please inform patient.   [name] 11/30/2014

## 2014-11-30 NOTE — Progress Notes (Signed)
Pt aware.

## 2014-12-21 ENCOUNTER — Telehealth: Payer: Self-pay | Admitting: Hematology

## 2014-12-21 NOTE — Telephone Encounter (Signed)
Pt confirmed appt on 01/16/15 at 10 w/Feng Dx:  Iron deficiency anemia  Referring Dr. Penelope Coop

## 2014-12-24 ENCOUNTER — Emergency Department (HOSPITAL_COMMUNITY)
Admission: EM | Admit: 2014-12-24 | Discharge: 2014-12-24 | Disposition: A | Discharge status: Home / Self Care | Payer: Medicare HMO | Source: Home / Self Care | Attending: Emergency Medicine | Admitting: Emergency Medicine

## 2014-12-24 ENCOUNTER — Encounter (HOSPITAL_COMMUNITY): Payer: Self-pay | Admitting: *Deleted

## 2014-12-24 DIAGNOSIS — L0291 Cutaneous abscess, unspecified: Secondary | ICD-10-CM

## 2014-12-24 MED ORDER — SULFAMETHOXAZOLE-TRIMETHOPRIM 800-160 MG PO TABS: 1.0000 | ORAL_TABLET | Freq: Two times a day (BID) | ORAL | Status: DC

## 2014-12-24 NOTE — ED Notes (Signed)
Bump on upper back onset 2 weeks ago.  States it drained Wed. or Thur.  Has applied warm compress x 1 today.  Has been letting warm water run over it,in the shower.  Pain radiates to R shoulder and under his arm.

## 2014-12-24 NOTE — ED Provider Notes (Signed)
CSN: [id number]     Arrival date & time 12/24/14  1646 History   First MD Initiated Contact with Patient 12/24/14 1737     Chief Complaint  Patient presents with  . Cyst   (Consider location/radiation/quality/duration/timing/severity/associated sxs/prior Treatment) HPI  He is a 64 year old man with multiple cardiac issues here with his ex-wife and son for evaluation of abscess. He states he first noticed a bump on his right upper back about 2 weeks ago. He states it drained on Wednesday and Thursday. It continues to be very tender. He will feel the pain radiating into his armpit as well. No fevers, nausea, vomiting. No dizziness, diaphoresis, chest pain, shortness of breath.  Past Medical History  Diagnosis Date  . Diabetes mellitus with nephropathy   . Systolic and diastolic CHF, chronic     a. Echo at [facility] 11/17/2013 Ef <25%, severe hypokinesis of LV, moderately dilated LA, grade IV/IV diastolic dysfunction with irreversible restrictive pattern of mitral inflow, severe pulm HTN (RVSP >48mmHg), 1-2+ MR  b. Echo at [facility] 04/02/14 EF 25-30%, moderate concentric LVH, diffuse hypokinesis, moderately dilated LA, no significant MR observed  . CAD (coronary artery disease)     a. PCI LAD/RCA in 2007  b. PCI LAD/RCA in 2009 at [facility]  c. PCI LAD 08/2013, CTO of RCA and nonobstructive disease in the LCx.  Marland Kitchen Hyperlipidemia   . Stroke   . Dementia   . Ischemic cardiomyopathy     a. s/p Biotronik ICD 06/2014.  Marland Kitchen Retinopathy     bilateral  . CKD (chronic kidney disease), stage IV   . HTN (hypertension)   . Abnormal renal ultrasound     a. 03/2014: Abnormal renal ultrasound with left renal lesion and bladder wall thickening and microscopic hematuria. Multiple cysts on prior MRI. Given inability to use contrast, f/u imaging limited, repeat US 3 months with outpatient uro f/u.  Marland Kitchen Paroxysmal atrial fibrillation   . Paroxysmal atrial flutter    Past Surgical History  Procedure Laterality Date  .  Coronary stent placement      Multivessel coronary intervention in 2007 and 2009. Ischemic cardiomyopathyPCI LAD/ RCA in 2007 by Dr. Dwyane Dee. PCI LAD/RCA in 2009 at [facility]; PCI LAD 08/2013. Presented with unstable angina pectoris.. Coronary angio demonstrated proximal and distal LAD lesions.23/20 PROMUS stent in proximal LAD// PTCA of distal LAD  . Cardiac defibrillator placement    . Spine surgery      anterior fusion  . Hernia repair    . Tee without cardioversion N/A 07/26/2014    Procedure: TRANSESOPHAGEAL ECHOCARDIOGRAM (TEE);  Surgeon: Larey Dresser, MD;  Location: [place];  Service: Cardiovascular;  Laterality: N/A;  . Cardioversion N/A 07/26/2014    Procedure: CARDIOVERSION;  Surgeon: Larey Dresser, MD;  Location: [place];  Service: Cardiovascular;  Laterality: N/A;  . Right heart catheterization N/A 07/25/2014    Procedure: RIGHT HEART CATH;  Surgeon: Jolaine Artist, MD;  Location: [facility] CATH LAB;  Service: Cardiovascular;  Laterality: N/A;  . Cardiac defibrillator placement  06/2014   Family History  Problem Relation Age of Onset  . Heart disease      brother  . Diabetes Mother   . Cancer Father     brother  . Kidney disease     History  Substance Use Topics  . Smoking status: Never Smoker   . Smokeless tobacco: Not on file  . Alcohol Use: No    Review of Systems As in history of present  illness Allergies  Review of patient's allergies indicates no known allergies.  Home Medications   Prior to Admission medications   Medication Sig Start Date End Date Taking? Authorizing Provider  albuterol (PROVENTIL HFA;VENTOLIN HFA) 108 (90 BASE) MCG/ACT inhaler Inhale 2 puffs into the lungs every 6 (six) hours as needed for wheezing or shortness of breath.    Historical Provider, MD  amiodarone (PACERONE) 200 MG tablet Take 100 mg by mouth daily.    Historical Provider, MD  atorvastatin (LIPITOR) 40 MG tablet Take 1 tablet (40 mg total) by mouth daily. 04/08/14    Geradine Girt, DO  bumetanide (BUMEX) 1 MG tablet Take 1 mg by mouth 2 (two) times daily.    Historical Provider, MD  carvedilol (COREG) 6.25 MG tablet Take 1 tablet (6.25 mg total) by mouth 2 (two) times daily with a meal. 07/31/14   Tasrif Ahmed, MD  cholecalciferol (VITAMIN D) 1000 UNITS tablet Take 2,000 Units by mouth daily.    Historical Provider, MD  edoxaban (SAVAYSA) 30 MG TABS tablet Take 1 tablet (30 mg total) by mouth daily. 07/31/14   Dellia Nims, MD  ferrous sulfate 325 (65 FE) MG tablet Take 1 tablet (325 mg total) by mouth 2 (two) times daily with a meal. 07/31/14   Tasrif Ahmed, MD  hydrALAZINE (APRESOLINE) 50 MG tablet Take 1.5 tablets (75 mg total) by mouth 3 (three) times daily. 09/28/14   Larey Dresser, MD  insulin aspart (NOVOLOG) 100 UNIT/ML injection Inject 10 Units into the skin 3 (three) times daily with meals. 07/31/14   Tasrif Ahmed, MD  insulin glargine (LANTUS) 100 UNIT/ML injection Inject 0.25 mLs (25 Units total) into the skin at bedtime. 07/31/14   Dellia Nims, MD  isosorbide mononitrate (IMDUR) 60 MG 24 hr tablet Take 60 mg by mouth daily. 06/23/14 06/23/15  Historical Provider, MD  mometasone-formoterol (DULERA) 200-5 MCG/ACT AERO Inhale 2 puffs into the lungs 2 (two) times daily.    Historical Provider, MD  Multiple Vitamin (MULTIVITAMIN WITH MINERALS) TABS tablet Take 1 tablet by mouth daily.    Historical Provider, MD  potassium chloride (K-DUR) 10 MEQ tablet Take 2 tablets (20 mEq total) by mouth daily. 09/28/14   Larey Dresser, MD  sulfamethoxazole-trimethoprim (BACTRIM DS,SEPTRA DS) 800-160 MG per tablet Take 1 tablet by mouth 2 (two) times daily. 12/24/14 12/31/14  Melony Overly, MD   BP 124/76 mmHg  Pulse 68  Temp(Src) 99.3 F (37.4 C) (Oral)  Resp 16  SpO2 96% Physical Exam  Constitutional: He is oriented to person, place, and time. He appears well-developed and well-nourished. No distress.  Cardiovascular: Normal rate.   Pulmonary/Chest: Effort normal.   Neurological: He is alert and oriented to person, place, and time.  Skin:  He has a small 0.5 cm tender nodule on his upper back just to the right of the midline. It has a central scab. No active drainage. No fluctuance. No overlying erythema.    ED Course  Procedures (including critical care time) Labs Review Labs Reviewed - No data to display  Imaging Review No results found.   MDM   1. Abscess    No fluctuance that I can drain today. Recommended continued warm compresses. Given degree of pain, will treat with a 7 day course of Bactrim. He will follow-up with his PCP in the middle of the week for recheck. If his pain changes or he develops fevers, he will be reevaluated sooner.    Melony Overly, MD  12/24/14 1827 

## 2014-12-24 NOTE — Discharge Instructions (Signed)
You have a small abscess on your back. Apply warm compresses 3-4 times a day. Take Bactrim 1 pill twice a day for the next week. Please follow-up with your regular doctor in 4-5 days for a recheck.

## 2014-12-24 NOTE — ED Notes (Signed)
Pt.'s son called [facility] [place] and said they were not open. He asked if it could be sent to [facility] on [address].  Discussed with Dr. Bridgett Larsson and she said she would change it to the other pharmacy.

## 2014-12-25 ENCOUNTER — Encounter (HOSPITAL_COMMUNITY): Payer: Self-pay

## 2014-12-25 ENCOUNTER — Ambulatory Visit (HOSPITAL_COMMUNITY)
Admission: RE | Admit: 2014-12-25 | Discharge: 2014-12-25 | Disposition: A | Discharge status: Home / Self Care | Payer: Medicare HMO | Source: Ambulatory Visit | Attending: Internal Medicine | Admitting: Internal Medicine

## 2014-12-25 VITALS — BP 116/62 | HR 63 | Wt 190.0 lb

## 2014-12-25 DIAGNOSIS — E1121 Type 2 diabetes mellitus with diabetic nephropathy: Secondary | ICD-10-CM | POA: Insufficient documentation

## 2014-12-25 DIAGNOSIS — Z794 Long term (current) use of insulin: Secondary | ICD-10-CM | POA: Diagnosis not present

## 2014-12-25 DIAGNOSIS — I480 Paroxysmal atrial fibrillation: Secondary | ICD-10-CM

## 2014-12-25 DIAGNOSIS — I2583 Coronary atherosclerosis due to lipid rich plaque: Secondary | ICD-10-CM

## 2014-12-25 DIAGNOSIS — J45909 Unspecified asthma, uncomplicated: Secondary | ICD-10-CM | POA: Diagnosis not present

## 2014-12-25 DIAGNOSIS — E785 Hyperlipidemia, unspecified: Secondary | ICD-10-CM | POA: Diagnosis not present

## 2014-12-25 DIAGNOSIS — F0390 Unspecified dementia without behavioral disturbance: Secondary | ICD-10-CM | POA: Diagnosis not present

## 2014-12-25 DIAGNOSIS — I2510 Atherosclerotic heart disease of native coronary artery without angina pectoris: Secondary | ICD-10-CM

## 2014-12-25 DIAGNOSIS — I5042 Chronic combined systolic (congestive) and diastolic (congestive) heart failure: Secondary | ICD-10-CM

## 2014-12-25 DIAGNOSIS — Z79899 Other long term (current) drug therapy: Secondary | ICD-10-CM | POA: Diagnosis not present

## 2014-12-25 DIAGNOSIS — N184 Chronic kidney disease, stage 4 (severe): Secondary | ICD-10-CM | POA: Diagnosis not present

## 2014-12-25 DIAGNOSIS — R55 Syncope and collapse: Secondary | ICD-10-CM | POA: Diagnosis not present

## 2014-12-25 DIAGNOSIS — I129 Hypertensive chronic kidney disease with stage 1 through stage 4 chronic kidney disease, or unspecified chronic kidney disease: Secondary | ICD-10-CM | POA: Insufficient documentation

## 2014-12-25 NOTE — Patient Instructions (Signed)
We will contact you in 2-3 months to schedule your next appointment.  

## 2014-12-25 NOTE — Progress Notes (Signed)
Patient ID: Tony Freeman, male   DOB: Aug 29, 1951, 64 y.o.   MRN: [id number] Primary Physician: Tanya Nones, MD  Primary Cardiologist: [facility] Oman - Dr. Kreg Shropshire. Deno Etienne (ICD)  Nephrologist:  Daughter [facility]) [name] by phone and I will keep her updated. [phone].  HPI: Mr. Freilich is a 64 year old man with history of atrial fibrillation now on edoxaban, CAD (s/p multiple stents) chronic combined CHF (echo 04/02/2014 with LV EF: 25-30%, diffuse hypokinesis), ischemic cardiomyopathy s/p dual chamber Biotronik ICD placement 06/29/2014, DM2, CKD stage IV, asthma, HLD, abnormal renal US 03/2014 (followed by urology).   He also has a h/o PCI LAD/RCA in 2007, PCI LAD/RCA in 2009 at [facility], abnormal lexiscan stress test 06/2013 moderate reversible perfusion abnormality in anterior, septal and apical region, then PCI to LAD 08/2013. [facility] records indicate that coronary angio at that time also showed CTO of RCA and nonobstructive disease in the LCx. Echo at [facility] at least back to 01/2013 showed EF 35-40%, then <25% in 10/4968 with diastolic dysfunction and elevated RVSP. During his 03/2014 admission at [facility], he was diagnosed with new onset atrial fib-vs-flutter. Echo showed false tendon in LV apex, moderate LVH, EF 25-30%, diffuse HK, mod LAE, trivial TR but no mention of pulm HTN. He also had one troponin that was 0.11 and the rest were negative, felt due to CKD and intracranial etiology with some encephalopathy. He was discharged on Coumadin (along with his Plavix that he was on from prior PCI/stroke). However, it's not totally clear what he did with the Coumadin as it was no longer on his list when he saw his primary cardiologist in the [facility] system at the end of June. The patient thinks his cardiologist took him off it but the note doesn't reflect that. (The patient does have some cognitive difficulties since his stroke and lives with his son who manages his medicine.) [facility]where  notes between June/July/August indicate he had been in NSR. He went on to have ICD placement on 9/3. Reading their notes, it appears they were not aware of his 03/2014 diagnosis of atrial fibrillation until it was picked up on his ICD interrogation in early September at which time he was placed on Edoxaban. Biotronic interrogation confirms AF since ICD implant 06/29/14.  Admitted 07/16/14 with increased dyspnea. Found to be A fib RVR. Had TEE/DC-CV. TEE showed EF 25%, RV mildly dilated and moderately to severely dysfunctional.  Successful cardioversion completed. He required IV lasix and short term milrinone. He transitioned to torsemide 40 mg twice a day. He was on edoxaban prior to admit by cardiologist at [facility] and remained on edoxaban. He was discharged on amiodarone 400 mg daily, carvedilol 6.25 mg twice a day, and hydralazine 50 mg tid/imdur 60 mg daily. Discharged weight was 204 pounds.   He returns for follow up. Had myoveiw earlier this month with no ischemia noted. EF 31%. No recommendations for cath.  Denies SOB/PND/Orthopnea/CP. Weight at home 184 pounds. Taking all medications.    ECHO 03/2014 EF 25-30%   Labs  07/21/14 TSH 0.413  10/15 K 3.5 => 4.3 Creatinine 3.86 => 3.96, Na 137   ROS: All systems negative except as listed in HPI, PMH and Problem List.  Past Medical History  Diagnosis Date  . Diabetes mellitus with nephropathy   . Systolic and diastolic CHF, chronic     a. Echo at [facility] 11/17/2013 Ef <25%, severe hypokinesis of LV, moderately dilated LA, grade IV/IV diastolic dysfunction with irreversible restrictive pattern of mitral inflow,  severe pulm HTN (RVSP >54mmHg), 1-2+ MR  b. Echo at [facility] 04/02/14 EF 25-30%, moderate concentric LVH, diffuse hypokinesis, moderately dilated LA, no significant MR observed  . CAD (coronary artery disease)     a. PCI LAD/RCA in 2007  b. PCI LAD/RCA in 2009 at [facility]  c. PCI LAD 08/2013, CTO of RCA and nonobstructive disease in the LCx.  Marland Kitchen  Hyperlipidemia   . Stroke   . Dementia   . Ischemic cardiomyopathy     a. s/p Biotronik ICD 06/2014.  Marland Kitchen Retinopathy     bilateral  . CKD (chronic kidney disease), stage IV   . HTN (hypertension)   . Abnormal renal ultrasound     a. 03/2014: Abnormal renal ultrasound with left renal lesion and bladder wall thickening and microscopic hematuria. Multiple cysts on prior MRI. Given inability to use contrast, f/u imaging limited, repeat US 3 months with outpatient uro f/u.  Marland Kitchen Paroxysmal atrial fibrillation   . Paroxysmal atrial flutter     Current Outpatient Prescriptions  Medication Sig Dispense Refill  . albuterol (PROVENTIL HFA;VENTOLIN HFA) 108 (90 BASE) MCG/ACT inhaler Inhale 2 puffs into the lungs every 6 (six) hours as needed for wheezing or shortness of breath.    Marland Kitchen amiodarone (PACERONE) 200 MG tablet Take 100 mg by mouth daily.    Marland Kitchen atorvastatin (LIPITOR) 40 MG tablet Take 1 tablet (40 mg total) by mouth daily. 30 tablet 0  . bumetanide (BUMEX) 1 MG tablet Take 1 mg by mouth 2 (two) times daily.    . carvedilol (COREG) 6.25 MG tablet Take 1 tablet (6.25 mg total) by mouth 2 (two) times daily with a meal. 90 tablet 3  . cholecalciferol (VITAMIN D) 1000 UNITS tablet Take 2,000 Units by mouth daily.    Marland Kitchen edoxaban (SAVAYSA) 30 MG TABS tablet Take 1 tablet (30 mg total) by mouth daily. 30 tablet 0  . ferrous sulfate 325 (65 FE) MG tablet Take 1 tablet (325 mg total) by mouth 2 (two) times daily with a meal. 90 tablet 3  . hydrALAZINE (APRESOLINE) 50 MG tablet Take 1.5 tablets (75 mg total) by mouth 3 (three) times daily. 135 tablet 3  . insulin aspart (NOVOLOG) 100 UNIT/ML injection Inject 10 Units into the skin 3 (three) times daily with meals. 10 mL 11  . insulin glargine (LANTUS) 100 UNIT/ML injection Inject 0.25 mLs (25 Units total) into the skin at bedtime. 10 mL 11  . isosorbide mononitrate (IMDUR) 60 MG 24 hr tablet Take 60 mg by mouth daily.    . mometasone-formoterol (DULERA) 200-5  MCG/ACT AERO Inhale 2 puffs into the lungs 2 (two) times daily.    . Multiple Vitamin (MULTIVITAMIN WITH MINERALS) TABS tablet Take 1 tablet by mouth daily.    . potassium chloride (K-DUR) 10 MEQ tablet Take 2 tablets (20 mEq total) by mouth daily. 60 tablet 3   No current facility-administered medications for this encounter.     PHYSICAL EXAM: Filed Vitals:   12/25/14 0955  BP: 116/62  Pulse: 63  Weight: 190 lb (86.183 kg)  SpO2: 96%    General:  Well appearing. No resp difficulty. Ambulated slowly in the clinic. Son present  HEENT: normal Neck: supple. JVP 5-6cm. Carotids 2+ bilaterally; no bruits. No lymphadenopathy or thryomegaly appreciated. Cor: PMI normal. Regular rate & rhythm. No rubs, gallops. 2/6 TR.  Lungs: clear Abdomen: soft, nontender, nondistended. No hepatosplenomegaly. No bruits or masses. Good bowel sounds. Extremities: no cyanosis, clubbing, rash.  No  lower extremity edema.   Neuro: alert & orientedx3, cranial nerves grossly intact. Moves all 4 extremities w/o difficulty. Affect pleasant.  ASSESSMENT & PLAN: 1. Chronic combined HF: Ischemic cardiomyopathy. Has Biotronik ICD. ECHO 03/2014 EF 25-30%. TEE 9/15 with EF 25%, mildly dilated RV with moderate to severe systolic dysfunction.  NYHA II-III symptoms.  Volume status stable.Continue bumex 1 mg twice a day.   Continue hydralazine 75 mg tid/imdur 60 mg daily, and carvedilol 6.25 mg twice a day. Will not uptitrate BB today as he has had fatigue.  - No ACEI/Spironolactone due to CKD.  - Reinforced daily weights, low salt food choices, and daily weights.   - 2. PAF (07/26/14) S/P TEE no LAA thrombus and successful DC-CV:  Regular rhythm. Continue low dose amiodarone 100 mg daily.  He understands he needs yearly eye exams.    Continue edoxaban 30 mg daily (renally dosed).  He has been > 1 year since last PCI so he is not longer on aspirin. .  3. CAD S/P PCI: Last intervention in 11/14. On beta blocker and statin. No  aspirin as he is on edoxaban and > 1 year since last PCI. Myoview completed with no evidence of ischemia. No recommendations for cath.  4. CKD Stage IV: No ACEI or spironolactone due to CKD. Had follow up with Dr Florene Glen. Planning for fistula soon.  5. Asthma: PCP managing. Now on nebulizer.  6. Syncope: Resolved   Follow up in 2-3 months.    CLEGG,AMY NP-C  9:54 AM    Patient seen and examined with Darrick Grinder, NP. We discussed all aspects of the encounter. I agree with the assessment and plan as stated above. He is doing very well. Volume status well controlled. Maintaining NSR on amiodarone. Continue edoxaban. No ischemic symptoms. Continue current regimen.  Cherese Lozano,MD 2:56 PM

## 2014-12-28 ENCOUNTER — Ambulatory Visit (HOSPITAL_COMMUNITY): Payer: Medicare HMO

## 2015-01-01 ENCOUNTER — Ambulatory Visit (HOSPITAL_COMMUNITY): Payer: Medicare HMO

## 2015-01-03 ENCOUNTER — Ambulatory Visit (HOSPITAL_COMMUNITY): Payer: Medicare HMO

## 2015-01-05 ENCOUNTER — Other Ambulatory Visit: Payer: Self-pay

## 2015-01-05 MED ORDER — POTASSIUM CHLORIDE ER 10 MEQ PO TBCR: 20.0000 meq | EXTENDED_RELEASE_TABLET | Freq: Every day | ORAL | Status: DC

## 2015-01-05 MED ORDER — HYDRALAZINE HCL 50 MG PO TABS: 75.0000 mg | ORAL_TABLET | Freq: Three times a day (TID) | ORAL | Status: DC

## 2015-01-08 ENCOUNTER — Ambulatory Visit (HOSPITAL_COMMUNITY): Payer: Medicare HMO

## 2015-01-10 ENCOUNTER — Other Ambulatory Visit: Payer: Self-pay

## 2015-01-10 ENCOUNTER — Ambulatory Visit (HOSPITAL_COMMUNITY): Payer: Medicare HMO

## 2015-01-10 MED ORDER — POTASSIUM CHLORIDE ER 10 MEQ PO TBCR: 20.0000 meq | EXTENDED_RELEASE_TABLET | Freq: Every day | ORAL | Status: DC

## 2015-01-10 MED ORDER — HYDRALAZINE HCL 50 MG PO TABS: 75.0000 mg | ORAL_TABLET | Freq: Three times a day (TID) | ORAL | Status: DC

## 2015-01-15 ENCOUNTER — Ambulatory Visit (HOSPITAL_COMMUNITY): Payer: Medicare HMO

## 2015-01-16 ENCOUNTER — Encounter: Payer: Self-pay | Admitting: Hematology

## 2015-01-16 ENCOUNTER — Ambulatory Visit (HOSPITAL_BASED_OUTPATIENT_CLINIC_OR_DEPARTMENT_OTHER): Payer: Medicare HMO

## 2015-01-16 ENCOUNTER — Ambulatory Visit (HOSPITAL_BASED_OUTPATIENT_CLINIC_OR_DEPARTMENT_OTHER): Payer: Medicare HMO | Admitting: Hematology

## 2015-01-16 ENCOUNTER — Telehealth: Payer: Self-pay | Admitting: Hematology

## 2015-01-16 ENCOUNTER — Ambulatory Visit: Payer: Medicare HMO

## 2015-01-16 VITALS — BP 126/60 | HR 60 | Temp 98.8°F | Resp 18 | Ht 66.0 in | Wt 185.9 lb

## 2015-01-16 DIAGNOSIS — E119 Type 2 diabetes mellitus without complications: Secondary | ICD-10-CM

## 2015-01-16 DIAGNOSIS — D649 Anemia, unspecified: Secondary | ICD-10-CM | POA: Diagnosis not present

## 2015-01-16 DIAGNOSIS — I10 Essential (primary) hypertension: Secondary | ICD-10-CM

## 2015-01-16 LAB — CBC & DIFF AND RETIC
BASO%: 0.4 % (ref 0.0–2.0)
Basophils Absolute: 0 10*3/uL (ref 0.0–0.1)
EOS%: 4.1 % (ref 0.0–7.0)
Eosinophils Absolute: 0.2 10*3/uL (ref 0.0–0.5)
HCT: 28 % — ABNORMAL LOW (ref 38.4–49.9)
HGB: 8.9 g/dL — ABNORMAL LOW (ref 13.0–17.1)
Immature Retic Fract: 4.9 % (ref 3.00–10.60)
LYMPH%: 13 % — AB (ref 14.0–49.0)
MCH: 29.8 pg (ref 27.2–33.4)
MCHC: 31.8 g/dL — AB (ref 32.0–36.0)
MCV: 93.6 fL (ref 79.3–98.0)
MONO#: 0.8 10*3/uL (ref 0.1–0.9)
MONO%: 14.9 % — ABNORMAL HIGH (ref 0.0–14.0)
NEUT#: 3.5 10*3/uL (ref 1.5–6.5)
NEUT%: 67.6 % (ref 39.0–75.0)
Platelets: 160 10*3/uL (ref 140–400)
RBC: 2.99 10*6/uL — ABNORMAL LOW (ref 4.20–5.82)
RDW: 15.4 % — ABNORMAL HIGH (ref 11.0–14.6)
Retic %: 2.55 % — ABNORMAL HIGH (ref 0.80–1.80)
Retic Ct Abs: 76.25 10*3/uL (ref 34.80–93.90)
WBC: 5.2 10*3/uL (ref 4.0–10.3)
lymph#: 0.7 10*3/uL — ABNORMAL LOW (ref 0.9–3.3)

## 2015-01-16 LAB — IRON AND TIBC CHCC
%SAT: 18 % — ABNORMAL LOW (ref 20–55)
Iron: 56 ug/dL (ref 42–163)
TIBC: 316 ug/dL (ref 202–409)
UIBC: 260 ug/dL (ref 117–376)

## 2015-01-16 LAB — MORPHOLOGY: PLT EST: ADEQUATE

## 2015-01-16 LAB — FERRITIN CHCC: Ferritin: 279 ng/ml (ref 22–316)

## 2015-01-16 LAB — TSH CHCC: TSH: 0.942 m[IU]/L (ref 0.320–4.118)

## 2015-01-16 LAB — LACTATE DEHYDROGENASE (CC13): LDH: 218 U/L (ref 125–245)

## 2015-01-16 LAB — DRAW EXTRA CLOT TUBE

## 2015-01-16 NOTE — Progress Notes (Signed)
[facility]  Telephone:(336) [phone] Fax:([phone]) [place] consult Note   Patient Care Team: Tanya Nones, MD as PCP - General (Family Medicine) 01/16/2015  CHIEF COMPLAINTS/PURPOSE OF CONSULTATION:  Anemia   HISTORY OF PRESENTING ILLNESS:  Tony Freeman 64 y.o. male with past medical history significant for hypertension, diabetes, CHF, CK take, and stroke, is here because of anemia.  He had stroke 6 years ago with residual right hand weakness and mild demetia. He is on disbility for that.   He was found to have abnormal CBC from  6 month ago (or longer), his CBC from 11/27/2014 showed hemoglobin 8.4, hematocrit 27.1, MCV 87.4, RDW 17.2. Pre-count and WBC 1 normal. His CMP showed total protein 6.8, calcium 10.6, alkaline phosphatase 226, BUN 39, creatinine 2.95, glucose 233, the rest were all within normal limits.   he complains fatigue for the past year.  He denies recent chest pain on exertion, (+) shortness of breath on mild exertion, no pre-syncopal episodes, or palpitations. He had not noticed any recent bleeding such as epistaxis, hematuria or hematochezia The patient denies over the counter NSAID ingestion. He is not on antiplatelets agents. His last EGD and colonoscopy was normal in one month ago.   He had no prior history or diagnosis of cancer. His age appropriate screening programs are up-to-date. He denies any pica and eats a variety of diet. He never donated blood or received blood transfusion The patient was prescribed oral iron supplements and he takes 2 tab daily for the past 4-6 month.   No fever, chills or night sweats, he feels cold most of time. He lost about 40 lbs in the past one year, but stable in the past 3-4 month. He has low appetite, no nausea or constipation   MEDICAL HISTORY:  Past Medical History  Diagnosis Date  . Diabetes mellitus with nephropathy   . Systolic and diastolic CHF, chronic     a. Echo at [facility]  11/17/2013 Ef <25%, severe hypokinesis of LV, moderately dilated LA, grade IV/IV diastolic dysfunction with irreversible restrictive pattern of mitral inflow, severe pulm HTN (RVSP >19mHg), 1-2+ MR  [facility] EF 25-30%, moderate concentric LVH, diffuse hypokinesis, moderately dilated LA, no significant MR observed  . CAD (coronary artery disease)     a. PCI LAD/RCA in 2007  [facility]. PCI LAD 08/2013, CTO of RCA and nonobstructive disease in the LCx.  .Marland KitchenHyperlipidemia   . Stroke   . Dementia   . Ischemic cardiomyopathy     a. s/p Biotronik ICD 06/2014.  .Marland KitchenRetinopathy     bilateral  . CKD (chronic kidney disease), stage IV   . HTN (hypertension)   . Abnormal renal ultrasound     a. 03/2014: Abnormal renal ultrasound with left renal lesion and bladder wall thickening and microscopic hematuria. Multiple cysts on prior MRI. Given inability to use contrast, f/u imaging limited, repeat [place] with outpatient uro f/u.  .Marland KitchenParoxysmal atrial fibrillation   . Paroxysmal atrial flutter     SURGICAL HISTORY: Past Surgical History  Procedure Laterality Date  . Coronary stent placement      Multivessel coronary intervention in 2007 and 2009. Ischemic cardiomyopathyPCI LAD/ RCA in 2007 by Dr. KDwyane Dee [facility]. Presented with unstable angina pectoris.. Coronary angio demonstrated proximal and distal LAD lesions.760/20PROMUS stent in proximal LAD// PTCA of  distal LAD  . Cardiac defibrillator placement    . Spine surgery      anterior fusion  . Hernia repair    . Tee without cardioversion N/A 07/26/2014    Procedure: TRANSESOPHAGEAL ECHOCARDIOGRAM (TEE);  Surgeon: Larey Dresser, MD;  Location: [facility];  Service: Cardiovascular;  Laterality: N/A;  . Cardioversion N/A 07/26/2014    Procedure: CARDIOVERSION;  Surgeon: Larey Dresser, MD;  Location: [place];  Service: Cardiovascular;  Laterality: N/A;    . Right heart catheterization N/A 07/25/2014    Procedure: RIGHT HEART CATH;  Surgeon: Jolaine Artist, MD;  Location: [facility] CATH LAB;  Service: Cardiovascular;  Laterality: N/A;  . Cardiac defibrillator placement  06/2014    SOCIAL HISTORY: History   Social History  . Marital Status: Unknown    Spouse Name: N/A  . Number of Children: N/A  . Years of Education: N/A   Occupational History  . Not on file.   Social History Main Topics  . Smoking status: Never Smoker   . Smokeless tobacco: Not on file  . Alcohol Use: No  . Drug Use: No  . Sexual Activity: Not on file   Other Topics Concern  . Not on file   Social History Narrative   Going to be living with his son.  He lives with his daughter currently.  2 children.  Disability since CVA    FAMILY HISTORY: Family History  Problem Relation Age of Onset  . Heart disease      brother  . Diabetes Mother   . Cancer Father     brother  . Kidney disease      ALLERGIES:  has No Known Allergies.  MEDICATIONS:  Current Outpatient Prescriptions  Medication Sig Dispense Refill  . albuterol (PROVENTIL HFA;VENTOLIN HFA) 108 (90 BASE) MCG/ACT inhaler Inhale 2 puffs into the lungs every 6 (six) hours as needed for wheezing or shortness of breath.    Marland Kitchen amiodarone (PACERONE) 200 MG tablet Take 100 mg by mouth daily.    Marland Kitchen atorvastatin (LIPITOR) 40 MG tablet Take 1 tablet (40 mg total) by mouth daily. 30 tablet 0  . bumetanide (BUMEX) 1 MG tablet Take 1 mg by mouth 2 (two) times daily.    . carvedilol (COREG) 6.25 MG tablet Take 1 tablet (6.25 mg total) by mouth 2 (two) times daily with a meal. 90 tablet 3  . cholecalciferol (VITAMIN D) 1000 UNITS tablet Take 2,000 Units by mouth daily.    Marland Kitchen edoxaban (SAVAYSA) 30 MG TABS tablet Take 1 tablet (30 mg total) by mouth daily. 30 tablet 0  . ferrous sulfate 325 (65 FE) MG tablet Take 1 tablet (325 mg total) by mouth 2 (two) times daily with a meal. 90 tablet 3  . hydrALAZINE (APRESOLINE) 50  MG tablet Take 1.5 tablets (75 mg total) by mouth 3 (three) times daily. 405 tablet 1  . insulin aspart (NOVOLOG) 100 UNIT/ML injection Inject 10 Units into the skin 3 (three) times daily with meals. 10 mL 11  . insulin glargine (LANTUS) 100 UNIT/ML injection Inject 0.25 mLs (25 Units total) into the skin at bedtime. 10 mL 11  . isosorbide mononitrate (IMDUR) 60 MG 24 hr tablet Take 60 mg by mouth daily.    . mirtazapine (REMERON) 7.5 MG tablet Take 7.5 mg by mouth.    . mometasone-formoterol (DULERA) 200-5 MCG/ACT AERO Inhale 2 puffs into the lungs 2 (two) times daily.    . potassium chloride (K-DUR) 10 MEQ tablet  Take 2 tablets (20 mEq total) by mouth daily. 180 tablet 1  . Multiple Vitamin (MULTIVITAMIN WITH MINERALS) TABS tablet Take 1 tablet by mouth daily.     No current facility-administered medications for this visit.    REVIEW OF SYSTEMS:   Constitutional: Denies fevers, chills or abnormal night sweats Eyes: Denies blurriness of vision, double vision or watery eyes Ears, nose, mouth, throat, and face: Denies mucositis or sore throat Respiratory: Denies cough, dyspnea or wheezes Cardiovascular: Denies palpitation, chest discomfort or lower extremity swelling Gastrointestinal:  Denies nausea, heartburn or change in bowel habits Skin: Denies abnormal skin rashes Lymphatics: Denies new lymphadenopathy or easy bruising Neurological:Denies numbness, tingling or new weaknesses Behavioral/Psych: Mood is stable, no new changes  All other systems were reviewed with the patient and are negative.  PHYSICAL EXAMINATION: ECOG PERFORMANCE STATUS: 1 - Symptomatic but completely ambulatory  Filed Vitals:   01/16/15 1120  BP: 126/60  Pulse: 60  Temp: 98.8 F (37.1 C)  Resp: 18   Filed Weights   01/16/15 1120  Weight: 185 lb 14.4 oz (84.324 kg)    GENERAL:alert, no distress and comfortable SKIN: skin color, texture, turgor are normal, no rashes or significant lesions EYES: normal,  conjunctiva are pink and non-injected, sclera clear OROPHARYNX:no exudate, no erythema and lips, buccal mucosa, and tongue normal  NECK: supple, thyroid normal size, non-tender, without nodularity LYMPH:  no palpable lymphadenopathy in the cervical, axillary or inguinal LUNGS: clear to auscultation and percussion with normal breathing effort HEART: regular rate & rhythm and no murmurs and no lower extremity edema ABDOMEN:abdomen soft, non-tender and normal bowel sounds Musculoskeletal:no cyanosis of digits and no clubbing  PSYCH: alert & oriented x 3 with fluent speech NEURO: no focal motor/sensory deficits  LABORATORY DATA:  I have reviewed the data as listed CBC Latest Ref Rng 07/26/2014 07/25/2014 07/24/2014  WBC 4.0 - 10.5 K/uL 9.7 10.7(H) 10.6(H)  Hemoglobin 13.0 - 17.0 g/dL 9.8(L) 10.2(L) 9.1(L)  Hematocrit 39.0 - 52.0 % 30.4(L) 31.6(L) 27.9(L)  Platelets 150 - 400 K/uL 216 220 247    CMP Latest Ref Rng 11/24/2014 08/07/2014 07/31/2014  Glucose 70 - 99 mg/dL - 217(H) 133(H)  BUN 6 - 23 mg/dL - 79(H) 67(H)  Creatinine 0.50 - 1.35 mg/dL - 3.96(H) 3.86(H)  Sodium 137 - 147 mEq/L - 137 137  Potassium 3.7 - 5.3 mEq/L - 4.3 3.5(L)  Chloride 96 - 112 mEq/L - 95(L) 93(L)  CO2 19 - 32 mEq/L - 28 33(H)  Calcium 8.4 - 10.5 mg/dL - 9.8 9.3  Total Protein 6.0 - 8.3 g/dL 7.1 - -  Total Bilirubin 0.3 - 1.2 mg/dL 1.8(H) - -  Alkaline Phos 39 - 117 U/L 187(H) - -  AST 0 - 37 U/L 34 - -  ALT 0 - 53 U/L 22 - -     RADIOGRAPHIC STUDIES: I have personally reviewed the radiological images as listed and agreed with the findings in the report. No results found.  ASSESSMENT & PLAN:  64 year old male with multiple comorbidities, including hypertension, diabetes, CHF, stroke, dementia, stage IV CKD, here for evaluation of his anemia.  1. Normocytic hypo-productive anemia -He had a normal CBC in June 2015, developed worsening anemia since then. His CBC today showed a normal MCV, low reticulocyte  count at 76,  This is consistent with normocytic hypo-productive anemia -His iron study showed ferritin 279, normal serum iron and TIBC, normal folic acid and V25 level, this is not consistent with nutritional anemia -I'll also  check his TSH, testosterone level, erythropoietin level, SPEP/UPEP with immunofixation to ruled out multiple myeloma -he has stage IV chronic kidney disease, this certainly contributes to his anemia. However he has long-standing history of CKD, and his anemia is relatively new for the past 9 months -Giving the moderate anemia, relatively short history, bone marrow disease such as MDS is possible. I recommend to have a bone marrow biopsy to ruled out. The benefit and risks were explained to the patient and he agrees to proceed.  2. Hypertension, diabetes, CHF, COPD,  history of stroke -He will continue follow-up with his primary care physician and other specialties.  Plan -Lab today -Bone marrow aspiration and biopsy by IR -Return to clinic in 4 weeks to review the above test results.   All q with any problems, questions or concerns. I spent 45 minutes counseling the patient face to face. The total time spent in the appointment was 55 minutes and more than 50% was on counseling.     Truitt Merle, MD 01/16/2015 11:47 AM

## 2015-01-16 NOTE — Telephone Encounter (Signed)
Gave avs & calendar for April. Sent patient to labs

## 2015-01-16 NOTE — Progress Notes (Signed)
Checked in new pt with no financial concerns. °

## 2015-01-17 ENCOUNTER — Telehealth: Payer: Self-pay | Admitting: *Deleted

## 2015-01-17 ENCOUNTER — Ambulatory Visit (HOSPITAL_COMMUNITY): Payer: Medicare HMO

## 2015-01-17 NOTE — Telephone Encounter (Signed)
01-16-2015 results requested.  Only preliminary results noted at this time.  Reviewed how to set up MyChart patient portal to access results at anytime after released to patient portal.

## 2015-01-19 LAB — TESTOSTERONE: Testosterone: 517 ng/dL (ref 300–890)

## 2015-01-19 LAB — VITAMIN B12: VITAMIN B 12: 930 pg/mL — AB (ref 211–911)

## 2015-01-19 LAB — SPEP & IFE WITH QIG
Albumin ELP: 3.7 g/dL — ABNORMAL LOW (ref 3.8–4.8)
Alpha-1-Globulin: 0.4 g/dL — ABNORMAL HIGH (ref 0.2–0.3)
Alpha-2-Globulin: 0.7 g/dL (ref 0.5–0.9)
BETA 2: 0.4 g/dL (ref 0.2–0.5)
BETA GLOBULIN: 0.4 g/dL (ref 0.4–0.6)
GAMMA GLOBULIN: 1.5 g/dL (ref 0.8–1.7)
IGA: 310 mg/dL (ref 68–379)
IgG (Immunoglobin G), Serum: 1650 mg/dL — ABNORMAL HIGH (ref 650–1600)
IgM, Serum: 91 mg/dL (ref 41–251)
Total Protein, Serum Electrophoresis: 7.1 g/dL (ref 6.1–8.1)

## 2015-01-19 LAB — HEAVY METALS, BLOOD
Arsenic: <3 mcg/L (ref <23)
Lead: <2 ug/dL (ref <10)
Mercury, B: <4 mcg/L (ref <=10)

## 2015-01-19 LAB — KAPPA/LAMBDA LIGHT CHAINS
KAPPA FREE LGHT CHN: 10.5 mg/dL — AB (ref 0.33–1.94)
KAPPA LAMBDA RATIO: 1.96 — AB (ref 0.26–1.65)
LAMBDA FREE LGHT CHN: 5.35 mg/dL — AB (ref 0.57–2.63)

## 2015-01-19 LAB — ERYTHROPOIETIN: Erythropoietin: 34.3 m[IU]/mL — ABNORMAL HIGH (ref 2.6–18.5)

## 2015-01-19 LAB — FOLATE RBC: RBC FOLATE: 1420 ng/mL (ref >280)

## 2015-01-19 LAB — METHYLMALONIC ACID, SERUM: METHYLMALONIC ACID, QUANT: 199 nmol/L (ref 87–318)

## 2015-01-19 NOTE — Telephone Encounter (Signed)
[name] to get the results of the extra labs done on 01-16-15 with visit.  Told him that the results are not back as of today 01-19-15.  Told him that some of these tests done take ~ 1 week to be completed.  Suggested he call back on 01-23-15 if he has not heard from Dr. Burr Medico by that time.  Pt. Verbalized understanding.

## 2015-01-20 ENCOUNTER — Encounter: Payer: Self-pay | Admitting: Hematology

## 2015-01-22 ENCOUNTER — Encounter: Payer: Self-pay | Admitting: Hematology

## 2015-01-22 ENCOUNTER — Ambulatory Visit (HOSPITAL_COMMUNITY): Payer: Medicare HMO

## 2015-01-24 ENCOUNTER — Emergency Department (HOSPITAL_COMMUNITY): Payer: Medicare HMO

## 2015-01-24 ENCOUNTER — Other Ambulatory Visit: Payer: Self-pay | Admitting: *Deleted

## 2015-01-24 ENCOUNTER — Inpatient Hospital Stay (HOSPITAL_COMMUNITY)
Admission: EM | Admit: 2015-01-24 | Discharge: 2015-01-26 | DRG: 202 | Disposition: A | Discharge status: Home / Self Care | Payer: Medicare HMO | Attending: Internal Medicine | Admitting: Internal Medicine

## 2015-01-24 ENCOUNTER — Ambulatory Visit (HOSPITAL_COMMUNITY): Payer: Medicare HMO

## 2015-01-24 ENCOUNTER — Encounter (HOSPITAL_COMMUNITY): Payer: Self-pay | Admitting: *Deleted

## 2015-01-24 DIAGNOSIS — I120 Hypertensive chronic kidney disease with stage 5 chronic kidney disease or end stage renal disease: Secondary | ICD-10-CM | POA: Diagnosis present

## 2015-01-24 DIAGNOSIS — R0602 Shortness of breath: Secondary | ICD-10-CM | POA: Diagnosis not present

## 2015-01-24 DIAGNOSIS — H3500 Unspecified background retinopathy: Secondary | ICD-10-CM | POA: Diagnosis present

## 2015-01-24 DIAGNOSIS — IMO0002 Reserved for concepts with insufficient information to code with codable children: Secondary | ICD-10-CM | POA: Diagnosis present

## 2015-01-24 DIAGNOSIS — E1165 Type 2 diabetes mellitus with hyperglycemia: Secondary | ICD-10-CM | POA: Diagnosis present

## 2015-01-24 DIAGNOSIS — Z955 Presence of coronary angioplasty implant and graft: Secondary | ICD-10-CM

## 2015-01-24 DIAGNOSIS — J209 Acute bronchitis, unspecified: Secondary | ICD-10-CM | POA: Diagnosis present

## 2015-01-24 DIAGNOSIS — N184 Chronic kidney disease, stage 4 (severe): Secondary | ICD-10-CM | POA: Diagnosis not present

## 2015-01-24 DIAGNOSIS — Z8673 Personal history of transient ischemic attack (TIA), and cerebral infarction without residual deficits: Secondary | ICD-10-CM

## 2015-01-24 DIAGNOSIS — E1129 Type 2 diabetes mellitus with other diabetic kidney complication: Secondary | ICD-10-CM | POA: Diagnosis not present

## 2015-01-24 DIAGNOSIS — E1121 Type 2 diabetes mellitus with diabetic nephropathy: Secondary | ICD-10-CM | POA: Diagnosis present

## 2015-01-24 DIAGNOSIS — I5042 Chronic combined systolic (congestive) and diastolic (congestive) heart failure: Secondary | ICD-10-CM | POA: Diagnosis present

## 2015-01-24 DIAGNOSIS — N185 Chronic kidney disease, stage 5: Secondary | ICD-10-CM | POA: Diagnosis present

## 2015-01-24 DIAGNOSIS — I2510 Atherosclerotic heart disease of native coronary artery without angina pectoris: Secondary | ICD-10-CM | POA: Diagnosis present

## 2015-01-24 DIAGNOSIS — Z794 Long term (current) use of insulin: Secondary | ICD-10-CM | POA: Diagnosis not present

## 2015-01-24 DIAGNOSIS — J45909 Unspecified asthma, uncomplicated: Secondary | ICD-10-CM | POA: Diagnosis present

## 2015-01-24 DIAGNOSIS — I10 Essential (primary) hypertension: Secondary | ICD-10-CM | POA: Diagnosis not present

## 2015-01-24 DIAGNOSIS — N179 Acute kidney failure, unspecified: Secondary | ICD-10-CM | POA: Diagnosis present

## 2015-01-24 DIAGNOSIS — I480 Paroxysmal atrial fibrillation: Secondary | ICD-10-CM | POA: Diagnosis present

## 2015-01-24 DIAGNOSIS — E1122 Type 2 diabetes mellitus with diabetic chronic kidney disease: Secondary | ICD-10-CM | POA: Diagnosis present

## 2015-01-24 DIAGNOSIS — E785 Hyperlipidemia, unspecified: Secondary | ICD-10-CM | POA: Diagnosis present

## 2015-01-24 DIAGNOSIS — F0390 Unspecified dementia without behavioral disturbance: Secondary | ICD-10-CM | POA: Diagnosis present

## 2015-01-24 DIAGNOSIS — I255 Ischemic cardiomyopathy: Secondary | ICD-10-CM | POA: Diagnosis present

## 2015-01-24 DIAGNOSIS — E860 Dehydration: Secondary | ICD-10-CM | POA: Diagnosis present

## 2015-01-24 DIAGNOSIS — R531 Weakness: Secondary | ICD-10-CM

## 2015-01-24 DIAGNOSIS — I272 Other secondary pulmonary hypertension: Secondary | ICD-10-CM | POA: Diagnosis present

## 2015-01-24 DIAGNOSIS — Z95810 Presence of automatic (implantable) cardiac defibrillator: Secondary | ICD-10-CM

## 2015-01-24 LAB — COMPREHENSIVE METABOLIC PANEL
ALK PHOS: 189 U/L — AB (ref 39–117)
ALT: 24 U/L (ref 0–53)
ANION GAP: 7 (ref 5–15)
AST: 36 U/L (ref 0–37)
Albumin: 3.6 g/dL (ref 3.5–5.2)
BUN: 82 mg/dL — ABNORMAL HIGH (ref 6–23)
CHLORIDE: 100 mmol/L (ref 96–112)
CO2: 29 mmol/L (ref 19–32)
Calcium: 11.4 mg/dL — ABNORMAL HIGH (ref 8.4–10.5)
Creatinine, Ser: 5.78 mg/dL — ABNORMAL HIGH (ref 0.50–1.35)
GFR calc Af Amer: 11 mL/min — ABNORMAL LOW (ref >90)
GFR calc non Af Amer: 9 mL/min — ABNORMAL LOW (ref >90)
Glucose, Bld: 199 mg/dL — ABNORMAL HIGH (ref 70–99)
Potassium: 3.8 mmol/L (ref 3.5–5.1)
Sodium: 136 mmol/L (ref 135–145)
Total Bilirubin: 1.5 mg/dL — ABNORMAL HIGH (ref 0.3–1.2)
Total Protein: 7.6 g/dL (ref 6.0–8.3)

## 2015-01-24 LAB — CBC WITH DIFFERENTIAL/PLATELET
BASOS PCT: 0 % (ref 0–1)
Basophils Absolute: 0 10*3/uL (ref 0.0–0.1)
EOS ABS: 0.1 10*3/uL (ref 0.0–0.7)
EOS PCT: 3 % (ref 0–5)
HCT: 28.2 % — ABNORMAL LOW (ref 39.0–52.0)
Hemoglobin: 8.9 g/dL — ABNORMAL LOW (ref 13.0–17.0)
LYMPHS ABS: 0.8 10*3/uL (ref 0.7–4.0)
Lymphocytes Relative: 19 % (ref 12–46)
MCH: 29.7 pg (ref 26.0–34.0)
MCHC: 31.6 g/dL (ref 30.0–36.0)
MCV: 94 fL (ref 78.0–100.0)
MONO ABS: 0.4 10*3/uL (ref 0.1–1.0)
Monocytes Relative: 10 % (ref 3–12)
Neutro Abs: 2.6 10*3/uL (ref 1.7–7.7)
Neutrophils Relative %: 68 % (ref 43–77)
Platelets: 140 10*3/uL — ABNORMAL LOW (ref 150–400)
RBC: 3 MIL/uL — AB (ref 4.22–5.81)
RDW: 15.2 % (ref 11.5–15.5)
WBC: 3.9 10*3/uL — AB (ref 4.0–10.5)

## 2015-01-24 LAB — PROTIME-INR
INR: 2.57 — ABNORMAL HIGH (ref 0.00–1.49)
Prothrombin Time: 27.8 seconds — ABNORMAL HIGH (ref 11.6–15.2)

## 2015-01-24 LAB — TSH: TSH: 1.075 u[IU]/mL (ref 0.350–4.500)

## 2015-01-24 MED ORDER — ALBUTEROL SULFATE (2.5 MG/3ML) 0.083% IN NEBU
5.0000 mg | INHALATION_SOLUTION | Freq: Once | RESPIRATORY_TRACT | Status: AC
  Administered 2015-01-24: 5 mg via RESPIRATORY_TRACT
  Filled 2015-01-24: qty 6

## 2015-01-24 MED ORDER — ISOSORBIDE MONONITRATE ER 60 MG PO TB24
60.0000 mg | ORAL_TABLET | Freq: Every day | ORAL | Status: DC
  Administered 2015-01-25 – 2015-01-26 (×2): 60 mg via ORAL
  Filled 2015-01-24 (×2): qty 1

## 2015-01-24 MED ORDER — ADULT MULTIVITAMIN W/MINERALS CH
1.0000 | ORAL_TABLET | Freq: Every day | ORAL | Status: DC
  Administered 2015-01-25 – 2015-01-26 (×2): 1 via ORAL
  Filled 2015-01-24 (×2): qty 1

## 2015-01-24 MED ORDER — HEPARIN SODIUM (PORCINE) 5000 UNIT/ML IJ SOLN: 5000.0000 [IU] | Freq: Three times a day (TID) | INTRAMUSCULAR | Status: DC

## 2015-01-24 MED ORDER — CETYLPYRIDINIUM CHLORIDE 0.05 % MT LIQD
7.0000 mL | Freq: Two times a day (BID) | OROMUCOSAL | Status: DC
  Administered 2015-01-24 – 2015-01-25 (×3): 7 mL via OROMUCOSAL

## 2015-01-24 MED ORDER — ALBUTEROL SULFATE (2.5 MG/3ML) 0.083% IN NEBU
2.5000 mg | INHALATION_SOLUTION | Freq: Once | RESPIRATORY_TRACT | Status: AC
  Administered 2015-01-24: 2.5 mg via RESPIRATORY_TRACT
  Filled 2015-01-24: qty 3

## 2015-01-24 MED ORDER — POTASSIUM CHLORIDE ER 10 MEQ PO TBCR
20.0000 meq | EXTENDED_RELEASE_TABLET | Freq: Every day | ORAL | Status: DC
  Administered 2015-01-25 – 2015-01-26 (×2): 20 meq via ORAL
  Filled 2015-01-24 (×2): qty 2

## 2015-01-24 MED ORDER — MOMETASONE FURO-FORMOTEROL FUM 200-5 MCG/ACT IN AERO
2.0000 | INHALATION_SPRAY | Freq: Two times a day (BID) | RESPIRATORY_TRACT | Status: DC
  Administered 2015-01-25 – 2015-01-26 (×3): 2 via RESPIRATORY_TRACT
  Filled 2015-01-24: qty 8.8

## 2015-01-24 MED ORDER — INSULIN GLARGINE 100 UNIT/ML ~~LOC~~ SOLN
25.0000 [IU] | Freq: Every day | SUBCUTANEOUS | Status: DC
  Administered 2015-01-25: 25 [IU] via SUBCUTANEOUS
  Filled 2015-01-24 (×3): qty 0.25

## 2015-01-24 MED ORDER — ALBUTEROL SULFATE (2.5 MG/3ML) 0.083% IN NEBU: 2.5000 mg | INHALATION_SOLUTION | Freq: Four times a day (QID) | RESPIRATORY_TRACT | Status: DC | PRN

## 2015-01-24 MED ORDER — ACETAMINOPHEN 325 MG PO TABS
650.0000 mg | ORAL_TABLET | Freq: Four times a day (QID) | ORAL | Status: DC | PRN
  Administered 2015-01-25: 650 mg via ORAL
  Filled 2015-01-24: qty 2

## 2015-01-24 MED ORDER — EDOXABAN TOSYLATE 30 MG PO TABS
30.0000 mg | ORAL_TABLET | Freq: Every day | ORAL | Status: DC
  Administered 2015-01-25 – 2015-01-26 (×2): 30 mg via ORAL
  Filled 2015-01-24 (×2): qty 1

## 2015-01-24 MED ORDER — VITAMIN D3 25 MCG (1000 UNIT) PO TABS
2000.0000 [IU] | ORAL_TABLET | Freq: Every day | ORAL | Status: DC
  Administered 2015-01-25 – 2015-01-26 (×2): 2000 [IU] via ORAL
  Filled 2015-01-24 (×2): qty 2

## 2015-01-24 MED ORDER — GUAIFENESIN-DM 100-10 MG/5ML PO SYRP
5.0000 mL | ORAL_SOLUTION | ORAL | Status: DC | PRN
  Filled 2015-01-24: qty 5

## 2015-01-24 MED ORDER — MIRTAZAPINE 7.5 MG PO TABS
7.5000 mg | ORAL_TABLET | Freq: Every day | ORAL | Status: DC
  Administered 2015-01-25: 7.5 mg via ORAL
  Filled 2015-01-24 (×2): qty 1

## 2015-01-24 MED ORDER — ATORVASTATIN CALCIUM 40 MG PO TABS
40.0000 mg | ORAL_TABLET | Freq: Every day | ORAL | Status: DC
  Administered 2015-01-25 – 2015-01-26 (×2): 40 mg via ORAL
  Filled 2015-01-24 (×2): qty 1

## 2015-01-24 MED ORDER — ALBUTEROL SULFATE HFA 108 (90 BASE) MCG/ACT IN AERS: 2.0000 | INHALATION_SPRAY | Freq: Four times a day (QID) | RESPIRATORY_TRACT | Status: DC | PRN

## 2015-01-24 MED ORDER — DEXTROSE 5 % IV SOLN
1.0000 g | INTRAVENOUS | Status: DC
  Administered 2015-01-24 – 2015-01-25 (×2): 1 g via INTRAVENOUS
  Filled 2015-01-24 (×3): qty 10

## 2015-01-24 MED ORDER — INSULIN ASPART 100 UNIT/ML ~~LOC~~ SOLN
10.0000 [IU] | Freq: Three times a day (TID) | SUBCUTANEOUS | Status: DC
  Administered 2015-01-24 – 2015-01-25 (×4): 10 [IU] via SUBCUTANEOUS

## 2015-01-24 MED ORDER — HYDROCODONE-ACETAMINOPHEN 5-325 MG PO TABS: 1.0000 | ORAL_TABLET | ORAL | Status: DC | PRN

## 2015-01-24 MED ORDER — FERROUS SULFATE 325 (65 FE) MG PO TABS
325.0000 mg | ORAL_TABLET | Freq: Two times a day (BID) | ORAL | Status: DC
  Administered 2015-01-24 – 2015-01-26 (×4): 325 mg via ORAL
  Filled 2015-01-24 (×6): qty 1

## 2015-01-24 MED ORDER — DEXTROSE 5 % IV SOLN
500.0000 mg | INTRAVENOUS | Status: DC
  Administered 2015-01-24 – 2015-01-25 (×2): 500 mg via INTRAVENOUS
  Filled 2015-01-24 (×4): qty 500

## 2015-01-24 MED ORDER — CARVEDILOL 6.25 MG PO TABS
6.2500 mg | ORAL_TABLET | Freq: Two times a day (BID) | ORAL | Status: DC
  Administered 2015-01-25 (×2): 6.25 mg via ORAL
  Filled 2015-01-24 (×7): qty 1

## 2015-01-24 MED ORDER — MORPHINE SULFATE 2 MG/ML IJ SOLN: 1.0000 mg | INTRAMUSCULAR | Status: DC | PRN

## 2015-01-24 MED ORDER — IPRATROPIUM-ALBUTEROL 0.5-2.5 (3) MG/3ML IN SOLN
3.0000 mL | Freq: Once | RESPIRATORY_TRACT | Status: AC
  Administered 2015-01-24: 3 mL via RESPIRATORY_TRACT
  Filled 2015-01-24: qty 3

## 2015-01-24 MED ORDER — MIRTAZAPINE 7.5 MG PO TABS
7.5000 mg | ORAL_TABLET | Freq: Every day | ORAL | Status: DC
  Filled 2015-01-24: qty 1

## 2015-01-24 MED ORDER — GUAIFENESIN ER 600 MG PO TB12
1200.0000 mg | ORAL_TABLET | Freq: Two times a day (BID) | ORAL | Status: DC
  Administered 2015-01-24 – 2015-01-26 (×5): 1200 mg via ORAL
  Filled 2015-01-24 (×6): qty 2

## 2015-01-24 MED ORDER — IPRATROPIUM-ALBUTEROL 0.5-2.5 (3) MG/3ML IN SOLN
3.0000 mL | RESPIRATORY_TRACT | Status: DC
  Filled 2015-01-24: qty 3

## 2015-01-24 MED ORDER — SODIUM CHLORIDE 0.9 % IV SOLN
INTRAVENOUS | Status: DC
  Administered 2015-01-24 – 2015-01-25 (×2): via INTRAVENOUS

## 2015-01-24 MED ORDER — AMIODARONE HCL 100 MG PO TABS
100.0000 mg | ORAL_TABLET | Freq: Every day | ORAL | Status: DC
  Administered 2015-01-25 – 2015-01-26 (×2): 100 mg via ORAL
  Filled 2015-01-24 (×2): qty 1

## 2015-01-24 MED ORDER — LEVOFLOXACIN IN D5W 500 MG/100ML IV SOLN
500.0000 mg | Freq: Once | INTRAVENOUS | Status: DC
  Filled 2015-01-24: qty 100

## 2015-01-24 MED ORDER — HYDRALAZINE HCL 50 MG PO TABS
75.0000 mg | ORAL_TABLET | Freq: Three times a day (TID) | ORAL | Status: DC
  Administered 2015-01-24 – 2015-01-26 (×5): 75 mg via ORAL
  Filled 2015-01-24 (×7): qty 1

## 2015-01-24 MED ORDER — ENSURE ENLIVE PO LIQD
237.0000 mL | Freq: Two times a day (BID) | ORAL | Status: DC
  Administered 2015-01-25 – 2015-01-26 (×2): 237 mL via ORAL

## 2015-01-24 MED ORDER — SODIUM CHLORIDE 0.9 % IJ SOLN
3.0000 mL | Freq: Two times a day (BID) | INTRAMUSCULAR | Status: DC
  Administered 2015-01-25: 3 mL via INTRAVENOUS

## 2015-01-24 MED ORDER — ACETAMINOPHEN 650 MG RE SUPP: 650.0000 mg | Freq: Four times a day (QID) | RECTAL | Status: DC | PRN

## 2015-01-24 MED ORDER — SODIUM CHLORIDE 0.9 % IV SOLN
Freq: Once | INTRAVENOUS | Status: AC
  Administered 2015-01-24: 11:00:00 via INTRAVENOUS

## 2015-01-24 NOTE — ED Notes (Addendum)
Pt and family reports sob, productive cough with green sputum x 1 week. Having generalized fatigue and weakness for extended amount of time.

## 2015-01-24 NOTE — ED Notes (Signed)
Attempted report 

## 2015-01-24 NOTE — H&P (Signed)
[facility] History and Physical  Eliyas Suddreth Varnadore XBJ:[id number] DOB: February 15, 1951 DOA: 01/24/2015  Referring physician: EDP PCP: Tanya Nones, MD   Chief Complaint: SOB  HPI: Whitney Bingaman is a 64 y.o. male with past medical history of diabetes mellitus type 2, chronic combined CHF and stage IV CKD, patient also has A. fib and pacemaker. Patient came into the hospital complaining about shortness of breath. Patient's symptoms started several days ago, has shortness of breath, subjective fevers, cough with light greenish sputum. His daughter low was recently getting over the flu but he never had significant fever. In the ER CXR showed no evidence of pneumonia but patient has significant rhonchi admitted to the hospital for further evaluation.  Review of Systems:  Constitutional: negative for anorexia, fevers and sweats Eyes: negative for irritation, redness and visual disturbance Ears, nose, mouth, throat, and face: negative for earaches, epistaxis, nasal congestion and sore throat Respiratory: Positive for cough, dyspnea on exertion, sputum but no wheezing Cardiovascular: negative for chest pain, dyspnea, lower extremity edema, orthopnea, palpitations and syncope Gastrointestinal: negative for abdominal pain, constipation, diarrhea, melena, nausea and vomiting Genitourinary:negative for dysuria, frequency and hematuria Hematologic/lymphatic: negative for bleeding, easy bruising and lymphadenopathy Musculoskeletal:negative for arthralgias, muscle weakness and stiff joints Neurological: negative for coordination problems, gait problems, headaches and weakness Endocrine: negative for diabetic symptoms including polydipsia, polyuria and weight loss Allergic/Immunologic: negative for anaphylaxis, hay fever and urticaria  Past Medical History  Diagnosis Date  . Diabetes mellitus with nephropathy   . Systolic and diastolic CHF, chronic     a. Echo at [facility] 11/17/2013  Ef <25%, severe hypokinesis of LV, moderately dilated LA, grade IV/IV diastolic dysfunction with irreversible restrictive pattern of mitral inflow, severe pulm HTN (RVSP >62mmHg), 1-2+ MR  b. Echo at [facility] 04/02/14 EF 25-30%, moderate concentric LVH, diffuse hypokinesis, moderately dilated LA, no significant MR observed  . CAD (coronary artery disease)     a. PCI LAD/RCA in 2007  b. PCI LAD/RCA in 2009 at [facility]  c. PCI LAD 08/2013, CTO of RCA and nonobstructive disease in the LCx.  Marland Kitchen Hyperlipidemia   . Stroke   . Dementia   . Ischemic cardiomyopathy     a. s/p Biotronik ICD 06/2014.  Marland Kitchen Retinopathy     bilateral  . CKD (chronic kidney disease), stage IV   . HTN (hypertension)   . Abnormal renal ultrasound     a. 03/2014: Abnormal renal ultrasound with left renal lesion and bladder wall thickening and microscopic hematuria. Multiple cysts on prior MRI. Given inability to use contrast, f/u imaging limited, repeat US 3 months with outpatient uro f/u.  Marland Kitchen Paroxysmal atrial fibrillation   . Paroxysmal atrial flutter    Past Surgical History  Procedure Laterality Date  . Coronary stent placement      Multivessel coronary intervention in 2007 and 2009. Ischemic cardiomyopathyPCI LAD/ RCA in 2007 by Dr. Dwyane Dee. PCI LAD/RCA in 2009 at [facility]; PCI LAD 08/2013. Presented with unstable angina pectoris.. Coronary angio demonstrated proximal and distal LAD lesions.52/20 PROMUS stent in proximal LAD// PTCA of distal LAD  . Cardiac defibrillator placement    . Spine surgery      anterior fusion  . Hernia repair    . Tee without cardioversion N/A 07/26/2014    Procedure: TRANSESOPHAGEAL ECHOCARDIOGRAM (TEE);  Surgeon: Larey Dresser, MD;  Location: [place];  Service: Cardiovascular;  Laterality: N/A;  . Cardioversion N/A 07/26/2014    Procedure: CARDIOVERSION;  Surgeon: Kirk Ruths  Claris Gladden, MD;  Location: [place];  Service: Cardiovascular;  Laterality: N/A;  . Right heart catheterization  N/A 07/25/2014    Procedure: RIGHT HEART CATH;  Surgeon: Jolaine Artist, MD;  Location: [facility] CATH LAB;  Service: Cardiovascular;  Laterality: N/A;  . Cardiac defibrillator placement  06/2014   Social History:   reports that he has never smoked. He does not have any smokeless tobacco history on file. He reports that he does not drink alcohol or use illicit drugs.  No Known Allergies  Family History  Problem Relation Age of Onset  . Heart disease      brother  . Diabetes Mother   . Cancer Father     brother  . Kidney disease     Prior to Admission medications   Medication Sig Start Date End Date Taking? Authorizing Provider  albuterol (PROVENTIL HFA;VENTOLIN HFA) 108 (90 BASE) MCG/ACT inhaler Inhale 2 puffs into the lungs every 6 (six) hours as needed for wheezing or shortness of breath.   Yes Historical Provider, MD  albuterol (PROVENTIL) (2.5 MG/3ML) 0.083% nebulizer solution Take 2.5 mg by nebulization every 6 (six) hours as needed for wheezing or shortness of breath.   Yes Historical Provider, MD  amiodarone (PACERONE) 200 MG tablet Take 100 mg by mouth daily.   Yes Historical Provider, MD  atorvastatin (LIPITOR) 40 MG tablet Take 1 tablet (40 mg total) by mouth daily. 04/08/14  Yes Jessica U Vann, DO  bumetanide (BUMEX) 1 MG tablet Take 1 mg by mouth 2 (two) times daily.   Yes Historical Provider, MD  carvedilol (COREG) 6.25 MG tablet Take 1 tablet (6.25 mg total) by mouth 2 (two) times daily with a meal. 07/31/14  Yes Tasrif Ahmed, MD  cholecalciferol (VITAMIN D) 1000 UNITS tablet Take 2,000 Units by mouth daily.   Yes Historical Provider, MD  edoxaban (SAVAYSA) 30 MG TABS tablet Take 1 tablet (30 mg total) by mouth daily. 07/31/14  Yes Tasrif Ahmed, MD  ferrous sulfate 325 (65 FE) MG tablet Take 1 tablet (325 mg total) by mouth 2 (two) times daily with a meal. 07/31/14  Yes Tasrif Ahmed, MD  hydrALAZINE (APRESOLINE) 50 MG tablet Take 1.5 tablets (75 mg total) by mouth 3 (three) times  daily. 01/10/15  Yes Larey Dresser, MD  insulin aspart (NOVOLOG) 100 UNIT/ML injection Inject 10 Units into the skin 3 (three) times daily with meals. 07/31/14  Yes Tasrif Ahmed, MD  insulin glargine (LANTUS) 100 UNIT/ML injection Inject 0.25 mLs (25 Units total) into the skin at bedtime. 07/31/14  Yes Tasrif Ahmed, MD  isosorbide mononitrate (IMDUR) 60 MG 24 hr tablet Take 60 mg by mouth daily. 06/23/14 06/23/15 Yes Historical Provider, MD  mirtazapine (REMERON) 7.5 MG tablet Take 7.5 mg by mouth. 01/05/15 02/04/15 Yes Historical Provider, MD  mometasone-formoterol (DULERA) 200-5 MCG/ACT AERO Inhale 2 puffs into the lungs 2 (two) times daily.   Yes Historical Provider, MD  Multiple Vitamin (MULTIVITAMIN WITH MINERALS) TABS tablet Take 1 tablet by mouth daily.   Yes Historical Provider, MD  potassium chloride (K-DUR) 10 MEQ tablet Take 2 tablets (20 mEq total) by mouth daily. 01/10/15  Yes Larey Dresser, MD   Physical Exam: Filed Vitals:   01/24/15 1230  BP: 123/62  Pulse: 49  Temp:   Resp: 18   Constitutional: Oriented to person, place, and time. Well-developed and well-nourished. Cooperative.  Head: Normocephalic and atraumatic.  Nose: Nose normal.  Mouth/Throat: Uvula is midline, oropharynx is clear and  moist and mucous membranes are normal.  Eyes: Conjunctivae and EOM are normal. Pupils are equal, round, and reactive to light.  Neck: Trachea normal and normal range of motion. Neck supple.  Cardiovascular: Normal rate, regular rhythm, S1 normal, S2 normal, normal heart sounds and intact distal pulses.   Pulmonary/Chest: Effort normal and breath sounds normal.  Abdominal: Soft. Bowel sounds are normal. There is no hepatosplenomegaly. There is no tenderness.  Musculoskeletal: Normal range of motion.  Neurological: Alert and oriented to person, place, and time. Has normal strength. No cranial nerve deficit or sensory deficit.  Skin: Skin is warm, dry and intact.  Psychiatric: Has a normal  mood and affect. Speech is normal and behavior is normal.   Labs on Admission:  Basic Metabolic Panel:  Recent Labs Lab 01/24/15 0946  NA 136  K 3.8  CL 100  CO2 29  GLUCOSE 199*  BUN 82*  CREATININE 5.78*  CALCIUM 11.4*   Liver Function Tests:  Recent Labs Lab 01/24/15 0946  AST 36  ALT 24  ALKPHOS 189*  BILITOT 1.5*  PROT 7.6  ALBUMIN 3.6   No results for input(s): LIPASE, AMYLASE in the last 168 hours. No results for input(s): AMMONIA in the last 168 hours. CBC:  Recent Labs Lab 01/24/15 0946  WBC 3.9*  NEUTROABS 2.6  HGB 8.9*  HCT 28.2*  MCV 94.0  PLT 140*   Cardiac Enzymes: No results for input(s): CKTOTAL, CKMB, CKMBINDEX, TROPONINI in the last 168 hours.  BNP (last 3 results) No results for input(s): BNP in the last 8760 hours.  ProBNP (last 3 results)  Recent Labs  07/16/14 1401  PROBNP 12348.0*    CBG: No results for input(s): GLUCAP in the last 168 hours.  Radiological Exams on Admission: Dg Chest 2 View  01/24/2015   CLINICAL DATA:  Weakness  EXAM: CHEST  2 VIEW  COMPARISON:  07/16/2014  FINDINGS: Cardiac enlargement. Negative for heart failure. Coronary artery stent. AICD unchanged in position.  Small right pleural effusion. Negative for edema. No effusion on the left. Negative for pneumonia.  IMPRESSION: Cardiac enlargement with small right pleural effusion. Negative for pulmonary edema.   Electronically Signed   By: Franchot Gallo M.D.   On: 01/24/2015 10:35    EKG: Independently reviewed. Paced ECG  Assessment/Plan Principal Problem:   Acute bronchitis Active Problems:   Chronic kidney disease (CKD), stage IV (severe)   DM (diabetes mellitus), type 2, uncontrolled, with renal complications   Essential hypertension, benign   Chronic combined systolic and diastolic CHF (congestive heart failure)   PAF (paroxysmal atrial fibrillation)   Hypercalcemia    Acute bronchitis As mentioned above chest x-ray did not show  infiltrates, patient came in with cough, SOB and sputum production. Started on levofloxacin, switched to Rocephin and azithromycin. Continue supportive management with bronchodilators, mucolytics and oxygen as needed.  Acute on chronic CKD Patient is on Bumex, discontinued, started on slow rate of IV fluids normal saline at 70 mL per hour for 1 day. Check BMP in a.m.  Hypercalcemia Likely secondary to dehydration, hydrate with IV fluids, check calcium a.m.  Paroxysmal atrial fibrillation Patient has paced rhythm, patient getting Savaysa for anticoagulation. Heart rate is controlled with Pacerone and Coreg.  Chronic combined CHF Continue Coreg, hold Bumex because of acute on chronic renal failure. Patient given IV fluids for 1 day, discontinued in a.m. and follow on BMP.  Acute kidney injury on CKD stage IV Baseline creatinine about 3.9, patient presented with  creatinine of 5.5. Bumex discontinued, started on normal saline at 75 mL per hour for 1 day, check BMP in a.m.  Code Status: Full code Family Communication: Plan discussed with the patient in presence of his son at bedside Disposition Plan: Telemetry, inpatient  Time spent: 70 minutes  [facility] A, MD [facility] Pager [phone]

## 2015-01-24 NOTE — ED Provider Notes (Signed)
CSN: [id number]     Arrival date & time 01/24/15  0914 History   First MD Initiated Contact with Patient 01/24/15 [phone]     Chief Complaint  Patient presents with  . Shortness of Breath     (Consider location/radiation/quality/duration/timing/severity/associated sxs/prior Treatment) Patient is a 64 y.o. male presenting with shortness of breath. The history is provided by the patient (pt complains of cough sob and weakness).  Shortness of Breath Severity:  Moderate Onset quality:  Gradual Timing:  Constant Progression:  Worsening Chronicity:  New Context: activity   Associated symptoms: cough   Associated symptoms: no abdominal pain, no chest pain, no headaches and no rash     Past Medical History  Diagnosis Date  . Diabetes mellitus with nephropathy   . Systolic and diastolic CHF, chronic     a. Echo at [facility] 11/17/2013 Ef <25%, severe hypokinesis of LV, moderately dilated LA, grade IV/IV diastolic dysfunction with irreversible restrictive pattern of mitral inflow, severe pulm HTN (RVSP >78mmHg), 1-2+ MR  b. Echo at [facility] 04/02/14 EF 25-30%, moderate concentric LVH, diffuse hypokinesis, moderately dilated LA, no significant MR observed  . CAD (coronary artery disease)     a. PCI LAD/RCA in 2007  b. PCI LAD/RCA in 2009 at [facility]  c. PCI LAD 08/2013, CTO of RCA and nonobstructive disease in the LCx.  Marland Kitchen Hyperlipidemia   . Stroke   . Dementia   . Ischemic cardiomyopathy     a. s/p Biotronik ICD 06/2014.  Marland Kitchen Retinopathy     bilateral  . CKD (chronic kidney disease), stage IV   . HTN (hypertension)   . Abnormal renal ultrasound     a. 03/2014: Abnormal renal ultrasound with left renal lesion and bladder wall thickening and microscopic hematuria. Multiple cysts on prior MRI. Given inability to use contrast, f/u imaging limited, repeat US 3 months with outpatient uro f/u.  Marland Kitchen Paroxysmal atrial fibrillation   . Paroxysmal atrial flutter    Past Surgical History  Procedure  Laterality Date  . Coronary stent placement      Multivessel coronary intervention in 2007 and 2009. Ischemic cardiomyopathyPCI LAD/ RCA in 2007 by Dr. Dwyane Dee. PCI LAD/RCA in 2009 at [facility]; PCI LAD 08/2013. Presented with unstable angina pectoris.. Coronary angio demonstrated proximal and distal LAD lesions.79/20 PROMUS stent in proximal LAD// PTCA of distal LAD  . Cardiac defibrillator placement    . Spine surgery      anterior fusion  . Hernia repair    . Tee without cardioversion N/A 07/26/2014    Procedure: TRANSESOPHAGEAL ECHOCARDIOGRAM (TEE);  Surgeon: Larey Dresser, MD;  Location: [place];  Service: Cardiovascular;  Laterality: N/A;  . Cardioversion N/A 07/26/2014    Procedure: CARDIOVERSION;  Surgeon: Larey Dresser, MD;  Location: [place];  Service: Cardiovascular;  Laterality: N/A;  . Right heart catheterization N/A 07/25/2014    Procedure: RIGHT HEART CATH;  Surgeon: Jolaine Artist, MD;  Location: [facility] CATH LAB;  Service: Cardiovascular;  Laterality: N/A;  . Cardiac defibrillator placement  06/2014   Family History  Problem Relation Age of Onset  . Heart disease      brother  . Diabetes Mother   . Cancer Father     brother  . Kidney disease     History  Substance Use Topics  . Smoking status: Never Smoker   . Smokeless tobacco: Not on file  . Alcohol Use: No    Review of Systems  Constitutional: Negative for appetite  change and fatigue.  HENT: Negative for congestion, ear discharge and sinus pressure.   Eyes: Negative for discharge.  Respiratory: Positive for cough and shortness of breath.   Cardiovascular: Negative for chest pain.  Gastrointestinal: Negative for abdominal pain and diarrhea.  Genitourinary: Negative for frequency and hematuria.  Musculoskeletal: Negative for back pain.  Skin: Negative for rash.  Neurological: Negative for seizures and headaches.  Psychiatric/Behavioral: Negative for hallucinations.      Allergies  Review  of patient's allergies indicates no known allergies.  Home Medications   Prior to Admission medications   Medication Sig Start Date End Date Taking? Authorizing Provider  albuterol (PROVENTIL HFA;VENTOLIN HFA) 108 (90 BASE) MCG/ACT inhaler Inhale 2 puffs into the lungs every 6 (six) hours as needed for wheezing or shortness of breath.   Yes Historical Provider, MD  albuterol (PROVENTIL) (2.5 MG/3ML) 0.083% nebulizer solution Take 2.5 mg by nebulization every 6 (six) hours as needed for wheezing or shortness of breath.   Yes Historical Provider, MD  amiodarone (PACERONE) 200 MG tablet Take 100 mg by mouth daily.   Yes Historical Provider, MD  atorvastatin (LIPITOR) 40 MG tablet Take 1 tablet (40 mg total) by mouth daily. 04/08/14  Yes Jessica U Vann, DO  bumetanide (BUMEX) 1 MG tablet Take 1 mg by mouth 2 (two) times daily.   Yes Historical Provider, MD  carvedilol (COREG) 6.25 MG tablet Take 1 tablet (6.25 mg total) by mouth 2 (two) times daily with a meal. 07/31/14  Yes Tasrif Ahmed, MD  cholecalciferol (VITAMIN D) 1000 UNITS tablet Take 2,000 Units by mouth daily.   Yes Historical Provider, MD  edoxaban (SAVAYSA) 30 MG TABS tablet Take 1 tablet (30 mg total) by mouth daily. 07/31/14  Yes Tasrif Ahmed, MD  ferrous sulfate 325 (65 FE) MG tablet Take 1 tablet (325 mg total) by mouth 2 (two) times daily with a meal. 07/31/14  Yes Tasrif Ahmed, MD  hydrALAZINE (APRESOLINE) 50 MG tablet Take 1.5 tablets (75 mg total) by mouth 3 (three) times daily. 01/10/15  Yes Larey Dresser, MD  insulin aspart (NOVOLOG) 100 UNIT/ML injection Inject 10 Units into the skin 3 (three) times daily with meals. 07/31/14  Yes Tasrif Ahmed, MD  insulin glargine (LANTUS) 100 UNIT/ML injection Inject 0.25 mLs (25 Units total) into the skin at bedtime. 07/31/14  Yes Tasrif Ahmed, MD  isosorbide mononitrate (IMDUR) 60 MG 24 hr tablet Take 60 mg by mouth daily. 06/23/14 06/23/15 Yes Historical Provider, MD  mirtazapine (REMERON) 7.5 MG  tablet Take 7.5 mg by mouth. 01/05/15 02/04/15 Yes Historical Provider, MD  mometasone-formoterol (DULERA) 200-5 MCG/ACT AERO Inhale 2 puffs into the lungs 2 (two) times daily.   Yes Historical Provider, MD  Multiple Vitamin (MULTIVITAMIN WITH MINERALS) TABS tablet Take 1 tablet by mouth daily.   Yes Historical Provider, MD  potassium chloride (K-DUR) 10 MEQ tablet Take 2 tablets (20 mEq total) by mouth daily. 01/10/15  Yes Larey Dresser, MD   BP 131/62 mmHg  Pulse 50  Temp(Src) 98.4 F (36.9 C) (Oral)  Resp 19  Ht 5\' 6"  (1.676 m)  Wt 183 lb 14.4 oz (83.416 kg)  BMI 29.70 kg/m2  SpO2 98% Physical Exam  Constitutional: He is oriented to person, place, and time. He appears well-developed.  HENT:  Head: Normocephalic.  Eyes: Conjunctivae and EOM are normal. No scleral icterus.  Neck: Neck supple. No thyromegaly present.  Cardiovascular: Normal rate and regular rhythm.  Exam reveals no gallop and no  friction rub.   No murmur heard. Pulmonary/Chest: No stridor. He has wheezes. He has no rales. He exhibits no tenderness.  Abdominal: He exhibits no distension. There is no tenderness. There is no rebound.  Musculoskeletal: Normal range of motion. He exhibits no edema.  Lymphadenopathy:    He has no cervical adenopathy.  Neurological: He is oriented to person, place, and time. He exhibits normal muscle tone. Coordination normal.  Skin: No rash noted. No erythema.  Psychiatric: He has a normal mood and affect. His behavior is normal.    ED Course  Procedures (including critical care time) Labs Review Labs Reviewed  CBC WITH DIFFERENTIAL/PLATELET - Abnormal; Notable for the following:    WBC 3.9 (*)    RBC 3.00 (*)    Hemoglobin 8.9 (*)    HCT 28.2 (*)    Platelets 140 (*)    All other components within normal limits  COMPREHENSIVE METABOLIC PANEL - Abnormal; Notable for the following:    Glucose, Bld 199 (*)    BUN 82 (*)    Creatinine, Ser 5.78 (*)    Calcium 11.4 (*)     Alkaline Phosphatase 189 (*)    Total Bilirubin 1.5 (*)    GFR calc non Af Amer 9 (*)    GFR calc Af Amer 11 (*)    All other components within normal limits    Imaging Review Dg Chest 2 View  01/24/2015   CLINICAL DATA:  Weakness  EXAM: CHEST  2 VIEW  COMPARISON:  07/16/2014  FINDINGS: Cardiac enlargement. Negative for heart failure. Coronary artery stent. AICD unchanged in position.  Small right pleural effusion. Negative for edema. No effusion on the left. Negative for pneumonia.  IMPRESSION: Cardiac enlargement with small right pleural effusion. Negative for pulmonary edema.   Electronically Signed   By: Franchot Gallo M.D.   On: 01/24/2015 10:35     EKG Interpretation   Date/Time:  Wednesday January 24 2015 09:18:09 EDT Ventricular Rate:  50 PR Interval:  214 QRS Duration: 132 QT Interval:  534 QTC Calculation: 486 R Axis:   73 Text Interpretation:  Undetermined rhythm Non-specific intra-ventricular  conduction block Nonspecific T wave abnormality Abnormal ECG Confirmed by  Ariday Brinker  MD, Maple Odaniel [phone]) on 01/24/2015 12:00:26 PM      MDM   Final diagnoses:  Weak  Hypercalcemia    admit    Milton Ferguson, MD 01/24/15 1222

## 2015-01-24 NOTE — ED Notes (Signed)
Admitting at bedside 

## 2015-01-25 LAB — GLUCOSE, CAPILLARY
GLUCOSE-CAPILLARY: 105 mg/dL — AB (ref 70–99)
GLUCOSE-CAPILLARY: 107 mg/dL — AB (ref 70–99)
GLUCOSE-CAPILLARY: 66 mg/dL — AB (ref 70–99)
GLUCOSE-CAPILLARY: 67 mg/dL — AB (ref 70–99)
Glucose-Capillary: 72 mg/dL (ref 70–99)
Glucose-Capillary: 98 mg/dL (ref 70–99)
Glucose-Capillary: 44 mg/dL — CL (ref 70–99)

## 2015-01-25 LAB — BASIC METABOLIC PANEL
Anion gap: 10 (ref 5–15)
BUN: 81 mg/dL — ABNORMAL HIGH (ref 6–23)
CHLORIDE: 102 mmol/L (ref 96–112)
CO2: 24 mmol/L (ref 19–32)
Calcium: 10.2 mg/dL (ref 8.4–10.5)
Creatinine, Ser: 5.51 mg/dL — ABNORMAL HIGH (ref 0.50–1.35)
GFR calc non Af Amer: 10 mL/min — ABNORMAL LOW (ref >90)
GFR, EST AFRICAN AMERICAN: 12 mL/min — AB (ref >90)
Glucose, Bld: 148 mg/dL — ABNORMAL HIGH (ref 70–99)
Potassium: 3.5 mmol/L (ref 3.5–5.1)
SODIUM: 136 mmol/L (ref 135–145)

## 2015-01-25 LAB — CBC
HEMATOCRIT: 25.5 % — AB (ref 39.0–52.0)
Hemoglobin: 8.2 g/dL — ABNORMAL LOW (ref 13.0–17.0)
MCH: 30 pg (ref 26.0–34.0)
MCHC: 32.2 g/dL (ref 30.0–36.0)
MCV: 93.4 fL (ref 78.0–100.0)
Platelets: 134 10*3/uL — ABNORMAL LOW (ref 150–400)
RBC: 2.73 MIL/uL — AB (ref 4.22–5.81)
RDW: 15.5 % (ref 11.5–15.5)
WBC: 4.4 10*3/uL (ref 4.0–10.5)

## 2015-01-25 LAB — HEMOGLOBIN A1C
HEMOGLOBIN A1C: 7.3 % — AB (ref 4.8–5.6)
Mean Plasma Glucose: 163 mg/dL

## 2015-01-25 MED ORDER — SODIUM CHLORIDE 0.9 % IV SOLN
INTRAVENOUS | Status: DC
  Administered 2015-01-25 (×2): via INTRAVENOUS

## 2015-01-25 NOTE — Progress Notes (Signed)
INITIAL NUTRITION ASSESSMENT  DOCUMENTATION CODES Per approved criteria  -Not Applicable   INTERVENTION: -Continue Ensure Enlive po BID, each supplement provides 350 kcal and 20 grams of protein  NUTRITION DIAGNOSIS: Predicted suboptimal nutrient intake related to hx of decreased appetite and wt loss as evidenced by 16.9% wt loss x 3 months.   Goal: Pt will meet >90% of estimated nutritional needs  Monitor:  PO/supplement intake, labs, weight changes, I/O's  Reason for Assessment: MST=2  65 y.o. male  Admitting Dx: Acute bronchitis  Tony Freeman is a 64 y.o. male with past medical history of diabetes mellitus type 2, chronic combined CHF and stage IV CKD, patient also has A. fib and pacemaker. Patient came into the hospital complaining about shortness of breath. Patient's symptoms started several days ago, has shortness of breath, subjective fevers, cough with light greenish sputum. His daughter low was recently getting over the flu but he never had significant fever.  ASSESSMENT: Pt admitted with acute bronchitis.  Hx obtained mainly by pt son at bedside. Both confirm poor appetite and weight loss over the past 6 months. Pt reports UBW of 210#, which he last weighed around 6 months ago. Wt changes confirmed by documented wt hx. However, pt reports he weighed around 185# this morning, which he feels is a more accurate reflection of his current wt.  Per pt son, pt appetite has been decreased over the past 6 months due to weakness and anemia. He originally started to lose weight intentionally, however, noted dramatic change appetite over the past 2 months.  Pt consumed 75% of his meal tray (all except his broccoli and 1/2 of his applesauce). He reports he drinks one Ensure shake every morning at home and would like to continue current supplement order. Pt son reveals pt follows a low salt diet at home.  Nutrition-focused physical exam reveals no signs of fat or muscle  depletion. Pt shares with this RD that his legs look "skinnier" and his clothes are looser than normal. Discussed importance of good meal and supplement intake to promote healing. Also encouraged continued use of Ensure supplement at home.  Labs reviewed. BUN/Creat: 81/5,51, Glucose: 148. CBGS: 66-105.   Height: Ht Readings from Last 1 Encounters:  01/24/15 5\' 6"  (1.676 m)    Weight: Wt Readings from Last 1 Encounters:  01/24/15 171 lb 15.3 oz (78 kg)    Ideal Body Weight: 142#  % Ideal Body Weight: 120%  Wt Readings from Last 10 Encounters:  01/24/15 171 lb 15.3 oz (78 kg)  01/16/15 185 lb 14.4 oz (84.324 kg)  11/29/14 190 lb (86.183 kg)  11/24/14 189 lb 4 oz (85.843 kg)  09/28/14 206 lb 4 oz (93.554 kg)  07/31/14 204 lb 9.4 oz (92.8 kg)  04/08/14 215 lb 6.2 oz (97.7 kg)    Usual Body Weight: 210#  % Usual Body Weight: 81%  BMI:  Body mass index is 27.77 kg/(m^2). Overweight  Estimated Nutritional Needs: Kcal: 1950-2150 Protein: 85-95 grams Fluid: 2.0-2.2 L  Skin: WDL  Diet Order: Diet heart healthy/carb modified Room service appropriate?: Yes; Fluid consistency:: Thin  EDUCATION NEEDS: -Education needs addressed   Intake/Output Summary (Last 24 hours) at 01/25/15 1305 Last data filed at 01/25/15 1137  Gross per 24 hour  Intake 2796.25 ml  Output   1600 ml  Net 1196.25 ml    Last BM: 01/25/15   Labs:   Recent Labs Lab 01/24/15 0946 01/25/15 0540  NA 136 136  K 3.8 3.5  CL 100 102  CO2 29 24  BUN 82* 81*  CREATININE 5.78* 5.51*  CALCIUM 11.4* 10.2  GLUCOSE 199* 148*    CBG (last 3)   Recent Labs  01/25/15 0047 01/25/15 0755 01/25/15 1151  GLUCAP 66* 105* 72    Scheduled Meds: . amiodarone  100 mg Oral Daily  . antiseptic oral rinse  7 mL Mouth Rinse BID  . atorvastatin  40 mg Oral Daily  . azithromycin  500 mg Intravenous Q24H  . carvedilol  6.25 mg Oral BID WC  . cefTRIAXone (ROCEPHIN)  IV  1 g Intravenous Q24H  .  cholecalciferol  2,000 Units Oral Daily  . edoxaban  30 mg Oral Daily  . feeding supplement (ENSURE ENLIVE)  237 mL Oral BID BM  . ferrous sulfate  325 mg Oral BID WC  . guaiFENesin  1,200 mg Oral BID  . hydrALAZINE  75 mg Oral TID  . insulin aspart  10 Units Subcutaneous TID WC  . insulin glargine  25 Units Subcutaneous QHS  . isosorbide mononitrate  60 mg Oral Daily  . mirtazapine  7.5 mg Oral QHS  . mometasone-formoterol  2 puff Inhalation BID  . multivitamin with minerals  1 tablet Oral Daily  . potassium chloride  20 mEq Oral Daily  . sodium chloride  3 mL Intravenous Q12H    Continuous Infusions: . sodium chloride 75 mL/hr at 01/25/15 1005    Past Medical History  Diagnosis Date  . Diabetes mellitus with nephropathy   . Systolic and diastolic CHF, chronic     a. Echo at [facility] 11/17/2013 Ef <25%, severe hypokinesis of LV, moderately dilated LA, grade IV/IV diastolic dysfunction with irreversible restrictive pattern of mitral inflow, severe pulm HTN (RVSP >30mmHg), 1-2+ MR  b. Echo at [facility] 04/02/14 EF 25-30%, moderate concentric LVH, diffuse hypokinesis, moderately dilated LA, no significant MR observed  . CAD (coronary artery disease)     a. PCI LAD/RCA in 2007  b. PCI LAD/RCA in 2009 at [facility]  c. PCI LAD 08/2013, CTO of RCA and nonobstructive disease in the LCx.  Marland Kitchen Hyperlipidemia   . Stroke   . Dementia   . Ischemic cardiomyopathy     a. s/p Biotronik ICD 06/2014.  Marland Kitchen Retinopathy     bilateral  . CKD (chronic kidney disease), stage IV   . HTN (hypertension)   . Abnormal renal ultrasound     a. 03/2014: Abnormal renal ultrasound with left renal lesion and bladder wall thickening and microscopic hematuria. Multiple cysts on prior MRI. Given inability to use contrast, f/u imaging limited, repeat US 3 months with outpatient uro f/u.  Marland Kitchen Paroxysmal atrial fibrillation   . Paroxysmal atrial flutter     Past Surgical History  Procedure Laterality Date  . Coronary stent  placement      Multivessel coronary intervention in 2007 and 2009. Ischemic cardiomyopathyPCI LAD/ RCA in 2007 by Dr. Dwyane Dee. PCI LAD/RCA in 2009 at [facility]; PCI LAD 08/2013. Presented with unstable angina pectoris.. Coronary angio demonstrated proximal and distal LAD lesions.73/20 PROMUS stent in proximal LAD// PTCA of distal LAD  . Cardiac defibrillator placement    . Spine surgery      anterior fusion  . Hernia repair    . Tee without cardioversion N/A 07/26/2014    Procedure: TRANSESOPHAGEAL ECHOCARDIOGRAM (TEE);  Surgeon: Larey Dresser, MD;  Location: [place];  Service: Cardiovascular;  Laterality: N/A;  . Cardioversion N/A 07/26/2014    Procedure: CARDIOVERSION;  Surgeon: Larey Dresser, MD;  Location[facility] ENDOSCOPY;  Service: Cardiovascular;  Laterality: N/A;  . Right heart catheterization N/A 07/25/2014    Procedure: RIGHT HEART CATH;  Surgeon: Jolaine Artist, MD;  Location: [facility] CATH LAB;  Service: Cardiovascular;  Laterality: N/A;  . Cardiac defibrillator placement  06/2014    Tony Freeman A. Jimmye Norman, RD, LDN, CDE Pager: [phone] After hours Pager: [phone]

## 2015-01-25 NOTE — Progress Notes (Signed)
Patient Demographics  Tony Freeman, is a 64 y.o. male, DOB - [birth year], FTD:[id number]  Admit date - 01/24/2015   Admitting Physician Verlee Monte, MD  Outpatient Primary MD for the patient is Freeman, Tony Gails, MD  LOS - 1   Chief Complaint  Patient presents with  . Shortness of Breath        Subjective:   Tony Freeman today has, No headache, No chest pain, No abdominal pain - No Nausea, No new weakness tingling or numbness, No Cough - Improved SOB.    Assessment & Plan    1. Acute bronchitis with mild asthma flare. Much improved today after empiric IV Rocephin and azithromycin, which will be continued, continue supportive care with nebulizer treatments and oxygen as needed. He is much better. Currently oxygen free likely discharge in the morning.   2. Acute renal failure on chronic kidney disease stage V. A slight creatinine of 4, clearly was dehydrated upon admission was not eating or drinking well for the last 2 days, hold diuretics, ordered nephrotoxins, continue to hydrate.   3. Hypercalcemia. Due to dehydration. Resolved after hydration which will be continued gently. Hold diuretics.   4. Paroxysmal atrial fibrillation with chads 2 Vasc score of 3 - continue amiodarone and oral anticoagulation.   5. Chronic him by end-diastolic and systolic heart failure. EF around 20-25%. Her energy dehydrated, diuretic on hold, being hydrated, in no ACE/ARB due to renal failure. Not on beta blocker at home. We'll defer long-term use of beta blocker to [facility] cardiologist and PCP.    Code Status: Full  Family Communication: None  Disposition Plan: Home   Procedures     Consults  None   Medications  Scheduled Meds: . amiodarone  100 mg Oral Daily  . antiseptic oral rinse  7 mL  Mouth Rinse BID  . atorvastatin  40 mg Oral Daily  . azithromycin  500 mg Intravenous Q24H  . carvedilol  6.25 mg Oral BID WC  . cefTRIAXone (ROCEPHIN)  IV  1 g Intravenous Q24H  . cholecalciferol  2,000 Units Oral Daily  . edoxaban  30 mg Oral Daily  . feeding supplement (ENSURE ENLIVE)  237 mL Oral BID BM  . ferrous sulfate  325 mg Oral BID WC  . guaiFENesin  1,200 mg Oral BID  . hydrALAZINE  75 mg Oral TID  . insulin aspart  10 Units Subcutaneous TID WC  . insulin glargine  25 Units Subcutaneous QHS  . isosorbide mononitrate  60 mg Oral Daily  . mirtazapine  7.5 mg Oral QHS  . mometasone-formoterol  2 puff Inhalation BID  . multivitamin with minerals  1 tablet Oral Daily  . potassium chloride  20 mEq Oral Daily  . sodium chloride  3 mL Intravenous Q12H   Continuous Infusions: . sodium chloride 75 mL/hr at 01/25/15 0518   PRN Meds:.acetaminophen **OR** acetaminophen, albuterol, guaiFENesin-dextromethorphan, HYDROcodone-acetaminophen, morphine injection  DVT Prophylaxis  Edoxaban  Lab Results  Component Value Date   PLT 134* 01/25/2015    Antibiotics     Anti-infectives    Start     Dose/Rate Route Frequency Ordered Stop   01/24/15 1300  cefTRIAXone (ROCEPHIN) 1 g in dextrose 5 % 50 mL IVPB  1 g 100 mL/hr over 30 Minutes Intravenous Every 24 hours 01/24/15 1257     01/24/15 1300  azithromycin (ZITHROMAX) 500 mg in dextrose 5 % 250 mL IVPB     500 mg 250 mL/hr over 60 Minutes Intravenous Every 24 hours 01/24/15 1257     01/24/15 1230  levofloxacin (LEVAQUIN) IVPB 500 mg  Status:  Discontinued     500 mg 100 mL/hr over 60 Minutes Intravenous  Once 01/24/15 1221 01/24/15 1327          Objective:   Filed Vitals:   01/24/15 1513 01/24/15 2100 01/25/15 0600 01/25/15 0827  BP:  114/62 118/63   Pulse:  52 53   Temp:   98.5 F (36.9 C)   TempSrc:   Oral   Resp:   18   Height:      Weight:      SpO2: 94%  98% 98%    Wt Readings from Last 3 Encounters:    01/24/15 78 kg (171 lb 15.3 oz)  01/16/15 84.324 kg (185 lb 14.4 oz)  11/29/14 86.183 kg (190 lb)     Intake/Output Summary (Last 24 hours) at 01/25/15 0953 Last data filed at 01/25/15 0935  Gross per 24 hour  Intake   1200 ml  Output   1400 ml  Net   -200 ml     Physical Exam  Awake Alert, Oriented X 3, No new F.N deficits, Normal affect Lyle.AT,PERRAL Supple Neck,No JVD, No cervical lymphadenopathy appriciated.  Symmetrical Chest wall movement, Good air movement bilaterally, mild wheezing RRR,No Gallops,Rubs or new Murmurs, No Parasternal Heave +ve B.Sounds, Abd Soft, No tenderness, No organomegaly appriciated, No rebound - guarding or rigidity. No Cyanosis, Clubbing or edema, No new Rash or bruise      Data Review   Micro Results No results found for this or any previous visit (from the past 240 hour(s)).  Radiology Reports Dg Chest 2 View  01/24/2015   CLINICAL DATA:  Weakness  EXAM: CHEST  2 VIEW  COMPARISON:  07/16/2014  FINDINGS: Cardiac enlargement. Negative for heart failure. Coronary artery stent. AICD unchanged in position.  Small right pleural effusion. Negative for edema. No effusion on the left. Negative for pneumonia.  IMPRESSION: Cardiac enlargement with small right pleural effusion. Negative for pulmonary edema.   Electronically Signed   By: Franchot Gallo M.D.   On: 01/24/2015 10:35     CBC  Recent Labs Lab 01/24/15 0946 01/25/15 0540  WBC 3.9* 4.4  HGB 8.9* 8.2*  HCT 28.2* 25.5*  PLT 140* 134*  MCV 94.0 93.4  MCH 29.7 30.0  MCHC 31.6 32.2  RDW 15.2 15.5  LYMPHSABS 0.8  --   MONOABS 0.4  --   EOSABS 0.1  --   BASOSABS 0.0  --     Chemistries   Recent Labs Lab 01/24/15 0946 01/25/15 0540  NA 136 136  K 3.8 3.5  CL 100 102  CO2 29 24  GLUCOSE 199* 148*  BUN 82* 81*  CREATININE 5.78* 5.51*  CALCIUM 11.4* 10.2  AST 36  --   ALT 24  --   ALKPHOS 189*  --   BILITOT 1.5*  --     ------------------------------------------------------------------------------------------------------------------ estimated creatinine clearance is 13.7 mL/min (by C-G formula based on Cr of 5.51). ------------------------------------------------------------------------------------------------------------------  Recent Labs  01/24/15 1446  HGBA1C 7.3*   ------------------------------------------------------------------------------------------------------------------ No results for input(s): CHOL, HDL, LDLCALC, TRIG, CHOLHDL, LDLDIRECT in the last 72 hours. ------------------------------------------------------------------------------------------------------------------  Recent Labs  01/24/15 1446  TSH 1.075   ------------------------------------------------------------------------------------------------------------------ No results for input(s): VITAMINB12, FOLATE, FERRITIN, TIBC, IRON, RETICCTPCT in the last 72 hours.  Coagulation profile  Recent Labs Lab 01/24/15 1446  INR 2.57*    No results for input(s): DDIMER in the last 72 hours.  Cardiac Enzymes No results for input(s): CKMB, TROPONINI, MYOGLOBIN in the last 168 hours.  Invalid input(s): CK ------------------------------------------------------------------------------------------------------------------ Invalid input(s): POCBNP     Time Spent in minutes   35   Ernan Runkles K M.D on 01/25/2015 at 9:53 AM  Between 7am to 7pm - Pager - [phone]  After 7pm go to [email or web address] - password [facility]  [facility]   Office  [phone]

## 2015-01-25 NOTE — Progress Notes (Signed)
hyHypoglycemic Event  CBG: 44  Treatment: 15 GM carbohydrate snack  Symptoms: Vision changes  Follow-up CBG: Time: 2130 CBG Result:65  Possible Reasons for Event: Unknown  Comments/MD notified: Rogue Bussing, NP    Arneta Cliche  Remember to initiate Hypoglycemia Order Set & complete

## 2015-01-25 NOTE — Progress Notes (Signed)
Inpatient Diabetes Program Recommendations  AACE/ADA: New Consensus Statement on Inpatient Glycemic Control (2013)  Target Ranges:  Prepandial:   less than 140 mg/dL      Peak postprandial:   less than 180 mg/dL (1-2 hours)      Critically ill patients:  140 - 180 mg/dL   Results for KOEHN, SALEHI (MRN [id number]) as of 01/25/2015 09:35  Ref. Range 01/25/2015 00:47 01/25/2015 07:55  Glucose-Capillary Latest Range: 70-99 mg/dL 66 (L) 105 (H)   Diabetes history: DM2 Outpatient Diabetes medications: Lantus 25 units QHS, Novolog 10 units TID with meals Current orders for Inpatient glycemic control: Novolog 10 units TID with meals, Lantus 25 units QHS  Inpatient Diabetes Program Recommendations Correction (SSI): Please consider ordering CBGs with Novolog sensitive correction ACHS. Insulin - Meal Coverage: CBG down to 66 mg/dl at 00:47 after receiving Novolog 10 units at 18:49 with supper. Please discontinue Novolog 10 units TID with meals at this time and use correction insulin as needed at this time.  Thanks, Barnie Alderman, RN, MSN, CCRN, CDE Diabetes Coordinator Inpatient Diabetes Program [phone] (Team Pager from [place] to [place]) [phone] ([facility] office) [phone] [facility] [place] office)

## 2015-01-26 LAB — COMPREHENSIVE METABOLIC PANEL
ALBUMIN: 3.1 g/dL — AB (ref 3.5–5.2)
ALT: 20 U/L (ref 0–53)
AST: 36 U/L (ref 0–37)
Alkaline Phosphatase: 169 U/L — ABNORMAL HIGH (ref 39–117)
Anion gap: 7 (ref 5–15)
BUN: 80 mg/dL — ABNORMAL HIGH (ref 6–23)
CO2: 25 mmol/L (ref 19–32)
Calcium: 10.1 mg/dL (ref 8.4–10.5)
Chloride: 105 mmol/L (ref 96–112)
Creatinine, Ser: 5.19 mg/dL — ABNORMAL HIGH (ref 0.50–1.35)
GFR calc Af Amer: 12 mL/min — ABNORMAL LOW (ref >90)
GFR calc non Af Amer: 11 mL/min — ABNORMAL LOW (ref >90)
GLUCOSE: 53 mg/dL — AB (ref 70–99)
POTASSIUM: 4.1 mmol/L (ref 3.5–5.1)
Sodium: 137 mmol/L (ref 135–145)
TOTAL PROTEIN: 6.6 g/dL (ref 6.0–8.3)
Total Bilirubin: 1.3 mg/dL — ABNORMAL HIGH (ref 0.3–1.2)

## 2015-01-26 LAB — CBC
HCT: 25.7 % — ABNORMAL LOW (ref 39.0–52.0)
HEMOGLOBIN: 8.2 g/dL — AB (ref 13.0–17.0)
MCH: 29.8 pg (ref 26.0–34.0)
MCHC: 31.9 g/dL (ref 30.0–36.0)
MCV: 93.5 fL (ref 78.0–100.0)
PLATELETS: 133 10*3/uL — AB (ref 150–400)
RBC: 2.75 MIL/uL — ABNORMAL LOW (ref 4.22–5.81)
RDW: 15.2 % (ref 11.5–15.5)
WBC: 4.5 10*3/uL (ref 4.0–10.5)

## 2015-01-26 LAB — PROTIME-INR
INR: 1.49 (ref 0.00–1.49)
Prothrombin Time: 18.2 seconds — ABNORMAL HIGH (ref 11.6–15.2)

## 2015-01-26 LAB — GLUCOSE, CAPILLARY
GLUCOSE-CAPILLARY: 68 mg/dL — AB (ref 70–99)
Glucose-Capillary: 105 mg/dL — ABNORMAL HIGH (ref 70–99)
Glucose-Capillary: 149 mg/dL — ABNORMAL HIGH (ref 70–99)

## 2015-01-26 MED ORDER — BUMETANIDE 1 MG PO TABS: 1.0000 mg | ORAL_TABLET | Freq: Two times a day (BID) | ORAL | Status: DC

## 2015-01-26 MED ORDER — POTASSIUM CHLORIDE ER 10 MEQ PO TBCR: 20.0000 meq | EXTENDED_RELEASE_TABLET | Freq: Every day | ORAL | Status: DC

## 2015-01-26 MED ORDER — CARVEDILOL 3.125 MG PO TABS: 3.1250 mg | ORAL_TABLET | Freq: Two times a day (BID) | ORAL | Status: DC

## 2015-01-26 MED ORDER — DOXYCYCLINE HYCLATE 100 MG PO CAPS: 100.0000 mg | ORAL_CAPSULE | Freq: Two times a day (BID) | ORAL | Status: DC

## 2015-01-26 NOTE — Progress Notes (Signed)
Inpatient Diabetes Program Recommendations  AACE/ADA: New Consensus Statement on Inpatient Glycemic Control (2013)  Target Ranges:  Prepandial:   less than 140 mg/dL      Peak postprandial:   less than 180 mg/dL (1-2 hours)      Critically ill patients:  140 - 180 mg/dL   Reason for Visit: Acute Bronchitis  Diabetes history: DM 2 Outpatient Diabetes medications: Novolog 10 units TID meal coverage, Lantus 25 units QHS Current orders for Inpatient glycemic control: Novolog 10 units TID meal coverage, Lantus 25 units QHS  Inpatient Diabetes Program Recommendations  Insulin - Basal: Due to renal function and hypoglycemia, please consider decreasing basal insulin to Lantus 20 units QHS.  Thanks,  Tama Headings RN, MSN, [facility] Inpatient Diabetes Coordinator Team Pager [phone]

## 2015-01-26 NOTE — Progress Notes (Signed)
Reviewed discharge paperwork with pt and provided prescription for antibiotics.  Pt denied any needs at this time.  Called [place] (Son) to let him know the pt was being discharged.  Will continue to monitor while pt waits for ride.

## 2015-01-26 NOTE — Progress Notes (Signed)
Rechecked CBG at 2317 and blood sugar was 107. Will continue to monitor patient.

## 2015-01-26 NOTE — Discharge Summary (Addendum)
Tony Freeman, is a 64 y.o. male  DOB 09-25-1951  MRN [id number].  Admission date:  01/24/2015  Admitting Physician  Verlee Monte, MD  Discharge Date:  01/26/2015   Primary MD  Tanya Nones, MD  Recommendations for primary care physician for things to follow:   Repeat BMP in 3-4 days, monitor weight and diuretic dose closely.   Admission Diagnosis  Hypercalcemia [E83.52] Weak [R53.1]   Discharge Diagnosis  Hypercalcemia [E83.52] Weak [R53.1]     Principal Problem:   Acute bronchitis Active Problems:   Chronic kidney disease (CKD), stage IV (severe)   DM (diabetes mellitus), type 2, uncontrolled, with renal complications   Essential hypertension, benign   Chronic combined systolic and diastolic CHF (congestive heart failure)   PAF (paroxysmal atrial fibrillation)   Hypercalcemia      Past Medical History  Diagnosis Date  . Diabetes mellitus with nephropathy   . Systolic and diastolic CHF, chronic     a. Echo at [facility] 11/17/2013 Ef <25%, severe hypokinesis of LV, moderately dilated LA, grade IV/IV diastolic dysfunction with irreversible restrictive pattern of mitral inflow, severe pulm HTN (RVSP >28mmHg), 1-2+ MR  b. Echo at [facility] 04/02/14 EF 25-30%, moderate concentric LVH, diffuse hypokinesis, moderately dilated LA, no significant MR observed  . CAD (coronary artery disease)     a. PCI LAD/RCA in 2007  b. PCI LAD/RCA in 2009 at [facility]  c. PCI LAD 08/2013, CTO of RCA and nonobstructive disease in the LCx.  Marland Kitchen Hyperlipidemia   . Stroke   . Dementia   . Ischemic cardiomyopathy     a. s/p Biotronik ICD 06/2014.  Marland Kitchen Retinopathy     bilateral  . CKD (chronic kidney disease), stage IV   . HTN (hypertension)   . Abnormal renal ultrasound     a. 03/2014: Abnormal renal ultrasound with left  renal lesion and bladder wall thickening and microscopic hematuria. Multiple cysts on prior MRI. Given inability to use contrast, f/u imaging limited, repeat US 3 months with outpatient uro f/u.  Marland Kitchen Paroxysmal atrial fibrillation   . Paroxysmal atrial flutter     Past Surgical History  Procedure Laterality Date  . Coronary stent placement      Multivessel coronary intervention in 2007 and 2009. Ischemic cardiomyopathyPCI LAD/ RCA in 2007 by Dr. Dwyane Dee. PCI LAD/RCA in 2009 at [facility]; PCI LAD 08/2013. Presented with unstable angina pectoris.. Coronary angio demonstrated proximal and distal LAD lesions.33/20 PROMUS stent in proximal LAD// PTCA of distal LAD  . Cardiac defibrillator placement    . Spine surgery      anterior fusion  . Hernia repair    . Tee without cardioversion N/A 07/26/2014    Procedure: TRANSESOPHAGEAL ECHOCARDIOGRAM (TEE);  Surgeon: Larey Dresser, MD;  Location: [place];  Service: Cardiovascular;  Laterality: N/A;  . Cardioversion N/A 07/26/2014    Procedure: CARDIOVERSION;  Surgeon: Larey Dresser, MD;  Location: [place];  Service: Cardiovascular;  Laterality: N/A;  . Right heart catheterization N/A 07/25/2014  Procedure: RIGHT HEART CATH;  Surgeon: Jolaine Artist, MD;  Location: [facility] CATH LAB;  Service: Cardiovascular;  Laterality: N/A;  . Cardiac defibrillator placement  06/2014       History of present illness and  Hospital Course:     Kindly see H&P for history of present illness and admission details, please review complete Labs, Consult reports and Test reports for all details in brief  HPI  from the history and physical done on the day of admission  Tony Freeman is a 64 y.o. male with past medical history of diabetes mellitus type 2, chronic combined CHF and stage IV CKD, patient also has A. fib and pacemaker. Patient came into the hospital complaining about shortness of breath. Patient's symptoms started several days ago, has  shortness of breath, subjective fevers, cough with light greenish sputum. His daughter low was recently getting over the flu but he never had significant fever. In the ER CXR showed no evidence of pneumonia but patient has significant rhonchi admitted to the hospital for further evaluation.   Hospital Course    1. Acute bronchitis with mild asthma flare. Much improved today after empiric IV Rocephin and azithromycin, which will be continued, not require any steroids, no oxygen requirement completely symptom-free now. Will place on 5 more days of oral doxycycline and discharged home.    2. Acute renal failure on chronic kidney disease stage V. A slight creatinine of 4, clearly was dehydrated upon admission was not eating or drinking well for the last 2 days, diuretics were held, he was hydrated, renal function has improved considerably, still not at baseline and has been advised to hold his potassium and diuretic for 2 more days, sun bedside has also been told the same. Will follow with PCP to get BMP rechecked in 3-4 days.    3. Hypercalcemia. Due to dehydration. Resolved after hydration which will be continued gently. Hold diuretics.    4. Paroxysmal atrial fibrillation with chads 2 Vasc score of 3 - continue amiodarone home dose beta blocker and oral anticoagulation.    5. Chronic Combined -diastolic and systolic heart failure. EF around 20-25%. Her energy dehydrated, diuretic on hold, being hydrated, in no ACE/ARB due to renal failure. Resting heart rate in low 50s, have reduced home dose beta blocker to half, follow with cardiology within 1-2 weeks.    Discharge Condition: Stable   Follow UP  Follow-up Information    Follow up with PERROTT, Renato Gails, MD. Schedule an appointment as soon as possible for a visit in 3 days.   Specialty:  Family Medicine   Why:  anf your Heart doctor   Contact information:   [place] [address] [phone]       Follow up  with [name], MD. Schedule an appointment as soon as possible for a visit in 1 week.   Specialty:  Cardiology   Contact information:   [address] [place] [address] [phone]         Discharge Instructions  and  Discharge Medications          Discharge Instructions    Discharge instructions    Complete by:  As directed   Follow with Primary MD PERROTT, Renato Gails, MD in 7 days   Get CBC, CMP, 2 view Chest X ray checked  by Primary MD next visit.    Activity: As tolerated with Full fall precautions use walker/cane & assistance as needed  Disposition Home     Diet: Heart Healthy  Check your Weight same time everyday, if you gain over 2 pounds, or you develop in leg swelling, experience more shortness of breath or chest pain, call your Primary MD immediately. Follow Cardiac Low Salt Diet and 1.5 lit/day fluid restriction.   On your next visit with your primary care physician please Get Medicines reviewed and adjusted.   Please request your Prim.MD to go over all Hospital Tests and Procedure/Radiological results at the follow up, please get all Hospital records sent to your Prim MD by signing hospital release before you go home.   If you experience worsening of your admission symptoms, develop shortness of breath, life threatening emergency, suicidal or homicidal thoughts you must seek medical attention immediately by calling 911 or calling your MD immediately  if symptoms less severe.  You Must read complete instructions/literature along with all the possible adverse reactions/side effects for all the Medicines you take and that have been prescribed to you. Take any new Medicines after you have completely understood and accpet all the possible adverse reactions/side effects.   Do not drive, operating heavy machinery, perform activities at heights, swimming or participation in water activities or provide baby sitting services if your were admitted  for syncope or siezures until you have seen by Primary MD or a Neurologist and advised to do so again.  Do not drive when taking Pain medications.    Do not take more than prescribed Pain, Sleep and Anxiety Medications  Special Instructions: If you have smoked or chewed Tobacco  in the last 2 yrs please stop smoking, stop any regular Alcohol  and or any Recreational drug use.  Wear Seat belts while driving.   Please note  You were cared for by a hospitalist during your hospital stay. If you have any questions about your discharge medications or the care you received while you were in the hospital after you are discharged, you can call the unit and asked to speak with the hospitalist on call if the hospitalist that took care of you is not available. Once you are discharged, your primary care physician will handle any further medical issues. Please note that NO REFILLS for any discharge medications will be authorized once you are discharged, as it is imperative that you return to your primary care physician (or establish a relationship with a primary care physician if you do not have one) for your aftercare needs so that they can reassess your need for medications and monitor your lab values.     Increase activity slowly    Complete by:  As directed             Medication List    TAKE these medications        albuterol 108 (90 BASE) MCG/ACT inhaler  Commonly known as:  PROVENTIL HFA;VENTOLIN HFA  Inhale 2 puffs into the lungs every 6 (six) hours as needed for wheezing or shortness of breath.     albuterol (2.5 MG/3ML) 0.083% nebulizer solution  Commonly known as:  PROVENTIL  Take 2.5 mg by nebulization every 6 (six) hours as needed for wheezing or shortness of breath.     amiodarone 200 MG tablet  Commonly known as:  PACERONE  Take 100 mg by mouth daily.     atorvastatin 40 MG tablet  Commonly known as:  LIPITOR  Take 1 tablet (40 mg total) by mouth daily.     bumetanide 1 MG  tablet  Commonly  known as:  BUMEX  Take 1 tablet (1 mg total) by mouth 2 (two) times daily.  Start taking on:  01/28/2015     carvedilol 3.125 MG tablet  Commonly known as:  COREG  Take 1 tablet (3.125 mg total) by mouth 2 (two) times daily with a meal.     cholecalciferol 1000 UNITS tablet  Commonly known as:  VITAMIN D  Take 2,000 Units by mouth daily.     doxycycline 100 MG capsule  Commonly known as:  VIBRAMYCIN  Take 1 capsule (100 mg total) by mouth 2 (two) times daily.     DULERA 200-5 MCG/ACT Aero  Generic drug:  mometasone-formoterol  Inhale 2 puffs into the lungs 2 (two) times daily.     edoxaban 30 MG Tabs tablet  Commonly known as:  SAVAYSA  Take 1 tablet (30 mg total) by mouth daily.     ferrous sulfate 325 (65 FE) MG tablet  Take 1 tablet (325 mg total) by mouth 2 (two) times daily with a meal.     hydrALAZINE 50 MG tablet  Commonly known as:  APRESOLINE  Take 1.5 tablets (75 mg total) by mouth 3 (three) times daily.     insulin aspart 100 UNIT/ML injection  Commonly known as:  novoLOG  Inject 10 Units into the skin 3 (three) times daily with meals.     insulin glargine 100 UNIT/ML injection  Commonly known as:  LANTUS  Inject 0.25 mLs (25 Units total) into the skin at bedtime.     isosorbide mononitrate 60 MG 24 hr tablet  Commonly known as:  IMDUR  Take 60 mg by mouth daily.     mirtazapine 7.5 MG tablet  Commonly known as:  REMERON  Take 7.5 mg by mouth.     multivitamin with minerals Tabs tablet  Take 1 tablet by mouth daily.     potassium chloride 10 MEQ tablet  Commonly known as:  K-DUR  Take 2 tablets (20 mEq total) by mouth daily.  Start taking on:  01/28/2015          Diet and Activity recommendation: See Discharge Instructions above   Consults obtained - none   Major procedures and Radiology Reports - PLEASE review detailed and final reports for all details, in brief -       Dg Chest 2 View  01/24/2015   CLINICAL DATA:   Weakness  EXAM: CHEST  2 VIEW  COMPARISON:  07/16/2014  FINDINGS: Cardiac enlargement. Negative for heart failure. Coronary artery stent. AICD unchanged in position.  Small right pleural effusion. Negative for edema. No effusion on the left. Negative for pneumonia.  IMPRESSION: Cardiac enlargement with small right pleural effusion. Negative for pulmonary edema.   Electronically Signed   By: Franchot Gallo M.D.   On: 01/24/2015 10:35    Micro Results      No results found for this or any previous visit (from the past 240 hour(s)).     Today   Subjective:   Tony Freeman today has no headache,no chest abdominal pain,no new weakness tingling or numbness, feels much better wants to go home today.    Objective:   Blood pressure 131/58, pulse 52, temperature 97.6 F (36.4 C), temperature source Oral, resp. rate 18, height 5\' 6"  (1.676 m), weight 78 kg (171 lb 15.3 oz), SpO2 98 %.   Intake/Output Summary (Last 24 hours) at 01/26/15 1036 Last data filed at 01/26/15 0711  Gross per 24 hour  Intake  2985 ml  Output    900 ml  Net   2085 ml    Exam Awake Alert, Oriented x 3, No new F.N deficits, Normal affect [place].AT,PERRAL Supple Neck,No JVD, No cervical lymphadenopathy appriciated.  Symmetrical Chest wall movement, Good air movement bilaterally, CTAB RRR,No Gallops,Rubs or new Murmurs, No Parasternal Heave +ve B.Sounds, Abd Soft, Non tender, No organomegaly appriciated, No rebound -guarding or rigidity. No Cyanosis, Clubbing or edema, No new Rash or bruise  Data Review   CBC w Diff:  Lab Results  Component Value Date   WBC 4.5 01/26/2015   WBC 5.2 01/16/2015   HGB 8.2* 01/26/2015   HGB 8.9* 01/16/2015   HCT 25.7* 01/26/2015   HCT 28.0* 01/16/2015   PLT 133* 01/26/2015   PLT 160 Platelet count confirmed by slide estimate 01/16/2015   LYMPHOPCT 19 01/24/2015   LYMPHOPCT 13.0* 01/16/2015   MONOPCT 10 01/24/2015   MONOPCT 14.9* 01/16/2015   EOSPCT 3 01/24/2015   EOSPCT  4.1 01/16/2015   BASOPCT 0 01/24/2015   BASOPCT 0.4 01/16/2015    CMP:  Lab Results  Component Value Date   NA 137 01/26/2015   K 4.1 01/26/2015   CL 105 01/26/2015   CO2 25 01/26/2015   BUN 80* 01/26/2015   CREATININE 5.19* 01/26/2015   PROT 6.6 01/26/2015   ALBUMIN 3.1* 01/26/2015   BILITOT 1.3* 01/26/2015   ALKPHOS 169* 01/26/2015   AST 36 01/26/2015   ALT 20 01/26/2015  .   Total Time in preparing paper work, data evaluation and todays exam - 35 minutes  Thurnell Lose M.D on 01/26/2015 at 10:36 AM  [facility]   Office  [phone]

## 2015-01-26 NOTE — Discharge Instructions (Signed)
Follow with Primary MD Tanya Nones, MD in 7 days   Get CBC, CMP, 2 view Chest X ray checked  by Primary MD next visit.    Activity: As tolerated with Full fall precautions use walker/cane & assistance as needed   Disposition Home     Diet: Heart Healthy  Check your Weight same time everyday, if you gain over 2 pounds, or you develop in leg swelling, experience more shortness of breath or chest pain, call your Primary MD immediately. Follow Cardiac Low Salt Diet and 1.5 lit/day fluid restriction.   On your next visit with your primary care physician please Get Medicines reviewed and adjusted.   Please request your Prim.MD to go over all Hospital Tests and Procedure/Radiological results at the follow up, please get all Hospital records sent to your Prim MD by signing hospital release before you go home.   If you experience worsening of your admission symptoms, develop shortness of breath, life threatening emergency, suicidal or homicidal thoughts you must seek medical attention immediately by calling 911 or calling your MD immediately  if symptoms less severe.  You Must read complete instructions/literature along with all the possible adverse reactions/side effects for all the Medicines you take and that have been prescribed to you. Take any new Medicines after you have completely understood and accpet all the possible adverse reactions/side effects.   Do not drive, operating heavy machinery, perform activities at heights, swimming or participation in water activities or provide baby sitting services if your were admitted for syncope or siezures until you have seen by Primary MD or a Neurologist and advised to do so again.  Do not drive when taking Pain medications.    Do not take more than prescribed Pain, Sleep and Anxiety Medications  Special Instructions: If you have smoked or chewed Tobacco  in the last 2 yrs please stop smoking, stop any regular Alcohol  and or any  Recreational drug use.  Wear Seat belts while driving.   Please note  You were cared for by a hospitalist during your hospital stay. If you have any questions about your discharge medications or the care you received while you were in the hospital after you are discharged, you can call the unit and asked to speak with the hospitalist on call if the hospitalist that took care of you is not available. Once you are discharged, your primary care physician will handle any further medical issues. Please note that NO REFILLS for any discharge medications will be authorized once you are discharged, as it is imperative that you return to your primary care physician (or establish a relationship with a primary care physician if you do not have one) for your aftercare needs so that they can reassess your need for medications and monitor your lab values.

## 2015-01-26 NOTE — Progress Notes (Signed)
Reviewed discharge paperwork with pt's son.  Denied any other needs at this time.  Pt taken to discharge location via wheelchair.

## 2015-01-29 ENCOUNTER — Ambulatory Visit (HOSPITAL_COMMUNITY): Payer: Medicare HMO

## 2015-01-30 ENCOUNTER — Other Ambulatory Visit: Payer: Self-pay | Admitting: Radiology

## 2015-01-31 ENCOUNTER — Inpatient Hospital Stay (HOSPITAL_COMMUNITY): Admission: RE | Admit: 2015-01-31 | Payer: Medicare HMO | Source: Ambulatory Visit

## 2015-01-31 ENCOUNTER — Ambulatory Visit (HOSPITAL_COMMUNITY)
Admission: RE | Admit: 2015-01-31 | Discharge: 2015-01-31 | Disposition: A | Discharge status: Home / Self Care | Payer: Medicare HMO | Source: Ambulatory Visit | Attending: Hematology | Admitting: Hematology

## 2015-01-31 ENCOUNTER — Encounter (HOSPITAL_COMMUNITY): Payer: Self-pay

## 2015-01-31 DIAGNOSIS — D649 Anemia, unspecified: Secondary | ICD-10-CM

## 2015-01-31 DIAGNOSIS — E1121 Type 2 diabetes mellitus with diabetic nephropathy: Secondary | ICD-10-CM | POA: Diagnosis not present

## 2015-01-31 DIAGNOSIS — E785 Hyperlipidemia, unspecified: Secondary | ICD-10-CM | POA: Insufficient documentation

## 2015-01-31 DIAGNOSIS — I129 Hypertensive chronic kidney disease with stage 1 through stage 4 chronic kidney disease, or unspecified chronic kidney disease: Secondary | ICD-10-CM | POA: Insufficient documentation

## 2015-01-31 DIAGNOSIS — I480 Paroxysmal atrial fibrillation: Secondary | ICD-10-CM | POA: Insufficient documentation

## 2015-01-31 DIAGNOSIS — I255 Ischemic cardiomyopathy: Secondary | ICD-10-CM | POA: Diagnosis not present

## 2015-01-31 DIAGNOSIS — F0390 Unspecified dementia without behavioral disturbance: Secondary | ICD-10-CM | POA: Diagnosis not present

## 2015-01-31 DIAGNOSIS — N184 Chronic kidney disease, stage 4 (severe): Secondary | ICD-10-CM | POA: Diagnosis not present

## 2015-01-31 DIAGNOSIS — Z794 Long term (current) use of insulin: Secondary | ICD-10-CM | POA: Diagnosis not present

## 2015-01-31 DIAGNOSIS — D72810 Lymphocytopenia: Secondary | ICD-10-CM | POA: Diagnosis not present

## 2015-01-31 DIAGNOSIS — I5042 Chronic combined systolic (congestive) and diastolic (congestive) heart failure: Secondary | ICD-10-CM | POA: Diagnosis not present

## 2015-01-31 DIAGNOSIS — Z79899 Other long term (current) drug therapy: Secondary | ICD-10-CM | POA: Insufficient documentation

## 2015-01-31 DIAGNOSIS — I2510 Atherosclerotic heart disease of native coronary artery without angina pectoris: Secondary | ICD-10-CM | POA: Diagnosis not present

## 2015-01-31 LAB — BASIC METABOLIC PANEL
ANION GAP: 10 (ref 5–15)
BUN: 62 mg/dL — ABNORMAL HIGH (ref 6–23)
CHLORIDE: 102 mmol/L (ref 96–112)
CO2: 29 mmol/L (ref 19–32)
CREATININE: 4.62 mg/dL — AB (ref 0.50–1.35)
Calcium: 11.8 mg/dL — ABNORMAL HIGH (ref 8.4–10.5)
GFR calc Af Amer: 14 mL/min — ABNORMAL LOW (ref >90)
GFR calc non Af Amer: 12 mL/min — ABNORMAL LOW (ref >90)
GLUCOSE: 63 mg/dL — AB (ref 70–99)
Potassium: 3.8 mmol/L (ref 3.5–5.1)
Sodium: 141 mmol/L (ref 135–145)

## 2015-01-31 LAB — CBC WITH DIFFERENTIAL/PLATELET
Basophils Absolute: 0 10*3/uL (ref 0.0–0.1)
Basophils Relative: 0 % (ref 0–1)
EOS PCT: 3 % (ref 0–5)
Eosinophils Absolute: 0.2 10*3/uL (ref 0.0–0.7)
HCT: 28.8 % — ABNORMAL LOW (ref 39.0–52.0)
Hemoglobin: 9 g/dL — ABNORMAL LOW (ref 13.0–17.0)
Lymphocytes Relative: 12 % (ref 12–46)
Lymphs Abs: 0.7 10*3/uL (ref 0.7–4.0)
MCH: 30.2 pg (ref 26.0–34.0)
MCHC: 31.3 g/dL (ref 30.0–36.0)
MCV: 96.6 fL (ref 78.0–100.0)
MONO ABS: 1 10*3/uL (ref 0.1–1.0)
MONOS PCT: 17 % — AB (ref 3–12)
NEUTROS ABS: 4.2 10*3/uL (ref 1.7–7.7)
Neutrophils Relative %: 68 % (ref 43–77)
Platelets: 188 10*3/uL (ref 150–400)
RBC: 2.98 MIL/uL — ABNORMAL LOW (ref 4.22–5.81)
RDW: 15.6 % — AB (ref 11.5–15.5)
WBC: 6.2 10*3/uL (ref 4.0–10.5)

## 2015-01-31 LAB — GLUCOSE, CAPILLARY
GLUCOSE-CAPILLARY: 68 mg/dL — AB (ref 70–99)
GLUCOSE-CAPILLARY: 85 mg/dL (ref 70–99)
GLUCOSE-CAPILLARY: 61 mg/dL — AB (ref 70–99)
Glucose-Capillary: 47 mg/dL — ABNORMAL LOW (ref 70–99)

## 2015-01-31 LAB — PROTIME-INR
INR: 1.65 — AB (ref 0.00–1.49)
Prothrombin Time: 19.7 seconds — ABNORMAL HIGH (ref 11.6–15.2)

## 2015-01-31 LAB — BONE MARROW EXAM

## 2015-01-31 MED ORDER — HYDROCODONE-ACETAMINOPHEN 5-325 MG PO TABS: 1.0000 | ORAL_TABLET | ORAL | Status: DC | PRN

## 2015-01-31 MED ORDER — FENTANYL CITRATE 0.05 MG/ML IJ SOLN
INTRAMUSCULAR | Status: AC
  Filled 2015-01-31: qty 4

## 2015-01-31 MED ORDER — DEXTROSE-NACL 5-0.9 % IV SOLN
INTRAVENOUS | Status: DC
  Administered 2015-01-31: 11:00:00 via INTRAVENOUS

## 2015-01-31 MED ORDER — MIDAZOLAM HCL 2 MG/2ML IJ SOLN
INTRAMUSCULAR | Status: AC
  Filled 2015-01-31: qty 6

## 2015-01-31 MED ORDER — FENTANYL CITRATE 0.05 MG/ML IJ SOLN
INTRAMUSCULAR | Status: AC | PRN
  Administered 2015-01-31: 25 ug via INTRAVENOUS
  Administered 2015-01-31: 50 ug via INTRAVENOUS

## 2015-01-31 MED ORDER — MIDAZOLAM HCL 2 MG/2ML IJ SOLN
INTRAMUSCULAR | Status: AC | PRN
  Administered 2015-01-31 (×2): 1 mg via INTRAVENOUS

## 2015-01-31 MED ORDER — SODIUM CHLORIDE 0.9 % IV SOLN
INTRAVENOUS | Status: DC
  Administered 2015-01-31: 09:00:00 via INTRAVENOUS

## 2015-01-31 MED ORDER — DEXTROSE 50 % IV SOLN
25.0000 mL | INTRAVENOUS | Status: AC
  Administered 2015-01-31: 25 mL via INTRAVENOUS
  Filled 2015-01-31: qty 50

## 2015-01-31 NOTE — H&P (Signed)
Chief Complaint: "I'm having a bone marrow biopsy"  Referring Physician(s): Feng,Yan  History of Present Illness: Tony Freeman is a 64 y.o. male recently admitted with history of acute bronchitis with mild asthma flare and subsequently discharged on 01/26/15. Past medical history is also significant for diabetes, HTN, CHF,CKD, coronary artery disease with ICD placement, paroxysmal A. fib , stroke, dementia, ischemic cardiomyopathy and persistent normocytic hypoproductive anemia. He presents today for CT-guided bone marrow biopsy for further evaluation.  Past Medical History  Diagnosis Date  . Diabetes mellitus with nephropathy   . Systolic and diastolic CHF, chronic     a. Echo at [facility] 11/17/2013 Ef <25%, severe hypokinesis of LV, moderately dilated LA, grade IV/IV diastolic dysfunction with irreversible restrictive pattern of mitral inflow, severe pulm HTN (RVSP >74mHg), 1-2+ MR  [facility] EF 25-30%, moderate concentric LVH, diffuse hypokinesis, moderately dilated LA, no significant MR observed  . CAD (coronary artery disease)     a. PCI LAD/RCA in 2007  [facility] 08/2013, CTO of RCA and nonobstructive disease in the LCx.  .Marland KitchenHyperlipidemia   . Stroke   . Dementia   . Ischemic cardiomyopathy     a. s/p Biotronik ICD 06/2014.  .Marland KitchenRetinopathy     bilateral  . CKD (chronic kidney disease), stage IV   . HTN (hypertension)   . Abnormal renal ultrasound     a. 03/2014: Abnormal renal ultrasound with left renal lesion and bladder wall thickening and microscopic hematuria. Multiple cysts on prior MRI. Given inability to use contrast, f/u imaging limited, repeat [place] with outpatient uro f/u.  .Marland KitchenParoxysmal atrial fibrillation   . Paroxysmal atrial flutter     Past Surgical History  Procedure Laterality Date  . Coronary stent placement      Multivessel coronary intervention in 2007 and 2009. Ischemic cardiomyopathyPCI  LAD/ RCA in 2007 by Dr. KDwyane Dee [facility]2013. Presented with unstable angina pectoris.. Coronary angio demonstrated proximal and distal LAD lesions.756/20PROMUS stent in proximal LAD// PTCA of distal LAD  . Cardiac defibrillator placement    . Spine surgery      anterior fusion  . Hernia repair    . Tee without cardioversion N/A 07/26/2014    Procedure: TRANSESOPHAGEAL ECHOCARDIOGRAM (TEE);  Surgeon: DLarey Dresser MD;  [facility]  Service: Cardiovascular;  Laterality: N/A;  . Cardioversion N/A 07/26/2014    Procedure: CARDIOVERSION;  Surgeon: DLarey Dresser MD;  [place] [facility]  Service: Cardiovascular;  Laterality: N/A;  . Right heart catheterization N/A 07/25/2014    Procedure: RIGHT HEART CATH;  Surgeon: DJolaine Artist MD;  [facility];  Service: Cardiovascular;  Laterality: N/A;  . Cardiac defibrillator placement  06/2014    Allergies: Review of patient's allergies indicates no known allergies.  Medications: Prior to Admission medications   Medication Sig Start Date End Date Taking? Authorizing Provider  albuterol (PROVENTIL HFA;VENTOLIN HFA) 108 (90 BASE) MCG/ACT inhaler Inhale 2 puffs into the lungs every 6 (six) hours as needed for wheezing or shortness of breath.   Yes Historical Provider, MD  albuterol (PROVENTIL) (2.5 MG/3ML) 0.083% nebulizer solution Take 2.5 mg by nebulization every 6 (six) hours as needed for wheezing or shortness of breath.   Yes Historical Provider, MD  amiodarone (PACERONE) 200 MG tablet Take 100 mg by mouth daily.   Yes Historical  Provider, MD  atorvastatin (LIPITOR) 40 MG tablet Take 1 tablet (40 mg total) by mouth daily. 04/08/14  Yes Jessica U Vann, DO  bumetanide (BUMEX) 1 MG tablet Take 1 tablet (1 mg total) by mouth 2 (two) times daily. 01/28/15  Yes Thurnell Lose, MD  carvedilol (COREG) 3.125 MG tablet Take 1 tablet (3.125 mg total) by mouth 2 (two) times daily with a meal.  01/26/15  Yes Thurnell Lose, MD  cholecalciferol (VITAMIN D) 1000 UNITS tablet Take 2,000 Units by mouth daily.   Yes Historical Provider, MD  doxycycline (VIBRAMYCIN) 100 MG capsule Take 1 capsule (100 mg total) by mouth 2 (two) times daily. 01/26/15  Yes Thurnell Lose, MD  edoxaban (SAVAYSA) 30 MG TABS tablet Take 1 tablet (30 mg total) by mouth daily. 07/31/14  Yes Tasrif Ahmed, MD  ferrous sulfate 325 (65 FE) MG tablet Take 1 tablet (325 mg total) by mouth 2 (two) times daily with a meal. 07/31/14  Yes Tasrif Ahmed, MD  hydrALAZINE (APRESOLINE) 50 MG tablet Take 1.5 tablets (75 mg total) by mouth 3 (three) times daily. 01/10/15  Yes Larey Dresser, MD  insulin aspart (NOVOLOG) 100 UNIT/ML injection Inject 10 Units into the skin 3 (three) times daily with meals. 07/31/14  Yes Tasrif Ahmed, MD  insulin glargine (LANTUS) 100 UNIT/ML injection Inject 0.25 mLs (25 Units total) into the skin at bedtime. 07/31/14  Yes Tasrif Ahmed, MD  isosorbide mononitrate (IMDUR) 60 MG 24 hr tablet Take 60 mg by mouth daily. 06/23/14 06/23/15 Yes Historical Provider, MD  mirtazapine (REMERON) 7.5 MG tablet Take 7.5 mg by mouth. 01/05/15 02/04/15 Yes Historical Provider, MD  mometasone-formoterol (DULERA) 200-5 MCG/ACT AERO Inhale 2 puffs into the lungs 2 (two) times daily.   Yes Historical Provider, MD  Multiple Vitamin (MULTIVITAMIN WITH MINERALS) TABS tablet Take 1 tablet by mouth daily.   Yes Historical Provider, MD  potassium chloride (K-DUR) 10 MEQ tablet Take 2 tablets (20 mEq total) by mouth daily. 01/28/15  Yes Thurnell Lose, MD    Family History  Problem Relation Age of Onset  . Heart disease      brother  . Diabetes Mother   . Cancer Father     brother  . Kidney disease      History   Social History  . Marital Status: Single    Spouse Name: N/A  . Number of Children: N/A  . Years of Education: N/A   Social History Main Topics  . Smoking status: Never Smoker   . Smokeless tobacco: Not on file    . Alcohol Use: No  . Drug Use: No  . Sexual Activity: Not on file   Other Topics Concern  . None   Social History Narrative   Going to be living with his son.  He lives with his daughter currently.  2 children.  Disability since CVA      Review of Systems  Constitutional: Positive for fatigue and unexpected weight change. Negative for fever.  Respiratory: Positive for cough and shortness of breath.   Cardiovascular: Positive for leg swelling. Negative for chest pain.  Gastrointestinal: Negative for nausea, vomiting, abdominal pain and blood in stool.  Genitourinary: Negative for dysuria and hematuria.  Musculoskeletal:       Occ back pain  Neurological: Negative for headaches.  Hematological: Does not bruise/bleed easily.  Psychiatric/Behavioral:       Hx dementia    Vital Signs: Blood pressure 116/89, temperature 98.2, heart rate  60, respirations 16, oxygen saturation is 97% room air  Physical Exam  Constitutional: He appears well-developed and well-nourished.  Cardiovascular: Normal rate and regular rhythm.   Left upper chest ICD in place  Pulmonary/Chest: Effort normal.  few scatt wheezes, sl dim BS right  base  Abdominal: Soft. Bowel sounds are normal. There is no tenderness.  Musculoskeletal: He exhibits edema.  Neurological: He is alert.  Answers questions appropriately    Imaging: Dg Chest 2 View  01/24/2015   CLINICAL DATA:  Weakness  EXAM: CHEST  2 VIEW  COMPARISON:  07/16/2014  FINDINGS: Cardiac enlargement. Negative for heart failure. Coronary artery stent. AICD unchanged in position.  Small right pleural effusion. Negative for edema. No effusion on the left. Negative for pneumonia.  IMPRESSION: Cardiac enlargement with small right pleural effusion. Negative for pulmonary edema.   Electronically Signed   By: Franchot Gallo M.D.   On: 01/24/2015 10:35   US Abdomen Complete  01/31/2015   CLINICAL DATA:  Anemia, evaluate liver and spleen  EXAM: ULTRASOUND  ABDOMEN COMPLETE  COMPARISON:  10/17/2014  FINDINGS: Gallbladder: Cholelithiasis with gallbladder sludge. Mild gallbladder wall thickening, favored to reflect underdistention. Negative sonographic Murphy's sign.  Common bile duct: Diameter: 2 mm  Liver: No focal lesion identified. Within normal limits in parenchymal echogenicity.  IVC: No abnormality visualized.  Pancreas: Visualized portion unremarkable.  Spleen: Size and appearance within normal limits.  Right Kidney: Length: 11.5 cm. 3.3 x 2.8 x 3.7 cm upper pole renal cyst. 2.5 x 2.6 x 2.2 cm lower pole renal cyst. No hydronephrosis.  Left Kidney: Length: 10.0 cm. 1.8 x 2.1 x 1.6 cm interpolar renal cyst. No hydronephrosis.  Abdominal aorta: No aneurysm visualized.  Other findings: Ascites.  Right pleural effusion.  IMPRESSION: Spleen is normal in size.  No focal hepatic lesion is seen.  Cholelithiasis, without associated sonographic findings suggest acute cholecystitis.  Bilateral renal cysts.  Ascites.  Right pleural effusion.   Electronically Signed   By: Julian Hy M.D.   On: 01/31/2015 08:53    Labs:  CBC:  Recent Labs  01/24/15 0946 01/25/15 0540 01/26/15 0603 01/31/15 0900  WBC 3.9* 4.4 4.5 6.2  HGB 8.9* 8.2* 8.2* 9.0*  HCT 28.2* 25.5* 25.7* 28.8*  PLT 140* 134* 133* 188    COAGS:  Recent Labs  07/26/14 1832 01/24/15 1446 01/26/15 0603 01/31/15 0900  INR 2.09* 2.57* 1.49 1.65*    BMP:  Recent Labs  08/07/14 0847 01/24/15 0946 01/25/15 0540 01/26/15 0603  NA 137 136 136 137  K 4.3 3.8 3.5 4.1  CL 95* 100 102 105  CO2 _0 GLUCOSE 217* 199* 148* 53*  BUN 79* 82* 81* 80*  CALCIUM 9.8 11.4* 10.2 10.1  CREATININE 3.96* 5.78* 5.51* 5.19*  GFRNONAA 15* 9* 10* 11*  GFRAA 17* 11* 12* 12*    LIVER FUNCTION TESTS:  Recent Labs  07/17/14 0350 11/24/14 1022 01/24/15 0946 01/26/15 0603  BILITOT 1.4* 1.8* 1.5* 1.3*  AST 28 34 36 36  ALT _1 ALKPHOS 108 187* 189* 169*  PROT 6.6 7.1 7.6  6.6  ALBUMIN 3.0* 3.4* 3.6 3.1*    TUMOR MARKERS: No results for input(s): AFPTM, CEA, CA199, CHROMGRNA in the last 8760 hours.  Assessment and Plan: Tony Freeman is a 64 y.o. male recently admitted with history of acute bronchitis with mild asthma flare and subsequently discharged on 01/26/15. Past medical history is also significant for diabetes, HTN,  CHF, CKD, coronary artery disease with ICD placement, paroxysmal A. fib , stroke, dementia, ischemic cardiomyopathy and persistent normocytic hypoproductive anemia. He presents today for CT-guided bone marrow biopsy for further evaluation.Risks and benefits discussed with the patient/family including, but not limited to bleeding, infection, damage to adjacent structures or low yield requiring additional tests. All of the patient's questions were answered, patient is agreeable to proceed. Consent signed and in chart.      Signed: Autumn Messing 01/31/2015, 9:48 AM   I spent a total of 20 minutes face to face in clinical consultation, greater than 50% of which was counseling/coordinating care for CT-guided bone marrow biopsy

## 2015-01-31 NOTE — Progress Notes (Signed)
Patient's CBG is 61. He is eating peanut butter and crackers.

## 2015-01-31 NOTE — Discharge Instructions (Signed)
Bone Marrow Aspiration, Bone Marrow Biopsy Care After Read the instructions outlined below and refer to this sheet in the next few weeks. These discharge instructions provide you with general information on caring for yourself after you leave the hospital. Your caregiver may also give you specific instructions. While your treatment has been planned according to the most current medical practices available, unavoidable complications occasionally occur. If you have any problems or questions after discharge, call your caregiver. FINDING OUT THE RESULTS OF YOUR TEST Not all test results are available during your visit. If your test results are not back during the visit, make an appointment with your caregiver to find out the results. Do not assume everything is normal if you have not heard from your caregiver or the medical facility. It is important for you to follow up on all of your test results.  HOME CARE INSTRUCTIONS  You have had sedation and may be sleepy or dizzy. Your thinking may not be as clear as usual. For the next 24 hours:  Only take over-the-counter or prescription medicines for pain, discomfort, and or fever as directed by your caregiver.  Do not drink alcohol.  Do not smoke.  Do not drive.  Do not make important legal decisions.  Do not operate heavy machinery.  Do not care for small children by yourself.  Keep your dressing clean and dry. You may replace dressing with a bandage after 24 hours.  You may take a bath or shower after 24 hours.  Use an ice pack for 20 minutes every 2 hours while awake for pain as needed. SEEK MEDICAL CARE IF:   There is redness, swelling, or increasing pain at the biopsy site.  There is pus coming from the biopsy site.  There is drainage from a biopsy site lasting longer than one day.  An unexplained oral temperature above 102 F (38.9 C) develops. SEEK IMMEDIATE MEDICAL CARE IF:   You develop a rash.  You have difficulty  breathing.  You develop any reaction or side effects to medications given. Document Released: 05/02/2005 Document Revised: 01/05/2012 Document Reviewed: 10/10/2008 [facility] Patient Information 2015 [place], [place]. This information is not intended to replace advice given to you by your health care provider. Make sure you discuss any questions you have with your health care provider.  May remove dressing and shower 24-48 hours post procedure.  Keep site Clean and dry. Report any signs of infection.  Conscious Sedation Sedation is the use of medicines to promote relaxation and relieve discomfort and anxiety. Conscious sedation is a type of sedation. Under conscious sedation you are less alert than normal but are still able to respond to instructions or stimulation. Conscious sedation is used during short medical and dental procedures. It is milder than deep sedation or general anesthesia and allows you to return to your regular activities sooner.  LET [facility] CARE PROVIDER KNOW ABOUT:   Any allergies you have.  All medicines you are taking, including vitamins, herbs, eye drops, creams, and over-the-counter medicines.  Use of steroids (by mouth or creams).  Previous problems you or members of your family have had with the use of anesthetics.  Any blood disorders you have.  Previous surgeries you have had.  Medical conditions you have.  Possibility of pregnancy, if this applies.  Use of cigarettes, alcohol, or illegal drugs. RISKS AND COMPLICATIONS Generally, this is a safe procedure. However, as with any procedure, problems can occur. Possible problems include:  Oversedation.  Trouble breathing on  your own. You may need to have a breathing tube until you are awake and breathing on your own.  Allergic reaction to any of the medicines used for the procedure. BEFORE THE PROCEDURE  You may have blood tests done. These tests can help show how well your kidneys and liver are  working. They can also show how well your blood clots.  A physical exam will be done.  Only take medicines as directed by your health care provider. You may need to stop taking medicines (such as blood thinners, aspirin, or nonsteroidal anti-inflammatory drugs) before the procedure.   Do not eat or drink at least 6 hours before the procedure or as directed by your health care provider.  Arrange for a responsible adult, family member, or friend to take you home after the procedure. He or she should stay with you for at least 24 hours after the procedure, until the medicine has worn off. PROCEDURE   An intravenous (IV) catheter will be inserted into one of your veins. Medicine will be able to flow directly into your body through this catheter. You may be given medicine through this tube to help prevent pain and help you relax.  The medical or dental procedure will be done. AFTER THE PROCEDURE  You will stay in a recovery area until the medicine has worn off. Your blood pressure and pulse will be checked.   Depending on the procedure you had, you may be allowed to go home when you can tolerate liquids and your pain is under control. Document Released: 07/08/2001 Document Revised: 10/18/2013 Document Reviewed: 06/20/2013 [facility] Patient Information 2015 [place], [place]. This information is not intended to replace advice given to you by your health care provider. Make sure you discuss any questions you have with your health care provider.  Conscious Sedation, Adult, Care After Refer to this sheet in the next few weeks. These instructions provide you with information on caring for yourself after your procedure. Your health care provider may also give you more specific instructions. Your treatment has been planned according to current medical practices, but problems sometimes occur. Call your health care provider if you have any problems or questions after your procedure. WHAT TO EXPECT AFTER  THE PROCEDURE  After your procedure:  You may feel sleepy, clumsy, and have poor balance for several hours.  Vomiting may occur if you eat too soon after the procedure. HOME CARE INSTRUCTIONS  Do not participate in any activities where you could become injured for at least 24 hours. Do not:  Drive.  Swim.  Ride a bicycle.  Operate heavy machinery.  Cook.  Use power tools.  Climb ladders.  Work from a high place.  Do not make important decisions or sign legal documents until you are improved.  If you vomit, drink water, juice, or soup when you can drink without vomiting. Make sure you have little or no nausea before eating solid foods.  Only take over-the-counter or prescription medicines for pain, discomfort, or fever as directed by your health care provider.  Make sure you and your family fully understand everything about the medicines given to you, including what side effects may occur.  You should not drink alcohol, take sleeping pills, or take medicines that cause drowsiness for at least 24 hours.  If you smoke, do not smoke without supervision.  If you are feeling better, you may resume normal activities 24 hours after you were sedated.  Keep all appointments with your health care provider. SEEK  MEDICAL CARE IF:  Your skin is pale or bluish in color.  You continue to feel nauseous or vomit.  Your pain is getting worse and is not helped by medicine.  You have bleeding or swelling.  You are still sleepy or feeling clumsy after 24 hours. SEEK IMMEDIATE MEDICAL CARE IF:  You develop a rash.  You have difficulty breathing.  You develop any type of allergic problem.  You have a fever. MAKE SURE YOU:  Understand these instructions.  Will watch your condition.  Will get help right away if you are not doing well or get worse. Document Released: 08/03/2013 Document Reviewed: 08/03/2013 [facility] Patient Information 2015 [place], [place]. This information  is not intended to replace advice given to you by your health care provider. Make sure you discuss any questions you have with your health care provider.

## 2015-01-31 NOTE — Procedures (Signed)
CT guided bone marrow biopsy.  Minimal blood loss.  No immediate complication.   

## 2015-02-05 ENCOUNTER — Telehealth: Payer: Self-pay | Admitting: *Deleted

## 2015-02-05 ENCOUNTER — Telehealth: Payer: Self-pay | Admitting: Adult Health

## 2015-02-05 ENCOUNTER — Ambulatory Visit (HOSPITAL_COMMUNITY): Payer: Medicare HMO

## 2015-02-05 NOTE — Telephone Encounter (Signed)
Rec'd from [facility] forward 4 pages to [facility] NP

## 2015-02-05 NOTE — Telephone Encounter (Signed)
Called requesting Bone Marrow Biopsy results and [place] from 01-31-2015.  Bone Marrow pending as no results noted at this time.  Informed him of abdominal US results.  Was already aware of cysts on kidneys and reports being in Kidney Failure.  Next scheduled F/U 02-14-2015 and Dr. Burr Medico will go over results at this time.

## 2015-02-07 ENCOUNTER — Ambulatory Visit (HOSPITAL_COMMUNITY): Payer: Medicare HMO

## 2015-02-08 LAB — CHROMOSOME ANALYSIS, BONE MARROW

## 2015-02-09 ENCOUNTER — Telehealth: Payer: Self-pay | Admitting: Cardiology

## 2015-02-09 NOTE — Telephone Encounter (Signed)
Received medical records from [facility], sent to Dr. Aundra Dubin. 02/09/15/ss

## 2015-02-11 ENCOUNTER — Other Ambulatory Visit: Payer: Self-pay | Admitting: Internal Medicine

## 2015-02-12 ENCOUNTER — Ambulatory Visit (HOSPITAL_COMMUNITY): Payer: Medicare HMO

## 2015-02-13 ENCOUNTER — Ambulatory Visit (INDEPENDENT_AMBULATORY_CARE_PROVIDER_SITE_OTHER): Payer: Medicare HMO | Admitting: Cardiology

## 2015-02-13 ENCOUNTER — Encounter: Payer: Self-pay | Admitting: Cardiology

## 2015-02-13 ENCOUNTER — Other Ambulatory Visit: Payer: Self-pay | Admitting: *Deleted

## 2015-02-13 ENCOUNTER — Other Ambulatory Visit: Payer: Self-pay | Admitting: Internal Medicine

## 2015-02-13 VITALS — BP 126/80 | HR 55 | Ht 66.0 in | Wt 181.8 lb

## 2015-02-13 DIAGNOSIS — I480 Paroxysmal atrial fibrillation: Secondary | ICD-10-CM

## 2015-02-13 DIAGNOSIS — N184 Chronic kidney disease, stage 4 (severe): Secondary | ICD-10-CM

## 2015-02-13 DIAGNOSIS — I2510 Atherosclerotic heart disease of native coronary artery without angina pectoris: Secondary | ICD-10-CM | POA: Diagnosis not present

## 2015-02-13 DIAGNOSIS — D649 Anemia, unspecified: Secondary | ICD-10-CM

## 2015-02-13 DIAGNOSIS — I10 Essential (primary) hypertension: Secondary | ICD-10-CM

## 2015-02-13 DIAGNOSIS — I5042 Chronic combined systolic (congestive) and diastolic (congestive) heart failure: Secondary | ICD-10-CM

## 2015-02-13 DIAGNOSIS — I2583 Coronary atherosclerosis due to lipid rich plaque: Principal | ICD-10-CM

## 2015-02-13 LAB — BASIC METABOLIC PANEL
BUN: 47 mg/dL — AB (ref 6–23)
CHLORIDE: 100 meq/L (ref 96–112)
CO2: 31 mEq/L (ref 19–32)
CREATININE: 4.5 mg/dL — AB (ref 0.40–1.50)
Calcium: 12.9 mg/dL — ABNORMAL HIGH (ref 8.4–10.5)
GFR: 17.15 mL/min — AB (ref >60.00)
Glucose, Bld: 149 mg/dL — ABNORMAL HIGH (ref 70–99)
POTASSIUM: 3.7 meq/L (ref 3.5–5.1)
Sodium: 136 mEq/L (ref 135–145)

## 2015-02-13 NOTE — Progress Notes (Signed)
Patient ID: Tony Freeman, male   DOB: 1950-12-31, 64 y.o.   MRN: [id number] Primary Physician: Tanya Nones, MD  Primary Cardiologist: [facility] - Dr. Kreg Shropshire. Deno Etienne (ICD)   HPI: Mr. Rathert is a 64 year old man with history of atrial fibrillation now on edoxaban, CAD (s/p multiple stents) chronic combined CHF (echo 04/02/2014 with LV EF: 25-30%, diffuse hypokinesis), ischemic cardiomyopathy s/p dual chamber Biotronik ICD placement 06/29/2014, DM2, CKD stage IV, asthma, HLD, abnormal renal US 03/2014 (followed by urology).  He also has a h/o PCI LAD/RCA in 2007, PCI LAD/RCA in 2009 at [facility], abnormal lexiscan stress test 06/2013 moderate reversible perfusion abnormality in anterior, septal and apical region, then PCI to LAD 08/2013. [facility] records indicate that coronary angio at that time also showed CTO of RCA and nonobstructive disease in the LCx. Echo at [place] at least back to 01/2013 showed EF 35-40%, then <25% in 10/5174 with diastolic dysfunction and elevated RVSP. During his 03/2014 admission at [facility], he was diagnosed with new onset atrial fib-vs-flutter. Echo showed false tendon in LV apex, moderate LVH, EF 25-30%, diffuse HK, mod LAE, trivial TR but no mention of pulm HTN. He also had one troponin that was 0.11 and the rest were negative, felt due to CKD and intracranial etiology with some encephalopathy. He was discharged on Coumadin (along with his Plavix that he was on from prior PCI/stroke). However, it's not totally clear what he did with the Coumadin as it was no longer on his list when he saw his primary cardiologist in the [place] system at the end of June. The patient thinks his cardiologist took him off it but the note doesn't reflect that. (The patient does have some cognitive difficulties since his stroke and lives with his son who manages his medicine.) [facility] notes between June/July/August indicate he had been in NSR. He went on to have ICD placement on  9/3. Reading their notes, it appears they were not aware of his 03/2014 diagnosis of atrial fibrillation until it was picked up on his ICD interrogation in early September at which time he was placed on Edoxaban. Biotronic interrogation confirms AF since ICD implant 06/29/14.  Admitted 07/16/14 with increased dyspnea. Found to be A fib RVR. Had TEE/DC-CV. TEE showed EF 25%, RV mildly dilated and moderately to severely dysfunctional.  Successful cardioversion completed. He required IV lasix and short term milrinone. He transitioned to torsemide 40 mg twice a day. He was on edoxaban prior to admit by cardiologist at [facility] and remained on edoxaban. He was discharged on amiodarone 400 mg daily, carvedilol 6.25 mg twice a day, and hydralazine 50 mg tid/imdur 60 mg daily. Discharged weight was 204 pounds.   Cardiolite in 2/16 showed EF 31%, no ischemia.    He was admitted in 3/16 with acute bronchitis, hypercalcemia, and AKI with creatinine up to 5.5. He is now back home again.  Bone marrow biopsy in 4/16 showed no significant abnormalities.     He comes in today with his son who gives a lot of the history.  He is fatigued/"sluggish" at baseline.  He is short of breath walking < 1 block.  No problems walking into the office today or walking around his house.  He sleeps on 2 pillows chronically.  No PND.  No chest pain.  No palpitations.  He is on edoxaban, no melena or BRBPR.   ECHO 03/2014 EF 25-30%   Labs  07/21/14 TSH 0.413  10/15 K 3.5 => 4.3 Creatinine 3.86 =>  3.96, Na 137  3/16 TSH normal 4/16 K 3.8, creatinine 4.62, HCT 28.8, AST/ALT normal  ECG: NSR at 52, IVCD, anterolateral TWIs  ROS: All systems negative except as listed in HPI, PMH and Problem List.  Past Medical History  Diagnosis Date  . Diabetes mellitus with nephropathy   . Systolic and diastolic CHF, chronic     a. Echo at [facility] 11/17/2013 Ef <25%, severe hypokinesis of LV, moderately dilated LA, grade IV/IV diastolic dysfunction  with irreversible restrictive pattern of mitral inflow, severe pulm HTN (RVSP >37mHg), 1-2+ MR  b. [facility] at [facility] EF 25-30%, moderate concentric LVH, diffuse hypokinesis, moderately dilated LA, no significant MR observed  . CAD (coronary artery disease)     a. PCI LAD/RCA in 2007  b. [facility] c. PCI LAD 08/2013, CTO of RCA and nonobstructive disease in the LCx.  .Marland KitchenHyperlipidemia   . Stroke   . Dementia   . Ischemic cardiomyopathy     a. s/p Biotronik ICD 06/2014.  .Marland KitchenRetinopathy     bilateral  . CKD (chronic kidney disease), stage IV   . HTN (hypertension)   . Abnormal renal ultrasound     a. 03/2014: Abnormal renal ultrasound with left renal lesion and bladder wall thickening and microscopic hematuria. Multiple cysts on prior MRI. Given inability to use contrast, f/u imaging limited, repeat [place] with outpatient uro f/u.  .Marland KitchenParoxysmal atrial fibrillation   . Paroxysmal atrial flutter     Current Outpatient Prescriptions  Medication Sig Dispense Refill  . albuterol (PROVENTIL HFA;VENTOLIN HFA) 108 (90 BASE) MCG/ACT inhaler Inhale 2 puffs into the lungs every 6 (six) hours as needed for wheezing or shortness of breath.    .Marland Kitchenalbuterol (PROVENTIL) (2.5 MG/3ML) 0.083% nebulizer solution Take 2.5 mg by nebulization every 6 (six) hours as needed for wheezing or shortness of breath.    .Marland Kitchenamiodarone (PACERONE) 200 MG tablet Take 100 mg by mouth daily.    .Marland Kitchenatorvastatin (LIPITOR) 40 MG tablet Take 1 tablet (40 mg total) by mouth daily. 30 tablet 0  . bumetanide (BUMEX) 1 MG tablet Take 1 tablet (1 mg total) by mouth 2 (two) times daily.    . carvedilol (COREG) 3.125 MG tablet Take 1 tablet (3.125 mg total) by mouth 2 (two) times daily with a meal. 60 tablet 0  . cholecalciferol (VITAMIN D) 1000 UNITS tablet Take 2,000 Units by mouth daily.    .Marland Kitchenedoxaban (SAVAYSA) 30 MG TABS tablet Take 1 tablet (30 mg total) by mouth daily. 30 tablet 0  . ferrous sulfate 325  (65 FE) MG tablet Take 1 tablet (325 mg total) by mouth 2 (two) times daily with a meal. 90 tablet 3  . hydrALAZINE (APRESOLINE) 50 MG tablet Take 1.5 tablets (75 mg total) by mouth 3 (three) times daily. 405 tablet 1  . insulin aspart (NOVOLOG) 100 UNIT/ML injection Inject 10 Units into the skin 3 (three) times daily with meals. 10 mL 11  . insulin glargine (LANTUS) 100 UNIT/ML injection Inject 0.25 mLs (25 Units total) into the skin at bedtime. 10 mL 11  . isosorbide mononitrate (IMDUR) 60 MG 24 hr tablet Take 60 mg by mouth daily.    . mirtazapine (REMERON) 7.5 MG tablet Take 7.5 mg by mouth.    . mometasone-formoterol (DULERA) 200-5 MCG/ACT AERO Inhale 2 puffs into the lungs 2 (two) times daily.    . Multiple Vitamin (MULTIVITAMIN WITH MINERALS) TABS tablet  Take 1 tablet by mouth daily.    . potassium chloride (K-DUR) 10 MEQ tablet Take 2 tablets (20 mEq total) by mouth daily. 180 tablet 1   No current facility-administered medications for this visit.     PHYSICAL EXAM: Filed Vitals:   02/13/15 1310  BP: 126/80  Pulse: 55  Height: 5' 6"  (1.676 m)  Weight: 181 lb 12.8 oz (82.464 kg)    General:  Well appearing. No resp difficulty. Ambulated slowly in the clinic. Son present  HEENT: normal Neck: supple. JVP 8 cm. Carotids 2+ bilaterally; no bruits. No lymphadenopathy or thryomegaly appreciated. Cor: PMI normal. Regular rate & rhythm. No rubs, gallops. 3/6 HSM at apex.   Lungs: clear Abdomen: soft, nontender, nondistended. No hepatosplenomegaly. No bruits or masses. Good bowel sounds. Extremities: no cyanosis, clubbing, rash.  No lower extremity edema.   Neuro: alert & orientedx3, cranial nerves grossly intact. Moves all 4 extremities w/o difficulty. Affect pleasant.  ASSESSMENT & PLAN: 1. Chronic systolic HF: Ischemic cardiomyopathy. Has Biotronik ICD. ECHO 03/2014 EF 25-30%. TEE 9/15 with EF 25%, mildly dilated RV with moderate to severe systolic dysfunction.  Cardiolite (2/16)  with EF 31%, no ischemia. NYHA II-III symptoms.  He is probably mildly volume overloaded.  - Continue current dose of bumetanide, 1 mg bid.  Will not aggressively increase with recent AKI.   - Continue hydralazine 75 mg tid/imdur 60 mg daily and carvedilol 3.125 mg twice a day. Will not uptitrate beta blocker today with mild bradycardia.   - No ACEI/Spironolactone due to CKD.  - Reinforced daily weights, low salt food choices, and daily weights.   2. Atrial fibrillation: Paroxysmal.  In 9/15, had TEE with no LAA thrombus and successful DC-CV.  He is in NSR today.   - Continue amiodarone 100 mg daily, he remains in NSR.  Recent LFTs and TSH ok, will need regular eye exams.  - Continue edoxaban 30 mg daily (renally dosed).  He has been > 1 year since last PCI so he is not longer on aspirin. .  3. CAD S/P PCI: Last intervention in 11/14. On beta blocker and statin. No aspirin as he is on edoxaban and > 1 year since last PCI. Cardiolite 2/16 with no evidence of ischemia.   4. CKD Stage IV: No ACEI or spironolactone due to CKD. Needs regular followup with Dr Florene Glen. Planning for fistula soon.  BMET today.   Followup in 6 wks at CHF clinic.   Breyah Akhter,MD 4:54 PM  02/13/2015

## 2015-02-13 NOTE — Patient Instructions (Signed)
Medication Instructions:  No medication changes today.  Labwork: BMET today.  Testing/Procedures: None today.  Follow-Up: 6 weeks with Dr Aundra Dubin in the [facility]   Try to limit your sodium/salt intake to 1500mg  daily.

## 2015-02-14 ENCOUNTER — Other Ambulatory Visit (HOSPITAL_BASED_OUTPATIENT_CLINIC_OR_DEPARTMENT_OTHER): Payer: Medicare HMO

## 2015-02-14 ENCOUNTER — Telehealth: Payer: Self-pay | Admitting: Hematology

## 2015-02-14 ENCOUNTER — Encounter (HOSPITAL_COMMUNITY): Payer: Self-pay

## 2015-02-14 ENCOUNTER — Ambulatory Visit (HOSPITAL_BASED_OUTPATIENT_CLINIC_OR_DEPARTMENT_OTHER): Payer: Medicare HMO | Admitting: Hematology

## 2015-02-14 ENCOUNTER — Ambulatory Visit (HOSPITAL_COMMUNITY): Payer: Medicare HMO

## 2015-02-14 ENCOUNTER — Encounter: Payer: Self-pay | Admitting: Hematology

## 2015-02-14 DIAGNOSIS — N184 Chronic kidney disease, stage 4 (severe): Secondary | ICD-10-CM

## 2015-02-14 DIAGNOSIS — E611 Iron deficiency: Secondary | ICD-10-CM | POA: Diagnosis not present

## 2015-02-14 DIAGNOSIS — D638 Anemia in other chronic diseases classified elsewhere: Secondary | ICD-10-CM | POA: Insufficient documentation

## 2015-02-14 DIAGNOSIS — I10 Essential (primary) hypertension: Secondary | ICD-10-CM

## 2015-02-14 DIAGNOSIS — I509 Heart failure, unspecified: Secondary | ICD-10-CM | POA: Diagnosis not present

## 2015-02-14 DIAGNOSIS — J449 Chronic obstructive pulmonary disease, unspecified: Secondary | ICD-10-CM

## 2015-02-14 DIAGNOSIS — D649 Anemia, unspecified: Secondary | ICD-10-CM

## 2015-02-14 DIAGNOSIS — N19 Unspecified kidney failure: Secondary | ICD-10-CM

## 2015-02-14 DIAGNOSIS — D631 Anemia in chronic kidney disease: Secondary | ICD-10-CM

## 2015-02-14 DIAGNOSIS — Z8673 Personal history of transient ischemic attack (TIA), and cerebral infarction without residual deficits: Secondary | ICD-10-CM

## 2015-02-14 DIAGNOSIS — C969 Malignant neoplasm of lymphoid, hematopoietic and related tissue, unspecified: Secondary | ICD-10-CM

## 2015-02-14 DIAGNOSIS — E119 Type 2 diabetes mellitus without complications: Secondary | ICD-10-CM

## 2015-02-14 LAB — CBC WITH DIFFERENTIAL/PLATELET
BASO%: 1 % (ref 0.0–2.0)
Basophils Absolute: 0 10*3/uL (ref 0.0–0.1)
EOS%: 3.3 % (ref 0.0–7.0)
Eosinophils Absolute: 0.2 10*3/uL (ref 0.0–0.5)
HEMATOCRIT: 29.4 % — AB (ref 38.4–49.9)
HGB: 9.4 g/dL — ABNORMAL LOW (ref 13.0–17.1)
LYMPH#: 0.7 10*3/uL — AB (ref 0.9–3.3)
LYMPH%: 13.6 % — ABNORMAL LOW (ref 14.0–49.0)
MCH: 30 pg (ref 27.2–33.4)
MCHC: 32.1 g/dL (ref 32.0–36.0)
MCV: 93.4 fL (ref 79.3–98.0)
MONO#: 1 10*3/uL — AB (ref 0.1–0.9)
MONO%: 19.9 % — ABNORMAL HIGH (ref 0.0–14.0)
NEUT#: 3.1 10*3/uL (ref 1.5–6.5)
NEUT%: 62.2 % (ref 39.0–75.0)
Platelets: 156 10*3/uL (ref 140–400)
RBC: 3.14 10*6/uL — ABNORMAL LOW (ref 4.20–5.82)
RDW: 15.4 % — AB (ref 11.0–14.6)
WBC: 5 10*3/uL (ref 4.0–10.3)

## 2015-02-14 LAB — COMPREHENSIVE METABOLIC PANEL (CC13)
ALT: 18 U/L (ref 0–55)
ANION GAP: 13 meq/L — AB (ref 3–11)
AST: 27 U/L (ref 5–34)
Albumin: 3.6 g/dL (ref 3.5–5.0)
Alkaline Phosphatase: 193 U/L — ABNORMAL HIGH (ref 40–150)
BUN: 45.4 mg/dL — ABNORMAL HIGH (ref 7.0–26.0)
CO2: 25 mEq/L (ref 22–29)
Calcium: 12.8 mg/dL — ABNORMAL HIGH (ref 8.4–10.4)
Chloride: 101 mEq/L (ref 98–109)
Creatinine: 4.7 mg/dL (ref 0.7–1.3)
EGFR: 14 mL/min/{1.73_m2} — AB (ref >90)
Glucose: 124 mg/dl (ref 70–140)
Potassium: 3.9 mEq/L (ref 3.5–5.1)
Sodium: 139 mEq/L (ref 136–145)
Total Bilirubin: 1.17 mg/dL (ref 0.20–1.20)
Total Protein: 7.4 g/dL (ref 6.4–8.3)

## 2015-02-14 NOTE — Progress Notes (Signed)
[place]  [phone] Fax:([phone] Newborn consult Note   Patient Care Team: Tanya Nones, MD as PCP - General (Family Medicine) 02/14/2015  CHIEF COMPLAINTS/PURPOSE OF CONSULTATION:  Anemia   HISTORY OF PRESENTING ILLNESS:  Tony Freeman 64 y.o. male with past medical history significant for hypertension, diabetes, CHF, CK take, and stroke, is here because of anemia.  He had stroke 6 years ago with residual right hand weakness and mild demetia. He is on disbility for that.   He was found to have abnormal CBC from  6 month ago (or longer), his CBC from 11/27/2014 showed hemoglobin 8.4, hematocrit 27.1, MCV 87.4, RDW 17.2. Pre-count and WBC 1 normal. His CMP showed total protein 6.8, calcium 10.6, alkaline phosphatase 226, BUN 39, creatinine 2.95, glucose 233, the rest were all within normal limits.   he complains fatigue for the past year.  He denies recent chest pain on exertion, (+) shortness of breath on mild exertion, no pre-syncopal episodes, or palpitations. He had not noticed any recent bleeding such as epistaxis, hematuria or hematochezia The patient denies over the counter NSAID ingestion. He is not on antiplatelets agents. His last EGD and colonoscopy was normal in one month ago.   He had no prior history or diagnosis of cancer. His age appropriate screening programs are up-to-date. He denies any pica and eats a variety of diet. He never donated blood or received blood transfusion The patient was prescribed oral iron supplements and he takes 2 tab daily for the past 4-6 month.   No fever, chills or night sweats, he feels cold most of time. He lost about 40 lbs in the past one year, but stable in the past 3-4 month. He has low appetite, no nausea or constipation   INTERIM HISTORY: Patient returns for follow-up and discuss bone marrow biopsy result. He presents with his wife and his son today. He has a moderate fatigue,  no chest pain, mild dyspnea on having exertion. Denies any other new symptoms. He feels about same overall. He has been taking iron pill 1 tablet daily and tolerates it well.  MEDICAL HISTORY:  Past Medical History  Diagnosis Date  . Diabetes mellitus with nephropathy   . Systolic and diastolic CHF, chronic     a. Echo at [facility] 11/17/2013 Ef <25%, severe hypokinesis of LV, moderately dilated LA, grade IV/IV diastolic dysfunction with irreversible restrictive pattern of mitral inflow, severe pulm HTN (RVSP >84mHg), 1-2+ MR  [facility]. [facility] EF 25-30%, moderate concentric LVH, diffuse hypokinesis, moderately dilated LA, no significant MR observed  . CAD (coronary artery disease)     a. PCI LAD/RCA in 2007  b. [facility]. PCI LAD 08/2013, CTO of RCA and nonobstructive disease in the LCx.  .Marland KitchenHyperlipidemia   . Stroke   . Dementia   . Ischemic cardiomyopathy     a. s/p Biotronik ICD 06/2014.  .Marland KitchenRetinopathy     bilateral  . CKD (chronic kidney disease), stage IV   . HTN (hypertension)   . Abnormal renal ultrasound     a. 03/2014: Abnormal renal ultrasound with left renal lesion and bladder wall thickening and microscopic hematuria. Multiple cysts on prior MRI. Given inability to use contrast, f/u imaging limited, repeat [place] with outpatient uro f/u.  .Marland KitchenParoxysmal atrial fibrillation   . Paroxysmal atrial flutter     SURGICAL HISTORY: Past Surgical History  Procedure  Laterality Date  . Coronary stent placement      Multivessel coronary intervention in 2007 and 2009. Ischemic cardiomyopathyPCI LAD/ RCA in 2007 by Dr. Dwyane Dee. PCI LAD/RCA in 2009 at [facility]; PCI LAD 08/2013. Presented with unstable angina pectoris.. Coronary angio demonstrated proximal and distal LAD lesions.39/20 PROMUS stent in proximal LAD// PTCA of distal LAD  . Cardiac defibrillator placement    . Spine surgery      anterior fusion  . Hernia repair    . Tee without  cardioversion N/A 07/26/2014    Procedure: TRANSESOPHAGEAL ECHOCARDIOGRAM (TEE);  Surgeon: Larey Dresser, MD;  Location: [place];  Service: Cardiovascular;  Laterality: N/A;  . Cardioversion N/A 07/26/2014    Procedure: CARDIOVERSION;  Surgeon: Larey Dresser, MD;  Location: [place];  Service: Cardiovascular;  Laterality: N/A;  . Right heart catheterization N/A 07/25/2014    Procedure: RIGHT HEART CATH;  Surgeon: Jolaine Artist, MD;  Location: [facility] CATH LAB;  Service: Cardiovascular;  Laterality: N/A;  . Cardiac defibrillator placement  06/2014    SOCIAL HISTORY: History   Social History  . Marital Status: Single    Spouse Name: N/A  . Number of Children: N/A  . Years of Education: N/A   Occupational History  . Not on file.   Social History Main Topics  . Smoking status: Never Smoker   . Smokeless tobacco: Not on file  . Alcohol Use: No  . Drug Use: No  . Sexual Activity: Not on file   Other Topics Concern  . Not on file   Social History Narrative   Going to be living with his son.  He lives with his daughter currently.  2 children.  Disability since CVA    FAMILY HISTORY: Family History  Problem Relation Age of Onset  . Heart disease      brother  . Diabetes Mother   . Cancer Father     brother  . Kidney disease      ALLERGIES:  has No Known Allergies.  MEDICATIONS:  Current Outpatient Prescriptions  Medication Sig Dispense Refill  . albuterol (PROVENTIL HFA;VENTOLIN HFA) 108 (90 BASE) MCG/ACT inhaler Inhale 2 puffs into the lungs every 6 (six) hours as needed for wheezing or shortness of breath.    Marland Kitchen albuterol (PROVENTIL) (2.5 MG/3ML) 0.083% nebulizer solution Take 2.5 mg by nebulization every 6 (six) hours as needed for wheezing or shortness of breath.    Marland Kitchen amiodarone (PACERONE) 200 MG tablet Take 100 mg by mouth daily.    Marland Kitchen atorvastatin (LIPITOR) 40 MG tablet Take 1 tablet (40 mg total) by mouth daily. 30 tablet 0  . bumetanide (BUMEX) 1 MG tablet  Take 1 tablet (1 mg total) by mouth 2 (two) times daily.    . carvedilol (COREG) 3.125 MG tablet Take 1 tablet (3.125 mg total) by mouth 2 (two) times daily with a meal. 60 tablet 0  . cholecalciferol (VITAMIN D) 1000 UNITS tablet Take 2,000 Units by mouth daily.    Marland Kitchen edoxaban (SAVAYSA) 30 MG TABS tablet Take 1 tablet (30 mg total) by mouth daily. 30 tablet 0  . ferrous sulfate 325 (65 FE) MG tablet Take 1 tablet (325 mg total) by mouth 2 (two) times daily with a meal. 90 tablet 3  . hydrALAZINE (APRESOLINE) 50 MG tablet Take 1.5 tablets (75 mg total) by mouth 3 (three) times daily. 405 tablet 1  . insulin aspart (NOVOLOG) 100 UNIT/ML injection Inject 10 Units into the skin 3 (three)  times daily with meals. 10 mL 11  . insulin glargine (LANTUS) 100 UNIT/ML injection Inject 0.25 mLs (25 Units total) into the skin at bedtime. 10 mL 11  . isosorbide mononitrate (IMDUR) 60 MG 24 hr tablet Take 60 mg by mouth daily.    . mirtazapine (REMERON) 7.5 MG tablet Take 7.5 mg by mouth.    . mometasone-formoterol (DULERA) 200-5 MCG/ACT AERO Inhale 2 puffs into the lungs 2 (two) times daily.    . Multiple Vitamin (MULTIVITAMIN WITH MINERALS) TABS tablet Take 1 tablet by mouth daily.    . potassium chloride (K-DUR) 10 MEQ tablet Take 2 tablets (20 mEq total) by mouth daily. 180 tablet 1   No current facility-administered medications for this visit.    REVIEW OF SYSTEMS:   Constitutional: Denies fevers, chills or abnormal night sweats Eyes: Denies blurriness of vision, double vision or watery eyes Ears, nose, mouth, throat, and face: Denies mucositis or sore throat Respiratory: Denies cough, dyspnea or wheezes Cardiovascular: Denies palpitation, chest discomfort or lower extremity swelling Gastrointestinal:  Denies nausea, heartburn or change in bowel habits Skin: Denies abnormal skin rashes Lymphatics: Denies new lymphadenopathy or easy bruising Neurological:Denies numbness, tingling or new  weaknesses Behavioral/Psych: Mood is stable, no new changes  All other systems were reviewed with the patient and are negative.  PHYSICAL EXAMINATION: ECOG PERFORMANCE STATUS: 1 - Symptomatic but completely ambulatory  blood pressure 126/80, heart rate 55, weight 181 pounds. GENERAL:alert, no distress and comfortable SKIN: skin color, texture, turgor are normal, no rashes or significant lesions EYES: normal, conjunctiva are pink and non-injected, sclera clear OROPHARYNX:no exudate, no erythema and lips, buccal mucosa, and tongue normal  NECK: supple, thyroid normal size, non-tender, without nodularity LYMPH:  no palpable lymphadenopathy in the cervical, axillary or inguinal LUNGS: clear to auscultation and percussion with normal breathing effort HEART: regular rate & rhythm and no murmurs and no lower extremity edema ABDOMEN:abdomen soft, non-tender and normal bowel sounds Musculoskeletal:no cyanosis of digits and no clubbing  PSYCH: alert & oriented x 3 with fluent speech NEURO: no focal motor/sensory deficits  LABORATORY DATA:  I have reviewed the data as listed CBC Latest Ref Rng 02/14/2015 01/31/2015 01/26/2015  WBC 4.0 - 10.3 10e3/uL 5.0 6.2 4.5  Hemoglobin 13.0 - 17.1 g/dL 9.4(L) 9.0(L) 8.2(L)  Hematocrit 38.4 - 49.9 % 29.4(L) 28.8(L) 25.7(L)  Platelets 140 - 400 10e3/uL 156 188 133(L)    CMP Latest Ref Rng 02/14/2015 02/13/2015 01/31/2015  Glucose 70 - 140 mg/dl 124 149(H) 63(L)  BUN 7.0 - 26.0 mg/dL 45.4(H) 47(H) 62(H)  Creatinine 0.7 - 1.3 mg/dL 4.7(HH) 4.50(HH) 4.62(H)  Sodium 136 - 145 mEq/L 139 136 141  Potassium 3.5 - 5.1 mEq/L 3.9 3.7 3.8  Chloride 96 - 112 mEq/L - 100 102  CO2 22 - 29 mEq/L 25 31 29   Calcium 8.4 - 10.4 mg/dL 12.8(H) 12.9(H) 11.8(H)  Total Protein 6.4 - 8.3 g/dL 7.4 - -  Total Bilirubin 0.20 - 1.20 mg/dL 1.17 - -  Alkaline Phos 40 - 150 U/L 193(H) - -  AST 5 - 34 U/L 27 - -  ALT 0 - 55 U/L 18 - -   PATHOLOGY REPORT: Diagnosis 4//2016 Bone Marrow,  Aspirate,Biopsy, and Clot, right ilium - SLIGHTLY HYPERCELLULAR BONE MARROW FOR AGE WITH TRILINEAGE HEMATOPOIESIS. - SEVERAL GRANULOMATA PRESENT. - SEE COMMENT. PERIPHERAL BLOOD: - NORMOCYTIC-NORMOCHROMIC ANEMIA. - LYMPHOPENIA. Diagnosis Note The bone marrow is slightly hypercellular for age with trilineage hematopoiesis and nonspecific changes. Significant dyspoiesis is not present.  The clot and core biopsy sections show several variably sized but predominantly small epithelioid granulomata with lack of necrosis. Special stains for microorganisms including AFB and GMS are negative. In this setting, the differential diagnosis includes infection, medication, autoimmune disease, sarcoidosis, etc. Clinical correlation is strongly recommended. (BNS:ecj 02/02/2015)  Cytogenetic: normal   RADIOGRAPHIC STUDIES: I have personally reviewed the radiological images as listed and agreed with the findings in the report. Dg Chest 2 View  01/24/2015   CLINICAL DATA:  Weakness  EXAM: CHEST  2 VIEW  COMPARISON:  07/16/2014  FINDINGS: Cardiac enlargement. Negative for heart failure. Coronary artery stent. AICD unchanged in position.  Small right pleural effusion. Negative for edema. No effusion on the left. Negative for pneumonia.  IMPRESSION: Cardiac enlargement with small right pleural effusion. Negative for pulmonary edema.   Electronically Signed   By: Franchot Gallo M.D.   On: 01/24/2015 10:35   US Abdomen Complete  01/31/2015   CLINICAL DATA:  Anemia, evaluate liver and spleen  EXAM: ULTRASOUND ABDOMEN COMPLETE  COMPARISON:  10/17/2014  FINDINGS: Gallbladder: Cholelithiasis with gallbladder sludge. Mild gallbladder wall thickening, favored to reflect underdistention. Negative sonographic Murphy's sign.  Common bile duct: Diameter: 2 mm  Liver: No focal lesion identified. Within normal limits in parenchymal echogenicity.  IVC: No abnormality visualized.  Pancreas: Visualized portion unremarkable.  Spleen: Size  and appearance within normal limits.  Right Kidney: Length: 11.5 cm. 3.3 x 2.8 x 3.7 cm upper pole renal cyst. 2.5 x 2.6 x 2.2 cm lower pole renal cyst. No hydronephrosis.  Left Kidney: Length: 10.0 cm. 1.8 x 2.1 x 1.6 cm interpolar renal cyst. No hydronephrosis.  Abdominal aorta: No aneurysm visualized.  Other findings: Ascites.  Right pleural effusion.  IMPRESSION: Spleen is normal in size.  No focal hepatic lesion is seen.  Cholelithiasis, without associated sonographic findings suggest acute cholecystitis.  Bilateral renal cysts.  Ascites.  Right pleural effusion.   Electronically Signed   By: Julian Hy M.D.   On: 01/31/2015 08:53   Ct Biopsy  01/31/2015   CLINICAL DATA:  64 year old with anemia, unspecified anemia type.  EXAM: CT GUIDED BONE MARROW ASPIRATES AND BIOPSY  Physician: Stephan Minister. Henn, MD  MEDICATIONS: 2 mg Versed, 75 mcg fentanyl. A radiology nurse monitored the patient for moderate sedation.  ANESTHESIA/SEDATION: Sedation time: 10 minutes  PROCEDURE: The procedure was explained to the patient. The risks and benefits of the procedure were discussed and the patient's questions were addressed. Informed consent was obtained from the patient. The patient was placed prone on CT scan. Images of the pelvis were obtained. The right side of back was prepped and draped in sterile fashion. The skin and right posterior iliac bone were anesthetized with 1% lidocaine. 11 gauge bone needle was directed into the right iliac bone with CT guidance. Two aspirates and 1 core biopsy obtained. Bandage placed over the puncture site.  FINDINGS: Needle directed into the posterior right ilium.  Estimated blood loss: Minimal  COMPLICATIONS: None  IMPRESSION: CT guided bone marrow aspirates and core biopsy.   Electronically Signed   By: Markus Daft M.D.   On: 01/31/2015 12:28    ASSESSMENT & PLAN:  64 year old male with multiple comorbidities, including hypertension, diabetes, CHF, stroke, dementia, stage IV CKD,  here for evaluation of his anemia.  1. Anemia of chronic disease, secondary to CKD -He had a normal CBC in June 2015, developed worsening anemia since then. His CBC today showed a normal MCV, low reticulocyte count  at 42,  This is consistent with normocytic hypo-productive anemia -His iron study showed ferritin 279, normal serum iron and TIBC, slightly low iron saturation, normal folic acid and I73 level, this is not consistent with nutritional anemia -his TSH, testosterone level, , SPEP/UPEP with immunofixation were unremarkable. erythropoietin level was 34.3, mild elevated. -I discussed his bone marrow biopsy, no evidence of marrow disease. -I think his anemia is mainly secondary to his stage IV chronic kidney disease, possible small component of iron deficiency.  -I discussed the treatment option of EPO injection. The benefit and risks, such as thrombosis, especially heart attack and stroke, we'll discussed with patient and his family in great details. He agrees to proceed. -I'll schedule his first Aranesp injection next week, 100 mcg every 4 weeks, and adjust the dose as needed, the goal of hemoglobin is 10-11.  2. 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 -I would like to ruled out sarcoidosis. We'll obtain a CT chest abdomen and pelvis without contrast.  3. stage IV CKD -He is going to follow-up with his nephrologist here.. -He is planned to have a fistula in left arm put in in the near future.  4. Hypertension, diabetes, CHF, COPD,  history of stroke -He will continue follow-up with his primary care physician and other specialties.  Plan -Aranesp injection next week and monthly  -Return to clinic in 5 weeks -CT scan before next visit  All q with any problems, questions or concerns. I spent 25 minutes counseling the patient face to face. The total time spent in the appointment was 55 minutes  and more than 50% was on counseling.     Truitt Merle, MD 02/14/2015 10:36 AM

## 2015-02-14 NOTE — Telephone Encounter (Signed)
Gave and printed appt sched and avs for pt for April and May....gv pt barium

## 2015-02-19 ENCOUNTER — Ambulatory Visit (HOSPITAL_COMMUNITY): Payer: Medicare HMO

## 2015-02-21 ENCOUNTER — Ambulatory Visit (HOSPITAL_BASED_OUTPATIENT_CLINIC_OR_DEPARTMENT_OTHER): Payer: Medicare HMO

## 2015-02-21 ENCOUNTER — Ambulatory Visit (HOSPITAL_COMMUNITY): Payer: Medicare HMO

## 2015-02-21 VITALS — BP 140/58 | HR 50 | Temp 99.4°F

## 2015-02-21 DIAGNOSIS — N184 Chronic kidney disease, stage 4 (severe): Secondary | ICD-10-CM

## 2015-02-21 DIAGNOSIS — D649 Anemia, unspecified: Secondary | ICD-10-CM

## 2015-02-21 DIAGNOSIS — D631 Anemia in chronic kidney disease: Secondary | ICD-10-CM | POA: Diagnosis not present

## 2015-02-21 MED ORDER — DARBEPOETIN ALFA 100 MCG/0.5ML IJ SOSY
100.0000 ug | PREFILLED_SYRINGE | Freq: Once | INTRAMUSCULAR | Status: AC
  Administered 2015-02-21: 100 ug via SUBCUTANEOUS
  Filled 2015-02-21: qty 0.5

## 2015-02-21 NOTE — Patient Instructions (Signed)
Darbepoetin Alfa injection What is this medicine? DARBEPOETIN ALFA (dar be POE e tin AL fa) helps your body make more red blood cells. It is used to treat anemia caused by chronic kidney failure and chemotherapy. This medicine may be used for other purposes; ask your health care provider or pharmacist if you have questions. COMMON BRAND NAME(S): Aranesp What should I tell my health care provider before I take this medicine? They need to know if you have any of these conditions: -blood clotting disorders or history of blood clots -cancer patient not on chemotherapy -cystic fibrosis -heart disease, such as angina, heart failure, or a history of a heart attack -hemoglobin level of 12 g/dL or greater -high blood pressure -low levels of folate, iron, or vitamin B12 -seizures -an unusual or allergic reaction to darbepoetin, erythropoietin, albumin, hamster proteins, latex, other medicines, foods, dyes, or preservatives -pregnant or trying to get pregnant -breast-feeding How should I use this medicine? This medicine is for injection into a vein or under the skin. It is usually given by a health care professional in a hospital or clinic setting. If you get this medicine at home, you will be taught how to prepare and give this medicine. Do not shake the solution before you withdraw a dose. Use exactly as directed. Take your medicine at regular intervals. Do not take your medicine more often than directed. It is important that you put your used needles and syringes in a special sharps container. Do not put them in a trash can. If you do not have a sharps container, call your pharmacist or healthcare provider to get one. Talk to your pediatrician regarding the use of this medicine in children. While this medicine may be used in children as young as 1 year for selected conditions, precautions do apply. Overdosage: If you think you have taken too much of this medicine contact a poison control center or  emergency room at once. NOTE: This medicine is only for you. Do not share this medicine with others. What if I miss a dose? If you miss a dose, take it as soon as you can. If it is almost time for your next dose, take only that dose. Do not take double or extra doses. What may interact with this medicine? Do not take this medicine with any of the following medications: -epoetin alfa This list may not describe all possible interactions. Give your health care provider a list of all the medicines, herbs, non-prescription drugs, or dietary supplements you use. Also tell them if you smoke, drink alcohol, or use illegal drugs. Some items may interact with your medicine. What should I watch for while using this medicine? Visit your prescriber or health care professional for regular checks on your progress and for the needed blood tests and blood pressure measurements. It is especially important for the doctor to make sure your hemoglobin level is in the desired range, to limit the risk of potential side effects and to give you the best benefit. Keep all appointments for any recommended tests. Check your blood pressure as directed. Ask your doctor what your blood pressure should be and when you should contact him or her. As your body makes more red blood cells, you may need to take iron, folic acid, or vitamin B supplements. Ask your doctor or health care provider which products are right for you. If you have kidney disease continue dietary restrictions, even though this medication can make you feel better. Talk with your doctor or health   care professional about the foods you eat and the vitamins that you take. What side effects may I notice from receiving this medicine? Side effects that you should report to your doctor or health care professional as soon as possible: -allergic reactions like skin rash, itching or hives, swelling of the face, lips, or tongue -breathing problems -changes in vision -chest  pain -confusion, trouble speaking or understanding -feeling faint or lightheaded, falls -high blood pressure -muscle aches or pains -pain, swelling, warmth in the leg -rapid weight gain -severe headaches -sudden numbness or weakness of the face, arm or leg -trouble walking, dizziness, loss of balance or coordination -seizures (convulsions) -swelling of the ankles, feet, hands -unusually weak or tired Side effects that usually do not require medical attention (report to your doctor or health care professional if they continue or are bothersome): -diarrhea -fever, chills (flu-like symptoms) -headaches -nausea, vomiting -redness, stinging, or swelling at site where injected This list may not describe all possible side effects. Call your doctor for medical advice about side effects. You may report side effects to FDA at 1-800-FDA-1088. Where should I keep my medicine? Keep out of the reach of children. Store in a refrigerator between 2 and 8 degrees C (36 and 46 degrees F). Do not freeze. Do not shake. Throw away any unused portion if using a single-dose vial. Throw away any unused medicine after the expiration date. NOTE: This sheet is a summary. It may not cover all possible information. If you have questions about this medicine, talk to your doctor, pharmacist, or health care provider.  2015, [employer]. (2008-09-26 10:23:57)  

## 2015-02-24 ENCOUNTER — Other Ambulatory Visit: Payer: Self-pay | Admitting: Internal Medicine

## 2015-02-26 ENCOUNTER — Ambulatory Visit (HOSPITAL_COMMUNITY): Payer: Medicare HMO

## 2015-02-28 ENCOUNTER — Ambulatory Visit (HOSPITAL_COMMUNITY): Payer: Medicare HMO

## 2015-03-05 ENCOUNTER — Ambulatory Visit (HOSPITAL_COMMUNITY): Payer: Medicare HMO

## 2015-03-07 ENCOUNTER — Ambulatory Visit (HOSPITAL_COMMUNITY): Payer: Medicare HMO

## 2015-03-12 ENCOUNTER — Ambulatory Visit (HOSPITAL_COMMUNITY): Payer: Medicare HMO

## 2015-03-14 ENCOUNTER — Ambulatory Visit (HOSPITAL_COMMUNITY): Payer: Medicare HMO

## 2015-03-14 ENCOUNTER — Ambulatory Visit (HOSPITAL_COMMUNITY)
Admission: RE | Admit: 2015-03-14 | Discharge: 2015-03-14 | Disposition: A | Discharge status: Home / Self Care | Payer: Medicare HMO | Source: Ambulatory Visit | Attending: Hematology | Admitting: Hematology

## 2015-03-14 DIAGNOSIS — R0602 Shortness of breath: Secondary | ICD-10-CM | POA: Insufficient documentation

## 2015-03-14 DIAGNOSIS — R531 Weakness: Secondary | ICD-10-CM | POA: Insufficient documentation

## 2015-03-14 DIAGNOSIS — D649 Anemia, unspecified: Secondary | ICD-10-CM | POA: Diagnosis not present

## 2015-03-14 DIAGNOSIS — R5383 Other fatigue: Secondary | ICD-10-CM | POA: Diagnosis present

## 2015-03-19 ENCOUNTER — Ambulatory Visit (HOSPITAL_COMMUNITY): Payer: Medicare HMO

## 2015-03-21 ENCOUNTER — Other Ambulatory Visit: Payer: Self-pay | Admitting: *Deleted

## 2015-03-21 ENCOUNTER — Ambulatory Visit (HOSPITAL_COMMUNITY): Payer: Medicare HMO

## 2015-03-21 DIAGNOSIS — D638 Anemia in other chronic diseases classified elsewhere: Secondary | ICD-10-CM

## 2015-03-22 ENCOUNTER — Ambulatory Visit (HOSPITAL_BASED_OUTPATIENT_CLINIC_OR_DEPARTMENT_OTHER): Payer: Medicare HMO | Admitting: Hematology

## 2015-03-22 ENCOUNTER — Ambulatory Visit: Payer: Medicare HMO

## 2015-03-22 ENCOUNTER — Encounter: Payer: Self-pay | Admitting: Hematology

## 2015-03-22 ENCOUNTER — Other Ambulatory Visit (HOSPITAL_BASED_OUTPATIENT_CLINIC_OR_DEPARTMENT_OTHER): Payer: Medicare HMO

## 2015-03-22 ENCOUNTER — Telehealth: Payer: Self-pay | Admitting: Hematology

## 2015-03-22 VITALS — BP 150/67 | HR 53 | Temp 98.1°F | Resp 17 | Ht 66.0 in | Wt 178.8 lb

## 2015-03-22 DIAGNOSIS — D638 Anemia in other chronic diseases classified elsewhere: Secondary | ICD-10-CM

## 2015-03-22 DIAGNOSIS — N184 Chronic kidney disease, stage 4 (severe): Secondary | ICD-10-CM

## 2015-03-22 DIAGNOSIS — D631 Anemia in chronic kidney disease: Secondary | ICD-10-CM

## 2015-03-22 DIAGNOSIS — E119 Type 2 diabetes mellitus without complications: Secondary | ICD-10-CM | POA: Diagnosis not present

## 2015-03-22 DIAGNOSIS — I509 Heart failure, unspecified: Secondary | ICD-10-CM

## 2015-03-22 DIAGNOSIS — I10 Essential (primary) hypertension: Secondary | ICD-10-CM | POA: Diagnosis not present

## 2015-03-22 DIAGNOSIS — D649 Anemia, unspecified: Secondary | ICD-10-CM

## 2015-03-22 DIAGNOSIS — J449 Chronic obstructive pulmonary disease, unspecified: Secondary | ICD-10-CM

## 2015-03-22 LAB — COMPREHENSIVE METABOLIC PANEL (CC13)
ALBUMIN: 3.8 g/dL (ref 3.5–5.0)
ALT: 19 U/L (ref 0–55)
ANION GAP: 12 meq/L — AB (ref 3–11)
AST: 26 U/L (ref 5–34)
Alkaline Phosphatase: 208 U/L — ABNORMAL HIGH (ref 40–150)
BILIRUBIN TOTAL: 1.35 mg/dL — AB (ref 0.20–1.20)
BUN: 48.6 mg/dL — ABNORMAL HIGH (ref 7.0–26.0)
CO2: 26 meq/L (ref 22–29)
CREATININE: 4.1 mg/dL — AB (ref 0.7–1.3)
Calcium: 13.8 mg/dL (ref 8.4–10.4)
Chloride: 100 mEq/L (ref 98–109)
EGFR: 17 mL/min/{1.73_m2} — ABNORMAL LOW (ref >90)
GLUCOSE: 251 mg/dL — AB (ref 70–140)
POTASSIUM: 4.1 meq/L (ref 3.5–5.1)
Sodium: 138 mEq/L (ref 136–145)
Total Protein: 8.1 g/dL (ref 6.4–8.3)

## 2015-03-22 LAB — CBC WITH DIFFERENTIAL/PLATELET
BASO%: 0.7 % (ref 0.0–2.0)
Basophils Absolute: 0 10*3/uL (ref 0.0–0.1)
EOS ABS: 0.2 10*3/uL (ref 0.0–0.5)
EOS%: 3.3 % (ref 0.0–7.0)
HEMATOCRIT: 34.6 % — AB (ref 38.4–49.9)
HGB: 11.3 g/dL — ABNORMAL LOW (ref 13.0–17.1)
LYMPH%: 11.8 % — ABNORMAL LOW (ref 14.0–49.0)
MCH: 31 pg (ref 27.2–33.4)
MCHC: 32.7 g/dL (ref 32.0–36.0)
MCV: 94.8 fL (ref 79.3–98.0)
MONO#: 0.9 10*3/uL (ref 0.1–0.9)
MONO%: 16.6 % — AB (ref 0.0–14.0)
NEUT#: 3.7 10*3/uL (ref 1.5–6.5)
NEUT%: 67.6 % (ref 39.0–75.0)
Platelets: 172 10*3/uL (ref 140–400)
RBC: 3.65 10*6/uL — AB (ref 4.20–5.82)
RDW: 13.3 % (ref 11.0–14.6)
WBC: 5.5 10*3/uL (ref 4.0–10.3)
lymph#: 0.7 10*3/uL — ABNORMAL LOW (ref 0.9–3.3)

## 2015-03-22 MED ORDER — DARBEPOETIN ALFA 100 MCG/0.5ML IJ SOSY: 100.0000 ug | PREFILLED_SYRINGE | Freq: Once | INTRAMUSCULAR | Status: DC

## 2015-03-22 NOTE — Telephone Encounter (Signed)
per pof ot [facility] pt appt-gave pt copy of sch

## 2015-03-22 NOTE — Progress Notes (Signed)
[name]  [phone] Fax:([phone]) [place] consult Note   Patient Care Team: Tanya Nones, MD as PCP - General (Family Medicine) 03/22/2015  CHIEF COMPLAINTS/PURPOSE OF CONSULTATION:  Anemia   HISTORY OF PRESENTING ILLNESS:  Tony Freeman 64 y.o. male with past medical history significant for hypertension, diabetes, CHF, CK take, and stroke, is here because of anemia.  He had stroke 6 years ago with residual right hand weakness and mild demetia. He is on disbility for that.   He was found to have abnormal CBC from  6 month ago (or longer), his CBC from 11/27/2014 showed hemoglobin 8.4, hematocrit 27.1, MCV 87.4, RDW 17.2. Pre-count and WBC 1 normal. His CMP showed total protein 6.8, calcium 10.6, alkaline phosphatase 226, BUN 39, creatinine 2.95, glucose 233, the rest were all within normal limits.   he complains fatigue for the past year.  He denies recent chest pain on exertion, (+) shortness of breath on mild exertion, no pre-syncopal episodes, or palpitations. He had not noticed any recent bleeding such as epistaxis, hematuria or hematochezia The patient denies over the counter NSAID ingestion. He is not on antiplatelets agents. His last EGD and colonoscopy was normal in one month ago.   He had no prior history or diagnosis of cancer. His age appropriate screening programs are up-to-date. He denies any pica and eats a variety of diet. He never donated blood or received blood transfusion The patient was prescribed oral iron supplements and he takes 2 tab daily for the past 4-6 month.   No fever, chills or night sweats, he feels cold most of time. He lost about 40 lbs in the past one year, but stable in the past 3-4 month. He has low appetite, no nausea or constipation   INTERIM HISTORY: Patient returns for follow-up. He started Aranesp injection 1 months ago, tolerated well. His energy level is slightly better. No other new  complaints. He still has moderate fatigue, falls asleep during the day, feels code sometime. He is not a very physically active, sitting around most of time during day.  No other new complaints.  MEDICAL HISTORY:  Past Medical History  Diagnosis Date  . Diabetes mellitus with nephropathy   . Systolic and diastolic CHF, chronic     a. Echo at [facility] 11/17/2013 Ef <25%, severe hypokinesis of LV, moderately dilated LA, grade IV/IV diastolic dysfunction with irreversible restrictive pattern of mitral inflow, severe pulm HTN (RVSP >34mHg), 1-2+ MR  b[facility] EF 25-30%, moderate concentric LVH, diffuse hypokinesis, moderately dilated LA, no significant MR observed  . CAD (coronary artery disease)     a. PCI LAD/RCA in 2007  b. [facility] c. PCI LAD 08/2013, CTO of RCA and nonobstructive disease in the LCx.  .Marland KitchenHyperlipidemia   . Stroke   . Dementia   . Ischemic cardiomyopathy     a. s/p Biotronik ICD 06/2014.  .Marland KitchenRetinopathy     bilateral  . CKD (chronic kidney disease), stage IV   . HTN (hypertension)   . Abnormal renal ultrasound     a. 03/2014: Abnormal renal ultrasound with left renal lesion and bladder wall thickening and microscopic hematuria. Multiple cysts on prior MRI. Given inability to use contrast, f/u imaging limited, repeat [place] with outpatient uro f/u.  .Marland KitchenParoxysmal atrial fibrillation   . Paroxysmal atrial flutter     SURGICAL HISTORY: Past Surgical History  Procedure  Laterality Date  . Coronary stent placement      Multivessel coronary intervention in 2007 and 2009. Ischemic cardiomyopathyPCI LAD/ RCA in 2007 by Dr. Dwyane Dee. PCI LAD/RCA in 2009 at [facility]; PCI LAD 08/2013. Presented with unstable angina pectoris.. Coronary angio demonstrated proximal and distal LAD lesions.63/20 PROMUS stent in proximal LAD// PTCA of distal LAD  . Cardiac defibrillator placement    . Spine surgery      anterior fusion  . Hernia repair    .  Tee without cardioversion N/A 07/26/2014    Procedure: TRANSESOPHAGEAL ECHOCARDIOGRAM (TEE);  Surgeon: Larey Dresser, MD;  Location: [place];  Service: Cardiovascular;  Laterality: N/A;  . Cardioversion N/A 07/26/2014    Procedure: CARDIOVERSION;  Surgeon: Larey Dresser, MD;  Location: [place];  Service: Cardiovascular;  Laterality: N/A;  . Right heart catheterization N/A 07/25/2014    Procedure: RIGHT HEART CATH;  Surgeon: Jolaine Artist, MD;  Location: [facility] C[facility] LAB;  Service: Cardiovascular;  Laterality: N/A;  . Cardiac defibrillator placement  06/2014    SOCIAL HISTORY: History   Social History  . Marital Status: Single    Spouse Name: N/A  . Number of Children: N/A  . Years of Education: N/A   Occupational History  . Not on file.   Social History Main Topics  . Smoking status: Never Smoker   . Smokeless tobacco: Not on file  . Alcohol Use: No  . Drug Use: No  . Sexual Activity: Not on file   Other Topics Concern  . Not on file   Social History Narrative   Going to be living with his son.  He lives with his daughter currently.  2 children.  Disability since CVA    FAMILY HISTORY: Family History  Problem Relation Age of Onset  . Heart disease      brother  . Diabetes Mother   . Cancer Father     brother  . Kidney disease      ALLERGIES:  has No Known Allergies.  MEDICATIONS:  Current Outpatient Prescriptions  Medication Sig Dispense Refill  . albuterol (PROVENTIL HFA;VENTOLIN HFA) 108 (90 BASE) MCG/ACT inhaler Inhale 2 puffs into the lungs every 6 (six) hours as needed for wheezing or shortness of breath.    Marland Kitchen albuterol (PROVENTIL) (2.5 MG/3ML) 0.083% nebulizer solution Take 2.5 mg by nebulization every 6 (six) hours as needed for wheezing or shortness of breath.    Marland Kitchen amiodarone (PACERONE) 200 MG tablet Take 100 mg by mouth daily.    Marland Kitchen atorvastatin (LIPITOR) 40 MG tablet Take 1 tablet (40 mg total) by mouth daily. 30 tablet 0  . bumetanide (BUMEX)  1 MG tablet Take 1 tablet (1 mg total) by mouth 2 (two) times daily.    . carvedilol (COREG) 3.125 MG tablet Take 1 tablet (3.125 mg total) by mouth 2 (two) times daily with a meal. 60 tablet 0  . cholecalciferol (VITAMIN D) 1000 UNITS tablet Take 2,000 Units by mouth daily.    Marland Kitchen edoxaban (SAVAYSA) 30 MG TABS tablet Take 1 tablet (30 mg total) by mouth daily. 30 tablet 0  . ferrous sulfate 325 (65 FE) MG tablet Take 1 tablet (325 mg total) by mouth 2 (two) times daily with a meal. 90 tablet 3  . hydrALAZINE (APRESOLINE) 50 MG tablet Take 1.5 tablets (75 mg total) by mouth 3 (three) times daily. 405 tablet 1  . insulin aspart (NOVOLOG) 100 UNIT/ML injection Inject 10 Units into the skin 3 (three)  times daily with meals. 10 mL 11  . insulin glargine (LANTUS) 100 UNIT/ML injection Inject 0.25 mLs (25 Units total) into the skin at bedtime. 10 mL 11  . isosorbide mononitrate (IMDUR) 60 MG 24 hr tablet Take 60 mg by mouth daily.    . mirtazapine (REMERON) 7.5 MG tablet Take 7.5 mg by mouth.    . mometasone-formoterol (DULERA) 200-5 MCG/ACT AERO Inhale 2 puffs into the lungs 2 (two) times daily.    . Multiple Vitamin (MULTIVITAMIN WITH MINERALS) TABS tablet Take 1 tablet by mouth daily.    . potassium chloride (K-DUR) 10 MEQ tablet Take 2 tablets (20 mEq total) by mouth daily. 180 tablet 1   No current facility-administered medications for this visit.    REVIEW OF SYSTEMS:   Constitutional: Denies fevers, chills or abnormal night sweats Eyes: Denies blurriness of vision, double vision or watery eyes Ears, nose, mouth, throat, and face: Denies mucositis or sore throat Respiratory: Denies cough, dyspnea or wheezes Cardiovascular: Denies palpitation, chest discomfort or lower extremity swelling Gastrointestinal:  Denies nausea, heartburn or change in bowel habits Skin: Denies abnormal skin rashes Lymphatics: Denies new lymphadenopathy or easy bruising Neurological:Denies numbness, tingling or new  weaknesses Behavioral/Psych: Mood is stable, no new changes  All other systems were reviewed with the patient and are negative.  PHYSICAL EXAMINATION: ECOG PERFORMANCE STATUS: 1 - Symptomatic but completely ambulatory BP 150/67 mmHg  Pulse 53  Temp(Src) 98.1 F (36.7 C) (Oral)  Resp 17  Ht _0  (1.676 m)  Wt 178 lb 12.8 oz (81.103 kg)  BMI 28.87 kg/m2  SpO2 99%  GENERAL:alert, no distress and comfortable SKIN: skin color, texture, turgor are normal, no rashes or significant lesions EYES: normal, conjunctiva are pink and non-injected, sclera clear OROPHARYNX:no exudate, no erythema and lips, buccal mucosa, and tongue normal  NECK: supple, thyroid normal size, non-tender, without nodularity LYMPH:  no palpable lymphadenopathy in the cervical, axillary or inguinal LUNGS: clear to auscultation and percussion with normal breathing effort HEART: regular rate & rhythm and no murmurs and no lower extremity edema ABDOMEN:abdomen soft, non-tender and normal bowel sounds Musculoskeletal:no cyanosis of digits and no clubbing  PSYCH: alert & oriented x 3 with fluent speech NEURO: no focal motor/sensory deficits  LABORATORY DATA:  I have reviewed the data as listed CBC Latest Ref Rng 03/22/2015 02/14/2015 01/31/2015  WBC 4.0 - 10.3 10e3/uL 5.5 5.0 6.2  Hemoglobin 13.0 - 17.1 g/dL 11.3(L) 9.4(L) 9.0(L)  Hematocrit 38.4 - 49.9 % 34.6(L) 29.4(L) 28.8(L)  Platelets 140 - 400 10e3/uL 172 156 188    CMP Latest Ref Rng 02/14/2015 02/13/2015 01/31/2015  Glucose 70 - 140 mg/dl 124 149(H) 63(L)  BUN 7.0 - 26.0 mg/dL 45.4(H) 47(H) 62(H)  Creatinine 0.7 - 1.3 mg/dL 4.7(HH) 4.50(HH) 4.62(H)  Sodium 136 - 145 mEq/L 139 136 141  Potassium 3.5 - 5.1 mEq/L 3.9 3.7 3.8  Chloride 96 - 112 mEq/L - 100 102  CO2 22 - 29 mEq/L _1 Calcium 8.4 - 10.4 mg/dL 12.8(H) 12.9(H) 11.8(H)  Total Protein 6.4 - 8.3 g/dL 7.4 - -  Total Bilirubin 0.20 - 1.20 mg/dL 1.17 - -  Alkaline Phos 40 - 150 U/L 193(H) - -  AST 5  - 34 U/L 27 - -  ALT 0 - 55 U/L 18 - -   PATHOLOGY REPORT: Diagnosis 4//2016 Bone Marrow, Aspirate,Biopsy, and Clot, right ilium - SLIGHTLY HYPERCELLULAR BONE MARROW FOR AGE WITH TRILINEAGE HEMATOPOIESIS. - SEVERAL GRANULOMATA PRESENT. - SEE COMMENT.  PERIPHERAL BLOOD: - NORMOCYTIC-NORMOCHROMIC ANEMIA. - LYMPHOPENIA. Diagnosis Note The bone marrow is slightly hypercellular for age with trilineage hematopoiesis and nonspecific changes. Significant dyspoiesis is not present. The clot and core biopsy sections show several variably sized but predominantly small epithelioid granulomata with lack of necrosis. Special stains for microorganisms including AFB and GMS are negative. In this setting, the differential diagnosis includes infection, medication, autoimmune disease, sarcoidosis, etc. Clinical correlation is strongly recommended. (BNS:ecj 02/02/2015)  Cytogenetic: normal   RADIOGRAPHIC STUDIES: I have personally reviewed the radiological images as listed and agreed with the findings in the report.  Ct Chest, Abdomen Pelvis Wo Contrast 03/14/2015    IMPRESSION: Mildly prominent thoracic lymph nodes, likely reactive. No suspicious abdominopelvic lymphadenopathy.  Spleen is normal in size.  Cardiomegaly. Small right pleural effusion.  Cholelithiasis, without evidence of acute cholecystitis.  Bilateral renal cysts, including a 12 mm hemorrhagic cyst in the right lower pole.  Prostatomegaly with a thick-walled bladder, possibly reflecting chronic bladder outlet obstruction.  Additional ancillary findings as above.   Electronically Signed   By: Julian Hy M.D.   On: 03/14/2015 09:27    ASSESSMENT & PLAN:  64 year old male with multiple comorbidities, including hypertension, diabetes, CHF, stroke, dementia, stage IV CKD, here for evaluation of his anemia.  1. Anemia of chronic disease, secondary to CKD -He had a normal CBC in June 2015, developed worsening anemia since then. His CBC today  showed a normal MCV, low reticulocyte count at 76,  This is consistent with normocytic hypo-productive anemia -His iron study showed ferritin 279, normal serum iron and TIBC, slightly low iron saturation, normal folic acid and E52 level, this is not consistent with nutritional anemia -his TSH, testosterone level, , SPEP/UPEP with immunofixation were unremarkable. erythropoietin level was 34.3, mild elevated. -I discussed his bone marrow biopsy, no evidence of marrow disease. -I think his anemia is mainly secondary to his stage IV chronic kidney disease, possible small component of iron deficiency.  -I discussed the treatment option of EPO injection. The benefit and risks, such as thrombosis, especially heart attack and stroke, we'll discussed with patient and his family in great details. He agrees to proceed. -He responded well to Aranesp 100 mcg injection 4 months ago, hemoglobin 11.3 today. We'll hold injection today -Repeat CBC and Aranesp injection every 2 months if hemoglobin less than 11  2. 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 -I discussed his CT scan results with him and his daughter. He has mild prominent mediastinal adenopathy, no other significant findings. I do not think she needs routine follow-up scan .   3. stage IV CKD -He is going to follow-up with his nephrologist here.. -He is planned to have a fistula in left arm put in in the near future.  4. Hypertension, diabetes, CHF, COPD,  history of stroke -He will continue follow-up with his primary care physician and other specialties. -He was on aspirin before, but was taken off, he was not sure the reason. -I encouraged him to discuss with primary care physician and his cardiologist and restart baby aspirin. I'm little concerned the risk of thrombosis when he is on Aranesp.  Plan -CBC every 2 months, Aranesp 113mg injection if Hb<11    -Return to clinic in 2 months  All q with any problems, questions or concerns. I spent 25 minutes counseling the patient face to face. The total time spent in the appointment was 55 minutes and  more than 50% was on counseling.     Truitt Merle, MD 03/22/2015 9:25 AM

## 2015-03-26 ENCOUNTER — Ambulatory Visit (HOSPITAL_COMMUNITY): Payer: Medicare HMO

## 2015-03-28 ENCOUNTER — Ambulatory Visit (HOSPITAL_COMMUNITY): Payer: Medicare HMO

## 2015-04-05 ENCOUNTER — Other Ambulatory Visit: Payer: Self-pay | Admitting: Internal Medicine

## 2015-04-27 DIAGNOSIS — Z8674 Personal history of sudden cardiac arrest: Secondary | ICD-10-CM

## 2015-04-27 DIAGNOSIS — I219 Acute myocardial infarction, unspecified: Secondary | ICD-10-CM

## 2015-04-27 HISTORY — PX: CARDIAC CATHETERIZATION: SHX172

## 2015-04-27 HISTORY — DX: Acute myocardial infarction, unspecified: I21.9

## 2015-04-27 HISTORY — DX: Personal history of sudden cardiac arrest: Z86.74

## 2015-05-03 ENCOUNTER — Ambulatory Visit (INDEPENDENT_AMBULATORY_CARE_PROVIDER_SITE_OTHER): Payer: Medicare PPO | Admitting: Ophthalmology

## 2015-05-09 ENCOUNTER — Ambulatory Visit (INDEPENDENT_AMBULATORY_CARE_PROVIDER_SITE_OTHER): Payer: Medicare PPO | Admitting: Ophthalmology

## 2015-05-20 LAB — CBC AND DIFFERENTIAL
HCT: 29 % — AB (ref 41–53)
Hemoglobin: 9.3 g/dL — AB (ref 13.5–17.5)
PLATELETS: 173 10*3/uL (ref 150–399)
WBC: 4.3 10^3/mL

## 2015-05-20 LAB — BASIC METABOLIC PANEL
BUN: 35 mg/dL — AB (ref 4–21)
Creatinine: 5.7 mg/dL — AB (ref <=1.3)
Glucose: 108 mg/dL
Potassium: 5 mmol/L (ref 3.4–5.3)
SODIUM: 134 mmol/L — AB (ref 137–147)

## 2015-05-23 ENCOUNTER — Non-Acute Institutional Stay: Payer: Medicare HMO | Admitting: Adult Health

## 2015-05-23 ENCOUNTER — Ambulatory Visit (INDEPENDENT_AMBULATORY_CARE_PROVIDER_SITE_OTHER): Payer: Medicare PPO | Admitting: Ophthalmology

## 2015-05-23 DIAGNOSIS — D638 Anemia in other chronic diseases classified elsewhere: Secondary | ICD-10-CM

## 2015-05-23 DIAGNOSIS — R5381 Other malaise: Secondary | ICD-10-CM

## 2015-05-23 DIAGNOSIS — F329 Major depressive disorder, single episode, unspecified: Secondary | ICD-10-CM | POA: Diagnosis not present

## 2015-05-23 DIAGNOSIS — N186 End stage renal disease: Secondary | ICD-10-CM

## 2015-05-23 DIAGNOSIS — E1129 Type 2 diabetes mellitus with other diabetic kidney complication: Secondary | ICD-10-CM | POA: Diagnosis not present

## 2015-05-23 DIAGNOSIS — I469 Cardiac arrest, cause unspecified: Secondary | ICD-10-CM | POA: Diagnosis not present

## 2015-05-23 DIAGNOSIS — K5900 Constipation, unspecified: Secondary | ICD-10-CM | POA: Diagnosis not present

## 2015-05-23 DIAGNOSIS — F32A Depression, unspecified: Secondary | ICD-10-CM

## 2015-05-23 DIAGNOSIS — I4891 Unspecified atrial fibrillation: Secondary | ICD-10-CM

## 2015-05-23 DIAGNOSIS — G629 Polyneuropathy, unspecified: Secondary | ICD-10-CM

## 2015-05-23 DIAGNOSIS — E1165 Type 2 diabetes mellitus with hyperglycemia: Secondary | ICD-10-CM | POA: Diagnosis not present

## 2015-05-23 DIAGNOSIS — I2510 Atherosclerotic heart disease of native coronary artery without angina pectoris: Secondary | ICD-10-CM | POA: Diagnosis not present

## 2015-05-23 DIAGNOSIS — IMO0002 Reserved for concepts with insufficient information to code with codable children: Secondary | ICD-10-CM

## 2015-05-23 DIAGNOSIS — I5042 Chronic combined systolic (congestive) and diastolic (congestive) heart failure: Secondary | ICD-10-CM

## 2015-05-24 ENCOUNTER — Encounter: Payer: Self-pay | Admitting: Adult Health

## 2015-05-24 ENCOUNTER — Telehealth: Payer: Self-pay | Admitting: *Deleted

## 2015-05-24 ENCOUNTER — Ambulatory Visit: Payer: Medicare HMO

## 2015-05-24 DIAGNOSIS — G629 Polyneuropathy, unspecified: Secondary | ICD-10-CM | POA: Insufficient documentation

## 2015-05-24 DIAGNOSIS — I4891 Unspecified atrial fibrillation: Secondary | ICD-10-CM | POA: Insufficient documentation

## 2015-05-24 DIAGNOSIS — N186 End stage renal disease: Secondary | ICD-10-CM | POA: Insufficient documentation

## 2015-05-24 LAB — CBC AND DIFFERENTIAL
HEMATOCRIT: 30 % — AB (ref 41–53)
Hemoglobin: 9.5 g/dL — AB (ref 13.5–17.5)
Platelets: 203 10*3/uL (ref 150–399)
WBC: 5.2 10^3/mL

## 2015-05-24 LAB — BASIC METABOLIC PANEL
BUN: 44 mg/dL — AB (ref 4–21)
Creatinine: 5.9 mg/dL — AB (ref 0.6–1.3)
GLUCOSE: 116 mg/dL
Potassium: 5 mmol/L (ref 3.4–5.3)
SODIUM: 132 mmol/L — AB (ref 137–147)

## 2015-05-24 NOTE — Progress Notes (Signed)
Patient ID: Tony Freeman, male   DOB: 1951-09-05, 64 y.o.   MRN: [id number]    DATE:  05/23/15 MRN:  [id number]  BIRTHDAY: 08/15/1951  Facility:  [facility] Home Location:  [place] Room Number: 1001-1  LEVEL OF CARE:  SNF [phone])  Contact Information    Name Relation Home Work Maili Daughter  [phone]   Tony Freeman, Tony Freeman [phone]  [phone]   Tony Freeman [phone]  [phone]      Chief Complaint  Patient presents with  . Hospitalization Follow-up    Physical deconditioning, S/P PEA, CAD, CHF, ESRD, diabetes mellitus, neuropathy, atrial fibrillation, constipation, anemia and depression    HISTORY OF PRESENT ILLNESS:  This is a 64 year old male who was been admitted to [facility] on 05/22/15 from [facility] in [place], [place]. He suffered cardiac arrest at [place] after returning from visiting his daughter in [place]. CPR was done immediately by the bystanders until EMS arrived. AED was applied however, no shocks were given. PEA was advised. He was intubated. During the transport patient had decreased heart rate and epinephrine infusion was started. He began following commands and oxygenating properly. He was extubated in the ED. He complained of mid sternal chest pain and noted to HR 25 in the monitor. He was given dobutamine drip which increased heart rate to 60. EKG showed SR. Biotronik programmed at 50/110.  VF therapy set to deliver at 200  40 J. Rep noted that histogram were flat when he atrial paces and updated to rate response.  He has been admitted for a short-term rehabilitation.  PAST MEDICAL HISTORY:  Past Medical History  Diagnosis Date  . Diabetes mellitus with nephropathy   . Systolic and diastolic CHF, chronic     a. Echo at [facility] 11/17/2013 Ef <25%, severe hypokinesis of LV, moderately dilated LA, grade IV/IV diastolic dysfunction with irreversible restrictive pattern  of mitral inflow, severe pulm HTN (RVSP >61mmHg), 1-2+ MR  b. Echo at [facility] 04/02/14 EF 25-30%, moderate concentric LVH, diffuse hypokinesis, moderately dilated LA, no significant MR observed  . CAD (coronary artery disease)     a. PCI LAD/RCA in 2007  b. PCI LAD/RCA in 2009 at [facility]  c. PCI LAD 08/2013, CTO of RCA and nonobstructive disease in the [facility].  Marland Kitchen Hyperlipidemia   . Stroke   . Dementia   . Ischemic cardiomyopathy     a. s/p Biotronik ICD 06/2014.  Marland Kitchen Retinopathy     bilateral  . CKD (chronic kidney disease), stage IV   . HTN (hypertension)   . Abnormal renal ultrasound     a. 03/2014: Abnormal renal ultrasound with left renal lesion and bladder wall thickening and microscopic hematuria. Multiple cysts on prior MRI. Given inability to use contrast, f/u imaging limited, repeat US 3 months with outpatient uro f/u.  Marland Kitchen Paroxysmal atrial fibrillation   . Paroxysmal atrial flutter      CURRENT MEDICATIONS: Reviewed  Patient's Medications  New Prescriptions   No medications on file  Previous Medications   ACCU-CHEK SMARTVIEW TEST STRIP       ALBUTEROL (PROVENTIL HFA;VENTOLIN HFA) 108 (90 BASE) MCG/ACT INHALER    Inhale 2 puffs into the lungs every 6 (six) hours as needed for wheezing or shortness of breath.   ALBUTEROL (PROVENTIL) (2.5 MG/3ML) 0.083% NEBULIZER SOLUTION    Take 2.5 mg by nebulization every 6 (six) hours as needed for wheezing or shortness of breath.  ALCOHOL SWABS (B-D SINGLE USE SWABS REGULAR) PADS       AMIODARONE (PACERONE) 100 MG TABLET    Take 100 mg by mouth daily.   ASPIRIN EC 81 MG TABLET    Take 81 mg by mouth daily.   ATORVASTATIN (LIPITOR) 40 MG TABLET    Take 1 tablet (40 mg total) by mouth daily.   B COMPLEX-C-FOLIC ACID (NEPHRO-VITE PO)    Take 1 tablet by mouth daily.   BLOOD GLUCOSE CALIBRATION (ACCU-CHEK SMARTVIEW CONTROL) LIQD    USE TO CALIBRATE GLUCOSE MONITOR AS DIRECTED BY MANUFACTURER   BLOOD GLUCOSE CALIBRATION (ACCU-CHEK SMARTVIEW  CONTROL) LIQD       CARVEDILOL (COREG) 6.25 MG TABLET    Take 6.25 mg by mouth 2 (two) times daily with a meal.   CLOPIDOGREL (PLAVIX) 75 MG TABLET    Take 75 mg by mouth daily. X 30 days   DOCUSATE SODIUM (COLACE) 100 MG CAPSULE    Take 100 mg by mouth 2 (two) times daily.   GABAPENTIN (NEURONTIN) 100 MG CAPSULE    Take 100 mg by mouth daily as needed.   INSULIN ASPART (NOVOLOG) 100 UNIT/ML INJECTION    Inject 10 Units into the skin 3 (three) times daily with meals.   LISINOPRIL (PRINIVIL,ZESTRIL) 5 MG TABLET    Take 5 mg by mouth daily.   MIRTAZAPINE (REMERON) 7.5 MG TABLET    Take 7.5 mg by mouth.   MOMETASONE-FORMOTEROL (DULERA) 200-5 MCG/ACT AERO    Inhale 2 puffs into the lungs 2 (two) times daily.   POLYETHYLENE GLYCOL (MIRALAX / GLYCOLAX) PACKET    Take 17 g by mouth daily.  Modified Medications   No medications on file  Discontinued Medications   AMIODARONE (PACERONE) 200 MG TABLET    Take 100 mg by mouth daily.   BUMETANIDE (BUMEX) 1 MG TABLET    Take 1 tablet (1 mg total) by mouth 2 (two) times daily.   CHOLECALCIFEROL (VITAMIN D) 1000 UNITS TABLET    Take 2,000 Units by mouth daily.   EDOXABAN (SAVAYSA) 30 MG TABS TABLET    Take 1 tablet (30 mg total) by mouth daily.   FERROUS SULFATE 325 (65 FE) MG TABLET    Take 325 mg by mouth daily with breakfast.    HYDRALAZINE (APRESOLINE) 50 MG TABLET    Take 1.5 tablets (75 mg total) by mouth 3 (three) times daily.   INSULIN GLARGINE (LANTUS) 100 UNIT/ML INJECTION    Inject 0.25 mLs (25 Units total) into the skin at bedtime.   ISOSORBIDE MONONITRATE (IMDUR) 60 MG 24 HR TABLET    Take 60 mg by mouth daily.   MULTIPLE VITAMIN (MULTIVITAMIN WITH MINERALS) TABS TABLET    Take 1 tablet by mouth daily.   POTASSIUM CHLORIDE (K-DUR) 10 MEQ TABLET    Take 2 tablets (20 mEq total) by mouth daily.     No Known Allergies   REVIEW OF SYSTEMS:  GENERAL: no change in appetite, no fatigue, no weight changes, no fever, chills or weakness SKIN:  Denies rash, itching, wounds, ulcer sores, or nail abnormality EYES: Denies change in vision, dry eyes, eye pain, itching or discharge EARS: Denies change in hearing, ringing in ears, or earache NOSE: Denies nasal congestion or epistaxis MOUTH and THROAT: Denies oral discomfort, gingival pain or bleeding, pain from teeth or hoarseness   RESPIRATORY: no cough, SOB, DOE, wheezing, hemoptysis CARDIAC: no chest pain or palpitations GI: no abdominal pain, diarrhea, constipation, heart burn, nausea or  vomiting PSYCHIATRIC: Denies feeling of depression or anxiety. No report of hallucinations, insomnia, paranoia, or agitation   PHYSICAL EXAMINATION  GENERAL APPEARANCE: Well nourished. In no acute distress. Normal body habitus SKIN:  Skin is warm and dry. There are no suspicious lesions or rash HEAD: Normal in size and contour. No evidence of trauma EYES: Lids open and close normally. No blepharitis, entropion or ectropion. PERRL. Conjunctivae are clear and sclerae are white. Lenses are without opacity EARS: Pinnae are normal. Patient hears normal voice tunes of the examiner MOUTH and THROAT: Lips are without lesions. Oral mucosa is moist and without lesions. Tongue is normal in shape, size, and color and without lesions NECK: supple, trachea midline, no neck masses, no thyroid tenderness, no thyromegaly LYMPHATICS: no LAN in the neck, no supraclavicular LAN RESPIRATORY: breathing is even & unlabored, BS CTAB CARDIAC: RRR, no murmur,no extra heart sounds, BLE edema 2+; right chest perm cath; left chest pacemaker and defibrillator GI: abdomen soft, normal BS, no masses, no tenderness, no hepatomegaly, no splenomegaly EXTREMITIES:  Able to move X 4 extremities NEUROLOGICAL: There is no tremor. Speech is clear PSYCHIATRIC: Alert and oriented X 3. Affect and behavior are appropriate  LABS/RADIOLOGY: Labs reviewed: Basic Metabolic Panel:  Recent Labs  07/19/14 0915  07/23/14 0351  07/29/14 0348   01/26/15 0603 01/31/15 0900 02/13/15 1401 02/14/15 0911 03/22/15 0908 05/20/15  NA 140  < > 134*  < > 138  < > 137 141 136 139 138 134*  K 4.0  < > 4.2  < > 3.2*  < > 4.1 3.8 3.7 3.9 4.1 5.0  CL 99  < > 94*  < > 91*  < > 105 102 100  --   --   --   CO2 29  < > 24  < > 33*  < > 25 29 31 25 26   --   GLUCOSE 188*  < > 207*  < > 107*  < > 53* 63* 149* 124 251*  --   BUN 52*  < > 81*  < > 66*  < > 80* 62* 47* 45.4* 48.6* 35*  CREATININE 2.77*  < > 3.15*  < > 3.47*  < > 5.19* 4.62* 4.50* 4.7* 4.1* 5.7*  CALCIUM 9.4  < > 9.3  < > 9.3  < > 10.1 11.8* 12.9* 12.8* 13.8*  --   MG 2.1  --  2.3  --  2.1  --   --   --   --   --   --   --   < > = values in this interval not displayed. Liver Function Tests:  Recent Labs  01/26/15 0603 02/14/15 0911 03/22/15 0908  AST 36 27 26  ALT 20 18 19   ALKPHOS 169* 193* 208*  BILITOT 1.3* 1.17 1.35*  PROT 6.6 7.4 8.1  ALBUMIN 3.1* 3.6 3.8   CBC:  Recent Labs  01/31/15 0900 02/14/15 0911 03/22/15 0908 05/20/15  WBC 6.2 5.0 5.5 4.3  NEUTROABS 4.2 3.1 3.7  --   HGB 9.0* 9.4* 11.3* 9.3*  HCT 28.8* 29.4* 34.6* 29*  MCV 96.6 93.4 94.8  --   PLT 188 156 172 173   Lipid Panel:  Recent Labs  07/17/14 0350  HDL 27*   Cardiac Enzymes:  Recent Labs  07/17/14 0350 07/17/14 1108 07/20/14 0945  TROPONINI <0.30 <0.30 <0.30   CBG:  Recent Labs  01/31/15 1018 01/31/15 1103 01/31/15 1235  GLUCAP 47* 85 61*  ASSESSMENT/PLAN:  Physical deconditioning - for rehabilitation  PEA Arrest - no shocks from AED; Biotronik rate programmed at 500/110.  VF therapy set to deliver at 200 40  J. Rep noticed that histograms were flat when he atrial paces and updated the rate response;   CAD S/P multiple stents - he had cardiac catheterization. He had to LAD lesions, the proximal lesion was treated with balloon angioplasty and a distal lesion was left alone; continue Plavix 75 mg by mouth daily 30 days and aspirin 81 mg by mouth daily; discontinue  hydralazine and nitrites; continue lisinopril 5 mg by mouth, atorvastatin 40 mg by mouth daily daily and carvedilol 6.25 mg by mouth twice a day  ESRD - continue hemodialysis  Diabetes mellitus with renal complications - discontinue Lantus and continue NovoLog 10 units subcutaneous 3 times a day before meals  Neuropathy - continue Neurontin 100 mg by mouth daily when necessary  Atrial fibrillation - continue Coumadin 5 mg by mouth daily, Pacerone 100 mg by mouth daily and discontinue Savaysa (due to contraindication  Constipation - continue Colace 100 mg by mouth twice a day  Anemia of chronic disease - hemoglobin 9.3; check CBC  Depression - continue Remeron 7.5 mg by mouth daily at bedtime  Protein calorie malnutrition - albumin 3.0; RD consult    Goals of care:  Short-term rehabilitation    [facility], [facility] [phone]

## 2015-05-24 NOTE — Telephone Encounter (Signed)
Called patient at home and left message to callback and reschedule missed appts.

## 2015-05-25 ENCOUNTER — Non-Acute Institutional Stay: Payer: Medicare HMO | Admitting: Internal Medicine

## 2015-05-25 DIAGNOSIS — E46 Unspecified protein-calorie malnutrition: Secondary | ICD-10-CM

## 2015-05-25 DIAGNOSIS — N186 End stage renal disease: Secondary | ICD-10-CM

## 2015-05-25 DIAGNOSIS — I2510 Atherosclerotic heart disease of native coronary artery without angina pectoris: Secondary | ICD-10-CM | POA: Diagnosis not present

## 2015-05-25 DIAGNOSIS — D638 Anemia in other chronic diseases classified elsewhere: Secondary | ICD-10-CM | POA: Diagnosis not present

## 2015-05-25 DIAGNOSIS — I5022 Chronic systolic (congestive) heart failure: Secondary | ICD-10-CM | POA: Diagnosis not present

## 2015-05-25 DIAGNOSIS — R5381 Other malaise: Secondary | ICD-10-CM | POA: Diagnosis not present

## 2015-05-25 DIAGNOSIS — E1165 Type 2 diabetes mellitus with hyperglycemia: Secondary | ICD-10-CM

## 2015-05-25 DIAGNOSIS — G629 Polyneuropathy, unspecified: Secondary | ICD-10-CM

## 2015-05-25 DIAGNOSIS — I4891 Unspecified atrial fibrillation: Secondary | ICD-10-CM | POA: Diagnosis not present

## 2015-05-25 DIAGNOSIS — E1129 Type 2 diabetes mellitus with other diabetic kidney complication: Secondary | ICD-10-CM | POA: Diagnosis not present

## 2015-05-25 DIAGNOSIS — K5900 Constipation, unspecified: Secondary | ICD-10-CM

## 2015-05-25 DIAGNOSIS — R0789 Other chest pain: Secondary | ICD-10-CM

## 2015-05-25 DIAGNOSIS — E785 Hyperlipidemia, unspecified: Secondary | ICD-10-CM | POA: Insufficient documentation

## 2015-05-25 DIAGNOSIS — IMO0002 Reserved for concepts with insufficient information to code with codable children: Secondary | ICD-10-CM

## 2015-05-25 NOTE — Progress Notes (Signed)
Patient ID: Tony Freeman, male   DOB: 05-16-1951, 64 y.o.   MRN: [id number]      [facility]   PCP: PERROTT, Renato Gails, MD  Code Status: full code  No Known Allergies  Chief Complaint  Patient presents with  . New Admit To SNF     HPI:  64 y.o. patient is here for short term rehabilitation post hospital admission from 05/05/15-05/22/15 with PEA arrest. He had CPR, intubation and had return of spontaneous circulation in 20 minute. Interrogation of his [employer] showed no ICD shocks. The device was reset. He then underwent cardiac catheterization and 2 LAD lesions were noted. He underwent balloon angioplasty of the proximal lesion and was put on aspirin and plavix. He had decrease in urine output and required hemodialysis. He was also noted to have some short term memory loss thought to be from cardiac arrest and uremia. He is seen in his room today.He has PMH of ckd stage 4, CAD, systolic heart failure, AICD/ pacemaker and DM among others. He complaints of some chest soreness. He still has weakness. Denies any other concerns.  Review of Systems:  Constitutional: Negative for fever, chills, diaphoresis.  HENT: Negative for headache, congestion, nasal discharge Eyes: Negative for eye pain, blurred vision, double vision and discharge.  Respiratory: Negative for shortness of breath and wheezing.  has occasional dry cough Cardiovascular: Negative for chest pain, palpitations. Positive for leg swelling.  Gastrointestinal: Negative for heartburn, nausea, vomiting, abdominal pain. Appetite is fair. Had bowel movement today. Genitourinary: Negative for dysuria and flank pain.  Musculoskeletal: Negative for falls. Positive for back pain Skin: Negative for itching, rash.  Neurological: positive for dizziness with change of position. Negative for tingling, focal weakness Psychiatric/Behavioral: Negative for depression   Past Medical History  Diagnosis  Date  . Diabetes mellitus with nephropathy   . Systolic and diastolic CHF, chronic     a. Echo at [facility] 11/17/2013 Ef <25%, severe hypokinesis of LV, moderately dilated LA, grade IV/IV diastolic dysfunction with irreversible restrictive pattern of mitral inflow, severe pulm HTN (RVSP >13mmHg), 1-2+ MR  b. Echo at [facility] 04/02/14 EF 25-30%, moderate concentric LVH, diffuse hypokinesis, moderately dilated LA, no significant MR observed  . CAD (coronary artery disease)     a. PCI LAD/RCA in 2007  b. PCI LAD/RCA in 2009 at [facility]  c. PCI LAD 08/2013, CTO of RCA and nonobstructive disease in the LCx.  Marland Kitchen Hyperlipidemia   . Stroke   . Dementia   . Ischemic cardiomyopathy     a. s/p Biotronik ICD 06/2014.  Marland Kitchen Retinopathy     bilateral  . CKD (chronic kidney disease), stage IV   . HTN (hypertension)   . Abnormal renal ultrasound     a. 03/2014: Abnormal renal ultrasound with left renal lesion and bladder wall thickening and microscopic hematuria. Multiple cysts on prior MRI. Given inability to use contrast, f/u imaging limited, repeat US 3 months with outpatient uro f/u.  Marland Kitchen Paroxysmal atrial fibrillation   . Paroxysmal atrial flutter    Past Surgical History  Procedure Laterality Date  . Coronary stent placement      Multivessel coronary intervention in 2007 and 2009. Ischemic cardiomyopathyPCI LAD/ RCA in 2007 by Dr. Dwyane Dee. PCI LAD/RCA in 2009 at [facility]; PCI LAD 08/2013. Presented with unstable angina pectoris.. Coronary angio demonstrated proximal and distal LAD lesions.64/20 PROMUS stent in proximal LAD// PTCA of distal LAD  . Cardiac defibrillator placement    .  Spine surgery      anterior fusion  . Hernia repair    . Tee without cardioversion N/A 07/26/2014    Procedure: TRANSESOPHAGEAL ECHOCARDIOGRAM (TEE);  Surgeon: Larey Dresser, MD;  Location: [place];  Service: Cardiovascular;  Laterality: N/A;  . Cardioversion N/A 07/26/2014    Procedure: CARDIOVERSION;  Surgeon:  Larey Dresser, MD;  Location: [place];  Service: Cardiovascular;  Laterality: N/A;  . Right heart catheterization N/A 07/25/2014    Procedure: RIGHT HEART CATH;  Surgeon: Jolaine Artist, MD;  Location: [facility] CATH LAB;  Service: Cardiovascular;  Laterality: N/A;  . Cardiac defibrillator placement  06/2014   Social History:   reports that he has never smoked. He does not have any smokeless tobacco history on file. He reports that he does not drink alcohol or use illicit drugs.  Family History  Problem Relation Age of Onset  . Heart disease      brother  . Diabetes Mother   . Cancer Father     brother  . Kidney disease      Medications:   Medication List       This list is accurate as of: 05/25/15 11:48 AM.  Always use your most recent med list.               ACCU-CHEK SMARTVIEW CONTROL Liqd  USE TO CALIBRATE GLUCOSE MONITOR AS DIRECTED BY MANUFACTURER     ACCU-CHEK SMARTVIEW CONTROL Liqd     ACCU-CHEK SMARTVIEW test strip  Generic drug:  glucose blood     albuterol 108 (90 BASE) MCG/ACT inhaler  Commonly known as:  PROVENTIL HFA;VENTOLIN HFA  Inhale 2 puffs into the lungs every 6 (six) hours as needed for wheezing or shortness of breath.     albuterol (2.5 MG/3ML) 0.083% nebulizer solution  Commonly known as:  PROVENTIL  Take 2.5 mg by nebulization every 6 (six) hours as needed for wheezing or shortness of breath.     amiodarone 100 MG tablet  Commonly known as:  PACERONE  Take 100 mg by mouth daily.     aspirin EC 81 MG tablet  Take 81 mg by mouth daily.     atorvastatin 40 MG tablet  Commonly known as:  LIPITOR  Take 1 tablet (40 mg total) by mouth daily.     B-D SINGLE USE SWABS REGULAR Pads     carvedilol 6.25 MG tablet  Commonly known as:  COREG  Take 6.25 mg by mouth 2 (two) times daily with a meal.     clopidogrel 75 MG tablet  Commonly known as:  PLAVIX  Take 75 mg by mouth daily. X 30 days     docusate sodium 100 MG capsule  Commonly  known as:  COLACE  Take 100 mg by mouth 2 (two) times daily.     DULERA 200-5 MCG/ACT Aero  Generic drug:  mometasone-formoterol  Inhale 2 puffs into the lungs 2 (two) times daily.     gabapentin 100 MG capsule  Commonly known as:  NEURONTIN  Take 100 mg by mouth daily as needed.     insulin aspart 100 UNIT/ML injection  Commonly known as:  novoLOG  Inject 10 Units into the skin 3 (three) times daily with meals.     lisinopril 5 MG tablet  Commonly known as:  PRINIVIL,ZESTRIL  Take 5 mg by mouth daily.     mirtazapine 7.5 MG tablet  Commonly known as:  REMERON  Take 7.5 mg by mouth.  NEPHRO-VITE PO  Take 1 tablet by mouth daily.     polyethylene glycol packet  Commonly known as:  MIRALAX / GLYCOLAX  Take 17 g by mouth daily.         Physical Exam: Filed Vitals:   05/25/15 1147  BP: 128/69  Pulse: 63  Temp: 97 F (36.1 C)  Resp: 18  SpO2: 92%    General- elderly male, well built, in no acute distress Head- normocephalic, atraumatic Nose- no maxillary or frontal sinus tenderness, no nasal discharge Throat- moist mucus membrane, normal oropharynx, poor dentition Eyes- PERRLA, EOMI, no pallor, no icterus, no discharge, normal conjunctiva, normal sclera Neck- no cervical lymphadenopathy, no jugular vein distension Chest- no chest wall deformities, positive for chest wall tenderness Cardiovascular- normal s1,s2, no murmurs, palpable dorsalis pedis, 1+ leg edema Respiratory- bilateral clear to auscultation, no wheeze, no rhonchi, no crackles, no use of accessory muscles Abdomen- bowel sounds present, soft, non tender Musculoskeletal- able to move all 4 extremities, generalized weakness, no spinral or paravertebral tenderness Neurological- no focal deficit, alert and oriented to person and place Skin- warm and dry, right groin incision healing well, right chest dialysis catheter in place and site clean Psychiatry- normal mood and affect    Labs  reviewed: Basic Metabolic Panel:  Recent Labs  07/19/14 0915  07/23/14 0351  07/29/14 0348  01/26/15 0603 01/31/15 0900 02/13/15 1401 02/14/15 0911 03/22/15 0908 05/20/15  NA 140  < > 134*  < > 138  < > 137 141 136 139 138 134*  K 4.0  < > 4.2  < > 3.2*  < > 4.1 3.8 3.7 3.9 4.1 5.0  CL 99  < > 94*  < > 91*  < > 105 102 100  --   --   --   CO2 29  < > 24  < > 33*  < > 25 29 31 25 26   --   GLUCOSE 188*  < > 207*  < > 107*  < > 53* 63* 149* 124 251*  --   BUN 52*  < > 81*  < > 66*  < > 80* 62* 47* 45.4* 48.6* 35*  CREATININE 2.77*  < > 3.15*  < > 3.47*  < > 5.19* 4.62* 4.50* 4.7* 4.1* 5.7*  CALCIUM 9.4  < > 9.3  < > 9.3  < > 10.1 11.8* 12.9* 12.8* 13.8*  --   MG 2.1  --  2.3  --  2.1  --   --   --   --   --   --   --   < > = values in this interval not displayed. Liver Function Tests:  Recent Labs  01/26/15 0603 02/14/15 0911 03/22/15 0908  AST 36 27 26  ALT 20 18 19   ALKPHOS 169* 193* 208*  BILITOT 1.3* 1.17 1.35*  PROT 6.6 7.4 8.1  ALBUMIN 3.1* 3.6 3.8   No results for input(s): LIPASE, AMYLASE in the last 8760 hours. No results for input(s): AMMONIA in the last 8760 hours. CBC:  Recent Labs  01/31/15 0900 02/14/15 0911 03/22/15 0908 05/20/15  WBC 6.2 5.0 5.5 4.3  NEUTROABS 4.2 3.1 3.7  --   HGB 9.0* 9.4* 11.3* 9.3*  HCT 28.8* 29.4* 34.6* 29*  MCV 96.6 93.4 94.8  --   PLT 188 156 172 173   Cardiac Enzymes:  Recent Labs  07/17/14 0350 07/17/14 1108 07/20/14 0945  TROPONINI <0.30 <0.30 <0.30   BNP: Invalid input(s):  POCBNP CBG:  Recent Labs  01/31/15 1018 01/31/15 1103 01/31/15 1235  GLUCAP 47* 85 61*    Radiological Exams: Ct Abdomen Pelvis Wo Contrast  03/14/2015   CLINICAL DATA:  Fatigue, weakness, shortness of breath, anemia.  EXAM: CT CHEST, ABDOMEN AND PELVIS WITHOUT CONTRAST  TECHNIQUE: Multidetector CT imaging of the chest, abdomen and pelvis was performed following the standard protocol without IV contrast.  COMPARISON:  None.   FINDINGS: CT CHEST FINDINGS  Mediastinum/Nodes: Cardiomegaly.  No pericardial effusion.  Coronary atherosclerosis. Coronary stents. Postsurgical changes related to prior CABG. Left subclavian ICD.  Small mediastinal lymph nodes, including an 8 mm short axis right paratracheal node (series 2/ image 19), a 10 mm short axis low right paratracheal node (series 2/ image 24), and a 13 mm short axis subcarinal node (series 2/ image 29).  Visualized thyroid is unremarkable.  Lungs/Pleura: Lungs are essentially clear.  Small right pleural effusion.  Mild centrilobular emphysematous changes.  No pneumothorax.  Musculoskeletal: Degenerative changes of the thoracic spine.  CT ABDOMEN PELVIS FINDINGS  Hepatobiliary: Unenhanced liver is unremarkable.  Gallbladder is notable for layering gallstones (series 2/image 66), without associated inflammatory changes.  Pancreas: Within normal limits.  Spleen: Within normal limits.  Adrenals/Urinary Tract: Adrenal glands are within normal limits.  Left kidney is notable for a 2.2 cm cyst in the lateral left upper pole (series 2/image 65). Right kidney is notable for for cysts measuring up to 3.3 cm in the posterior right upper pole, including a 1.2 cm hyperdense/hemorrhagic cyst in the right lower pole (series 2/ image 76).  No renal, ureteral, or bladder calculi.  Thick-walled bladder.  Stomach/Bowel: Stomach is within normal limits.  No evidence of bowel obstruction.  Normal appendix.  Vascular/Lymphatic: Atherosclerotic calcifications of the abdominal aorta and branch vessels.  No suspicious abdominopelvic lymphadenopathy.  Reproductive: Prostatomegaly, with enlargement of the central gland which indents the base of the bladder.  Other: No abdominopelvic ascites.  Musculoskeletal: Degenerative changes of the lumbar spine.  IMPRESSION: Mildly prominent thoracic lymph nodes, likely reactive. No suspicious abdominopelvic lymphadenopathy.  Spleen is normal in size.  Cardiomegaly. Small right  pleural effusion.  Cholelithiasis, without evidence of acute cholecystitis.  Bilateral renal cysts, including a 12 mm hemorrhagic cyst in the right lower pole.  Prostatomegaly with a thick-walled bladder, possibly reflecting chronic bladder outlet obstruction.  Additional ancillary findings as above.   Electronically Signed   By: Julian Hy M.D.   On: 03/14/2015 09:27   Ct Chest Wo Contrast  03/14/2015   CLINICAL DATA:  Fatigue, weakness, shortness of breath, anemia.  EXAM: CT CHEST, ABDOMEN AND PELVIS WITHOUT CONTRAST  TECHNIQUE: Multidetector CT imaging of the chest, abdomen and pelvis was performed following the standard protocol without IV contrast.  COMPARISON:  None.  FINDINGS: CT CHEST FINDINGS  Mediastinum/Nodes: Cardiomegaly.  No pericardial effusion.  Coronary atherosclerosis. Coronary stents. Postsurgical changes related to prior CABG. Left subclavian ICD.  Small mediastinal lymph nodes, including an 8 mm short axis right paratracheal node (series 2/ image 19), a 10 mm short axis low right paratracheal node (series 2/ image 24), and a 13 mm short axis subcarinal node (series 2/ image 29).  Visualized thyroid is unremarkable.  Lungs/Pleura: Lungs are essentially clear.  Small right pleural effusion.  Mild centrilobular emphysematous changes.  No pneumothorax.  Musculoskeletal: Degenerative changes of the thoracic spine.  CT ABDOMEN PELVIS FINDINGS  Hepatobiliary: Unenhanced liver is unremarkable.  Gallbladder is notable for layering gallstones (series 2/image 66), without associated  inflammatory changes.  Pancreas: Within normal limits.  Spleen: Within normal limits.  Adrenals/Urinary Tract: Adrenal glands are within normal limits.  Left kidney is notable for a 2.2 cm cyst in the lateral left upper pole (series 2/image 65). Right kidney is notable for for cysts measuring up to 3.3 cm in the posterior right upper pole, including a 1.2 cm hyperdense/hemorrhagic cyst in the right lower pole (series  2/ image 76).  No renal, ureteral, or bladder calculi.  Thick-walled bladder.  Stomach/Bowel: Stomach is within normal limits.  No evidence of bowel obstruction.  Normal appendix.  Vascular/Lymphatic: Atherosclerotic calcifications of the abdominal aorta and branch vessels.  No suspicious abdominopelvic lymphadenopathy.  Reproductive: Prostatomegaly, with enlargement of the central gland which indents the base of the bladder.  Other: No abdominopelvic ascites.  Musculoskeletal: Degenerative changes of the lumbar spine.  IMPRESSION: Mildly prominent thoracic lymph nodes, likely reactive. No suspicious abdominopelvic lymphadenopathy.  Spleen is normal in size.  Cardiomegaly. Small right pleural effusion.  Cholelithiasis, without evidence of acute cholecystitis.  Bilateral renal cysts, including a 12 mm hemorrhagic cyst in the right lower pole.  Prostatomegaly with a thick-walled bladder, possibly reflecting chronic bladder outlet obstruction.  Additional ancillary findings as above.   Electronically Signed   By: Julian Hy M.D.   On: 03/14/2015 09:27     Assessment/Plan  Physical deconditioning Will have him work with physical therapy and occupational therapy team to help with gait training and muscle strengthening exercises.fall precautions. Skin care. Encourage to be out of bed.   CAD  S/p recent catheterization and balloon angioplasty of proximal LAD. Remains chest pain free. Continue plavix 75 mg daily for a month and continue his aspirin 81 mg daily with lisinopril 5 mg daily and carvedilol 6.25 mg bid. Continue statin  Chest soreness Post CPR, monitor clinically. Add tylenol 650 mg bid for 5 days, then q8h prn  ESRD Stable, continue hemodialysis and nephrovite, has f/u with renal   Diabetes mellitus with renal complications Lab Results  Component Value Date   HGBA1C 7.3* 01/24/2015  check a1c, currently on novolog 10 u tid. Continue lisinopril and statin  HLD Continue atorvastatin  40 mg daily  afib Rate controlled. Continue pacerone 100 mg daily, carvedilol 6.25 mg bid and coumadin 5 mg daily. Monitor inr with goal for 2-3  Anemia of chronic disease Hb 9.3, monitor h&h  Protein calorie malnutrition On remeron 7.5 mg daily. Monitor po intake. Monitor daily weight  Chronic systolic chf Appears euvolemic. Has leg edema. Leg elevation at rest. Daily weight. Continue carvedilol and lisinopril for now. Was on bumex and hydralazine before which has been discontinued by nephrology.   Constipation Stable. continue Colace 100 mg bid, miralax daily  Neuropathy continue Neurontin 100 mg daily prn and monitor   Goals of care: short term rehabilitation   Labs/tests ordered: cbc, cmp, a1c in 1 week  Family/ staff Communication: reviewed care plan with patient and nursing supervisor    Blanchie Serve, MD  [place] [phone] (Monday-Friday 8 am - 5 pm) [phone] (afterhours)

## 2015-06-01 ENCOUNTER — Other Ambulatory Visit: Payer: Self-pay | Admitting: *Deleted

## 2015-06-01 DIAGNOSIS — N185 Chronic kidney disease, stage 5: Secondary | ICD-10-CM

## 2015-06-01 DIAGNOSIS — Z01810 Encounter for preprocedural cardiovascular examination: Secondary | ICD-10-CM

## 2015-06-06 ENCOUNTER — Encounter: Payer: Self-pay | Admitting: *Deleted

## 2015-06-07 ENCOUNTER — Telehealth: Payer: Self-pay | Admitting: Cardiology

## 2015-06-07 NOTE — Telephone Encounter (Signed)
Patient is currentlty at [facility]. A condition for his discharge is that he will have an appt with Cardiology next week to follow up and discuss Cardiac Rehab. [facility]. Called [facility] [phone]). Heather scheduled him for August 23rd at 10:30a. Notified patient's son of appt time, date/time/place, and gate code at [facility]. Verbalized appreciation for setting up appointment so timely.

## 2015-06-07 NOTE — Telephone Encounter (Signed)
New Message      Pt's son calling stating that pt can't be released from rehab until he has an appt set up with our office. Offered Dr. Claris Gladden first available appt in Nov as well as first available PA appt in Sept, pt's son states that the pt needs to be seen in a couple of days. Please call back and advise.

## 2015-06-08 ENCOUNTER — Non-Acute Institutional Stay: Payer: Medicare HMO | Admitting: Adult Health

## 2015-06-08 ENCOUNTER — Encounter: Payer: Self-pay | Admitting: Adult Health

## 2015-06-08 DIAGNOSIS — E1165 Type 2 diabetes mellitus with hyperglycemia: Secondary | ICD-10-CM

## 2015-06-08 DIAGNOSIS — F32A Depression, unspecified: Secondary | ICD-10-CM

## 2015-06-08 DIAGNOSIS — J309 Allergic rhinitis, unspecified: Secondary | ICD-10-CM | POA: Diagnosis not present

## 2015-06-08 DIAGNOSIS — E46 Unspecified protein-calorie malnutrition: Secondary | ICD-10-CM

## 2015-06-08 DIAGNOSIS — K5900 Constipation, unspecified: Secondary | ICD-10-CM

## 2015-06-08 DIAGNOSIS — D638 Anemia in other chronic diseases classified elsewhere: Secondary | ICD-10-CM

## 2015-06-08 DIAGNOSIS — F329 Major depressive disorder, single episode, unspecified: Secondary | ICD-10-CM

## 2015-06-08 DIAGNOSIS — I4891 Unspecified atrial fibrillation: Secondary | ICD-10-CM

## 2015-06-08 DIAGNOSIS — I2510 Atherosclerotic heart disease of native coronary artery without angina pectoris: Secondary | ICD-10-CM | POA: Diagnosis not present

## 2015-06-08 DIAGNOSIS — N186 End stage renal disease: Secondary | ICD-10-CM

## 2015-06-08 DIAGNOSIS — E1129 Type 2 diabetes mellitus with other diabetic kidney complication: Secondary | ICD-10-CM

## 2015-06-08 DIAGNOSIS — I469 Cardiac arrest, cause unspecified: Secondary | ICD-10-CM | POA: Diagnosis not present

## 2015-06-08 DIAGNOSIS — G629 Polyneuropathy, unspecified: Secondary | ICD-10-CM | POA: Diagnosis not present

## 2015-06-08 DIAGNOSIS — IMO0002 Reserved for concepts with insufficient information to code with codable children: Secondary | ICD-10-CM

## 2015-06-12 NOTE — Progress Notes (Signed)
Patient ID: Tony Freeman, male   DOB: May 15, 1951, 64 y.o.   MRN: [id number]    DATE:  06/08/15 MRN:  [id number]  BIRTHDAY: 12-10-1950  Facility:  Nursing Home Location:  [place] [facility] [place] Room Number: 1001-1  LEVEL OF CARE:  [phone]  Contact Information    Name Relation Home Work [place] Daughter  [phone]   Evans, Levee [phone]  [phone]   Truitt Merle [phone]  [phone]      Chief Complaint  Patient presents with  . Discharge Note    S/P PEA, CAD, ESRD, protein-calorie malnutrition, diabetes mellitus, neuropathy, atrial fibrillation, constipation, allergic rhinitis, anemia and depression    HISTORY OF PRESENT ILLNESS:  This is a 64 year old male who is for discharge home. He has been admitted to [facility] on 05/22/15 from [facility] in [place], [place]. He suffered cardiac [place] at [place] airport after returning from visiting his daughter in [place]. CPR was done immediately by the bystanders until EMS arrived. AED was applied however, no shocks were given. PEA was advised. He was intubated. During the transport patient had decreased heart rate and epinephrine infusion was started. He began following commands and oxygenating properly. He was extubated in the ED. He complained of mid sternal chest pain and noted to HR 25 in the monitor. He was given dobutamine drip which increased heart rate to 60. EKG showed SR. Biotronik programmed at 50/110.  VF therapy set to deliver at 200  40 J. Rep noted that histogram were flat when he atrial paces and updated to rate response.  Patient was admitted to this facility for short-term rehabilitation after the patient's recent hospitalization.  Patient has completed SNF rehabilitation and therapy has cleared the patient for discharge.  PAST MEDICAL HISTORY:  Past Medical History  Diagnosis Date  . Diabetes mellitus with nephropathy   . Systolic and  diastolic CHF, chronic     a. Echo at [facility] 11/17/2013 Ef <25%, severe hypokinesis of LV, moderately dilated LA, grade IV/IV diastolic dysfunction with irreversible restrictive pattern of mitral inflow, severe pulm HTN (RVSP >25mmHg), 1-2+ MR  b. Echo at [facility] 04/02/14 EF 25-30%, moderate concentric LVH, diffuse hypokinesis, moderately dilated LA, no significant MR observed  . CAD (coronary artery disease)     a. PCI LAD/RCA in 2007  b. PCI LAD/RCA in 2009 at [facility]  c. PCI LAD 08/2013, CTO of RCA and nonobstructive disease in the LCx.  Marland Kitchen Hyperlipidemia   . Stroke   . Dementia   . Ischemic cardiomyopathy     a. s/p Biotronik ICD 06/2014.  Marland Kitchen Retinopathy     bilateral  . CKD (chronic kidney disease), stage IV   . HTN (hypertension)   . Abnormal renal ultrasound     a. 03/2014: Abnormal renal ultrasound with left renal lesion and bladder wall thickening and microscopic hematuria. Multiple cysts on prior MRI. Given inability to use contrast, f/u imaging limited, repeat US 3 months with outpatient uro f/u.  Marland Kitchen Paroxysmal atrial fibrillation   . Paroxysmal atrial flutter      CURRENT MEDICATIONS: Reviewed  Patient's Medications  New Prescriptions   No medications on file  Previous Medications   ACETAMINOPHEN (TYLENOL) 325 MG TABLET    Take two tablets by mouth every 8 hours as needed for pain   ALBUTEROL (PROVENTIL HFA;VENTOLIN HFA) 108 (90 BASE) MCG/ACT INHALER    Inhale 2 puffs into the lungs every 6 (six) hours as  needed for wheezing or shortness of breath.   ALBUTEROL (PROVENTIL) (2.5 MG/3ML) 0.083% NEBULIZER SOLUTION    Take 2.5 mg by nebulization every 6 (six) hours as needed for wheezing or shortness of breath.   AMIODARONE (PACERONE) 100 MG TABLET    Take one tablet by mouth once daily for AFIB   ASPIRIN EC 81 MG TABLET    Take 81 mg by mouth daily.   ATORVASTATIN (LIPITOR) 40 MG TABLET    Take 1 tablet (40 mg total) by mouth daily.   B COMPLEX-C-FOLIC ACID (NEPHRO-VITE PO)    Take  1 tablet by mouth daily.   CARVEDILOL (COREG) 6.25 MG TABLET    Take one tablet by mouth twice daily for hypertension   CLOPIDOGREL (PLAVIX) 75 MG TABLET    Take one tablet by mouth once daily for 30 days for CAD ends 8/26   DOCUSATE SODIUM (COLACE) 100 MG CAPSULE    Take 100 mg by mouth 2 (two) times daily.   INSULIN LISPRO (HUMALOG) 100 UNIT/ML INJECTION    Inject 10 Units into the skin 3 (three) times daily before meals. For BS > 140   LORATADINE (CLARITIN) 10 MG TABLET    Take one tablet by mouth once daily for allergies   MIRTAZAPINE (REMERON) 7.5 MG TABLET    Take one tablet by mouth every night at bedtime for sleep   OXYCODONE (ROXICODONE) 5 MG IMMEDIATE RELEASE TABLET    Take one tablet by mouth every 8 hours as needed for pain   POLYETHYLENE GLYCOL (MIRALAX / GLYCOLAX) PACKET    Mix 17gm in 4-6 oz of liquid daily for constipation   PROTEIN (PROCEL 100) POWD    Take 1 scoop by mouth 2 (two) times daily. For nutritional supplement  Modified Medications   No medications on file  Discontinued Medications   No medications on file     No Known Allergies   REVIEW OF SYSTEMS:  GENERAL: no change in appetite, no fatigue, no weight changes, no fever, chills or weakness SKIN: Denies rash, itching, wounds, ulcer sores, or nail abnormality EYES: Denies change in vision, dry eyes, eye pain, itching or discharge EARS: Denies change in hearing, ringing in ears, or earache NOSE: Denies nasal congestion or epistaxis MOUTH and THROAT: Denies oral discomfort, gingival pain or bleeding, pain from teeth or hoarseness   RESPIRATORY: no cough, SOB, DOE, wheezing, hemoptysis CARDIAC: no chest pain or palpitations GI: no abdominal pain, diarrhea, constipation, heart burn, nausea or vomiting PSYCHIATRIC: Denies feeling of depression or anxiety. No report of hallucinations, insomnia, paranoia, or agitation   PHYSICAL EXAMINATION  GENERAL APPEARANCE: Well nourished. In no acute distress. Normal body  habitus SKIN:  Skin is warm and dry. There are no suspicious lesions or rash HEAD: Normal in size and contour. No evidence of trauma EYES: Lids open and close normally. No blepharitis, entropion or ectropion. PERRL. Conjunctivae are clear and sclerae are white. Lenses are without opacity EARS: Pinnae are normal. Patient hears normal voice tunes of the examiner MOUTH and THROAT: Lips are without lesions. Oral mucosa is moist and without lesions. Tongue is normal in shape, size, and color and without lesions NECK: supple, trachea midline, no neck masses, no thyroid tenderness, no thyromegaly LYMPHATICS: no LAN in the neck, no supraclavicular LAN RESPIRATORY: breathing is even & unlabored, BS CTAB CARDIAC: RRR, no murmur,no extra heart sounds, BLE edema 1+; right chest perm cath; left chest pacemaker and defibrillator GI: abdomen soft, normal BS, no masses, no  tenderness, no hepatomegaly, no splenomegaly EXTREMITIES:  Able to move X 4 extremities; ambulatory NEUROLOGICAL: There is no tremor. Speech is clear PSYCHIATRIC: Alert and oriented X 3. Affect and behavior are appropriate  LABS/RADIOLOGY: Labs reviewed: Basic Metabolic Panel:  Recent Labs  07/19/14 0915  07/23/14 0351  07/29/14 0348  01/26/15 0603 01/31/15 0900 02/13/15 1401  02/14/15 0911 03/22/15 0908 05/20/15 05/24/15  NA 140  < > 134*  < > 138  < > 137 141 136  < > 139 138 134* 132*  K 4.0  < > 4.2  < > 3.2*  < > 4.1 3.8 3.7  < > 3.9 4.1 5.0 5.0  CL 99  < > 94*  < > 91*  < > 105 102 100  --   --   --   --   --   CO2 29  < > 24  < > 33*  < > 25 29 31   --  25 26  --   --   GLUCOSE 188*  < > 207*  < > 107*  < > 53* 63* 149*  --  124 251*  --   --   BUN 52*  < > 81*  < > 66*  < > 80* 62* 47*  < > 45.4* 48.6* 35* 44*  CREATININE 2.77*  < > 3.15*  < > 3.47*  < > 5.19* 4.62* 4.50*  < > 4.7* 4.1* 5.7* 5.9*  CALCIUM 9.4  < > 9.3  < > 9.3  < > 10.1 11.8* 12.9*  --  12.8* 13.8*  --   --   MG 2.1  --  2.3  --  2.1  --   --   --   --    --   --   --   --   --   < > = values in this interval not displayed. Liver Function Tests:  Recent Labs  01/26/15 0603 02/14/15 0911 03/22/15 0908  AST 36 27 26  ALT 20 18 19   ALKPHOS 169* 193* 208*  BILITOT 1.3* 1.17 1.35*  PROT 6.6 7.4 8.1  ALBUMIN 3.1* 3.6 3.8   CBC:  Recent Labs  01/31/15 0900 02/14/15 0911 03/22/15 0908 05/20/15 05/24/15  WBC 6.2 5.0 5.5 4.3 5.2  NEUTROABS 4.2 3.1 3.7  --   --   HGB 9.0* 9.4* 11.3* 9.3* 9.5*  HCT 28.8* 29.4* 34.6* 29* 30*  MCV 96.6 93.4 94.8  --   --   PLT 188 156 172 173 203   Lipid Panel:  Recent Labs  07/17/14 0350  HDL 27*   Cardiac Enzymes:  Recent Labs  07/17/14 0350 07/17/14 1108 07/20/14 0945  TROPONINI <0.30 <0.30 <0.30   CBG:  Recent Labs  01/31/15 1018 01/31/15 1103 01/31/15 1235  GLUCAP 47* 85 61*     ASSESSMENT/PLAN:  PEA Arrest - no shocks from AED; Biotronik rate programmed at 500/110.  VF therapy set to deliver at 200 40  J. Rep noticed that histograms were flat when he atrial paces and updated the rate response  CAD S/P multiple stents - he had cardiac catheterization. He had 2 LAD lesions, the proximal lesion was treated with balloon angioplasty and a distal lesion was left alone; continue Plavix 75 mg by mouth daily till 8/26, aspirin 81 mg by mouth daily;  lisinopril 5 mg by mouth, atorvastatin 40 mg by mouth daily daily and carvedilol 6.25 mg by mouth twice a day  ESRD - continue  hemodialysis  Diabetes mellitus with renal complications -  continue NovoLog 10 units subcutaneous 3 times a day before meals  Neuropathy - continue Neurontin 100 mg by mouth daily when necessary  Atrial fibrillation - continue Pacerone 100 mg by mouth   Constipation - continue Colace 100 mg by mouth twice a day and Miralax 17 gm daily  Anemia of chronic disease - hemoglobin 9.5; stable  Depression - continue Remeron 7.5 mg by mouth daily at bedtime  Protein calorie malnutrition - albumin 3.0; continue  supplementation  Allergic rhinitis - was recently started on Claritin 10 mg daily      I have filled out patient's discharge paperwork and written prescriptions.    Total discharge time: Less than 30 minutes  Discharge time involved coordination of the discharge process with Education officer, museum, nursing staff and therapy department.     [facility], NP [employer] [phone]

## 2015-06-19 ENCOUNTER — Encounter (HOSPITAL_COMMUNITY): Payer: Self-pay

## 2015-06-19 ENCOUNTER — Ambulatory Visit (HOSPITAL_COMMUNITY)
Admission: RE | Admit: 2015-06-19 | Discharge: 2015-06-19 | Disposition: A | Discharge status: Home / Self Care | Payer: Medicare HMO | Source: Ambulatory Visit | Attending: Cardiology | Admitting: Cardiology

## 2015-06-19 VITALS — BP 130/70 | HR 82 | Wt 182.0 lb

## 2015-06-19 DIAGNOSIS — E785 Hyperlipidemia, unspecified: Secondary | ICD-10-CM | POA: Diagnosis not present

## 2015-06-19 DIAGNOSIS — Z7982 Long term (current) use of aspirin: Secondary | ICD-10-CM | POA: Insufficient documentation

## 2015-06-19 DIAGNOSIS — Z79899 Other long term (current) drug therapy: Secondary | ICD-10-CM | POA: Insufficient documentation

## 2015-06-19 DIAGNOSIS — Z955 Presence of coronary angioplasty implant and graft: Secondary | ICD-10-CM | POA: Diagnosis not present

## 2015-06-19 DIAGNOSIS — Z794 Long term (current) use of insulin: Secondary | ICD-10-CM | POA: Diagnosis not present

## 2015-06-19 DIAGNOSIS — I120 Hypertensive chronic kidney disease with stage 5 chronic kidney disease or end stage renal disease: Secondary | ICD-10-CM | POA: Diagnosis not present

## 2015-06-19 DIAGNOSIS — I480 Paroxysmal atrial fibrillation: Secondary | ICD-10-CM

## 2015-06-19 DIAGNOSIS — I4891 Unspecified atrial fibrillation: Secondary | ICD-10-CM | POA: Diagnosis not present

## 2015-06-19 DIAGNOSIS — Z7902 Long term (current) use of antithrombotics/antiplatelets: Secondary | ICD-10-CM | POA: Insufficient documentation

## 2015-06-19 DIAGNOSIS — I255 Ischemic cardiomyopathy: Secondary | ICD-10-CM | POA: Insufficient documentation

## 2015-06-19 DIAGNOSIS — N186 End stage renal disease: Secondary | ICD-10-CM

## 2015-06-19 DIAGNOSIS — I2510 Atherosclerotic heart disease of native coronary artery without angina pectoris: Secondary | ICD-10-CM | POA: Diagnosis not present

## 2015-06-19 DIAGNOSIS — F0390 Unspecified dementia without behavioral disturbance: Secondary | ICD-10-CM | POA: Diagnosis not present

## 2015-06-19 DIAGNOSIS — E1121 Type 2 diabetes mellitus with diabetic nephropathy: Secondary | ICD-10-CM | POA: Diagnosis not present

## 2015-06-19 DIAGNOSIS — I5022 Chronic systolic (congestive) heart failure: Secondary | ICD-10-CM | POA: Diagnosis not present

## 2015-06-19 DIAGNOSIS — Z8673 Personal history of transient ischemic attack (TIA), and cerebral infarction without residual deficits: Secondary | ICD-10-CM | POA: Insufficient documentation

## 2015-06-19 DIAGNOSIS — Z95810 Presence of automatic (implantable) cardiac defibrillator: Secondary | ICD-10-CM | POA: Diagnosis not present

## 2015-06-19 LAB — HEPATIC FUNCTION PANEL
ALK PHOS: 168 U/L — AB (ref 38–126)
ALT: 16 U/L — AB (ref 17–63)
AST: 28 U/L (ref 15–41)
Albumin: 3.6 g/dL (ref 3.5–5.0)
BILIRUBIN DIRECT: 0.7 mg/dL — AB (ref 0.1–0.5)
BILIRUBIN INDIRECT: 1 mg/dL — AB (ref 0.3–0.9)
BILIRUBIN TOTAL: 1.7 mg/dL — AB (ref 0.3–1.2)
Total Protein: 7.4 g/dL (ref 6.5–8.1)

## 2015-06-19 LAB — TSH: TSH: 0.439 u[IU]/mL (ref 0.350–4.500)

## 2015-06-19 MED ORDER — WARFARIN SODIUM 5 MG PO TABS: 5.0000 mg | ORAL_TABLET | ORAL | Status: DC

## 2015-06-19 MED ORDER — LISINOPRIL 5 MG PO TABS: 5.0000 mg | ORAL_TABLET | Freq: Every day | ORAL | Status: DC

## 2015-06-19 NOTE — Progress Notes (Signed)
Patient ID: Tony Freeman, male   DOB: 10-26-1951, 64 y.o.   MRN: [id number] Primary Physician: Tanya Nones, MD   HPI: [name] is a 64 year old man with history of atrial fibrillation, CAD (s/p multiple stents) chronic combined CHF (echo 04/02/2014 with LV EF: 25-30%, diffuse hypokinesis), ischemic cardiomyopathy s/p dual chamber Biotronik ICD placement 06/29/2014, DM2, ESRD, asthma, HLD, abnormal renal US 03/2014 (followed by urology).  He has a h/o PCI LAD/RCA in 2007, PCI LAD/RCA in 2009 at [facility], abnormal lexiscan stress test 06/2013 moderate reversible perfusion abnormality in anterior, septal and apical region, then PCI to LAD 08/2013. [facility] records indicate that coronary angio at that time also showed CTO of RCA and nonobstructive disease in the LCx. Echo at [place] at least back to 01/2013 showed EF 35-40%, then <25% in 04/9023 with diastolic dysfunction and elevated RVSP. During his 03/2014 admission at [facility], he was diagnosed with new onset atrial fib-vs-flutter. Echo showed false tendon in LV apex, moderate LVH, EF 25-30%, diffuse HK, mod LAE, trivial TR but no mention of pulm HTN. He also had one troponin that was 0.11 and the rest were negative, felt due to CKD and intracranial etiology with some encephalopathy. He was discharged on Coumadin (along with his Plavix that he was on from prior PCI/stroke). However, it's not totally clear what he did with the Coumadin as it was no longer on his list when he saw a cardiologist in the [facility] system at the end of June. The patient thinks his cardiologist took him off it but the note doesn't reflect that (the patient does have some cognitive difficulties since his stroke and lives with his son who manages his medicine). He went on to have ICD placement on 06/29/14. Reading their notes, it appears they were not aware of his 03/2014 diagnosis of atrial fibrillation until it was picked up on his ICD interrogation in early September at which  time he was placed on Edoxaban.   Admitted 07/16/14 with increased dyspnea. Found to be in atrial fibrillation with RVR. Had TEE/DC-CV. TEE showed EF 25%, RV mildly dilated and moderately to severely dysfunctional.  Successful cardioversion completed. He required IV lasix and short term milrinone. He transitioned to torsemide 40 mg twice a day. He remained on edoxaban. He was discharged on amiodarone 400 mg daily, carvedilol 6.25 mg twice a day, and hydralazine 50 mg tid/imdur 60 mg daily. Discharged weight was 204 pounds.   Cardiolite in 2/16 showed EF 31%, no ischemia.    He was admitted in 3/16 with acute bronchitis, hypercalcemia, and AKI with creatinine up to 5.5.   Bone marrow biopsy in 4/16 showed no significant abnormalities.     He seemed to do well until 7/16 when he had a cardiac arrest at [place] (apparently PEA, had CPR and epinephrine).  He was admitted to [facility].  Coronary angiography was done, and he had PTCA to proximal LAD in-stent restenosis.  The RCA had a chronic total occlusion.  He developed AKI and progressed to ESRD, now on HD.  He went to SNF initially after hospitalization, now is home with his son.  He was discharged from [place] on coumadin, but he was not sent home from the SNF on coumadin.  Currently on ASA and Plavix.  He is in NSR.  He is short of breath walking 100 yards.  He has some chest pain with coughing, seems to be related to injury from CPR, but no exertional chest pain.  No  PND, +orthopnea.    ECHO 03/2014 EF 25-30%   Labs  07/21/14 TSH 0.413  10/15 K 3.5 => 4.3 Creatinine 3.86 => 3.96, Na 137  3/16 TSH normal 4/16 K 3.8, creatinine 4.62, HCT 28.8, AST/ALT normal 7/16 HCT 30  ECG: NSR, IVCD, left axis deviation  ROS: All systems negative except as listed in HPI, PMH and Problem List.  Past Medical History  Diagnosis Date  . Diabetes mellitus with nephropathy   . Systolic and diastolic CHF, chronic     a. Echo at [facility] 11/17/2013 Ef <25%,  severe hypokinesis of LV, moderately dilated LA, grade IV/IV diastolic dysfunction with irreversible restrictive pattern of mitral inflow, severe pulm HTN (RVSP >37mHg), 1-2+ MR  [facility] EF 25-30%, moderate concentric LVH, diffuse hypokinesis, moderately dilated LA, no significant MR observed  . CAD (coronary artery disease)     a. PCI LAD/RCA in 2007  b. [facility] c. PCI LAD 08/2013, CTO of RCA and nonobstructive disease in the LCx.  .Marland KitchenHyperlipidemia   . Stroke   . Dementia   . Ischemic cardiomyopathy     a. s/p Biotronik ICD 06/2014.  .Marland KitchenRetinopathy     bilateral  . CKD (chronic kidney disease), stage IV   . HTN (hypertension)   . Abnormal renal ultrasound     a. 03/2014: Abnormal renal ultrasound with left renal lesion and bladder wall thickening and microscopic hematuria. Multiple cysts on prior MRI. Given inability to use contrast, f/u imaging limited, repeat [place] with outpatient uro f/u.  .Marland KitchenParoxysmal atrial fibrillation   . Paroxysmal atrial flutter     Current Outpatient Prescriptions  Medication Sig Dispense Refill  . acetaminophen (TYLENOL) 325 MG tablet Take two tablets by mouth every 8 hours as needed for pain    . albuterol (PROVENTIL HFA;VENTOLIN HFA) 108 (90 BASE) MCG/ACT inhaler Inhale 2 puffs into the lungs every 6 (six) hours as needed for wheezing or shortness of breath.    .Marland Kitchenalbuterol (PROVENTIL) (2.5 MG/3ML) 0.083% nebulizer solution Take 2.5 mg by nebulization every 6 (six) hours as needed for wheezing or shortness of breath.    .Marland Kitchenamiodarone (PACERONE) 100 MG tablet Take one tablet by mouth once daily for AFIB    . aspirin EC 81 MG tablet Take 81 mg by mouth daily.    .Marland Kitchenatorvastatin (LIPITOR) 40 MG tablet Take 1 tablet (40 mg total) by mouth daily. (Patient taking differently: Take 40 mg by mouth daily. cholesterol) 30 tablet 0  . carvedilol (COREG) 6.25 MG tablet Take one tablet by mouth twice daily for hypertension     . clopidogrel (PLAVIX) 75 MG tablet Take one tablet by mouth once daily for 30 days for CAD ends 8/26    . docusate sodium (COLACE) 100 MG capsule Take 100 mg by mouth 2 (two) times daily.    . insulin lispro (HUMALOG) 100 UNIT/ML injection Inject 10 Units into the skin 3 (three) times daily before meals. For BS > 140    . lisinopril (PRINIVIL,ZESTRIL) 5 MG tablet Take 1 tablet (5 mg total) by mouth daily. 30 tablet 6  . loratadine (CLARITIN) 10 MG tablet Take one tablet by mouth once daily for allergies    . mirtazapine (REMERON) 7.5 MG tablet Take one tablet by mouth every night at bedtime for sleep    . oxyCODONE (ROXICODONE) 5 MG immediate release tablet Take one tablet by mouth every 8 hours  as needed for pain    . polyethylene glycol (MIRALAX / GLYCOLAX) packet Mix 17gm in 4-6 oz of liquid daily for constipation    . B Complex-C-Folic Acid (NEPHRO-VITE PO) Take 1 tablet by mouth daily.    . Protein (PROCEL 100) POWD Take 1 scoop by mouth 2 (two) times daily. For nutritional supplement    . warfarin (COUMADIN) 5 MG tablet Take 1 tablet (5 mg total) by mouth as directed. 45 tablet 3   No current facility-administered medications for this encounter.   PHYSICAL EXAM: Filed Vitals:   06/19/15 0821  BP: 130/70  Pulse: 82  Weight: 182 lb (82.555 kg)  SpO2: 93%   General:  Well appearing. No resp difficulty. Ambulated slowly in the clinic. Son present  HEENT: normal Neck: supple. JVP 10-12 cm. Carotids 2+ bilaterally; no bruits. No lymphadenopathy or thryomegaly appreciated. Cor: PMI normal. Regular rate & rhythm. No rubs, gallops. 3/6 HSM at apex.   Lungs: clear Abdomen: soft, nontender, nondistended. No hepatosplenomegaly. No bruits or masses. Good bowel sounds. Extremities: no cyanosis, clubbing, rash.  1+ edema 1/2 to knees bilaterally.   Neuro: alert & orientedx3, cranial nerves grossly intact. Moves all 4 extremities w/o difficulty. Affect pleasant.  ASSESSMENT & PLAN: 1.  Chronic systolic HF: Ischemic cardiomyopathy. Has Biotronik ICD. ECHO 03/2014 EF 25-30%. TEE 9/15 with EF 25%, mildly dilated RV with moderate to severe systolic dysfunction.  Cardiolite (2/16) with EF 31%. NYHA class III symptoms.  He is volume overloaded on exam.  Now ESRD and getting HD.  - I think that he needs more aggressive fluid removal at HD, he is volume overloaded and symptomatic.  BP looks reasonable so he should be able to tolerate more fluid off.  - Continue Coreg 6.25 mg bid.  - He was sent home from [place] on lisinopril but at some point it was dropped.  He can restart lisinopril at prior dose 5 mg daily.    - I will get echocardiogram.  - Reinforced daily weights, low salt food choices, and daily weights.   2. Atrial fibrillation: Paroxysmal.  In 9/15, had TEE with no LAA thrombus and successful DC-CV.  He is in NSR today.  He was on coumadin when he was sent home from [facility], not sure why he is not on it now.  - Continue amiodarone 100 mg daily, check LFTs and TSH now.  He will need regular eye exams on amiodarone. - I think he should start back on coumadin.  He has had no problems with GI bleeding.  Will follow in coumadin clinic, aim for INR 2-2.5 while on Plavix.  3. CAD: Cardiac arrest 7/16 with cath and cutting balloon angioplasty to proximal LAD in-stent restenosis.  Post-PTCA, should have at least 1 month ASA/Plavix.  He has had > 1 month now.  He will start coumadin.  I think that he can stop ASA for now.  Will continue Plavix/warfarin for 1 year as long as he tolerates then likely stop Plavix.  4. ESRD: As above, hopefully can remove more fluid at HD.   Followup in 6 wks at CHF clinic.   Laraya Pestka,MD 1:25 PM  06/19/2015

## 2015-06-19 NOTE — Patient Instructions (Signed)
Start Lisinopril 5 mg daily  Start Coumadin 5 mg daily, you are scheduled to see the coumadin clinic on MOn 8/29 at 2:30 they will check your level and advise what your dose should be, once your level is greater than 2 you can stop your Aspirin  Labs today  Your physician has requested that you have an echocardiogram. Echocardiography is a painless test that uses sound waves to create images of your heart. It provides your doctor with information about the size and shape of your heart and how well your heart's chambers and valves are working. This procedure takes approximately one hour. There are no restrictions for this procedure.  Your physician recommends that you schedule a follow-up appointment in: 6 weeks

## 2015-06-19 NOTE — Addendum Note (Signed)
Encounter addended by: Larey Dresser, MD on: 06/19/2015  1:47 PM<BR>     Documentation filed: Clinical Notes

## 2015-06-25 ENCOUNTER — Ambulatory Visit (INDEPENDENT_AMBULATORY_CARE_PROVIDER_SITE_OTHER): Payer: Medicare HMO

## 2015-06-25 ENCOUNTER — Other Ambulatory Visit: Payer: Self-pay

## 2015-06-25 ENCOUNTER — Ambulatory Visit (HOSPITAL_COMMUNITY): Payer: Medicare HMO | Attending: Cardiology

## 2015-06-25 DIAGNOSIS — Z5181 Encounter for therapeutic drug level monitoring: Secondary | ICD-10-CM | POA: Diagnosis not present

## 2015-06-25 DIAGNOSIS — I4891 Unspecified atrial fibrillation: Secondary | ICD-10-CM | POA: Diagnosis not present

## 2015-06-25 DIAGNOSIS — I340 Nonrheumatic mitral (valve) insufficiency: Secondary | ICD-10-CM | POA: Insufficient documentation

## 2015-06-25 DIAGNOSIS — I517 Cardiomegaly: Secondary | ICD-10-CM | POA: Insufficient documentation

## 2015-06-25 DIAGNOSIS — I371 Nonrheumatic pulmonary valve insufficiency: Secondary | ICD-10-CM | POA: Diagnosis not present

## 2015-06-25 DIAGNOSIS — E785 Hyperlipidemia, unspecified: Secondary | ICD-10-CM | POA: Diagnosis not present

## 2015-06-25 DIAGNOSIS — I5022 Chronic systolic (congestive) heart failure: Secondary | ICD-10-CM

## 2015-06-25 DIAGNOSIS — I071 Rheumatic tricuspid insufficiency: Secondary | ICD-10-CM | POA: Insufficient documentation

## 2015-06-25 DIAGNOSIS — E119 Type 2 diabetes mellitus without complications: Secondary | ICD-10-CM | POA: Insufficient documentation

## 2015-06-25 DIAGNOSIS — I509 Heart failure, unspecified: Secondary | ICD-10-CM | POA: Insufficient documentation

## 2015-06-25 LAB — POCT INR: INR: 1.8

## 2015-06-25 NOTE — Patient Instructions (Signed)

## 2015-06-26 ENCOUNTER — Encounter: Payer: Self-pay | Admitting: Vascular Surgery

## 2015-06-27 ENCOUNTER — Ambulatory Visit (INDEPENDENT_AMBULATORY_CARE_PROVIDER_SITE_OTHER): Payer: Medicare HMO | Admitting: Vascular Surgery

## 2015-06-27 ENCOUNTER — Ambulatory Visit (HOSPITAL_COMMUNITY)
Admission: RE | Admit: 2015-06-27 | Discharge: 2015-06-27 | Disposition: A | Discharge status: Home / Self Care | Payer: Medicare HMO | Source: Ambulatory Visit | Attending: Vascular Surgery | Admitting: Vascular Surgery

## 2015-06-27 ENCOUNTER — Ambulatory Visit (INDEPENDENT_AMBULATORY_CARE_PROVIDER_SITE_OTHER)
Admission: RE | Admit: 2015-06-27 | Discharge: 2015-06-27 | Disposition: A | Discharge status: Home / Self Care | Payer: Medicare HMO | Source: Ambulatory Visit | Attending: Vascular Surgery | Admitting: Vascular Surgery

## 2015-06-27 ENCOUNTER — Encounter: Payer: Self-pay | Admitting: Vascular Surgery

## 2015-06-27 VITALS — BP 121/72 | HR 69 | Temp 98.5°F | Resp 16 | Ht 66.0 in | Wt 177.0 lb

## 2015-06-27 DIAGNOSIS — Z01810 Encounter for preprocedural cardiovascular examination: Secondary | ICD-10-CM

## 2015-06-27 DIAGNOSIS — N185 Chronic kidney disease, stage 5: Secondary | ICD-10-CM

## 2015-06-27 DIAGNOSIS — E785 Hyperlipidemia, unspecified: Secondary | ICD-10-CM | POA: Diagnosis not present

## 2015-06-27 DIAGNOSIS — I129 Hypertensive chronic kidney disease with stage 1 through stage 4 chronic kidney disease, or unspecified chronic kidney disease: Secondary | ICD-10-CM | POA: Diagnosis not present

## 2015-06-27 DIAGNOSIS — E119 Type 2 diabetes mellitus without complications: Secondary | ICD-10-CM | POA: Insufficient documentation

## 2015-06-27 NOTE — Progress Notes (Signed)
[facility] [place]  Patient name: Tony Freeman MRN: [id number] DOB: 1951/05/03 Sex: male  REASON FOR CONSULT: Evaluate for hemodialysis access.  HPI: Tony Freeman is a 64 y.o. male who has stage V chronic kidney disease. He dialyzes Tuesdays Thursdays and Saturdays. He was traveling home from [place] and arrived to the airport in [facility]. He apparently arrested and required CPR. He was hospitalized there and his stage IV chronic kidney disease progressed to stage V chronic kidney disease. He had a catheter placed in [facility]. He is referred to [place] for evaluation for permanent access. He denies any recent uremic symptoms. Specifically, he denies nausea, vomiting, fatigue, anorexia, or palpitations.  I have reviewed the records from [facility]. The patient has stage V chronic kidney disease. Patient also has a history of atrial fibrillation and is on chronic Coumadin therapy.   Past Medical History  Diagnosis Date  . Diabetes mellitus with nephropathy   . Systolic and diastolic CHF, chronic     a. Echo at [facility] 11/17/2013 Ef <25%, severe hypokinesis of LV, moderately dilated LA, grade IV/IV diastolic dysfunction with irreversible restrictive pattern of mitral inflow, severe pulm HTN (RVSP >51mmHg), 1-2+ MR  b. Echo at [facility] 04/02/14 EF 25-30%, moderate concentric LVH, diffuse hypokinesis, moderately dilated LA, no significant MR observed  . CAD (coronary artery disease)     a. PCI LAD/RCA in 2007  b. PCI LAD/RCA in 2009 at [facility]  c. PCI LAD 08/2013, CTO of RCA and nonobstructive disease in the LCx.  Marland Kitchen Hyperlipidemia   . Stroke   . Dementia   . Ischemic cardiomyopathy     a. s/p Biotronik ICD 06/2014.  Marland Kitchen Retinopathy     bilateral  . CKD (chronic kidney disease), stage IV   . HTN (hypertension)   . Abnormal renal ultrasound     a. 03/2014: Abnormal renal ultrasound with left renal lesion and bladder  wall thickening and microscopic hematuria. Multiple cysts on prior MRI. Given inability to use contrast, f/u imaging limited, repeat US 3 months with outpatient uro f/u.  Marland Kitchen Paroxysmal atrial fibrillation   . Paroxysmal atrial flutter   . Anemia   . Myocardial infarction July 2016    Heart attack  . Peripheral vascular disease    Family History  Problem Relation Age of Onset  . Heart disease      brother  . Diabetes Mother     Bilateral Leg  . Heart disease Mother   . Cancer Father     Right neck  . Kidney disease    . Diabetes Sister   . Hyperlipidemia Sister   . Hypertension Sister   . Diabetes Brother   . Hyperlipidemia Brother   . Hypertension Brother   . Heart attack Brother    SOCIAL HISTORY: Social History  Substance Use Topics  . Smoking status: Never Smoker   . Smokeless tobacco: Never Used  . Alcohol Use: No   No Known Allergies Current Outpatient Prescriptions  Medication Sig Dispense Refill  . acetaminophen (TYLENOL) 325 MG tablet Take two tablets by mouth every 8 hours as needed for pain    . albuterol (PROVENTIL HFA;VENTOLIN HFA) 108 (90 BASE) MCG/ACT inhaler Inhale 2 puffs into the lungs every 6 (six) hours as needed for wheezing or shortness of breath.    Marland Kitchen albuterol (PROVENTIL) (2.5 MG/3ML) 0.083% nebulizer solution Take 2.5 mg by nebulization every 6 (six) hours as needed for  wheezing or shortness of breath.    Marland Kitchen amiodarone (PACERONE) 100 MG tablet Take one tablet by mouth once daily for AFIB    . atorvastatin (LIPITOR) 40 MG tablet Take 1 tablet (40 mg total) by mouth daily. (Patient taking differently: Take 40 mg by mouth daily. cholesterol) 30 tablet 0  . B Complex-C-Folic Acid (NEPHRO-VITE PO) Take 1 tablet by mouth daily.    . carvedilol (COREG) 6.25 MG tablet Take one tablet by mouth twice daily for hypertension    . docusate sodium (COLACE) 100 MG capsule Take 100 mg by mouth 2 (two) times daily.    . insulin lispro (HUMALOG) 100 UNIT/ML injection  Inject 10 Units into the skin 3 (three) times daily before meals. For BS > 140    . lisinopril (PRINIVIL,ZESTRIL) 5 MG tablet Take 1 tablet (5 mg total) by mouth daily. 30 tablet 6  . loratadine (CLARITIN) 10 MG tablet Take one tablet by mouth once daily for allergies    . oxyCODONE (ROXICODONE) 5 MG immediate release tablet Take one tablet by mouth every 8 hours as needed for pain    . polyethylene glycol (MIRALAX / GLYCOLAX) packet Mix 17gm in 4-6 oz of liquid daily for constipation    . Protein (PROCEL 100) POWD Take 1 scoop by mouth 2 (two) times daily. For nutritional supplement    . warfarin (COUMADIN) 5 MG tablet Take 1 tablet (5 mg total) by mouth as directed. 45 tablet 3  . aspirin EC 81 MG tablet Take 81 mg by mouth daily.    . mirtazapine (REMERON) 7.5 MG tablet Take one tablet by mouth every night at bedtime for sleep     No current facility-administered medications for this visit.   REVIEW OF SYSTEMS: Valu.Nieves ] denotes positive finding; [  ] denotes negative finding  CARDIOVASCULAR:  Valu.Nieves ] chest pain this sounds like musculoskeletal pain related to his previous CPR.  [ ]  chest pressure   [ ]  palpitations   [ ]  orthopnea   Valu.Nieves ] dyspnea on exertion   [ ]  claudication   [ ]  rest pain   [ ]  DVT   [ ]  phlebitis PULMONARY:   [ ]  productive cough   Valu.Nieves ] asthma   [ ]  wheezing NEUROLOGIC:   [ ]  weakness  [ ]  paresthesias  [ ]  aphasia  [ ]  amaurosis  [ ]  dizziness HEMATOLOGIC:   [ ]  bleeding problems   [ ]  clotting disorders MUSCULOSKELETAL:  [ ]  joint pain   [ ]  joint swelling [ ]  leg swelling GASTROINTESTINAL: [ ]   blood in stool  [ ]   hematemesis GENITOURINARY:  [ ]   dysuria  [ ]   hematuria PSYCHIATRIC:  [ ]  history of major depression INTEGUMENTARY:  [ ]  rashes  [ ]  ulcers CONSTITUTIONAL:  [ ]  fever   [ ]  chills  PHYSICAL EXAM: Filed Vitals:   06/27/15 1540  BP: 121/72  Pulse: 69  Temp: 98.5 F (36.9 C)  TempSrc: Oral  Resp: 16  Height: 5\' 6"  (1.676 m)  Weight: 177 lb (80.287 kg)    SpO2: 100%   GENERAL: The patient is a well-nourished male, in no acute distress. The vital signs are documented above. CARDIAC: There is a regular rate and rhythm.  VASCULAR: I do not detect carotid bruits. He has a diminished left radial pulse. He has a normal right radial pulse. PULMONARY: There is good air exchange bilaterally without wheezing or rales. ABDOMEN: Soft and non-tender with  normal pitched bowel sounds.  MUSCULOSKELETAL: There are no major deformities or cyanosis. NEUROLOGIC: No focal weakness or paresthesias are detected. SKIN: There are no ulcers or rashes noted. PSYCHIATRIC: The patient has a normal affect.  DATA:   UPPER EXTREMITY ARTERIAL DUPLEX: The patient has triphasic radial signals bilaterally. There is a biphasic right ulnar signal and a triphasic left ulnar signal.  UPPER EXTREMITY VEIN MAP:  On the right side, the forearm and upper arm cephalic vein is marginal in size. The basilic vein on the right is potentially usable although somewhat small. On the left side, the forearm and upper arm cephalic vein is marginal in size but potentially usable. The basilic vein is potentially usable on the left.  MEDICAL ISSUES:  STAGE V CHRONIC KIDNEY DISEASE: Given the diminished radial pulse on the left despite normal arterial signals at the wrist I think his best option for a fistula would be a left brachial cephalic fistula if the upper arm cephalic vein is adequate. If this is not adequate then he could potentially have a basilic vein transposition on the left. If neither is adequate he would require placement of an AV graft. I have explained the indications for placement of an AV fistula or AV graft. I've explained that if at all possible we will place an AV fistula.  I have reviewed the risks of placement of an AV fistula including but not limited to: failure of the fistula to mature, need for subsequent interventions, and thrombosis. In addition I have reviewed the  potential complications of placement of an AV graft. These risks include, but are not limited to, graft thrombosis, graft infection, wound healing problems, bleeding, arm swelling, and steal syndrome. All the patient's questions were answered and they are agreeable to proceed with surgery. He did not want to schedule this immediately and for now is scheduled for October 7. We will have to stop his Coumadin for 5 days prior to the procedure.  Deitra Mayo [facility] [place]: [phone]

## 2015-06-28 ENCOUNTER — Other Ambulatory Visit: Payer: Self-pay

## 2015-06-28 ENCOUNTER — Telehealth: Payer: Self-pay | Admitting: Cardiology

## 2015-06-28 ENCOUNTER — Encounter: Payer: Self-pay | Admitting: Nephrology

## 2015-06-28 NOTE — Telephone Encounter (Signed)
Will be very difficult to bridge.  He is also on Plavix which will have to be held for fistula (had PTCA and no stent).  I would not try to bridge him on Lovenox as I think this is more likely to cause harm with ESRD, just minimize time off coumadin.

## 2015-06-28 NOTE — Telephone Encounter (Signed)
Patient has a CHADS2 score of 5 (stroke was in 2010) however he also has ESRD and is on dialysis. Will route to Dr. Aundra Dubin to see if he would like to bridge pt before fistula placement on 08/03/15 given reduced clearance of Lovenox in ESRD.  Will coordinate at next Coumadin appt on 10/1.

## 2015-06-28 NOTE — Telephone Encounter (Signed)
New message     Want coumadin to know they will do a fiscular for hemodialysis on 08-03-15.  He has an appt on 9-6 and will need to stop coumadin on 10-1.  Will this be ok?  Want coumadin clinic to know

## 2015-06-29 ENCOUNTER — Other Ambulatory Visit: Payer: Self-pay

## 2015-07-03 ENCOUNTER — Telehealth (HOSPITAL_COMMUNITY): Payer: Self-pay | Admitting: Vascular Surgery

## 2015-07-03 NOTE — Telephone Encounter (Signed)
Called pt to change his OCT 5 @ 9 appt to another time or date

## 2015-07-05 ENCOUNTER — Telehealth (HOSPITAL_COMMUNITY): Payer: Self-pay | Admitting: Vascular Surgery

## 2015-07-05 NOTE — Telephone Encounter (Signed)
Called pt several time to resch appt no MD on the day he was schedule, I canceled appt waiting on pt to call me back to resch.Marland Kitchen

## 2015-07-05 NOTE — Telephone Encounter (Signed)
Dr. Scot Dock - what is the minimum time off Coumadin that you're comfortable with for fistula placement for Mr. Venuto?

## 2015-07-11 ENCOUNTER — Telehealth: Payer: Self-pay

## 2015-07-11 ENCOUNTER — Telehealth: Payer: Self-pay | Admitting: Cardiology

## 2015-07-11 NOTE — Telephone Encounter (Signed)
Spoke with Dr. Scot Dock about patients upcoming surgery and anticoagulation therapy. Dr. Scot Dock states he is okay proceeding with surgery on 08/03/15, with patient stopping Coumadin 4 days prior. Spoke with Fuller Canada, RPH at [facility] regarding Dr. Nicole Cella response. Megan verbalized understanding and stated that she would call patient and provide these instructions.

## 2015-07-11 NOTE — Telephone Encounter (Signed)
New message       Calling to talk to [facility].  Please return her call

## 2015-07-11 NOTE — Telephone Encounter (Signed)
Spoke with Colletta Maryland, RN at vascular and vein with Dr. Scot Dock. He feels comfortable holding Coumadin for 4 days in [name] and holding Plavix for 5 days. Called patient to relay this information to him. Pt understands. Scheduled next Coumadin appt as well.

## 2015-07-11 NOTE — Telephone Encounter (Signed)
Spoke with Colletta Maryland RN at vascular and vein with Dr. Scot Dock. He feels comfortable holding Coumadin for 4 days and holding Plavix for 5 days. Called patient to inform him of this and scheduled next Coumadin appt as well.

## 2015-07-17 ENCOUNTER — Other Ambulatory Visit (HOSPITAL_COMMUNITY): Payer: Self-pay | Admitting: *Deleted

## 2015-07-26 ENCOUNTER — Other Ambulatory Visit: Payer: Self-pay | Admitting: *Deleted

## 2015-07-26 DIAGNOSIS — D638 Anemia in other chronic diseases classified elsewhere: Secondary | ICD-10-CM

## 2015-07-27 ENCOUNTER — Ambulatory Visit: Payer: Medicare HMO

## 2015-07-27 ENCOUNTER — Telehealth: Payer: Self-pay | Admitting: Hematology

## 2015-07-27 ENCOUNTER — Other Ambulatory Visit: Payer: Medicare HMO

## 2015-07-27 ENCOUNTER — Other Ambulatory Visit: Payer: Self-pay | Admitting: *Deleted

## 2015-07-27 ENCOUNTER — Encounter: Payer: Medicare HMO | Admitting: Hematology

## 2015-07-27 ENCOUNTER — Ambulatory Visit (INDEPENDENT_AMBULATORY_CARE_PROVIDER_SITE_OTHER): Payer: Medicare HMO | Admitting: *Deleted

## 2015-07-27 ENCOUNTER — Encounter: Payer: Self-pay | Admitting: Hematology

## 2015-07-27 ENCOUNTER — Telehealth: Payer: Self-pay | Admitting: *Deleted

## 2015-07-27 DIAGNOSIS — Z5181 Encounter for therapeutic drug level monitoring: Secondary | ICD-10-CM | POA: Diagnosis not present

## 2015-07-27 DIAGNOSIS — I4891 Unspecified atrial fibrillation: Secondary | ICD-10-CM

## 2015-07-27 LAB — POCT INR: INR: 3.8

## 2015-07-27 MED ORDER — CLOPIDOGREL BISULFATE 75 MG PO TABS: 75.0000 mg | ORAL_TABLET | Freq: Every day | ORAL | Status: DC

## 2015-07-27 NOTE — Telephone Encounter (Signed)
per pof to r/s pt appt-cld & spoke to pt and gave pt r/s appt time & date

## 2015-07-27 NOTE — Telephone Encounter (Signed)
Received vm call from [facility] requesting a call back.  Returned call & spoke with Jonelle Sidle who states that pt was there by mistake this am & they noticed he had an appt at this office.  Pt was there with son & son tried to reach the office without success.  Pt has been on coumadin since August & is having an AV fistula 08/03/15 so it was good that he showed up at the coumadin clinic.  They are holding the coumadin.  Will have scheduling r/s pt & call pt's son.

## 2015-07-27 NOTE — Progress Notes (Signed)
This encounter was created in error - please disregard.

## 2015-07-30 ENCOUNTER — Ambulatory Visit (HOSPITAL_BASED_OUTPATIENT_CLINIC_OR_DEPARTMENT_OTHER): Payer: Medicare HMO

## 2015-07-30 ENCOUNTER — Ambulatory Visit (HOSPITAL_BASED_OUTPATIENT_CLINIC_OR_DEPARTMENT_OTHER): Payer: Medicare HMO | Admitting: Hematology

## 2015-07-30 ENCOUNTER — Telehealth: Payer: Self-pay | Admitting: Hematology

## 2015-07-30 ENCOUNTER — Other Ambulatory Visit (HOSPITAL_BASED_OUTPATIENT_CLINIC_OR_DEPARTMENT_OTHER): Payer: Medicare HMO

## 2015-07-30 ENCOUNTER — Encounter: Payer: Self-pay | Admitting: Hematology

## 2015-07-30 VITALS — BP 124/69 | HR 96 | Temp 98.3°F | Resp 20 | Ht 66.0 in | Wt 180.1 lb

## 2015-07-30 DIAGNOSIS — E119 Type 2 diabetes mellitus without complications: Secondary | ICD-10-CM

## 2015-07-30 DIAGNOSIS — D649 Anemia, unspecified: Secondary | ICD-10-CM

## 2015-07-30 DIAGNOSIS — D631 Anemia in chronic kidney disease: Secondary | ICD-10-CM

## 2015-07-30 DIAGNOSIS — I10 Essential (primary) hypertension: Secondary | ICD-10-CM

## 2015-07-30 DIAGNOSIS — N184 Chronic kidney disease, stage 4 (severe): Secondary | ICD-10-CM

## 2015-07-30 DIAGNOSIS — D638 Anemia in other chronic diseases classified elsewhere: Secondary | ICD-10-CM

## 2015-07-30 LAB — CBC WITH DIFFERENTIAL/PLATELET
BASO%: 0.9 % (ref 0.0–2.0)
Basophils Absolute: 0 10*3/uL (ref 0.0–0.1)
EOS%: 3.8 % (ref 0.0–7.0)
Eosinophils Absolute: 0.2 10*3/uL (ref 0.0–0.5)
HEMATOCRIT: 32.5 % — AB (ref 38.4–49.9)
HEMOGLOBIN: 10.6 g/dL — AB (ref 13.0–17.1)
LYMPH#: 0.5 10*3/uL — AB (ref 0.9–3.3)
LYMPH%: 11.6 % — ABNORMAL LOW (ref 14.0–49.0)
MCH: 31.3 pg (ref 27.2–33.4)
MCHC: 32.5 g/dL (ref 32.0–36.0)
MCV: 96.4 fL (ref 79.3–98.0)
MONO#: 0.7 10*3/uL (ref 0.1–0.9)
MONO%: 16.1 % — ABNORMAL HIGH (ref 0.0–14.0)
NEUT#: 2.8 10*3/uL (ref 1.5–6.5)
NEUT%: 67.6 % (ref 39.0–75.0)
PLATELETS: 131 10*3/uL — AB (ref 140–400)
RBC: 3.38 10*6/uL — ABNORMAL LOW (ref 4.20–5.82)
RDW: 15.6 % — AB (ref 11.0–14.6)
WBC: 4.2 10*3/uL (ref 4.0–10.3)

## 2015-07-30 MED ORDER — DARBEPOETIN ALFA 100 MCG/0.5ML IJ SOSY
100.0000 ug | PREFILLED_SYRINGE | Freq: Once | INTRAMUSCULAR | Status: AC
  Administered 2015-07-30: 100 ug via SUBCUTANEOUS
  Filled 2015-07-30: qty 0.5

## 2015-07-30 NOTE — Patient Instructions (Signed)
Darbepoetin Alfa injection What is this medicine? DARBEPOETIN ALFA (dar be POE e tin AL fa) helps your body make more red blood cells. It is used to treat anemia caused by chronic kidney failure and chemotherapy. This medicine may be used for other purposes; ask your health care provider or pharmacist if you have questions. COMMON BRAND NAME(S): Aranesp What should I tell my health care provider before I take this medicine? They need to know if you have any of these conditions: -blood clotting disorders or history of blood clots -cancer patient not on chemotherapy -cystic fibrosis -heart disease, such as angina, heart failure, or a history of a heart attack -hemoglobin level of 12 g/dL or greater -high blood pressure -low levels of folate, iron, or vitamin B12 -seizures -an unusual or allergic reaction to darbepoetin, erythropoietin, albumin, hamster proteins, latex, other medicines, foods, dyes, or preservatives -pregnant or trying to get pregnant -breast-feeding How should I use this medicine? This medicine is for injection into a vein or under the skin. It is usually given by a health care professional in a hospital or clinic setting. If you get this medicine at home, you will be taught how to prepare and give this medicine. Do not shake the solution before you withdraw a dose. Use exactly as directed. Take your medicine at regular intervals. Do not take your medicine more often than directed. It is important that you put your used needles and syringes in a special sharps container. Do not put them in a trash can. If you do not have a sharps container, call your pharmacist or healthcare provider to get one. Talk to your pediatrician regarding the use of this medicine in children. While this medicine may be used in children as young as 1 year for selected conditions, precautions do apply. Overdosage: If you think you have taken too much of this medicine contact a poison control center or  emergency room at once. NOTE: This medicine is only for you. Do not share this medicine with others. What if I miss a dose? If you miss a dose, take it as soon as you can. If it is almost time for your next dose, take only that dose. Do not take double or extra doses. What may interact with this medicine? Do not take this medicine with any of the following medications: -epoetin alfa This list may not describe all possible interactions. Give your health care provider a list of all the medicines, herbs, non-prescription drugs, or dietary supplements you use. Also tell them if you smoke, drink alcohol, or use illegal drugs. Some items may interact with your medicine. What should I watch for while using this medicine? Visit your prescriber or health care professional for regular checks on your progress and for the needed blood tests and blood pressure measurements. It is especially important for the doctor to make sure your hemoglobin level is in the desired range, to limit the risk of potential side effects and to give you the best benefit. Keep all appointments for any recommended tests. Check your blood pressure as directed. Ask your doctor what your blood pressure should be and when you should contact him or her. As your body makes more red blood cells, you may need to take iron, folic acid, or vitamin B supplements. Ask your doctor or health care provider which products are right for you. If you have kidney disease continue dietary restrictions, even though this medication can make you feel better. Talk with your doctor or health   care professional about the foods you eat and the vitamins that you take. What side effects may I notice from receiving this medicine? Side effects that you should report to your doctor or health care professional as soon as possible: -allergic reactions like skin rash, itching or hives, swelling of the face, lips, or tongue -breathing problems -changes in vision -chest  pain -confusion, trouble speaking or understanding -feeling faint or lightheaded, falls -high blood pressure -muscle aches or pains -pain, swelling, warmth in the leg -rapid weight gain -severe headaches -sudden numbness or weakness of the face, arm or leg -trouble walking, dizziness, loss of balance or coordination -seizures (convulsions) -swelling of the ankles, feet, hands -unusually weak or tired Side effects that usually do not require medical attention (report to your doctor or health care professional if they continue or are bothersome): -diarrhea -fever, chills (flu-like symptoms) -headaches -nausea, vomiting -redness, stinging, or swelling at site where injected This list may not describe all possible side effects. Call your doctor for medical advice about side effects. You may report side effects to FDA at 1-800-FDA-1088. Where should I keep my medicine? Keep out of the reach of children. Store in a refrigerator between 2 and 8 degrees C (36 and 46 degrees F). Do not freeze. Do not shake. Throw away any unused portion if using a single-dose vial. Throw away any unused medicine after the expiration date. NOTE: This sheet is a summary. It may not cover all possible information. If you have questions about this medicine, talk to your doctor, pharmacist, or health care provider.  2015, [employer]. (2008-09-26 10:23:57)  

## 2015-07-30 NOTE — Progress Notes (Signed)
[facility]  [phone] [phone]  Clinic Follow up Note   Patient Care Team: Tanya Nones, MD as PCP - General (Family Medicine) Estanislado Emms, MD as Consulting Physician (Nephrology) 07/30/2015  CHIEF COMPLAINTS/PURPOSE OF CONSULTATION:  Anemia   HISTORY OF PRESENTING ILLNESS:  Tony Freeman 64 y.o. male with past medical history significant for hypertension, diabetes, CHF, CK take, and stroke, is here because of anemia.  He had stroke 6 years ago with residual right hand weakness and mild demetia. He is on disbility for that.   He was found to have abnormal CBC from  6 month ago (or longer), his CBC from 11/27/2014 showed hemoglobin 8.4, hematocrit 27.1, MCV 87.4, RDW 17.2. Pre-count and WBC 1 normal. His CMP showed total protein 6.8, calcium 10.6, alkaline phosphatase 226, BUN 39, creatinine 2.95, glucose 233, the rest were all within normal limits.   he complains fatigue for the past year.  He denies recent chest pain on exertion, (+) shortness of breath on mild exertion, no pre-syncopal episodes, or palpitations. He had not noticed any recent bleeding such as epistaxis, hematuria or hematochezia The patient denies over the counter NSAID ingestion. He is not on antiplatelets agents. His last EGD and colonoscopy was normal in one month ago.   He had no prior history or diagnosis of cancer. His age appropriate screening programs are up-to-date. He denies any pica and eats a variety of diet. He never donated blood or received blood transfusion The patient was prescribed oral iron supplements and he takes 2 tab daily for the past 4-6 month.   No fever, chills or night sweats, he feels cold most of time. He lost about 40 lbs in the past one year, but stable in the past 3-4 month. He has low appetite, no nausea or constipation   INTERIM HISTORY: Patient returns for follow-up. He has been on hemodialysis for several months now. He  is going to have a AV fistula done on his left arm this Friday. He still has moderate to moderate fatigue, overall slightly better. He has no other new complaints.  MEDICAL HISTORY:  Past Medical History  Diagnosis Date  . Diabetes mellitus with nephropathy ([place])   . Systolic and diastolic CHF, chronic ([place])     a. Echo at [facility] 11/17/2013 Ef <25%, severe hypokinesis of LV, moderately dilated LA, grade IV/IV diastolic dysfunction with irreversible restrictive pattern of mitral inflow, severe pulm HTN (RVSP >14mHg), 1-2+ MR  b. [facility] EF 25-30%, moderate concentric LVH, diffuse hypokinesis, moderately dilated LA, no significant MR observed  . CAD (coronary artery disease)     a. PCI LAD/RCA in 2007  b. [facility] c. PCI LAD 08/2013, CTO of RCA and nonobstructive disease in the LCx.  .Marland KitchenHyperlipidemia   . Stroke ([place]   . Dementia   . Ischemic cardiomyopathy     a. s/p Biotronik ICD 06/2014.  .Marland KitchenRetinopathy     bilateral  . CKD (chronic kidney disease), stage IV ([place]   . HTN (hypertension)   . Abnormal renal ultrasound     a. 03/2014: Abnormal renal ultrasound with left renal lesion and bladder wall thickening and microscopic hematuria. Multiple cysts on prior MRI. Given inability to use contrast, f/u imaging limited, repeat [place] months with outpatient uro f/u.  .Marland KitchenParoxysmal atrial fibrillation (HCC)   . Paroxysmal atrial flutter ([place]   . Anemia   . Myocardial  infarction [facility]) July 2016    Heart attack  . Peripheral vascular disease ([place])     SURGICAL HISTORY: Past Surgical History  Procedure Laterality Date  . Coronary stent placement      Multivessel coronary intervention in 2007 and 2009. Ischemic cardiomyopathyPCI LAD/ RCA in 2007 by Dr. Dwyane Dee. PCI LAD/RCA in 2009 at [facility]; PCI LAD 08/2013. Presented with unstable angina pectoris.. Coronary angio demonstrated proximal and distal LAD lesions.44/20 PROMUS stent in proximal LAD// PTCA  of distal LAD  . Cardiac defibrillator placement    . Spine surgery      anterior fusion  . Hernia repair    . Tee without cardioversion N/A 07/26/2014    Procedure: TRANSESOPHAGEAL ECHOCARDIOGRAM (TEE);  Surgeon: Larey Dresser, MD;  Location: [place];  Service: Cardiovascular;  Laterality: N/A;  . Cardioversion N/A 07/26/2014    Procedure: CARDIOVERSION;  Surgeon: Larey Dresser, MD;  Location: [facility];  Service: Cardiovascular;  Laterality: N/A;  . Right heart catheterization N/A 07/25/2014    Procedure: RIGHT HEART CATH;  Surgeon: Jolaine Artist, MD;  Location: [facility] CATH LAB;  Service: Cardiovascular;  Laterality: N/A;  . Cardiac defibrillator placement  06/2014    SOCIAL HISTORY: Social History   Social History  . Marital Status: Single    Spouse Name: N/A  . Number of Children: N/A  . Years of Education: N/A   Occupational History  . Not on file.   Social History Main Topics  . Smoking status: Never Smoker   . Smokeless tobacco: Never Used  . Alcohol Use: No  . Drug Use: No  . Sexual Activity: Not on file   Other Topics Concern  . Not on file   Social History Narrative   Going to be living with his son.  He lives with his daughter currently.  2 children.  Disability since CVA    FAMILY HISTORY: Family History  Problem Relation Age of Onset  . Heart disease      brother  . Diabetes Mother     Bilateral Leg  . Heart disease Mother   . Cancer Father     Right neck  . Kidney disease    . Diabetes Sister   . Hyperlipidemia Sister   . Hypertension Sister   . Diabetes Brother   . Hyperlipidemia Brother   . Hypertension Brother   . Heart attack Brother     ALLERGIES:  has No Known Allergies.  MEDICATIONS:  Current Outpatient Prescriptions  Medication Sig Dispense Refill  . acetaminophen (TYLENOL) 325 MG tablet Take two tablets by mouth every 8 hours as needed for pain    . albuterol (PROVENTIL HFA;VENTOLIN HFA) 108 (90 BASE) MCG/ACT inhaler  Inhale 2 puffs into the lungs every 6 (six) hours as needed for wheezing or shortness of breath.    Marland Kitchen albuterol (PROVENTIL) (2.5 MG/3ML) 0.083% nebulizer solution Take 2.5 mg by nebulization every 6 (six) hours as needed for wheezing or shortness of breath.    Marland Kitchen amiodarone (PACERONE) 100 MG tablet Take one tablet by mouth once daily for AFIB    . atorvastatin (LIPITOR) 40 MG tablet Take 1 tablet (40 mg total) by mouth daily. (Patient taking differently: Take 40 mg by mouth daily. cholesterol) 30 tablet 0  . B Complex-C-Folic Acid (NEPHRO-VITE PO) Take 1 tablet by mouth daily.    . carvedilol (COREG) 6.25 MG tablet Take one tablet by mouth twice daily for hypertension    . clopidogrel (PLAVIX) 75 MG  tablet Take 1 tablet (75 mg total) by mouth daily. 30 tablet 3  . docusate sodium (COLACE) 100 MG capsule Take 100 mg by mouth 2 (two) times daily.    . insulin lispro (HUMALOG) 100 UNIT/ML injection Inject 10 Units into the skin 3 (three) times daily before meals. For BS > 140    . lisinopril (PRINIVIL,ZESTRIL) 5 MG tablet Take 1 tablet (5 mg total) by mouth daily. 30 tablet 6  . loratadine (CLARITIN) 10 MG tablet Take one tablet by mouth once daily for allergies    . oxyCODONE (ROXICODONE) 5 MG immediate release tablet Take one tablet by mouth every 8 hours as needed for pain    . polyethylene glycol (MIRALAX / GLYCOLAX) packet Mix 17gm in 4-6 oz of liquid daily for constipation    . warfarin (COUMADIN) 5 MG tablet Take 1 tablet (5 mg total) by mouth as directed. 45 tablet 3   No current facility-administered medications for this visit.    REVIEW OF SYSTEMS:   Constitutional: Denies fevers, chills or abnormal night sweats Eyes: Denies blurriness of vision, double vision or watery eyes Ears, nose, mouth, throat, and face: Denies mucositis or sore throat Respiratory: Denies cough, dyspnea or wheezes Cardiovascular: Denies palpitation, chest discomfort or lower extremity swelling Gastrointestinal:   Denies nausea, heartburn or change in bowel habits Skin: Denies abnormal skin rashes Lymphatics: Denies new lymphadenopathy or easy bruising Neurological:Denies numbness, tingling or new weaknesses Behavioral/Psych: Mood is stable, no new changes  All other systems were reviewed with the patient and are negative.  PHYSICAL EXAMINATION: ECOG PERFORMANCE STATUS: 1 - Symptomatic but completely ambulatory BP 124/69 mmHg  Pulse 96  Temp(Src) 98.3 F (36.8 C) (Oral)  Resp 20  Ht 5' 6"  (1.676 m)  Wt 180 lb 1.6 oz (81.693 kg)  BMI 29.08 kg/m2  SpO2 98%  GENERAL:alert, no distress and comfortable SKIN: skin color, texture, turgor are normal, no rashes or significant lesions EYES: normal, conjunctiva are pink and non-injected, sclera clear OROPHARYNX:no exudate, no erythema and lips, buccal mucosa, and tongue normal  NECK: supple, thyroid normal size, non-tender, without nodularity LYMPH:  no palpable lymphadenopathy in the cervical, axillary or inguinal LUNGS: clear to auscultation and percussion with normal breathing effort HEART: regular rate & rhythm and no murmurs and no lower extremity edema ABDOMEN:abdomen soft, non-tender and normal bowel sounds Musculoskeletal:no cyanosis of digits and no clubbing  PSYCH: alert & oriented x 3 with fluent speech NEURO: no focal motor/sensory deficits  LABORATORY DATA:  I have reviewed the data as listed CBC Latest Ref Rng 07/30/2015 05/24/2015 05/20/2015  WBC 4.0 - 10.3 10e3/uL 4.2 5.2 4.3  Hemoglobin 13.0 - 17.1 g/dL 10.6(L) 9.5(A) 9.3(A)  Hematocrit 38.4 - 49.9 % 32.5(L) 30(A) 29(A)  Platelets 140 - 400 10e3/uL 131(L) 203 173    CMP Latest Ref Rng 06/19/2015 05/24/2015 05/20/2015  Glucose 70 - 140 mg/dl - - -  BUN 4 - 21 mg/dL - 44(A) 35(A)  Creatinine 0.6 - 1.3 mg/dL - 5.9(A) 5.7(A)  Sodium 137 - 147 mmol/L - 132(A) 134(A)  Potassium 3.4 - 5.3 mmol/L - 5.0 5.0  Chloride 96 - 112 mEq/L - - -  CO2 22 - 29 mEq/L - - -  Calcium 8.4 - 10.4 mg/dL  - - -  Total Protein 6.5 - 8.1 g/dL 7.4 - -  Total Bilirubin 0.3 - 1.2 mg/dL 1.7(H) - -  Alkaline Phos 38 - 126 U/L 168(H) - -  AST 15 - 41 U/L 28 - -  ALT 17 - 63 U/L 16(L) - -   PATHOLOGY REPORT: Diagnosis 4//2016 Bone Marrow, Aspirate,Biopsy, and Clot, right ilium - SLIGHTLY HYPERCELLULAR BONE MARROW FOR AGE WITH TRILINEAGE HEMATOPOIESIS. - SEVERAL GRANULOMATA PRESENT. - SEE COMMENT. PERIPHERAL BLOOD: - NORMOCYTIC-NORMOCHROMIC ANEMIA. - LYMPHOPENIA. Diagnosis Note The bone marrow is slightly hypercellular for age with trilineage hematopoiesis and nonspecific changes. Significant dyspoiesis is not present. The clot and core biopsy sections show several variably sized but predominantly small epithelioid granulomata with lack of necrosis. Special stains for microorganisms including AFB and GMS are negative. In this setting, the differential diagnosis includes infection, medication, autoimmune disease, sarcoidosis, etc. Clinical correlation is strongly recommended. (BNS:ecj 02/02/2015)  Cytogenetic: normal   RADIOGRAPHIC STUDIES: I have personally reviewed the radiological images as listed and agreed with the findings in the report.  Ct Chest, Abdomen Pelvis Wo Contrast 03/14/2015    IMPRESSION: Mildly prominent thoracic lymph nodes, likely reactive. No suspicious abdominopelvic lymphadenopathy.  Spleen is normal in size.  Cardiomegaly. Small right pleural effusion.  Cholelithiasis, without evidence of acute cholecystitis.  Bilateral renal cysts, including a 12 mm hemorrhagic cyst in the right lower pole.  Prostatomegaly with a thick-walled bladder, possibly reflecting chronic bladder outlet obstruction.  Additional ancillary findings as above.   Electronically Signed   By: Julian Hy M.D.   On: 03/14/2015 09:27    ASSESSMENT & PLAN:  64 year old male with multiple comorbidities, including hypertension, diabetes, CHF, stroke, dementia, stage IV CKD, here for evaluation of his  anemia.  1. Anemia of chronic disease, secondary to CKD -He had a normal CBC in June 2015, developed worsening anemia since then. His CBC today showed a normal MCV, low reticulocyte count at 76,  This is consistent with normocytic hypo-productive anemia -His iron study showed ferritin 279, normal serum iron and TIBC, slightly low iron saturation, normal folic acid and X45 level, this is not consistent with nutritional anemia -his TSH, testosterone level, , SPEP/UPEP with immunofixation were unremarkable. erythropoietin level was 34.3, mild elevated. -I discussed his bone marrow biopsy, no evidence of marrow disease. -I think his anemia is mainly secondary to his stage IV chronic kidney disease, possible small component of iron deficiency.  -I discussed the treatment option of EPO injection. The benefit and risks, such as thrombosis, especially heart attack and stroke, we'll discussed with patient and his family in great details. He agrees to proceed. -He responded well to Aranesp 100 mcg injection well, hemoglobin 10.5 today, we'll give him the second dose Aranesp today -He is now on dialysis, is probably more convenient for patient to have his CBC checked and the received her Aranesp at this time is Center. I'll discuss with his nephrologist Dr. Florene Glen.  2. 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 -I discussed his CT scan results with him and his daughter. He has mild prominent mediastinal adenopathy, no other significant findings. I do not think she needs routine follow-up scan .   3. stage IV CKD -He is going to follow-up with his nephrologist Dr. Florene Glen. -He is planned to have a fistula in left arm put in later this week.  4. Hypertension, diabetes, CHF, COPD,  history of stroke -He will continue follow-up with his primary care physician and other specialties. -He is on coumadin  Plan -If Dr.  Florene Glen agrees, he will have his CBC and Aranesp injection at the dialysis center -I'll only see him as needed in the future.  He knows to call me if he has any questions or concerns.  I spent 15 minutes counseling the patient face to face. The total time spent in the appointment was 20 minutes and more than 50% was on counseling.     Truitt Merle, MD 07/30/2015 1:02 PM

## 2015-07-30 NOTE — Telephone Encounter (Signed)
per pof no f/u needed for now/nothing sch

## 2015-08-01 ENCOUNTER — Encounter (HOSPITAL_COMMUNITY): Payer: Medicare HMO

## 2015-08-01 ENCOUNTER — Encounter (HOSPITAL_COMMUNITY): Payer: Self-pay | Admitting: *Deleted

## 2015-08-01 NOTE — Progress Notes (Signed)
Anesthesia Chart Review: SAME DAY WORK-UP.  Patient is a 64 year old male scheduled for left arm AVF versus basilic vein transposition versus AVGG on 08/03/15 by Dr. Scot Dock.  History includes ischemic cardiomyopathy, chronic systolic and diastolic CHF, s/p "Biotronik" dual chamber AICD/pacemaker 06/29/14 [facility], Dr. Gabriel Carina), CAD s/p LAD/RCA PCI '07 and in '09 [facility]), s/p DES LAD and CTO RCA '14 and s/p balloon angioplasty for severe re-instent restenosis of LAD stents 05/18/15 ([facility]) following PEA arrest 05/05/15 (cardiac [place], CPR started, AED pulseless wide complex PEA, ROSC after 20 minutes of ACLS by EMS; transferred to [facility] with [facility] airway and cooling; no ICD shocks on interrogation), PAF (afib/flutter) s/p cardioversion 06/2014, HLD, HTN, DM2 with retinopathy and nephropathy, ESRD, CVA with residual right sided weakness ~ 2010, anemia, PVD. Nephrologist is Dr. Florene Glen. PCP is Dr. Rico Junker [facility]; see Care Everywhere).  Hematologist is Dr. Truitt Merle, last seen on 07/30/15 for anemia felt to be related to his renal disease. PRN follow-up recommended with continued periodic CBC and Aranesp at his dialysis center.  Cardiologist is Dr. [name], last visit 06/19/15. There are telephone encounters from 06/28/15 indicating that he is aware of plans for HD access surgery.  Meds include albuterol, amiodarone, Lipitor, Coreg, Plavix, warfarin, Humalog, lisinopril, Claritin, warfarin. Dr. Nicole Cella staff communicated with [facility] Coumadin clinic and Dr. Aundra Dubin about upcoming surgery. Dr. Aundra Dubin felt patient would be very difficult to bridge off warfarin, so he wanted to minimize time off warfarin. Instructions were given to hold Coumadin 4 days preoperatively and Plavix for 5 days--however, patient admitted to stopping them both ~ 07/23/15.   06/19/15 EKG: NSR, LAD, non-specific intraventricular conduction block,  non-specific T wave abnormality.   06/25/15 Echo: Study Conclusions - Left ventricle: The cavity size was mildly dilated. Wall thickness was increased in a pattern of mild LVH. Systolic function was severely reduced. The estimated ejection fraction was 15%. Diffuse hypokinesis. Doppler parameters are consistent with restrictive physiology, indicative of decreased left ventricular diastolic compliance and/or increased left atrial pressure. Doppler parameters are consistent with high ventricular filling pressure. - Mitral valve: There was mild to moderate regurgitation. - Left atrium: The atrium was severely dilated. - Right ventricle: Systolic function was severely reduced. - Right atrium: The atrium was moderately dilated. - Pulmonary arteries: Systolic pressure was severely increased. PA peak pressure: 63 mm Hg (S). Impressions: Severe global reduction in LV function; restrictive filling; severe LAE; mild RAE; severely reduced RV function; mild to moderate MR; mild TR; severely elevated pulmonary pressure. (Prior echo from 05/06/15 in [place] showed LVEF 15-20% just after his PEA arrest and prior to that was 25-30% on 04/02/14.)  05/18/15 Coronary angiogram (Care Everywhere): The left main trunk has 10% stenosis. The left anterior descending artery is stented from the proximal/midvessel junction almost to the apex. In some areas it appears there are two layers of stents. In the mid vessel there is in stent restenosis of 80%, length 25mm, TIMI3 flow. More distally there is 70-80% stenosis, length 37mm, TIMI 3 flow. The circumflex artery is comprised mostly of a bifurcating lateral OM branch. There is 20% mid vessel stenosis. The right coronary artery is dominant. There are stents in the mid and distal vessel. The RCA is occluded proximally. HEMODYNAMICS: Left ventricular pressure 110/30 mm Hg. Central aortic presssure 110/60 mm Hg. IMPRESSION: Severe in stent restenosis of the LAD stents, the mid  vessel lesion treated  with a 2.35mm Robinson ballon with good result, the distal lesions did not respond to cutting balloon angioplasty. Mild circumflex stenosis. Occluded RCA with left to right collaterals. Moderately elevated left sided filling pressure. No gradient across the aortic valve. RECOMMENDATIONS: 1. Aspirin and Plavix for a month, in addition to the warfarin.   Labs (CBC with dffi) from 07/30/15 noted. He will need an ISTAT4 on arrival.   Per PAT phone RN notes, patient reported doing well since discharge from SNF and denied CP or SOB. Reviewed above with anesthesiologist Dr. Tobias Alexander. Further evaluation by his assigned anesthesiologist on the day of surgery to ensure no acute changes and that labs are acceptable before proceeding.  George Hugh [facility] Short Stay Center/Anesthesiology Phone [phone] 08/01/2015 4:22 PM

## 2015-08-01 NOTE — Progress Notes (Signed)
Pt has a long history of CAD with stents and Atrial fib, he has an ICD. In July, 2016 he had a cardiac arrest (PEA), CPR started and was revived. Had cath done in [place] with balloon procedure to LAD. Also started on dialysis while in the hospital.  Pt states he's been doing well since discharge from SNF and denies any recent chest pain or sob. Pt is on Plavix and Coumadin and he states he stopped it on 07/23/15. There is a note in EPIC that he was to stop it 4 days prior to surgery. He was seen in the Coumadin clinic on 07/27/15 and I spoke with Tony Freeman, nurse at the Coumadin clinic and she felt sure pt was still on Coumadin when they saw him on that day. I called pt's son Tony Freeman and he's not definite when pt stopped the Coumadin but he feels pretty sure it was around 07/23/15. Pt states his fasting blood sugar have been good, this AM he states it was 101. His last Hgb A1C was 5.4 on 06/01/15.

## 2015-08-01 NOTE — Progress Notes (Signed)
Faxed ICD prescription form to Dr. Ileene Musa office.

## 2015-08-02 MED ORDER — DEXTROSE 5 % IV SOLN
1.5000 g | INTRAVENOUS | Status: AC
  Administered 2015-08-03: 1.5 g via INTRAVENOUS
  Filled 2015-08-02: qty 1.5

## 2015-08-02 MED ORDER — SODIUM CHLORIDE 0.9 % IV SOLN
INTRAVENOUS | Status: DC
Start: 2015-08-03 — End: 2015-08-03
  Administered 2015-08-03: 10:00:00 via INTRAVENOUS

## 2015-08-02 MED ORDER — CHLORHEXIDINE GLUCONATE CLOTH 2 % EX PADS: 6.0000 | MEDICATED_PAD | Freq: Once | CUTANEOUS | Status: DC

## 2015-08-03 ENCOUNTER — Ambulatory Visit (HOSPITAL_COMMUNITY)
Admission: RE | Admit: 2015-08-03 | Discharge: 2015-08-03 | Disposition: A | Discharge status: Home / Self Care | Payer: Medicare HMO | Source: Ambulatory Visit | Attending: Vascular Surgery | Admitting: Vascular Surgery

## 2015-08-03 ENCOUNTER — Ambulatory Visit (HOSPITAL_COMMUNITY): Payer: Medicare HMO | Admitting: Vascular Surgery

## 2015-08-03 ENCOUNTER — Other Ambulatory Visit: Payer: Self-pay | Admitting: *Deleted

## 2015-08-03 ENCOUNTER — Encounter (HOSPITAL_COMMUNITY): Payer: Self-pay | Admitting: Certified Registered Nurse Anesthetist

## 2015-08-03 ENCOUNTER — Encounter (HOSPITAL_COMMUNITY)
Admission: RE | Disposition: A | Discharge status: Home / Self Care | Payer: Self-pay | Source: Ambulatory Visit | Attending: Vascular Surgery

## 2015-08-03 DIAGNOSIS — I70208 Unspecified atherosclerosis of native arteries of extremities, other extremity: Secondary | ICD-10-CM | POA: Diagnosis not present

## 2015-08-03 DIAGNOSIS — I120 Hypertensive chronic kidney disease with stage 5 chronic kidney disease or end stage renal disease: Secondary | ICD-10-CM | POA: Insufficient documentation

## 2015-08-03 DIAGNOSIS — Z95810 Presence of automatic (implantable) cardiac defibrillator: Secondary | ICD-10-CM | POA: Insufficient documentation

## 2015-08-03 DIAGNOSIS — I5042 Chronic combined systolic (congestive) and diastolic (congestive) heart failure: Secondary | ICD-10-CM | POA: Diagnosis not present

## 2015-08-03 DIAGNOSIS — N186 End stage renal disease: Secondary | ICD-10-CM | POA: Insufficient documentation

## 2015-08-03 DIAGNOSIS — E11319 Type 2 diabetes mellitus with unspecified diabetic retinopathy without macular edema: Secondary | ICD-10-CM | POA: Diagnosis not present

## 2015-08-03 DIAGNOSIS — Z4931 Encounter for adequacy testing for hemodialysis: Secondary | ICD-10-CM

## 2015-08-03 DIAGNOSIS — N185 Chronic kidney disease, stage 5: Secondary | ICD-10-CM | POA: Diagnosis not present

## 2015-08-03 DIAGNOSIS — E785 Hyperlipidemia, unspecified: Secondary | ICD-10-CM | POA: Diagnosis not present

## 2015-08-03 DIAGNOSIS — Z992 Dependence on renal dialysis: Secondary | ICD-10-CM | POA: Insufficient documentation

## 2015-08-03 HISTORY — PX: AV FISTULA PLACEMENT: SHX1204

## 2015-08-03 HISTORY — DX: Unspecified asthma, uncomplicated: J45.909

## 2015-08-03 HISTORY — DX: Constipation, unspecified: K59.00

## 2015-08-03 HISTORY — DX: Personal history of sudden cardiac arrest: Z86.74

## 2015-08-03 LAB — APTT: aPTT: 32 seconds (ref 24–37)

## 2015-08-03 LAB — POCT I-STAT 4, (NA,K, GLUC, HGB,HCT)
Glucose, Bld: 99 mg/dL (ref 65–99)
HCT: 35 % — ABNORMAL LOW (ref 39.0–52.0)
HEMOGLOBIN: 11.9 g/dL — AB (ref 13.0–17.0)
POTASSIUM: 4.1 mmol/L (ref 3.5–5.1)
Sodium: 139 mmol/L (ref 135–145)

## 2015-08-03 LAB — GLUCOSE, CAPILLARY: GLUCOSE-CAPILLARY: 111 mg/dL — AB (ref 65–99)

## 2015-08-03 LAB — PROTIME-INR
INR: 1.29 (ref 0.00–1.49)
PROTHROMBIN TIME: 16.3 s — AB (ref 11.6–15.2)

## 2015-08-03 SURGERY — ARTERIOVENOUS (AV) FISTULA CREATION
Anesthesia: Monitor Anesthesia Care | Laterality: Left

## 2015-08-03 MED ORDER — HEPARIN SODIUM (PORCINE) 1000 UNIT/ML IJ SOLN
INTRAMUSCULAR | Status: AC
  Filled 2015-08-03: qty 1

## 2015-08-03 MED ORDER — HEPARIN SODIUM (PORCINE) 1000 UNIT/ML IJ SOLN
INTRAMUSCULAR | Status: DC | PRN
  Administered 2015-08-03: 7000 [IU] via INTRAVENOUS

## 2015-08-03 MED ORDER — PROTAMINE SULFATE 10 MG/ML IV SOLN
INTRAVENOUS | Status: DC | PRN
  Administered 2015-08-03: 40 mg via INTRAVENOUS

## 2015-08-03 MED ORDER — THROMBIN 20000 UNITS EX SOLR
CUTANEOUS | Status: AC
  Filled 2015-08-03: qty 20000

## 2015-08-03 MED ORDER — FENTANYL CITRATE (PF) 250 MCG/5ML IJ SOLN
INTRAMUSCULAR | Status: AC
  Filled 2015-08-03: qty 5

## 2015-08-03 MED ORDER — FENTANYL CITRATE (PF) 100 MCG/2ML IJ SOLN
INTRAMUSCULAR | Status: DC | PRN
  Administered 2015-08-03: 50 ug via INTRAVENOUS

## 2015-08-03 MED ORDER — MIDAZOLAM HCL 5 MG/5ML IJ SOLN
INTRAMUSCULAR | Status: DC | PRN
  Administered 2015-08-03 (×2): 1 mg via INTRAVENOUS

## 2015-08-03 MED ORDER — MIDAZOLAM HCL 2 MG/2ML IJ SOLN
INTRAMUSCULAR | Status: AC
  Filled 2015-08-03: qty 2

## 2015-08-03 MED ORDER — DEXMEDETOMIDINE HCL IN NACL 200 MCG/50ML IV SOLN
INTRAVENOUS | Status: DC | PRN
  Administered 2015-08-03: 0.4 ug/kg/h via INTRAVENOUS

## 2015-08-03 MED ORDER — PROTAMINE SULFATE 10 MG/ML IV SOLN
INTRAVENOUS | Status: AC
  Filled 2015-08-03: qty 5

## 2015-08-03 MED ORDER — OXYCODONE HCL 5 MG PO TABS: 5.0000 mg | ORAL_TABLET | Freq: Four times a day (QID) | ORAL | Status: DC | PRN

## 2015-08-03 MED ORDER — PAPAVERINE HCL 30 MG/ML IJ SOLN
INTRAMUSCULAR | Status: AC
  Filled 2015-08-03: qty 2

## 2015-08-03 MED ORDER — LIDOCAINE HCL (PF) 1 % IJ SOLN
INTRAMUSCULAR | Status: DC | PRN
  Administered 2015-08-03: 24 mL

## 2015-08-03 MED ORDER — SODIUM CHLORIDE 0.9 % IV SOLN
INTRAVENOUS | Status: DC | PRN
  Administered 2015-08-03: 11:00:00

## 2015-08-03 MED ORDER — 0.9 % SODIUM CHLORIDE (POUR BTL) OPTIME
TOPICAL | Status: DC | PRN
  Administered 2015-08-03: 2000 mL

## 2015-08-03 MED ORDER — EPHEDRINE SULFATE 50 MG/ML IJ SOLN
INTRAMUSCULAR | Status: DC | PRN
  Administered 2015-08-03: 10 mg via INTRAVENOUS

## 2015-08-03 MED ORDER — DEXMEDETOMIDINE HCL IN NACL 200 MCG/50ML IV SOLN
INTRAVENOUS | Status: AC
  Filled 2015-08-03: qty 50

## 2015-08-03 SURGICAL SUPPLY — 29 items
ARMBAND PINK RESTRICT EXTREMIT (MISCELLANEOUS) ×3 IMPLANT
CANISTER SUCTION 2500CC (MISCELLANEOUS) ×3 IMPLANT
CANNULA VESSEL 3MM 2 BLNT TIP (CANNULA) ×3 IMPLANT
CLIP TI MEDIUM 6 (CLIP) ×6 IMPLANT
CLIP TI WIDE RED SMALL 6 (CLIP) ×6 IMPLANT
COVER PROBE W GEL 5X96 (DRAPES) IMPLANT
DECANTER SPIKE VIAL GLASS SM (MISCELLANEOUS) IMPLANT
ELECT REM PT RETURN 9FT ADLT (ELECTROSURGICAL) ×3
ELECTRODE REM PT RTRN 9FT ADLT (ELECTROSURGICAL) ×1 IMPLANT
GLOVE BIO SURGEON STRL SZ7.5 (GLOVE) ×6 IMPLANT
GLOVE BIOGEL PI IND STRL 8 (GLOVE) ×2 IMPLANT
GLOVE BIOGEL PI INDICATOR 8 (GLOVE) ×4
GOWN STRL REUS W/ TWL LRG LVL3 (GOWN DISPOSABLE) ×3 IMPLANT
GOWN STRL REUS W/TWL LRG LVL3 (GOWN DISPOSABLE) ×6
KIT BASIN OR (CUSTOM PROCEDURE TRAY) ×3 IMPLANT
KIT ROOM TURNOVER OR (KITS) ×3 IMPLANT
LIQUID BAND (GAUZE/BANDAGES/DRESSINGS) ×3 IMPLANT
NS IRRIG 1000ML POUR BTL (IV SOLUTION) ×3 IMPLANT
PACK CV ACCESS (CUSTOM PROCEDURE TRAY) ×3 IMPLANT
PAD ARMBOARD 7.5X6 YLW CONV (MISCELLANEOUS) ×6 IMPLANT
SPONGE SURGIFOAM ABS GEL 100 (HEMOSTASIS) IMPLANT
SUT PROLENE 6 0 BV (SUTURE) ×6 IMPLANT
SUT SILK 3 0 (SUTURE) ×2
SUT SILK 3-0 18XBRD TIE 12 (SUTURE) ×1 IMPLANT
SUT VIC AB 3-0 SH 27 (SUTURE) ×4
SUT VIC AB 3-0 SH 27X BRD (SUTURE) ×2 IMPLANT
SUT VICRYL 4-0 PS2 18IN ABS (SUTURE) ×6 IMPLANT
UNDERPAD 30X30 INCONTINENT (UNDERPADS AND DIAPERS) ×3 IMPLANT
WATER STERILE IRR 1000ML POUR (IV SOLUTION) ×3 IMPLANT

## 2015-08-03 NOTE — Progress Notes (Signed)
This nurse called Tony Freeman, Biotronik rep, was instructed to place emergency orders on chart and to place magnet over ICD during procedure and that ICD could be checked remotely after procedure if needed. Tony Freeman also reported that procedure should not interfere with device. This nurse also called and informed Dr. Veatrice Kells of this information.

## 2015-08-03 NOTE — Op Note (Signed)
    NAME: Tony Freeman  MRN: [id number] DOB: [birth year]    DATE OF OPERATION: 08/03/2015  PREOP DIAGNOSIS: Stage V chronic kidney disease  POSTOP DIAGNOSIS: Same  PROCEDURE:  1. Exploration of left radial artery 2. Left brachiocephalic AV fistula  SURGEON: Judeth Cornfield. Scot Dock, MD, FACS  ASSIST: Leontine Locket, PA  ANESTHESIA: local with sedation   EBL: minimal  INDICATIONS: Tony Freeman is a 64 y.o. male he presents for new access.  FINDINGS: I explored the left radial artery which was markedly calcified and therefore he was not a candidate for a left radiocephalic fistula. The upper arm cephalic vein was 4 mm.  TECHNIQUE: The patient was taken to the operating and sedated by anesthesia. The left upper extremity was prepped and draped in usual sterile fashion. Although the forearm cephalic vein appeared adequate at a difficult time palpating the radial pulse in the left and I suspected that it might be calcified. He did have a triphasic signal at the wrist however so I elected to explore this. After the skin was anesthetized, a longitudinal incision was made over the radial artery was dissected free beneath the fascia and was markedly calcified. Therefore, I did not think he was a candidate for a left radiocephalic fistula. I therefore elected to proceed with a brachiocephalic fistula.  After the skin was anesthetized, and a transverse incision was made just above the antecubital level. Here the cephalic vein was dissected free. Branches were divided between clips and 3-0 silk ties. The vein was ligated distally and irrigated up with heparinized saline. The brachial artery was dissected free beneath the fascia. The patient was heparinized. The brachial artery was clamped proximally and distally and a longitudinal arteriotomy was made. The vein was mobilized over and sewn end-to-side to the artery using continuous 60 proline suture. At the completion there was an  excellent thrill in the fistula. There was a good radial signal with Doppler. The heparin was partially reversed with protamine. Hemostasis was obtained in both wounds.  At the wrist, the wound was closed with a clear 3-0 Vicryl and the skin closed with 4-0 Vicryl. LiquiBand was applied. The antecubital incision was closed with a deep layer of 3-0 Vicryl and the skin closed with 4-0 Vicryl. LiquiBand was applied. The patient tolerated the procedure well and was transferred to the recovery room in stable condition. All needle and sponge counts were correct.  Deitra Mayo, MD, FACS [facility]  DATE OF DICTATION:   08/03/2015

## 2015-08-03 NOTE — Addendum Note (Signed)
Addendum  created 08/03/15 1550 by Jillyn Hidden, MD   Modules edited: Notes Section   Notes Section:  File: [id number]

## 2015-08-03 NOTE — Transfer of Care (Signed)
Immediate Anesthesia Transfer of Care Note  Patient: Shizuo Biskup Kilman  Procedure(s) Performed: Procedure(s): ARTERIOVENOUS (AV) FISTULA CREATION VERSUS BASILIC VEIN TRANSPOSITION VERSUS ARTERIOVENOUS GRAFT INSERTION (Left)  Patient Location: PACU  Anesthesia Type:MAC  Level of Consciousness: awake, oriented, patient cooperative and lethargic  Airway & Oxygen Therapy: Patient Spontanous Breathing and Patient connected to nasal cannula oxygen  Post-op Assessment: Report given to RN, Post -op Vital signs reviewed and stable and Patient moving all extremities  Post vital signs: Reviewed and stable  Last Vitals:  BP 114/63 Hr 67 SpO2 98% on 2L [place] RR 18  Complications: No apparent anesthesia complications

## 2015-08-03 NOTE — Anesthesia Postprocedure Evaluation (Addendum)
  Anesthesia Post-op Note  Patient: Johathan Province Brue  Procedure(s) Performed: Procedure(s) (LRB): ARTERIOVENOUS (AV) FISTULA CREATION VERSUS BASILIC VEIN TRANSPOSITION VERSUS ARTERIOVENOUS GRAFT INSERTION (Left)  Patient Location: PACU  Anesthesia Type: MAC  Level of Consciousness: awake and alert   Airway and Oxygen Therapy: Patient Spontanous Breathing  Post-op Pain: mild  Post-op Assessment: Post-op Vital signs reviewed, Patient's Cardiovascular Status Stable, Respiratory Function Stable, Patent Airway and No signs of Nausea or vomiting  Last Vitals:  Filed Vitals:   08/03/15 1353  BP: 110/67  Pulse: 61  Temp:   Resp: 20    Post-op Vital Signs: stable   Complications: No apparent anesthesia complications Patient's ICD to be evaluated daily as usual from home, controlled remotely No intraop issues

## 2015-08-03 NOTE — Anesthesia Procedure Notes (Signed)
Procedure Name: MAC Date/Time: 08/03/2015 10:38 AM Performed by: Willeen Cass P Pre-anesthesia Checklist: Patient identified, Timeout performed, Emergency Drugs available, Suction available and Patient being monitored Patient Re-evaluated:Patient Re-evaluated prior to inductionOxygen Delivery Method: Nasal cannula Intubation Type: IV induction Placement Confirmation: positive ETCO2 Dental Injury: Teeth and Oropharynx as per pre-operative assessment

## 2015-08-03 NOTE — Anesthesia Preprocedure Evaluation (Addendum)
Anesthesia Evaluation  Patient identified by MRN, date of birth, ID band Patient awake    Reviewed: Allergy & Precautions, H&P , NPO status , Patient's Chart, lab work & pertinent test results  History of Anesthesia Complications Negative for: history of anesthetic complications  Airway Mallampati: II  TM Distance: >3 FB Neck ROM: full    Dental no notable dental hx.    Pulmonary asthma ,    Pulmonary exam normal breath sounds clear to auscultation       Cardiovascular hypertension, Pt. on medications + CAD, + Past MI, + Peripheral Vascular Disease and +CHF  Normal cardiovascular exam+ dysrhythmias  Rhythm:regular Rate:Normal  Most recent echo with severe left and right heart dysfunction, EF is <15% for left and right   Neuro/Psych CVA    GI/Hepatic negative GI ROS, Neg liver ROS,   Endo/Other  negative endocrine ROSdiabetes  Renal/GU negative Renal ROS     Musculoskeletal   Abdominal   Peds  Hematology  (+) anemia ,   Anesthesia Other Findings   Reproductive/Obstetrics negative OB ROS                           Anesthesia Physical Anesthesia Plan  ASA: IV  Anesthesia Plan: MAC   Post-op Pain Management:    Induction: Intravenous  Airway Management Planned: Simple Face Mask  Additional Equipment: None  Intra-op Plan:   Post-operative Plan:   Informed Consent: I have reviewed the patients History and Physical, chart, labs and discussed the procedure including the risks, benefits and alternatives for the proposed anesthesia with the patient or authorized representative who has indicated his/her understanding and acceptance.   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 Risk of MACE with any procedure is significant given  renal failure, CAD, systolic heart function that is abysmal in addition to severely elevated pulmonary pressures that have now resulted in right heart dysfunction due a severe degree. Will proceed with surgery given no acute symptoms and normal labs since it is required for vascular access to hemodialysis. Patient can not be further optimized medically  [facility] nursing spoke with EP ICD rep and plan will be to place magnet and they will re-activate remotely in pacu)     Anesthesia Quick Evaluation

## 2015-08-03 NOTE — Discharge Instructions (Signed)
° ° °  08/03/2015 Tony Freeman [id number] 10-07-1951  Surgeon(s): Angelia Mould, MD  Procedure(s): Left Brachiocephalic AV fistula.  x Do not stick fistula for 12 weeks

## 2015-08-06 ENCOUNTER — Encounter (HOSPITAL_COMMUNITY): Payer: Self-pay | Admitting: Vascular Surgery

## 2015-08-06 ENCOUNTER — Telehealth: Payer: Self-pay | Admitting: Vascular Surgery

## 2015-08-06 NOTE — Telephone Encounter (Addendum)
-----   Message from Mena Goes, RN sent at 08/03/2015  1:06 PM EDT ----- Regarding: schedule   ----- Message -----    From: Angelia Mould, MD    Sent: 08/03/2015  12:38 PM      To: [facility] Charge Pool Subject: charge and f/u                                 PROCEDURE:  1. Exploration of left radial artery 2. Left brachiocephalic AV fistula  SURGEON: Judeth Cornfield. Scot Dock, MD, FACS  ASSIST: Leontine Locket, PA  He will need a follow up visit in 6 weeks with a duplex to check on the maturation of his fistula. Thank you. CD   notified patient of post op appt. on 09-26-15 at 3PM  and 3:45 with dr. Scot Dock

## 2015-08-07 ENCOUNTER — Ambulatory Visit (INDEPENDENT_AMBULATORY_CARE_PROVIDER_SITE_OTHER): Payer: Medicare HMO | Admitting: *Deleted

## 2015-08-07 DIAGNOSIS — I4891 Unspecified atrial fibrillation: Secondary | ICD-10-CM | POA: Diagnosis not present

## 2015-08-07 DIAGNOSIS — Z5181 Encounter for therapeutic drug level monitoring: Secondary | ICD-10-CM

## 2015-08-07 LAB — POCT INR: INR: 1.3

## 2015-08-13 ENCOUNTER — Ambulatory Visit (INDEPENDENT_AMBULATORY_CARE_PROVIDER_SITE_OTHER): Payer: Medicare HMO | Admitting: *Deleted

## 2015-08-13 DIAGNOSIS — I4891 Unspecified atrial fibrillation: Secondary | ICD-10-CM

## 2015-08-13 DIAGNOSIS — Z5181 Encounter for therapeutic drug level monitoring: Secondary | ICD-10-CM | POA: Diagnosis not present

## 2015-08-13 LAB — POCT INR: INR: 1.9

## 2015-08-16 ENCOUNTER — Ambulatory Visit (HOSPITAL_COMMUNITY): Payer: Medicare HMO

## 2015-08-20 ENCOUNTER — Ambulatory Visit (HOSPITAL_COMMUNITY): Payer: Medicare HMO

## 2015-08-22 ENCOUNTER — Ambulatory Visit (HOSPITAL_COMMUNITY): Payer: Medicare HMO

## 2015-08-22 ENCOUNTER — Ambulatory Visit (INDEPENDENT_AMBULATORY_CARE_PROVIDER_SITE_OTHER): Payer: Medicare HMO | Admitting: Pharmacist

## 2015-08-22 DIAGNOSIS — Z5181 Encounter for therapeutic drug level monitoring: Secondary | ICD-10-CM

## 2015-08-22 DIAGNOSIS — I4891 Unspecified atrial fibrillation: Secondary | ICD-10-CM

## 2015-08-22 LAB — POCT INR: INR: 3.2

## 2015-08-24 ENCOUNTER — Ambulatory Visit (HOSPITAL_COMMUNITY): Payer: Medicare HMO

## 2015-08-27 ENCOUNTER — Ambulatory Visit (HOSPITAL_COMMUNITY): Payer: Medicare HMO

## 2015-08-29 ENCOUNTER — Ambulatory Visit (HOSPITAL_COMMUNITY): Payer: Medicare HMO

## 2015-08-30 ENCOUNTER — Encounter (HOSPITAL_COMMUNITY)
Admission: RE | Admit: 2015-08-30 | Discharge: 2015-08-30 | Disposition: A | Discharge status: Home / Self Care | Payer: Medicare HMO | Source: Ambulatory Visit | Attending: Cardiology | Admitting: Cardiology

## 2015-08-30 DIAGNOSIS — Z9861 Coronary angioplasty status: Secondary | ICD-10-CM | POA: Insufficient documentation

## 2015-08-30 NOTE — Progress Notes (Signed)
Cardiac Rehab Medication Review by a Pharmacist  Does the patient  feel that his/her medications are working for him/her?  yes  Has the patient been experiencing any side effects to the medications prescribed?  no  Does the patient measure his/her own blood pressure or blood glucose at home?  no   Does the patient have any problems obtaining medications due to transportation or finances?   no  Understanding of regimen: poor Understanding of indications: poor Potential of compliance: good    Pharmacist comments: Spoke with patient and patients son about the medication doses and indications. Patient was rather non-verbal and seemed fatigued. His son helps him with his medication regimen and has a much better understand of the medications than his father. We spoke a bit about his warfarin dosing and what to monitor. We also talked about his decreased energy levels and how to help him become more active. Pt is going back to the warfarin clinic tomorrow to have his INR read. Neither the patient nor his son had any additional medcation questions.    Tony Freeman C. Lennox Grumbles, PharmD Pharmacy Resident  Pager: [phone] 08/30/2015 8:39 AM

## 2015-08-31 ENCOUNTER — Ambulatory Visit (HOSPITAL_COMMUNITY): Payer: Medicare HMO

## 2015-08-31 ENCOUNTER — Ambulatory Visit (INDEPENDENT_AMBULATORY_CARE_PROVIDER_SITE_OTHER): Payer: Medicare HMO | Admitting: *Deleted

## 2015-08-31 DIAGNOSIS — I4891 Unspecified atrial fibrillation: Secondary | ICD-10-CM | POA: Diagnosis not present

## 2015-08-31 DIAGNOSIS — Z5181 Encounter for therapeutic drug level monitoring: Secondary | ICD-10-CM | POA: Diagnosis not present

## 2015-08-31 LAB — POCT INR: INR: 2.6

## 2015-09-03 ENCOUNTER — Encounter (HOSPITAL_COMMUNITY): Payer: Medicare HMO

## 2015-09-03 ENCOUNTER — Ambulatory Visit (HOSPITAL_COMMUNITY): Payer: Medicare HMO

## 2015-09-05 ENCOUNTER — Ambulatory Visit (HOSPITAL_COMMUNITY): Payer: Medicare HMO

## 2015-09-05 ENCOUNTER — Encounter (HOSPITAL_COMMUNITY)
Admission: RE | Admit: 2015-09-05 | Discharge: 2015-09-05 | Disposition: A | Discharge status: Home / Self Care | Payer: Medicare HMO | Source: Ambulatory Visit | Attending: Cardiology | Admitting: Cardiology

## 2015-09-05 DIAGNOSIS — Z9861 Coronary angioplasty status: Secondary | ICD-10-CM | POA: Diagnosis present

## 2015-09-05 LAB — GLUCOSE, CAPILLARY
Glucose-Capillary: 188 mg/dL — ABNORMAL HIGH (ref 65–99)
Glucose-Capillary: 174 mg/dL — ABNORMAL HIGH (ref 65–99)

## 2015-09-05 NOTE — Progress Notes (Signed)
Pt started exercise in the 11:15 Phase II Cardiac rehab today.  Pt tolerated light exercise with some difficulty particularly with walking.  Pt walked from the main entrance of the hospital which is about a 1/4 mile.  Pt was advised to please use guest service to assist him by wheelchair.  Using guest service would allow him to use his energy in rehab verses become depleted from the walk over. . VSS, blood glucose were stable.  Pt given peanut butter and crackers during exercise, telemetry-SR with first degree AV block and inverted T wave, asymptomatic.  Medication list reconciled.  Pt verbalized compliance with medications and denies barriers to compliance. PSYCHOSOCIAL ASSESSMENT:  PHQ-0. Pt exhibits positive coping skills, hopeful outlook with supportive family. Pt son helps him with getting to dialysis and was present at the orientation appt.  Pt also uses [facility] transportation services. No psychosocial needs identified at this time, no psychosocial interventions necessary.    Pt enjoys watching football on TV.   Pt cardiac rehab  goal is  to better health and increase stamina walk longer and feel good about self. Pt admits that due to dialysis three times a week he doesn't have much energy.  Pt has not exercised prior to attending cardiac rehab. Pt encouraged to participate in education classes particular home exercise and functional fitness to increase ability to achieve these goals.   Pt long term cardiac rehab goal is get in better shape.  Pt will receive home exercise instruction by the exercise physiologist to maximize his energy on opposite days of dialysis.Pt oriented to exercise equipment and routine.  Understanding verbalized.  Cherre Huger, BSN

## 2015-09-07 ENCOUNTER — Encounter (HOSPITAL_COMMUNITY)
Admission: RE | Admit: 2015-09-07 | Discharge: 2015-09-07 | Disposition: A | Discharge status: Home / Self Care | Payer: Medicare HMO | Source: Ambulatory Visit | Attending: Cardiology | Admitting: Cardiology

## 2015-09-07 ENCOUNTER — Ambulatory Visit (HOSPITAL_COMMUNITY): Payer: Medicare HMO

## 2015-09-07 DIAGNOSIS — Z9861 Coronary angioplasty status: Secondary | ICD-10-CM | POA: Diagnosis not present

## 2015-09-07 LAB — GLUCOSE, CAPILLARY
GLUCOSE-CAPILLARY: 157 mg/dL — AB (ref 65–99)
Glucose-Capillary: 166 mg/dL — ABNORMAL HIGH (ref 65–99)

## 2015-09-10 ENCOUNTER — Ambulatory Visit (HOSPITAL_COMMUNITY): Payer: Medicare HMO

## 2015-09-10 ENCOUNTER — Encounter (HOSPITAL_COMMUNITY): Payer: Medicare HMO

## 2015-09-12 ENCOUNTER — Ambulatory Visit (HOSPITAL_COMMUNITY): Payer: Medicare HMO

## 2015-09-12 ENCOUNTER — Ambulatory Visit (INDEPENDENT_AMBULATORY_CARE_PROVIDER_SITE_OTHER): Payer: Medicare HMO | Admitting: Pharmacist

## 2015-09-12 ENCOUNTER — Encounter (HOSPITAL_COMMUNITY)
Admission: RE | Admit: 2015-09-12 | Discharge: 2015-09-12 | Disposition: A | Discharge status: Home / Self Care | Payer: Medicare HMO | Source: Ambulatory Visit | Attending: Cardiology | Admitting: Cardiology

## 2015-09-12 DIAGNOSIS — Z5181 Encounter for therapeutic drug level monitoring: Secondary | ICD-10-CM

## 2015-09-12 DIAGNOSIS — I4891 Unspecified atrial fibrillation: Secondary | ICD-10-CM | POA: Diagnosis not present

## 2015-09-12 DIAGNOSIS — Z9861 Coronary angioplasty status: Secondary | ICD-10-CM | POA: Diagnosis not present

## 2015-09-12 LAB — POCT INR: INR: 1.4

## 2015-09-12 LAB — GLUCOSE, CAPILLARY: GLUCOSE-CAPILLARY: 135 mg/dL — AB (ref 65–99)

## 2015-09-12 NOTE — Progress Notes (Signed)
Pt in today for exercise. Pt reported that he was out on Monday due to upset stomach.  Pt vomited x 1 but no diarrhea.  Pt had dialysis on Tuesday and indicated they pulled "more fluid off than usual"  I lost 3 pounds. Pt appears sluggish and tired.  Blood glucose checked 135.  Pt ate corn flakes and oj for breakfast.  Pt is on insulin and oral medications.  Exercise stopped due to fatigue and low energy.  Pt given peanut butter crackers and lemonade to drink.  Will not continue with exercise due to pt feeling tired.  Pt states that he did not sleep well and felt tired.  Pt son, [name], called and informed of pt condition and to please check on him later today.  Pt did not have his phone with him today, left at home.  Pt finished his snack and felt some better.  Parke Simmers, dietician and diabetes educator, consulted to talk with pt regarding other options to have for breakfast.  Pt escorted by wheel chair to [place] area for [facility] pick up.  Called at 3:20 and pt had arrived safely home and felt better.  Continue to monitor. Cherre Huger, BSN

## 2015-09-12 NOTE — Progress Notes (Signed)
Nutrition Note Spoke with pt per RN request. Pt ate cornflakes, milk and orange juice for breakfast. Pre-exercise CBG 135 mg/dL. Recommendations for appropriate pre-exercise meal discussed. Pt expressed understanding of the information discussed. Continue client-centered nutrition education by RD as part of interdisciplinary care.  Monitor and evaluate progress toward nutrition goal with team.  Derek Mound, M.Ed, RD, LDN, CDE 09/12/2015 3:30 PM

## 2015-09-14 ENCOUNTER — Ambulatory Visit (HOSPITAL_COMMUNITY): Payer: Medicare HMO

## 2015-09-14 ENCOUNTER — Encounter (HOSPITAL_COMMUNITY)
Admission: RE | Admit: 2015-09-14 | Discharge: 2015-09-14 | Disposition: A | Discharge status: Home / Self Care | Payer: Medicare HMO | Source: Ambulatory Visit | Attending: Cardiology | Admitting: Cardiology

## 2015-09-14 DIAGNOSIS — Z9861 Coronary angioplasty status: Secondary | ICD-10-CM | POA: Diagnosis not present

## 2015-09-14 LAB — GLUCOSE, CAPILLARY
Glucose-Capillary: 135 mg/dL — ABNORMAL HIGH (ref 65–99)
Glucose-Capillary: 143 mg/dL — ABNORMAL HIGH (ref 65–99)

## 2015-09-17 ENCOUNTER — Ambulatory Visit (HOSPITAL_COMMUNITY): Payer: Medicare HMO

## 2015-09-17 ENCOUNTER — Encounter (HOSPITAL_COMMUNITY)
Admission: RE | Admit: 2015-09-17 | Discharge: 2015-09-17 | Disposition: A | Discharge status: Home / Self Care | Payer: Medicare HMO | Source: Ambulatory Visit | Attending: Cardiology | Admitting: Cardiology

## 2015-09-17 DIAGNOSIS — Z9861 Coronary angioplasty status: Secondary | ICD-10-CM | POA: Diagnosis not present

## 2015-09-17 LAB — GLUCOSE, CAPILLARY
GLUCOSE-CAPILLARY: 149 mg/dL — AB (ref 65–99)
Glucose-Capillary: 117 mg/dL — ABNORMAL HIGH (ref 65–99)

## 2015-09-19 ENCOUNTER — Ambulatory Visit (HOSPITAL_COMMUNITY): Payer: Medicare HMO

## 2015-09-19 ENCOUNTER — Encounter (HOSPITAL_COMMUNITY)
Admission: RE | Admit: 2015-09-19 | Discharge: 2015-09-19 | Disposition: A | Discharge status: Home / Self Care | Payer: Medicare HMO | Source: Ambulatory Visit | Attending: Cardiology | Admitting: Cardiology

## 2015-09-19 DIAGNOSIS — Z9861 Coronary angioplasty status: Secondary | ICD-10-CM | POA: Diagnosis not present

## 2015-09-19 LAB — GLUCOSE, CAPILLARY
Glucose-Capillary: 183 mg/dL — ABNORMAL HIGH (ref 65–99)
Glucose-Capillary: 150 mg/dL — ABNORMAL HIGH (ref 65–99)

## 2015-09-24 ENCOUNTER — Encounter: Payer: Self-pay | Admitting: Vascular Surgery

## 2015-09-24 ENCOUNTER — Ambulatory Visit (HOSPITAL_COMMUNITY): Payer: Medicare HMO

## 2015-09-24 ENCOUNTER — Ambulatory Visit (INDEPENDENT_AMBULATORY_CARE_PROVIDER_SITE_OTHER): Payer: Medicare HMO | Admitting: *Deleted

## 2015-09-24 ENCOUNTER — Encounter (HOSPITAL_COMMUNITY)
Admission: RE | Admit: 2015-09-24 | Discharge: 2015-09-24 | Disposition: A | Discharge status: Home / Self Care | Payer: Medicare HMO | Source: Ambulatory Visit | Attending: Cardiology | Admitting: Cardiology

## 2015-09-24 DIAGNOSIS — Z9861 Coronary angioplasty status: Secondary | ICD-10-CM | POA: Diagnosis not present

## 2015-09-24 DIAGNOSIS — Z5181 Encounter for therapeutic drug level monitoring: Secondary | ICD-10-CM

## 2015-09-24 DIAGNOSIS — I4891 Unspecified atrial fibrillation: Secondary | ICD-10-CM | POA: Diagnosis not present

## 2015-09-24 LAB — POCT INR: INR: 2

## 2015-09-24 LAB — GLUCOSE, CAPILLARY
GLUCOSE-CAPILLARY: 152 mg/dL — AB (ref 65–99)
GLUCOSE-CAPILLARY: 141 mg/dL — AB (ref 65–99)

## 2015-09-26 ENCOUNTER — Ambulatory Visit (INDEPENDENT_AMBULATORY_CARE_PROVIDER_SITE_OTHER): Payer: Medicare HMO | Admitting: Vascular Surgery

## 2015-09-26 ENCOUNTER — Encounter: Payer: Self-pay | Admitting: Vascular Surgery

## 2015-09-26 ENCOUNTER — Encounter (HOSPITAL_COMMUNITY)
Admission: RE | Admit: 2015-09-26 | Discharge: 2015-09-26 | Disposition: A | Discharge status: Home / Self Care | Payer: Medicare HMO | Source: Ambulatory Visit | Attending: Cardiology | Admitting: Cardiology

## 2015-09-26 ENCOUNTER — Ambulatory Visit (HOSPITAL_COMMUNITY): Payer: Medicare HMO

## 2015-09-26 ENCOUNTER — Ambulatory Visit (HOSPITAL_COMMUNITY)
Admission: RE | Admit: 2015-09-26 | Discharge: 2015-09-26 | Disposition: A | Discharge status: Home / Self Care | Payer: Medicare HMO | Source: Ambulatory Visit | Attending: Vascular Surgery | Admitting: Vascular Surgery

## 2015-09-26 VITALS — BP 114/70 | HR 66 | Temp 98.2°F | Ht 65.0 in | Wt 178.0 lb

## 2015-09-26 DIAGNOSIS — N186 End stage renal disease: Secondary | ICD-10-CM | POA: Diagnosis not present

## 2015-09-26 DIAGNOSIS — Z9861 Coronary angioplasty status: Secondary | ICD-10-CM | POA: Diagnosis not present

## 2015-09-26 DIAGNOSIS — Z4931 Encounter for adequacy testing for hemodialysis: Secondary | ICD-10-CM | POA: Diagnosis not present

## 2015-09-26 DIAGNOSIS — N184 Chronic kidney disease, stage 4 (severe): Secondary | ICD-10-CM

## 2015-09-26 LAB — GLUCOSE, CAPILLARY
GLUCOSE-CAPILLARY: 146 mg/dL — AB (ref 65–99)
GLUCOSE-CAPILLARY: 143 mg/dL — AB (ref 65–99)

## 2015-09-26 NOTE — Progress Notes (Signed)
Patient name: Tony Freeman MRN: [id number] DOB: Oct 19, 1951 Sex: male  REASON FOR VISIT: follow up after left brachiocephalic AV fistula  HPI: Tony Freeman is a 64 y.o. male who underwent exploration of the left radial artery on 08/03/2015 and placement of the left brachiocephalic AV fistula. The radial artery was markedly calcified and therefore he was not a candidate for a radiocephalic fistula. The upper arm cephalic vein was 4 mm. He comes in for 6 week follow up visit. He denies pain or paresthesias in the left arm.  He dialyzes on Tuesdays Thursdays and Saturdays with a right IJ tunneled dialysis catheter.  Current Outpatient Prescriptions  Medication Sig Dispense Refill  . acetaminophen (TYLENOL) 325 MG tablet Take two tablets by mouth every 8 hours as needed for pain    . albuterol (PROVENTIL HFA;VENTOLIN HFA) 108 (90 BASE) MCG/ACT inhaler Inhale 2 puffs into the lungs every 6 (six) hours as needed for wheezing or shortness of breath.    Marland Kitchen albuterol (PROVENTIL) (2.5 MG/3ML) 0.083% nebulizer solution Take 2.5 mg by nebulization every 6 (six) hours as needed for wheezing or shortness of breath.    Marland Kitchen amiodarone (PACERONE) 100 MG tablet Take one tablet by mouth once daily for AFIB    . atorvastatin (LIPITOR) 40 MG tablet Take 1 tablet (40 mg total) by mouth daily. (Patient taking differently: Take 40 mg by mouth daily. cholesterol) 30 tablet 0  . B Complex-C-Folic Acid (NEPHRO-VITE PO) Take 1 tablet by mouth daily.    . carvedilol (COREG) 6.25 MG tablet Take one tablet by mouth twice daily for hypertension    . clopidogrel (PLAVIX) 75 MG tablet Take 1 tablet (75 mg total) by mouth daily. 30 tablet 3  . docusate sodium (COLACE) 100 MG capsule Take 100 mg by mouth daily.     . insulin lispro (HUMALOG) 100 UNIT/ML injection Inject 10 Units into the skin 3 (three) times daily before meals. For BS > 140    . lisinopril (PRINIVIL,ZESTRIL) 5 MG tablet Take 1 tablet (5 mg  total) by mouth daily. 30 tablet 6  . loratadine (CLARITIN) 10 MG tablet Take one tablet by mouth once daily for allergies    . Multiple Vitamin (MULTIVITAMIN) tablet Take 1 tablet by mouth daily.    Marland Kitchen oxyCODONE (ROXICODONE) 5 MG immediate release tablet Take 1 tablet (5 mg total) by mouth every 6 (six) hours as needed for severe pain. Take one tablet by mouth every 8 hours as needed for pain 30 tablet 0  . warfarin (COUMADIN) 5 MG tablet Take 1 tablet (5 mg total) by mouth as directed. 45 tablet 3   No current facility-administered medications for this visit.    REVIEW OF SYSTEMS:  [X]  denotes positive finding, [ ]  denotes negative finding Cardiac  Comments:  Chest pain or chest pressure:    Shortness of breath upon exertion:    Short of breath when lying flat:    Irregular heart rhythm:    Constitutional    Fever or chills:      PHYSICAL EXAM: Filed Vitals:   09/26/15 1548  BP: 114/70  Pulse: 66  Temp: 98.2 F (36.8 C)  TempSrc: Oral  Height: 5\' 5"  (1.651 m)  Weight: 178 lb (80.74 kg)  SpO2: 96%    GENERAL: The patient is a well-nourished male, in no acute distress. The vital signs are documented above. CARDIOVASCULAR: There is a regular rate and rhythm. PULMONARY: There is good air exchange bilaterally without wheezing  or rales. His left upper arm fistula has a good thrill just above the antecubital level but then the vein is more difficult to follow.  I have independently interpreted the duplex of his dialysis fistula. Diameters of the fistula range from 0.42-0.74 cm.  MEDICAL ISSUES:  POORLY MATURING LEFT UPPER ARM AV FISTULA: The diameters of the fistula range from 0.42-0.74 cm. The vein appears to branch in several areas and I'm concerned that this may not mature adequately. I discussed with him the option of proceeding with placement of an AV graft but he would like to give this more time and I do not think that this is unreasonable. Bring him back in 6 weeks for  follow up duplex scan. He knows to call sooner if he changes his mind. In the meantime he'll use his catheter for dialysis.  Deitra [facility]: [phone]

## 2015-09-26 NOTE — Addendum Note (Signed)
Addended by: Dorthula Rue L on: 09/26/2015 05:11 PM   Modules accepted: Orders

## 2015-09-26 NOTE — Progress Notes (Signed)
Tony Freeman 64 y.o. male Nutrition Note Spoke with pt. Nutrition Plan and Nutrition Survey goals reviewed with pt. Pt is following Step 2 of the Therapeutic Lifestyle Changes diet. Pt is diabetic. Last A1c indicates blood glucose not optimally controlled. Pt reports he has had his A1c re-drawn and "it was 6.6 or 6.7."This Probation officer went over Diabetes Education test results. Pt checks CBG's daily. Fasting CBG's reportedly 100-120 mg/dL. Pt with dx of CHF. Per discussion, pt does not use canned/convenience foods often. If canned foods are used, pt rinses canned foods. Pt rarely adds salt to food. Pt eats out infrequently. Pt is on Coumadin and is aware of the need for consistent intake of foods with vitamin K. Pt states "I can eat greens twice a week." Pt is obese. Pt denies the desire/need to lose wt at this time. Pt expressed understanding of the information reviewed. Pt aware of nutrition education classes offered and is unable to attend nutrition classes.  Lab Results  Component Value Date   HGBA1C 7.3* 01/24/2015    Nutrition Diagnosis ? Food-and nutrition-related knowledge deficit related to lack of exposure to information as related to diagnosis of: ? CVD ? DM ? Obesity related to excessive energy intake as evidenced by a BMI of 30.3  Nutrition RX/ Re-Estimated Daily Nutrition Needs for: wt maintenance 2200-2500  Kcal, 70-80 gm fat, 14-17 gm sat fat, 2.1-2.5 gm trans-fat, <1500 mg sodium, 250-325 gm CHO   Nutrition Intervention ? Pt's individual nutrition plan reviewed with pt. ? Benefits of adopting Therapeutic Lifestyle Changes discussed when Medficts reviewed. ? Pt to attend the Portion Distortion class ? Pt to attend the Diabetes Q & A class  ? Pt given handouts for: ? Nutrition I class ? Nutrition II class ? Diabetes Blitz Class  ? Continue client-centered nutrition education by RD, as part of interdisciplinary care. Goal(s) ? CBG concentrations in the normal range or as  close to normal as is safely possible. Monitor and Evaluate progress toward nutrition goal with team. Nutrition Risk: Change to Moderate Tony Freeman, M.Ed, RD, LDN, CDE 09/26/2015 1:46 PM

## 2015-09-28 ENCOUNTER — Ambulatory Visit (HOSPITAL_COMMUNITY): Payer: Medicare HMO

## 2015-09-28 ENCOUNTER — Encounter (HOSPITAL_COMMUNITY)
Admission: RE | Admit: 2015-09-28 | Discharge: 2015-09-28 | Disposition: A | Discharge status: Home / Self Care | Payer: Medicare HMO | Source: Ambulatory Visit | Attending: Cardiology | Admitting: Cardiology

## 2015-09-28 DIAGNOSIS — Z9861 Coronary angioplasty status: Secondary | ICD-10-CM | POA: Insufficient documentation

## 2015-09-28 LAB — GLUCOSE, CAPILLARY: GLUCOSE-CAPILLARY: 136 mg/dL — AB (ref 65–99)

## 2015-10-01 ENCOUNTER — Ambulatory Visit (HOSPITAL_COMMUNITY): Payer: Medicare HMO

## 2015-10-01 ENCOUNTER — Encounter (HOSPITAL_COMMUNITY)
Admission: RE | Admit: 2015-10-01 | Discharge: 2015-10-01 | Disposition: A | Discharge status: Home / Self Care | Payer: Medicare HMO | Source: Ambulatory Visit | Attending: Cardiology | Admitting: Cardiology

## 2015-10-01 DIAGNOSIS — Z9861 Coronary angioplasty status: Secondary | ICD-10-CM | POA: Diagnosis not present

## 2015-10-01 LAB — GLUCOSE, CAPILLARY: GLUCOSE-CAPILLARY: 130 mg/dL — AB (ref 65–99)

## 2015-10-01 NOTE — Progress Notes (Signed)
Reviewed home exercise with pt today.  Pt plans to walk at home and go to complex gym for exercise.  Reviewed THR, pulse, RPE, sign and symptoms, and when to call 911 or MD.  Pt voiced understanding. Alberteen Sam, MA, ACSM RCEP

## 2015-10-02 ENCOUNTER — Telehealth (HOSPITAL_COMMUNITY): Payer: Self-pay

## 2015-10-02 ENCOUNTER — Other Ambulatory Visit (HOSPITAL_COMMUNITY): Payer: Self-pay

## 2015-10-02 MED ORDER — NITROGLYCERIN 0.4 MG SL SUBL: 0.4000 mg | SUBLINGUAL_TABLET | SUBLINGUAL | Status: AC | PRN

## 2015-10-02 NOTE — Telephone Encounter (Signed)
Tony Freeman with cardiac rehab called requesting follow up for patient who has been given VO to start nitro q 5 min x 3 doses PRN for CP per Dr. Aundra Dubin.  Prescription sent electronically to preferred pharmacy.  Renee Pain

## 2015-10-03 ENCOUNTER — Ambulatory Visit (HOSPITAL_COMMUNITY): Payer: Medicare HMO

## 2015-10-03 ENCOUNTER — Encounter (HOSPITAL_COMMUNITY)
Admission: RE | Admit: 2015-10-03 | Discharge: 2015-10-03 | Disposition: A | Discharge status: Home / Self Care | Payer: Medicare HMO | Source: Ambulatory Visit | Attending: Cardiology | Admitting: Cardiology

## 2015-10-03 DIAGNOSIS — Z9861 Coronary angioplasty status: Secondary | ICD-10-CM | POA: Diagnosis not present

## 2015-10-03 LAB — GLUCOSE, CAPILLARY: Glucose-Capillary: 176 mg/dL — ABNORMAL HIGH (ref 65–99)

## 2015-10-04 ENCOUNTER — Telehealth (HOSPITAL_COMMUNITY): Payer: Self-pay

## 2015-10-04 NOTE — Telephone Encounter (Signed)
Patient called CHF triage line to verify apt time and length for transportation needs. Renee Pain

## 2015-10-05 ENCOUNTER — Encounter (HOSPITAL_COMMUNITY)
Admission: RE | Admit: 2015-10-05 | Discharge: 2015-10-05 | Disposition: A | Discharge status: Home / Self Care | Payer: Medicare HMO | Source: Ambulatory Visit | Attending: Cardiology | Admitting: Cardiology

## 2015-10-05 ENCOUNTER — Ambulatory Visit (HOSPITAL_COMMUNITY): Payer: Medicare HMO

## 2015-10-05 ENCOUNTER — Ambulatory Visit (HOSPITAL_COMMUNITY)
Admission: RE | Admit: 2015-10-05 | Discharge: 2015-10-05 | Disposition: A | Discharge status: Home / Self Care | Payer: Medicare HMO | Source: Ambulatory Visit | Attending: Cardiology | Admitting: Cardiology

## 2015-10-05 VITALS — BP 102/66 | HR 73 | Wt 179.8 lb

## 2015-10-05 DIAGNOSIS — I2510 Atherosclerotic heart disease of native coronary artery without angina pectoris: Secondary | ICD-10-CM | POA: Diagnosis not present

## 2015-10-05 DIAGNOSIS — Z7901 Long term (current) use of anticoagulants: Secondary | ICD-10-CM | POA: Diagnosis not present

## 2015-10-05 DIAGNOSIS — Z95810 Presence of automatic (implantable) cardiac defibrillator: Secondary | ICD-10-CM | POA: Diagnosis not present

## 2015-10-05 DIAGNOSIS — Z794 Long term (current) use of insulin: Secondary | ICD-10-CM | POA: Insufficient documentation

## 2015-10-05 DIAGNOSIS — I739 Peripheral vascular disease, unspecified: Secondary | ICD-10-CM | POA: Insufficient documentation

## 2015-10-05 DIAGNOSIS — N186 End stage renal disease: Secondary | ICD-10-CM

## 2015-10-05 DIAGNOSIS — Z955 Presence of coronary angioplasty implant and graft: Secondary | ICD-10-CM | POA: Diagnosis not present

## 2015-10-05 DIAGNOSIS — F0390 Unspecified dementia without behavioral disturbance: Secondary | ICD-10-CM | POA: Diagnosis not present

## 2015-10-05 DIAGNOSIS — I69351 Hemiplegia and hemiparesis following cerebral infarction affecting right dominant side: Secondary | ICD-10-CM | POA: Diagnosis not present

## 2015-10-05 DIAGNOSIS — I5022 Chronic systolic (congestive) heart failure: Secondary | ICD-10-CM | POA: Diagnosis not present

## 2015-10-05 DIAGNOSIS — I480 Paroxysmal atrial fibrillation: Secondary | ICD-10-CM

## 2015-10-05 DIAGNOSIS — I132 Hypertensive heart and chronic kidney disease with heart failure and with stage 5 chronic kidney disease, or end stage renal disease: Secondary | ICD-10-CM | POA: Diagnosis not present

## 2015-10-05 DIAGNOSIS — N184 Chronic kidney disease, stage 4 (severe): Secondary | ICD-10-CM | POA: Diagnosis not present

## 2015-10-05 DIAGNOSIS — Z9861 Coronary angioplasty status: Secondary | ICD-10-CM | POA: Diagnosis not present

## 2015-10-05 DIAGNOSIS — I252 Old myocardial infarction: Secondary | ICD-10-CM | POA: Insufficient documentation

## 2015-10-05 DIAGNOSIS — Z992 Dependence on renal dialysis: Secondary | ICD-10-CM | POA: Insufficient documentation

## 2015-10-05 DIAGNOSIS — J45909 Unspecified asthma, uncomplicated: Secondary | ICD-10-CM | POA: Diagnosis not present

## 2015-10-05 DIAGNOSIS — Z7902 Long term (current) use of antithrombotics/antiplatelets: Secondary | ICD-10-CM | POA: Diagnosis not present

## 2015-10-05 DIAGNOSIS — Z8674 Personal history of sudden cardiac arrest: Secondary | ICD-10-CM | POA: Insufficient documentation

## 2015-10-05 DIAGNOSIS — E785 Hyperlipidemia, unspecified: Secondary | ICD-10-CM | POA: Insufficient documentation

## 2015-10-05 DIAGNOSIS — Z79899 Other long term (current) drug therapy: Secondary | ICD-10-CM | POA: Insufficient documentation

## 2015-10-05 LAB — HEPATIC FUNCTION PANEL
ALBUMIN: 3.6 g/dL (ref 3.5–5.0)
ALK PHOS: 219 U/L — AB (ref 38–126)
ALT: 19 U/L (ref 17–63)
AST: 28 U/L (ref 15–41)
BILIRUBIN INDIRECT: 1.2 mg/dL — AB (ref 0.3–0.9)
Bilirubin, Direct: 0.7 mg/dL — ABNORMAL HIGH (ref 0.1–0.5)
TOTAL PROTEIN: 7.5 g/dL (ref 6.5–8.1)
Total Bilirubin: 1.9 mg/dL — ABNORMAL HIGH (ref 0.3–1.2)

## 2015-10-05 LAB — TSH: TSH: 0.513 u[IU]/mL (ref 0.350–4.500)

## 2015-10-05 MED ORDER — CARVEDILOL 6.25 MG PO TABS: 9.3750 mg | ORAL_TABLET | Freq: Two times a day (BID) | ORAL | Status: DC

## 2015-10-05 NOTE — Patient Instructions (Signed)
Routine lab work today. Will notify you of abnormal results, otherwise no news is good news!  INCREASE Coreg to 9.375 mg (1.5 tabs) twice daily.  Will refer you to EP at [facility] for device maintenance. Address: [address] ([place]), [place], [place] [address]  Phone: [phone]  Follow up 6 weeks.  Do the following things EVERYDAY: 1) Weigh yourself in the morning before breakfast. Write it down and keep it in a log. 2) Take your medicines as prescribed 3) Eat low salt foods-Limit salt (sodium) to 2000 mg per day.  4) Stay as active as you can everyday 5) Limit all fluids for the day to less than 2 liters

## 2015-10-07 NOTE — Progress Notes (Signed)
Patient ID: Tony Freeman, male   DOB: 1951/08/20, 64 y.o.   MRN: [id number]  Cardiology: Dr. Aundra Dubin PCP: Dr. Tillman Sers  HPI: Tony Freeman is a 64 year old man with history of atrial fibrillation, CAD (s/p multiple stents) chronic combined CHF (echo 04/02/2014 with LV EF: 25-30%, diffuse hypokinesis), ischemic cardiomyopathy s/p dual chamber Biotronik ICD placement 06/29/2014, DM2, ESRD, asthma, HLD, abnormal renal US 03/2014 (followed by urology).  He has a h/o PCI LAD/RCA in 2007, PCI LAD/RCA in 2009 at [facility], abnormal lexiscan stress test 06/2013 moderate reversible perfusion abnormality in anterior, septal and apical region, then PCI to LAD 08/2013. [facility] records indicate that coronary angio at that time also showed CTO of RCA and nonobstructive disease in the LCx. Echo at [facility] at least back to 01/2013 showed EF 35-40%, then <25% in 06/6758 with diastolic dysfunction and elevated RVSP. During his 03/2014 admission at [facility], he was diagnosed with new onset atrial fib-vs-flutter. Echo showed false tendon in LV apex, moderate LVH, EF 25-30%, diffuse HK, mod LAE, trivial TR but no mention of pulm HTN. He also had one troponin that was 0.11 and the rest were negative, felt due to CKD and intracranial etiology with some encephalopathy. He was discharged on Coumadin (along with his Plavix that he was on from prior PCI/stroke). However, it's not totally clear what he did with the Coumadin as it was no longer on his list when he saw a cardiologist in the [place] system at the end of June. The patient thinks his cardiologist took him off it but the note doesn't reflect that (the patient does have some cognitive difficulties since his stroke and lives with his son who manages his medicine). He went on to have ICD placement on 06/29/14. Reading their notes, it appears they were not aware of his 03/2014 diagnosis of atrial fibrillation until it was picked up on his ICD interrogation in early September at which  time he was placed on Edoxaban.   Admitted 07/16/14 with increased dyspnea. Found to be in atrial fibrillation with RVR. Had TEE/DC-CV. TEE showed EF 25%, RV mildly dilated and moderately to severely dysfunctional.  Successful cardioversion completed. He required IV lasix and short term milrinone. He transitioned to torsemide 40 mg twice a day. He remained on edoxaban. He was discharged on amiodarone 400 mg daily, carvedilol 6.25 mg twice a day, and hydralazine 50 mg tid/imdur 60 mg daily. Discharged weight was 204 pounds.   Cardiolite in 2/16 showed EF 31%, no ischemia.    He was admitted in 3/16 with acute bronchitis, hypercalcemia, and AKI with creatinine up to 5.5.   Bone marrow biopsy in 4/16 showed no significant abnormalities.     He seemed to do well until 7/16 when he had a cardiac [place] (apparently PEA, had CPR and epinephrine).  He was admitted to [facility].  Coronary angiography was done, and he had PTCA to proximal LAD in-stent restenosis.  The RCA had a chronic total occlusion.  He developed AKI and progressed to ESRD, now on HD.  He went to SNF initially after hospitalization, now is home with his son.    He is doing ok.  No tachypalpitations, remains in NSR today.  Mild dyspnea walking up steps.  Chronic 2 pillow orthopnea.  No chest pain. No melena/BRBPR, continues on warfarin.  Fatigue/dyspnea after walking 3/4-1 mile.    ECHO 03/2014 EF 25-30%  ECHO 8/16 EF 15%, mild LV dilation, mild-moderate MR, severely decreased RV systolic function,  PASP 63 mmHg.   Labs  07/21/14 TSH 0.413  10/15 K 3.5 => 4.3 Creatinine 3.86 => 3.96, Na 137  3/16 TSH normal 4/16 K 3.8, creatinine 4.62, HCT 28.8, AST/ALT normal 7/16 HCT 30 8/16 AST/ALT normal, TSH normal 10/16 HCT 32.5  ECG: NSR, IVCD 136 msec  ROS: All systems negative except as listed in HPI, PMH and Problem List.  Past Medical History  Diagnosis Date  . Systolic and diastolic CHF, chronic ([place])     a. Echo at  [facility] 11/17/2013 Ef <25%, severe hypokinesis of LV, moderately dilated LA, grade IV/IV diastolic dysfunction with irreversible restrictive pattern of mitral inflow, severe pulm HTN (RVSP >98mHg), 1-2+ MR  [facility] EF 25-30%, moderate concentric LVH, diffuse hypokinesis, moderately dilated LA, no significant MR observed  . CAD (coronary artery disease)     a. PCI LAD/RCA in 2007  b. [facility] PCI LAD 08/2013, CTO of RCA and nonobstructive disease in the LCx.  .Marland KitchenHyperlipidemia   . Dementia   . Ischemic cardiomyopathy     a. s/p Biotronik ICD 06/2014.  .Marland KitchenRetinopathy     bilateral  . HTN (hypertension)   . Abnormal renal ultrasound     a. 03/2014: Abnormal renal ultrasound with left renal lesion and bladder wall thickening and microscopic hematuria. Multiple cysts on prior MRI. Given inability to use contrast, f/u imaging limited, repeat [place] with outpatient uro f/u.  .Marland KitchenParoxysmal atrial fibrillation (HCC)   . Paroxysmal atrial flutter ([place]   . Anemia   . Myocardial infarction ([facility] July 2016    Heart attack  . Peripheral vascular disease ([place]   . Stroke ([facility]     right arm weakness  . Asthma   . CKD (chronic kidney disease), stage IV ([place]     on dialysis T/TH/Sa  . Diabetes mellitus with nephropathy ([place]     type 2  . H/O cardiac arrest 04/2015  . Constipation     Current Outpatient Prescriptions  Medication Sig Dispense Refill  . acetaminophen (TYLENOL) 325 MG tablet Take two tablets by mouth every 8 hours as needed for pain    . albuterol (PROVENTIL HFA;VENTOLIN HFA) 108 (90 BASE) MCG/ACT inhaler Inhale 2 puffs into the lungs every 6 (six) hours as needed for wheezing or shortness of breath.    .Marland Kitchenalbuterol (PROVENTIL) (2.5 MG/3ML) 0.083% nebulizer solution Take 2.5 mg by nebulization every 6 (six) hours as needed for wheezing or shortness of breath.    .Marland Kitchenamiodarone (PACERONE) 100 MG tablet Take one tablet by mouth once daily for  AFIB    . atorvastatin (LIPITOR) 40 MG tablet Take 1 tablet (40 mg total) by mouth daily. (Patient taking differently: Take 40 mg by mouth daily. cholesterol) 30 tablet 0  . B Complex-C-Folic Acid (NEPHRO-VITE PO) Take 1 tablet by mouth daily.    . carvedilol (COREG) 6.25 MG tablet Take 1.5 tablets (9.375 mg total) by mouth 2 (two) times daily with a meal. 45 tablet 6  . clopidogrel (PLAVIX) 75 MG tablet Take 1 tablet (75 mg total) by mouth daily. 30 tablet 3  . docusate sodium (COLACE) 100 MG capsule Take 100 mg by mouth daily.     . insulin lispro (HUMALOG) 100 UNIT/ML injection Inject 10 Units into the skin 3 (three) times daily before meals. For BS > 140    . lisinopril (PRINIVIL,ZESTRIL) 5 MG tablet Take 1 tablet (5 mg  total) by mouth daily. 30 tablet 6  . loratadine (CLARITIN) 10 MG tablet Take one tablet by mouth once daily for allergies    . Multiple Vitamin (MULTIVITAMIN) tablet Take 1 tablet by mouth daily.    Marland Kitchen oxyCODONE (ROXICODONE) 5 MG immediate release tablet Take 1 tablet (5 mg total) by mouth every 6 (six) hours as needed for severe pain. Take one tablet by mouth every 8 hours as needed for pain 30 tablet 0  . warfarin (COUMADIN) 5 MG tablet Take 1 tablet (5 mg total) by mouth as directed. 45 tablet 3  . nitroGLYCERIN (NITROSTAT) 0.4 MG SL tablet Place 1 tablet (0.4 mg total) under the tongue every 5 (five) minutes as needed for chest pain. (Patient not taking: Reported on 10/05/2015) 30 tablet 1   No current facility-administered medications for this encounter.   PHYSICAL EXAM: Filed Vitals:   10/05/15 1451  BP: 102/66  Pulse: 73  Weight: 179 lb 12.8 oz (81.557 kg)  SpO2: 96%   General:  NAD HEENT: normal Neck: supple. JVP 10-12 cm. Carotids 2+ bilaterally; no bruits. No lymphadenopathy or thryomegaly appreciated. Cor: PMI normal. Regular rate & rhythm. No rubs, gallops. 3/6 HSM at apex.   Lungs: clear Abdomen: soft, nontender, nondistended. No hepatosplenomegaly. No  bruits or masses. Good bowel sounds. Extremities: no cyanosis, clubbing, rash.  Trace ankle edema.   Neuro: alert & orientedx3, cranial nerves grossly intact. Moves all 4 extremities w/o difficulty. Affect pleasant.  ASSESSMENT & PLAN: 1. Chronic systolic HF: Ischemic cardiomyopathy. Has Biotronik ICD. ECHO 8/16 with EF 15%, severe RV systolic function. NYHA class II-III symptoms.  He still looks volume overloaded on exam.  Now ESRD and getting HD.  - If possible, would try to pull more fluid off at HD.    - Increase Coreg to 9.375 mg bid.  - Continue lisinopril 5 mg daily.   - Needs followup in EP clinic for ICD, will arrange.  2. Atrial fibrillation: Paroxysmal.  In 9/15, had TEE with no LAA thrombus and successful DC-CV.  He is in NSR today.  He continues on coumadin. - Continue amiodarone 100 mg daily, check LFTs and TSH today.  He will need regular eye exams on amiodarone. - Continue warfarin, aim for INR 2-2.5 while on Plavix.  3. CAD: Cardiac arrest 7/16 with cath and cutting balloon angioplasty to proximal LAD in-stent restenosis.  Will continue Plavix/warfarin for 1 year as long as he tolerates then likely stop Plavix in 7/17.  4. ESRD: As above, hopefully can remove more fluid at HD.   Followup in 6 wks at CHF clinic.   Tony Delmonaco,MD 4:53 PM  10/07/2015

## 2015-10-08 ENCOUNTER — Ambulatory Visit (HOSPITAL_COMMUNITY): Payer: Medicare HMO

## 2015-10-08 ENCOUNTER — Ambulatory Visit (INDEPENDENT_AMBULATORY_CARE_PROVIDER_SITE_OTHER): Payer: Medicare HMO

## 2015-10-08 ENCOUNTER — Encounter (HOSPITAL_COMMUNITY)
Admission: RE | Admit: 2015-10-08 | Discharge: 2015-10-08 | Disposition: A | Discharge status: Home / Self Care | Payer: Medicare HMO | Source: Ambulatory Visit | Attending: Cardiology | Admitting: Cardiology

## 2015-10-08 DIAGNOSIS — Z9861 Coronary angioplasty status: Secondary | ICD-10-CM | POA: Diagnosis not present

## 2015-10-08 DIAGNOSIS — Z5181 Encounter for therapeutic drug level monitoring: Secondary | ICD-10-CM

## 2015-10-08 DIAGNOSIS — I4891 Unspecified atrial fibrillation: Secondary | ICD-10-CM | POA: Diagnosis not present

## 2015-10-08 LAB — GLUCOSE, CAPILLARY
Glucose-Capillary: 129 mg/dL — ABNORMAL HIGH (ref 65–99)
Glucose-Capillary: 164 mg/dL — ABNORMAL HIGH (ref 65–99)

## 2015-10-08 LAB — POCT INR: INR: 2.8

## 2015-10-10 ENCOUNTER — Ambulatory Visit (HOSPITAL_COMMUNITY): Payer: Medicare HMO

## 2015-10-10 ENCOUNTER — Encounter (HOSPITAL_COMMUNITY)
Admission: RE | Admit: 2015-10-10 | Discharge: 2015-10-10 | Disposition: A | Discharge status: Home / Self Care | Payer: Medicare HMO | Source: Ambulatory Visit | Attending: Cardiology | Admitting: Cardiology

## 2015-10-10 DIAGNOSIS — Z9861 Coronary angioplasty status: Secondary | ICD-10-CM | POA: Diagnosis not present

## 2015-10-10 LAB — GLUCOSE, CAPILLARY: Glucose-Capillary: 182 mg/dL — ABNORMAL HIGH (ref 65–99)

## 2015-10-12 ENCOUNTER — Ambulatory Visit (HOSPITAL_COMMUNITY): Payer: Medicare HMO

## 2015-10-12 ENCOUNTER — Encounter (HOSPITAL_COMMUNITY)
Admission: RE | Admit: 2015-10-12 | Discharge: 2015-10-12 | Disposition: A | Discharge status: Home / Self Care | Payer: Medicare HMO | Source: Ambulatory Visit | Attending: Cardiology | Admitting: Cardiology

## 2015-10-12 DIAGNOSIS — Z9861 Coronary angioplasty status: Secondary | ICD-10-CM | POA: Diagnosis not present

## 2015-10-12 LAB — GLUCOSE, CAPILLARY: GLUCOSE-CAPILLARY: 116 mg/dL — AB (ref 65–99)

## 2015-10-15 ENCOUNTER — Telehealth (HOSPITAL_COMMUNITY): Payer: Self-pay | Admitting: *Deleted

## 2015-10-15 ENCOUNTER — Ambulatory Visit (HOSPITAL_COMMUNITY): Payer: Medicare HMO

## 2015-10-15 ENCOUNTER — Encounter (HOSPITAL_COMMUNITY)
Admission: RE | Admit: 2015-10-15 | Discharge: 2015-10-15 | Disposition: A | Discharge status: Home / Self Care | Payer: Medicare HMO | Source: Ambulatory Visit | Attending: Cardiology | Admitting: Cardiology

## 2015-10-15 DIAGNOSIS — R748 Abnormal levels of other serum enzymes: Secondary | ICD-10-CM

## 2015-10-15 DIAGNOSIS — Z9861 Coronary angioplasty status: Secondary | ICD-10-CM | POA: Diagnosis not present

## 2015-10-15 LAB — GLUCOSE, CAPILLARY: Glucose-Capillary: 161 mg/dL — ABNORMAL HIGH (ref 65–99)

## 2015-10-15 NOTE — Telephone Encounter (Signed)
U/s sch for Fri 1/6 at 8 am, 7:45 arrival npo after mn, pt's son aware and agreeable.  There were sooner appt however per son pt has to have 8 am appt.

## 2015-10-15 NOTE — Telephone Encounter (Signed)
-----   Message from Larey Dresser, MD sent at 10/05/2015  4:39 PM EST ----- Alkaline phosphatase and total bilirubin higher, would get abdominal US to look at liver/gallbladder

## 2015-10-15 NOTE — Telephone Encounter (Signed)
Notes Recorded by Scarlette Calico, RN on 10/15/2015 at 4:33 PM Pt's son aware and agreeable, will place order and sch u/s

## 2015-10-17 ENCOUNTER — Encounter (HOSPITAL_COMMUNITY)
Admission: RE | Admit: 2015-10-17 | Discharge: 2015-10-17 | Disposition: A | Discharge status: Home / Self Care | Payer: Medicare HMO | Source: Ambulatory Visit | Attending: Cardiology | Admitting: Cardiology

## 2015-10-17 ENCOUNTER — Ambulatory Visit (HOSPITAL_COMMUNITY): Payer: Medicare HMO

## 2015-10-17 DIAGNOSIS — Z9861 Coronary angioplasty status: Secondary | ICD-10-CM | POA: Diagnosis not present

## 2015-10-17 LAB — GLUCOSE, CAPILLARY: Glucose-Capillary: 165 mg/dL — ABNORMAL HIGH (ref 65–99)

## 2015-10-18 ENCOUNTER — Institutional Professional Consult (permissible substitution): Payer: Medicare HMO | Admitting: Cardiology

## 2015-10-19 ENCOUNTER — Ambulatory Visit (HOSPITAL_COMMUNITY): Payer: Medicare HMO

## 2015-10-19 ENCOUNTER — Encounter (HOSPITAL_COMMUNITY)
Admission: RE | Admit: 2015-10-19 | Discharge: 2015-10-19 | Disposition: A | Discharge status: Home / Self Care | Payer: Medicare HMO | Source: Ambulatory Visit | Attending: Cardiology | Admitting: Cardiology

## 2015-10-19 DIAGNOSIS — Z9861 Coronary angioplasty status: Secondary | ICD-10-CM | POA: Diagnosis not present

## 2015-10-19 LAB — GLUCOSE, CAPILLARY
Glucose-Capillary: 96 mg/dL (ref 65–99)
Glucose-Capillary: 95 mg/dL (ref 65–99)

## 2015-10-19 NOTE — Progress Notes (Signed)
Pt in for exercise. Pt pre exercise blood glucose 97.  Questioned pt on dietary intake today.  Pt stated he was able to keep some soup down this morning.  Asked pt what he meant.  Pt explained that when he got home on Wednesday from exercise he started throwing up.  Pt spent the day in bed on Thursday due to vomiting and diarrhea.  Pt did not go to dialysis because he felt so bad.  Pt not allowed to exercise. Pt to go to dialysis on tomorrow.  Advised pt to divulge to the dialysis center his symptoms that resulted in him not going to dialysis on Thursday.  Pt given gingerale and peanut butter crackers.  Pt escorted to main entrance for SCAT pickup. Reminded pt of the sick policy to be symptom free for 48 hours prior to returning to rehab.  Pt verbalized understanding. Cherre Huger, BSN

## 2015-10-22 ENCOUNTER — Ambulatory Visit (HOSPITAL_COMMUNITY): Payer: Medicare HMO

## 2015-10-22 ENCOUNTER — Encounter: Payer: Self-pay | Admitting: Pharmacist

## 2015-10-23 ENCOUNTER — Encounter: Payer: Self-pay | Admitting: Cardiology

## 2015-10-23 ENCOUNTER — Ambulatory Visit (INDEPENDENT_AMBULATORY_CARE_PROVIDER_SITE_OTHER): Payer: Medicare HMO | Admitting: Cardiology

## 2015-10-23 VITALS — BP 110/80 | HR 77 | Ht 66.0 in | Wt 182.0 lb

## 2015-10-23 DIAGNOSIS — I5042 Chronic combined systolic (congestive) and diastolic (congestive) heart failure: Secondary | ICD-10-CM

## 2015-10-23 DIAGNOSIS — I255 Ischemic cardiomyopathy: Secondary | ICD-10-CM | POA: Diagnosis not present

## 2015-10-23 LAB — CUP PACEART INCLINIC DEVICE CHECK
Brady Statistic RA Percent Paced: 49 %
Date Time Interrogation Session: 20161227103409
HighPow Impedance: 53 Ohm
Lead Channel Impedance Value: 463 Ohm
Lead Channel Pacing Threshold Amplitude: 0.7 V
Lead Channel Pacing Threshold Pulse Width: 0.4 ms
Lead Channel Sensing Intrinsic Amplitude: 3 mV
Lead Channel Sensing Intrinsic Amplitude: 8.9 mV
Lead Channel Setting Pacing Amplitude: 2.5 V
Lead Channel Setting Sensing Sensitivity: 0.8 mV
MDC IDC MSMT BATTERY VOLTAGE: 3.05 V
MDC IDC MSMT LEADCHNL RA IMPEDANCE VALUE: 535 Ohm
MDC IDC MSMT LEADCHNL RA PACING THRESHOLD AMPLITUDE: 1.2 V
MDC IDC MSMT LEADCHNL RA PACING THRESHOLD PULSEWIDTH: 0.4 ms
MDC IDC SET LEADCHNL RV PACING AMPLITUDE: 1.7 V
MDC IDC SET LEADCHNL RV PACING PULSEWIDTH: 0.4 ms
MDC IDC STAT BRADY RV PERCENT PACED: 1 %
Pulse Gen Model: 392412
Pulse Gen Serial Number: 60821694

## 2015-10-23 NOTE — Patient Instructions (Signed)
Medication Instructions:  Your physician recommends that you continue on your current medications as directed. Please refer to the Current Medication list given to you today.  Labwork: None ordered   Testing/Procedures: None ordered  Follow-Up: Remote monitoring is used to monitor your Pacemaker of ICD from home. This monitoring reduces the number of office visits required to check your device to one time per year. It allows [place] to keep an eye on the functioning of your device to ensure it is working properly. You are scheduled for a device check from home on 01/22/2016. You may send your transmission at any time that day. If you have a wireless device, the transmission will be sent automatically. After your physician reviews your transmission, you will receive a postcard with your next transmission date.  Your physician wants you to follow-up in: 1 year with Dr. Curt Bears.  You will receive a reminder letter in the mail two months in advance. If you don't receive a letter, please call our office to schedule the follow-up appointment.  If you need a refill on your cardiac medications before your next appointment, please call your pharmacy.  Thank you for choosing [facility]!!   Trinidad Curet, RN [phone]

## 2015-10-23 NOTE — Progress Notes (Signed)
Electrophysiology Office Note   Date:  10/23/2015   ID:  Asher, Rajaram [birth year], MRN [id number]  PCP:  Tanya Nones, MD  Cardiologist:  [name] Primary Electrophysiologist:  Will Meredith Leeds, MD    Chief Complaint  Patient presents with  . Pacemaker Check     History of Present Illness: Tony Freeman is a 64 y.o. male who presents today for electrophysiology evaluation.   He has a history of atrial fibrillation, coronary artery disease status post multiple stents, chronic combined CHF, ischemic cardiomyopathy status post Biotronik dual-chamber ICD, type 2 diabetes, ESRD, asthma, hyperlipidemia.  He presents today to establish care.  He has a history of PCI to the LAD/RCA in 2007, PCI to the LAD/RCA in 2009 at [facility], PCI to the LAD in 08/2013. At that time he was found to have CT of the RCA and nonobstructive disease in the circumflex. In 2015 he was diagnosed with new onset atrial fibrillation versus flutter. Echo at the time demonstrated an EF of 25-30%. He underwent ICD placement 06/29/14. He was admitted 07/16/14 with atrial fibrillation with RVR. He cardioversion at that time. His RV on TEE was found to be mildly dilated and moderately to severely dysfunctional. His discharge later that time was 204 pounds. On 05/16/2015 he had a cardiac arrest at her [employer]. He was admitted to [facility] coronary artery angiography showed LAD in-stent restenosis. He had PTCA to that artery. He developed a KI which progressed ESRD, he is now on dialysis.  Today, he denies symptoms of palpitations, chest pain, orthopnea, PND, lower extremity edema, claudication, dizziness, presyncope, syncope, bleeding, or neurologic sequela. The patient is tolerating medications without difficulties and is otherwise without complaint today.    Past Medical History  Diagnosis Date  . Systolic and diastolic CHF, chronic ([place])     a. Echo at [facility] 11/17/2013 Ef <25%,  severe hypokinesis of LV, moderately dilated LA, grade IV/IV diastolic dysfunction with irreversible restrictive pattern of mitral inflow, severe pulm HTN (RVSP >50mmHg), 1-2+ MR  b. Echo at [facility] 04/02/14 EF 25-30%, moderate concentric LVH, diffuse hypokinesis, moderately dilated LA, no significant MR observed  . CAD (coronary artery disease)     a. PCI LAD/RCA in 2007  b. PCI LAD/RCA in 2009 at [facility]  c. PCI LAD 08/2013, CTO of RCA and nonobstructive disease in the LCx.  Marland Kitchen Hyperlipidemia   . Dementia   . Ischemic cardiomyopathy     a. s/p Biotronik ICD 06/2014.  Marland Kitchen Retinopathy     bilateral  . HTN (hypertension)   . Abnormal renal ultrasound     a. 03/2014: Abnormal renal ultrasound with left renal lesion and bladder wall thickening and microscopic hematuria. Multiple cysts on prior MRI. Given inability to use contrast, f/u imaging limited, repeat US 3 months with outpatient uro f/u.  Marland Kitchen Paroxysmal atrial fibrillation (HCC)   . Paroxysmal atrial flutter ([place])   . Anemia   . Myocardial infarction [facility]) July 2016    Heart attack  . Peripheral vascular disease ([place])   . Stroke [facility])     right arm weakness  . Asthma   . CKD (chronic kidney disease), stage IV ([place])     on dialysis T/TH/Sa  . Diabetes mellitus with nephropathy ([place])     type 2  . H/O cardiac arrest 04/2015  . Constipation    Past Surgical History  Procedure Laterality Date  . Coronary stent placement  Multivessel coronary intervention in 2007 and 2009. Ischemic cardiomyopathyPCI LAD/ RCA in 2007 by Dr. Dwyane Dee. PCI LAD/RCA in 2009 at [facility]; PCI LAD 08/2013. Presented with unstable angina pectoris.. Coronary angio demonstrated proximal and distal LAD lesions.62/20 PROMUS stent in proximal LAD// PTCA of distal LAD  . Cardiac defibrillator placement    . Spine surgery      anterior fusion  . Hernia repair    . Tee without cardioversion N/A 07/26/2014    Procedure: TRANSESOPHAGEAL ECHOCARDIOGRAM (TEE);   Surgeon: Larey Dresser, MD;  Location: [place];  Service: Cardiovascular;  Laterality: N/A;  . Cardioversion N/A 07/26/2014    Procedure: CARDIOVERSION;  Surgeon: Larey Dresser, MD;  Location: [place];  Service: Cardiovascular;  Laterality: N/A;  . Right heart catheterization N/A 07/25/2014    Procedure: RIGHT HEART CATH;  Surgeon: Jolaine Artist, MD;  Location[facility] C[facility];  Service: Cardiovascular;  Laterality: N/A;  . Cardiac defibrillator placement  06/2014  . Insertion of dialysis catheter    . Eye surgery Bilateral     for burst blood vessel  . Cardiac catheterization  04/2015  . Coronary angioplasty    . Colonoscopy w/ polypectomy    . Finger fracture surgery Right     little finger  . Av fistula placement Left 08/03/2015    Procedure: ARTERIOVENOUS (AV) FISTULA CREATION VERSUS BASILIC VEIN TRANSPOSITION VERSUS ARTERIOVENOUS GRAFT INSERTION;  Surgeon: Angelia Mould, MD;  Location: [place];  Service: Vascular;  Laterality: Left;     Current Outpatient Prescriptions  Medication Sig Dispense Refill  . acetaminophen (TYLENOL) 325 MG tablet Take two tablets by mouth every 8 hours as needed for pain    . albuterol (PROVENTIL HFA;VENTOLIN HFA) 108 (90 BASE) MCG/ACT inhaler Inhale 2 puffs into the lungs every 6 (six) hours as needed for wheezing or shortness of breath.    Marland Kitchen albuterol (PROVENTIL) (2.5 MG/3ML) 0.083% nebulizer solution Take 2.5 mg by nebulization every 6 (six) hours as needed for wheezing or shortness of breath.    Marland Kitchen amiodarone (PACERONE) 100 MG tablet Take one tablet by mouth once daily for AFIB    . atorvastatin (LIPITOR) 40 MG tablet Take 40 mg by mouth daily.    . B Complex-C-Folic Acid (NEPHRO-VITE PO) Take 1 tablet by mouth daily.    . carvedilol (COREG) 6.25 MG tablet Take 1.5 tablets (9.375 mg total) by mouth 2 (two) times daily with a meal. 45 tablet 6  . clopidogrel (PLAVIX) 75 MG tablet Take 1 tablet (75 mg total) by mouth daily. 30 tablet 3  .  docusate sodium (COLACE) 100 MG capsule Take 100 mg by mouth daily.     . insulin lispro (HUMALOG) 100 UNIT/ML injection Inject 10 Units into the skin 3 (three) times daily before meals. For BS > 140    . lisinopril (PRINIVIL,ZESTRIL) 5 MG tablet Take 1 tablet (5 mg total) by mouth daily. 30 tablet 6  . loratadine (CLARITIN) 10 MG tablet Take one tablet by mouth once daily for allergies    . Multiple Vitamin (MULTIVITAMIN) tablet Take 1 tablet by mouth daily.    . nitroGLYCERIN (NITROSTAT) 0.4 MG SL tablet Place 1 tablet (0.4 mg total) under the tongue every 5 (five) minutes as needed for chest pain. 30 tablet 1  . warfarin (COUMADIN) 5 MG tablet Take 1 tablet (5 mg total) by mouth as directed. 45 tablet 3   No current facility-administered medications for this visit.    Allergies:  Review of patient's allergies indicates no known allergies.   Social History:  The patient  reports that he has never smoked. He has never used smokeless tobacco. He reports that he does not drink alcohol or use illicit drugs.   Family History:  The patient's family history includes Cancer in his father; Diabetes in his brother, mother, and sister; Heart attack in his brother; Heart disease in his mother; Hyperlipidemia in his brother and sister; Hypertension in his brother and sister.    ROS:  Please see the history of present illness.   Otherwise, review of systems is positive for DOE, wheezing.   All other systems are reviewed and negative.    PHYSICAL EXAM: VS:  BP 110/80 mmHg  Pulse 77  Ht 5\' 6"  (1.676 m)  Wt 182 lb (82.555 kg)  BMI 29.39 kg/m2 , BMI Body mass index is 29.39 kg/(m^2). GEN: Well nourished, well developed, in no acute distress HEENT: normal Neck: no JVD, carotid bruits, or masses Cardiac: RRR; no murmurs, rubs, or gallops,no edema  Respiratory:  clear to auscultation bilaterally, normal work of breathing GI: soft, nontender, nondistended, + BS MS: no deformity or atrophy Skin: warm  and dry, device pocket is well healed Neuro:  Strength and sensation are intact Psych: euthymic mood, full affect  EKG:  EKG is ordered today. The ekg ordered today shows sinus rhythm, RAD, prolonged QTc  Device interrogation is reviewed today in detail.  See PaceArt for details.   Recent Labs: 05/24/2015: BUN 44*; Creatinine 5.9* 07/30/2015: Platelets 131* 08/03/2015: Hemoglobin 11.9*; Potassium 4.1; Sodium 139 10/05/2015: ALT 19; TSH 0.513    Lipid Panel     Component Value Date/Time   CHOL 77 07/17/2014 0350   TRIG 51 07/17/2014 0350   HDL 27* 07/17/2014 0350   CHOLHDL 2.9 07/17/2014 0350   VLDL 10 07/17/2014 0350   LDLCALC 40 07/17/2014 0350     Wt Readings from Last 3 Encounters:  10/23/15 182 lb (82.555 kg)  10/05/15 179 lb 12.8 oz (81.557 kg)  09/26/15 178 lb (80.74 kg)      Other studies Reviewed: Additional studies/ records that were reviewed today include: TTE 06/25/15 Review of the above records today demonstrates:  - Severe global reduction in LV function; restrictive filling; severe LAE; mild RAE; severely reduced RV function; mild tomoderate MR; mild TR; severely elevated pulmonary pressure.  EF 15%   ASSESSMENT AND PLAN:  1.  Chronic congestive heart failure: Currently on carvedilol and lisinopril. He also has a Biotronik ICD which is working appropriately. He is a dialysis patient and they were instructed to pull more fluid off a dialysis as he was volume overloaded and his last clinic visit.  2. Paroxysmal Atrial fibrillation: Feeling well today and has not noticed any palpitations. He is currently on amiodarone 100 mg. He recently had his LFTs and TSH checked which were normal. He is anticoagulated with warfarin. He has a CHADS2VASC score of 6.  This patients CHA2DS2-VASc Score and unadjusted Ischemic Stroke Rate (% per year) is equal to 9.7 % stroke rate/year from a score of 6  Above score calculated as 1 point each if present [CHF, HTN, DM,  Vascular=MI/PAD/Aortic Plaque, Age if 65-74, or Male] Above score calculated as 2 points each if present [Age > 75, or Stroke/TIA/TE]  3. Coronary artery disease: No recent chest pain. Continue current management with Plavix.  Current medicines are reviewed at length with the patient today.   The patient does not have concerns regarding  his medicines.  The following changes were made today:  none  Labs/ tests ordered today include:  No orders of the defined types were placed in this encounter.   Disposition:   FU with Will Camnitz 1 year  Signed, Will Meredith Leeds, MD  10/23/2015 9:07 AM     [facility] [address] [place] [address] [phone] (office) [phone] (fax)

## 2015-10-24 ENCOUNTER — Ambulatory Visit (HOSPITAL_COMMUNITY): Payer: Medicare HMO

## 2015-10-24 ENCOUNTER — Encounter (HOSPITAL_COMMUNITY)
Admission: RE | Admit: 2015-10-24 | Discharge: 2015-10-24 | Disposition: A | Discharge status: Home / Self Care | Payer: Medicare HMO | Source: Ambulatory Visit | Attending: Cardiology | Admitting: Cardiology

## 2015-10-24 DIAGNOSIS — Z9861 Coronary angioplasty status: Secondary | ICD-10-CM | POA: Diagnosis not present

## 2015-10-24 LAB — GLUCOSE, CAPILLARY: Glucose-Capillary: 187 mg/dL — ABNORMAL HIGH (ref 65–99)

## 2015-10-26 ENCOUNTER — Ambulatory Visit (HOSPITAL_COMMUNITY): Payer: Medicare HMO

## 2015-10-26 ENCOUNTER — Encounter (HOSPITAL_COMMUNITY)
Admission: RE | Admit: 2015-10-26 | Discharge: 2015-10-26 | Disposition: A | Discharge status: Home / Self Care | Payer: Medicare HMO | Source: Ambulatory Visit | Attending: Cardiology | Admitting: Cardiology

## 2015-10-26 DIAGNOSIS — Z9861 Coronary angioplasty status: Secondary | ICD-10-CM | POA: Diagnosis not present

## 2015-10-26 LAB — PROTIME-INR: INR: 4.6 — AB (ref 0.9–1.1)

## 2015-10-26 LAB — GLUCOSE, CAPILLARY: Glucose-Capillary: 141 mg/dL — ABNORMAL HIGH (ref 65–99)

## 2015-10-27 ENCOUNTER — Telehealth: Payer: Self-pay | Admitting: Physician Assistant

## 2015-10-27 NOTE — Telephone Encounter (Signed)
    [facility] called on call pager to notify us that patient's INR is 4.56. He has an apt with our CVD coumadin clinic on 10/31/15. I have instructed him to hold his coumadin over the weekend and monday. I will sent a staff message to our schedulers and coumadin clinic to call him on Tuesday (closed Monday for holiday) and have him be seen.    Angelena Form PA-C  [facility]

## 2015-10-29 ENCOUNTER — Encounter (HOSPITAL_COMMUNITY): Payer: Medicare HMO

## 2015-10-29 ENCOUNTER — Ambulatory Visit (HOSPITAL_COMMUNITY): Payer: Medicare HMO

## 2015-10-29 DIAGNOSIS — Z9861 Coronary angioplasty status: Secondary | ICD-10-CM | POA: Insufficient documentation

## 2015-10-31 ENCOUNTER — Ambulatory Visit (HOSPITAL_COMMUNITY): Payer: Medicare HMO

## 2015-10-31 ENCOUNTER — Ambulatory Visit (HOSPITAL_COMMUNITY)
Admission: RE | Admit: 2015-10-31 | Discharge: 2015-10-31 | Disposition: A | Discharge status: Home / Self Care | Payer: Medicare HMO | Source: Ambulatory Visit | Attending: Vascular Surgery | Admitting: Vascular Surgery

## 2015-10-31 ENCOUNTER — Ambulatory Visit (INDEPENDENT_AMBULATORY_CARE_PROVIDER_SITE_OTHER): Payer: Medicare HMO | Admitting: *Deleted

## 2015-10-31 ENCOUNTER — Encounter (HOSPITAL_COMMUNITY)
Admission: RE | Admit: 2015-10-31 | Discharge: 2015-10-31 | Disposition: A | Discharge status: Home / Self Care | Payer: Medicare HMO | Source: Ambulatory Visit | Attending: Cardiology | Admitting: Cardiology

## 2015-10-31 ENCOUNTER — Other Ambulatory Visit: Payer: Self-pay | Admitting: Vascular Surgery

## 2015-10-31 DIAGNOSIS — Z5181 Encounter for therapeutic drug level monitoring: Secondary | ICD-10-CM | POA: Diagnosis not present

## 2015-10-31 DIAGNOSIS — I129 Hypertensive chronic kidney disease with stage 1 through stage 4 chronic kidney disease, or unspecified chronic kidney disease: Secondary | ICD-10-CM | POA: Diagnosis not present

## 2015-10-31 DIAGNOSIS — N184 Chronic kidney disease, stage 4 (severe): Secondary | ICD-10-CM

## 2015-10-31 DIAGNOSIS — E785 Hyperlipidemia, unspecified: Secondary | ICD-10-CM | POA: Insufficient documentation

## 2015-10-31 DIAGNOSIS — E1122 Type 2 diabetes mellitus with diabetic chronic kidney disease: Secondary | ICD-10-CM | POA: Insufficient documentation

## 2015-10-31 DIAGNOSIS — Z4931 Encounter for adequacy testing for hemodialysis: Secondary | ICD-10-CM

## 2015-10-31 DIAGNOSIS — I4891 Unspecified atrial fibrillation: Secondary | ICD-10-CM

## 2015-10-31 DIAGNOSIS — Z9861 Coronary angioplasty status: Secondary | ICD-10-CM | POA: Diagnosis not present

## 2015-10-31 LAB — POCT INR: INR: 1.5

## 2015-11-01 LAB — GLUCOSE, CAPILLARY: Glucose-Capillary: 156 mg/dL — ABNORMAL HIGH (ref 65–99)

## 2015-11-02 ENCOUNTER — Ambulatory Visit (HOSPITAL_COMMUNITY)
Admission: RE | Admit: 2015-11-02 | Discharge: 2015-11-02 | Disposition: A | Discharge status: Home / Self Care | Payer: Medicare HMO | Source: Ambulatory Visit | Attending: Cardiology | Admitting: Cardiology

## 2015-11-02 ENCOUNTER — Encounter (HOSPITAL_COMMUNITY)
Admission: RE | Admit: 2015-11-02 | Discharge: 2015-11-02 | Disposition: A | Discharge status: Home / Self Care | Payer: Medicare HMO | Source: Ambulatory Visit | Attending: Cardiology | Admitting: Cardiology

## 2015-11-02 ENCOUNTER — Encounter: Payer: Self-pay | Admitting: Vascular Surgery

## 2015-11-02 ENCOUNTER — Ambulatory Visit (HOSPITAL_COMMUNITY): Payer: Medicare HMO

## 2015-11-02 DIAGNOSIS — K8020 Calculus of gallbladder without cholecystitis without obstruction: Secondary | ICD-10-CM | POA: Insufficient documentation

## 2015-11-02 DIAGNOSIS — N281 Cyst of kidney, acquired: Secondary | ICD-10-CM | POA: Insufficient documentation

## 2015-11-02 DIAGNOSIS — Z9861 Coronary angioplasty status: Secondary | ICD-10-CM | POA: Diagnosis not present

## 2015-11-02 DIAGNOSIS — R748 Abnormal levels of other serum enzymes: Secondary | ICD-10-CM | POA: Diagnosis present

## 2015-11-02 DIAGNOSIS — I129 Hypertensive chronic kidney disease with stage 1 through stage 4 chronic kidney disease, or unspecified chronic kidney disease: Secondary | ICD-10-CM | POA: Diagnosis not present

## 2015-11-02 DIAGNOSIS — E119 Type 2 diabetes mellitus without complications: Secondary | ICD-10-CM | POA: Diagnosis not present

## 2015-11-02 DIAGNOSIS — N189 Chronic kidney disease, unspecified: Secondary | ICD-10-CM | POA: Diagnosis not present

## 2015-11-02 DIAGNOSIS — Z992 Dependence on renal dialysis: Secondary | ICD-10-CM | POA: Diagnosis not present

## 2015-11-05 ENCOUNTER — Encounter (HOSPITAL_COMMUNITY): Payer: Medicare HMO

## 2015-11-05 ENCOUNTER — Ambulatory Visit (HOSPITAL_COMMUNITY): Payer: Medicare HMO

## 2015-11-05 LAB — GLUCOSE, CAPILLARY: GLUCOSE-CAPILLARY: 150 mg/dL — AB (ref 65–99)

## 2015-11-07 ENCOUNTER — Encounter (HOSPITAL_COMMUNITY)
Admission: RE | Admit: 2015-11-07 | Discharge: 2015-11-07 | Disposition: A | Discharge status: Home / Self Care | Payer: Medicare HMO | Source: Ambulatory Visit | Attending: Cardiology | Admitting: Cardiology

## 2015-11-07 ENCOUNTER — Ambulatory Visit (HOSPITAL_COMMUNITY): Payer: Medicare HMO

## 2015-11-07 ENCOUNTER — Ambulatory Visit (INDEPENDENT_AMBULATORY_CARE_PROVIDER_SITE_OTHER): Payer: Medicare HMO | Admitting: Vascular Surgery

## 2015-11-07 ENCOUNTER — Encounter: Payer: Self-pay | Admitting: Vascular Surgery

## 2015-11-07 VITALS — BP 105/63 | HR 69 | Temp 97.0°F | Ht 66.0 in | Wt 177.3 lb

## 2015-11-07 DIAGNOSIS — Z9861 Coronary angioplasty status: Secondary | ICD-10-CM | POA: Diagnosis not present

## 2015-11-07 DIAGNOSIS — N184 Chronic kidney disease, stage 4 (severe): Secondary | ICD-10-CM

## 2015-11-07 LAB — GLUCOSE, CAPILLARY: GLUCOSE-CAPILLARY: 124 mg/dL — AB (ref 65–99)

## 2015-11-07 NOTE — Progress Notes (Signed)
Patient name: Tony Freeman MRN: [id number] DOB: [birth year] Sex: male  REASON FOR VISIT: Follow up after left brachiocephalic AV fistula  HPI: Tony Freeman is a 65 y.o. male who presented for new access and underwent a left brachiocephalic AV fistula on 123456. He returns for a routine follow up visit. He dialyzes on Tuesdays Thursdays and Saturdays. He has a functioning right IJ tunneled dialysis catheter. He denies any uremic symptoms.  Current Outpatient Prescriptions  Medication Sig Dispense Refill  . acetaminophen (TYLENOL) 325 MG tablet Take two tablets by mouth every 8 hours as needed for pain    . albuterol (PROVENTIL HFA;VENTOLIN HFA) 108 (90 BASE) MCG/ACT inhaler Inhale 2 puffs into the lungs every 6 (six) hours as needed for wheezing or shortness of breath.    Marland Kitchen albuterol (PROVENTIL) (2.5 MG/3ML) 0.083% nebulizer solution Take 2.5 mg by nebulization every 6 (six) hours as needed for wheezing or shortness of breath.    Marland Kitchen amiodarone (PACERONE) 100 MG tablet Take one tablet by mouth once daily for AFIB    . atorvastatin (LIPITOR) 40 MG tablet Take 40 mg by mouth daily.    . B Complex-C-Folic Acid (NEPHRO-VITE PO) Take 1 tablet by mouth daily.    . carvedilol (COREG) 6.25 MG tablet Take 1.5 tablets (9.375 mg total) by mouth 2 (two) times daily with a meal. 45 tablet 6  . clopidogrel (PLAVIX) 75 MG tablet Take 1 tablet (75 mg total) by mouth daily. 30 tablet 3  . docusate sodium (COLACE) 100 MG capsule Take 100 mg by mouth daily.     . insulin lispro (HUMALOG) 100 UNIT/ML injection Inject 10 Units into the skin 3 (three) times daily before meals. For BS > 140    . lisinopril (PRINIVIL,ZESTRIL) 5 MG tablet Take 1 tablet (5 mg total) by mouth daily. 30 tablet 6  . loratadine (CLARITIN) 10 MG tablet Take one tablet by mouth once daily for allergies    . Multiple Vitamin (MULTIVITAMIN) tablet Take 1 tablet by mouth daily.    . nitroGLYCERIN (NITROSTAT) 0.4 MG SL  tablet Place 1 tablet (0.4 mg total) under the tongue every 5 (five) minutes as needed for chest pain. 30 tablet 1  . warfarin (COUMADIN) 5 MG tablet Take 1 tablet (5 mg total) by mouth as directed. 45 tablet 3   No current facility-administered medications for this visit.    REVIEW OF SYSTEMS:  [X]  denotes positive finding, [ ]  denotes negative finding Cardiac  Comments:  Chest pain or chest pressure:    Shortness of breath upon exertion:    Short of breath when lying flat:    Irregular heart rhythm:    Constitutional    Fever or chills:      PHYSICAL EXAM: Filed Vitals:   11/07/15 0921  BP: 105/63  Pulse: 69  Temp: 97 F (36.1 C)  TempSrc: Oral  Height: 5\' 6"  (1.676 m)  Weight: 177 lb 4.8 oz (80.423 kg)  SpO2: 100%    GENERAL: The patient is a well-nourished male, in no acute distress. The vital signs are documented above. CARDIOVASCULAR: There is a regular rate and rhythm. PULMONARY: There is good air exchange bilaterally without wheezing or rales. This fistula has a good thrill just above the antecubital level but then is more difficult to follow.  DUPLEX LEFT UPPER ARM AV FISTULA: I have independently interpreted his duplex of his left upper arm fistula.  The diameters of the fistula range from 0.4-0.58 cm. Thus  the fistula is currently not adequate in diameter. In addition, there are several areas of increased colostomy within the fistula and also multiple competing branches identified.  MEDICAL ISSUES:  POORLY MATURING LEFT BRACHIOCEPHALIC AV FISTULA: Given that the fistula is not maturing adequately and there appeared to be some areas of stenosis and also some competing branches, I have recommended that we proceed with a fistulogram. If he has stenoses amenable to venoplasty this could be addressed at the same time. If the competing branches appear to be significant then we can arrange to have these ligated. His procedure is scheduled for 11/19/2015. We will have to  hold his Coumadin for 4 days prior to the procedure.  Deitra Mayo [facility] [place]: [phone]

## 2015-11-08 ENCOUNTER — Other Ambulatory Visit: Payer: Self-pay

## 2015-11-08 ENCOUNTER — Telehealth (HOSPITAL_COMMUNITY): Payer: Self-pay | Admitting: *Deleted

## 2015-11-08 NOTE — Telephone Encounter (Signed)
History of CVA but also has ESRD so Lovenox bridging would be difficult.  Needs to minimize time off warfarin.  He also is on Plavix for stent in 7/16.  Can this be continued?

## 2015-11-08 NOTE — Telephone Encounter (Signed)
Stephanie at [facility] called, pt is sch for fistula 1/23 and they need approved for pt to be off coumadin 4 days prior.  Note can be faxed to her attention at [phone].  Will send to MD for review

## 2015-11-09 ENCOUNTER — Ambulatory Visit (HOSPITAL_COMMUNITY): Payer: Medicare HMO

## 2015-11-09 ENCOUNTER — Encounter (HOSPITAL_COMMUNITY)
Admission: RE | Admit: 2015-11-09 | Discharge: 2015-11-09 | Disposition: A | Discharge status: Home / Self Care | Payer: Medicare HMO | Source: Ambulatory Visit | Attending: Cardiology | Admitting: Cardiology

## 2015-11-09 DIAGNOSIS — Z9861 Coronary angioplasty status: Secondary | ICD-10-CM | POA: Diagnosis not present

## 2015-11-09 LAB — GLUCOSE, CAPILLARY: GLUCOSE-CAPILLARY: 118 mg/dL — AB (ref 65–99)

## 2015-11-12 ENCOUNTER — Encounter (HOSPITAL_COMMUNITY)
Admission: RE | Admit: 2015-11-12 | Discharge: 2015-11-12 | Disposition: A | Discharge status: Home / Self Care | Payer: Medicare HMO | Source: Ambulatory Visit | Attending: Cardiology | Admitting: Cardiology

## 2015-11-12 ENCOUNTER — Ambulatory Visit (HOSPITAL_COMMUNITY): Payer: Medicare HMO

## 2015-11-12 DIAGNOSIS — Z9861 Coronary angioplasty status: Secondary | ICD-10-CM | POA: Diagnosis not present

## 2015-11-12 LAB — GLUCOSE, CAPILLARY: GLUCOSE-CAPILLARY: 143 mg/dL — AB (ref 65–99)

## 2015-11-13 ENCOUNTER — Telehealth: Payer: Self-pay | Admitting: *Deleted

## 2015-11-13 NOTE — Telephone Encounter (Signed)
[facility] aware

## 2015-11-13 NOTE — Telephone Encounter (Signed)
Received verbal approval from [place], for patient to stop Coumadin after his 11-14-15 dose. Patient will stay on Plavix and he will start the Coumadin back on the night of  11-19-15 after his surgery that morning. I called Tony Freeman and discussed all this information. Patient verbalized understanding and agreement of this plan.

## 2015-11-14 ENCOUNTER — Ambulatory Visit (HOSPITAL_COMMUNITY): Payer: Medicare HMO

## 2015-11-14 ENCOUNTER — Encounter (HOSPITAL_COMMUNITY)
Admission: RE | Admit: 2015-11-14 | Discharge: 2015-11-14 | Disposition: A | Discharge status: Home / Self Care | Payer: Medicare HMO | Source: Ambulatory Visit | Attending: Cardiology | Admitting: Cardiology

## 2015-11-14 ENCOUNTER — Ambulatory Visit (INDEPENDENT_AMBULATORY_CARE_PROVIDER_SITE_OTHER): Payer: Medicare HMO | Admitting: Pharmacist

## 2015-11-14 DIAGNOSIS — Z9861 Coronary angioplasty status: Secondary | ICD-10-CM | POA: Diagnosis not present

## 2015-11-14 DIAGNOSIS — I4891 Unspecified atrial fibrillation: Secondary | ICD-10-CM

## 2015-11-14 DIAGNOSIS — Z5181 Encounter for therapeutic drug level monitoring: Secondary | ICD-10-CM | POA: Diagnosis not present

## 2015-11-14 LAB — GLUCOSE, CAPILLARY: GLUCOSE-CAPILLARY: 153 mg/dL — AB (ref 65–99)

## 2015-11-14 LAB — POCT INR: INR: 2.6

## 2015-11-16 ENCOUNTER — Ambulatory Visit (HOSPITAL_COMMUNITY)
Admission: RE | Admit: 2015-11-16 | Discharge: 2015-11-16 | Disposition: A | Discharge status: Home / Self Care | Payer: Medicare HMO | Source: Ambulatory Visit | Attending: Cardiology | Admitting: Cardiology

## 2015-11-16 ENCOUNTER — Ambulatory Visit (HOSPITAL_COMMUNITY): Payer: Medicare HMO

## 2015-11-16 ENCOUNTER — Encounter (HOSPITAL_COMMUNITY)
Admission: RE | Admit: 2015-11-16 | Discharge: 2015-11-16 | Disposition: A | Discharge status: Home / Self Care | Payer: Medicare HMO | Source: Ambulatory Visit | Attending: Cardiology | Admitting: Cardiology

## 2015-11-16 VITALS — BP 90/60 | HR 98 | Wt 175.8 lb

## 2015-11-16 DIAGNOSIS — R05 Cough: Secondary | ICD-10-CM | POA: Diagnosis not present

## 2015-11-16 DIAGNOSIS — Z7901 Long term (current) use of anticoagulants: Secondary | ICD-10-CM | POA: Diagnosis not present

## 2015-11-16 DIAGNOSIS — N186 End stage renal disease: Secondary | ICD-10-CM | POA: Insufficient documentation

## 2015-11-16 DIAGNOSIS — E785 Hyperlipidemia, unspecified: Secondary | ICD-10-CM | POA: Diagnosis not present

## 2015-11-16 DIAGNOSIS — K869 Disease of pancreas, unspecified: Secondary | ICD-10-CM | POA: Diagnosis not present

## 2015-11-16 DIAGNOSIS — Z955 Presence of coronary angioplasty implant and graft: Secondary | ICD-10-CM | POA: Insufficient documentation

## 2015-11-16 DIAGNOSIS — J45909 Unspecified asthma, uncomplicated: Secondary | ICD-10-CM | POA: Diagnosis not present

## 2015-11-16 DIAGNOSIS — I2510 Atherosclerotic heart disease of native coronary artery without angina pectoris: Secondary | ICD-10-CM | POA: Diagnosis not present

## 2015-11-16 DIAGNOSIS — Z794 Long term (current) use of insulin: Secondary | ICD-10-CM | POA: Diagnosis not present

## 2015-11-16 DIAGNOSIS — I5022 Chronic systolic (congestive) heart failure: Secondary | ICD-10-CM | POA: Diagnosis not present

## 2015-11-16 DIAGNOSIS — Z9861 Coronary angioplasty status: Secondary | ICD-10-CM | POA: Diagnosis not present

## 2015-11-16 DIAGNOSIS — E1121 Type 2 diabetes mellitus with diabetic nephropathy: Secondary | ICD-10-CM | POA: Insufficient documentation

## 2015-11-16 DIAGNOSIS — I120 Hypertensive chronic kidney disease with stage 5 chronic kidney disease or end stage renal disease: Secondary | ICD-10-CM | POA: Insufficient documentation

## 2015-11-16 DIAGNOSIS — I255 Ischemic cardiomyopathy: Secondary | ICD-10-CM | POA: Diagnosis not present

## 2015-11-16 DIAGNOSIS — Z79899 Other long term (current) drug therapy: Secondary | ICD-10-CM | POA: Diagnosis not present

## 2015-11-16 DIAGNOSIS — I480 Paroxysmal atrial fibrillation: Secondary | ICD-10-CM | POA: Insufficient documentation

## 2015-11-16 DIAGNOSIS — K8689 Other specified diseases of pancreas: Secondary | ICD-10-CM

## 2015-11-16 LAB — LIPID PANEL
CHOL/HDL RATIO: 3.2 ratio
Cholesterol: 110 mg/dL (ref 0–200)
HDL: 34 mg/dL — AB (ref >40)
LDL Cholesterol: 64 mg/dL (ref 0–99)
TRIGLYCERIDES: 58 mg/dL (ref <150)
VLDL: 12 mg/dL (ref 0–40)

## 2015-11-16 LAB — GLUCOSE, CAPILLARY
GLUCOSE-CAPILLARY: 115 mg/dL — AB (ref 65–99)
Glucose-Capillary: 120 mg/dL — ABNORMAL HIGH (ref 65–99)

## 2015-11-16 MED ORDER — LOSARTAN POTASSIUM 25 MG PO TABS: 25.0000 mg | ORAL_TABLET | Freq: Every day | ORAL | Status: DC

## 2015-11-16 NOTE — Patient Instructions (Addendum)
Labs today  STOP Lisinopril START Losartan 25 mg, one tab daily  CT Scan will be scheduled  Your physician recommends that you schedule a follow-up appointment in: 3 months  Do the following things EVERYDAY: 1) Weigh yourself in the morning before breakfast. Write it down and keep it in a log. 2) Take your medicines as prescribed 3) Eat low salt foods-Limit salt (sodium) to 2000 mg per day.  4) Stay as active as you can everyday 5) Limit all fluids for the day to less than 2 liters

## 2015-11-17 NOTE — Progress Notes (Signed)
Patient ID: Joseh Sjogren, male   DOB: 1951-06-16, 65 y.o.   MRN: [id number] Cardiology: Dr. Aundra Dubin PCP: Dr. Tillman Sers  HPI: Mr. Stracke is a 65 year old man with history of atrial fibrillation, CAD (s/p multiple stents) chronic combined CHF (echo 04/02/2014 with LV EF: 25-30%, diffuse hypokinesis), ischemic cardiomyopathy s/p dual chamber Biotronik ICD placement 06/29/2014, DM2, ESRD, asthma, HLD, abnormal renal US 03/2014 (followed by urology).  He has a h/o PCI LAD/RCA in 2007, PCI LAD/RCA in 2009 at [facility], abnormal lexiscan stress test 06/2013 moderate reversible perfusion abnormality in anterior, septal and apical region, then PCI to LAD 08/2013. [facility] records indicate that coronary angio at that time also showed CTO of RCA and nonobstructive disease in the LCx. Echo at [place] at least back to 01/2013 showed EF 35-40%, then <25% in 10/7913 with diastolic dysfunction and elevated RVSP. During his 03/2014 admission at [facility], he was diagnosed with new onset atrial fib-vs-flutter. Echo showed false tendon in LV apex, moderate LVH, EF 25-30%, diffuse HK, mod LAE, trivial TR but no mention of pulm HTN. He also had one troponin that was 0.11 and the rest were negative, felt due to CKD and intracranial etiology with some encephalopathy. He was discharged on Coumadin (along with his Plavix that he was on from prior PCI/stroke). However, it's not totally clear what he did with the Coumadin as it was no longer on his list when he saw a cardiologist in the [facility] system at the end of June. The patient thinks his cardiologist took him off it but the note doesn't reflect that (the patient does have some cognitive difficulties since his stroke and lives with his son who manages his medicine). He went on to have ICD placement on 06/29/14. Reading their notes, it appears they were not aware of his 03/2014 diagnosis of atrial fibrillation until it was picked up on his ICD interrogation in early September at which time  he was placed on Edoxaban.   Admitted 07/16/14 with increased dyspnea. Found to be in atrial fibrillation with RVR. Had TEE/DC-CV. TEE showed EF 25%, RV mildly dilated and moderately to severely dysfunctional.  Successful cardioversion completed. He required IV lasix and short term milrinone. He transitioned to torsemide 40 mg twice a day. He remained on edoxaban. He was discharged on amiodarone 400 mg daily, carvedilol 6.25 mg twice a day, and hydralazine 50 mg tid/imdur 60 mg daily. Discharged weight was 204 pounds.   Cardiolite in 2/16 showed EF 31%, no ischemia.    He was admitted in 3/16 with acute bronchitis, hypercalcemia, and AKI with creatinine up to 5.5.   Bone marrow biopsy in 4/16 showed no significant abnormalities.     He seemed to do well until 7/16 when he had a cardiac [place] (apparently PEA, had CPR and epinephrine).  He was admitted to [facility].  Coronary angiography was done, and he had PTCA to proximal LAD in-stent restenosis.  The RCA had a chronic total occlusion.  He developed AKI and progressed to ESRD, now on HD.  He went to SNF initially after hospitalization, now is home with his son.    He is doing ok.  No tachypalpitations, remains in NSR today.  Mild dyspnea walking up steps.  Able to walk 8 times around track at cardiac rehab then tired.  Chronic 2 pillow orthopnea.  No chest pain. No melena/BRBPR, continues on warfarin.  He has a chronic dry cough. Weight down 4 lbs since last appointment.  He had recent abdominal US showing fullness in the pancreatic head.   ECHO 03/2014 EF 25-30%  ECHO 8/16 EF 15%, mild LV dilation, mild-moderate MR, severely decreased RV systolic function, PASP 63 mmHg.   Labs  07/21/14 TSH 0.413  10/15 K 3.5 => 4.3 Creatinine 3.86 => 3.96, Na 137  3/16 TSH normal 4/16 K 3.8, creatinine 4.62, HCT 28.8, AST/ALT normal 7/16 HCT 30 8/16 AST/ALT normal, TSH normal 10/16 HCT 32.5 12/16 AST/ALT normal, tbili 1.9, TSH  normal  ROS: All systems negative except as listed in HPI, PMH and Problem List.  Past Medical History  Diagnosis Date  . Systolic and diastolic CHF, chronic ([place])     a. Echo at [facility] 11/17/2013 Ef <25%, severe hypokinesis of LV, moderately dilated LA, grade IV/IV diastolic dysfunction with irreversible restrictive pattern of mitral inflow, severe pulm HTN (RVSP >80mHg), 1-2+ MR  b[facility] EF 25-30%, moderate concentric LVH, diffuse hypokinesis, moderately dilated LA, no significant MR observed  . CAD (coronary artery disease)     a. PCI LAD/RCA in 2007  b[facility]. PCI LAD 08/2013, CTO of RCA and nonobstructive disease in the LCx.  .Marland KitchenHyperlipidemia   . Dementia   . Ischemic cardiomyopathy     a. s/p Biotronik ICD 06/2014.  .Marland KitchenRetinopathy     bilateral  . HTN (hypertension)   . Abnormal renal ultrasound     a. 03/2014: Abnormal renal ultrasound with left renal lesion and bladder wall thickening and microscopic hematuria. Multiple cysts on prior MRI. Given inability to use contrast, f/u imaging limited, repeat [place] with outpatient uro f/u.  .Marland KitchenParoxysmal atrial fibrillation (HCC)   . Paroxysmal atrial flutter ([place]   . Anemia   . Myocardial infarction ([facility] July 2016    Heart attack  . Peripheral vascular disease ([place]   . Stroke ([facility]     right arm weakness  . Asthma   . CKD (chronic kidney disease), stage IV ([place]     on dialysis T/TH/Sa  . 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 H/O cardiac arrest 04/2015  . Constipation     Current Outpatient Prescriptions  Medication Sig Dispense Refill  . acetaminophen (TYLENOL) 325 MG tablet Take 650 mg by mouth every 8 (eight) hours as needed for headache (pain).     .Marland Kitchenalbuterol (PROVENTIL HFA;VENTOLIN HFA) 108 (90 BASE) MCG/ACT inhaler Inhale 2 puffs into the lungs every 6 (six) hours as needed for wheezing or shortness of breath.    .Marland Kitchenalbuterol (PROVENTIL) (2.5 MG/3ML)  0.083% nebulizer solution Take 2.5 mg by nebulization every 6 (six) hours as needed for wheezing or shortness of breath.    .Marland Kitchenamiodarone (PACERONE) 100 MG tablet Take 100 mg by mouth daily. for AFIB    . atorvastatin (LIPITOR) 40 MG tablet Take 40 mg by mouth daily.    . carvedilol (COREG) 6.25 MG tablet Take 1.5 tablets (9.375 mg total) by mouth 2 (two) times daily with a meal. 45 tablet 6  . clopidogrel (PLAVIX) 75 MG tablet Take 1 tablet (75 mg total) by mouth daily. 30 tablet 3  . docusate sodium (COLACE) 100 MG capsule Take 100 mg by mouth daily as needed (constipation).     . insulin lispro (HUMALOG) 100 UNIT/ML injection Inject 10 Units into the skin 2 (two) times daily as needed for high blood sugar (CBG >180).     .Marland Kitchen  loratadine (CLARITIN) 10 MG tablet Take 10 mg by mouth daily as needed for allergies.     . nitroGLYCERIN (NITROSTAT) 0.4 MG SL tablet Place 1 tablet (0.4 mg total) under the tongue every 5 (five) minutes as needed for chest pain. 30 tablet 1  . warfarin (COUMADIN) 5 MG tablet Take 1 tablet (5 mg total) by mouth as directed. (Patient taking differently: Take 2.5-5 mg by mouth daily. Take 1 tablet (5 mg) by mouth on Monday, Wednesday and Friday, take 1/2 tablet (2.5 mg) on Sunday, Tuesday, Thursday, Saturday) 45 tablet 3  . losartan (COZAAR) 25 MG tablet Take 1 tablet (25 mg total) by mouth daily. 90 tablet 3  . mometasone-formoterol (DULERA) 200-5 MCG/ACT AERO Inhale 2 puffs into the lungs 2 (two) times daily as needed for wheezing or shortness of breath.    . Multiple Vitamin (MULTIVITAMIN WITH MINERALS) TABS tablet Take 1 tablet by mouth daily.     No current facility-administered medications for this encounter.   PHYSICAL EXAM: Filed Vitals:   11/16/15 0850  BP: 90/60  Pulse: 98  Weight: 175 lb 12 oz (79.72 kg)  SpO2: 95%   General:  NAD HEENT: normal Neck: supple. JVP 8-9 cm. Carotids 2+ bilaterally; no bruits. No lymphadenopathy or thryomegaly appreciated. Cor:  PMI normal. Regular rate & rhythm. No rubs, gallops. 2/6 HSM LLSB.   Lungs: CTAB Abdomen: soft, nontender, nondistended. No hepatosplenomegaly. No bruits or masses. Good bowel sounds. Extremities: no cyanosis, clubbing, rash.  Trace ankle edema.   Neuro: alert & orientedx3, cranial nerves grossly intact. Moves all 4 extremities w/o difficulty. Affect pleasant.  ASSESSMENT & PLAN: 1. Chronic systolic HF: Ischemic cardiomyopathy. Has Biotronik ICD. ECHO 8/16 with EF 15%, severe RV systolic function. NYHA class II symptoms.  He still looks like he has some volume overload on exam.  Now ESRD and getting HD.  - If possible, would try to pull more fluid off at HD.    - Continue current Coreg given soft BP.   - With chronic cough, will stop lisinopril and start losartan 25 mg daily.    - Echo to reassess LV function.   2. Atrial fibrillation: Paroxysmal.  In 9/15, had TEE with no LAA thrombus and successful DC-CV.  He is in NSR today.  He continues on coumadin. - Continue amiodarone 100 mg daily, recent transaminases and TSH normal. He will need regular eye exams on amiodarone. - Continue warfarin, aim for INR 2-2.5 while on Plavix.  3. CAD: Cardiac arrest 7/16 with cath and cutting balloon angioplasty to proximal LAD in-stent restenosis.  Will continue Plavix/warfarin for 1 year as long as he tolerates then likely stop Plavix in 7/17. Continue statins, check lipids today.  4. ESRD: As above, hopefully can remove more fluid at HD.  5. Pancreatic fullness/mass:  Noted on ultrasound.  He needs a pancreas-protocol CT abdomen, will arrange.   Michelyn Scullin,MD 11/17/2015

## 2015-11-19 ENCOUNTER — Ambulatory Visit (HOSPITAL_COMMUNITY)
Admission: RE | Admit: 2015-11-19 | Discharge: 2015-11-19 | Disposition: A | Discharge status: Home / Self Care | Payer: Medicare HMO | Source: Ambulatory Visit | Attending: Vascular Surgery | Admitting: Vascular Surgery

## 2015-11-19 ENCOUNTER — Encounter (HOSPITAL_COMMUNITY)
Admission: RE | Disposition: A | Discharge status: Home / Self Care | Payer: Self-pay | Source: Ambulatory Visit | Attending: Vascular Surgery

## 2015-11-19 ENCOUNTER — Ambulatory Visit (HOSPITAL_COMMUNITY): Payer: Medicare HMO

## 2015-11-19 ENCOUNTER — Encounter (HOSPITAL_COMMUNITY): Payer: Self-pay | Admitting: *Deleted

## 2015-11-19 ENCOUNTER — Encounter (HOSPITAL_COMMUNITY): Payer: Medicare HMO

## 2015-11-19 ENCOUNTER — Encounter (HOSPITAL_COMMUNITY): Payer: Self-pay | Admitting: Vascular Surgery

## 2015-11-19 DIAGNOSIS — Z95810 Presence of automatic (implantable) cardiac defibrillator: Secondary | ICD-10-CM | POA: Diagnosis not present

## 2015-11-19 DIAGNOSIS — T82898A Other specified complication of vascular prosthetic devices, implants and grafts, initial encounter: Secondary | ICD-10-CM | POA: Diagnosis not present

## 2015-11-19 DIAGNOSIS — Z7901 Long term (current) use of anticoagulants: Secondary | ICD-10-CM | POA: Insufficient documentation

## 2015-11-19 DIAGNOSIS — N186 End stage renal disease: Secondary | ICD-10-CM | POA: Insufficient documentation

## 2015-11-19 DIAGNOSIS — Z7902 Long term (current) use of antithrombotics/antiplatelets: Secondary | ICD-10-CM | POA: Diagnosis not present

## 2015-11-19 DIAGNOSIS — T82858A Stenosis of vascular prosthetic devices, implants and grafts, initial encounter: Secondary | ICD-10-CM | POA: Insufficient documentation

## 2015-11-19 DIAGNOSIS — Y832 Surgical operation with anastomosis, bypass or graft as the cause of abnormal reaction of the patient, or of later complication, without mention of misadventure at the time of the procedure: Secondary | ICD-10-CM | POA: Insufficient documentation

## 2015-11-19 DIAGNOSIS — N185 Chronic kidney disease, stage 5: Secondary | ICD-10-CM

## 2015-11-19 DIAGNOSIS — Z992 Dependence on renal dialysis: Secondary | ICD-10-CM | POA: Insufficient documentation

## 2015-11-19 HISTORY — PX: PERIPHERAL VASCULAR CATHETERIZATION: SHX172C

## 2015-11-19 LAB — POCT I-STAT, CHEM 8
BUN: 53 mg/dL — AB (ref 6–20)
CALCIUM ION: 1.17 mmol/L (ref 1.13–1.30)
CHLORIDE: 96 mmol/L — AB (ref 101–111)
CREATININE: 5.5 mg/dL — AB (ref 0.61–1.24)
GLUCOSE: 106 mg/dL — AB (ref 65–99)
HCT: 39 % (ref 39.0–52.0)
Hemoglobin: 13.3 g/dL (ref 13.0–17.0)
Potassium: 5.4 mmol/L — ABNORMAL HIGH (ref 3.5–5.1)
Sodium: 135 mmol/L (ref 135–145)
TCO2: 31 mmol/L (ref 0–100)

## 2015-11-19 LAB — GLUCOSE, CAPILLARY: Glucose-Capillary: 82 mg/dL (ref 65–99)

## 2015-11-19 LAB — PROTIME-INR
INR: 1.61 — ABNORMAL HIGH (ref 0.00–1.49)
Prothrombin Time: 19.1 seconds — ABNORMAL HIGH (ref 11.6–15.2)

## 2015-11-19 SURGERY — A/V SHUNTOGRAM/FISTULAGRAM

## 2015-11-19 MED ORDER — SODIUM CHLORIDE 0.9 % IJ SOLN: 3.0000 mL | INTRAMUSCULAR | Status: DC | PRN

## 2015-11-19 MED ORDER — LIDOCAINE HCL (PF) 1 % IJ SOLN
INTRAMUSCULAR | Status: AC
  Filled 2015-11-19: qty 30

## 2015-11-19 MED ORDER — HEPARIN (PORCINE) IN NACL 2-0.9 UNIT/ML-% IJ SOLN
INTRAMUSCULAR | Status: AC
  Filled 2015-11-19: qty 500

## 2015-11-19 MED ORDER — IODIXANOL 320 MG/ML IV SOLN
INTRAVENOUS | Status: DC | PRN
  Administered 2015-11-19: 30 mL via INTRAVENOUS

## 2015-11-19 MED ORDER — LIDOCAINE HCL (PF) 1 % IJ SOLN
INTRAMUSCULAR | Status: DC | PRN
  Administered 2015-11-19: 08:00:00

## 2015-11-19 SURGICAL SUPPLY — 11 items
BAG SNAP BAND KOVER 36X36 (MISCELLANEOUS) ×3 IMPLANT
COVER DOME SNAP 22 D (MISCELLANEOUS) ×3 IMPLANT
COVER PRB 48X5XTLSCP FOLD TPE (BAG) ×1 IMPLANT
COVER PROBE 5X48 (BAG) ×2
KIT MICROINTRODUCER STIFF 5F (SHEATH) ×3 IMPLANT
PROTECTION STATION PRESSURIZED (MISCELLANEOUS) ×3
STATION PROTECTION PRESSURIZED (MISCELLANEOUS) ×1 IMPLANT
STOPCOCK MORSE 400PSI 3WAY (MISCELLANEOUS) ×3 IMPLANT
TRAY PV CATH (CUSTOM PROCEDURE TRAY) ×3 IMPLANT
TUBING CIL FLEX 10 FLL-RA (TUBING) ×3 IMPLANT
WIRE BENTSON .035X145CM (WIRE) ×3 IMPLANT

## 2015-11-19 NOTE — Interval H&P Note (Signed)
History and Physical Interval Note:  11/19/2015 7:40 AM  [name]  has presented today for surgery, with the diagnosis of left arm/end stage renal  The various methods of treatment have been discussed with the patient and family. After consideration of risks, benefits and other options for treatment, the patient has consented to  Procedure(s): Fistulagram (N/A) as a surgical intervention .  The patient's history has been reviewed, patient examined, no change in status, stable for surgery.  I have reviewed the patient's chart and labs.  Questions were answered to the patient's satisfaction.     Tony Freeman

## 2015-11-19 NOTE — Op Note (Signed)
   PATIENT: Tony Freeman  MRN: [id number] DOB: September 22, 1951    DATE OF PROCEDURE: 11/19/2015  INDICATIONS: Tony Freeman is a 65 y.o. male with a poorly functioning left brachiocephalic AV fistula. He has a functioning right IJ tunneled dialysis catheter. He has a AICD on the left side.  PROCEDURE:  1. Ultrasound-guided access to the left brachiocephalic AV fistula 2. Fistulogram left brachiocephalic AV fistula  SURGEON: Judeth Cornfield. Scot Dock, MD, FACS  ANESTHESIA: local   EBL: minimal  TECHNIQUE: The patient was taken to the peripheral vascular lab. The left arm was prepped and draped in the usual sterile fashion. Under ultrasound guidance, after the skin was anesthetized, the proximal fistula was cannulated with a micropuncture needle and a micropuncture sheath introduced over the wire. A fistulogram was then obtained evaluating the fistula from the cannulation site to the central venous system. A blood pressure cuff was inflated to supra-systolic pressures and a retrograde shot obtained to evaluate the arterial anastomosis. At the completion a 4-0 Monocryl suture was used for hemostasis.  FINDINGS:  1. There is some mild stenosis of the proximal anastomosis of the left brachiocephalic AV fistula. 2. There are several small competing branches of the left brachiocephalic AV fistula. 3. There is a stenosis in the central portion of the fistula and just beyond this the cephalic vein is completely occluded and the fistula is being maintained by collaterals. The subclavian vein on the left is occluded. There are extensive collaterals around the shoulder.  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 I will arrange for him to be scheduled for new access in the right arm. I have reviewed his previous vein map. Cephalic vein on the right appears marginal in size. He  could potentially be a candidate for a basilic vein transposition on the right. Otherwise he would require placement of an AV graft. I will try to schedule this for a nondialysis day tentatively, 11/30/2015.  Deitra Mayo, MD, FACS [facility]  DATE OF DICTATION:   11/19/2015

## 2015-11-19 NOTE — H&P (View-Only) (Signed)
Patient name: Tony Freeman MRN: [id number] DOB: 06-Feb-1951 Sex: male  REASON FOR VISIT: Follow up after left brachiocephalic AV fistula  HPI: Tony Freeman is a 65 y.o. male who presented for new access and underwent a left brachiocephalic AV fistula on 123456. He returns for a routine follow up visit. He dialyzes on Tuesdays Thursdays and Saturdays. He has a functioning right IJ tunneled dialysis catheter. He denies any uremic symptoms.  Current Outpatient Prescriptions  Medication Sig Dispense Refill  . acetaminophen (TYLENOL) 325 MG tablet Take two tablets by mouth every 8 hours as needed for pain    . albuterol (PROVENTIL HFA;VENTOLIN HFA) 108 (90 BASE) MCG/ACT inhaler Inhale 2 puffs into the lungs every 6 (six) hours as needed for wheezing or shortness of breath.    Marland Kitchen albuterol (PROVENTIL) (2.5 MG/3ML) 0.083% nebulizer solution Take 2.5 mg by nebulization every 6 (six) hours as needed for wheezing or shortness of breath.    Marland Kitchen amiodarone (PACERONE) 100 MG tablet Take one tablet by mouth once daily for AFIB    . atorvastatin (LIPITOR) 40 MG tablet Take 40 mg by mouth daily.    . B Complex-C-Folic Acid (NEPHRO-VITE PO) Take 1 tablet by mouth daily.    . carvedilol (COREG) 6.25 MG tablet Take 1.5 tablets (9.375 mg total) by mouth 2 (two) times daily with a meal. 45 tablet 6  . clopidogrel (PLAVIX) 75 MG tablet Take 1 tablet (75 mg total) by mouth daily. 30 tablet 3  . docusate sodium (COLACE) 100 MG capsule Take 100 mg by mouth daily.     . insulin lispro (HUMALOG) 100 UNIT/ML injection Inject 10 Units into the skin 3 (three) times daily before meals. For BS > 140    . lisinopril (PRINIVIL,ZESTRIL) 5 MG tablet Take 1 tablet (5 mg total) by mouth daily. 30 tablet 6  . loratadine (CLARITIN) 10 MG tablet Take one tablet by mouth once daily for allergies    . Multiple Vitamin (MULTIVITAMIN) tablet Take 1 tablet by mouth daily.    . nitroGLYCERIN (NITROSTAT) 0.4 MG SL  tablet Place 1 tablet (0.4 mg total) under the tongue every 5 (five) minutes as needed for chest pain. 30 tablet 1  . warfarin (COUMADIN) 5 MG tablet Take 1 tablet (5 mg total) by mouth as directed. 45 tablet 3   No current facility-administered medications for this visit.    REVIEW OF SYSTEMS:  [X]  denotes positive finding, [ ]  denotes negative finding Cardiac  Comments:  Chest pain or chest pressure:    Shortness of breath upon exertion:    Short of breath when lying flat:    Irregular heart rhythm:    Constitutional    Fever or chills:      PHYSICAL EXAM: Filed Vitals:   11/07/15 0921  BP: 105/63  Pulse: 69  Temp: 97 F (36.1 C)  TempSrc: Oral  Height: 5\' 6"  (1.676 m)  Weight: 177 lb 4.8 oz (80.423 kg)  SpO2: 100%    GENERAL: The patient is a well-nourished male, in no acute distress. The vital signs are documented above. CARDIOVASCULAR: There is a regular rate and rhythm. PULMONARY: There is good air exchange bilaterally without wheezing or rales. This fistula has a good thrill just above the antecubital level but then is more difficult to follow.  DUPLEX LEFT UPPER ARM AV FISTULA: I have independently interpreted his duplex of his left upper arm fistula.  The diameters of the fistula range from 0.4-0.58 cm. Thus  the fistula is currently not adequate in diameter. In addition, there are several areas of increased colostomy within the fistula and also multiple competing branches identified.  MEDICAL ISSUES:  POORLY MATURING LEFT BRACHIOCEPHALIC AV FISTULA: Given that the fistula is not maturing adequately and there appeared to be some areas of stenosis and also some competing branches, I have recommended that we proceed with a fistulogram. If he has stenoses amenable to venoplasty this could be addressed at the same time. If the competing branches appear to be significant then we can arrange to have these ligated. His procedure is scheduled for 11/19/2015. We will have to  hold his Coumadin for 4 days prior to the procedure.  Deitra Mayo [facility] [place]: [phone]

## 2015-11-19 NOTE — Discharge Instructions (Signed)
AV Fistula, Care After Refer to this sheet in the next few weeks. These instructions provide you with information on caring for yourself after your procedure. Your caregiver may also give you more specific instructions. Your treatment has been planned according to current medical practices, but problems sometimes occur. Call your caregiver if you have any problems or questions after your procedure. HOME CARE INSTRUCTIONS   Do not drive a car or take public transportation alone.  Do not drink alcohol.  Only take medicine that has been prescribed by your caregiver.  Do not sign important papers or make important decisions.  Have a responsible person with you.  Ask your caregiver to show you how to check your access at home for a vibration (called a "thrill") or for a sound (called a "bruit" pronounced brew-ee).  Your vein will need time to enlarge and mature so needles can be inserted for dialysis. Follow your caregiver's instructions about what you need to do to make this happen.  Keep dressings clean and dry.  Keep the arm elevated above your heart. Use a pillow.  Rest.  Use the arm as usual for all activities.  Have the stitches or tape closures removed in 10 to 14 days, or as directed by your caregiver.  Do not sleep or lie on the area of the fistula or that arm. This may decrease or stop the blood flow through your fistula.  Do not allow blood pressures to be taken on this arm.  Do not allow blood drawing to be done from the graft.  Do not wear tight clothing around the access site or on the arm.  Avoid lifting heavy objects with the arm that has the fistula.  Do not use creams or lotions over the access site. SEEK MEDICAL CARE IF:   You have a fever.  You have swelling around the fistula that gets worse, or you have new pain.  You have unusual bleeding at the fistula site or from any other area.  You have pus or other drainage at the fistula site.  You have skin  redness or red streaking on the skin around, above, or below the fistula site.  Your access site feels warm.  You have any flu-like symptoms. SEEK IMMEDIATE MEDICAL CARE IF:   You have pain, numbness, or an unusual pale skin on the hand or on the side of your fistula.  You have dizziness or weakness that you have not had before.  You have shortness of breath.  You have chest pain.  Your fistula disconnects or breaks, and there is bleeding that cannot be easily controlled. Call for local emergency medical help. Do not try to drive yourself to the hospital. MAKE SURE YOU  Understand these instructions.  Will watch your condition.  Will get help right away if you are not doing well or get worse.   This information is not intended to replace advice given to you by your health care provider. Make sure you discuss any questions you have with your health care provider.   Document Released: 10/13/2005 Document Revised: 11/03/2014 Document Reviewed: 04/02/2011 Elsevier Interactive Patient Education 2016 Elsevier Inc.  

## 2015-11-20 ENCOUNTER — Other Ambulatory Visit: Payer: Self-pay

## 2015-11-20 ENCOUNTER — Telehealth (HOSPITAL_COMMUNITY): Payer: Self-pay | Admitting: Cardiology

## 2015-11-20 NOTE — Telephone Encounter (Signed)
History of CVA.  Would talk to coumadin pharmacist about whether we have any bridging protocol for ESRD patients on coumadin.

## 2015-11-20 NOTE — Telephone Encounter (Signed)
Patient scheduled for R arm fistula placement on 11/30/15, provider would like to hold coumadin x 5 days prior to placement  Last dose would be 11/24/15  Ok to hold?

## 2015-11-21 ENCOUNTER — Encounter (HOSPITAL_COMMUNITY): Payer: Medicare HMO

## 2015-11-21 ENCOUNTER — Ambulatory Visit (HOSPITAL_COMMUNITY): Payer: Medicare HMO

## 2015-11-21 ENCOUNTER — Ambulatory Visit (HOSPITAL_COMMUNITY)
Admission: RE | Admit: 2015-11-21 | Discharge: 2015-11-21 | Disposition: A | Discharge status: Home / Self Care | Payer: Medicare HMO | Source: Ambulatory Visit | Attending: Cardiology | Admitting: Cardiology

## 2015-11-21 DIAGNOSIS — I10 Essential (primary) hypertension: Secondary | ICD-10-CM | POA: Insufficient documentation

## 2015-11-21 DIAGNOSIS — K869 Disease of pancreas, unspecified: Secondary | ICD-10-CM | POA: Diagnosis present

## 2015-11-21 DIAGNOSIS — I509 Heart failure, unspecified: Secondary | ICD-10-CM | POA: Diagnosis not present

## 2015-11-21 DIAGNOSIS — I700 Atherosclerosis of aorta: Secondary | ICD-10-CM | POA: Insufficient documentation

## 2015-11-21 DIAGNOSIS — K8689 Other specified diseases of pancreas: Secondary | ICD-10-CM

## 2015-11-21 DIAGNOSIS — N281 Cyst of kidney, acquired: Secondary | ICD-10-CM | POA: Diagnosis not present

## 2015-11-21 DIAGNOSIS — J90 Pleural effusion, not elsewhere classified: Secondary | ICD-10-CM | POA: Diagnosis not present

## 2015-11-21 DIAGNOSIS — R188 Other ascites: Secondary | ICD-10-CM | POA: Diagnosis not present

## 2015-11-21 DIAGNOSIS — K8020 Calculus of gallbladder without cholecystitis without obstruction: Secondary | ICD-10-CM | POA: Insufficient documentation

## 2015-11-21 DIAGNOSIS — E119 Type 2 diabetes mellitus without complications: Secondary | ICD-10-CM | POA: Insufficient documentation

## 2015-11-21 MED ORDER — IOHEXOL 300 MG/ML  SOLN
100.0000 mL | Freq: Once | INTRAMUSCULAR | Status: AC | PRN
  Administered 2015-11-21: 100 mL via INTRAVENOUS

## 2015-11-21 NOTE — Telephone Encounter (Signed)
Per phone note 11/08/15:    Larey Dresser, MD at 11/08/2015 10:56 PM     Status: Signed       Expand All Collapse All   History of CVA but also has ESRD so Lovenox bridging would be difficult. Needs to minimize time off warfarin. He also is on Plavix for stent in 7/16. Can this be continued?        Will send to [facility] for their input on Lovenox bridge

## 2015-11-21 NOTE — Progress Notes (Signed)
Patient started dialysis July 2016. Spoke with Dr. Aundra Dubin regarding patient remaining on dialysis. Patients dialysis doctor at [facility]. Dr. Aundra Dubin stated this test more important than not giving due to abnormal ultrasound for pancreas. Technologist spoke to Dr. Joneen Caraway radiologist and was ok with moving forward if Dr. Aundra Dubin had thought everything through.

## 2015-11-22 ENCOUNTER — Telehealth (HOSPITAL_COMMUNITY): Payer: Self-pay | Admitting: *Deleted

## 2015-11-22 NOTE — Telephone Encounter (Signed)
Maybe just a couple of doses before and after if you think it's reasonably safe.

## 2015-11-22 NOTE — Telephone Encounter (Signed)
-----   Message from Angelia Mould, MD sent at 11/22/2015 12:57 PM EST ----- Regarding: RE: When may pt return to cardiac rehab OK to resume cardiac rehab.  Thanks  ----- Message -----    From: Rowe Pavy, RN    Sent: 11/22/2015  11:25 AM      To: Angelia Mould, MD Subject: When may pt return to cardiac rehab             Dr. Scot Dock,  The above pt participates in cardiac rehab.  Noted that pt had outpatient procedure on Monday and has tentatively scheduled AV Graft to right arm on 2/3.  When may patient resume cardiac rehab? Or should he wait until after the AV graft is placed?   Thanks for your input  [facility] RN

## 2015-11-22 NOTE — Telephone Encounter (Signed)
We can bridge him at 1mg /kg once daily given his history of stroke if you would like [place] to.

## 2015-11-23 ENCOUNTER — Encounter (HOSPITAL_COMMUNITY)
Admission: RE | Admit: 2015-11-23 | Discharge: 2015-11-23 | Disposition: A | Discharge status: Home / Self Care | Payer: Medicare HMO | Source: Ambulatory Visit | Attending: Cardiology | Admitting: Cardiology

## 2015-11-23 ENCOUNTER — Ambulatory Visit (HOSPITAL_COMMUNITY): Payer: Medicare HMO

## 2015-11-23 DIAGNOSIS — Z9861 Coronary angioplasty status: Secondary | ICD-10-CM | POA: Diagnosis not present

## 2015-11-23 NOTE — Telephone Encounter (Signed)
Spoke with pt and son.  He will come to the office on 1/30 for Lovenox instructions.

## 2015-11-26 ENCOUNTER — Encounter (HOSPITAL_COMMUNITY)
Admission: RE | Admit: 2015-11-26 | Discharge: 2015-11-26 | Disposition: A | Discharge status: Home / Self Care | Payer: Medicare HMO | Source: Ambulatory Visit | Attending: Cardiology | Admitting: Cardiology

## 2015-11-26 ENCOUNTER — Ambulatory Visit (INDEPENDENT_AMBULATORY_CARE_PROVIDER_SITE_OTHER): Payer: Medicare HMO | Admitting: Pharmacist

## 2015-11-26 DIAGNOSIS — I4891 Unspecified atrial fibrillation: Secondary | ICD-10-CM | POA: Diagnosis not present

## 2015-11-26 DIAGNOSIS — Z5181 Encounter for therapeutic drug level monitoring: Secondary | ICD-10-CM | POA: Diagnosis not present

## 2015-11-26 DIAGNOSIS — Z9861 Coronary angioplasty status: Secondary | ICD-10-CM | POA: Diagnosis not present

## 2015-11-26 LAB — POCT INR: INR: 1.4

## 2015-11-26 MED ORDER — ENOXAPARIN SODIUM 80 MG/0.8ML ~~LOC~~ SOLN: 80.0000 mg | SUBCUTANEOUS | Status: DC

## 2015-11-26 NOTE — Patient Instructions (Addendum)
1/30-  Lovenox 80mg  in PM 1/31- Lovenox 80mg  in PM 2/1-  Lovenox 80mg  in PM 2/2- No Coumadin or Lovenox 2/3- Day of Procedure.  Do not take any blood thinners before you go to the hospital.  That night, take 1 1/2 tablets of Coumadin  2/4-  Coumadin 1 tablet AND Lovenox 80mg  in AM 2/5- Coumadin 1 tablet AND Lovenox 80mg  in AM 2/6- Coumadin 1 1/2 tablets AND Lovenox 80mg  in AM 2/7- Recheck INR

## 2015-11-27 ENCOUNTER — Encounter: Payer: Self-pay | Admitting: Cardiology

## 2015-11-28 ENCOUNTER — Telehealth (HOSPITAL_COMMUNITY): Payer: Self-pay | Admitting: *Deleted

## 2015-11-28 ENCOUNTER — Telehealth (HOSPITAL_COMMUNITY): Payer: Self-pay

## 2015-11-28 ENCOUNTER — Encounter (HOSPITAL_COMMUNITY): Admission: RE | Admit: 2015-11-28 | Payer: Medicare HMO | Source: Ambulatory Visit

## 2015-11-28 ENCOUNTER — Encounter (HOSPITAL_COMMUNITY): Payer: Self-pay | Admitting: *Deleted

## 2015-11-28 NOTE — Telephone Encounter (Signed)
Spoke with pt's son.  He will take last dose of Coumadin on 2/4 and start Lovenox on 2/6.  He has the previous instructions and will just change the dates.  Appt to recheck INR moved to 2/14

## 2015-11-28 NOTE — Progress Notes (Signed)
error 

## 2015-11-28 NOTE — Telephone Encounter (Signed)
Informed by procedural staff that AV fistualla was being postponed until 2/10 and they told him to restart his coumadin today and stop it again on the 4th. They were not sure about the lovenox. Routing to [employer], who gave the patient his Lovenox instructions so that she can contact the patient and give him further directions.

## 2015-11-28 NOTE — Progress Notes (Signed)
Pt has long history of heart disease. Denies any recent chest pain or sob. Pt is followed by Dr. Aundra Dubin, cardiologist. Pt states his fasting blood sugar runs between 110-150, not having to use his Humalog insulin as much, states he's not had any for almost a week. Instructed pt to only use it the AM of surgery if his blood sugar is greater than 220 and to only use 1/2 of his usual correction dose. Also instructed him to treat a blood sugar of 70 or less with 1/2 cup of clear juice (apple of cranberry) and to recheck his blood sugar fifteen minutes after drinking the juice. He voiced understanding. These instructions were given per Diabetes Medication Adjustment Guidelines Prior to Procedure and Surgery. Pt states he feels like he is getting a cold. States he has a cough and runny nose. He denies fever, bodyaches or chills. States he is coughing up white to green sputum, but nasal drainage is clear to white. I notified Stephanie at Dr. Nicole Cella office.

## 2015-11-28 NOTE — Telephone Encounter (Signed)
Pt called out for cardiac rehab.  Pt not feeling well complains of cold symptoms. Cherre Huger, BSN

## 2015-11-29 ENCOUNTER — Ambulatory Visit (HOSPITAL_COMMUNITY): Payer: Medicare HMO | Attending: Cardiovascular Disease

## 2015-11-29 ENCOUNTER — Other Ambulatory Visit: Payer: Self-pay

## 2015-11-29 DIAGNOSIS — E119 Type 2 diabetes mellitus without complications: Secondary | ICD-10-CM | POA: Diagnosis not present

## 2015-11-29 DIAGNOSIS — I071 Rheumatic tricuspid insufficiency: Secondary | ICD-10-CM | POA: Diagnosis not present

## 2015-11-29 DIAGNOSIS — I351 Nonrheumatic aortic (valve) insufficiency: Secondary | ICD-10-CM | POA: Diagnosis not present

## 2015-11-29 DIAGNOSIS — I5022 Chronic systolic (congestive) heart failure: Secondary | ICD-10-CM | POA: Insufficient documentation

## 2015-11-29 DIAGNOSIS — I059 Rheumatic mitral valve disease, unspecified: Secondary | ICD-10-CM | POA: Diagnosis not present

## 2015-11-29 DIAGNOSIS — I509 Heart failure, unspecified: Secondary | ICD-10-CM | POA: Diagnosis present

## 2015-11-29 DIAGNOSIS — I517 Cardiomegaly: Secondary | ICD-10-CM | POA: Diagnosis not present

## 2015-11-30 ENCOUNTER — Telehealth (HOSPITAL_COMMUNITY): Payer: Self-pay | Admitting: *Deleted

## 2015-11-30 ENCOUNTER — Encounter (HOSPITAL_COMMUNITY): Payer: Medicare HMO

## 2015-12-03 ENCOUNTER — Encounter (HOSPITAL_COMMUNITY)
Admission: RE | Admit: 2015-12-03 | Discharge: 2015-12-03 | Disposition: A | Discharge status: Home / Self Care | Payer: Medicare HMO | Source: Ambulatory Visit | Attending: Cardiology | Admitting: Cardiology

## 2015-12-03 DIAGNOSIS — Z9861 Coronary angioplasty status: Secondary | ICD-10-CM | POA: Insufficient documentation

## 2015-12-05 ENCOUNTER — Encounter (HOSPITAL_COMMUNITY)
Admission: RE | Admit: 2015-12-05 | Discharge: 2015-12-05 | Disposition: A | Discharge status: Home / Self Care | Payer: Medicare HMO | Source: Ambulatory Visit | Attending: Cardiology | Admitting: Cardiology

## 2015-12-05 DIAGNOSIS — Z9861 Coronary angioplasty status: Secondary | ICD-10-CM | POA: Diagnosis not present

## 2015-12-05 LAB — GLUCOSE, CAPILLARY: Glucose-Capillary: 172 mg/dL — ABNORMAL HIGH (ref 65–99)

## 2015-12-05 NOTE — Progress Notes (Addendum)
Spoke with pt last week for pre-op call and he was having cold symptoms and his surgery was rescheduled for this Friday, 12/07/15.  Called pt today to give him pre-op instructions, time of arrival of 7:00 AM. Pt states he is feeling better.  I have paged the rep for [employer] to notify them of pt's surgery.

## 2015-12-05 NOTE — Progress Notes (Signed)
Spoke with [place], rep for [employer]. She states someone will be here Friday around 9:00 AM to turn off ICD.

## 2015-12-06 MED ORDER — SODIUM CHLORIDE 0.9 % IV SOLN
INTRAVENOUS | Status: DC
  Administered 2016-02-12: 07:00:00 via INTRAVENOUS

## 2015-12-06 MED ORDER — CHLORHEXIDINE GLUCONATE CLOTH 2 % EX PADS: 6.0000 | MEDICATED_PAD | Freq: Once | CUTANEOUS | Status: DC

## 2015-12-06 MED ORDER — DEXTROSE 5 % IV SOLN: 1.5000 g | INTRAVENOUS | Status: AC

## 2015-12-07 ENCOUNTER — Ambulatory Visit (HOSPITAL_COMMUNITY): Admission: RE | Admit: 2015-12-07 | Payer: Medicare HMO | Source: Ambulatory Visit | Admitting: Vascular Surgery

## 2015-12-07 ENCOUNTER — Encounter (HOSPITAL_COMMUNITY): Admission: RE | Admit: 2015-12-07 | Payer: Medicare HMO | Source: Ambulatory Visit

## 2015-12-07 HISTORY — DX: Pneumonia, unspecified organism: J18.9

## 2015-12-07 HISTORY — DX: Presence of automatic (implantable) cardiac defibrillator: Z95.810

## 2015-12-07 SURGERY — ARTERIOVENOUS (AV) FISTULA CREATION
Anesthesia: Monitor Anesthesia Care | Laterality: Right

## 2015-12-07 MED ORDER — INSULIN ASPART 100 UNIT/ML IV SOLN
10.0000 [IU] | Freq: Once | INTRAVENOUS | Status: DC
  Filled 2015-12-07: qty 0.1

## 2015-12-10 ENCOUNTER — Encounter (HOSPITAL_COMMUNITY)
Admission: RE | Admit: 2015-12-10 | Discharge: 2015-12-10 | Disposition: A | Discharge status: Home / Self Care | Payer: Medicare HMO | Source: Ambulatory Visit | Attending: Cardiology | Admitting: Cardiology

## 2015-12-10 DIAGNOSIS — Z9861 Coronary angioplasty status: Secondary | ICD-10-CM | POA: Diagnosis not present

## 2015-12-10 LAB — GLUCOSE, CAPILLARY: GLUCOSE-CAPILLARY: 132 mg/dL — AB (ref 65–99)

## 2015-12-11 ENCOUNTER — Other Ambulatory Visit: Payer: Self-pay | Admitting: Cardiology

## 2015-12-12 ENCOUNTER — Other Ambulatory Visit: Payer: Self-pay

## 2015-12-12 ENCOUNTER — Encounter (HOSPITAL_COMMUNITY)
Admission: RE | Admit: 2015-12-12 | Discharge: 2015-12-12 | Disposition: A | Discharge status: Home / Self Care | Payer: Medicare HMO | Source: Ambulatory Visit | Attending: Cardiology | Admitting: Cardiology

## 2015-12-12 DIAGNOSIS — Z9861 Coronary angioplasty status: Secondary | ICD-10-CM | POA: Diagnosis not present

## 2015-12-12 DIAGNOSIS — I255 Ischemic cardiomyopathy: Secondary | ICD-10-CM

## 2015-12-12 LAB — GLUCOSE, CAPILLARY: Glucose-Capillary: 180 mg/dL — ABNORMAL HIGH (ref 65–99)

## 2015-12-12 MED ORDER — ATORVASTATIN CALCIUM 40 MG PO TABS: 40.0000 mg | ORAL_TABLET | Freq: Every day | ORAL | Status: DC

## 2015-12-14 ENCOUNTER — Encounter (HOSPITAL_COMMUNITY)
Admission: RE | Admit: 2015-12-14 | Discharge: 2015-12-14 | Disposition: A | Discharge status: Home / Self Care | Payer: Medicare HMO | Source: Ambulatory Visit | Attending: Cardiology | Admitting: Cardiology

## 2015-12-14 ENCOUNTER — Telehealth (HOSPITAL_COMMUNITY): Payer: Self-pay | Admitting: *Deleted

## 2015-12-14 DIAGNOSIS — Z9861 Coronary angioplasty status: Secondary | ICD-10-CM | POA: Diagnosis not present

## 2015-12-14 LAB — GLUCOSE, CAPILLARY: GLUCOSE-CAPILLARY: 190 mg/dL — AB (ref 65–99)

## 2015-12-14 NOTE — Telephone Encounter (Signed)
-----   Message from Larey Dresser, MD sent at 12/13/2015 10:39 PM EST ----- Regarding: FW: Return of afib He is back in atrial fibrillation, needs followup with [place].  ----- Message -----    From: Rowe Pavy, RN    Sent: 12/13/2015  12:18 PM      To: Larey Dresser, MD Subject: Return of afib                                 Dr. Aundra Dubin,  Upon closer review of patient strips from yesterday, it appears the above patient is in afib at a controlled rate 80-90's.  Per his history noted on his last clinic visit with you on 1/20, pt with paroxysmal afib. Pt is anticoagulated with Coumadin.  Unsure of his INR levels, I did not see any in epic and perhaps he has this done in dialysis?? Is this expected for this patient?  Any further interventions needed?   Pt will graduate from the telemetry program on next Wednesday, preparations have been completed for him to transfer to the maintenance program.  Thanks for the input [facility] RN

## 2015-12-14 NOTE — Telephone Encounter (Signed)
Spoke w/pt's son, appt sch for Tue 2/21 at [facility] [place], [place]

## 2015-12-17 ENCOUNTER — Encounter (HOSPITAL_COMMUNITY)
Admission: RE | Admit: 2015-12-17 | Discharge: 2015-12-17 | Disposition: A | Discharge status: Home / Self Care | Payer: Medicare HMO | Source: Ambulatory Visit | Attending: Cardiology | Admitting: Cardiology

## 2015-12-17 DIAGNOSIS — Z9861 Coronary angioplasty status: Secondary | ICD-10-CM | POA: Diagnosis not present

## 2015-12-17 LAB — GLUCOSE, CAPILLARY: Glucose-Capillary: 129 mg/dL — ABNORMAL HIGH (ref 65–99)

## 2015-12-17 NOTE — Progress Notes (Signed)
Patient ID: Trindon Dorton, male   DOB: Jun 24, 1951, 65 y.o.   MRN: [id number]    Advanced Heart Failure Clinic Note   Cardiology: Dr. Aundra Dubin PCP: Dr. Tillman Sers  HPI: [name] is a 65 year old man with history of atrial fibrillation, CAD (s/p multiple stents) chronic combined CHF (echo 04/02/2014 with LV EF: 25-30%, diffuse hypokinesis), ischemic cardiomyopathy s/p dual chamber Biotronik ICD placement 06/29/2014, DM2, ESRD, asthma, HLD, abnormal renal US 03/2014 (followed by urology).  He has a h/o PCI LAD/RCA in 2007, PCI LAD/RCA in 2009 at [facility], abnormal lexiscan stress test 06/2013 moderate reversible perfusion abnormality in anterior, septal and apical region, then PCI to LAD 08/2013. [facility] records indicate that coronary angio at that time also showed CTO of RCA and nonobstructive disease in the LCx. Echo at [facility] at least back to 01/2013 showed EF 35-40%, then <25% in 04/6194 with diastolic dysfunction and elevated RVSP. During his 03/2014 admission at [facility], he was diagnosed with new onset atrial fib-vs-flutter. Echo showed false tendon in LV apex, moderate LVH, EF 25-30%, diffuse HK, mod LAE, trivial TR but no mention of pulm HTN. He also had one troponin that was 0.11 and the rest were negative, felt due to CKD and intracranial etiology with some encephalopathy. He was discharged on Coumadin (along with his Plavix that he was on from prior PCI/stroke). However, it's not totally clear what he did with the Coumadin as it was no longer on his list when he saw a cardiologist in the [place] system at the end of June. The patient thinks his cardiologist took him off it but the note doesn't reflect that (the patient does have some cognitive difficulties since his stroke and lives with his son who manages his medicine). He went on to have ICD placement on 06/29/14. Reading their notes, it appears they were not aware of his 03/2014 diagnosis of atrial fibrillation until it was picked up on his ICD  interrogation in early September at which time he was placed on Edoxaban.   Admitted 07/16/14 with increased dyspnea. Found to be in atrial fibrillation with RVR. Had TEE/DC-CV. TEE showed EF 25%, RV mildly dilated and moderately to severely dysfunctional.  Successful cardioversion completed. He required IV lasix and short term milrinone. He transitioned to torsemide 40 mg twice a day. He remained on edoxaban. He was discharged on amiodarone 400 mg daily, carvedilol 6.25 mg twice a day, and hydralazine 50 mg tid/imdur 60 mg daily. Discharged weight was 204 pounds.   Cardiolite in 2/16 showed EF 31%, no ischemia.    He was admitted in 3/16 with acute bronchitis, hypercalcemia, and AKI with creatinine up to 5.5.   Bone marrow biopsy in 4/16 showed no significant abnormalities.     He seemed to do well until 7/16 when he had a cardiac arrest [place] (apparently PEA, had CPR and epinephrine).  He was admitted to [facility].  Coronary angiography was done, and he had PTCA to proximal LAD in-stent restenosis.  The RCA had a chronic total occlusion.  He developed AKI and progressed to ESRD, now on HD.  He went to SNF initially after hospitalization, now is home with his son.    He presents today for add on.  Cardiac rehab called and stated he appeared to be back in Afib 12/14/15 with EKG showing a fib controlled rate in 80-90s. At last visit switched to losartan with chronic cough and requested HF take off more fluid with volume overload.  Graduates  from Cardiac rehab this week, transitions to maintenance program. Overall feels ok. He does not feel any different in Afib. DOE stable walking up steps, can walk a good distance on flat ground. (8 laps at cardiac rehab).  Chronic 2 pillow orthopnea.  No chest pain. No melena/BRBPR, continues on warfarin.  He has a chronic dry cough.   EKG 12/18/15 Afib 70s  ECHO 03/2014 EF 25-30%  ECHO 8/16 EF 15%, mild LV dilation, mild-moderate MR, severely decreased RV  systolic function, PASP 63 mmHg.   Labs  07/21/14 TSH 0.413  10/15 K 3.5 => 4.3 Creatinine 3.86 => 3.96, Na 137  3/16 TSH normal 4/16 K 3.8, creatinine 4.62, HCT 28.8, AST/ALT normal 7/16 HCT 30 8/16 AST/ALT normal, TSH normal 10/16 HCT 32.5 12/16 AST/ALT normal, tbili 1.9, TSH normal  ROS: All systems negative except as listed in HPI, PMH and Problem List.  Past Medical History  Diagnosis Date  . Systolic and diastolic CHF, chronic ([place])     a. Echo at [facility] 11/17/2013 Ef <25%, severe hypokinesis of LV, moderately dilated LA, grade IV/IV diastolic dysfunction with irreversible restrictive pattern of mitral inflow, severe pulm HTN (RVSP >67mHg), 1-2+ MR  b. [facility] EF 25-30%, moderate concentric LVH, diffuse hypokinesis, moderately dilated LA, no significant MR observed  . CAD (coronary artery disease)     a. PCI LAD/RCA in 2007  b. [facility]. PCI LAD 08/2013, CTO of RCA and nonobstructive disease in the LCx.  .Marland KitchenHyperlipidemia   . Dementia   . Ischemic cardiomyopathy     a. s/p Biotronik ICD 06/2014.  .Marland KitchenRetinopathy     bilateral  . HTN (hypertension)   . Abnormal renal ultrasound     a. 03/2014: Abnormal renal ultrasound with left renal lesion and bladder wall thickening and microscopic hematuria. Multiple cysts on prior MRI. Given inability to use contrast, f/u imaging limited, repeat [place] months with outpatient uro f/u.  .Marland KitchenParoxysmal atrial fibrillation (HCC)   . Paroxysmal atrial flutter ([facility]   . Anemia   . Myocardial infarction ([facility] July 2016    Heart attack  . Peripheral vascular disease ([facility]   . Stroke ([facility]     right arm weakness  . Asthma   . CKD (chronic kidney disease), stage IV ([facility]     on dialysis T/TH/Sa  . 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 H/O cardiac arrest 04/2015  . Constipation   . AICD (automatic cardioverter/defibrillator) present   . Pneumonia     when he was younger    Current  Outpatient Prescriptions  Medication Sig Dispense Refill  . acetaminophen (TYLENOL) 325 MG tablet Take 650 mg by mouth every 8 (eight) hours as needed for headache (pain).     .Marland Kitchenalbuterol (PROVENTIL HFA;VENTOLIN HFA) 108 (90 BASE) MCG/ACT inhaler Inhale 2 puffs into the lungs every 6 (six) hours as needed for wheezing or shortness of breath.    .Marland Kitchenalbuterol (PROVENTIL) (2.5 MG/3ML) 0.083% nebulizer solution Take 2.5 mg by nebulization every 6 (six) hours as needed for wheezing or shortness of breath.    .Marland Kitchenamiodarone (PACERONE) 100 MG tablet Take 100 mg by mouth daily. for AFIB    . atorvastatin (LIPITOR) 40 MG tablet Take 1 tablet (40 mg total) by mouth daily. 30 tablet 1  . carvedilol (COREG) 6.25 MG tablet Take 1.5 tablets (9.375 mg total) by mouth 2 (two) times  daily with a meal. 45 tablet 6  . clopidogrel (PLAVIX) 75 MG tablet TAKE ONE TABLET BY MOUTH ONCE DAILY 30 tablet 0  . docusate sodium (COLACE) 100 MG capsule Take 100 mg by mouth daily as needed (constipation).     . enoxaparin (LOVENOX) 80 MG/0.8ML injection Inject 0.8 mLs (80 mg total) into the skin daily. 10 Syringe 0  . insulin lispro (HUMALOG) 100 UNIT/ML injection Inject 10 Units into the skin 2 (two) times daily as needed for high blood sugar (CBG >180).     Marland Kitchen loratadine (CLARITIN) 10 MG tablet Take 10 mg by mouth daily as needed for allergies.     Marland Kitchen losartan (COZAAR) 25 MG tablet Take 1 tablet (25 mg total) by mouth daily. 90 tablet 3  . mometasone-formoterol (DULERA) 200-5 MCG/ACT AERO Inhale 2 puffs into the lungs 2 (two) times daily as needed for wheezing or shortness of breath.    . Multiple Vitamin (MULTIVITAMIN WITH MINERALS) TABS tablet Take 1 tablet by mouth daily.    . nitroGLYCERIN (NITROSTAT) 0.4 MG SL tablet Place 1 tablet (0.4 mg total) under the tongue every 5 (five) minutes as needed for chest pain. 30 tablet 1  . warfarin (COUMADIN) 5 MG tablet Take 1 tablet (5 mg total) by mouth as directed. (Patient taking  differently: Take 2.5-5 mg by mouth daily. Take 1 tablet (5 mg) by mouth on Monday, Wednesday and Friday, take 1/2 tablet (2.5 mg) on Sunday, Tuesday, Thursday, Saturday) 45 tablet 3   No current facility-administered medications for this encounter.   Facility-Administered Medications Ordered in Other Encounters  Medication Dose Route Frequency Provider Last Rate Last Dose  . 0.9 %  sodium chloride infusion   Intravenous Continuous Angelia Mould, MD      . Chlorhexidine Gluconate Cloth 2 % PADS 6 each  6 each Topical Once Angelia Mould, MD      . insulin aspart (novoLOG) injection 10 Units  10 Units Intravenous Once Angelia Mould, MD       PHYSICAL EXAM: Filed Vitals:   12/18/15 0904  BP: 96/62  Pulse: 69  Weight: 181 lb (82.101 kg)  SpO2: 98%    Wt Readings from Last 3 Encounters:  12/18/15 181 lb (82.101 kg)  11/19/15 180 lb (81.647 kg)  11/16/15 175 lb 12 oz (79.72 kg)    General:  NAD HEENT: normal Neck: supple. JVP ~10 cm. Carotids 2+ bilaterally; no bruits. No thyromegaly or nodule noted.  Cor: PMI normal. Irregularly irregular. No rubs, gallops. 2/6 HSM LLSB.   Lungs: CTAB Abdomen: soft, NT, mild distention, no HSM. No bruits or masses. +BS  Extremities: no cyanosis, clubbing, rash.  Trace ankle edema.   Neuro: alert & orientedx3, cranial nerves grossly intact. Moves all 4 extremities w/o difficulty. Affect pleasant.  ASSESSMENT & PLAN: 1. Chronic systolic HF: Ischemic cardiomyopathy. Has Biotronik ICD. ECHO 11/29/15 EF 15-20%, severe RV systolic function. -  NYHA class II symptoms.  He has mild volume overload on exam.  Due for dialysis today.  - ESRD and getting HD.  - Continue Coreg 9.375 mg BID. No room to up-titrate with soft BP.   - Continue losartan 25 mg daily.    - Echo to reassess LV function.   2. Atrial fibrillation: Paroxysmal.  In 9/15, had TEE with no LAA thrombus and successful DC-CV.   - ECG today shows Afib with a rate of 70 bpm.   Will set up for TEE/DCCV with Dr Aundra Dubin next  week as he has recently had a sub therapeutic INR (11/26/15 INR = 1.4) - Check INR today to send to [facility] clinic and get an appointment ASAP.  - Continue amiodarone 100 mg daily - Recent transaminases and TSH normal. He will need regular eye exams on amiodarone. - Continue warfarin, aim for INR 2-2.5 while on Plavix.  3. CAD: Cardiac arrest 7/16 with cath and cutting balloon angioplasty to proximal LAD in-stent restenosis.   - Continue Plavix/warfarin for 1 year as long as he tolerates then likely stop Plavix in 7/17. - Continue lipitor 40 mg daily. Lipids stable 10/2015 4. ESRD:  - HD dependent. Recently requested to have more fluid taken off due to appearance of volume overload on exam.   5. HTN - Managed in the setting of HF medications. Continue current meds as above.  Too soft to up-titrate today. 6. Pancreatic fullness/mass:   - Noted on [place] 11/02/15. Normal pancreas without mass by CT 11/21/15.  Will schedule for TEE/DCCV with Dr Aundra Dubin, prefers M/W/F (Non dialysis days). Will see back in 2 week to make sure he is still in rhythm. INR today and follow up ASAP with [facility] coumadin clinic.   Shirley Friar, PA-C 12/18/2015

## 2015-12-18 ENCOUNTER — Encounter (HOSPITAL_COMMUNITY): Payer: Self-pay | Admitting: Cardiology

## 2015-12-18 ENCOUNTER — Ambulatory Visit (HOSPITAL_COMMUNITY)
Admission: RE | Admit: 2015-12-18 | Discharge: 2015-12-18 | Disposition: A | Discharge status: Home / Self Care | Payer: Medicare HMO | Source: Ambulatory Visit | Attending: Cardiology | Admitting: Cardiology

## 2015-12-18 VITALS — BP 96/62 | HR 69 | Wt 181.0 lb

## 2015-12-18 DIAGNOSIS — Z7901 Long term (current) use of anticoagulants: Secondary | ICD-10-CM | POA: Diagnosis not present

## 2015-12-18 DIAGNOSIS — Z992 Dependence on renal dialysis: Secondary | ICD-10-CM | POA: Diagnosis not present

## 2015-12-18 DIAGNOSIS — Z8674 Personal history of sudden cardiac arrest: Secondary | ICD-10-CM | POA: Diagnosis not present

## 2015-12-18 DIAGNOSIS — Z955 Presence of coronary angioplasty implant and graft: Secondary | ICD-10-CM | POA: Insufficient documentation

## 2015-12-18 DIAGNOSIS — I10 Essential (primary) hypertension: Secondary | ICD-10-CM

## 2015-12-18 DIAGNOSIS — I252 Old myocardial infarction: Secondary | ICD-10-CM | POA: Diagnosis not present

## 2015-12-18 DIAGNOSIS — Z79899 Other long term (current) drug therapy: Secondary | ICD-10-CM | POA: Diagnosis not present

## 2015-12-18 DIAGNOSIS — F0390 Unspecified dementia without behavioral disturbance: Secondary | ICD-10-CM | POA: Insufficient documentation

## 2015-12-18 DIAGNOSIS — I480 Paroxysmal atrial fibrillation: Secondary | ICD-10-CM | POA: Diagnosis not present

## 2015-12-18 DIAGNOSIS — I4891 Unspecified atrial fibrillation: Secondary | ICD-10-CM

## 2015-12-18 DIAGNOSIS — E1122 Type 2 diabetes mellitus with diabetic chronic kidney disease: Secondary | ICD-10-CM | POA: Insufficient documentation

## 2015-12-18 DIAGNOSIS — I2510 Atherosclerotic heart disease of native coronary artery without angina pectoris: Secondary | ICD-10-CM | POA: Diagnosis not present

## 2015-12-18 DIAGNOSIS — N185 Chronic kidney disease, stage 5: Secondary | ICD-10-CM

## 2015-12-18 DIAGNOSIS — J45909 Unspecified asthma, uncomplicated: Secondary | ICD-10-CM | POA: Insufficient documentation

## 2015-12-18 DIAGNOSIS — N186 End stage renal disease: Secondary | ICD-10-CM | POA: Diagnosis not present

## 2015-12-18 DIAGNOSIS — I255 Ischemic cardiomyopathy: Secondary | ICD-10-CM | POA: Insufficient documentation

## 2015-12-18 DIAGNOSIS — E785 Hyperlipidemia, unspecified: Secondary | ICD-10-CM | POA: Insufficient documentation

## 2015-12-18 DIAGNOSIS — Z95810 Presence of automatic (implantable) cardiac defibrillator: Secondary | ICD-10-CM | POA: Diagnosis not present

## 2015-12-18 DIAGNOSIS — I5022 Chronic systolic (congestive) heart failure: Secondary | ICD-10-CM | POA: Diagnosis not present

## 2015-12-18 DIAGNOSIS — I132 Hypertensive heart and chronic kidney disease with heart failure and with stage 5 chronic kidney disease, or end stage renal disease: Secondary | ICD-10-CM | POA: Insufficient documentation

## 2015-12-18 DIAGNOSIS — Z794 Long term (current) use of insulin: Secondary | ICD-10-CM | POA: Insufficient documentation

## 2015-12-18 DIAGNOSIS — Z8673 Personal history of transient ischemic attack (TIA), and cerebral infarction without residual deficits: Secondary | ICD-10-CM | POA: Diagnosis not present

## 2015-12-18 LAB — PROTIME-INR
INR: 2.02 — ABNORMAL HIGH (ref 0.00–1.49)
Prothrombin Time: 22.7 seconds — ABNORMAL HIGH (ref 11.6–15.2)

## 2015-12-18 LAB — POCT INR: INR: 2.02

## 2015-12-18 NOTE — Patient Instructions (Signed)
Your physician has requested that you have a TEE/Cardioversion. During a TEE, sound waves are used to create images of your heart. It provides your doctor with information about the size and shape of your heart and how well your heart's chambers and valves are working. In this test, a transducer is attached to the end of a flexible tube that is guided down you throat and into your esophagus (the tube leading from your mouth to your stomach) to get a more detailed image of your heart. Once the TEE has determined that a blood clot is not present, the cardioversion begins. Electrical Cardioversion uses a jolt of electricity to your heart either through paddles or wired patches attached to your chest. This is a controlled, usually prescheduled, procedure. This procedure is done at the hospital and you are not awake during the procedure. You usually go home the day of the procedure. Please see the instruction sheet given to you today for more information.  Your physician wants you to follow-up in the coumadin clinic  Your physician recommends that you schedule a follow-up appointment in: 2 weeks In the [facility]

## 2015-12-18 NOTE — Progress Notes (Signed)
Advanced Heart Failure Medication Review by a Pharmacist  Does the patient  feel that his/her medications are working for him/her?  yes  Has the patient been experiencing any side effects to the medications prescribed?  no  Does the patient measure his/her own blood pressure or blood glucose at home?  no   Does the patient have any problems obtaining medications due to transportation or finances?   no  Understanding of regimen: fair Understanding of indications: fair Potential of compliance: fair Patient understands to avoid NSAIDs. Patient understands to avoid decongestants.  Issues to address at subsequent visits: Compliance   Pharmacist comments:  Tony Freeman is a 65 yo M presenting with his son and grandson but without a medication list. He reports fair compliance but did not have a good understanding of his regimen. He also missed his last Coumadin clinic appt and states that he is no longer taking lovenox. I discussed the importance of compliance with these visits and the reasoning behind the use of Coumadin for Afib. He verbalized understanding and his son stated he would make an appointment ASAP. He was also unsure whether or not he was taking losartan so I called [facility] to verify that it was last filled on 1/20 for 90 days. Also verified that amiodarone 200 mg daily was filled on 1/17 for 30 days. Again, discussed the importance of consistent use of his medications.   Ruta Hinds. Velva Harman, PharmD, BCPS, CPP Clinical Pharmacist Pager: [phone] Phone: [phone] 12/18/2015 9:26 AM   Time with patient: 10 minutes Preparation and documentation time: 4 minutes Total time: 14 minutes

## 2015-12-19 ENCOUNTER — Encounter (HOSPITAL_COMMUNITY)
Admission: RE | Admit: 2015-12-19 | Discharge: 2015-12-19 | Disposition: A | Discharge status: Home / Self Care | Payer: Medicare HMO | Source: Ambulatory Visit | Attending: Cardiology | Admitting: Cardiology

## 2015-12-19 ENCOUNTER — Other Ambulatory Visit (HOSPITAL_COMMUNITY): Payer: Self-pay

## 2015-12-19 ENCOUNTER — Ambulatory Visit (INDEPENDENT_AMBULATORY_CARE_PROVIDER_SITE_OTHER): Payer: Medicare HMO | Admitting: *Deleted

## 2015-12-19 DIAGNOSIS — I4891 Unspecified atrial fibrillation: Secondary | ICD-10-CM | POA: Diagnosis not present

## 2015-12-19 DIAGNOSIS — Z5181 Encounter for therapeutic drug level monitoring: Secondary | ICD-10-CM

## 2015-12-19 DIAGNOSIS — I480 Paroxysmal atrial fibrillation: Secondary | ICD-10-CM

## 2015-12-19 DIAGNOSIS — Z9861 Coronary angioplasty status: Secondary | ICD-10-CM | POA: Diagnosis not present

## 2015-12-19 LAB — POCT INR: INR: 1.8

## 2015-12-19 LAB — GLUCOSE, CAPILLARY: GLUCOSE-CAPILLARY: 137 mg/dL — AB (ref 65–99)

## 2015-12-20 ENCOUNTER — Telehealth (HOSPITAL_COMMUNITY): Payer: Self-pay | Admitting: Cardiology

## 2015-12-20 ENCOUNTER — Encounter: Payer: Self-pay | Admitting: Cardiology

## 2015-12-20 DIAGNOSIS — Z5181 Encounter for therapeutic drug level monitoring: Secondary | ICD-10-CM

## 2015-12-20 DIAGNOSIS — I4891 Unspecified atrial fibrillation: Secondary | ICD-10-CM

## 2015-12-20 NOTE — Progress Notes (Signed)
This encounter was created in error - please disregard.

## 2015-12-20 NOTE — Telephone Encounter (Signed)
Per Andris Baumann, MD  Patient will need coumadin clinic to address sub therapeutic inr done 2/21 Cancel TEE Recheck INR on 2/27 and possibly reschedule TEE/DCCV for 3/2   Detailed message left for coumadin clinic 2/23 @ 1250

## 2015-12-20 NOTE — Telephone Encounter (Signed)
INR from 2/21 looks like it was therapeutic...however pt came to Coumadin clinic for his scheduled appt on 2/22 and was low at 1.8 - his reading was already addressed in anticoag note from yesterday. His Coumadin dose was boosted that day and we will recheck his INR next Wednesday, 3/1. We will contact you after that appt and let you know INR reading to confirm whether or not you want TEE/DCCV on 3/2.

## 2015-12-20 NOTE — Telephone Encounter (Signed)
Spoke with Tony Freeman in HF clinic - they have scheduled TEE/DCCV for 3/2 and ok for pt to have INR checked with [place] on 3/1.

## 2015-12-21 ENCOUNTER — Ambulatory Visit (HOSPITAL_COMMUNITY): Admission: RE | Admit: 2015-12-21 | Payer: Medicare HMO | Source: Ambulatory Visit | Admitting: Cardiology

## 2015-12-21 ENCOUNTER — Encounter (HOSPITAL_COMMUNITY): Payer: Medicare HMO

## 2015-12-21 ENCOUNTER — Encounter (HOSPITAL_COMMUNITY): Admission: RE | Payer: Self-pay | Source: Ambulatory Visit

## 2015-12-21 SURGERY — ECHOCARDIOGRAM, TRANSESOPHAGEAL
Anesthesia: Monitor Anesthesia Care

## 2015-12-24 ENCOUNTER — Telehealth: Payer: Self-pay

## 2015-12-24 ENCOUNTER — Telehealth (HOSPITAL_COMMUNITY): Payer: Self-pay | Admitting: *Deleted

## 2015-12-24 ENCOUNTER — Encounter (HOSPITAL_COMMUNITY): Admission: RE | Admit: 2015-12-24 | Payer: Medicare HMO | Source: Ambulatory Visit

## 2015-12-24 DIAGNOSIS — I4891 Unspecified atrial fibrillation: Secondary | ICD-10-CM

## 2015-12-24 DIAGNOSIS — Z5181 Encounter for therapeutic drug level monitoring: Secondary | ICD-10-CM

## 2015-12-24 NOTE — Telephone Encounter (Signed)
-----   Message from Larey Dresser, MD sent at 12/24/2015  3:47 PM EST ----- Regarding: RE: surgery rescheduled/ need to hold Coumadin Ideally should be 1 month after his cardioversion before we hold coumadin, would recommend moving to early April.  ----- Message -----    From: Denman George, RN    Sent: 12/24/2015  11:37 AM      To: Larey Dresser, MD Subject: surgery rescheduled/ need to hold Coumadin     Dr. Aundra Dubin- this pt's surgery for Creation Right Arm AVF vs BVT vs AVG has been rescheduled to 01/11/16.  We have advised him to hold his Coumadin 5 days prior to surgery; last dose to be taken 01/05/16.  Will you be bridging him with Lovenox?

## 2015-12-24 NOTE — Progress Notes (Signed)
This encounter was created in error - please disregard.

## 2015-12-25 ENCOUNTER — Telehealth (HOSPITAL_COMMUNITY): Payer: Self-pay | Admitting: *Deleted

## 2015-12-25 ENCOUNTER — Other Ambulatory Visit (HOSPITAL_COMMUNITY): Payer: Self-pay | Admitting: *Deleted

## 2015-12-25 ENCOUNTER — Encounter (HOSPITAL_COMMUNITY): Payer: Self-pay | Admitting: *Deleted

## 2015-12-25 DIAGNOSIS — I4891 Unspecified atrial fibrillation: Secondary | ICD-10-CM

## 2015-12-25 NOTE — Telephone Encounter (Signed)
Pt unable to have TEE/DCCV on 3/2, sch pt for Wed 3/8 at 11 am, spoke w/son he is aware, reviewed instructions with him as well

## 2015-12-26 ENCOUNTER — Encounter (HOSPITAL_COMMUNITY): Admission: RE | Admit: 2015-12-26 | Payer: Medicare HMO | Source: Ambulatory Visit

## 2015-12-26 ENCOUNTER — Telehealth (HOSPITAL_COMMUNITY): Payer: Self-pay | Admitting: *Deleted

## 2015-12-26 ENCOUNTER — Ambulatory Visit (INDEPENDENT_AMBULATORY_CARE_PROVIDER_SITE_OTHER): Payer: Medicare HMO | Admitting: *Deleted

## 2015-12-26 DIAGNOSIS — I4891 Unspecified atrial fibrillation: Secondary | ICD-10-CM | POA: Diagnosis not present

## 2015-12-26 DIAGNOSIS — Z5181 Encounter for therapeutic drug level monitoring: Secondary | ICD-10-CM

## 2015-12-26 LAB — POCT INR: INR: 2.2

## 2015-12-26 NOTE — Telephone Encounter (Signed)
Pre cert for TEE approval # [id number]

## 2015-12-28 ENCOUNTER — Encounter (HOSPITAL_COMMUNITY): Payer: Medicare HMO

## 2015-12-31 ENCOUNTER — Encounter (HOSPITAL_COMMUNITY): Payer: Medicare HMO

## 2015-12-31 NOTE — Telephone Encounter (Signed)
Contacted pt. Per phone.  Discussed rescheduling surgery for Right Arm AVF one month post cardioversion, per recommendation Dr. Aundra Dubin.  Scheduled right arm AVF creation for 02/12/16.  Advised pt. To hold Coumadin 5 days prior to surgery date of 4/18; instructed to take last dose on 02/06/16, then hold.  Will make Dr. Aundra Dubin aware of new surgery date, and of holding Coumadin after 02/06/16.  Pt. Verb. Understanding.

## 2015-12-31 NOTE — Telephone Encounter (Signed)
With ESRD, Lovenox bridging is difficult.  I would not bridge, just minimize time off warfarin.

## 2016-01-01 ENCOUNTER — Ambulatory Visit (INDEPENDENT_AMBULATORY_CARE_PROVIDER_SITE_OTHER): Payer: Medicare HMO | Admitting: *Deleted

## 2016-01-01 ENCOUNTER — Telehealth (HOSPITAL_COMMUNITY): Payer: Self-pay | Admitting: *Deleted

## 2016-01-01 DIAGNOSIS — Z5181 Encounter for therapeutic drug level monitoring: Secondary | ICD-10-CM

## 2016-01-01 DIAGNOSIS — I4891 Unspecified atrial fibrillation: Secondary | ICD-10-CM

## 2016-01-01 LAB — POCT INR: INR: 3.6

## 2016-01-01 NOTE — Anesthesia Preprocedure Evaluation (Addendum)
Anesthesia Evaluation  Patient identified by MRN, date of birth, ID band Patient awake    Reviewed: Allergy & Precautions, H&P , NPO status , Patient's Chart, lab work & pertinent test results  History of Anesthesia Complications Negative for: history of anesthetic complications  Airway Mallampati: II  TM Distance: >3 FB Neck ROM: full    Dental no notable dental hx. (+) Dental Advisory Given   Pulmonary asthma ,    Pulmonary exam normal breath sounds clear to auscultation       Cardiovascular hypertension, Pt. on medications + CAD, + Past MI (04/2015), + Cardiac Stents, + Peripheral Vascular Disease and +CHF  Normal cardiovascular exam+ dysrhythmias Atrial Fibrillation + Cardiac Defibrillator  Rhythm:regular Rate:Normal  Severe ischemic cardiomyopathy, ECHO 11/2015 EF 15-20%, Multiple stents, Cardiac arrest 04/2015, AF, Grade 4 diastolic dysfunction   Neuro/Psych CVA    GI/Hepatic negative GI ROS, Neg liver ROS,   Endo/Other  negative endocrine ROSdiabetes, Insulin Dependent  Renal/GU DialysisRenal diseasenegative Renal ROS     Musculoskeletal   Abdominal   Peds  Hematology  (+) anemia ,   Anesthesia Other Findings   Reproductive/Obstetrics negative OB ROS                           Anesthesia Physical Anesthesia Plan  ASA: IV  Anesthesia Plan: MAC   Post-op Pain Management:    Induction: Intravenous  Airway Management Planned: Nasal Cannula  Additional Equipment:   Intra-op Plan:   Post-operative Plan:   Informed Consent: I have reviewed the patients History and Physical, chart, labs and discussed the procedure including the risks, benefits and alternatives for the proposed anesthesia with the patient or authorized representative who has indicated his/her understanding and acceptance.     Plan Discussed with:   Anesthesia Plan Comments: (ASA IV++, Had dialysis fistula with  2mg  versed, 5mcg fent and .22mcg/kg precedex,  Would use generous topical, viscous lidocaine pre procedure, Ketamine, low dose versed, hold narcotics, check labs to be drawn, Etomidate for CV)       Anesthesia Quick Evaluation

## 2016-01-01 NOTE — Telephone Encounter (Signed)
Tony Freeman with Coumadin clinic called and stated pts INR was 3.6. I forwarded the call to heather schub who spoke with her about holding coumadin for procedure.  Tony Freeman called back and stated pt had taken his coumadin today but will hold tomorrow also patient has been taking augmentin 875mg  since 12/25/15 for upper respiratory infection.  I took the message and relayed to heather. Nira Conn will call sharon at [phone] if she needs to make any adjustments or needs further information

## 2016-01-02 ENCOUNTER — Encounter (HOSPITAL_COMMUNITY)
Admission: RE | Disposition: A | Discharge status: Home / Self Care | Payer: Self-pay | Source: Ambulatory Visit | Attending: Cardiology

## 2016-01-02 ENCOUNTER — Ambulatory Visit (HOSPITAL_BASED_OUTPATIENT_CLINIC_OR_DEPARTMENT_OTHER)
Admission: RE | Admit: 2016-01-02 | Discharge: 2016-01-02 | Disposition: A | Discharge status: Home / Self Care | Payer: Medicare HMO | Source: Ambulatory Visit | Admitting: Cardiology

## 2016-01-02 ENCOUNTER — Ambulatory Visit (HOSPITAL_COMMUNITY): Payer: Medicare HMO | Admitting: Anesthesiology

## 2016-01-02 ENCOUNTER — Encounter (HOSPITAL_COMMUNITY): Payer: Medicare HMO

## 2016-01-02 ENCOUNTER — Encounter (HOSPITAL_COMMUNITY): Payer: Self-pay | Admitting: *Deleted

## 2016-01-02 ENCOUNTER — Ambulatory Visit (HOSPITAL_COMMUNITY)
Admission: RE | Admit: 2016-01-02 | Discharge: 2016-01-02 | Disposition: A | Discharge status: Home / Self Care | Payer: Medicare HMO | Source: Ambulatory Visit | Attending: Cardiology | Admitting: Cardiology

## 2016-01-02 DIAGNOSIS — Z95810 Presence of automatic (implantable) cardiac defibrillator: Secondary | ICD-10-CM | POA: Diagnosis not present

## 2016-01-02 DIAGNOSIS — Z79899 Other long term (current) drug therapy: Secondary | ICD-10-CM | POA: Insufficient documentation

## 2016-01-02 DIAGNOSIS — Z8673 Personal history of transient ischemic attack (TIA), and cerebral infarction without residual deficits: Secondary | ICD-10-CM | POA: Insufficient documentation

## 2016-01-02 DIAGNOSIS — Z992 Dependence on renal dialysis: Secondary | ICD-10-CM | POA: Insufficient documentation

## 2016-01-02 DIAGNOSIS — I132 Hypertensive heart and chronic kidney disease with heart failure and with stage 5 chronic kidney disease, or end stage renal disease: Secondary | ICD-10-CM | POA: Insufficient documentation

## 2016-01-02 DIAGNOSIS — I2510 Atherosclerotic heart disease of native coronary artery without angina pectoris: Secondary | ICD-10-CM | POA: Diagnosis not present

## 2016-01-02 DIAGNOSIS — I480 Paroxysmal atrial fibrillation: Secondary | ICD-10-CM | POA: Diagnosis not present

## 2016-01-02 DIAGNOSIS — J45909 Unspecified asthma, uncomplicated: Secondary | ICD-10-CM | POA: Diagnosis not present

## 2016-01-02 DIAGNOSIS — F0390 Unspecified dementia without behavioral disturbance: Secondary | ICD-10-CM | POA: Insufficient documentation

## 2016-01-02 DIAGNOSIS — Z7901 Long term (current) use of anticoagulants: Secondary | ICD-10-CM | POA: Diagnosis not present

## 2016-01-02 DIAGNOSIS — Z7902 Long term (current) use of antithrombotics/antiplatelets: Secondary | ICD-10-CM | POA: Diagnosis not present

## 2016-01-02 DIAGNOSIS — E11319 Type 2 diabetes mellitus with unspecified diabetic retinopathy without macular edema: Secondary | ICD-10-CM | POA: Insufficient documentation

## 2016-01-02 DIAGNOSIS — N186 End stage renal disease: Secondary | ICD-10-CM | POA: Insufficient documentation

## 2016-01-02 DIAGNOSIS — Z794 Long term (current) use of insulin: Secondary | ICD-10-CM | POA: Diagnosis not present

## 2016-01-02 DIAGNOSIS — I4892 Unspecified atrial flutter: Secondary | ICD-10-CM | POA: Diagnosis not present

## 2016-01-02 DIAGNOSIS — E1151 Type 2 diabetes mellitus with diabetic peripheral angiopathy without gangrene: Secondary | ICD-10-CM | POA: Insufficient documentation

## 2016-01-02 DIAGNOSIS — E785 Hyperlipidemia, unspecified: Secondary | ICD-10-CM | POA: Diagnosis not present

## 2016-01-02 DIAGNOSIS — I252 Old myocardial infarction: Secondary | ICD-10-CM | POA: Insufficient documentation

## 2016-01-02 DIAGNOSIS — E1122 Type 2 diabetes mellitus with diabetic chronic kidney disease: Secondary | ICD-10-CM | POA: Diagnosis not present

## 2016-01-02 DIAGNOSIS — I4891 Unspecified atrial fibrillation: Secondary | ICD-10-CM

## 2016-01-02 DIAGNOSIS — I5042 Chronic combined systolic (congestive) and diastolic (congestive) heart failure: Secondary | ICD-10-CM | POA: Diagnosis not present

## 2016-01-02 DIAGNOSIS — Z8674 Personal history of sudden cardiac arrest: Secondary | ICD-10-CM | POA: Insufficient documentation

## 2016-01-02 DIAGNOSIS — Z955 Presence of coronary angioplasty implant and graft: Secondary | ICD-10-CM | POA: Insufficient documentation

## 2016-01-02 DIAGNOSIS — I255 Ischemic cardiomyopathy: Secondary | ICD-10-CM | POA: Diagnosis not present

## 2016-01-02 HISTORY — PX: TEE WITHOUT CARDIOVERSION: SHX5443

## 2016-01-02 HISTORY — PX: CARDIOVERSION: SHX1299

## 2016-01-02 LAB — POCT I-STAT 4, (NA,K, GLUC, HGB,HCT)
GLUCOSE: 80 mg/dL (ref 65–99)
HEMATOCRIT: 36 % — AB (ref 39.0–52.0)
Hemoglobin: 12.2 g/dL — ABNORMAL LOW (ref 13.0–17.0)
POTASSIUM: 5.2 mmol/L — AB (ref 3.5–5.1)
Sodium: 137 mmol/L (ref 135–145)

## 2016-01-02 SURGERY — ECHOCARDIOGRAM, TRANSESOPHAGEAL
Anesthesia: Monitor Anesthesia Care

## 2016-01-02 MED ORDER — PROPOFOL 500 MG/50ML IV EMUL
INTRAVENOUS | Status: DC | PRN
  Administered 2016-01-02: 25 ug/kg/min via INTRAVENOUS

## 2016-01-02 MED ORDER — FLUMAZENIL 0.5 MG/5ML IV SOLN
INTRAVENOUS | Status: AC
  Filled 2016-01-02: qty 5

## 2016-01-02 MED ORDER — KETAMINE HCL 10 MG/ML IJ SOLN
INTRAMUSCULAR | Status: DC | PRN
  Administered 2016-01-02 (×2): 5 mg via INTRAVENOUS
  Administered 2016-01-02 (×2): 10 mg via INTRAVENOUS

## 2016-01-02 MED ORDER — MIDAZOLAM HCL 5 MG/5ML IJ SOLN
INTRAMUSCULAR | Status: DC | PRN
  Administered 2016-01-02 (×2): 1 mg via INTRAVENOUS

## 2016-01-02 MED ORDER — PHENYLEPHRINE HCL 10 MG/ML IJ SOLN
INTRAMUSCULAR | Status: DC | PRN
  Administered 2016-01-02 (×2): 80 ug via INTRAVENOUS

## 2016-01-02 MED ORDER — PHENYLEPHRINE HCL 10 MG/ML IJ SOLN
10.0000 mg | INTRAVENOUS | Status: DC | PRN
  Administered 2016-01-02: 20 ug/min via INTRAVENOUS

## 2016-01-02 MED ORDER — SODIUM CHLORIDE 0.9 % IV SOLN
INTRAVENOUS | Status: DC
  Administered 2016-01-02: 11:00:00 via INTRAVENOUS

## 2016-01-02 MED ORDER — FLUMAZENIL 0.5 MG/5ML IV SOLN
INTRAVENOUS | Status: DC | PRN
  Administered 2016-01-02: 0.2 mg via INTRAVENOUS

## 2016-01-02 MED ORDER — BUTAMBEN-TETRACAINE-BENZOCAINE 2-2-14 % EX AERO
INHALATION_SPRAY | CUTANEOUS | Status: DC | PRN
  Administered 2016-01-02: 2 via TOPICAL

## 2016-01-02 MED ORDER — PROPOFOL 10 MG/ML IV BOLUS
INTRAVENOUS | Status: DC | PRN
  Administered 2016-01-02: 10 mg via INTRAVENOUS

## 2016-01-02 NOTE — CV Procedure (Signed)
Procedure: TEE  Indication: Atrial fibrillation  Sedation: Per anesthesiology.  Findings: Please see echo section for full report.  Mildly dilated LV with mild LV hypertrophy.  EF 15-20% with diffuse hypokinesis.  Moderately dilated RV with moderate to severely decreased systolic function.  D-shaped interventricular septum suggestive of RV pressure/volume overload.  Pacer wire in RV. Mild TR. Trivial MR.  Trileaflet aortic valve with no stenosis or regurgitation.  Moderate left atrial enlargement with no LA appendage thrombus (smoke noted).  Mildly dilated right atrium.  Grade III plaque in the descending thoracic aorta, normal caliber aorta.   May proceed to DCCV.   [name] 01/02/2016

## 2016-01-02 NOTE — Procedures (Signed)
Electrical Cardioversion Procedure Note Tony Freeman [id number] 12/11/1950  Procedure: Electrical Cardioversion Indications:  Atrial Fibrillation  Procedure Details Consent: Risks of procedure as well as the alternatives and risks of each were explained to the (patient/caregiver).  Consent for procedure obtained. Time Out: Verified patient identification, verified procedure, site/side was marked, verified correct patient position, special equipment/implants available, medications/allergies/relevent history reviewed, required imaging and test results available.  Performed  Patient placed on cardiac monitor, pulse oximetry, supplemental oxygen as necessary.  Sedation given: Per anesthesiology Pacer pads placed anterior and posterior chest.  Cardioverted 1 time(s).  Cardioverted at [place].  Evaluation Findings: Post procedure EKG shows: NSR Complications: None Patient did tolerate procedure well.   Tony Freeman 01/02/2016, 11:29 AM

## 2016-01-02 NOTE — Anesthesia Procedure Notes (Signed)
Procedure Name: MAC Date/Time: 01/02/2016 10:53 AM Performed by: Trixie Deis A Pre-anesthesia Checklist: Patient identified, Timeout performed, Emergency Drugs available, Suction available and Patient being monitored Patient Re-evaluated:Patient Re-evaluated prior to inductionOxygen Delivery Method: Nasal cannula Placement Confirmation: positive ETCO2 Dental Injury: Teeth and Oropharynx as per pre-operative assessment

## 2016-01-02 NOTE — Transfer of Care (Signed)
Immediate Anesthesia Transfer of Care Note  Patient: Tony Freeman  Procedure(s) Performed: Procedure(s): TRANSESOPHAGEAL ECHOCARDIOGRAM (TEE) (N/A) CARDIOVERSION (N/A)  Patient Location: Endoscopy Unit  Anesthesia Type:MAC  Level of Consciousness: awake, alert  and oriented  Airway & Oxygen Therapy: Patient Spontanous Breathing and Patient connected to nasal cannula oxygen  Post-op Assessment: Report given to RN, Post -op Vital signs reviewed and stable and Patient moving all extremities  Post vital signs: Reviewed and stable  Last Vitals:  Filed Vitals:   01/02/16 0931  BP: 116/85  Pulse: 79  Resp: 15    Complications: No apparent anesthesia complications

## 2016-01-02 NOTE — Discharge Instructions (Signed)

## 2016-01-02 NOTE — Anesthesia Postprocedure Evaluation (Signed)
Anesthesia Post Note  Patient: Tony Freeman  Procedure(s) Performed: Procedure(s) (LRB): TRANSESOPHAGEAL ECHOCARDIOGRAM (TEE) (N/A) CARDIOVERSION (N/A)  Patient location during evaluation: Endoscopy Anesthesia Type: MAC Level of consciousness: awake and alert Pain management: pain level controlled Vital Signs Assessment: post-procedure vital signs reviewed and stable Respiratory status: spontaneous breathing, nonlabored ventilation, respiratory function stable and patient connected to nasal cannula oxygen Cardiovascular status: stable and blood pressure returned to baseline Anesthetic complications: no    Last Vitals:  Filed Vitals:   01/02/16 1210 01/02/16 1211  BP:    Pulse: 62 62  Temp:    Resp: 17 17    Last Pain: There were no vitals filed for this visit.               Nomar Broad

## 2016-01-02 NOTE — Interval H&P Note (Signed)
History and Physical Interval Note:  01/02/2016 11:15 AM  Tony Freeman  has presented today for surgery, with the diagnosis of A FIB  The various methods of treatment have been discussed with the patient and family. After consideration of risks, benefits and other options for treatment, the patient has consented to  Procedure(s): TRANSESOPHAGEAL ECHOCARDIOGRAM (TEE) (N/A) CARDIOVERSION (N/A) as a surgical intervention .  The patient's history has been reviewed, patient examined, no change in status, stable for surgery.  I have reviewed the patient's chart and labs.  Questions were answered to the patient's satisfaction.     Dillard Pascal [employer]

## 2016-01-02 NOTE — Progress Notes (Signed)
Pt recovering well. No complications. See vital signs. Pt alert oriented and back to baseline. Pt with O2 sat 96%-98% on RA. Dr. Tresa Moore by to see pt. Pt to be discharged when so gets here.

## 2016-01-02 NOTE — H&P (View-Only) (Signed)
Patient ID: Tony Freeman, male   DOB: Jun 24, 1951, 65 y.o.   MRN: [id number]    Advanced Heart Failure Clinic Note   Cardiology: Dr. Aundra Dubin PCP: Dr. Tillman Sers  HPI: Tony Freeman is a 65 year old man with history of atrial fibrillation, CAD (s/p multiple stents) chronic combined CHF (echo 04/02/2014 with LV EF: 25-30%, diffuse hypokinesis), ischemic cardiomyopathy s/p dual chamber Biotronik ICD placement 06/29/2014, DM2, ESRD, asthma, HLD, abnormal renal US 03/2014 (followed by urology).  He has a h/o PCI LAD/RCA in 2007, PCI LAD/RCA in 2009 at [facility], abnormal lexiscan stress test 06/2013 moderate reversible perfusion abnormality in anterior, septal and apical region, then PCI to LAD 08/2013. [facility] records indicate that coronary angio at that time also showed CTO of RCA and nonobstructive disease in the LCx. Echo at [facility] at least back to 01/2013 showed EF 35-40%, then <25% in 04/6194 with diastolic dysfunction and elevated RVSP. During his 03/2014 admission at [facility], he was diagnosed with new onset atrial fib-vs-flutter. Echo showed false tendon in LV apex, moderate LVH, EF 25-30%, diffuse HK, mod LAE, trivial TR but no mention of pulm HTN. He also had one troponin that was 0.11 and the rest were negative, felt due to CKD and intracranial etiology with some encephalopathy. He was discharged on Coumadin (along with his Plavix that he was on from prior PCI/stroke). However, it's not totally clear what he did with the Coumadin as it was no longer on his list when he saw a cardiologist in the [place] system at the end of June. The patient thinks his cardiologist took him off it but the note doesn't reflect that (the patient does have some cognitive difficulties since his stroke and lives with his son who manages his medicine). He went on to have ICD placement on 06/29/14. Reading their notes, it appears they were not aware of his 03/2014 diagnosis of atrial fibrillation until it was picked up on his ICD  interrogation in early September at which time he was placed on Edoxaban.   Admitted 07/16/14 with increased dyspnea. Found to be in atrial fibrillation with RVR. Had TEE/DC-CV. TEE showed EF 25%, RV mildly dilated and moderately to severely dysfunctional.  Successful cardioversion completed. He required IV lasix and short term milrinone. He transitioned to torsemide 40 mg twice a day. He remained on edoxaban. He was discharged on amiodarone 400 mg daily, carvedilol 6.25 mg twice a day, and hydralazine 50 mg tid/imdur 60 mg daily. Discharged weight was 204 pounds.   Cardiolite in 2/16 showed EF 31%, no ischemia.    He was admitted in 3/16 with acute bronchitis, hypercalcemia, and AKI with creatinine up to 5.5.   Bone marrow biopsy in 4/16 showed no significant abnormalities.     He seemed to do well until 7/16 when he had a cardiac arrest [place] (apparently PEA, had CPR and epinephrine).  He was admitted to [facility].  Coronary angiography was done, and he had PTCA to proximal LAD in-stent restenosis.  The RCA had a chronic total occlusion.  He developed AKI and progressed to ESRD, now on HD.  He went to SNF initially after hospitalization, now is home with his son.    He presents today for add on.  Cardiac rehab called and stated he appeared to be back in Afib 12/14/15 with EKG showing a fib controlled rate in 80-90s. At last visit switched to losartan with chronic cough and requested HF take off more fluid with volume overload.  Graduates  from Cardiac rehab this week, transitions to maintenance program. Overall feels ok. He does not feel any different in Afib. DOE stable walking up steps, can walk a good distance on flat ground. (8 laps at cardiac rehab).  Chronic 2 pillow orthopnea.  No chest pain. No melena/BRBPR, continues on warfarin.  He has a chronic dry cough.   EKG 12/18/15 Afib 70s  ECHO 03/2014 EF 25-30%  ECHO 8/16 EF 15%, mild LV dilation, mild-moderate MR, severely decreased RV  systolic function, PASP 63 mmHg.   Labs  07/21/14 TSH 0.413  10/15 K 3.5 => 4.3 Creatinine 3.86 => 3.96, Na 137  3/16 TSH normal 4/16 K 3.8, creatinine 4.62, HCT 28.8, AST/ALT normal 7/16 HCT 30 8/16 AST/ALT normal, TSH normal 10/16 HCT 32.5 12/16 AST/ALT normal, tbili 1.9, TSH normal  ROS: All systems negative except as listed in HPI, PMH and Problem List.  Past Medical History  Diagnosis Date  . Systolic and diastolic CHF, chronic ([place])     a. Echo at [facility] 11/17/2013 Ef <25%, severe hypokinesis of LV, moderately dilated LA, grade IV/IV diastolic dysfunction with irreversible restrictive pattern of mitral inflow, severe pulm HTN (RVSP >67mHg), 1-2+ MR  b. [facility] EF 25-30%, moderate concentric LVH, diffuse hypokinesis, moderately dilated LA, no significant MR observed  . CAD (coronary artery disease)     a. PCI LAD/RCA in 2007  b. [facility]. PCI LAD 08/2013, CTO of RCA and nonobstructive disease in the LCx.  .Marland KitchenHyperlipidemia   . Dementia   . Ischemic cardiomyopathy     a. s/p Biotronik ICD 06/2014.  .Marland KitchenRetinopathy     bilateral  . HTN (hypertension)   . Abnormal renal ultrasound     a. 03/2014: Abnormal renal ultrasound with left renal lesion and bladder wall thickening and microscopic hematuria. Multiple cysts on prior MRI. Given inability to use contrast, f/u imaging limited, repeat [place] with outpatient uro f/u.  .Marland KitchenParoxysmal atrial fibrillation (HCC)   . Paroxysmal atrial flutter ([facility]   . Anemia   . Myocardial infarction ([facility] July 2016    Heart attack  . Peripheral vascular disease ([facility]   . Stroke ([facility]     right arm weakness  . Asthma   . CKD (chronic kidney disease), stage IV ([facility]     on dialysis T/TH/Sa  . 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 H/O cardiac arrest 04/2015  . Constipation   . AICD (automatic cardioverter/defibrillator) present   . Pneumonia     when he was younger    Current  Outpatient Prescriptions  Medication Sig Dispense Refill  . acetaminophen (TYLENOL) 325 MG tablet Take 650 mg by mouth every 8 (eight) hours as needed for headache (pain).     .Marland Kitchenalbuterol (PROVENTIL HFA;VENTOLIN HFA) 108 (90 BASE) MCG/ACT inhaler Inhale 2 puffs into the lungs every 6 (six) hours as needed for wheezing or shortness of breath.    .Marland Kitchenalbuterol (PROVENTIL) (2.5 MG/3ML) 0.083% nebulizer solution Take 2.5 mg by nebulization every 6 (six) hours as needed for wheezing or shortness of breath.    .Marland Kitchenamiodarone (PACERONE) 100 MG tablet Take 100 mg by mouth daily. for AFIB    . atorvastatin (LIPITOR) 40 MG tablet Take 1 tablet (40 mg total) by mouth daily. 30 tablet 1  . carvedilol (COREG) 6.25 MG tablet Take 1.5 tablets (9.375 mg total) by mouth 2 (two) times  daily with a meal. 45 tablet 6  . clopidogrel (PLAVIX) 75 MG tablet TAKE ONE TABLET BY MOUTH ONCE DAILY 30 tablet 0  . docusate sodium (COLACE) 100 MG capsule Take 100 mg by mouth daily as needed (constipation).     . enoxaparin (LOVENOX) 80 MG/0.8ML injection Inject 0.8 mLs (80 mg total) into the skin daily. 10 Syringe 0  . insulin lispro (HUMALOG) 100 UNIT/ML injection Inject 10 Units into the skin 2 (two) times daily as needed for high blood sugar (CBG >180).     Marland Kitchen loratadine (CLARITIN) 10 MG tablet Take 10 mg by mouth daily as needed for allergies.     Marland Kitchen losartan (COZAAR) 25 MG tablet Take 1 tablet (25 mg total) by mouth daily. 90 tablet 3  . mometasone-formoterol (DULERA) 200-5 MCG/ACT AERO Inhale 2 puffs into the lungs 2 (two) times daily as needed for wheezing or shortness of breath.    . Multiple Vitamin (MULTIVITAMIN WITH MINERALS) TABS tablet Take 1 tablet by mouth daily.    . nitroGLYCERIN (NITROSTAT) 0.4 MG SL tablet Place 1 tablet (0.4 mg total) under the tongue every 5 (five) minutes as needed for chest pain. 30 tablet 1  . warfarin (COUMADIN) 5 MG tablet Take 1 tablet (5 mg total) by mouth as directed. (Patient taking  differently: Take 2.5-5 mg by mouth daily. Take 1 tablet (5 mg) by mouth on Monday, Wednesday and Friday, take 1/2 tablet (2.5 mg) on Sunday, Tuesday, Thursday, Saturday) 45 tablet 3   No current facility-administered medications for this encounter.   Facility-Administered Medications Ordered in Other Encounters  Medication Dose Route Frequency Provider Last Rate Last Dose  . 0.9 %  sodium chloride infusion   Intravenous Continuous Angelia Mould, MD      . Chlorhexidine Gluconate Cloth 2 % PADS 6 each  6 each Topical Once Angelia Mould, MD      . insulin aspart (novoLOG) injection 10 Units  10 Units Intravenous Once Angelia Mould, MD       PHYSICAL EXAM: Filed Vitals:   12/18/15 0904  BP: 96/62  Pulse: 69  Weight: 181 lb (82.101 kg)  SpO2: 98%    Wt Readings from Last 3 Encounters:  12/18/15 181 lb (82.101 kg)  11/19/15 180 lb (81.647 kg)  11/16/15 175 lb 12 oz (79.72 kg)    General:  NAD HEENT: normal Neck: supple. JVP ~10 cm. Carotids 2+ bilaterally; no bruits. No thyromegaly or nodule noted.  Cor: PMI normal. Irregularly irregular. No rubs, gallops. 2/6 HSM LLSB.   Lungs: CTAB Abdomen: soft, NT, mild distention, no HSM. No bruits or masses. +BS  Extremities: no cyanosis, clubbing, rash.  Trace ankle edema.   Neuro: alert & orientedx3, cranial nerves grossly intact. Moves all 4 extremities w/o difficulty. Affect pleasant.  ASSESSMENT & PLAN: 1. Chronic systolic HF: Ischemic cardiomyopathy. Has Biotronik ICD. ECHO 11/29/15 EF 15-20%, severe RV systolic function. -  NYHA class II symptoms.  He has mild volume overload on exam.  Due for dialysis today.  - ESRD and getting HD.  - Continue Coreg 9.375 mg BID. No room to up-titrate with soft BP.   - Continue losartan 25 mg daily.    - Echo to reassess LV function.   2. Atrial fibrillation: Paroxysmal.  In 9/15, had TEE with no LAA thrombus and successful DC-CV.   - ECG today shows Afib with a rate of 70 bpm.   Will set up for TEE/DCCV with Dr Aundra Dubin next  week as he has recently had a sub therapeutic INR (11/26/15 INR = 1.4) - Check INR today to send to [facility] clinic and get an appointment ASAP.  - Continue amiodarone 100 mg daily - Recent transaminases and TSH normal. He will need regular eye exams on amiodarone. - Continue warfarin, aim for INR 2-2.5 while on Plavix.  3. CAD: Cardiac arrest 7/16 with cath and cutting balloon angioplasty to proximal LAD in-stent restenosis.   - Continue Plavix/warfarin for 1 year as long as he tolerates then likely stop Plavix in 7/17. - Continue lipitor 40 mg daily. Lipids stable 10/2015 4. ESRD:  - HD dependent. Recently requested to have more fluid taken off due to appearance of volume overload on exam.   5. HTN - Managed in the setting of HF medications. Continue current meds as above.  Too soft to up-titrate today. 6. Pancreatic fullness/mass:   - Noted on [place] 11/02/15. Normal pancreas without mass by CT 11/21/15.  Will schedule for TEE/DCCV with Dr Aundra Dubin, prefers M/W/F (Non dialysis days). Will see back in 2 week to make sure he is still in rhythm. INR today and follow up ASAP with [facility] coumadin clinic.   Shirley Friar, PA-C 12/18/2015

## 2016-01-03 ENCOUNTER — Encounter (HOSPITAL_COMMUNITY): Payer: Self-pay | Admitting: Cardiology

## 2016-01-04 ENCOUNTER — Other Ambulatory Visit: Payer: Self-pay | Admitting: *Deleted

## 2016-01-04 ENCOUNTER — Encounter (HOSPITAL_COMMUNITY): Payer: Medicare HMO

## 2016-01-09 ENCOUNTER — Other Ambulatory Visit: Payer: Self-pay | Admitting: Adult Health

## 2016-01-09 ENCOUNTER — Ambulatory Visit (INDEPENDENT_AMBULATORY_CARE_PROVIDER_SITE_OTHER): Payer: Medicare HMO | Admitting: Surgery

## 2016-01-09 DIAGNOSIS — Z5181 Encounter for therapeutic drug level monitoring: Secondary | ICD-10-CM | POA: Diagnosis not present

## 2016-01-09 DIAGNOSIS — I4891 Unspecified atrial fibrillation: Secondary | ICD-10-CM | POA: Diagnosis not present

## 2016-01-09 LAB — POCT INR: INR: 2.7

## 2016-01-10 ENCOUNTER — Emergency Department (HOSPITAL_COMMUNITY): Payer: Medicare HMO

## 2016-01-10 ENCOUNTER — Other Ambulatory Visit: Payer: Self-pay

## 2016-01-10 ENCOUNTER — Inpatient Hospital Stay (HOSPITAL_COMMUNITY)
Admission: EM | Admit: 2016-01-10 | Discharge: 2016-01-19 | DRG: 193 | Disposition: A | Discharge status: Home Health | Payer: Medicare HMO | Attending: Internal Medicine | Admitting: Internal Medicine

## 2016-01-10 ENCOUNTER — Encounter (HOSPITAL_COMMUNITY): Payer: Self-pay | Admitting: Nurse Practitioner

## 2016-01-10 DIAGNOSIS — R7989 Other specified abnormal findings of blood chemistry: Secondary | ICD-10-CM | POA: Diagnosis not present

## 2016-01-10 DIAGNOSIS — J1000 Influenza due to other identified influenza virus with unspecified type of pneumonia: Principal | ICD-10-CM | POA: Diagnosis present

## 2016-01-10 DIAGNOSIS — M7989 Other specified soft tissue disorders: Secondary | ICD-10-CM | POA: Diagnosis not present

## 2016-01-10 DIAGNOSIS — N186 End stage renal disease: Secondary | ICD-10-CM | POA: Diagnosis not present

## 2016-01-10 DIAGNOSIS — F329 Major depressive disorder, single episode, unspecified: Secondary | ICD-10-CM | POA: Diagnosis not present

## 2016-01-10 DIAGNOSIS — J440 Chronic obstructive pulmonary disease with acute lower respiratory infection: Secondary | ICD-10-CM | POA: Diagnosis not present

## 2016-01-10 DIAGNOSIS — IMO0002 Reserved for concepts with insufficient information to code with codable children: Secondary | ICD-10-CM | POA: Diagnosis present

## 2016-01-10 DIAGNOSIS — I255 Ischemic cardiomyopathy: Secondary | ICD-10-CM | POA: Diagnosis not present

## 2016-01-10 DIAGNOSIS — N485 Ulcer of penis: Secondary | ICD-10-CM | POA: Diagnosis not present

## 2016-01-10 DIAGNOSIS — I951 Orthostatic hypotension: Secondary | ICD-10-CM | POA: Diagnosis not present

## 2016-01-10 DIAGNOSIS — I4891 Unspecified atrial fibrillation: Secondary | ICD-10-CM | POA: Diagnosis not present

## 2016-01-10 DIAGNOSIS — I10 Essential (primary) hypertension: Secondary | ICD-10-CM | POA: Diagnosis not present

## 2016-01-10 DIAGNOSIS — E1129 Type 2 diabetes mellitus with other diabetic kidney complication: Secondary | ICD-10-CM | POA: Diagnosis present

## 2016-01-10 DIAGNOSIS — D649 Anemia, unspecified: Secondary | ICD-10-CM | POA: Diagnosis not present

## 2016-01-10 DIAGNOSIS — Z23 Encounter for immunization: Secondary | ICD-10-CM | POA: Diagnosis not present

## 2016-01-10 DIAGNOSIS — Z7902 Long term (current) use of antithrombotics/antiplatelets: Secondary | ICD-10-CM | POA: Diagnosis not present

## 2016-01-10 DIAGNOSIS — N2581 Secondary hyperparathyroidism of renal origin: Secondary | ICD-10-CM | POA: Diagnosis present

## 2016-01-10 DIAGNOSIS — F0390 Unspecified dementia without behavioral disturbance: Secondary | ICD-10-CM | POA: Diagnosis present

## 2016-01-10 DIAGNOSIS — E1165 Type 2 diabetes mellitus with hyperglycemia: Secondary | ICD-10-CM | POA: Diagnosis not present

## 2016-01-10 DIAGNOSIS — I272 Other secondary pulmonary hypertension: Secondary | ICD-10-CM | POA: Diagnosis not present

## 2016-01-10 DIAGNOSIS — Z992 Dependence on renal dialysis: Secondary | ICD-10-CM

## 2016-01-10 DIAGNOSIS — J101 Influenza due to other identified influenza virus with other respiratory manifestations: Secondary | ICD-10-CM | POA: Diagnosis present

## 2016-01-10 DIAGNOSIS — J918 Pleural effusion in other conditions classified elsewhere: Secondary | ICD-10-CM | POA: Diagnosis present

## 2016-01-10 DIAGNOSIS — Z7901 Long term (current) use of anticoagulants: Secondary | ICD-10-CM

## 2016-01-10 DIAGNOSIS — E785 Hyperlipidemia, unspecified: Secondary | ICD-10-CM | POA: Diagnosis present

## 2016-01-10 DIAGNOSIS — E1151 Type 2 diabetes mellitus with diabetic peripheral angiopathy without gangrene: Secondary | ICD-10-CM | POA: Diagnosis not present

## 2016-01-10 DIAGNOSIS — Z794 Long term (current) use of insulin: Secondary | ICD-10-CM

## 2016-01-10 DIAGNOSIS — I480 Paroxysmal atrial fibrillation: Secondary | ICD-10-CM | POA: Diagnosis present

## 2016-01-10 DIAGNOSIS — Z8249 Family history of ischemic heart disease and other diseases of the circulatory system: Secondary | ICD-10-CM | POA: Diagnosis not present

## 2016-01-10 DIAGNOSIS — I252 Old myocardial infarction: Secondary | ICD-10-CM

## 2016-01-10 DIAGNOSIS — E1122 Type 2 diabetes mellitus with diabetic chronic kidney disease: Secondary | ICD-10-CM | POA: Diagnosis not present

## 2016-01-10 DIAGNOSIS — I2510 Atherosclerotic heart disease of native coronary artery without angina pectoris: Secondary | ICD-10-CM | POA: Diagnosis present

## 2016-01-10 DIAGNOSIS — R0600 Dyspnea, unspecified: Secondary | ICD-10-CM

## 2016-01-10 DIAGNOSIS — J189 Pneumonia, unspecified organism: Secondary | ICD-10-CM | POA: Diagnosis present

## 2016-01-10 DIAGNOSIS — R0902 Hypoxemia: Secondary | ICD-10-CM

## 2016-01-10 DIAGNOSIS — Z95810 Presence of automatic (implantable) cardiac defibrillator: Secondary | ICD-10-CM

## 2016-01-10 DIAGNOSIS — Z833 Family history of diabetes mellitus: Secondary | ICD-10-CM | POA: Diagnosis not present

## 2016-01-10 DIAGNOSIS — Z955 Presence of coronary angioplasty implant and graft: Secondary | ICD-10-CM | POA: Diagnosis not present

## 2016-01-10 DIAGNOSIS — G8321 Monoplegia of upper limb affecting right dominant side: Secondary | ICD-10-CM | POA: Diagnosis present

## 2016-01-10 DIAGNOSIS — E1121 Type 2 diabetes mellitus with diabetic nephropathy: Secondary | ICD-10-CM | POA: Diagnosis present

## 2016-01-10 DIAGNOSIS — Z8674 Personal history of sudden cardiac arrest: Secondary | ICD-10-CM | POA: Diagnosis not present

## 2016-01-10 DIAGNOSIS — R778 Other specified abnormalities of plasma proteins: Secondary | ICD-10-CM | POA: Diagnosis present

## 2016-01-10 DIAGNOSIS — E11319 Type 2 diabetes mellitus with unspecified diabetic retinopathy without macular edema: Secondary | ICD-10-CM | POA: Diagnosis present

## 2016-01-10 DIAGNOSIS — Z87891 Personal history of nicotine dependence: Secondary | ICD-10-CM

## 2016-01-10 DIAGNOSIS — I132 Hypertensive heart and chronic kidney disease with heart failure and with stage 5 chronic kidney disease, or end stage renal disease: Secondary | ICD-10-CM | POA: Diagnosis present

## 2016-01-10 DIAGNOSIS — I5042 Chronic combined systolic (congestive) and diastolic (congestive) heart failure: Secondary | ICD-10-CM | POA: Diagnosis not present

## 2016-01-10 HISTORY — DX: Dependence on renal dialysis: Z99.2

## 2016-01-10 HISTORY — DX: End stage renal disease: N18.6

## 2016-01-10 LAB — CBC WITH DIFFERENTIAL/PLATELET
BASOS ABS: 0 10*3/uL (ref 0.0–0.1)
BASOS PCT: 0 %
EOS PCT: 3 %
Eosinophils Absolute: 0.2 10*3/uL (ref 0.0–0.7)
HCT: 32.7 % — ABNORMAL LOW (ref 39.0–52.0)
Hemoglobin: 10.6 g/dL — ABNORMAL LOW (ref 13.0–17.0)
LYMPHS PCT: 10 %
Lymphs Abs: 0.5 10*3/uL — ABNORMAL LOW (ref 0.7–4.0)
MCH: 31.9 pg (ref 26.0–34.0)
MCHC: 32.4 g/dL (ref 30.0–36.0)
MCV: 98.5 fL (ref 78.0–100.0)
Monocytes Absolute: 1.1 10*3/uL — ABNORMAL HIGH (ref 0.1–1.0)
Monocytes Relative: 20 %
NEUTROS ABS: 3.5 10*3/uL (ref 1.7–7.7)
Neutrophils Relative %: 67 %
PLATELETS: 153 10*3/uL (ref 150–400)
RBC: 3.32 MIL/uL — AB (ref 4.22–5.81)
RDW: 15.2 % (ref 11.5–15.5)
WBC: 5.3 10*3/uL (ref 4.0–10.5)

## 2016-01-10 LAB — BASIC METABOLIC PANEL
ANION GAP: 14 (ref 5–15)
BUN: 14 mg/dL (ref 6–20)
CALCIUM: 9.6 mg/dL (ref 8.9–10.3)
CO2: 30 mmol/L (ref 22–32)
Chloride: 93 mmol/L — ABNORMAL LOW (ref 101–111)
Creatinine, Ser: 3.71 mg/dL — ABNORMAL HIGH (ref 0.61–1.24)
GFR, EST AFRICAN AMERICAN: 19 mL/min — AB (ref >60)
GFR, EST NON AFRICAN AMERICAN: 16 mL/min — AB (ref >60)
Glucose, Bld: 138 mg/dL — ABNORMAL HIGH (ref 65–99)
POTASSIUM: 4.1 mmol/L (ref 3.5–5.1)
Sodium: 137 mmol/L (ref 135–145)

## 2016-01-10 LAB — I-STAT TROPONIN, ED: Troponin i, poc: 0.09 ng/mL (ref 0.00–0.08)

## 2016-01-10 LAB — PROTIME-INR
INR: 2.58 — ABNORMAL HIGH (ref 0.00–1.49)
Prothrombin Time: 27.3 seconds — ABNORMAL HIGH (ref 11.6–15.2)

## 2016-01-10 LAB — BRAIN NATRIURETIC PEPTIDE: B Natriuretic Peptide: 4360.3 pg/mL — ABNORMAL HIGH (ref 0.0–100.0)

## 2016-01-10 MED ORDER — DEXTROSE 5 % IV SOLN
1.0000 g | Freq: Once | INTRAVENOUS | Status: DC
  Filled 2016-01-10: qty 1

## 2016-01-10 MED ORDER — WARFARIN SODIUM 2.5 MG PO TABS
2.5000 mg | ORAL_TABLET | ORAL | Status: DC
  Administered 2016-01-11 – 2016-01-13 (×3): 2.5 mg via ORAL
  Filled 2016-01-10 (×4): qty 1

## 2016-01-10 MED ORDER — WARFARIN - PHARMACIST DOSING INPATIENT
Freq: Every day | Status: DC
  Administered 2016-01-16 – 2016-01-19 (×3)

## 2016-01-10 MED ORDER — WARFARIN SODIUM 5 MG PO TABS
5.0000 mg | ORAL_TABLET | ORAL | Status: DC
  Administered 2016-01-11: 5 mg via ORAL
  Filled 2016-01-10: qty 1

## 2016-01-10 MED ORDER — DEXTROSE 5 % IV SOLN
2.0000 g | INTRAVENOUS | Status: DC
  Administered 2016-01-12: 2 g via INTRAVENOUS
  Filled 2016-01-10 (×2): qty 2

## 2016-01-10 MED ORDER — DEXTROSE 5 % IV SOLN
200.0000 mg | Freq: Once | INTRAVENOUS | Status: AC
  Administered 2016-01-11: 200 mg via INTRAVENOUS
  Filled 2016-01-10: qty 4

## 2016-01-10 MED ORDER — DOCUSATE SODIUM 100 MG PO CAPS: 100.0000 mg | ORAL_CAPSULE | Freq: Every day | ORAL | Status: DC | PRN

## 2016-01-10 MED ORDER — NITROGLYCERIN 0.4 MG SL SUBL: 0.4000 mg | SUBLINGUAL_TABLET | SUBLINGUAL | Status: DC | PRN

## 2016-01-10 MED ORDER — INSULIN ASPART 100 UNIT/ML ~~LOC~~ SOLN
0.0000 [IU] | Freq: Three times a day (TID) | SUBCUTANEOUS | Status: DC
  Administered 2016-01-11: 1 [IU] via SUBCUTANEOUS
  Administered 2016-01-12: 2 [IU] via SUBCUTANEOUS
  Administered 2016-01-14: 1 [IU] via SUBCUTANEOUS
  Administered 2016-01-14 – 2016-01-15 (×2): 2 [IU] via SUBCUTANEOUS
  Administered 2016-01-16: 1 [IU] via SUBCUTANEOUS
  Administered 2016-01-17 – 2016-01-18 (×2): 2 [IU] via SUBCUTANEOUS
  Administered 2016-01-19: 1 [IU] via SUBCUTANEOUS
  Administered 2016-01-19: 2 [IU] via SUBCUTANEOUS

## 2016-01-10 MED ORDER — ADULT MULTIVITAMIN W/MINERALS CH
1.0000 | ORAL_TABLET | Freq: Every day | ORAL | Status: DC
  Administered 2016-01-11 – 2016-01-17 (×7): 1 via ORAL
  Filled 2016-01-10 (×7): qty 1

## 2016-01-10 MED ORDER — ASPIRIN 81 MG PO CHEW: 324.0000 mg | CHEWABLE_TABLET | Freq: Once | ORAL | Status: DC

## 2016-01-10 MED ORDER — INSULIN ASPART 100 UNIT/ML ~~LOC~~ SOLN: 0.0000 [IU] | Freq: Every day | SUBCUTANEOUS | Status: DC

## 2016-01-10 MED ORDER — AMIODARONE HCL 100 MG PO TABS
100.0000 mg | ORAL_TABLET | Freq: Every day | ORAL | Status: DC
  Administered 2016-01-11 – 2016-01-19 (×9): 100 mg via ORAL
  Filled 2016-01-10 (×10): qty 1

## 2016-01-10 MED ORDER — ALBUTEROL SULFATE (2.5 MG/3ML) 0.083% IN NEBU: 2.5000 mg | INHALATION_SOLUTION | RESPIRATORY_TRACT | Status: DC | PRN

## 2016-01-10 MED ORDER — OXYCODONE-ACETAMINOPHEN 5-325 MG PO TABS
1.0000 | ORAL_TABLET | ORAL | Status: DC | PRN
  Administered 2016-01-11 – 2016-01-13 (×8): 1 via ORAL
  Filled 2016-01-10 (×7): qty 1

## 2016-01-10 MED ORDER — DEXTROSE 5 % IV SOLN
200.0000 mg | INTRAVENOUS | Status: DC
  Administered 2016-01-11 – 2016-01-14 (×3): 200 mg via INTRAVENOUS
  Filled 2016-01-10 (×4): qty 4

## 2016-01-10 MED ORDER — DM-GUAIFENESIN ER 30-600 MG PO TB12
1.0000 | ORAL_TABLET | Freq: Two times a day (BID) | ORAL | Status: DC
Start: 2016-01-10 — End: 2016-01-19
  Administered 2016-01-11 – 2016-01-19 (×17): 1 via ORAL
  Filled 2016-01-10 (×17): qty 1

## 2016-01-10 MED ORDER — VANCOMYCIN HCL 10 G IV SOLR
1750.0000 mg | Freq: Once | INTRAVENOUS | Status: AC
  Administered 2016-01-10: 1750 mg via INTRAVENOUS
  Filled 2016-01-10: qty 1750

## 2016-01-10 MED ORDER — ATORVASTATIN CALCIUM 40 MG PO TABS
40.0000 mg | ORAL_TABLET | Freq: Every day | ORAL | Status: DC
  Administered 2016-01-11 – 2016-01-19 (×9): 40 mg via ORAL
  Filled 2016-01-10 (×10): qty 1

## 2016-01-10 MED ORDER — CLOPIDOGREL BISULFATE 75 MG PO TABS
75.0000 mg | ORAL_TABLET | Freq: Every day | ORAL | Status: DC
  Administered 2016-01-11 – 2016-01-19 (×9): 75 mg via ORAL
  Filled 2016-01-10 (×10): qty 1

## 2016-01-10 MED ORDER — OXYCODONE-ACETAMINOPHEN 5-325 MG PO TABS
1.0000 | ORAL_TABLET | Freq: Once | ORAL | Status: AC
  Administered 2016-01-10: 1 via ORAL
  Filled 2016-01-10: qty 1

## 2016-01-10 MED ORDER — CARVEDILOL 6.25 MG PO TABS
9.3750 mg | ORAL_TABLET | Freq: Two times a day (BID) | ORAL | Status: DC
  Administered 2016-01-11 – 2016-01-14 (×5): 9.375 mg via ORAL
  Filled 2016-01-10 (×6): qty 1

## 2016-01-10 MED ORDER — VANCOMYCIN HCL IN DEXTROSE 1-5 GM/200ML-% IV SOLN
1000.0000 mg | INTRAVENOUS | Status: DC
  Administered 2016-01-12: 1 g via INTRAVENOUS
  Filled 2016-01-10: qty 200

## 2016-01-10 MED ORDER — DEXTROSE 5 % IV SOLN
2.0000 g | Freq: Once | INTRAVENOUS | Status: AC
  Administered 2016-01-11: 2 g via INTRAVENOUS
  Filled 2016-01-10: qty 2

## 2016-01-10 MED ORDER — MOMETASONE FURO-FORMOTEROL FUM 200-5 MCG/ACT IN AERO
2.0000 | INHALATION_SPRAY | Freq: Two times a day (BID) | RESPIRATORY_TRACT | Status: DC
  Administered 2016-01-11 – 2016-01-19 (×12): 2 via RESPIRATORY_TRACT
  Filled 2016-01-10 (×3): qty 8.8

## 2016-01-10 MED ORDER — VANCOMYCIN HCL IN DEXTROSE 1-5 GM/200ML-% IV SOLN
1000.0000 mg | Freq: Once | INTRAVENOUS | Status: DC
  Filled 2016-01-10: qty 200

## 2016-01-10 MED ORDER — LOSARTAN POTASSIUM 25 MG PO TABS
25.0000 mg | ORAL_TABLET | Freq: Every day | ORAL | Status: DC
  Administered 2016-01-11 – 2016-01-14 (×4): 25 mg via ORAL
  Filled 2016-01-10 (×4): qty 1

## 2016-01-10 NOTE — ED Provider Notes (Deleted)
HPI Comments: Tony Freeman is a 65 y.o. male who presents to the Emergency Department complaining of a worsening point of swelling to the distal end of his penis onset 1 week ago. Pt was seen at [facility] and discharged with an rx for nystatin which has provided no significant relief. He reports associated dysuria, penile pain and drainage from the site of the wound noting that it has ruptured. He denies any sexual activity for the past 6-7 months. He is a dialysis pt.  He denies penile discharge, fever and chills.  Patient is brought in by his daughter in law, who is more concerned about his SOB.  Per his daughter in law, pt had a cardioversion performed 1 week ago. She reports he recently got over pneumonia and has been complaining of worsening SOB. She also notes he cut his feet recently while clipping his toenails resulting in two wounds that bled for some time before they were able to control the bleeding.   Patient will be moved to acute side for further workup of SOB.  PE: Gen: A&O x4 HEENT: PERRL, EOM intact CHEST: RRR, no m/r/g LUNGS: CTAB, no w/r/r ABD: BS x 4, ND/NT GU: Open sore on the tip of penis of unclear etiology.  DDx includes chancre, coalescing herpes... EXT: No edema, strong peripheral pulses NEURO: Sensation and strength intact bilaterally  Filed Vitals:   01/10/16 1820  BP: 92/61  Pulse: 79  Temp: 98.3 F (36.8 C)  Resp: 20    Noted to be mildly hypotensive, but not it acute distress.    Montine Circle, PA-C 01/10/16 1910

## 2016-01-10 NOTE — Progress Notes (Signed)
Pharmacy Antibiotic Note  Tony Freeman is a 65 y.o. male admitted on 01/10/2016 with Penile soreness/swelling.  Pharmacy has been consulted for Acyclovir dosing for possible herpes infection.   Pt has ESRD on HD Tues/Thurs/Sat  Plan: -Acyclovir 200 mg IV x 1 now, then q24h (ensure given after HD on HD days) -F/U infectious work-up  Temp (24hrs), Avg:98.3 F (36.8 C), Min:98.3 F (36.8 C), Max:98.3 F (36.8 C)   Recent Labs Lab 01/10/16 2005  WBC 5.3  CREATININE 3.71*    CrCl cannot be calculated (Unknown ideal weight.).    No Known Allergies  Narda Bonds 01/10/2016 11:25 PM

## 2016-01-10 NOTE — Progress Notes (Signed)
Attempted report 

## 2016-01-10 NOTE — ED Notes (Signed)
Report attempted 

## 2016-01-10 NOTE — ED Provider Notes (Signed)
MSE was initiated and I personally evaluated the patient and placed orders (if any) at  7:10 PM on January 10, 2016.   HPI Comments: Tony Freeman is a 65 y.o. male who presents to the Emergency Department complaining of a worsening point of swelling to the distal end of his penis onset 1 week ago. Pt was seen at [facility] and discharged with an rx for nystatin which has provided no significant relief. He reports associated dysuria, penile pain and drainage from the site of the wound noting that it has ruptured. He denies any sexual activity for the past 6-7 months. He is a dialysis pt.  He denies penile discharge, fever and chills.  Patient is brought in by his daughter in law, who is more concerned about his SOB.  Per his daughter in law, pt had a cardioversion performed 1 week ago. She reports he recently got over pneumonia and has been complaining of worsening SOB. She also notes he cut his feet recently while clipping his toenails resulting in two wounds that bled for some time before they were able to control the bleeding.   Patient will be moved to acute side for further workup of SOB.  PE: Gen: A&O x4 HEENT: PERRL, EOM intact CHEST: RRR, no m/r/g LUNGS: CTAB, no w/r/r ABD: BS x 4, ND/NT GU: Open sore on the tip of penis of unclear etiology.  DDx includes chancre, coalescing herpes... EXT: No edema, strong peripheral pulses NEURO: Sensation and strength intact bilaterally  Filed Vitals:   01/10/16 1820  BP: 92/61  Pulse: 79  Temp: 98.3 F (36.8 C)  Resp: 20    Noted to be mildly hypotensive, but not it acute distress.  The patient appears stable so that the remainder of the MSE may be completed by another provider.  Montine Circle, PA-C 01/10/16 [place], MD 01/10/16 2015

## 2016-01-10 NOTE — Progress Notes (Addendum)
Pharmacy Antibiotic Note  Tony Freeman is a 65 y.o. male admitted on 01/10/2016 with pneumonia. Pt w/ ESRD receiving HD on TuThSat. Pharmacy has been consulted for Vancomycin dosing. WBC 5.3, afebrile, CrCl ~71mL/min. PT also receiving cefepime   Plan: Vancomycin 1750mg  IV load x1 in the ED F/U HD schedule and further abx dosing  Cefepime 2g IV x1 in the ED     Temp (24hrs), Avg:98.3 F (36.8 C), Min:98.3 F (36.8 C), Max:98.3 F (36.8 C)   Recent Labs Lab 01/10/16 2005  WBC 5.3  CREATININE 3.71*    CrCl cannot be calculated (Unknown ideal weight.).    No Known Allergies  Antimicrobials this admission: 3/16 Vancomycin>>  Thank you for allowing pharmacy to be a part of this patient's care.  Verdis Bassette C. Lennox Grumbles, PharmD Pharmacy Resident  Pager: [phone] 01/10/2016 9:25 PM

## 2016-01-10 NOTE — ED Notes (Signed)
He c/o 1 week history of painful boil on penis that has now open and is draining. He was seen at an [facility] for this and given nystatin which he has been applying with no relief.

## 2016-01-10 NOTE — ED Notes (Signed)
Informed Will, PA of patient's Troponin

## 2016-01-10 NOTE — ED Provider Notes (Signed)
CSN: [id number]     Arrival date & time 01/10/16  1748 History   First MD Initiated Contact with Patient 01/10/16 2007     Chief Complaint  Patient presents with  . Shortness of Breath  . Skin Problem    Tony Freeman is a 65 y.o. male who presents to the ED today after his dialysis complaining of a sore to the tip of his penis for one week.  The patient was  Seen in urgent care and prescribed nystatin. He reports no relief with nystatin. He also reports some dysuria but no other urinary symptoms. He denies being sexually active. Patient is also in the emergency department with his  Daughter-in-law who is also concerned about the patient's shortness of breath he's been having. Currently the patient denies having any chest pain or shortness of breath. Later, he does endorse some SOB. He was diagnosed with pneumonia approximately 2 weeks ago and completed antibiotic therapy for this. He also had a recent admission on 01/02/2016 when he had a cardioversion for atrial fibrillation.  Patient has end-stage renal disease and is on dialysis Tuesday, Thursday and Saturday. He  Last had complete dialysis today.  The patient denies fevers, chest pain, shortness of breath, abdominal pain, nausea, vomiting, diarrhea, or other rashes.   Patient is a 65 y.o. male presenting with shortness of breath. The history is provided by the patient and a relative. No language interpreter was used.  Shortness of Breath Associated symptoms: no abdominal pain, no chest pain, no cough, no fever, no headaches, no neck pain, no rash, no sore throat, no vomiting and no wheezing     Past Medical History  Diagnosis Date  . Systolic and diastolic CHF, chronic ([place])     a. Echo at [facility] 11/17/2013 Ef <25%, severe hypokinesis of LV, moderately dilated LA, grade IV/IV diastolic dysfunction with irreversible restrictive pattern of mitral inflow, severe pulm HTN (RVSP >42mmHg), 1-2+ MR  b. Echo at [facility] 04/02/14 EF 25-30%, moderate  concentric LVH, diffuse hypokinesis, moderately dilated LA, no significant MR observed  . CAD (coronary artery disease)     a. PCI LAD/RCA in 2007  b. PCI LAD/RCA in 2009 at [facility]  c. PCI LAD 08/2013, CTO of RCA and nonobstructive disease in the LCx.  Marland Kitchen Hyperlipidemia   . Dementia   . Ischemic cardiomyopathy     a. s/p Biotronik ICD 06/2014.  Marland Kitchen Retinopathy     bilateral  . HTN (hypertension)   . Abnormal renal ultrasound     a. 03/2014: Abnormal renal ultrasound with left renal lesion and bladder wall thickening and microscopic hematuria. Multiple cysts on prior MRI. Given inability to use contrast, f/u imaging limited, repeat US 3 months with outpatient uro f/u.  Marland Kitchen Paroxysmal atrial fibrillation (HCC)   . Paroxysmal atrial flutter ([place])   . Anemia   . Myocardial infarction [facility]) July 2016    Heart attack  . Peripheral vascular disease ([place])   . Stroke [facility])     right arm weakness  . Asthma   . CKD (chronic kidney disease), stage IV ([place])     on dialysis T/TH/Sa  . 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 H/O cardiac arrest 04/2015  . Constipation   . AICD (automatic cardioverter/defibrillator) present   . Pneumonia     when he was younger  . ESRD (end stage renal disease) on dialysis [facility])    Past Surgical History  Procedure  Laterality Date  . Coronary stent placement      Multivessel coronary intervention in 2007 and 2009. Ischemic cardiomyopathyPCI LAD/ RCA in 2007 by Dr. Dwyane Dee. PCI LAD/RCA in 2009 at [facility]; PCI LAD 08/2013. Presented with unstable angina pectoris.. Coronary angio demonstrated proximal and distal LAD lesions.27/20 PROMUS stent in proximal LAD// PTCA of distal LAD  . Cardiac defibrillator placement    . Spine surgery      anterior fusion  . Hernia repair    . Tee without cardioversion N/A 07/26/2014    Procedure: TRANSESOPHAGEAL ECHOCARDIOGRAM (TEE);  Surgeon: Larey Dresser, MD;  Location: [place];  Service:  Cardiovascular;  Laterality: N/A;  . Cardioversion N/A 07/26/2014    Procedure: CARDIOVERSION;  Surgeon: Larey Dresser, MD;  Location: [place];  Service: Cardiovascular;  Laterality: N/A;  . Right heart catheterization N/A 07/25/2014    Procedure: RIGHT HEART CATH;  Surgeon: Jolaine Artist, MD;  Location: [facility] CATH LAB;  Service: Cardiovascular;  Laterality: N/A;  . Cardiac defibrillator placement  06/2014  . Insertion of dialysis catheter    . Eye surgery Bilateral     for burst blood vessel  . Cardiac catheterization  04/2015  . Coronary angioplasty    . Colonoscopy w/ polypectomy    . Finger fracture surgery Right     little finger  . Av fistula placement Left 08/03/2015    Procedure: ARTERIOVENOUS (AV) FISTULA CREATION VERSUS BASILIC VEIN TRANSPOSITION VERSUS ARTERIOVENOUS GRAFT INSERTION;  Surgeon: Angelia Mould, MD;  Location: [place];  Service: Vascular;  Laterality: Left;  . Peripheral vascular catheterization N/A 11/19/2015    Procedure: Fistulagram;  Surgeon: Angelia Mould, MD;  Location: [place] CV LAB;  Service: Cardiovascular;  Laterality: N/A;  . Tee without cardioversion N/A 01/02/2016    Procedure: TRANSESOPHAGEAL ECHOCARDIOGRAM (TEE);  Surgeon: Larey Dresser, MD;  Location: [place];  Service: Cardiovascular;  Laterality: N/A;  . Cardioversion N/A 01/02/2016    Procedure: CARDIOVERSION;  Surgeon: Larey Dresser, MD;  Location: [facility] ENDOSCOPY;  Service: Cardiovascular;  Laterality: N/A;   Family History  Problem Relation Age of Onset  . Heart disease      brother  . Diabetes Mother     Bilateral Leg  . Heart disease Mother   . Cancer Father     Right neck  . Kidney disease    . Diabetes Sister   . Hyperlipidemia Sister   . Hypertension Sister   . Diabetes Brother   . Hyperlipidemia Brother   . Hypertension Brother   . Heart attack Brother    Social History  Substance Use Topics  . Smoking status: Never Smoker   . Smokeless tobacco:  Never Used  . Alcohol Use: No    Review of Systems  Constitutional: Negative for fever and chills.  HENT: Negative for congestion and sore throat.   Eyes: Negative for visual disturbance.  Respiratory: Positive for shortness of breath. Negative for cough, chest tightness and wheezing.   Cardiovascular: Negative for chest pain and palpitations.  Gastrointestinal: Negative for nausea, vomiting, abdominal pain and diarrhea.  Genitourinary: Positive for dysuria and genital sores. Negative for urgency, hematuria, decreased urine volume, penile swelling, scrotal swelling, difficulty urinating and testicular pain.  Musculoskeletal: Negative for back pain and neck pain.  Skin: Negative for rash.  Neurological: Negative for headaches.      Allergies  Review of patient's allergies indicates no known allergies.  Home Medications   Prior to Admission medications  Medication Sig Start Date End Date Taking? Authorizing Provider  acetaminophen (TYLENOL) 325 MG tablet Take 650 mg by mouth every 8 (eight) hours as needed for headache (pain).    Yes Historical Provider, MD  albuterol (PROVENTIL HFA;VENTOLIN HFA) 108 (90 BASE) MCG/ACT inhaler Inhale 2 puffs into the lungs every 6 (six) hours as needed for wheezing or shortness of breath.   Yes Historical Provider, MD  albuterol (PROVENTIL) (2.5 MG/3ML) 0.083% nebulizer solution Take 2.5 mg by nebulization every 6 (six) hours as needed for wheezing or shortness of breath.   Yes Historical Provider, MD  amiodarone (PACERONE) 200 MG tablet Take 100 mg by mouth daily.    Yes Historical Provider, MD  atorvastatin (LIPITOR) 40 MG tablet Take 1 tablet (40 mg total) by mouth daily. 12/12/15  Yes Will Meredith Leeds, MD  carvedilol (COREG) 6.25 MG tablet Take 1.5 tablets (9.375 mg total) by mouth 2 (two) times daily with a meal. 10/05/15  Yes Larey Dresser, MD  clopidogrel (PLAVIX) 75 MG tablet TAKE ONE TABLET BY MOUTH ONCE DAILY 12/12/15  Yes Larey Dresser,  MD  docusate sodium (COLACE) 100 MG capsule Take 100 mg by mouth daily as needed (constipation).    Yes Historical Provider, MD  losartan (COZAAR) 25 MG tablet Take 1 tablet (25 mg total) by mouth daily. 11/16/15  Yes Larey Dresser, MD  mometasone-formoterol [facility]) 200-5 MCG/ACT AERO Inhale 2 puffs into the lungs 2 (two) times daily as needed for wheezing or shortness of breath.   Yes Historical Provider, MD  Multiple Vitamin (MULTIVITAMIN WITH MINERALS) TABS tablet Take 1 tablet by mouth daily.   Yes Historical Provider, MD  nitroGLYCERIN (NITROSTAT) 0.4 MG SL tablet Place 1 tablet (0.4 mg total) under the tongue every 5 (five) minutes as needed for chest pain. 10/02/15  Yes Larey Dresser, MD  nystatin ointment (MYCOSTATIN) Apply 1 application topically as needed. 01/07/16  Yes Historical Provider, MD  warfarin (COUMADIN) 5 MG tablet Take 2.5-5 mg by mouth as directed. Take 5 mg (1 tablet) on Mon, Wed, Fri and 2.5 mg (1/2 tablet) on Tues, Thurs, Sat, Sun   Yes Historical Provider, MD  enoxaparin (LOVENOX) 80 MG/0.8ML injection Inject 0.8 mLs (80 mg total) into the skin daily. Patient not taking: Reported on 01/10/2016 11/26/15   Larey Dresser, MD  insulin lispro (HUMALOG) 100 UNIT/ML injection Inject 10 Units into the skin 2 (two) times daily as needed for high blood sugar (CBG >180). Reported on 01/10/2016    Historical Provider, MD   BP 108/63 mmHg  Pulse 71  Temp(Src) 98.4 F (36.9 C) (Oral)  Resp 19  Ht 5\' 6"  (1.676 m)  Wt 79.742 kg  BMI 28.39 kg/m2  SpO2 96% Physical Exam  Constitutional: He appears well-developed and well-nourished. No distress.   Nontoxic-appearing.  HENT:  Head: Normocephalic and atraumatic.  Mouth/Throat: Oropharynx is clear and moist.  Eyes: Conjunctivae are normal. Pupils are equal, round, and reactive to light. Right eye exhibits no discharge. Left eye exhibits no discharge.  Neck: Neck supple.  Cardiovascular: Normal rate, regular rhythm, normal heart  sounds and intact distal pulses.  Exam reveals no gallop and no friction rub.   No murmur heard. Pulmonary/Chest: Effort normal. No respiratory distress. He has no wheezes. He has no rales.   Lung sounds are diminished to his bilateral lower bases. Lungs are clear to auscultation bilaterally otherwise.  Abdominal: Soft. There is no tenderness. There is no guarding.  Genitourinary:  Ulcer-like lesion to the tip of his penis. Patient is circumcised. No testicular tenderness. No penile discharge.  Musculoskeletal: He exhibits no edema.  Lymphadenopathy:    He has no cervical adenopathy.  Neurological: He is alert. Coordination normal.  Skin: Skin is warm and dry. No rash noted. He is not diaphoretic. No erythema. No pallor.  Psychiatric: He has a normal mood and affect. His behavior is normal.  Nursing note and vitals reviewed.   ED Course  Procedures (including critical care time) Labs Review Labs Reviewed  CBC WITH DIFFERENTIAL/PLATELET - Abnormal; Notable for the following:    RBC 3.32 (*)    Hemoglobin 10.6 (*)    HCT 32.7 (*)    Lymphs Abs 0.5 (*)    Monocytes Absolute 1.1 (*)    All other components within normal limits  BASIC METABOLIC PANEL - Abnormal; Notable for the following:    Chloride 93 (*)    Glucose, Bld 138 (*)    Creatinine, Ser 3.71 (*)    GFR calc non Af Amer 16 (*)    GFR calc Af Amer 19 (*)    All other components within normal limits  BRAIN NATRIURETIC PEPTIDE - Abnormal; Notable for the following:    B Natriuretic Peptide 4360.3 (*)    All other components within normal limits  PROTIME-INR - Abnormal; Notable for the following:    Prothrombin Time 27.3 (*)    INR 2.58 (*)    All other components within normal limits  TROPONIN I - Abnormal; Notable for the following:    Troponin I 0.09 (*)    All other components within normal limits  GLUCOSE, CAPILLARY - Abnormal; Notable for the following:    Glucose-Capillary 165 (*)    All other components  within normal limits  I-STAT TROPOININ, ED - Abnormal; Notable for the following:    Troponin i, poc 0.09 (*)    All other components within normal limits  WOUND CULTURE  RESPIRATORY VIRUS PANEL  CULTURE, BLOOD (ROUTINE X 2)  CULTURE, BLOOD (ROUTINE X 2)  CULTURE, EXPECTORATED SPUTUM-ASSESSMENT  GRAM STAIN  MRSA PCR SCREENING  URINALYSIS, ROUTINE W REFLEX MICROSCOPIC (NOT AT [facility])  RPR  HIV ANTIBODY (ROUTINE TESTING)  INFLUENZA PANEL BY PCR (TYPE A & B, H1N1)  TROPONIN I  TROPONIN I  STREP PNEUMONIAE URINARY ANTIGEN  LEGIONELLA PNEUMOPHILA SEROGP 1 UR AG  GC/CHLAMYDIA PROBE AMP ([place]) NOT AT [facility]    Imaging Review Dg Chest 2 View  01/10/2016  CLINICAL DATA:  Shortness of breath EXAM: CHEST  2 VIEW COMPARISON:  March 30 26 FINDINGS: The heart size and mediastinal contours are stable. Heart size is enlarged. Cardiac pacemaker is unchanged. Dual lumen right central venous line are noted with distal tips in the superior vena cava. There is a moderate right pleural effusion with consolidation of right lung base. There is a small left pleural effusion. The visualized skeletal structures are stable. IMPRESSION: Moderate right pleural effusion with consolidation right lung base, underlying pneumonia is not excluded. Small left pleural effusion. Electronically Signed   By: Abelardo Diesel M.D.   On: 01/10/2016 19:22   I have personally reviewed and evaluated these images and lab results as part of my medical decision-making.   EKG Interpretation   Date/Time:  Thursday January 10 2016 19:37:06 EDT Ventricular Rate:  70 PR Interval:  224 QRS Duration: 98 QT Interval:  472 QTC Calculation: 509 R Axis:   141 Text Interpretation:  Electronic atrial pacemaker Left  posterior  fascicular block ST \\T \ T wave abnormality, consider lateral ischemia  Prolonged QT Abnormal ECG t wave changes are new since last tracing,  similar to ECG 01 Apr 2014 Confirmed by KNAPP  MD-J, JON ([id number]) on   01/10/2016 8:32:56 PM      Filed Vitals:   01/10/16 2315 01/10/16 2330 01/10/16 2345 01/11/16 0026  BP: 115/76 109/71 110/67 108/63  Pulse: 73 69 70 71  Temp:    98.4 F (36.9 C)  TempSrc:    Oral  Resp: 17 20 22 19   Height:    5\' 6"  (1.676 m)  Weight:    79.742 kg  SpO2: 95% 95% 93% 96%     MDM   Meds given in ED:  Medications  ceFEPIme (MAXIPIME) 2 g in dextrose 5 % 50 mL IVPB (not administered)  oxyCODONE-acetaminophen (PERCOCET/ROXICET) 5-325 MG per tablet 1 tablet (1 tablet Oral Given 01/11/16 0102)  amiodarone (PACERONE) tablet 100 mg (not administered)  atorvastatin (LIPITOR) tablet 40 mg (not administered)  clopidogrel (PLAVIX) tablet 75 mg (not administered)  losartan (COZAAR) tablet 25 mg (not administered)  mometasone-formoterol (DULERA) 200-5 MCG/ACT inhaler 2 puff (2 puffs Inhalation Not Given 01/11/16 0035)  multivitamin with minerals tablet 1 tablet (not administered)  carvedilol (COREG) tablet 9.375 mg (not administered)  nitroGLYCERIN (NITROSTAT) SL tablet 0.4 mg (not administered)  docusate sodium (COLACE) capsule 100 mg (not administered)  albuterol (PROVENTIL) (2.5 MG/3ML) 0.083% nebulizer solution 2.5 mg (not administered)  dextromethorphan-guaiFENesin (MUCINEX DM) 30-600 MG per 12 hr tablet 1 tablet (1 tablet Oral Not Given 01/11/16 0109)  insulin aspart (novoLOG) injection 0-9 Units (not administered)  insulin aspart (novoLOG) injection 0-5 Units (0 Units Subcutaneous Not Given 01/11/16 0020)  vancomycin (VANCOCIN) IVPB 1000 mg/200 mL premix (not administered)  ceFEPIme (MAXIPIME) 2 g in dextrose 5 % 50 mL IVPB (not administered)  acyclovir (ZOVIRAX) 200 mg in dextrose 5 % 100 mL IVPB (200 mg Intravenous Given 01/11/16 0102)  acyclovir (ZOVIRAX) 200 mg in dextrose 5 % 100 mL IVPB (not administered)  warfarin (COUMADIN) tablet 2.5 mg (2.5 mg Oral Given 01/11/16 0102)  Warfarin - Pharmacist Dosing Inpatient (not administered)  warfarin (COUMADIN) tablet 5 mg  (not administered)  pneumococcal 23 valent vaccine (PNU-IMMUNE) injection 0.5 mL (not administered)  oxyCODONE-acetaminophen (PERCOCET/ROXICET) 5-325 MG per tablet 1 tablet (1 tablet Oral Given 01/10/16 2000)  vancomycin (VANCOCIN) 1,750 mg in sodium chloride 0.9 % 500 mL IVPB (1,750 mg Intravenous New Bag/Given 01/10/16 2204)    Current Discharge Medication List      Final diagnoses:  HCAP (healthcare-associated pneumonia)   This is a 65 y.o. male who presents to the ED today after his dialysis complaining of a sore to the tip of his penis for one week.  The patient was  Seen in urgent care and prescribed nystatin. He reports no relief with nystatin. He also reports some dysuria but no other urinary symptoms. He denies being sexually active. Patient is also in the emergency department with his  Daughter-in-law who is also concerned about the patient's shortness of breath he's been having. Currently the patient denies having any chest pain or shortness of breath. Later, he does endorse some SOB. He was diagnosed with pneumonia approximately 2 weeks ago and completed antibiotic therapy for this. He also had a recent admission on 01/02/2016 when he had a cardioversion for atrial fibrillation.  Patient has end-stage renal disease and is on dialysis Tuesday, Thursday and Saturday. He last had complete dialysis today.  on exam the patient is afebrile nontoxic appearing. He does have some diminished lung sounds to his bilateral bases.  Patient has what appears to be an ulcer-like lesion to the tip of his penis. No other lesions or rashes noted. No vesicles noted. The lesion is painful. I doubt syphilis. Could be coalescing herpetic lesions.  I ordered gonorrhea, chlamydia, RPR and HIV testing. Patient denies being sexually active.  patient's INR is therapeutic at 2.58. CBC reveals a hemoglobin of 10.6. No leukocytosis. BNP is elevated at 4360. Troponin is elevated at 0.09. EKG is unchanged from his previous.  Suspect demand ischemia. He denies chest pain.   Electrolytes are stable on BMP. Patient had dialysis today.  chest x-ray reveals a moderate  Right pleural effusion with consolidation in the right lung base, underlying pneumonia is not excluded. I'm concerned for pneumonia with this patient's intermittent shortness of breath and recent diagnosis of pneumonia. Will start on HCAP Antibiotics and admit to medicine. Patient is in agreement with admission.   This patient was discussed with and evaluated by Dr. Tomi Bamberger who agrees with assessment and plan.    Waynetta Pean, PA-C 01/11/16 [id number]  Dorie Rank, MD 01/11/16 1239

## 2016-01-10 NOTE — H&P (Signed)
[facility] History and Physical  Ridha Delligatti [id number] DOB: 03/18/1951 DOA: 01/10/2016  Referring physician: ED physician PCP: Tanya Nones, MD  Specialists:   Chief Complaint: Shortness of breath, ulcer over penis  HPI: Tony Freeman is a 65 y.o. male with PMH of ESRD-HD, hypertension, hyperlipidemia, diabetes mellitus, CAD, S/P stent placement, AICD, chronic systolic and diastolic congestive heart failure with EF 15-20 percent, dementia, PAF on Coumadin, PVD, stroke, who presents with shortness of breath and penis ulcer.  Patient reports that he has been having shortness of breath and cough ion the past 2 weeks. He coughs up greenish colored sputum. He does not have chest pain, fever or chills. He also reports having an ulcer in his penis. He states that he noticed a blister over penis glans a week ago, which has broken and formed a ulcer. It is painful. He does not have penile discharge or symptoms of UTI. Patient was sent by [facility], and was given prescription of nystatin, without improvement. Patient denies abdominal pain, nausea, vomiting, diarrhea, symptoms of  UTI. No unilateral weakness.  In ED, patient was found to have INR 2.58, troponin 0.09, BNP 4360, pending urinalysis, WBC 5.3, no tachycardia, no tachypnea, temperature normal, creatinine is 3.71, BUN 14. Chest x-ray showed right lung base infiltration and moderate right pleural effusion.  EKG: Independently reviewed. QTC 5 overnight, paced rhythm.  Where does patient live?   At home Can patient participate in ADLs?   Little  Review of Systems:   General: no fevers, chills, no changes in body weight, has poor appetite, has fatigue HEENT: no blurry vision, hearing changes or sore throat Pulm: has dyspnea, coughing, no wheezing CV: no chest pain, no palpitations Abd: no nausea, vomiting, abdominal pain, diarrhea, constipation GU: no dysuria, burning on urination, increased urinary frequency,  hematuria.  Ext: no leg edema Neuro: no unilateral weakness, numbness, or tingling, no vision change or hearing loss Skin: no rash MSK: No muscle spasm, no deformity, no limitation of range of movement in spin Heme: No easy bruising.  Travel history: No recent long distant travel.  Allergy: No Known Allergies  Past Medical History  Diagnosis Date  . Systolic and diastolic CHF, chronic ([place])     a. Echo at [facility] 11/17/2013 Ef <25%, severe hypokinesis of LV, moderately dilated LA, grade IV/IV diastolic dysfunction with irreversible restrictive pattern of mitral inflow, severe pulm HTN (RVSP >11mmHg), 1-2+ MR  b. Echo at [facility] 04/02/14 EF 25-30%, moderate concentric LVH, diffuse hypokinesis, moderately dilated LA, no significant MR observed  . CAD (coronary artery disease)     a. PCI LAD/RCA in 2007  b. PCI LAD/RCA in 2009 at [facility]  c. PCI LAD 08/2013, CTO of RCA and nonobstructive disease in the LCx.  Marland Kitchen Hyperlipidemia   . Dementia   . Ischemic cardiomyopathy     a. s/p Biotronik ICD 06/2014.  Marland Kitchen Retinopathy     bilateral  . HTN (hypertension)   . Abnormal renal ultrasound     a. 03/2014: Abnormal renal ultrasound with left renal lesion and bladder wall thickening and microscopic hematuria. Multiple cysts on prior MRI. Given inability to use contrast, f/u imaging limited, repeat US 3 months with outpatient uro f/u.  Marland Kitchen Paroxysmal atrial fibrillation (HCC)   . Paroxysmal atrial flutter ([place])   . Anemia   . Myocardial infarction [facility]) July 2016    Heart attack  . Peripheral vascular disease ([place])   . Stroke [facility])  right arm weakness  . Asthma   . CKD (chronic kidney disease), stage IV ([place])     on dialysis T/TH/Sa  . Diabetes mellitus with nephropathy ([place])     type 2  . H/O cardiac arrest 04/2015  . Constipation   . AICD (automatic cardioverter/defibrillator) present   . Pneumonia     when he was younger  . ESRD (end stage renal disease) on dialysis [facility])     Past  Surgical History  Procedure Laterality Date  . Coronary stent placement      Multivessel coronary intervention in 2007 and 2009. Ischemic cardiomyopathyPCI LAD/ RCA in 2007 by Dr. Dwyane Dee. PCI LAD/RCA in 2009 at [facility]; PCI LAD 08/2013. Presented with unstable angina pectoris.. Coronary angio demonstrated proximal and distal LAD lesions.75/20 PROMUS stent in proximal LAD// PTCA of distal LAD  . Cardiac defibrillator placement    . Spine surgery      anterior fusion  . Hernia repair    . Tee without cardioversion N/A 07/26/2014    Procedure: TRANSESOPHAGEAL ECHOCARDIOGRAM (TEE);  Surgeon: Larey Dresser, MD;  Location: [place];  Service: Cardiovascular;  Laterality: N/A;  . Cardioversion N/A 07/26/2014    Procedure: CARDIOVERSION;  Surgeon: Larey Dresser, MD;  Location: [place];  Service: Cardiovascular;  Laterality: N/A;  . Right heart catheterization N/A 07/25/2014    Procedure: RIGHT HEART CATH;  Surgeon: Jolaine Artist, MD;  Location: [facility] CATH LAB;  Service: Cardiovascular;  Laterality: N/A;  . Cardiac defibrillator placement  06/2014  . Insertion of dialysis catheter    . Eye surgery Bilateral     for burst blood vessel  . Cardiac catheterization  04/2015  . Coronary angioplasty    . Colonoscopy w/ polypectomy    . Finger fracture surgery Right     little finger  . Av fistula placement Left 08/03/2015    Procedure: ARTERIOVENOUS (AV) FISTULA CREATION VERSUS BASILIC VEIN TRANSPOSITION VERSUS ARTERIOVENOUS GRAFT INSERTION;  Surgeon: Angelia Mould, MD;  Location: [place];  Service: Vascular;  Laterality: Left;  . Peripheral vascular catheterization N/A 11/19/2015    Procedure: Fistulagram;  Surgeon: Angelia Mould, MD;  Location: [place] CV LAB;  Service: Cardiovascular;  Laterality: N/A;  . Tee without cardioversion N/A 01/02/2016    Procedure: TRANSESOPHAGEAL ECHOCARDIOGRAM (TEE);  Surgeon: Larey Dresser, MD;  Location: [place];  Service:  Cardiovascular;  Laterality: N/A;  . Cardioversion N/A 01/02/2016    Procedure: CARDIOVERSION;  Surgeon: Larey Dresser, MD;  Location: [place];  Service: Cardiovascular;  Laterality: N/A;    Social History:  reports that he has never smoked. He has never used smokeless tobacco. He reports that he does not drink alcohol or use illicit drugs.  Family History:  Family History  Problem Relation Age of Onset  . Heart disease      brother  . Diabetes Mother     Bilateral Leg  . Heart disease Mother   . Cancer Father     Right neck  . Kidney disease    . Diabetes Sister   . Hyperlipidemia Sister   . Hypertension Sister   . Diabetes Brother   . Hyperlipidemia Brother   . Hypertension Brother   . Heart attack Brother      Prior to Admission medications   Medication Sig Start Date End Date Taking? Authorizing Provider  acetaminophen (TYLENOL) 325 MG tablet Take 650 mg by mouth every 8 (eight) hours as needed for headache (pain).  Yes Historical Provider, MD  albuterol (PROVENTIL HFA;VENTOLIN HFA) 108 (90 BASE) MCG/ACT inhaler Inhale 2 puffs into the lungs every 6 (six) hours as needed for wheezing or shortness of breath.   Yes Historical Provider, MD  albuterol (PROVENTIL) (2.5 MG/3ML) 0.083% nebulizer solution Take 2.5 mg by nebulization every 6 (six) hours as needed for wheezing or shortness of breath.   Yes Historical Provider, MD  amiodarone (PACERONE) 200 MG tablet Take 100 mg by mouth daily.    Yes Historical Provider, MD  atorvastatin (LIPITOR) 40 MG tablet Take 1 tablet (40 mg total) by mouth daily. 12/12/15  Yes Will Meredith Leeds, MD  carvedilol (COREG) 6.25 MG tablet Take 1.5 tablets (9.375 mg total) by mouth 2 (two) times daily with a meal. 10/05/15  Yes Larey Dresser, MD  clopidogrel (PLAVIX) 75 MG tablet TAKE ONE TABLET BY MOUTH ONCE DAILY 12/12/15  Yes Larey Dresser, MD  docusate sodium (COLACE) 100 MG capsule Take 100 mg by mouth daily as needed (constipation).     Yes Historical Provider, MD  losartan (COZAAR) 25 MG tablet Take 1 tablet (25 mg total) by mouth daily. 11/16/15  Yes Larey Dresser, MD  mometasone-formoterol [facility]) 200-5 MCG/ACT AERO Inhale 2 puffs into the lungs 2 (two) times daily as needed for wheezing or shortness of breath.   Yes Historical Provider, MD  Multiple Vitamin (MULTIVITAMIN WITH MINERALS) TABS tablet Take 1 tablet by mouth daily.   Yes Historical Provider, MD  nitroGLYCERIN (NITROSTAT) 0.4 MG SL tablet Place 1 tablet (0.4 mg total) under the tongue every 5 (five) minutes as needed for chest pain. 10/02/15  Yes Larey Dresser, MD  nystatin ointment (MYCOSTATIN) Apply 1 application topically as needed. 01/07/16  Yes Historical Provider, MD  warfarin (COUMADIN) 5 MG tablet Take 2.5-5 mg by mouth as directed. Take 5 mg (1 tablet) on Mon, Wed, Fri and 2.5 mg (1/2 tablet) on Tues, Thurs, Sat, Sun   Yes Historical Provider, MD  enoxaparin (LOVENOX) 80 MG/0.8ML injection Inject 0.8 mLs (80 mg total) into the skin daily. Patient not taking: Reported on 01/10/2016 11/26/15   Larey Dresser, MD  insulin lispro (HUMALOG) 100 UNIT/ML injection Inject 10 Units into the skin 2 (two) times daily as needed for high blood sugar (CBG >180). Reported on 01/10/2016    Historical Provider, MD    Physical Exam: Filed Vitals:   01/10/16 2315 01/10/16 2330 01/10/16 2345 01/11/16 0026  BP: 115/76 109/71 110/67 108/63  Pulse: 73 69 70 71  Temp:    98.4 F (36.9 C)  TempSrc:    Oral  Resp: 17 20 22 19   Height:    5\' 6"  (1.676 m)  Weight:    79.742 kg (175 lb 12.8 oz)  SpO2: 95% 95% 93% 96%   General: Not in acute distress HEENT:       Eyes: PERRL, EOMI, no scleral icterus.       ENT: No discharge from the ears and nose, no pharynx injection, no tonsillar enlargement.        Neck: No JVD, no bruit, no mass felt. Heme: No neck lymph node enlargement. Cardiac: S1/S2, RRR, No murmurs, No gallops or rubs. Pulm: No rales, wheezing, rhonchi or  rubs. Abd: Soft, nondistended, nontender, no rebound pain, no organomegaly, BS present. Ext: No pitting leg edema bilaterally. 2+DP/PT pulse bilaterally. Musculoskeletal: No joint deformities, No joint redness or warmth, no limitation of ROM in spin. GU: There is an open sore on the  tip of penis which is dry without discharge. Skin: No rashes.  Neuro: Alert, oriented X3, cranial nerves II-XII grossly intact, moves all extremities normally. Psych: Patient is not psychotic, no suicidal or hemocidal ideation.  Labs on Admission:  Basic Metabolic Panel:  Recent Labs Lab 01/10/16 2005  NA 137  K 4.1  CL 93*  CO2 30  GLUCOSE 138*  BUN 14  CREATININE 3.71*  CALCIUM 9.6   Liver Function Tests: No results for input(s): AST, ALT, ALKPHOS, BILITOT, PROT, ALBUMIN in the last 168 hours. No results for input(s): LIPASE, AMYLASE in the last 168 hours. No results for input(s): AMMONIA in the last 168 hours. CBC:  Recent Labs Lab 01/10/16 2005  WBC 5.3  NEUTROABS 3.5  HGB 10.6*  HCT 32.7*  MCV 98.5  PLT 153   Cardiac Enzymes:  Recent Labs Lab 01/11/16 0010  TROPONINI 0.09*    BNP (last 3 results)  Recent Labs  01/10/16 2006  BNP 4360.3*    ProBNP (last 3 results) No results for input(s): PROBNP in the last 8760 hours.  CBG:  Recent Labs Lab 01/11/16 0020  GLUCAP 165*    Radiological Exams on Admission: Dg Chest 2 View  01/10/2016  CLINICAL DATA:  Shortness of breath EXAM: CHEST  2 VIEW COMPARISON:  March 30 26 FINDINGS: The heart size and mediastinal contours are stable. Heart size is enlarged. Cardiac pacemaker is unchanged. Dual lumen right central venous line are noted with distal tips in the superior vena cava. There is a moderate right pleural effusion with consolidation of right lung base. There is a small left pleural effusion. The visualized skeletal structures are stable. IMPRESSION: Moderate right pleural effusion with consolidation right lung base,  underlying pneumonia is not excluded. Small left pleural effusion. Electronically Signed   By: Abelardo Diesel M.D.   On: 01/10/2016 19:22    Assessment/Plan Principal Problem:   HCAP (healthcare-associated pneumonia) Active Problems:   Elevated troponin   DM (diabetes mellitus), type 2, uncontrolled, with renal complications (HCC)   Essential hypertension, benign   Chronic combined systolic and diastolic CHF (congestive heart failure) (HCC)   CAD (coronary artery disease)   PAF (paroxysmal atrial fibrillation) (HCC)   Atrial fibrillation (HCC)   HLD (hyperlipidemia)   ESRD (end stage renal disease) on dialysis ([place])   Ulcer of penis   Possible HCAP (healthcare-associated pneumonia): Patient's productive cough, shortness of breath and CXR findings are consistent with possible HCAP. He is not septic without fever, leukocytosis, tachypnea or tachycardia.  - Will admit to Telemetry Bed - IV Vancomycin and cefepime - IV Rocephin and azithromycin - Mucinex for cough  - Dulera, albuterol Neb prn for SOB - Urine legionella and S. pneumococcal antigen - Follow up blood culture x2, sputum culture and respiratory virus panel, plus Flu pcr  Ulcer on penis: Etiology is not clear. Differential diagnosis include Herpes, calciphylaxis and chancer -start acyclovir empirically -wound Cx -RPR, HIV ab and GC/chlamydia prob -prn percoct  Elevated troponin and CAD: s/p of stent. Troponin 0.09, no chest pain. Likely due to ESRD -trend trop -contnue coreg and lipitor -When necessary nitroglycerin  DM-II: Last A1c 7.3 on 01/24/15, fairly controled. Patient is taking Humalog prn at home -SSI -Check A1c  Hypertension: -Coreg, Cozaar  Chronic combined systolic and diastolic CHF (congestive heart failure) ([place]): 2-D echo on 11/29/15 showed EF 15-20 percent. Patient has AICD placement. No leg edema. Volume is managed by renal via dialysis. CHF is compensated. -volume management per renal -  Continue  Coreg and plavix  Atrial Fibrillation: CHA2DS2-VASc Score is 7, needs oral anticoagulation. Patient is on Coumadin at home. INR is  2.58 on admission. Heart rate is well controlled. -Continue Coreg and amiodarone -Continue Coumadin per pharmacy  HLD: Last LDL was 64 on 11/16/15 -Continue home medications: Lipitor  ESRD-HD (TTS): Creatinine is 3.71, BUN 14. Patient had dialysis on 01/09/26 -Left message to renal for HD  DVT ppx: on coumdain  Code Status: Full code Family Communication: None at bed side.   Disposition Plan: Admit to inpatient   Date of Service 01/11/2016    Ivor Costa [facility] Pager [phone]  If 7PM-7AM, please contact night-coverage [email or web address] Password [facility] 01/11/2016, 2:37 AM

## 2016-01-10 NOTE — Progress Notes (Signed)
ANTICOAGULATION CONSULT NOTE - Initial Consult  Pharmacy Consult for Warfarin  Indication: atrial fibrillation  No Known Allergies   Vital Signs: Temp: 98.3 F (36.8 C) (03/16 1820) Temp Source: Oral (03/16 1820) BP: 100/61 mmHg (03/16 2300) Pulse Rate: 70 (03/16 2300)  Labs:  Recent Labs  01/09/16 0844 01/10/16 2005 01/10/16 2110  HGB  --  10.6*  --   HCT  --  32.7*  --   PLT  --  153  --   LABPROT  --   --  27.3*  INR 2.7  --  2.58*  CREATININE  --  3.71*  --     CrCl cannot be calculated (Unknown ideal weight.).   Medical History: Past Medical History  Diagnosis Date  . Systolic and diastolic CHF, chronic ([place])     a. Echo at [facility] 11/17/2013 Ef <25%, severe hypokinesis of LV, moderately dilated LA, grade IV/IV diastolic dysfunction with irreversible restrictive pattern of mitral inflow, severe pulm HTN (RVSP >17mmHg), 1-2+ MR  b. Echo at [facility] 04/02/14 EF 25-30%, moderate concentric LVH, diffuse hypokinesis, moderately dilated LA, no significant MR observed  . CAD (coronary artery disease)     a. PCI LAD/RCA in 2007  b. PCI LAD/RCA in 2009 at [facility]  c. PCI LAD 08/2013, CTO of RCA and nonobstructive disease in the LCx.  Marland Kitchen Hyperlipidemia   . Dementia   . Ischemic cardiomyopathy     a. s/p Biotronik ICD 06/2014.  Marland Kitchen Retinopathy     bilateral  . HTN (hypertension)   . Abnormal renal ultrasound     a. 03/2014: Abnormal renal ultrasound with left renal lesion and bladder wall thickening and microscopic hematuria. Multiple cysts on prior MRI. Given inability to use contrast, f/u imaging limited, repeat US 3 months with outpatient uro f/u.  Marland Kitchen Paroxysmal atrial fibrillation (HCC)   . Paroxysmal atrial flutter ([place])   . Anemia   . Myocardial infarction [facility]) July 2016    Heart attack  . Peripheral vascular disease ([place])   . Stroke [facility])     right arm weakness  . Asthma   . CKD (chronic kidney disease), stage IV ([place])     on dialysis T/TH/Sa  . Diabetes  mellitus with nephropathy ([place])     type 2  . H/O cardiac arrest 04/2015  . Constipation   . AICD (automatic cardioverter/defibrillator) present   . Pneumonia     when he was younger  . ESRD (end stage renal disease) on dialysis [facility])     Assessment: 65 y/o M here with penile soreness/swelling, warfarin PTA for afib, INR is good at 2.58 on admit, other labs reviewed.   Warfarin PTA dose is 5 mg MWF, 2.5 mg all other days, last dose 3/15  Goal of Therapy:  INR 2-3 Monitor platelets by anticoagulation protocol: Yes   Plan: -Warfarin 5 mg MWF, 2.5 mg all other days -Daily PT/INR -Monitor for bleeding  Narda Bonds 01/10/2016,11:32 PM

## 2016-01-11 ENCOUNTER — Inpatient Hospital Stay (HOSPITAL_COMMUNITY): Payer: Medicare HMO

## 2016-01-11 LAB — INFLUENZA PANEL BY PCR (TYPE A & B)
H1N1FLUPCR: NOT DETECTED
INFLAPCR: NEGATIVE
Influenza B By PCR: NEGATIVE

## 2016-01-11 LAB — URINALYSIS, ROUTINE W REFLEX MICROSCOPIC
Glucose, UA: NEGATIVE mg/dL
Ketones, ur: 15 mg/dL — AB
Nitrite: POSITIVE — AB
PH: 5 (ref 5.0–8.0)
Protein, ur: >300 mg/dL — AB
SPECIFIC GRAVITY, URINE: 1.026 (ref 1.005–1.030)

## 2016-01-11 LAB — GLUCOSE, CAPILLARY
GLUCOSE-CAPILLARY: 143 mg/dL — AB (ref 65–99)
GLUCOSE-CAPILLARY: 165 mg/dL — AB (ref 65–99)
GLUCOSE-CAPILLARY: 82 mg/dL (ref 65–99)
Glucose-Capillary: 134 mg/dL — ABNORMAL HIGH (ref 65–99)
Glucose-Capillary: 98 mg/dL (ref 65–99)

## 2016-01-11 LAB — RENAL FUNCTION PANEL
Albumin: 3.4 g/dL — ABNORMAL LOW (ref 3.5–5.0)
Anion gap: 18 — ABNORMAL HIGH (ref 5–15)
BUN: 29 mg/dL — ABNORMAL HIGH (ref 6–20)
CO2: 23 mmol/L (ref 22–32)
Calcium: 9.6 mg/dL (ref 8.9–10.3)
Chloride: 94 mmol/L — ABNORMAL LOW (ref 101–111)
Creatinine, Ser: 5.23 mg/dL — ABNORMAL HIGH (ref 0.61–1.24)
GFR calc Af Amer: 12 mL/min — ABNORMAL LOW (ref >60)
GFR calc non Af Amer: 11 mL/min — ABNORMAL LOW (ref >60)
Glucose, Bld: 78 mg/dL (ref 65–99)
Phosphorus: 5.4 mg/dL — ABNORMAL HIGH (ref 2.5–4.6)
Potassium: 4.4 mmol/L (ref 3.5–5.1)
Sodium: 135 mmol/L (ref 135–145)

## 2016-01-11 LAB — MRSA PCR SCREENING: MRSA by PCR: NEGATIVE

## 2016-01-11 LAB — URINE MICROSCOPIC-ADD ON

## 2016-01-11 LAB — RPR: RPR Ser Ql: NONREACTIVE

## 2016-01-11 LAB — CBC
HCT: 30.5 % — ABNORMAL LOW (ref 39.0–52.0)
Hemoglobin: 10.1 g/dL — ABNORMAL LOW (ref 13.0–17.0)
MCH: 32.1 pg (ref 26.0–34.0)
MCHC: 33.1 g/dL (ref 30.0–36.0)
MCV: 96.8 fL (ref 78.0–100.0)
Platelets: 93 10*3/uL — ABNORMAL LOW (ref 150–400)
RBC: 3.15 MIL/uL — ABNORMAL LOW (ref 4.22–5.81)
RDW: 15.4 % (ref 11.5–15.5)
WBC: 4.9 10*3/uL (ref 4.0–10.5)

## 2016-01-11 LAB — HIV ANTIBODY (ROUTINE TESTING W REFLEX): HIV SCREEN 4TH GENERATION: NONREACTIVE

## 2016-01-11 LAB — TROPONIN I
TROPONIN I: 0.09 ng/mL — AB (ref <0.031)
TROPONIN I: 0.1 ng/mL — AB (ref <0.031)
Troponin I: 0.1 ng/mL — ABNORMAL HIGH (ref <0.031)

## 2016-01-11 MED ORDER — SODIUM CHLORIDE 0.9 % IV SOLN: 100.0000 mL | INTRAVENOUS | Status: DC | PRN

## 2016-01-11 MED ORDER — NA FERRIC GLUC CPLX IN SUCROSE 12.5 MG/ML IV SOLN
62.5000 mg | INTRAVENOUS | Status: DC
  Administered 2016-01-17: 62.5 mg via INTRAVENOUS
  Filled 2016-01-11 (×2): qty 5

## 2016-01-11 MED ORDER — PENTAFLUOROPROP-TETRAFLUOROETH EX AERO: 1.0000 "application " | INHALATION_SPRAY | CUTANEOUS | Status: DC | PRN

## 2016-01-11 MED ORDER — HEPARIN SODIUM (PORCINE) 1000 UNIT/ML DIALYSIS: 20.0000 [IU]/kg | INTRAMUSCULAR | Status: DC | PRN

## 2016-01-11 MED ORDER — LIDOCAINE-PRILOCAINE 2.5-2.5 % EX CREA: 1.0000 "application " | TOPICAL_CREAM | CUTANEOUS | Status: DC | PRN

## 2016-01-11 MED ORDER — NEPRO/CARBSTEADY PO LIQD
237.0000 mL | Freq: Two times a day (BID) | ORAL | Status: DC
  Administered 2016-01-11 – 2016-01-19 (×10): 237 mL via ORAL

## 2016-01-11 MED ORDER — ALTEPLASE 2 MG IJ SOLR: 2.0000 mg | Freq: Once | INTRAMUSCULAR | Status: DC | PRN

## 2016-01-11 MED ORDER — MORPHINE SULFATE (PF) 2 MG/ML IV SOLN
0.5000 mg | INTRAVENOUS | Status: DC | PRN
  Administered 2016-01-11 – 2016-01-13 (×2): 0.5 mg via INTRAVENOUS
  Filled 2016-01-11 (×2): qty 1

## 2016-01-11 MED ORDER — PNEUMOCOCCAL VAC POLYVALENT 25 MCG/0.5ML IJ INJ
0.5000 mL | INJECTION | INTRAMUSCULAR | Status: AC
  Administered 2016-01-13: 0.5 mL via INTRAMUSCULAR
  Filled 2016-01-11: qty 1
  Filled 2016-01-11: qty 0.5

## 2016-01-11 MED ORDER — HEPARIN SODIUM (PORCINE) 1000 UNIT/ML DIALYSIS: 1000.0000 [IU] | INTRAMUSCULAR | Status: DC | PRN

## 2016-01-11 MED ORDER — LIDOCAINE HCL (PF) 1 % IJ SOLN: 5.0000 mL | INTRAMUSCULAR | Status: DC | PRN

## 2016-01-11 MED ORDER — LANTHANUM CARBONATE 500 MG PO CHEW
1000.0000 mg | CHEWABLE_TABLET | Freq: Three times a day (TID) | ORAL | Status: DC
  Administered 2016-01-11 – 2016-01-19 (×21): 1000 mg via ORAL
  Filled 2016-01-11 (×21): qty 2

## 2016-01-11 NOTE — Care Management (Signed)
CM will follow up with patient in the am.

## 2016-01-11 NOTE — Discharge Instructions (Signed)

## 2016-01-11 NOTE — Progress Notes (Signed)
[facility] PROGRESS NOTE  [name] Freeman DOB: 16-Jun-1951 DOA: 01/10/2016 PCP: Tanya Nones, MD   Interim summary Tony Freeman is a 65 y.o. male with PMH of ESRD-HD, hypertension, hyperlipidemia, diabetes mellitus, CAD, S/P stent placement, AICD, chronic systolic and diastolic congestive heart failure with EF 15-20 percent, dementia, PAF on Coumadin, PVD, stroke, who presents with shortness of breath and penis ulcer. He was admitted for possible health care associated pneumonia and penile ulcer.   Assessment/Plan: 1. Health care associated pneumonia:  -started on vancomycin and cefepime, not requiring oxygen.  - blood cultures done and pending.  Sputum culture not available.  - influenza PCR is negative.  - normal wbc count.  - urine for streptococcal antigen pending.   2. Penile ulcer: - apparently he waas given nystatin without much improvement.  - started on acyclovir last night,  - HIV antibody is non reactive. RPR is non reactive.  - cultures done and pending.   3. Chronic compensated systolic and diastolic heart failure: Resume home meds of coreg and plavix.    Atrial fibrillation: rate controlled. And on coumadin.  Therapeutic INR.    Hypertension: controlled.    Diabetes Mellitus: CBG (last 3)   Recent Labs  01/11/16 0747 01/11/16 1203 01/11/16 1718  GLUCAP 82 134* 98    Resume SSI.  hgba1c is pending.    ESRD ON HD TTS: Renal consulted and plan for HD in am.     Code Status: full code.  Family Communication: none at bedside Disposition Plan: pending improvement of the pneumonia and penile ulcer.    Consultants:  none  Procedures:  none  Antibiotics:  Acyclovir.   HPI/Subjective: Reports pain from the ulcer is controlled.    Objective: Filed Vitals:   01/11/16 0937 01/11/16 1723  BP: 110/69 118/71  Pulse: 65 68  Temp: 98.6 F (37 C) 98.6 F (37 C)  Resp: 18 18    Intake/Output  Summary (Last 24 hours) at 01/11/16 1831 Last data filed at 01/11/16 1300  Gross per 24 hour  Intake    720 ml  Output      0 ml  Net    720 ml   Filed Weights   01/11/16 0026  Weight: 79.742 kg (175 lb 12.8 oz)    Exam:   General:  Alert comfortable.   Cardiovascular: s1s2, RRR,  Respiratory: clear to auscultation, no wheezing or rhonchi  Abdomen: soft non tender non distended bowel sounds heard.  Musculoskeletal: no pedal edema.   Data Reviewed: Basic Metabolic Panel:  Recent Labs Lab 01/10/16 2005  NA 137  K 4.1  CL 93*  CO2 30  GLUCOSE 138*  BUN 14  CREATININE 3.71*  CALCIUM 9.6   Liver Function Tests: No results for input(s): AST, ALT, ALKPHOS, BILITOT, PROT, ALBUMIN in the last 168 hours. No results for input(s): LIPASE, AMYLASE in the last 168 hours. No results for input(s): AMMONIA in the last 168 hours. CBC:  Recent Labs Lab 01/10/16 2005  WBC 5.3  NEUTROABS 3.5  HGB 10.6*  HCT 32.7*  MCV 98.5  PLT 153   Cardiac Enzymes:  Recent Labs Lab 01/11/16 0010 01/11/16 0716 01/11/16 0838  TROPONINI 0.09* 0.10* 0.10*   BNP (last 3 results)  Recent Labs  01/10/16 2006  BNP 4360.3*    ProBNP (last 3 results) No results for input(s): PROBNP in the last 8760 hours.  CBG:  Recent Labs Lab 01/11/16 0020 01/11/16 0747 01/11/16 1203 01/11/16 1718  GLUCAP 165* 82 134* 98    Recent Results (from the past 240 hour(s))  MRSA PCR Screening     Status: None   Collection Time: 01/11/16 12:40 AM  Result Value Ref Range Status   MRSA by PCR NEGATIVE NEGATIVE Final    Comment:        The GeneXpert MRSA Assay (FDA approved for NASAL specimens only), is one component of a comprehensive MRSA colonization surveillance program. It is not intended to diagnose MRSA infection nor to guide or monitor treatment for MRSA infections.      Studies: Dg Chest 2 View  01/10/2016  CLINICAL DATA:  Shortness of breath EXAM: CHEST  2 VIEW  COMPARISON:  March 30 26 FINDINGS: The heart size and mediastinal contours are stable. Heart size is enlarged. Cardiac pacemaker is unchanged. Dual lumen right central venous line are noted with distal tips in the superior vena cava. There is a moderate right pleural effusion with consolidation of right lung base. There is a small left pleural effusion. The visualized skeletal structures are stable. IMPRESSION: Moderate right pleural effusion with consolidation right lung base, underlying pneumonia is not excluded. Small left pleural effusion. Electronically Signed   By: Abelardo Diesel M.D.   On: 01/10/2016 19:22    Scheduled Meds: . acyclovir  200 mg Intravenous Q24H  . amiodarone  100 mg Oral Daily  . atorvastatin  40 mg Oral Daily  . carvedilol  9.375 mg Oral BID WC  . [START ON 01/12/2016] ceFEPime (MAXIPIME) IV  2 g Intravenous Q T,Th,Sat-1800  . clopidogrel  75 mg Oral Daily  . dextromethorphan-guaiFENesin  1 tablet Oral BID  . feeding supplement (NEPRO CARB STEADY)  237 mL Oral BID BM  . [START ON 01/17/2016] ferric gluconate (FERRLECIT/NULECIT) IV  62.5 mg Intravenous Q Thu-HD  . insulin aspart  0-5 Units Subcutaneous QHS  . insulin aspart  0-9 Units Subcutaneous TID WC  . lanthanum  1,000 mg Oral TID WC  . losartan  25 mg Oral Daily  . mometasone-formoterol  2 puff Inhalation BID  . multivitamin with minerals  1 tablet Oral Daily  . [START ON 01/12/2016] pneumococcal 23 valent vaccine  0.5 mL Intramuscular Tomorrow-1000  . [START ON 01/12/2016] vancomycin  1,000 mg Intravenous Q T,Th,Sa-HD  . warfarin  2.5 mg Oral Q T,Th,S,Su-1800  . warfarin  5 mg Oral Q M,W,F-1800  . Warfarin - Pharmacist Dosing Inpatient   Does not apply q1800   Continuous Infusions:   Principal Problem:   HCAP (healthcare-associated pneumonia) Active Problems:   Elevated troponin   DM (diabetes mellitus), type 2, uncontrolled, with renal complications (HCC)   Essential hypertension, benign   Chronic combined  systolic and diastolic CHF (congestive heart failure) (HCC)   CAD (coronary artery disease)   PAF (paroxysmal atrial fibrillation) (HCC)   Atrial fibrillation (HCC)   HLD (hyperlipidemia)   ESRD (end stage renal disease) on dialysis ([place])   Ulcer of penis    Time spent: 30 minutes.     [place] [facility] Pager [phone]. If 7PM-7AM, please contact night-coverage at [email or web address], password [facility] 01/11/2016, 6:31 PM  LOS: 1 day

## 2016-01-11 NOTE — Progress Notes (Signed)
Utilization Review Completed.  

## 2016-01-11 NOTE — Consult Note (Signed)
[facility] Renal Consultation Note    Indication for Consultation:  Management of ESRD/hemodialysis; anemia, hypertension/volume and secondary hyperparathyroidism PCP: Tanya Nones, MD   HPI: Tony Freeman is a 65 y.o. male with ESRD (TTS AF), HTN, DM, CAD hx stent, AICD, CHF with EF 15-20%, PAF on coumadin, hx CVA who presented to the ED yesterday evening post HD with complaint of SOB and penis ulcer that started as a blister last week and is painful.  He reports cough and SOB x 2 weeks with greenish sputum (none of which was mentioned to the rounding physician at his dialysis unit yesterday, though he did mention the penile ulcer; he said Dr. Marval Regal was going to refer him to urology). He had a cardioversion 01/02/2016.  His daughter in-law also reported that he cut his feet recently while trimming his toenails. He was afebrile pre and post HD Thursday with a neg UF of 2.2 L and post HD wt of 79.3 (EDW 79.5).  Post HD BP was 90s/50s sitting and standing P 80s which is usual for him.  Pre HD systolic BPs sent [id number] (usually closer to 100).  EKG showed ST changes and troponin 0.09- 1.0 CXR showed ? RLL consolidation and moderate pleural effusion.  He has a cough, WBC and diff are unremarkable. He is afebrile and presenting BP in the 90s is not unusual for him post HD.   He has no N, V, D, fever or chills. Last BM yesterday. Appetite is fair.  Past Medical History  Diagnosis Date  . Systolic and diastolic CHF, chronic ([place])     a. Echo at [facility] 11/17/2013 Ef <25%, severe hypokinesis of LV, moderately dilated LA, grade IV/IV diastolic dysfunction with irreversible restrictive pattern of mitral inflow, severe pulm HTN (RVSP >45mmHg), 1-2+ MR  b. Echo at [facility] 04/02/14 EF 25-30%, moderate concentric LVH, diffuse hypokinesis, moderately dilated LA, no significant MR observed  . CAD (coronary artery disease)     a. PCI LAD/RCA in 2007  b. PCI LAD/RCA in 2009 at [facility]   c. PCI LAD 08/2013, CTO of RCA and nonobstructive disease in the LCx.  Marland Kitchen Hyperlipidemia   . Dementia   . Ischemic cardiomyopathy     a. s/p Biotronik ICD 06/2014.  Marland Kitchen Retinopathy     bilateral  . HTN (hypertension)   . Abnormal renal ultrasound     a. 03/2014: Abnormal renal ultrasound with left renal lesion and bladder wall thickening and microscopic hematuria. Multiple cysts on prior MRI. Given inability to use contrast, f/u imaging limited, repeat US 3 months with outpatient uro f/u.  Marland Kitchen Paroxysmal atrial fibrillation (HCC)   . Paroxysmal atrial flutter ([place])   . Anemia   . Myocardial infarction [facility]) July 2016    Heart attack  . Peripheral vascular disease ([place])   . Stroke [facility])     right arm weakness  . Asthma   . CKD (chronic kidney disease), stage IV ([place])     on dialysis T/TH/Sa  . 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 H/O cardiac arrest 04/2015  . Constipation   . AICD (automatic cardioverter/defibrillator) present   . Pneumonia     when he was younger  . ESRD (end stage renal disease) on dialysis [facility])    Past Surgical History  Procedure Laterality Date  . Coronary stent placement      Multivessel coronary intervention in 2007 and 2009. Ischemic cardiomyopathyPCI LAD/ RCA  in 2007 by Dr. Dwyane Dee. PCI LAD/RCA in 2009 at [facility]; PCI LAD 08/2013. Presented with unstable angina pectoris.. Coronary angio demonstrated proximal and distal LAD lesions.17/20 PROMUS stent in proximal LAD// PTCA of distal LAD  . Cardiac defibrillator placement    . Spine surgery      anterior fusion  . Hernia repair    . Tee without cardioversion N/A 07/26/2014    Procedure: TRANSESOPHAGEAL ECHOCARDIOGRAM (TEE);  Surgeon: Larey Dresser, MD;  Location: [place];  Service: Cardiovascular;  Laterality: N/A;  . Cardioversion N/A 07/26/2014    Procedure: CARDIOVERSION;  Surgeon: Larey Dresser, MD;  Location: [place];  Service: Cardiovascular;  Laterality: N/A;  .  Right heart catheterization N/A 07/25/2014    Procedure: RIGHT HEART CATH;  Surgeon: Jolaine Artist, MD;  Location: [facility] CATH LAB;  Service: Cardiovascular;  Laterality: N/A;  . Cardiac defibrillator placement  06/2014  . Insertion of dialysis catheter    . Eye surgery Bilateral     for burst blood vessel  . Cardiac catheterization  04/2015  . Coronary angioplasty    . Colonoscopy w/ polypectomy    . Finger fracture surgery Right     little finger  . Av fistula placement Left 08/03/2015    Procedure: ARTERIOVENOUS (AV) FISTULA CREATION VERSUS BASILIC VEIN TRANSPOSITION VERSUS ARTERIOVENOUS GRAFT INSERTION;  Surgeon: Angelia Mould, MD;  Location: [place];  Service: Vascular;  Laterality: Left;  . Peripheral vascular catheterization N/A 11/19/2015    Procedure: Fistulagram;  Surgeon: Angelia Mould, MD;  Location: [place] CV LAB;  Service: Cardiovascular;  Laterality: N/A;  . Tee without cardioversion N/A 01/02/2016    Procedure: TRANSESOPHAGEAL ECHOCARDIOGRAM (TEE);  Surgeon: Larey Dresser, MD;  Location: [place];  Service: Cardiovascular;  Laterality: N/A;  . Cardioversion N/A 01/02/2016    Procedure: CARDIOVERSION;  Surgeon: Larey Dresser, MD;  Location: [facility] ENDOSCOPY;  Service: Cardiovascular;  Laterality: N/A;   Family History  Problem Relation Age of Onset  . Heart disease      brother  . Diabetes Mother     Bilateral Leg  . Heart disease Mother   . Cancer Father     Right neck  . Kidney disease    . Diabetes Sister   . Hyperlipidemia Sister   . Hypertension Sister   . Diabetes Brother   . Hyperlipidemia Brother   . Hypertension Brother   . Heart attack Brother    Social History:  reports that he has never smoked. He has never used smokeless tobacco. He reports that he does not drink alcohol or use illicit drugs. No Known Allergies Prior to Admission medications   Medication Sig Start Date End Date Taking? Authorizing Provider  acetaminophen (TYLENOL)  325 MG tablet Take 650 mg by mouth every 8 (eight) hours as needed for headache (pain).    Yes Historical Provider, MD  albuterol (PROVENTIL HFA;VENTOLIN HFA) 108 (90 BASE) MCG/ACT inhaler Inhale 2 puffs into the lungs every 6 (six) hours as needed for wheezing or shortness of breath.   Yes Historical Provider, MD  albuterol (PROVENTIL) (2.5 MG/3ML) 0.083% nebulizer solution Take 2.5 mg by nebulization every 6 (six) hours as needed for wheezing or shortness of breath.   Yes Historical Provider, MD  amiodarone (PACERONE) 200 MG tablet Take 100 mg by mouth daily.    Yes Historical Provider, MD  atorvastatin (LIPITOR) 40 MG tablet Take 1 tablet (40 mg total) by mouth daily. 12/12/15  Yes Will Hassell Done  Camnitz, MD  carvedilol (COREG) 6.25 MG tablet Take 1.5 tablets (9.375 mg total) by mouth 2 (two) times daily with a meal. 10/05/15  Yes Larey Dresser, MD  clopidogrel (PLAVIX) 75 MG tablet TAKE ONE TABLET BY MOUTH ONCE DAILY 12/12/15  Yes Larey Dresser, MD  docusate sodium (COLACE) 100 MG capsule Take 100 mg by mouth daily as needed (constipation).    Yes Historical Provider, MD  losartan (COZAAR) 25 MG tablet Take 1 tablet (25 mg total) by mouth daily. 11/16/15  Yes Larey Dresser, MD  mometasone-formoterol [facility]) 200-5 MCG/ACT AERO Inhale 2 puffs into the lungs 2 (two) times daily as needed for wheezing or shortness of breath.   Yes Historical Provider, MD  Multiple Vitamin (MULTIVITAMIN WITH MINERALS) TABS tablet Take 1 tablet by mouth daily.   Yes Historical Provider, MD  nitroGLYCERIN (NITROSTAT) 0.4 MG SL tablet Place 1 tablet (0.4 mg total) under the tongue every 5 (five) minutes as needed for chest pain. 10/02/15  Yes Larey Dresser, MD  nystatin ointment (MYCOSTATIN) Apply 1 application topically as needed. 01/07/16  Yes Historical Provider, MD  warfarin (COUMADIN) 5 MG tablet Take 2.5-5 mg by mouth as directed. Take 5 mg (1 tablet) on Mon, Wed, Fri and 2.5 mg (1/2 tablet) on Tues, Thurs, Sat, Sun    Yes Historical Provider, MD  enoxaparin (LOVENOX) 80 MG/0.8ML injection Inject 0.8 mLs (80 mg total) into the skin daily. Patient not taking: Reported on 01/10/2016 11/26/15   Larey Dresser, MD  insulin lispro (HUMALOG) 100 UNIT/ML injection Inject 10 Units into the skin 2 (two) times daily as needed for high blood sugar (CBG >180). Reported on 01/10/2016    Historical Provider, MD   Current Facility-Administered Medications  Medication Dose Route Frequency Provider Last Rate Last Dose  . acyclovir (ZOVIRAX) 200 mg in dextrose 5 % 100 mL IVPB  200 mg Intravenous Q24H Erenest Blank, RPH      . albuterol (PROVENTIL) (2.5 MG/3ML) 0.083% nebulizer solution 2.5 mg  2.5 mg Nebulization Q4H PRN Ivor Costa, MD      . amiodarone (PACERONE) tablet 100 mg  100 mg Oral Daily Ivor Costa, MD   100 mg at 01/11/16 0908  . atorvastatin (LIPITOR) tablet 40 mg  40 mg Oral Daily Ivor Costa, MD   40 mg at 01/11/16 0908  . carvedilol (COREG) tablet 9.375 mg  9.375 mg Oral BID WC Ivor Costa, MD   9.375 mg at 01/11/16 0909  . [START ON 01/12/2016] ceFEPIme (MAXIPIME) 2 g in dextrose 5 % 50 mL IVPB  2 g Intravenous Q T,Th,Sat-1800 Erenest Blank, RPH      . clopidogrel (PLAVIX) tablet 75 mg  75 mg Oral Daily Ivor Costa, MD   75 mg at 01/11/16 0908  . dextromethorphan-guaiFENesin (MUCINEX DM) 30-600 MG per 12 hr tablet 1 tablet  1 tablet Oral BID Ivor Costa, MD   1 tablet at 01/11/16 0909  . docusate sodium (COLACE) capsule 100 mg  100 mg Oral Daily PRN Ivor Costa, MD      . feeding supplement (NEPRO CARB STEADY) liquid 237 mL  237 mL Oral BID BM Hosie Poisson, MD   237 mL at 01/11/16 0914  . insulin aspart (novoLOG) injection 0-5 Units  0-5 Units Subcutaneous QHS Ivor Costa, MD   0 Units at 01/11/16 0020  . insulin aspart (novoLOG) injection 0-9 Units  0-9 Units Subcutaneous TID WC Ivor Costa, MD   1 Units at 01/11/16  1237  . losartan (COZAAR) tablet 25 mg  25 mg Oral Daily Ivor Costa, MD   25 mg at 01/11/16 0908  .  mometasone-formoterol (DULERA) 200-5 MCG/ACT inhaler 2 puff  2 puff Inhalation BID Ivor Costa, MD   2 puff at 01/11/16 [phone]  . morphine 2 MG/ML injection 0.5 mg  0.5 mg Intravenous Q4H PRN Hosie Poisson, MD   0.5 mg at 01/11/16 1254  . multivitamin with minerals tablet 1 tablet  1 tablet Oral Daily Ivor Costa, MD   1 tablet at 01/11/16 0908  . nitroGLYCERIN (NITROSTAT) SL tablet 0.4 mg  0.4 mg Sublingual Q5 min PRN Ivor Costa, MD      . oxyCODONE-acetaminophen (PERCOCET/ROXICET) 5-325 MG per tablet 1 tablet  1 tablet Oral Q4H PRN Ivor Costa, MD   1 tablet at 01/11/16 0909  . [START ON 01/12/2016] pneumococcal 23 valent vaccine (PNU-IMMUNE) injection 0.5 mL  0.5 mL Intramuscular Tomorrow-1000 Ivor Costa, MD      . Derrill Memo ON 01/12/2016] vancomycin (VANCOCIN) IVPB 1000 mg/200 mL premix  1,000 mg Intravenous Q T,Th,Sa-HD Erenest Blank, RPH      . warfarin (COUMADIN) tablet 2.5 mg  2.5 mg Oral Q T,Th,S,Su-1800 Erenest Blank, RPH   2.5 mg at 01/11/16 0102  . warfarin (COUMADIN) tablet 5 mg  5 mg Oral Q M,W,F-1800 Erenest Blank, RPH      . Warfarin - Pharmacist Dosing Inpatient   Does not apply [place], RPH       Facility-Administered Medications Ordered in Other Encounters  Medication Dose Route Frequency Provider Last Rate Last Dose  . 0.9 %  sodium chloride infusion   Intravenous Continuous Angelia Mould, MD      . Chlorhexidine Gluconate Cloth 2 % PADS 6 each  6 each Topical Once Angelia Mould, MD      . insulin aspart (novoLOG) injection 10 Units  10 Units Intravenous Once Angelia Mould, MD       Labs: Basic Metabolic Panel:  Recent Labs Lab 01/10/16 2005  NA 137  K 4.1  CL 93*  CO2 30  GLUCOSE 138*  BUN 14  CREATININE 3.71*  CALCIUM 9.6   CBC:  Recent Labs Lab 01/10/16 2005  WBC 5.3  NEUTROABS 3.5  HGB 10.6*  HCT 32.7*  MCV 98.5  PLT 153   Cardiac Enzymes:  Recent Labs Lab 01/11/16 0010 01/11/16 0716 01/11/16 0838  TROPONINI 0.09*  0.10* 0.10*   CBG:  Recent Labs Lab 01/11/16 0020 01/11/16 0747 01/11/16 1203  GLUCAP 165* 82 134*   Studies/Results: Dg Chest 2 View  01/10/2016  CLINICAL DATA:  Shortness of breath EXAM: CHEST  2 VIEW COMPARISON:  March 30 26 FINDINGS: The heart size and mediastinal contours are stable. Heart size is enlarged. Cardiac pacemaker is unchanged. Dual lumen right central venous line are noted with distal tips in the superior vena cava. There is a moderate right pleural effusion with consolidation of right lung base. There is a small left pleural effusion. The visualized skeletal structures are stable. IMPRESSION: Moderate right pleural effusion with consolidation right lung base, underlying pneumonia is not excluded. Small left pleural effusion. Electronically Signed   By: Abelardo Diesel M.D.   On: 01/10/2016 19:22    ROS: As per HPI otherwise negative.  Physical Exam: Filed Vitals:   01/11/16 0026 01/11/16 0458 01/11/16 0832 01/11/16 0937  BP: 108/63 112/63  110/69  Pulse: 71 61  65  Temp: 98.4  F (36.9 C) 98.3 F (36.8 C)  98.6 F (37 C)  TempSrc: Oral Oral  Oral  Resp: 19 17  18   Height: 5\' 6"  (1.676 m)     Weight: 79.742 kg (175 lb 12.8 oz)     SpO2: 96% 94% 94% 96%     General: Well developed, well nourished, in no acute distress. Head: Normocephalic, atraumatic, sclera non-icteric, mucus membranes are moist Neck: Supple. JVD not elevated. Lungs: Clear left crackles right base without wheezes. Breathing is unlabored on room air. Heart: RRR with S1 S2. No murmurs, rubs, or gallops appreciated. Abdomen: Soft, non-tender, non-distended with normoactive bowel sounds. No rebound/guarding. No obvious abdominal masses. G-U - dry ulceratiion meatus - about 1.5 cm M-S:  Strength and tone appear normal for age. Lower extremities:without edema or ischemic changes, right great toenail some dried blood but no overt evidence of infection Neuro: Alert and oriented . Moves all extremities  spontaneously. Psych:  Responds to questions appropriately with a normal affect, though vague on details. Dialysis Access:right IJ and nonmaturing left  AVF   Dialysis Orders:  AF TTS 4.25 hr 400/800 EDW 79.5 2 K 2.25 Ca profile 4 heparin 2600 venofer 50 3/16 Mircera 75 q 2 weeks 3/14 Recent labs;  hgb 11.2 3/2 22% sat INR 1.85 2/23 P 5.7 Corr Ca 9.7 iPTH 95 - no VDRA; Access: right IJ and nonmaturing left upper AVF -has plans for placement of right upper AVF  Assessment/Plan: 1. probabl HCAP- influenza neg; resp virus pending, blood cultures pending; abtx per primary 2. ESRD -  TTS - HD Saturday ; for right upper AVF placement in future due to non maturing left upper AVF 3. Hypertension/volume  - BP controlled - leaving slightly below edw - challenge slightly as BP allows due to right pleural effusion (seen on CXR post HD 3/16); outpt HD unit meds are not current - they need to be updated at discharge; if bp remains low, would stop losartan (had been on lisinopril but changed to losartan several weeks ago) 4. Anemia  - Hgb 10.6 - continue Fe; next dose ESA due 3/28 if still here 5. Metabolic bone disease -  Continue binders (fosrenol)- no VDRA 6. Nutrition - renal/carb mod, vitamins 7. Afib - on amiodarone/coumadin/low dose BB; recetn cardioversion 01/02/2016 - Dr. Aundra Dubin 8. CAD hx stent toponins - on plavix, asa, BB troponins 0.09 - 0.1- per primary 9. Depression - on remeron 10. DM/HLD - per primary 11. CHF - EF 15-20% 3.8.2017 TEE 12. Penile ulcer - defer to primary 13. COPD - quit smoking about 4 years ago - meds per primary  [name], PA-C [place] [phone] 01/11/2016, 1:52 PM    Renal Attending: I agree with note above, the penile ulceration looks syphilitic but RPR negative.  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  Will do pelvic xray and check phos  Marlisa Caridi C

## 2016-01-11 NOTE — Progress Notes (Signed)
ANTICOAGULATION CONSULT NOTE   Pharmacy Consult for Warfarin  Indication: atrial fibrillation  No Known Allergies   Vital Signs: Temp: 98.6 F (37 C) (03/17 0937) Temp Source: Oral (03/17 0937) BP: 110/69 mmHg (03/17 0937) Pulse Rate: 65 (03/17 0937)  Labs:  Recent Labs  01/09/16 0844 01/10/16 2005 01/10/16 2110 01/11/16 0010 01/11/16 0716 01/11/16 0838  HGB  --  10.6*  --   --   --   --   HCT  --  32.7*  --   --   --   --   PLT  --  153  --   --   --   --   LABPROT  --   --  27.3*  --   --   --   INR 2.7  --  2.58*  --   --   --   CREATININE  --  3.71*  --   --   --   --   TROPONINI  --   --   --  0.09* 0.10* 0.10*    Estimated Creatinine Clearance: 20.2 mL/min (by C-G formula based on Cr of 3.71).  Assessment: 65 y/o M on warfarin PTA for afib, INR therapeutic last night at 2.58. No INR this morning.  No bleeding reported.  Warfarin PTA dose is 5 mg MWF, 2.5 mg all other days, last dose 3/15 INR goal is 2-2.5 per coumadin clinc  Goal of Therapy: INR goal 2 -2.5 per coumadin clinic  Plan: -Warfarin 5 mg MWF, 2.5 mg all other days -Daily PT/INR -Monitor for bleeding  Eudelia Bunch, Pharm.D. QP:[id number] 01/11/2016 11:32 AM

## 2016-01-11 NOTE — Progress Notes (Signed)
Nutrition Brief Note  Patient identified on the Malnutrition Screening Tool (MST) Report  Wt Readings from Last 15 Encounters:  01/11/16 175 lb 12.8 oz (79.742 kg)  12/18/15 181 lb (82.101 kg)  11/19/15 180 lb (81.647 kg)  11/16/15 175 lb 12 oz (79.72 kg)  11/07/15 177 lb 4.8 oz (80.423 kg)  10/23/15 182 lb (82.555 kg)  10/05/15 179 lb 12.8 oz (81.557 kg)  09/26/15 178 lb (80.74 kg)  08/30/15 182 lb 5.1 oz (82.7 kg)  08/03/15 180 lb (81.647 kg)  07/30/15 180 lb 1.6 oz (81.693 kg)  06/27/15 177 lb (80.287 kg)  06/19/15 182 lb (82.555 kg)  06/08/15 187 lb 12.8 oz (85.186 kg)  05/23/15 193 lb 5.5 oz (87.7 kg)    Body mass index is 28.39 kg/(m^2). Patient meets criteria for overweight based on current BMI. Weight fluctuating due to fluid status.  Current diet order is renal/carb modified with 1200 ml fluids, patient is consuming approximately 100% of meals at this time. Appetite has been fine currently and PTA with no other difficulties. Pt currently has Nepro Shake ordered and would like to continue with them. RD to modify orders to provide Nepro once daily as intake has been adequate. Labs and medications reviewed.   No further nutrition interventions warranted at this time. If nutrition issues arise, please consult RD.   Corrin Parker, MS, RD, LDN Pager # [phone] After hours/ weekend pager # [phone]

## 2016-01-11 NOTE — Progress Notes (Signed)
PT Cancellation Note  Patient Details Name: Tony Freeman MRN: [id number] DOB: 07-Jul-1951   Cancelled Treatment:    Reason Eval/Treat Not Completed: Medical issues which prohibited therapy (elevating troponin without medical notation ).  Will get medical clearance before proceeding with therapy.    Ramond Dial 01/11/2016, 9:18 AM   Mee Hives, PT MS Acute Rehab Dept. Number: [facility] [id number] and [place] [phone]

## 2016-01-11 NOTE — Progress Notes (Addendum)
New Admission Note:   Arrival: Via ED on stretcher with EMT Mental Orientation: A&Ox4 Telemetry: Placed on box 6e23 Assessment:  See doc flowsheet Skin: Small cut on right big toe.  Bandaid placed per patient request.  Small wound with redness on penile tip.  Old fistula on left arm; patient stated that it cannot be used. IV: Right AC IV, infusing Pain: 6 out of 10 pain in penis Safety Measures:  Call bell placed within reach; patient instructed on use of call bell and verbalized understanding. Bed in lowest position.  Yellow bracelet on.  Non-skid socks on.  Bed alarm on. [address] Orientation: Patient oriented to staff, room, and unit. Family: None at bedside  Orders have been reviewed and implemented. Patient requested to place some of his belongings in the safe.  Valuables that have been placed per patient's request are as follows: $11 cash, [employer] card, driver's license, [employer] card, [facility] card, $10 [employer] gift card, [employer] debit card, and [place] [employer] identification card. Cash counted with Hilbert Corrigan, RN with patient present.  Belongings above placed in patient valuables envelope and sealed at bedside.  Envelope given to security, and copy of belongings list and safebox key placed in patient's chart.  Will continue to monitor.  Arlyss Queen, RN, BSN

## 2016-01-12 DIAGNOSIS — I2510 Atherosclerotic heart disease of native coronary artery without angina pectoris: Secondary | ICD-10-CM

## 2016-01-12 LAB — RENAL FUNCTION PANEL
ALBUMIN: 3.1 g/dL — AB (ref 3.5–5.0)
Anion gap: 12 (ref 5–15)
BUN: 42 mg/dL — ABNORMAL HIGH (ref 6–20)
CALCIUM: 9.5 mg/dL (ref 8.9–10.3)
CO2: 27 mmol/L (ref 22–32)
CREATININE: 6.46 mg/dL — AB (ref 0.61–1.24)
Chloride: 96 mmol/L — ABNORMAL LOW (ref 101–111)
GFR, EST AFRICAN AMERICAN: 9 mL/min — AB (ref >60)
GFR, EST NON AFRICAN AMERICAN: 8 mL/min — AB (ref >60)
Glucose, Bld: 121 mg/dL — ABNORMAL HIGH (ref 65–99)
PHOSPHORUS: 5.3 mg/dL — AB (ref 2.5–4.6)
Potassium: 4.2 mmol/L (ref 3.5–5.1)
SODIUM: 135 mmol/L (ref 135–145)

## 2016-01-12 LAB — GLUCOSE, CAPILLARY
GLUCOSE-CAPILLARY: 106 mg/dL — AB (ref 65–99)
GLUCOSE-CAPILLARY: 99 mg/dL (ref 65–99)
GLUCOSE-CAPILLARY: 105 mg/dL — AB (ref 65–99)
Glucose-Capillary: 157 mg/dL — ABNORMAL HIGH (ref 65–99)

## 2016-01-12 LAB — CBC
HCT: 29 % — ABNORMAL LOW (ref 39.0–52.0)
Hemoglobin: 9.1 g/dL — ABNORMAL LOW (ref 13.0–17.0)
MCH: 30.7 pg (ref 26.0–34.0)
MCHC: 31.4 g/dL (ref 30.0–36.0)
MCV: 98 fL (ref 78.0–100.0)
PLATELETS: 119 10*3/uL — AB (ref 150–400)
RBC: 2.96 MIL/uL — AB (ref 4.22–5.81)
RDW: 15.4 % (ref 11.5–15.5)
WBC: 6.5 10*3/uL (ref 4.0–10.5)

## 2016-01-12 LAB — STREP PNEUMONIAE URINARY ANTIGEN: Strep Pneumo Urinary Antigen: NEGATIVE

## 2016-01-12 LAB — PROTIME-INR
INR: 2.2 — ABNORMAL HIGH (ref 0.00–1.49)
Prothrombin Time: 24.3 seconds — ABNORMAL HIGH (ref 11.6–15.2)

## 2016-01-12 MED ORDER — OXYCODONE-ACETAMINOPHEN 5-325 MG PO TABS
ORAL_TABLET | ORAL | Status: AC
  Filled 2016-01-12: qty 1

## 2016-01-12 MED ORDER — VANCOMYCIN HCL IN DEXTROSE 1-5 GM/200ML-% IV SOLN
INTRAVENOUS | Status: AC
  Administered 2016-01-12: 1 g via INTRAVENOUS
  Filled 2016-01-12: qty 200

## 2016-01-12 MED ORDER — ONDANSETRON HCL 4 MG/2ML IJ SOLN
INTRAMUSCULAR | Status: AC
  Administered 2016-01-12: 4 mg via INTRAVENOUS
  Filled 2016-01-12: qty 2

## 2016-01-12 MED ORDER — ONDANSETRON HCL 4 MG/2ML IJ SOLN
4.0000 mg | Freq: Once | INTRAMUSCULAR | Status: AC
  Administered 2016-01-12: 4 mg via INTRAVENOUS

## 2016-01-12 NOTE — Progress Notes (Signed)
[facility] Progress Note  Dialysis Orders: AF TTS 4.25 hr 400/800 EDW 79.5 2 K 2.25 Ca profile 4 heparin 2600 venofer 50 3/16 Mircera 75 q 2 weeks 3/14 Recent labs; hgb 11.2 3/2 22% sat INR 1.85 2/23 P 5.7 Corr Ca 9.7 iPTH 95 - no VDRA; Access: right IJ and nonmaturing left upper AVF -has plans for placement of right upper AVF  Assessment/Plan: 1. probable HCAP- influenza neg; resp virus pending, blood cultures pending; abtx per primary 2. ESRD - TTS - HD today ; for right upper AVF placement in future due to non maturing left upper AVF 3. Hypertension/volume - BP tends to be low- leaving slightly below edw - challenge slightly as BP allows due to right pleural effusion (seen on CXR post HD 3/16); outpt HD unit meds are not current - they need to be updated at discharge; if bp remains low, would stop losartan (had been on lisinopril but changed to losartan several weeks ago) 4. Anemia - Hgb 10.1 - continue Fe; next dose ESA due 3/28 if still here 5. Metabolic bone disease - Continue binders (fosrenol)- no VDRA P 5.4. 6. Nutrition - renal/carb mod, vitamins 7. Afib - on amiodarone/coumadin/low dose BB; recetn cardioversion 01/02/2016 - Dr. Aundra Dubin 8. CAD hx stent toponins - on plavix, asa, BB troponins 0.09 - 0.1- per primary 9. Depression - on remeron 10. DM/HLD - per primary 11. CHF - EF 15-20% 3.8.2017 TEE 12. Penile ulcer - defer to primary, culture pending - ? Calciphylaxis, though not likely because iPTH never > 94 and never on VDRA  P last outpt was 7.4 13. COPD - quit smoking about 4 years ago - meds per primary  [name], PA-C [place] [phone] 01/12/2016,11:32 AM  LOS: 2 days   Renal Attending: He feels better with less cough and penile pain,  Pelvic xrays show diffuse arterial calcification. Tifany Hirsch C  Subjective:   Feels a little better  Objective Filed Vitals:   01/11/16 2038 01/11/16 2233 01/12/16 0509 01/12/16  0754  BP: 96/50  109/65 96/55  Pulse:   61 69  Temp:   98.3 F (36.8 C) 98.8 F (37.1 C)  TempSrc:   Oral Oral  Resp:   16 17  Height:      Weight:      SpO2:  97% 97% 91%   Physical Exam General: up in chair, looks a little better than yesterday Heart: RRR Lungs: grossly clear Abdomen: soft; GU defered Extremities: no LE edema Dialysis Access:  Right IJ and non maturing left AVF  Additional Objective Labs: Basic Metabolic Panel:  Recent Labs Lab 01/10/16 2005 01/11/16 1850  NA 137 135  K 4.1 4.4  CL 93* 94*  CO2 30 23  GLUCOSE 138* 78  BUN 14 29*  CREATININE 3.71* 5.23*  CALCIUM 9.6 9.6  PHOS  --  5.4*   Liver Function Tests:  Recent Labs Lab 01/11/16 1850  ALBUMIN 3.4*   CBC:  Recent Labs Lab 01/10/16 2005 01/11/16 1850  WBC 5.3 4.9  NEUTROABS 3.5  --   HGB 10.6* 10.1*  HCT 32.7* 30.5*  MCV 98.5 96.8  PLT 153 93*   Blood Culture    Component Value Date/Time   SDES ULCER 01/11/2016 0040   SPECREQUEST PENIS 01/11/2016 0040   CULT  01/11/2016 0040    Culture reincubated for better growth Performed at [facility] PENDING 01/11/2016 0040    Cardiac Enzymes:  Recent Labs Lab 01/11/16 0010 01/11/16 0716 01/11/16 0838  TROPONINI 0.09* 0.10* 0.10*   CBG:  Recent Labs Lab 01/11/16 0747 01/11/16 1203 01/11/16 1718 01/11/16 2152 01/12/16 0753  GLUCAP 82 134* 98 143* 106*    Studies/Results: Dg Chest 2 View  01/10/2016  CLINICAL DATA:  Shortness of breath EXAM: CHEST  2 VIEW COMPARISON:  March 30 26 FINDINGS: The heart size and mediastinal contours are stable. Heart size is enlarged. Cardiac pacemaker is unchanged. Dual lumen right central venous line are noted with distal tips in the superior vena cava. There is a moderate right pleural effusion with consolidation of right lung base. There is a small left pleural effusion. The visualized skeletal structures are stable. IMPRESSION: Moderate right pleural effusion  with consolidation right lung base, underlying pneumonia is not excluded. Small left pleural effusion. Electronically Signed   By: Abelardo Diesel M.D.   On: 01/10/2016 19:22   Dg Pelvis 1-2 Views  01/11/2016  CLINICAL DATA:  Soft tissue ulcerations EXAM: PELVIS - 1-2 VIEW COMPARISON:  None. FINDINGS: Bony structures are within normal limits. Diffuse vascular calcifications are seen. No other soft tissue abnormality is noted. IMPRESSION: No acute abnormality seen. Electronically Signed   By: Inez Catalina M.D.   On: 01/11/2016 20:58   Medications:   . acyclovir  200 mg Intravenous Q24H  . amiodarone  100 mg Oral Daily  . atorvastatin  40 mg Oral Daily  . carvedilol  9.375 mg Oral BID WC  . ceFEPime (MAXIPIME) IV  2 g Intravenous Q T,Th,Sat-1800  . clopidogrel  75 mg Oral Daily  . dextromethorphan-guaiFENesin  1 tablet Oral BID  . feeding supplement (NEPRO CARB STEADY)  237 mL Oral BID BM  . [START ON 01/17/2016] ferric gluconate (FERRLECIT/NULECIT) IV  62.5 mg Intravenous Q Thu-HD  . insulin aspart  0-5 Units Subcutaneous QHS  . insulin aspart  0-9 Units Subcutaneous TID WC  . lanthanum  1,000 mg Oral TID WC  . losartan  25 mg Oral Daily  . mometasone-formoterol  2 puff Inhalation BID  . multivitamin with minerals  1 tablet Oral Daily  . pneumococcal 23 valent vaccine  0.5 mL Intramuscular Tomorrow-1000  . vancomycin  1,000 mg Intravenous Q T,Th,Sa-HD  . warfarin  2.5 mg Oral Q T,Th,S,Su-1800  . warfarin  5 mg Oral Q M,W,F-1800  . Warfarin - Pharmacist Dosing Inpatient   Does not apply [phone]

## 2016-01-12 NOTE — Evaluation (Signed)
Physical Therapy Evaluation and Discharge Patient Details Name: Tony Freeman MRN: [id number] DOB: 1951-03-15 Today's Date: 01/12/2016   History of Present Illness  Pt is a 65 y/o M who presented w/ SOB and a penial ulcer.  Pt's PMH includes ESRD, DM, CAD, s/p stent placement, AICD, CHF, dementia, PVD, stroke.  Clinical Impression  Pt admitted with above diagnosis. PTA pt requires assist from son w/ sponge bathing and pt will continue to have 24/7 assist/supervision from family at d/c.  Mr. Losee is Ind w/ transfers and ambulation and was able to tolerate high level balance activities w/o instability.  No skilled PT needs identified.  PT is signing off.    Follow Up Recommendations No PT follow up;Supervision/Assistance - 24 hour    Equipment Recommendations  None recommended by PT    Recommendations for Other Services OT consult     Precautions / Restrictions Precautions Precautions: None Restrictions Weight Bearing Restrictions: No      Mobility  Bed Mobility               General bed mobility comments: Pt sitting in recliner chair upon PT arrival  Transfers Overall transfer level: Independent Equipment used: None             General transfer comment: No cues or physical assist needed.  Ambulation/Gait Ambulation/Gait assistance: Independent Ambulation Distance (Feet): 200 Feet Assistive device: None Gait Pattern/deviations: Step-through pattern   Gait velocity interpretation: at or above normal speed for age/gender General Gait Details: Pt leans slightly left but is steady w/ all high level balance activities listed below. HR up to 128, RN notified.    Stairs            Wheelchair Mobility    Modified Rankin (Stroke Patients Only)       Balance Overall balance assessment: No apparent balance deficits (not formally assessed)                           High level balance activites: Backward walking;Turns;Head  turns;Other (comment);Direction changes (stepping over object) High Level Balance Comments: No instability noted w/ high level balance activities documented above.             Pertinent Vitals/Pain Pain Assessment: 0-10 Pain Score: 5  Pain Location: penis Pain Descriptors / Indicators: Discomfort;Grimacing Pain Intervention(s): Limited activity within patient's tolerance;Monitored during session;Premedicated before session    Home Living Family/patient expects to be discharged to:: Private residence Living Arrangements: Children;Other relatives (son and his family) Available Help at Discharge: Family;Available 24 hours/day Type of Home: Apartment Home Access: Level entry     Home Layout: One level Home Equipment: Walker - 2 wheels      Prior Function Level of Independence: Independent;Needs assistance   Gait / Transfers Assistance Needed: does not use AD.  1 near fall over the past 6 months   ADL's / Homemaking Assistance Needed: Needs assist for sponge bath        Hand Dominance   Dominant Hand: Right    Extremity/Trunk Assessment   Upper Extremity Assessment: Overall WFL for tasks assessed;Defer to OT evaluation           Lower Extremity Assessment: Overall WFL for tasks assessed      Cervical / Trunk Assessment: Normal  Communication   Communication: No difficulties  Cognition Arousal/Alertness: Awake/alert Behavior During Therapy: WFL for tasks assessed/performed Overall Cognitive Status: History of cognitive impairments - at baseline  General Comments      Exercises        Assessment/Plan    PT Assessment Patent does not need any further PT services  PT Diagnosis Acute pain   PT Problem List    PT Treatment Interventions     PT Goals (Current goals can be found in the Care Plan section) Acute Rehab PT Goals Patient Stated Goal: decreased pain PT Goal Formulation: All assessment and education complete, DC  therapy    Frequency     Barriers to discharge        Co-evaluation               End of Session Equipment Utilized During Treatment: Gait belt Activity Tolerance: Patient tolerated treatment well Patient left: in chair;with call bell/phone within reach;with family/visitor present Nurse Communication: Mobility status;Other (comment) (HR)         Time: [id number] PT Time Calculation (min) (ACUTE ONLY): 15 min   Charges:   PT Evaluation $PT Eval Low Complexity: 1 Procedure     PT G Codes:       Collie Siad PT, DPT  Pager: [phone] Phone: [phone] 01/12/2016, 11:40 AM

## 2016-01-12 NOTE — Progress Notes (Addendum)
[facility] PROGRESS NOTE  Tony Freeman [id number] DOB: [birth year] DOA: 01/10/2016  PCP: Tanya Nones, MD  Brief HPI: 65 year old African-American male with a past medical history of end-stage renal disease on hemodialysis, hypertension, hyperlipidemia, diabetes, coronary artery disease, chronic systolic and diastolic CHF, AICD placement, paroxysmal atrial fibrillation on Coumadin, presented with shortness of breath and ulcer on his penis. He was hospitalized for management of possible healthcare associated pneumonia and penile ulcer.  Past medical history:  Past Medical History  Diagnosis Date  . Systolic and diastolic CHF, chronic ([place])     a. Echo at [facility] 11/17/2013 Ef <25%, severe hypokinesis of LV, moderately dilated LA, grade IV/IV diastolic dysfunction with irreversible restrictive pattern of mitral inflow, severe pulm HTN (RVSP >16mmHg), 1-2+ MR  b. Echo at [facility] 04/02/14 EF 25-30%, moderate concentric LVH, diffuse hypokinesis, moderately dilated LA, no significant MR observed  . CAD (coronary artery disease)     a. PCI LAD/RCA in 2007  b. PCI L[facility]RCA in [facility]  c. PCI LAD 08/2013, CTO of RCA and nonobstructive disease in the LCx.  Marland Kitchen Hyperlipidemia   . Dementia   . Ischemic cardiomyopathy     a. s/p Biotronik ICD 06/2014.  Marland Kitchen Retinopathy     bilateral  . HTN (hypertension)   . Abnormal renal ultrasound     a. 03/2014: Abnormal renal ultrasound with left renal lesion and bladder wall thickening and microscopic hematuria. Multiple cysts on prior MRI. Given inability to use contrast, f/u imaging limited, repeat US 3 months with outpatient uro f/u.  Marland Kitchen Paroxysmal atrial fibrillation (HCC)   . Paroxysmal atrial flutter ([place])   . Anemia   . Myocardial infarction [facility]) July 2016    Heart attack  . Peripheral vascular disease ([place])   . Stroke [facility])     right arm weakness  . Asthma   . CKD (chronic kidney disease), stage IV ([place])     on dialysis  T/TH/Sa  . 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 H/O cardiac arrest 04/2015  . Constipation   . AICD (automatic cardioverter/defibrillator) present   . Pneumonia     when he was younger  . ESRD (end stage renal disease) on dialysis [facility])     Consultants: Nephrology.  Procedures: Dialysis  Antibiotics: Vancomycin and cefepime and acyclovir  Subjective: Patient states that his cough is improving. The ulcer on his penis continues to be painful. Denies any chest pain. Shortness of breath has improved.  Objective: Vital Signs  Filed Vitals:   01/11/16 2038 01/11/16 2233 01/12/16 0509 01/12/16 0754  BP: 96/50  109/65 96/55  Pulse:   61 69  Temp:   98.3 F (36.8 C) 98.8 F (37.1 C)  TempSrc:   Oral Oral  Resp:   16 17  Height:      Weight:      SpO2:  97% 97% 91%    Intake/Output Summary (Last 24 hours) at 01/12/16 1201 Last data filed at 01/12/16 1014  Gross per 24 hour  Intake    824 ml  Output      0 ml  Net    824 ml   Filed Weights   01/11/16 0026  Weight: 79.742 kg (175 lb 12.8 oz)    General appearance: alert, cooperative, appears stated age and no distress Resp: Crackles noted bilateral bases. No wheezing or rhonchi. Cardio: regular rate and rhythm, S1, S2 normal, no murmur, click, rub  or gallop GI: soft, non-tender; bowel sounds normal; no masses,  no organomegaly 1 cm x 1 cm shallow ulcer noted on the glans penis. No drainage. Tender. Extremities: extremities normal, atraumatic, no cyanosis or edema Neurologic: Awake and alert. Oriented 3. No focal neurological deficits.  Lab Results:  Basic Metabolic Panel:  Recent Labs Lab 01/10/16 2005 01/11/16 1850  NA 137 135  K 4.1 4.4  CL 93* 94*  CO2 30 23  GLUCOSE 138* 78  BUN 14 29*  CREATININE 3.71* 5.23*  CALCIUM 9.6 9.6  PHOS  --  5.4*   Liver Function Tests:  Recent Labs Lab 01/11/16 1850  ALBUMIN 3.4*   CBC:  Recent Labs Lab 01/10/16 2005 01/11/16 1850  WBC 5.3  4.9  NEUTROABS 3.5  --   HGB 10.6* 10.1*  HCT 32.7* 30.5*  MCV 98.5 96.8  PLT 153 93*   Cardiac Enzymes:  Recent Labs Lab 01/11/16 0010 01/11/16 0716 01/11/16 0838  TROPONINI 0.09* 0.10* 0.10*   BNP (last 3 results)  Recent Labs  01/10/16 2006  BNP 4360.3*    CBG:  Recent Labs Lab 01/11/16 0747 01/11/16 1203 01/11/16 1718 01/11/16 2152 01/12/16 0753  GLUCAP 82 134* 98 143* 106*    Recent Results (from the past 240 hour(s))  Culture, blood (routine x 2) Call MD if unable to obtain prior to antibiotics being given     Status: None (Preliminary result)   Collection Time: 01/11/16 12:05 AM  Result Value Ref Range Status   Specimen Description BLOOD RIGHT FOREARM  Final   Special Requests BOTTLES DRAWN AEROBIC ONLY 7ML  Final   Culture PENDING  Incomplete   Report Status PENDING  Incomplete  Wound culture     Status: None (Preliminary result)   Collection Time: 01/11/16 12:40 AM  Result Value Ref Range Status   Specimen Description ULCER  Final   Special Requests PENIS  Final   Gram Stain PENDING  Incomplete   Culture   Final    Culture reincubated for better growth Performed at [facility]    Report Status PENDING  Incomplete  MRSA PCR Screening     Status: None   Collection Time: 01/11/16 12:40 AM  Result Value Ref Range Status   MRSA by PCR NEGATIVE NEGATIVE Final    Comment:        The GeneXpert MRSA Assay (FDA approved for NASAL specimens only), is one component of a comprehensive MRSA colonization surveillance program. It is not intended to diagnose MRSA infection nor to guide or monitor treatment for MRSA infections.       Studies/Results: Dg Chest 2 View  01/10/2016  CLINICAL DATA:  Shortness of breath EXAM: CHEST  2 VIEW COMPARISON:  March 30 26 FINDINGS: The heart size and mediastinal contours are stable. Heart size is enlarged. Cardiac pacemaker is unchanged. Dual lumen right central venous line are noted with distal tips in  the superior vena cava. There is a moderate right pleural effusion with consolidation of right lung base. There is a small left pleural effusion. The visualized skeletal structures are stable. IMPRESSION: Moderate right pleural effusion with consolidation right lung base, underlying pneumonia is not excluded. Small left pleural effusion. Electronically Signed   By: Abelardo Diesel M.D.   On: 01/10/2016 19:22   Dg Pelvis 1-2 Views  01/11/2016  CLINICAL DATA:  Soft tissue ulcerations EXAM: PELVIS - 1-2 VIEW COMPARISON:  None. FINDINGS: Bony structures are within normal limits. Diffuse vascular calcifications are seen.  No other soft tissue abnormality is noted. IMPRESSION: No acute abnormality seen. Electronically Signed   By: Inez Catalina M.D.   On: 01/11/2016 20:58    Medications:  Scheduled: . acyclovir  200 mg Intravenous Q24H  . amiodarone  100 mg Oral Daily  . atorvastatin  40 mg Oral Daily  . carvedilol  9.375 mg Oral BID WC  . ceFEPime (MAXIPIME) IV  2 g Intravenous Q T,Th,Sat-1800  . clopidogrel  75 mg Oral Daily  . dextromethorphan-guaiFENesin  1 tablet Oral BID  . feeding supplement (NEPRO CARB STEADY)  237 mL Oral BID BM  . [START ON 01/17/2016] ferric gluconate (FERRLECIT/NULECIT) IV  62.5 mg Intravenous Q Thu-HD  . insulin aspart  0-5 Units Subcutaneous QHS  . insulin aspart  0-9 Units Subcutaneous TID WC  . lanthanum  1,000 mg Oral TID WC  . losartan  25 mg Oral Daily  . mometasone-formoterol  2 puff Inhalation BID  . multivitamin with minerals  1 tablet Oral Daily  . pneumococcal 23 valent vaccine  0.5 mL Intramuscular Tomorrow-1000  . vancomycin  1,000 mg Intravenous Q T,Th,Sa-HD  . warfarin  2.5 mg Oral Q T,Th,S,Su-1800  . warfarin  5 mg Oral Q M,W,F-1800  . Warfarin - Pharmacist Dosing Inpatient   Does not apply q1800   Continuous:  [id number] chloride, sodium chloride, albuterol, alteplase, docusate sodium, heparin, heparin, lidocaine (PF), lidocaine-prilocaine, morphine  injection, nitroGLYCERIN, oxyCODONE-acetaminophen, pentafluoroprop-tetrafluoroeth  Assessment/Plan:  Principal Problem:   HCAP (healthcare-associated pneumonia) Active Problems:   Elevated troponin   DM (diabetes mellitus), type 2, uncontrolled, with renal complications (HCC)   Essential hypertension, benign   Chronic combined systolic and diastolic CHF (congestive heart failure) (HCC)   CAD (coronary artery disease)   PAF (paroxysmal atrial fibrillation) (HCC)   Atrial fibrillation (HCC)   HLD (hyperlipidemia)   ESRD (end stage renal disease) on dialysis ([place])   Ulcer of penis    Healthcare associated pneumonia with right-sided pleural effusion Patient was started on vancomycin and cefepime. Blood cultures are pending so far. Urine for strep pneumonia antigen is negative. If negative by tomorrow, he could be changed over to oral antibiotics. Influenza PCR is negative. Symptomatically improving. Will need follow-up chest x-ray in a few weeks' time to ensure resolution of the effusion.  Penile ulcer Etiology is unclear. Infectious etiology not ruled out. HIV is nonreactive. RPR is nonreactive. Continue antiviral treatment. Calciphylaxis is a possibility. GC/chlamydia probe is pending  History of coronary artery disease with mildly elevated troponin Stable. Patient denies any chest pain. Mildly elevated troponin, likely due to renal failure. No need for further workup at this time.  DM-II Continue SSI. Monitor CBGs.  Essential Hypertension Continue Coreg, Cozaar  Chronic combined systolic and diastolic CHF (congestive heart failure)  2-D echo on 11/29/15 showed EF 15-20 percent. Patient has AICD placement. No leg edema. Volume is managed by renal via dialysis. CHF is compensated. Continue antiplatelet agent.  Atrial Fibrillation CHA2DS2-VASc Score is 7, needs oral anticoagulation. Patient is on Coumadin at home. INR is therapeutic. Continue Coreg and amiodarone.  Continue  Coumadin per pharmacy  HLD Continue home medications: Lipitor  ESRD-HD (TTS) Nephrology is following. To be dialyzed today.   DVT Prophylaxis: On warfarin    Code Status: Full code  Family Communication: Discussed with patient  Disposition Plan: Continue management as outlined above. Anticipate change to oral antibiotics tomorrow.    LOS: 2 days   [facility] Pager [phone] 01/12/2016, 12:01 PM  If 7PM-7AM, please contact night-coverage at [email or web address], password [facility]

## 2016-01-12 NOTE — Progress Notes (Signed)
[facility] for Warfarin  Indication: atrial fibrillation  No Known Allergies   Vital Signs: Temp: 98.8 F (37.1 C) (03/18 0754) Temp Source: Oral (03/18 0754) BP: 96/55 mmHg (03/18 0754) Pulse Rate: 69 (03/18 0754)  Labs:  Recent Labs  01/10/16 2005 01/10/16 2110 01/11/16 0010 01/11/16 0716 01/11/16 0838 01/11/16 1850 01/12/16 0604  HGB 10.6*  --   --   --   --  10.1*  --   HCT 32.7*  --   --   --   --  30.5*  --   PLT 153  --   --   --   --  93*  --   LABPROT  --  27.3*  --   --   --   --  24.3*  INR  --  2.58*  --   --   --   --  2.20*  CREATININE 3.71*  --   --   --   --  5.23*  --   TROPONINI  --   --  0.09* 0.10* 0.10*  --   --     Estimated Creatinine Clearance: 14.4 mL/min (by C-G formula based on Cr of 5.23).  Assessment: 65 y/o M on warfarin PTA for afib Warfarin PTA dose is 5 mg MWF, 2.5 mg all other days, last dose 3/15  INR goal is 2-2.5 per coumadin clinc  INR therapeutic 2.20, Hgb 10.1, Plts 93, no s/sx bleeding noted  Goal of Therapy: INR goal 2 -2.5 per coumadin clinic  Plan: -Warfarin 5 mg MWF, 2.5 mg all other days -Daily PT/INR -Monitor for bleeding  Bennye Alm, PharmD Pharmacy Resident [phone]

## 2016-01-13 DIAGNOSIS — J1000 Influenza due to other identified influenza virus with unspecified type of pneumonia: Secondary | ICD-10-CM | POA: Diagnosis not present

## 2016-01-13 LAB — GLUCOSE, CAPILLARY
GLUCOSE-CAPILLARY: 119 mg/dL — AB (ref 65–99)
Glucose-Capillary: 89 mg/dL (ref 65–99)

## 2016-01-13 LAB — PROTIME-INR
INR: 2.37 — AB (ref 0.00–1.49)
Prothrombin Time: 25.7 seconds — ABNORMAL HIGH (ref 11.6–15.2)

## 2016-01-13 MED ORDER — CEFUROXIME AXETIL 500 MG PO TABS
500.0000 mg | ORAL_TABLET | ORAL | Status: DC
  Administered 2016-01-13 – 2016-01-19 (×7): 500 mg via ORAL
  Filled 2016-01-13 (×7): qty 1

## 2016-01-13 MED ORDER — GABAPENTIN 600 MG PO TABS: 300.0000 mg | ORAL_TABLET | Freq: Every day | ORAL | Status: DC

## 2016-01-13 MED ORDER — GABAPENTIN 300 MG PO CAPS
300.0000 mg | ORAL_CAPSULE | Freq: Every day | ORAL | Status: DC
  Administered 2016-01-13 – 2016-01-14 (×2): 300 mg via ORAL
  Filled 2016-01-13 (×2): qty 1

## 2016-01-13 MED ORDER — CEFUROXIME AXETIL 500 MG PO TABS: 250.0000 mg | ORAL_TABLET | Freq: Two times a day (BID) | ORAL | Status: DC

## 2016-01-13 MED ORDER — DIPHENHYDRAMINE-ZINC ACETATE 2-0.1 % EX CREA
TOPICAL_CREAM | Freq: Two times a day (BID) | CUTANEOUS | Status: DC | PRN
  Administered 2016-01-15 – 2016-01-18 (×4): via TOPICAL
  Filled 2016-01-13 (×3): qty 28

## 2016-01-13 NOTE — Progress Notes (Signed)
Assessment/Plan: 1. ?HCAP- RLL effusion/?inf 2. ESRD - TTS ; for right upper AVF placement in future due to non maturing left upper AVF 3. Hypertension/volume - BP tends to be low- leaving slightly below edw - challenge  4. Afib - on amiodarone/coumadin/low dose BB; cardioversion 01/02/2016 - Dr. Aundra Dubin 5. CAD hx stent toponins - on plavix, asa, BB 6. Depression - on remeron 7. CHF - EF 15-20% 3.8.2017 TEE 8. Penile ulcer - defer to primary- ? Calciphylaxis  Subjective: Interval History: "stinging" of penis  Objective: Vital signs in last 24 hours: Temp:  [97.3 F (36.3 C)-98.5 F (36.9 C)] 98.1 F (36.7 C) (03/19 0449) Pulse Rate:  [60-82] 82 (03/19 0449) Resp:  [16-23] 23 (03/19 0449) BP: (96-119)/(64-74) 119/68 mmHg (03/19 0449) SpO2:  [90 %-98 %] 92 % (03/19 0923) Weight:  [79 kg (174 lb 2.6 oz)-81.2 kg (179 lb 0.2 oz)] 79 kg (174 lb 2.6 oz) (03/18 2150) Weight change:   Intake/Output from previous day: 03/18 0701 - 03/19 0700 In: 1310 [P.O.:960; IV Piggyback:350] Out: 2000  Intake/Output this shift:    General appearance: alert and cooperative Resp: clear to auscultation bilaterally Cardio: regular rate and rhythm, S1, S2 normal, no murmur, click, rub or gallop  Lab Results:  Recent Labs  01/11/16 1850 01/12/16 1917  WBC 4.9 6.5  HGB 10.1* 9.1*  HCT 30.5* 29.0*  PLT 93* 119*   BMET:  Recent Labs  01/11/16 1850 01/12/16 1917  NA 135 135  K 4.4 4.2  CL 94* 96*  CO2 23 27  GLUCOSE 78 121*  BUN 29* 42*  CREATININE 5.23* 6.46*  CALCIUM 9.6 9.5   No results for input(s): PTH in the last 72 hours. Iron Studies: No results for input(s): IRON, TIBC, TRANSFERRIN, FERRITIN in the last 72 hours. Studies/Results: Dg Pelvis 1-2 Views  01/11/2016  CLINICAL DATA:  Soft tissue ulcerations EXAM: PELVIS - 1-2 VIEW COMPARISON:  None. FINDINGS: Bony structures are within normal limits. Diffuse vascular calcifications are seen. No other soft tissue abnormality is  noted. IMPRESSION: No acute abnormality seen. Electronically Signed   By: Inez Catalina M.D.   On: 01/11/2016 20:58    Scheduled: . acyclovir  200 mg Intravenous Q24H  . amiodarone  100 mg Oral Daily  . atorvastatin  40 mg Oral Daily  . carvedilol  9.375 mg Oral BID WC  . ceFEPime (MAXIPIME) IV  2 g Intravenous Q T,Th,Sat-1800  . clopidogrel  75 mg Oral Daily  . dextromethorphan-guaiFENesin  1 tablet Oral BID  . feeding supplement (NEPRO CARB STEADY)  237 mL Oral BID BM  . [START ON 01/17/2016] ferric gluconate (FERRLECIT/NULECIT) IV  62.5 mg Intravenous Q Thu-HD  . insulin aspart  0-5 Units Subcutaneous QHS  . insulin aspart  0-9 Units Subcutaneous TID WC  . lanthanum  1,000 mg Oral TID WC  . losartan  25 mg Oral Daily  . mometasone-formoterol  2 puff Inhalation BID  . multivitamin with minerals  1 tablet Oral Daily  . pneumococcal 23 valent vaccine  0.5 mL Intramuscular Tomorrow-1000  . vancomycin  1,000 mg Intravenous Q T,Th,Sa-HD  . warfarin  2.5 mg Oral Q T,Th,S,Su-1800  . warfarin  5 mg Oral Q M,W,F-1800  . Warfarin - Pharmacist Dosing Inpatient   Does not apply q1800     LOS: 3 days   Tony Freeman C 01/13/2016,12:08 PM

## 2016-01-13 NOTE — Progress Notes (Signed)
MDI not given pt is sleeping comfortably at this time

## 2016-01-13 NOTE — Progress Notes (Signed)
[facility] PROGRESS NOTE  Tony Freeman [id number] DOB: 30-Mar-1951 DOA: 01/10/2016 PCP: Tanya Nones, MD  HPI/Brief narrative 65 year old African-American male with a past medical history of end-stage renal disease on hemodialysis, hypertension, hyperlipidemia, diabetes, coronary artery disease, chronic systolic and diastolic CHF, AICD placement, paroxysmal atrial fibrillation on Coumadin, presented with shortness of breath and ulcer on his penis. He was hospitalized for management of possible healthcare associated pneumonia and penile ulcer.  Assessment/Plan: Healthcare associated pneumonia with right-sided pleural effusion Patient was continued on vancomycin and cefepime. Blood cultures are neg so far. Urine for strep pneumonia antigen is negative. Influenza PCR is negative. Symptomatically improving. Will need follow-up chest x-ray in a few weeks' time to ensure resolution of the effusion. Will transition to PO abx today  Penile ulcer Etiology is unclear. Infectious etiology not ruled out. HIV is nonreactive. RPR is nonreactive. Continue antiviral treatment. Calciphylaxis is a possibility. GC/chlamydia probe is pending  History of coronary artery disease with mildly elevated troponin Stable. Patient denies any chest pain. Mildly elevated troponin, likely due to renal failure. No need for further workup at this time.  DM-II Continue SSI. Monitor CBGs.  Essential Hypertension Continue Coreg, Cozaar  Chronic combined systolic and diastolic CHF (congestive heart failure)  2-D echo on 11/29/15 showed EF 15-20 percent. Patient has AICD placement. No leg edema. Volume is managed by renal via dialysis. CHF is compensated. Continue antiplatelet agent.  Atrial Fibrillation CHA2DS2-VASc Score is 7, needs oral anticoagulation. Patient is on Coumadin at home. INR is therapeutic. Continue Coreg and amiodarone.  Continue Coumadin per pharmacy  HLD Continue home  medications: Lipitor  ESRD-HD (TTS) Nephrology is following. To be dialyzed today.  Code Status: Full Family Communication: Pt in room Disposition Plan: Possible d/c in 24hrs   Consultants:  Nephrology  Procedures:    Antibiotics: Anti-infectives    Start     Dose/Rate Route Frequency Ordered Stop   01/13/16 1700  cefUROXime (CEFTIN) tablet 250 mg     250 mg Oral 2 times daily with meals 01/13/16 1521     01/12/16 1800  ceFEPIme (MAXIPIME) 2 g in dextrose 5 % 50 mL IVPB  Status:  Discontinued     2 g 100 mL/hr over 30 Minutes Intravenous Every T-Th-Sa (1800) 01/10/16 2325 01/13/16 1521   01/12/16 1200  vancomycin (VANCOCIN) IVPB 1000 mg/200 mL premix  Status:  Discontinued     1,000 mg 200 mL/hr over 60 Minutes Intravenous Every T-Th-Sa (Hemodialysis) 01/10/16 2325 01/13/16 1521   01/11/16 2200  acyclovir (ZOVIRAX) 200 mg in dextrose 5 % 100 mL IVPB     200 mg 104 mL/hr over 60 Minutes Intravenous Every 24 hours 01/10/16 2330     01/10/16 2345  acyclovir (ZOVIRAX) 200 mg in dextrose 5 % 100 mL IVPB     200 mg 104 mL/hr over 60 Minutes Intravenous  Once 01/10/16 2330 01/11/16 0202   01/10/16 2145  ceFEPIme (MAXIPIME) 2 g in dextrose 5 % 50 mL IVPB     2 g 100 mL/hr over 30 Minutes Intravenous  Once 01/10/16 2132 01/11/16 0256   01/10/16 2130  vancomycin (VANCOCIN) 1,750 mg in sodium chloride 0.9 % 500 mL IVPB     1,750 mg 250 mL/hr over 120 Minutes Intravenous  Once 01/10/16 2128 01/11/16 0004   01/10/16 2100  vancomycin (VANCOCIN) IVPB 1000 mg/200 mL premix  Status:  Discontinued     1,000 mg 200 mL/hr over 60 Minutes Intravenous  Once 01/10/16 2043 01/10/16  2128   01/10/16 2045  ceFEPIme (MAXIPIME) 1 g in dextrose 5 % 50 mL IVPB  Status:  Discontinued     1 g 100 mL/hr over 30 Minutes Intravenous  Once 01/10/16 2034 01/10/16 2132      HPI/Subjective: Complaining of burning/itching in feet  Objective: Filed Vitals:   01/12/16 2150 01/12/16 2237 01/13/16 0449  01/13/16 0923  BP: 106/70 102/67 119/68   Pulse: 77 80 82   Temp: 98 F (36.7 C) 98.1 F (36.7 C) 98.1 F (36.7 C)   TempSrc: Oral     Resp: 18 20 23    Height:      Weight: 79 kg (174 lb 2.6 oz)     SpO2:  90% 91% 92%    Intake/Output Summary (Last 24 hours) at 01/13/16 1521 Last data filed at 01/12/16 2326  Gross per 24 hour  Intake    830 ml  Output   2000 ml  Net  -1170 ml   Filed Weights   01/12/16 1845 01/12/16 2100 01/12/16 2150  Weight: 81.2 kg (179 lb 0.2 oz) 79 kg (174 lb 2.6 oz) 79 kg (174 lb 2.6 oz)    Exam:   General:  Awake, in nad  Cardiovascular: regular, s1, s2  Respiratory: normal resp effort, no wheezing  Abdomen: soft, nondistended  Musculoskeletal: perfused, no clubbing   Data Reviewed: Basic Metabolic Panel:  Recent Labs Lab 01/10/16 2005 01/11/16 1850 01/12/16 1917  NA 137 135 135  K 4.1 4.4 4.2  CL 93* 94* 96*  CO2 30 23 27   GLUCOSE 138* 78 121*  BUN 14 29* 42*  CREATININE 3.71* 5.23* 6.46*  CALCIUM 9.6 9.6 9.5  PHOS  --  5.4* 5.3*   Liver Function Tests:  Recent Labs Lab 01/11/16 1850 01/12/16 1917  ALBUMIN 3.4* 3.1*   No results for input(s): LIPASE, AMYLASE in the last 168 hours. No results for input(s): AMMONIA in the last 168 hours. CBC:  Recent Labs Lab 01/10/16 2005 01/11/16 1850 01/12/16 1917  WBC 5.3 4.9 6.5  NEUTROABS 3.5  --   --   HGB 10.6* 10.1* 9.1*  HCT 32.7* 30.5* 29.0*  MCV 98.5 96.8 98.0  PLT 153 93* 119*   Cardiac Enzymes:  Recent Labs Lab 01/11/16 0010 01/11/16 0716 01/11/16 0838  TROPONINI 0.09* 0.10* 0.10*   BNP (last 3 results)  Recent Labs  01/10/16 2006  BNP 4360.3*    ProBNP (last 3 results) No results for input(s): PROBNP in the last 8760 hours.  CBG:  Recent Labs Lab 01/12/16 0753 01/12/16 1148 01/12/16 1651 01/12/16 2232 01/13/16 0810  GLUCAP 106* 157* 99 105* 89    Recent Results (from the past 240 hour(s))  Culture, blood (routine x 2) Call MD if  unable to obtain prior to antibiotics being given     Status: None (Preliminary result)   Collection Time: 01/11/16 12:05 AM  Result Value Ref Range Status   Specimen Description BLOOD RIGHT FOREARM  Final   Special Requests BOTTLES DRAWN AEROBIC ONLY 7ML  Final   Culture NO GROWTH 2 DAYS  Final   Report Status PENDING  Incomplete  Culture, blood (routine x 2) Call MD if unable to obtain prior to antibiotics being given     Status: None (Preliminary result)   Collection Time: 01/11/16 12:10 AM  Result Value Ref Range Status   Specimen Description BLOOD RIGHT HAND  Final   Special Requests BOTTLES DRAWN AEROBIC AND ANAEROBIC 5ML  Final  Culture NO GROWTH 2 DAYS  Final   Report Status PENDING  Incomplete  Wound culture     Status: None (Preliminary result)   Collection Time: 01/11/16 12:40 AM  Result Value Ref Range Status   Specimen Description ULCER  Final   Special Requests PENIS  Final   Gram Stain PENDING  Incomplete   Culture   Final    NORMAL SKIN FLORA Performed at [facility]    Report Status PENDING  Incomplete  MRSA PCR Screening     Status: None   Collection Time: 01/11/16 12:40 AM  Result Value Ref Range Status   MRSA by PCR NEGATIVE NEGATIVE Final    Comment:        The GeneXpert MRSA Assay (FDA approved for NASAL specimens only), is one component of a comprehensive MRSA colonization surveillance program. It is not intended to diagnose MRSA infection nor to guide or monitor treatment for MRSA infections.      Studies: Dg Pelvis 1-2 Views  01/11/2016  CLINICAL DATA:  Soft tissue ulcerations EXAM: PELVIS - 1-2 VIEW COMPARISON:  None. FINDINGS: Bony structures are within normal limits. Diffuse vascular calcifications are seen. No other soft tissue abnormality is noted. IMPRESSION: No acute abnormality seen. Electronically Signed   By: Inez Catalina M.D.   On: 01/11/2016 20:58    Scheduled Meds: . acyclovir  200 mg Intravenous Q24H  . amiodarone  100  mg Oral Daily  . atorvastatin  40 mg Oral Daily  . carvedilol  9.375 mg Oral BID WC  . cefUROXime  250 mg Oral BID WC  . clopidogrel  75 mg Oral Daily  . dextromethorphan-guaiFENesin  1 tablet Oral BID  . feeding supplement (NEPRO CARB STEADY)  237 mL Oral BID BM  . [START ON 01/17/2016] ferric gluconate (FERRLECIT/NULECIT) IV  62.5 mg Intravenous Q Thu-HD  . gabapentin  300 mg Oral Daily  . insulin aspart  0-5 Units Subcutaneous QHS  . insulin aspart  0-9 Units Subcutaneous TID WC  . lanthanum  1,000 mg Oral TID WC  . losartan  25 mg Oral Daily  . mometasone-formoterol  2 puff Inhalation BID  . multivitamin with minerals  1 tablet Oral Daily  . pneumococcal 23 valent vaccine  0.5 mL Intramuscular Tomorrow-1000  . warfarin  2.5 mg Oral Q T,Th,S,Su-1800  . warfarin  5 mg Oral Q M,W,F-1800  . Warfarin - Pharmacist Dosing Inpatient   Does not apply q1800   Continuous Infusions:   Principal Problem:   HCAP (healthcare-associated pneumonia) Active Problems:   Elevated troponin   DM (diabetes mellitus), type 2, uncontrolled, with renal complications (HCC)   Essential hypertension, benign   Chronic combined systolic and diastolic CHF (congestive heart failure) (HCC)   CAD (coronary artery disease)   PAF (paroxysmal atrial fibrillation) (HCC)   Atrial fibrillation (HCC)   HLD (hyperlipidemia)   ESRD (end stage renal disease) on dialysis ([place])   Ulcer of penis    [facility], [facility] Pager [phone]. If 7PM-7AM, please contact night-coverage at [email or web address], password [email or web address] 01/13/2016, 3:21 PM  LOS: 3 days

## 2016-01-13 NOTE — Progress Notes (Addendum)
Pharmacy Antibiotic Note  Tony Freeman is a 65 y.o. male admitted on 01/10/2016 with pneumonia. Pt w/ ESRD receiving HD on TuThSat. Pharmacy has been consulted for Vancomycin/Cefepime for HCAP and Acyclovir for penile herpes infection.   Plan: Vancomycin 1000 mg IV qHD TTS  Cefepime 2g IV q1800 on HD days Acyclovir 200 mg IV q24h F/U Length of therapy/MD plan to narrow abx tx F/U cultures, HD plan  Vanc Pre-HD level Tuesday 3/21 if MD decides to continue.    Height: 5\' 6"  (167.6 cm) Weight: 174 lb 2.6 oz (79 kg) IBW/kg (Calculated) : 63.8  Temp (24hrs), Avg:98 F (36.7 C), Min:97.3 F (36.3 C), Max:98.5 F (36.9 C)   Recent Labs Lab 01/10/16 2005 01/11/16 1850 01/12/16 1917  WBC 5.3 4.9 6.5  CREATININE 3.71* 5.23* 6.46*    Estimated Creatinine Clearance: 11.6 mL/min (by C-G formula based on Cr of 6.46).    No Known Allergies  Antimicrobials this admission: Acyclovir 3/17>> Cefepime 3/17>> Vanc 3/16>>  Cultures: 3/17 BCx2>> NGTD 3/17 wound cx>> pending 3/17 MRSA PCR - Negative 3/17 Flu -neg  Thank you for allowing pharmacy to be a part of this patient's care.  Bennye Alm, PharmD Pharmacy Resident [phone]

## 2016-01-13 NOTE — Progress Notes (Signed)
[facility] for Warfarin  Indication: atrial fibrillation  No Known Allergies   Vital Signs: Temp: 98.1 F (36.7 C) (03/19 0449) BP: 119/68 mmHg (03/19 0449) Pulse Rate: 82 (03/19 0449)  Labs:  Recent Labs  01/10/16 2005 01/10/16 2110 01/11/16 0010 01/11/16 0716 01/11/16 0838 01/11/16 1850 01/12/16 0604 01/12/16 1917 01/13/16 0557  HGB 10.6*  --   --   --   --  10.1*  --  9.1*  --   HCT 32.7*  --   --   --   --  30.5*  --  29.0*  --   PLT 153  --   --   --   --  93*  --  119*  --   LABPROT  --  27.3*  --   --   --   --  24.3*  --  25.7*  INR  --  2.58*  --   --   --   --  2.20*  --  2.37*  CREATININE 3.71*  --   --   --   --  5.23*  --  6.46*  --   TROPONINI  --   --  0.09* 0.10* 0.10*  --   --   --   --     Estimated Creatinine Clearance: 11.6 mL/min (by C-G formula based on Cr of 6.46).  Assessment: 65 y/o M on warfarin PTA for afib Warfarin PTA dose is 5 mg MWF, 2.5 mg all other days, last dose 3/15  INR goal is 2-2.5 per coumadin clinc  INR therapeutic 2.37, Hgb 9.1, Plts 119, no s/sx bleeding noted  Goal of Therapy: INR goal 2 -2.5 per coumadin clinic  Plan: -Warfarin 5 mg MWF, 2.5 mg all other days -Daily PT/INR -Monitor for bleeding  Bennye Alm, PharmD Pharmacy Resident [phone]

## 2016-01-13 NOTE — Evaluation (Signed)
Occupational Therapy Evaluation Patient Details Name: Tony Freeman MRN: [id number] DOB: July 06, 1951 Today's Date: 01/13/2016    History of Present Illness Pt is a 65 y/o M who presented w/ SOB and a penial ulcer.  Pt's PMH includes ESRD, DM, CAD, s/p stent placement, AICD, CHF, dementia, PVD, stroke.HD  T, TH, SAT   Clinical Impression   Pt. Is at baseline for ADLs. Pt. Is able to perform ADLs at I level except for shower. Pt. Family provides S for shower task to ensure the HD port does not get wet. Pt. Does not need any skilled OT services.    Follow Up Recommendations  No OT follow up    Equipment Recommendations       Recommendations for Other Services       Precautions / Restrictions Precautions Precautions: None Restrictions Weight Bearing Restrictions: No      Mobility Bed Mobility               General bed mobility comments: Pt sitting in recliner chair upon PT arrival  Transfers Overall transfer level: Independent Equipment used: None             General transfer comment: No cues or physical assist needed.    Balance                                            ADL Overall ADL's : At baseline                                       General ADL Comments: Pt. is able to perform ADLs.      Vision     Perception     Praxis      Pertinent Vitals/Pain Pain Assessment: 0-10 Pain Score: 0-No pain     Hand Dominance Right   Extremity/Trunk Assessment Upper Extremity Assessment Upper Extremity Assessment: Overall WFL for tasks assessed           Communication Communication Communication: No difficulties   Cognition Arousal/Alertness: Awake/alert Behavior During Therapy: WFL for tasks assessed/performed Overall Cognitive Status: History of cognitive impairments - at baseline                     General Comments       Exercises       Shoulder Instructions      Home Living  Family/patient expects to be discharged to:: Private residence Living Arrangements: Children;Other relatives (son and his family) Available Help at Discharge: Family;Available 24 hours/day Type of Home: Apartment Home Access: Level entry     Home Layout: One level     Bathroom Shower/Tub: Tub/shower unit Shower/tub characteristics: Curtain Biochemist, clinical: Standard     Home Equipment: Environmental consultant - 2 wheels;Shower seat;Grab bars - tub/shower;Hand held shower head   Additional Comments:  (Pt. would wash LE in shower and would do sponge bath for UE )      Prior Functioning/Environment Level of Independence: Independent;Needs assistance  Gait / Transfers Assistance Needed: does not use AD.  1 near fall over the past 6 months  ADL's / Homemaking Assistance Needed: Needs assist for sponge bath   Comments:  (Pt. family did not want to get HD port wet.)    OT Diagnosis:     OT  Problem List:     OT Treatment/Interventions:      OT Goals(Current goals can be found in the care plan section) Acute Rehab OT Goals Patient Stated Goal: go home  OT Frequency:     Barriers to D/C:            Co-evaluation              End of Session    Activity Tolerance: Patient tolerated treatment well Patient left: in bed;with call bell/phone within reach   Time: 1130-1210 OT Time Calculation (min): 40 min Charges:  OT General Charges $OT Visit: 1 Procedure OT Evaluation $OT Eval Low Complexity: 1 Procedure OT Treatments $Self Care/Home Management : 23-37 mins G-Codes:    Lenn Volker 01/14/16, 12:55 PM

## 2016-01-14 DIAGNOSIS — M7989 Other specified soft tissue disorders: Secondary | ICD-10-CM | POA: Insufficient documentation

## 2016-01-14 LAB — WOUND CULTURE
Culture: NORMAL
GRAM STAIN: NONE SEEN

## 2016-01-14 LAB — CBC
HCT: 31.1 % — ABNORMAL LOW (ref 39.0–52.0)
Hemoglobin: 9.9 g/dL — ABNORMAL LOW (ref 13.0–17.0)
MCH: 32 pg (ref 26.0–34.0)
MCHC: 31.8 g/dL (ref 30.0–36.0)
MCV: 100.6 fL — ABNORMAL HIGH (ref 78.0–100.0)
PLATELETS: 108 10*3/uL — AB (ref 150–400)
RBC: 3.09 MIL/uL — AB (ref 4.22–5.81)
RDW: 15.8 % — AB (ref 11.5–15.5)
WBC: 8.7 10*3/uL (ref 4.0–10.5)

## 2016-01-14 LAB — PROTIME-INR
INR: 3 — ABNORMAL HIGH (ref 0.00–1.49)
PROTHROMBIN TIME: 30.6 s — AB (ref 11.6–15.2)

## 2016-01-14 LAB — LEGIONELLA PNEUMOPHILA SEROGP 1 UR AG: L. pneumophila Serogp 1 Ur Ag: NEGATIVE

## 2016-01-14 LAB — GLUCOSE, CAPILLARY
GLUCOSE-CAPILLARY: 130 mg/dL — AB (ref 65–99)
GLUCOSE-CAPILLARY: 139 mg/dL — AB (ref 65–99)
Glucose-Capillary: 104 mg/dL — ABNORMAL HIGH (ref 65–99)
Glucose-Capillary: 172 mg/dL — ABNORMAL HIGH (ref 65–99)

## 2016-01-14 LAB — GC/CHLAMYDIA PROBE AMP (~~LOC~~) NOT AT ARMC
CHLAMYDIA, DNA PROBE: NEGATIVE
NEISSERIA GONORRHEA: NEGATIVE

## 2016-01-14 MED ORDER — ACETAMINOPHEN 325 MG PO TABS
650.0000 mg | ORAL_TABLET | ORAL | Status: DC | PRN
  Administered 2016-01-14 – 2016-01-19 (×6): 650 mg via ORAL
  Filled 2016-01-14 (×5): qty 2

## 2016-01-14 MED ORDER — WARFARIN SODIUM 1 MG PO TABS
1.0000 mg | ORAL_TABLET | Freq: Once | ORAL | Status: AC
  Administered 2016-01-14: 1 mg via ORAL
  Filled 2016-01-14: qty 1

## 2016-01-14 MED ORDER — GABAPENTIN 300 MG PO CAPS
300.0000 mg | ORAL_CAPSULE | Freq: Every day | ORAL | Status: DC
  Administered 2016-01-15 – 2016-01-18 (×4): 300 mg via ORAL
  Filled 2016-01-14 (×4): qty 1

## 2016-01-14 MED ORDER — ACYCLOVIR 200 MG PO CAPS
200.0000 mg | ORAL_CAPSULE | Freq: Two times a day (BID) | ORAL | Status: DC
Start: 2016-01-14 — End: 2016-01-17
  Administered 2016-01-14 – 2016-01-16 (×5): 200 mg via ORAL
  Filled 2016-01-14 (×5): qty 1

## 2016-01-14 MED ORDER — CARVEDILOL 6.25 MG PO TABS
6.2500 mg | ORAL_TABLET | Freq: Two times a day (BID) | ORAL | Status: DC
  Administered 2016-01-14 – 2016-01-16 (×4): 6.25 mg via ORAL
  Filled 2016-01-14 (×4): qty 1

## 2016-01-14 NOTE — Progress Notes (Signed)
Pt. On room air ( says MD took off o2) checked 02 sats = 79%, placed on 1.5 L saturation = 96% Will continue to monitor.

## 2016-01-14 NOTE — Progress Notes (Signed)
ANTICOAGULATION & ANTIBIOTIC CONSULT NOTE - Follow Up Consult  Pharmacy Consult for Warfarin & Acyclovir Indication: atrial fibrillation & r/o penile HSV  No Known Allergies  Patient Measurements: Height: 5\' 6"  (167.6 cm) Weight: 174 lb 2.6 oz (79 kg) IBW/kg (Calculated) : 63.8  Vital Signs: Temp: 99 F (37.2 C) (03/20 1000) Temp Source: Oral (03/20 1000) BP: 98/64 mmHg (03/20 1000) Pulse Rate: 66 (03/20 1000)  Labs:  Recent Labs  01/11/16 1850 01/12/16 0604 01/12/16 1917 01/13/16 0557 01/14/16 0557  HGB 10.1*  --  9.1*  --  9.9*  HCT 30.5*  --  29.0*  --  31.1*  PLT 93*  --  119*  --  108*  LABPROT  --  24.3*  --  25.7* 30.6*  INR  --  2.20*  --  2.37* 3.00*  CREATININE 5.23*  --  6.46*  --   --     Estimated Creatinine Clearance: 11.6 mL/min (by C-G formula based on Cr of 6.46).   Assessment: 61 YOM who presented on 3/17 with SOB and penile ulcers. PTA the patient was on warfarin for hx Afib/CVA - pharmacy consulted to resume this admit. Per the coumadin clinic notes, the patient's INR goal of 2-2.5 on a PTA dose of 2.5 mg daily EXCEPT for 5 mg on MWF.   INR today is SUPRAtherapeutic of lower INR goal specified by the patient's coumadin clinic (INR 3 << 2.37, goal of 2-2.5). The patient was noted to have started on Ceftin po on 3/19 which may increase warfarin sensitivity. Hgb/Hct stable, plts 108 << 119 - no overt s/sx of bleeding noted. Will give a low dose of warfarin this evening.    The patient is noted to be taking orals well - will adjust Acyclovir to po today and will sign off since no further dose adjustments are expected with the patient's known ESRD.   Goal of Therapy:  INR 2-2.5   Plan:  1. Warfarin 1 mg x 1 dose at 1800 today 2. Change Acyclovir to 200 mg po bid  3. Will continue to monitor for any signs/symptoms of bleeding and will follow up with PT/INR in the a.m.  4. Pharmacy will sign off on Acyclovir dosing - no further dose adjustments  expected with ESRD  Alycia Rossetti, PharmD, BCPS Clinical Pharmacist Pager: [phone] 01/14/2016 11:21 AM

## 2016-01-14 NOTE — Progress Notes (Signed)
[facility] PROGRESS NOTE  Tony Freeman [id number] DOB: 1950/12/23 DOA: 01/10/2016 PCP: Tanya Nones, MD  HPI/Brief narrative 65 year old African-American male with a past medical history of end-stage renal disease on hemodialysis, hypertension, hyperlipidemia, diabetes, coronary artery disease, chronic systolic and diastolic CHF, AICD placement, paroxysmal atrial fibrillation on Coumadin, presented with shortness of breath and ulcer on his penis. He was hospitalized for management of possible healthcare associated pneumonia and penile ulcer.  Assessment/Plan: Healthcare associated pneumonia with right-sided pleural effusion Patient was continued on vancomycin and cefepime. Blood cultures are neg so far. Urine for strep pneumonia antigen is negative. Influenza PCR is negative. Symptomatically improving. Will need follow-up chest x-ray in a few weeks' time to ensure resolution of the effusion. Will transition to PO abx today  Penile ulcer Etiology is unclear. HIV is nonreactive. RPR is nonreactive. Continue empiric acyclovir treatment. GC/chlamydia probe is pending. 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 This AM, pt reports lesion seems to be somewhat improved. Discussed with Nephrology, no indication for biopsy at this time. Continue to monitor  History of coronary artery disease with mildly elevated troponin Stable. Patient denies any chest pain. Mildly elevated troponin, likely due to renal failure. No need for further workup at this time.  DM-II Continue SSI. Monitor CBGs.  Essential Hypertension Continue Coreg, Cozaar  Chronic combined systolic and diastolic CHF (congestive heart failure)  2-D echo on 11/29/15 showed EF 15-20 percent. Patient has AICD placement. No leg edema. Volume is managed by renal via dialysis. CHF is compensated. Continue antiplatelet agent.  Atrial Fibrillation CHA2DS2-VASc Score is 7, needs oral anticoagulation. Patient is on Coumadin at  home. INR is therapeutic. Continue Coreg and amiodarone.  Continue Coumadin per pharmacy  HLD Continue home medications: Lipitor  ESRD-HD (TTS) Nephrology is following. To be dialyzed on TTS schedule  Code Status: Full Family Communication: Pt in room Disposition Plan: Possible d/c within 24hrs   Consultants:  Nephrology  Procedures:    Antibiotics: Anti-infectives    Start     Dose/Rate Route Frequency Ordered Stop   01/14/16 2000  acyclovir (ZOVIRAX) 200 MG capsule 200 mg     200 mg Oral 2 times daily 01/14/16 1131     01/13/16 1800  cefUROXime (CEFTIN) tablet 500 mg     500 mg Oral Every 24 hours 01/13/16 1528     01/13/16 1700  cefUROXime (CEFTIN) tablet 250 mg  Status:  Discontinued     250 mg Oral 2 times daily with meals 01/13/16 1521 01/13/16 1528   01/12/16 1800  ceFEPIme (MAXIPIME) 2 g in dextrose 5 % 50 mL IVPB  Status:  Discontinued     2 g 100 mL/hr over 30 Minutes Intravenous Every T-Th-Sa (1800) 01/10/16 2325 01/13/16 1521   01/12/16 1200  vancomycin (VANCOCIN) IVPB 1000 mg/200 mL premix  Status:  Discontinued     1,000 mg 200 mL/hr over 60 Minutes Intravenous Every T-Th-Sa (Hemodialysis) 01/10/16 2325 01/13/16 1521   01/11/16 2200  acyclovir (ZOVIRAX) 200 mg in dextrose 5 % 100 mL IVPB  Status:  Discontinued     200 mg 104 mL/hr over 60 Minutes Intravenous Every 24 hours 01/10/16 2330 01/14/16 1130   01/10/16 2345  acyclovir (ZOVIRAX) 200 mg in dextrose 5 % 100 mL IVPB     200 mg 104 mL/hr over 60 Minutes Intravenous  Once 01/10/16 2330 01/11/16 0202   01/10/16 2145  ceFEPIme (MAXIPIME) 2 g in dextrose 5 % 50 mL IVPB     2 g  100 mL/hr over 30 Minutes Intravenous  Once 01/10/16 2132 01/11/16 0256   01/10/16 2130  vancomycin (VANCOCIN) 1,750 mg in sodium chloride 0.9 % 500 mL IVPB     1,750 mg 250 mL/hr over 120 Minutes Intravenous  Once 01/10/16 2128 01/11/16 0004   01/10/16 2100  vancomycin (VANCOCIN) IVPB 1000 mg/200 mL premix  Status:  Discontinued      1,000 mg 200 mL/hr over 60 Minutes Intravenous  Once 01/10/16 2043 01/10/16 2128   01/10/16 2045  ceFEPIme (MAXIPIME) 1 g in dextrose 5 % 50 mL IVPB  Status:  Discontinued     1 g 100 mL/hr over 30 Minutes Intravenous  Once 01/10/16 2034 01/10/16 2132      HPI/Subjective: Reports pain seems improved  Objective: Filed Vitals:   01/13/16 1751 01/14/16 0535 01/14/16 0540 01/14/16 1000  BP: 95/73 92/58  98/64  Pulse: 82 65  66  Temp: 97.6 F (36.4 C) 98.7 F (37.1 C)  99 F (37.2 C)  TempSrc: Axillary Oral  Oral  Resp: 16 16  18   Height:      Weight:      SpO2: 97% 68% 93% 96%    Intake/Output Summary (Last 24 hours) at 01/14/16 1627 Last data filed at 01/14/16 0900  Gross per 24 hour  Intake    340 ml  Output      0 ml  Net    340 ml   Filed Weights   01/12/16 1845 01/12/16 2100 01/12/16 2150  Weight: 81.2 kg (179 lb 0.2 oz) 79 kg (174 lb 2.6 oz) 79 kg (174 lb 2.6 oz)    Exam:   General:  Awake, in nad  Cardiovascular: regular, s1, s2  Respiratory: normal resp effort, no wheezing  Abdomen: soft, nondistended, pos BS  Musculoskeletal: perfused, no clubbing, no cyanosis  Data Reviewed: Basic Metabolic Panel:  Recent Labs Lab 01/10/16 2005 01/11/16 1850 01/12/16 1917  NA 137 135 135  K 4.1 4.4 4.2  CL 93* 94* 96*  CO2 30 23 27   GLUCOSE 138* 78 121*  BUN 14 29* 42*  CREATININE 3.71* 5.23* 6.46*  CALCIUM 9.6 9.6 9.5  PHOS  --  5.4* 5.3*   Liver Function Tests:  Recent Labs Lab 01/11/16 1850 01/12/16 1917  ALBUMIN 3.4* 3.1*   No results for input(s): LIPASE, AMYLASE in the last 168 hours. No results for input(s): AMMONIA in the last 168 hours. CBC:  Recent Labs Lab 01/10/16 2005 01/11/16 1850 01/12/16 1917 01/14/16 0557  WBC 5.3 4.9 6.5 8.7  NEUTROABS 3.5  --   --   --   HGB 10.6* 10.1* 9.1* 9.9*  HCT 32.7* 30.5* 29.0* 31.1*  MCV 98.5 96.8 98.0 100.6*  PLT 153 93* 119* 108*   Cardiac Enzymes:  Recent Labs Lab 01/11/16 0010  01/11/16 0716 01/11/16 0838  TROPONINI 0.09* 0.10* 0.10*   BNP (last 3 results)  Recent Labs  01/10/16 2006  BNP 4360.3*    ProBNP (last 3 results) No results for input(s): PROBNP in the last 8760 hours.  CBG:  Recent Labs Lab 01/12/16 2232 01/13/16 0810 01/13/16 2046 01/14/16 0753 01/14/16 1132  GLUCAP 105* 89 119* 104* 172*    Recent Results (from the past 240 hour(s))  Culture, blood (routine x 2) Call MD if unable to obtain prior to antibiotics being given     Status: None (Preliminary result)   Collection Time: 01/11/16 12:05 AM  Result Value Ref Range Status   Specimen Description BLOOD  RIGHT FOREARM  Final   Special Requests BOTTLES DRAWN AEROBIC ONLY 7ML  Final   Culture NO GROWTH 3 DAYS  Final   Report Status PENDING  Incomplete  Culture, blood (routine x 2) Call MD if unable to obtain prior to antibiotics being given     Status: None (Preliminary result)   Collection Time: 01/11/16 12:10 AM  Result Value Ref Range Status   Specimen Description BLOOD RIGHT HAND  Final   Special Requests BOTTLES DRAWN AEROBIC AND ANAEROBIC 5ML  Final   Culture NO GROWTH 3 DAYS  Final   Report Status PENDING  Incomplete  Wound culture     Status: None   Collection Time: 01/11/16 12:40 AM  Result Value Ref Range Status   Specimen Description ULCER  Final   Special Requests PENIS  Final   Gram Stain   Final    NO WBC SEEN NO SQUAMOUS EPITHELIAL CELLS SEEN NO ORGANISMS SEEN Performed at [facility]    Culture   Final    NORMAL SKIN FLORA Performed at [facility]    Report Status 01/14/2016 FINAL  Final  MRSA PCR Screening     Status: None   Collection Time: 01/11/16 12:40 AM  Result Value Ref Range Status   MRSA by PCR NEGATIVE NEGATIVE Final    Comment:        The GeneXpert MRSA Assay (FDA approved for NASAL specimens only), is one component of a comprehensive MRSA colonization surveillance program. It is not intended to diagnose  MRSA infection nor to guide or monitor treatment for MRSA infections.      Studies: No results found.  Scheduled Meds: . acyclovir  200 mg Oral BID  . amiodarone  100 mg Oral Daily  . atorvastatin  40 mg Oral Daily  . carvedilol  6.25 mg Oral BID WC  . cefUROXime  500 mg Oral Q24H  . clopidogrel  75 mg Oral Daily  . dextromethorphan-guaiFENesin  1 tablet Oral BID  . feeding supplement (NEPRO CARB STEADY)  237 mL Oral BID BM  . [START ON 01/17/2016] ferric gluconate (FERRLECIT/NULECIT) IV  62.5 mg Intravenous Q Thu-HD  . [START ON 01/15/2016] gabapentin  300 mg Oral QHS  . insulin aspart  0-5 Units Subcutaneous QHS  . insulin aspart  0-9 Units Subcutaneous TID WC  . lanthanum  1,000 mg Oral TID WC  . mometasone-formoterol  2 puff Inhalation BID  . multivitamin with minerals  1 tablet Oral Daily  . warfarin  1 mg Oral ONCE-1800  . Warfarin - Pharmacist Dosing Inpatient   Does not apply q1800   Continuous Infusions:   Principal Problem:   HCAP (healthcare-associated pneumonia) Active Problems:   Elevated troponin   DM (diabetes mellitus), type 2, uncontrolled, with renal complications (HCC)   Essential hypertension, benign   Chronic combined systolic and diastolic CHF (congestive heart failure) (HCC)   CAD (coronary artery disease)   PAF (paroxysmal atrial fibrillation) (HCC)   Atrial fibrillation (HCC)   HLD (hyperlipidemia)   ESRD (end stage renal disease) on dialysis ([place])   Ulcer of penis    [facility], [facility] Pager [phone]. If 7PM-7AM, please contact night-coverage at [email or web address], password [facility] 01/14/2016, 4:27 PM  LOS: 4 days

## 2016-01-14 NOTE — Progress Notes (Signed)
[facility] Progress Note  Assessment/Plan: 1. HCAP with R. Pleural Effusion: Per primary. Initially on Vanc/Zosyn. Changed to Ceftin yesterday. No fevers. WBC 8.7 BC neg, FLU A, B and H1N1 neg.  2. ESRD -TTS at [facility]. For HD tomorrow.  3. Anemia - Hgb 9.9 OP ESA dose given 03/14. Follow HGB 4. Secondary hyperparathyroidism - Continue binders, no VDRA. Ca 9.5 C Ca 10.22 phos 5.3. No calcium based binders due to possibly calciphylaxis on penis. 2.0 Ca bath.  5. HTN/volume - BP soft. Reduce coreg to 6.25 mg PO BID, dc losartan. Last HD 03/18 net uf 2000. Post wt 79 kgs, OP EDW 79.5 kg. HD tomorrow UFG 1.5-2 liters 6. Nutrition - albumin 3.1 7. Penile ulcer: per primary. Possible calciphylaxis-however is being treated empirically with acyclovir per pharmacy. 8. Afib: DM per primary. CHA2DS2-VASc Score 7. Coumadin per pharmacy INR 3.0 9. DM: per primary 10. 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 Has ICD. Dual chamber pacemaker.   [name] NP-C 01/14/2016, 10:20 AM  [facility] [phone]  Pt seen, examined, agree w assess/plan as above with additions as indicated.  His penile lesion is a small necrotic lesion and could be calciphylaxis.  Doesn't look like a vesicular lesion. He says it's getting better but he is also over-sedated from narcotics.  Have stopped all narcotics, needs to wake up and get OOB.  Not sure if a skin biopsy is indicated yet, will follow.   Kelly Splinter MD [employer] pager [phone]    cell [phone] 01/14/2016, 3:24 PM     Subjective: "I feel a little better" Patient initially staring off into space, did not respond to verbal. Now alert answering questions appropriately. Denies SOB. Gown smeared with brown sputum from productive cough.     Objective Filed Vitals:   01/13/16 0923 01/13/16 1751 01/14/16 0535 01/14/16 0540  BP:  95/73 92/58   Pulse:  82 65   Temp:  97.6 F (36.4 C) 98.7 F (37.1 C)    TempSrc:  Axillary Oral   Resp:  16 16   Height:      Weight:      SpO2: 92% 97% 68% 93%   Physical Exam General: Well nourished, NAD Heart: S1, S2 II/VI systolic M AV pacing on monitor.  Mild JVD Lungs: Bilateral breath sounds with scattered coarse breath sounds, no wheezing Abdomen: soft nontender Extremities: No LE edema Dialysis Access: R perm cath Drsg CDI nonmaturing LUA AVF Dialysis Orders: [facility]   TTS 4.25 hr 400/800 EDW 79.5 2 K 2.25 Ca profile 4, heparin 2600 units venofer 50mg   3/16 Mircera 75 mcg q 2 weeks   Additional Objective Labs: Basic Metabolic Panel:  Recent Labs Lab 01/10/16 2005 01/11/16 1850 01/12/16 1917  NA 137 135 135  K 4.1 4.4 4.2  CL 93* 94* 96*  CO2 30 23 27   GLUCOSE 138* 78 121*  BUN 14 29* 42*  CREATININE 3.71* 5.23* 6.46*  CALCIUM 9.6 9.6 9.5  PHOS  --  5.4* 5.3*   Liver Function Tests:  Recent Labs Lab 01/11/16 1850 01/12/16 1917  ALBUMIN 3.4* 3.1*   No results for input(s): LIPASE, AMYLASE in the last 168 hours. CBC:  Recent Labs Lab 01/10/16 2005 01/11/16 1850 01/12/16 1917 01/14/16 0557  WBC 5.3 4.9 6.5 8.7  NEUTROABS 3.5  --   --   --   HGB 10.6* 10.1* 9.1* 9.9*  HCT 32.7* 30.5* 29.0* 31.1*  MCV 98.5 96.8 98.0 100.6*  PLT 153 93* 119* 108*   Blood Culture    Component Value Date/Time   SDES ULCER 01/11/2016 0040   SPECREQUEST PENIS 01/11/2016 0040   CULT  01/11/2016 0040    NORMAL SKIN FLORA Performed at [facility] 01/14/2016 FINAL 01/11/2016 0040    Cardiac Enzymes:  Recent Labs Lab 01/11/16 0010 01/11/16 0716 01/11/16 0838  TROPONINI 0.09* 0.10* 0.10*   CBG:  Recent Labs Lab 01/12/16 1651 01/12/16 2232 01/13/16 0810 01/13/16 2046 01/14/16 0753  GLUCAP 99 105* 89 119* 104*   Iron Studies: No results for input(s): IRON, TIBC, TRANSFERRIN, FERRITIN in the last 72 hours. @lablastinr3 @ Studies/Results: No results found. Medications:   . acyclovir  200 mg  Intravenous Q24H  . amiodarone  100 mg Oral Daily  . atorvastatin  40 mg Oral Daily  . carvedilol  9.375 mg Oral BID WC  . cefUROXime  500 mg Oral Q24H  . clopidogrel  75 mg Oral Daily  . dextromethorphan-guaiFENesin  1 tablet Oral BID  . feeding supplement (NEPRO CARB STEADY)  237 mL Oral BID BM  . [START ON 01/17/2016] ferric gluconate (FERRLECIT/NULECIT) IV  62.5 mg Intravenous Q Thu-HD  . gabapentin  300 mg Oral Daily  . insulin aspart  0-5 Units Subcutaneous QHS  . insulin aspart  0-9 Units Subcutaneous TID WC  . lanthanum  1,000 mg Oral TID WC  . losartan  25 mg Oral Daily  . mometasone-formoterol  2 puff Inhalation BID  . multivitamin with minerals  1 tablet Oral Daily  . warfarin  2.5 mg Oral Q T,Th,S,Su-1800  . warfarin  5 mg Oral Q M,W,F-1800  . Warfarin - Pharmacist Dosing Inpatient   Does not apply [phone]

## 2016-01-14 NOTE — Progress Notes (Signed)
[facility] PROGRESS NOTE  Tony Freeman [id number] DOB: [birth year] DOA: 01/10/2016 PCP: Tanya Nones, MD  HPI/Brief narrative 65 year old African-American male with a past medical history of end-stage renal disease on hemodialysis, hypertension, hyperlipidemia, diabetes, coronary artery disease, chronic systolic and diastolic CHF, AICD placement, paroxysmal atrial fibrillation on Coumadin, presented with shortness of breath and ulcer on his penis. He was hospitalized for management of possible healthcare associated pneumonia and penile ulcer.  Assessment/Plan: Healthcare associated pneumonia with right-sided pleural effusion Patient was continued on vancomycin and cefepime. Blood cultures are neg so far. Urine for strep pneumonia antigen is negative. Influenza PCR is negative. Symptomatically improving. Will need follow-up chest x-ray in a few weeks' time to ensure resolution of the effusion. Will transition to PO abx today  Penile ulcer Etiology is unclear. HIV is nonreactive. RPR is nonreactive. Continue empiric acyclovir treatment. GC/chlamydia probe is pending. 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 Discussed with Nephrology - recs for biopsy of lesion. This AM, pt reports lesion seems to be somewhat   History of coronary artery disease with mildly elevated troponin Stable. Patient denies any chest pain. Mildly elevated troponin, likely due to renal failure. No need for further workup at this time.  DM-II Continue SSI. Monitor CBGs.  Essential Hypertension Continue Coreg, Cozaar  Chronic combined systolic and diastolic CHF (congestive heart failure)  2-D echo on 11/29/15 showed EF 15-20 percent. Patient has AICD placement. No leg edema. Volume is managed by renal via dialysis. CHF is compensated. Continue antiplatelet agent.  Atrial Fibrillation CHA2DS2-VASc Score is 7, needs oral anticoagulation. Patient is on Coumadin at home. INR is therapeutic. Continue Coreg  and amiodarone.  Continue Coumadin per pharmacy  HLD Continue home medications: Lipitor  ESRD-HD (TTS) Nephrology is following. To be dialyzed on TTS schedule  Code Status: Full Family Communication: Pt in room Disposition Plan: Possible d/c within 24hrs   Consultants:  Nephrology  Procedures:    Antibiotics: Anti-infectives    Start     Dose/Rate Route Frequency Ordered Stop   01/14/16 2000  acyclovir (ZOVIRAX) 200 MG capsule 200 mg     200 mg Oral 2 times daily 01/14/16 1131     01/13/16 1800  cefUROXime (CEFTIN) tablet 500 mg     500 mg Oral Every 24 hours 01/13/16 1528     01/13/16 1700  cefUROXime (CEFTIN) tablet 250 mg  Status:  Discontinued     250 mg Oral 2 times daily with meals 01/13/16 1521 01/13/16 1528   01/12/16 1800  ceFEPIme (MAXIPIME) 2 g in dextrose 5 % 50 mL IVPB  Status:  Discontinued     2 g 100 mL/hr over 30 Minutes Intravenous Every T-Th-Sa (1800) 01/10/16 2325 01/13/16 1521   01/12/16 1200  vancomycin (VANCOCIN) IVPB 1000 mg/200 mL premix  Status:  Discontinued     1,000 mg 200 mL/hr over 60 Minutes Intravenous Every T-Th-Sa (Hemodialysis) 01/10/16 2325 01/13/16 1521   01/11/16 2200  acyclovir (ZOVIRAX) 200 mg in dextrose 5 % 100 mL IVPB  Status:  Discontinued     200 mg 104 mL/hr over 60 Minutes Intravenous Every 24 hours 01/10/16 2330 01/14/16 1130   01/10/16 2345  acyclovir (ZOVIRAX) 200 mg in dextrose 5 % 100 mL IVPB     200 mg 104 mL/hr over 60 Minutes Intravenous  Once 01/10/16 2330 01/11/16 0202   01/10/16 2145  ceFEPIme (MAXIPIME) 2 g in dextrose 5 % 50 mL IVPB     2 g 100 mL/hr over 30  Minutes Intravenous  Once 01/10/16 2132 01/11/16 0256   01/10/16 2130  vancomycin (VANCOCIN) 1,750 mg in sodium chloride 0.9 % 500 mL IVPB     1,750 mg 250 mL/hr over 120 Minutes Intravenous  Once 01/10/16 2128 01/11/16 0004   01/10/16 2100  vancomycin (VANCOCIN) IVPB 1000 mg/200 mL premix  Status:  Discontinued     1,000 mg 200 mL/hr over 60 Minutes  Intravenous  Once 01/10/16 2043 01/10/16 2128   01/10/16 2045  ceFEPIme (MAXIPIME) 1 g in dextrose 5 % 50 mL IVPB  Status:  Discontinued     1 g 100 mL/hr over 30 Minutes Intravenous  Once 01/10/16 2034 01/10/16 2132      HPI/Subjective: Reports pain seems improved  Objective: Filed Vitals:   01/13/16 1751 01/14/16 0535 01/14/16 0540 01/14/16 1000  BP: 95/73 92/58  98/64  Pulse: 82 65  66  Temp: 97.6 F (36.4 C) 98.7 F (37.1 C)  99 F (37.2 C)  TempSrc: Axillary Oral  Oral  Resp: 16 16  18   Height:      Weight:      SpO2: 97% 68% 93% 96%    Intake/Output Summary (Last 24 hours) at 01/14/16 1224 Last data filed at 01/14/16 0900  Gross per 24 hour  Intake    340 ml  Output      0 ml  Net    340 ml   Filed Weights   01/12/16 1845 01/12/16 2100 01/12/16 2150  Weight: 81.2 kg (179 lb 0.2 oz) 79 kg (174 lb 2.6 oz) 79 kg (174 lb 2.6 oz)    Exam:   General:  Awake, in nad  Cardiovascular: regular, s1, s2  Respiratory: normal resp effort, no wheezing  Abdomen: soft, nondistended, pos BS  Musculoskeletal: perfused, no clubbing, no cyanosis  Data Reviewed: Basic Metabolic Panel:  Recent Labs Lab 01/10/16 2005 01/11/16 1850 01/12/16 1917  NA 137 135 135  K 4.1 4.4 4.2  CL 93* 94* 96*  CO2 30 23 27   GLUCOSE 138* 78 121*  BUN 14 29* 42*  CREATININE 3.71* 5.23* 6.46*  CALCIUM 9.6 9.6 9.5  PHOS  --  5.4* 5.3*   Liver Function Tests:  Recent Labs Lab 01/11/16 1850 01/12/16 1917  ALBUMIN 3.4* 3.1*   No results for input(s): LIPASE, AMYLASE in the last 168 hours. No results for input(s): AMMONIA in the last 168 hours. CBC:  Recent Labs Lab 01/10/16 2005 01/11/16 1850 01/12/16 1917 01/14/16 0557  WBC 5.3 4.9 6.5 8.7  NEUTROABS 3.5  --   --   --   HGB 10.6* 10.1* 9.1* 9.9*  HCT 32.7* 30.5* 29.0* 31.1*  MCV 98.5 96.8 98.0 100.6*  PLT 153 93* 119* 108*   Cardiac Enzymes:  Recent Labs Lab 01/11/16 0010 01/11/16 0716 01/11/16 0838   TROPONINI 0.09* 0.10* 0.10*   BNP (last 3 results)  Recent Labs  01/10/16 2006  BNP 4360.3*    ProBNP (last 3 results) No results for input(s): PROBNP in the last 8760 hours.  CBG:  Recent Labs Lab 01/12/16 2232 01/13/16 0810 01/13/16 2046 01/14/16 0753 01/14/16 1132  GLUCAP 105* 89 119* 104* 172*    Recent Results (from the past 240 hour(s))  Culture, blood (routine x 2) Call MD if unable to obtain prior to antibiotics being given     Status: None (Preliminary result)   Collection Time: 01/11/16 12:05 AM  Result Value Ref Range Status   Specimen Description BLOOD RIGHT FOREARM  Final  Special Requests BOTTLES DRAWN AEROBIC ONLY 7ML  Final   Culture NO GROWTH 2 DAYS  Final   Report Status PENDING  Incomplete  Culture, blood (routine x 2) Call MD if unable to obtain prior to antibiotics being given     Status: None (Preliminary result)   Collection Time: 01/11/16 12:10 AM  Result Value Ref Range Status   Specimen Description BLOOD RIGHT HAND  Final   Special Requests BOTTLES DRAWN AEROBIC AND ANAEROBIC 5ML  Final   Culture NO GROWTH 2 DAYS  Final   Report Status PENDING  Incomplete  Wound culture     Status: None   Collection Time: 01/11/16 12:40 AM  Result Value Ref Range Status   Specimen Description ULCER  Final   Special Requests PENIS  Final   Gram Stain   Final    NO WBC SEEN NO SQUAMOUS EPITHELIAL CELLS SEEN NO ORGANISMS SEEN Performed at [facility]    Culture   Final    NORMAL SKIN FLORA Performed at [facility]    Report Status 01/14/2016 FINAL  Final  MRSA PCR Screening     Status: None   Collection Time: 01/11/16 12:40 AM  Result Value Ref Range Status   MRSA by PCR NEGATIVE NEGATIVE Final    Comment:        The GeneXpert MRSA Assay (FDA approved for NASAL specimens only), is one component of a comprehensive MRSA colonization surveillance program. It is not intended to diagnose MRSA infection nor to guide or monitor  treatment for MRSA infections.      Studies: No results found.  Scheduled Meds: . acyclovir  200 mg Oral BID  . amiodarone  100 mg Oral Daily  . atorvastatin  40 mg Oral Daily  . carvedilol  6.25 mg Oral BID WC  . cefUROXime  500 mg Oral Q24H  . clopidogrel  75 mg Oral Daily  . dextromethorphan-guaiFENesin  1 tablet Oral BID  . feeding supplement (NEPRO CARB STEADY)  237 mL Oral BID BM  . [START ON 01/17/2016] ferric gluconate (FERRLECIT/NULECIT) IV  62.5 mg Intravenous Q Thu-HD  . [START ON 01/15/2016] gabapentin  300 mg Oral QHS  . insulin aspart  0-5 Units Subcutaneous QHS  . insulin aspart  0-9 Units Subcutaneous TID WC  . lanthanum  1,000 mg Oral TID WC  . mometasone-formoterol  2 puff Inhalation BID  . multivitamin with minerals  1 tablet Oral Daily  . warfarin  1 mg Oral ONCE-1800  . Warfarin - Pharmacist Dosing Inpatient   Does not apply q1800   Continuous Infusions:   Principal Problem:   HCAP (healthcare-associated pneumonia) Active Problems:   Elevated troponin   DM (diabetes mellitus), type 2, uncontrolled, with renal complications (HCC)   Essential hypertension, benign   Chronic combined systolic and diastolic CHF (congestive heart failure) (HCC)   CAD (coronary artery disease)   PAF (paroxysmal atrial fibrillation) (HCC)   Atrial fibrillation (HCC)   HLD (hyperlipidemia)   ESRD (end stage renal disease) on dialysis ([place])   Ulcer of penis    [facility], [facility] Pager [phone]. If 7PM-7AM, please contact night-coverage at [email or web address], password [facility] 01/14/2016, 12:24 PM  LOS: 4 days

## 2016-01-15 LAB — CBC
HEMATOCRIT: 29.2 % — AB (ref 39.0–52.0)
Hemoglobin: 9.9 g/dL — ABNORMAL LOW (ref 13.0–17.0)
MCH: 33.3 pg (ref 26.0–34.0)
MCHC: 33.9 g/dL (ref 30.0–36.0)
MCV: 98.3 fL (ref 78.0–100.0)
PLATELETS: 105 10*3/uL — AB (ref 150–400)
RBC: 2.97 MIL/uL — ABNORMAL LOW (ref 4.22–5.81)
RDW: 15.8 % — AB (ref 11.5–15.5)
WBC: 5.7 10*3/uL (ref 4.0–10.5)

## 2016-01-15 LAB — BASIC METABOLIC PANEL
Anion gap: 13 (ref 5–15)
BUN: 56 mg/dL — AB (ref 6–20)
CALCIUM: 9.5 mg/dL (ref 8.9–10.3)
CO2: 24 mmol/L (ref 22–32)
Chloride: 94 mmol/L — ABNORMAL LOW (ref 101–111)
Creatinine, Ser: 8.56 mg/dL — ABNORMAL HIGH (ref 0.61–1.24)
GFR calc Af Amer: 7 mL/min — ABNORMAL LOW (ref >60)
GFR, EST NON AFRICAN AMERICAN: 6 mL/min — AB (ref >60)
GLUCOSE: 107 mg/dL — AB (ref 65–99)
POTASSIUM: 5.5 mmol/L — AB (ref 3.5–5.1)
SODIUM: 131 mmol/L — AB (ref 135–145)

## 2016-01-15 LAB — GLUCOSE, CAPILLARY
GLUCOSE-CAPILLARY: 88 mg/dL (ref 65–99)
GLUCOSE-CAPILLARY: 156 mg/dL — AB (ref 65–99)
Glucose-Capillary: 145 mg/dL — ABNORMAL HIGH (ref 65–99)

## 2016-01-15 LAB — PROTIME-INR
INR: 3.29 — AB (ref 0.00–1.49)
PROTHROMBIN TIME: 32.8 s — AB (ref 11.6–15.2)

## 2016-01-15 MED ORDER — GABAPENTIN 300 MG PO CAPS: 300.0000 mg | ORAL_CAPSULE | Freq: Every day | ORAL | Status: DC

## 2016-01-15 MED ORDER — CEFUROXIME AXETIL 500 MG PO TABS: 500.0000 mg | ORAL_TABLET | ORAL | Status: DC

## 2016-01-15 MED ORDER — CARVEDILOL 6.25 MG PO TABS: 6.2500 mg | ORAL_TABLET | Freq: Two times a day (BID) | ORAL | Status: DC

## 2016-01-15 NOTE — Evaluation (Signed)
Physical Therapy Evaluation Patient Details Name: Tony Freeman MRN: [id number] DOB: [birth year] Today's Date: 01/15/2016   History of Present Illness  Pt is a 65 y/o M who presented w/ SOB and a penial ulcer.  Pt's PMH includes ESRD, DM, CAD, s/p stent placement, AICD, CHF, dementia, PVD, stroke.HD  T, TH, SAT  Clinical Impression  Pt is getting up to walk with backward and lateral unsteadiness due to his tingling/painful/numb feet.  Will work toward [place] discharge but due to level of surprising instability asking for SNF.  Will see how he progresses as the week continues and since he has elevated INR and is getting ready for fistulogram will continue to see him to address this.    Follow Up Recommendations SNF    Equipment Recommendations  None recommended by PT    Recommendations for Other Services       Precautions / Restrictions Precautions Precautions: Fall Restrictions Weight Bearing Restrictions: No      Mobility  Bed Mobility Overal bed mobility: Needs Assistance Bed Mobility: Sit to Supine       Sit to supine: Min assist (for his feet)      Transfers Overall transfer level: Needs assistance Equipment used: Rolling walker (2 wheeled);1 person hand held assist Transfers: Sit to/from [facility] Sit to Stand: Mod assist Stand pivot transfers: Min assist (needed direction for setting up to sit safely)          Ambulation/Gait Ambulation/Gait assistance: Min guard;Min assist Ambulation Distance (Feet): 60 Feet Assistive device: Rolling walker (2 wheeled);1 person hand held assist Gait Pattern/deviations: Step-through pattern;Wide base of support;Leaning posteriorly;Shuffle;Decreased stride length Gait velocity: reduced Gait velocity interpretation: Below normal speed for age/gender General Gait Details: HR was 79 max after gait but very unsteady, had to get RW as pt was needing B HHA to walk with PT  Stairs             Wheelchair Mobility    Modified Rankin (Stroke Patients Only)       Balance Overall balance assessment: Needs assistance Sitting-balance support: Feet supported Sitting balance-Leahy Scale: Good     Standing balance support: Bilateral upper extremity supported Standing balance-Leahy Scale: Poor                 High Level Balance Comments: in sharp contrast to eval of 3/18 he could not perform high level balance activity             Pertinent Vitals/Pain Pain Assessment: 0-10 Pain Score: 4  Pain Location: feet Pain Descriptors / Indicators: Tingling Pain Intervention(s): Monitored during session;Other (comment) (added RW to gait, notified nursing)    Home Living Family/patient expects to be discharged to:: Private residence Living Arrangements: Children;Other relatives Available Help at Discharge: Family;Available 24 hours/day Type of Home: Apartment Home Access: Level entry     Home Layout: One level Home Equipment: Walker - 2 wheels;Shower seat;Grab bars - tub/shower;Hand held shower head      Prior Function Level of Independence: Independent   Gait / Transfers Assistance Needed: does not use AD.  1 near fall over the past 6 months   ADL's / Homemaking Assistance Needed: Needs assist for sponge bath        Hand Dominance   Dominant Hand: Right    Extremity/Trunk Assessment   Upper Extremity Assessment: Overall WFL for tasks assessed           Lower Extremity Assessment: Generalized weakness      Cervical /  Trunk Assessment: Normal  Communication   Communication: No difficulties  Cognition Arousal/Alertness: Awake/alert Behavior During Therapy: WFL for tasks assessed/performed Overall Cognitive Status: History of cognitive impairments - at baseline       Memory: Decreased short-term memory (does not remember he owns a RW)              General Comments General comments (skin integrity, edema, etc.): Pt was unsafe with poor  ability to set up transitoin to bed, leaning backward when trying to back up to bed.  Has complaints of tingling pain in LE's that is affecting balance    Exercises        Assessment/Plan    PT Assessment Patient needs continued PT services  PT Diagnosis Difficulty walking   PT Problem List Decreased strength;Decreased range of motion;Decreased activity tolerance;Decreased balance;Decreased mobility;Decreased coordination;Decreased cognition;Decreased knowledge of use of DME;Decreased safety awareness;Cardiopulmonary status limiting activity;Pain  PT Treatment Interventions DME instruction;Gait training;Functional mobility training;Therapeutic activities;Therapeutic exercise;Balance training;Neuromuscular re-education;Patient/family education   PT Goals (Current goals can be found in the Care Plan section) Acute Rehab PT Goals Patient Stated Goal: go home PT Goal Formulation: With patient Time For Goal Achievement: 01/29/16 Potential to Achieve Goals: Good    Frequency Min 3X/week   Barriers to discharge Other (comment) (will need 24/7 mobility assistance with home ) PT planning to see pt to see if HHPT can be follow up but limitation is his pain and tingling in feet making him unsteady    Co-evaluation               End of Session Equipment Utilized During Treatment: Gait belt Activity Tolerance: Patient limited by pain;Patient limited by fatigue Patient left: in bed;with call bell/phone within reach Nurse Communication: Mobility status (case manager consult wiht PT and told nursing about plan)         Time: [id number] PT Time Calculation (min) (ACUTE ONLY): 24 min   Charges:   PT Evaluation $PT Eval Moderate Complexity: 1 Procedure PT Treatments $Gait Training: 8-22 mins   PT G CodesRamond Dial 01/22/16, 3:38 PM   Mee Hives, PT MS Acute Rehab Dept. Number: [facility] [phone] and [place] [phone]

## 2016-01-15 NOTE — Progress Notes (Signed)
Nephrologist at bedside.  Verbal order received to reduce treatment time to 3.5 hours.  Adjustments made to treatment per this RN.  Will continue to monitor.

## 2016-01-15 NOTE — Progress Notes (Signed)
Dialysis treatment completed.  1500 mL ultrafiltrated.  1000 mL net fluid removal.  Patient status unchanged. Lung sounds clear to ausculation in all fields. No edema. Cardiac: Regular R&R.  Cleansed RIJ catheter with chlorhexidine.  Disconnected lines and flushed ports with saline per protocol.  Ports locked with heparin and capped per protocol.    Report given to bedside, RN Ulice Dash.

## 2016-01-15 NOTE — Evaluation (Addendum)
Occupational Therapy Re- Evaluation Patient Details Name: Tony Freeman MRN: [id number] DOB: 10/11/1951 Today's Date: 01/15/2016    History of Present Illness Pt is a 65 y/o M who presented w/ SOB and a penial ulcer.  Pt's PMH includes ESRD, DM, CAD, s/p stent placement, AICD, CHF, dementia, PVD, stroke.HD  T, TH, SAT   Clinical Impression   Pt was previously evaluated on 3/19 and found to be at baseline.  New order received and noted PT's evaluation showed change in status.  Re-evaluation completed.  Pt currently needs min to mod A for adls.  He was independent except for bathing.  Goals in acute are for supervision to min A.      Follow Up Recommendations  SNF;Supervision/Assistance - 24 hour (close supervision)    Equipment Recommendations  None recommended by OT    Recommendations for Other Services       Precautions / Restrictions Precautions Precautions: Fall Restrictions Weight Bearing Restrictions: No      Mobility Bed Mobility Overal bed mobility: Needs Assistance Bed Mobility: Sit to Supine     Supine to sit: Min assist Sit to supine: Min guard   General bed mobility comments: assist for trunk  Transfers Overall transfer level: Needs assistance Equipment used: Rolling walker (2 wheeled);1 person hand held assist Transfers: Sit to/from Stand Sit to Stand: Min assist        General transfer comment: steadying assistance    Balance Overall balance assessment: Needs assistance Sitting-balance support: Feet supported Sitting balance-Leahy Scale: Good     Standing balance support: Bilateral upper extremity supported Standing balance-Leahy Scale: Poor                 High Level Balance Comments: unsteady; swaying            ADL Overall ADL's : Needs assistance/impaired     Grooming: Minimal assistance;Wash/dry hands;Wash/dry face;Oral care;Bed level   Upper Body Bathing: Minimal assitance;Sitting   Lower Body Bathing:  Minimal assistance;Sit to/from stand   Upper Body Dressing : Minimal assistance;Sitting (for lines)   Lower Body Dressing: Moderate assistance;Sit to/from stand                 General ADL Comments: Re-eval was done today due to change in status based upon PT's note (and new order also received)  performed ADL.  Min A for balance when standing due to postural instability:  swaying and small losses of balance posteriorly and to R.  Used walker.  Guarded port during bathing.  Pt able to cross RLE but not L for ADLs due to penis discomfort     Vision     Perception     Praxis      Pertinent Vitals/Pain Pain Assessment: 0-10 Pain Score: 6  Pain Location: feet/penis during adls Pain Descriptors / Indicators: Aching (tingling feet) Pain Intervention(s): Monitored during session;Other (comment) (added RW to gait, notified nursing)     Hand Dominance Right   Extremity/Trunk Assessment Upper Extremity Assessment Upper Extremity Assessment: Overall WFL for tasks assessed      Cervical / Trunk Assessment Cervical / Trunk Assessment: Normal   Communication Communication Communication: No difficulties   Cognition Arousal/Alertness: Awake/alert Behavior During Therapy: WFL for tasks assessed/performed Overall Cognitive Status: History of cognitive impairments - at baseline       Memory: Decreased short-term memory             General Comments       Exercises  Shoulder Instructions      Home Living Family/patient expects to be discharged to:: Private residence Living Arrangements: Children;Other relatives Available Help at Discharge: Family;Available 24 hours/day Type of Home: Apartment Home Access: Level entry     Home Layout: One level     Bathroom Shower/Tub: Tub/shower unit Shower/tub characteristics: Curtain Biochemist, clinical: Standard Bathroom Accessibility: No   Home Equipment: Environmental consultant - 2 wheels;Shower seat;Grab bars - tub/shower;Hand held  shower head   Additional Comments: has been sponge bathing due to port      Prior Functioning/Environment Level of Independence: Independent  Gait / Transfers Assistance Needed: does not use AD.  1 near fall over the past 6 months  ADL's / Homemaking Assistance Needed: Needs assist for sponge bath        OT Diagnosis: Generalized weakness   OT Problem List: Decreased strength;Decreased activity tolerance;Impaired balance (sitting and/or standing);Decreased cognition;Decreased safety awareness;Pain   OT Treatment/Interventions: Self-care/ADL training;DME and/or AE instruction;Balance training;Patient/family education;Therapeutic activities;Cognitive remediation/compensation    OT Goals(Current goals can be found in the care plan section) Acute Rehab OT Goals Patient Stated Goal: go home OT Goal Formulation: With patient Time For Goal Achievement: 01/22/16 Potential to Achieve Goals: Good ADL Goals Pt Will Perform Grooming: with supervision;standing Pt Will Perform Lower Body Dressing: with min assist;sit to/from stand Pt Will Transfer to Toilet: with supervision;ambulating;bedside commode  OT Frequency: Min 2X/week   Barriers to D/C:            Co-evaluation              End of Session    Activity Tolerance: Patient tolerated treatment well Patient left: in bed;with call bell/phone within reach;with bed alarm set   Time: [id number] OT Time Calculation (min): 32 min Charges:  OT General Charges $OT Visit: 1 Procedure OT Evaluation $OT Re-eval: 1 Procedure OT Treatments $Self Care/Home Management : 8-22 mins G-Codes:    Tony Freeman 2016-01-28, 5:23 PM Lesle Chris, OTR/L [phone] 01-28-2016

## 2016-01-15 NOTE — Progress Notes (Signed)
[facility] PROGRESS NOTE  Tony Freeman Tamm [id number] DOB: 09-04-1951 DOA: 01/10/2016 PCP: Tanya Nones, MD  HPI/Brief narrative 65 year old African-American male with a past medical history of end-stage renal disease on hemodialysis, hypertension, hyperlipidemia, diabetes, coronary artery disease, chronic systolic and diastolic CHF, AICD placement, paroxysmal atrial fibrillation on Coumadin, presented with shortness of breath and ulcer on his penis. He was hospitalized for management of possible healthcare associated pneumonia and penile ulcer.  Assessment/Plan: Healthcare associated pneumonia with right-sided pleural effusion Patient was continued on vancomycin and cefepime. Blood cultures are neg so far. Urine for strep pneumonia antigen is negative. Influenza PCR is negative. Symptomatically improving. Will need follow-up chest x-ray in a few weeks' time to ensure resolution of the effusion. Now on PO ceftin as of 3/20.  Penile ulcer Etiology is unclear. HIV is nonreactive. RPR is nonreactive. Continue empiric acyclovir treatment. GC/chlamydia probe is pending. 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 Patient is reporting llesion seems to be somewhat improved. Discussed with Nephrology, no indication for biopsy at this time given small size of lesion. Plan to continue to monitor and if enlarges, then would biopsy at that time.  History of coronary artery disease with mildly elevated troponin Stable. Patient denies any chest pain. Mildly elevated troponin, likely due to renal failure. No need for further workup at this time.  DM-II Continue SSI. Monitor CBGs.  Essential Hypertension Continue Coreg, Cozaar  Chronic combined systolic and diastolic CHF (congestive heart failure)  2-D echo on 11/29/15 showed EF 15-20 percent. Patient has AICD placement. No leg edema. Volume is managed by renal via dialysis. CHF is compensated. Continue antiplatelet agent.  Atrial  Fibrillation CHA2DS2-VASc Score is 7, needs oral anticoagulation. Patient is on Coumadin at home. INR is therapeutic. Continue Coreg and amiodarone.  Continue Coumadin per pharmacy  HLD Continue home medications: Lipitor  ESRD-HD (TTS) Nephrology is following. 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 Pt has been complaining of B foot pain and tingling Started trial of Neurontin on 3/20 Seen by PT with recs for SNF. Will request OT consult as well  Code Status: Full Family Communication: Pt in room Disposition Plan: Possible d/c within 24hrs   Consultants:  Nephrology  Procedures:    Antibiotics: Anti-infectives    Start     Dose/Rate Route Frequency Ordered Stop   01/15/16 0000  cefUROXime (CEFTIN) 500 MG tablet     500 mg Oral Every 24 hours 01/15/16 1113     01/14/16 2000  acyclovir (ZOVIRAX) 200 MG capsule 200 mg     200 mg Oral 2 times daily 01/14/16 1131     01/13/16 1800  cefUROXime (CEFTIN) tablet 500 mg     500 mg Oral Every 24 hours 01/13/16 1528     01/13/16 1700  cefUROXime (CEFTIN) tablet 250 mg  Status:  Discontinued     250 mg Oral 2 times daily with meals 01/13/16 1521 01/13/16 1528   01/12/16 1800  ceFEPIme (MAXIPIME) 2 g in dextrose 5 % 50 mL IVPB  Status:  Discontinued     2 g 100 mL/hr over 30 Minutes Intravenous Every T-Th-Sa (1800) 01/10/16 2325 01/13/16 1521   01/12/16 1200  vancomycin (VANCOCIN) IVPB 1000 mg/200 mL premix  Status:  Discontinued     1,000 mg 200 mL/hr over 60 Minutes Intravenous Every T-Th-Sa (Hemodialysis) 01/10/16 2325 01/13/16 1521   01/11/16 2200  acyclovir (ZOVIRAX) 200 mg in dextrose 5 % 100 mL IVPB  Status:  Discontinued     200 mg 104 mL/hr over 60 Minutes Intravenous Every 24 hours 01/10/16 2330 01/14/16 1130   01/10/16 2345  acyclovir (ZOVIRAX) 200 mg in dextrose 5 % 100 mL IVPB     200 mg 104 mL/hr over 60 Minutes Intravenous  Once 01/10/16 2330 01/11/16 0202   01/10/16 2145   ceFEPIme (MAXIPIME) 2 g in dextrose 5 % 50 mL IVPB     2 g 100 mL/hr over 30 Minutes Intravenous  Once 01/10/16 2132 01/11/16 0256   01/10/16 2130  vancomycin (VANCOCIN) 1,750 mg in sodium chloride 0.9 % 500 mL IVPB     1,750 mg 250 mL/hr over 120 Minutes Intravenous  Once 01/10/16 2128 01/11/16 0004   01/10/16 2100  vancomycin (VANCOCIN) IVPB 1000 mg/200 mL premix  Status:  Discontinued     1,000 mg 200 mL/hr over 60 Minutes Intravenous  Once 01/10/16 2043 01/10/16 2128   01/10/16 2045  ceFEPIme (MAXIPIME) 1 g in dextrose 5 % 50 mL IVPB  Status:  Discontinued     1 g 100 mL/hr over 30 Minutes Intravenous  Once 01/10/16 2034 01/10/16 2132      HPI/Subjective: Still complaining of burning sensation to B feet, R>L  Objective: Filed Vitals:   01/15/16 1010 01/15/16 1025 01/15/16 1035 01/15/16 1040  BP: 95/64 100/65 90/62 110/77  Pulse: 69 70 72 68  Temp:   97 F (36.1 C)   TempSrc:      Resp:   17   Height:      Weight:   81.1 kg (178 lb 12.7 oz)   SpO2:        Intake/Output Summary (Last 24 hours) at 01/15/16 1523 Last data filed at 01/15/16 1300  Gross per 24 hour  Intake    720 ml  Output   1000 ml  Net   -280 ml   Filed Weights   01/12/16 2150 01/15/16 0700 01/15/16 1035  Weight: 79 kg (174 lb 2.6 oz) 82.1 kg (181 lb) 81.1 kg (178 lb 12.7 oz)    Exam:   General:  Awake, in nad  Cardiovascular: regular, s1, s2  Respiratory: normal resp effort, no wheezing  Abdomen: soft, nondistended, pos BS  Musculoskeletal: perfused, no clubbing  Data Reviewed: Basic Metabolic Panel:  Recent Labs Lab 01/10/16 2005 01/11/16 1850 01/12/16 1917 01/15/16 0449  NA 137 135 135 131*  K 4.1 4.4 4.2 5.5*  CL 93* 94* 96* 94*  CO2 30 23 27 24   GLUCOSE 138* 78 121* 107*  BUN 14 29* 42* 56*  CREATININE 3.71* 5.23* 6.46* 8.56*  CALCIUM 9.6 9.6 9.5 9.5  PHOS  --  5.4* 5.3*  --    Liver Function Tests:  Recent Labs Lab 01/11/16 1850 01/12/16 1917  ALBUMIN 3.4* 3.1*    No results for input(s): LIPASE, AMYLASE in the last 168 hours. No results for input(s): AMMONIA in the last 168 hours. CBC:  Recent Labs Lab 01/10/16 2005 01/11/16 1850 01/12/16 1917 01/14/16 0557 01/15/16 0726  WBC 5.3 4.9 6.5 8.7 5.7  NEUTROABS 3.5  --   --   --   --   HGB 10.6* 10.1* 9.1* 9.9* 9.9*  HCT 32.7* 30.5* 29.0* 31.1* 29.2*  MCV 98.5 96.8 98.0 100.6* 98.3  PLT 153 93* 119* 108* 105*   Cardiac Enzymes:  Recent Labs Lab 01/11/16 0010 01/11/16 0716 01/11/16 0838  TROPONINI 0.09* 0.10* 0.10*   BNP (last 3 results)  Recent Labs  01/10/16 2006  BNP 4360.3*    ProBNP (last 3 results) No results for input(s): PROBNP in the last 8760 hours.  CBG:  Recent Labs Lab 01/14/16 0753 01/14/16 1132 01/14/16 1650 01/14/16 2045 01/15/16 1204  GLUCAP 104* 172* 130* 139* 88    Recent Results (from the past 240 hour(s))  Culture, blood (routine x 2) Call MD if unable to obtain prior to antibiotics being given     Status: None (Preliminary result)   Collection Time: 01/11/16 12:05 AM  Result Value Ref Range Status   Specimen Description BLOOD RIGHT FOREARM  Final   Special Requests BOTTLES DRAWN AEROBIC ONLY 7ML  Final   Culture NO GROWTH 3 DAYS  Final   Report Status PENDING  Incomplete  Culture, blood (routine x 2) Call MD if unable to obtain prior to antibiotics being given     Status: None (Preliminary result)   Collection Time: 01/11/16 12:10 AM  Result Value Ref Range Status   Specimen Description BLOOD RIGHT HAND  Final   Special Requests BOTTLES DRAWN AEROBIC AND ANAEROBIC 5ML  Final   Culture NO GROWTH 3 DAYS  Final   Report Status PENDING  Incomplete  Wound culture     Status: None   Collection Time: 01/11/16 12:40 AM  Result Value Ref Range Status   Specimen Description ULCER  Final   Special Requests PENIS  Final   Gram Stain   Final    NO WBC SEEN NO SQUAMOUS EPITHELIAL CELLS SEEN NO ORGANISMS SEEN Performed at [facility]     Culture   Final    NORMAL SKIN FLORA Performed at [facility]    Report Status 01/14/2016 FINAL  Final  MRSA PCR Screening     Status: None   Collection Time: 01/11/16 12:40 AM  Result Value Ref Range Status   MRSA by PCR NEGATIVE NEGATIVE Final    Comment:        The GeneXpert MRSA Assay (FDA approved for NASAL specimens only), is one component of a comprehensive MRSA colonization surveillance program. It is not intended to diagnose MRSA infection nor to guide or monitor treatment for MRSA infections.      Studies: No results found.  Scheduled Meds: . acyclovir  200 mg Oral BID  . amiodarone  100 mg Oral Daily  . atorvastatin  40 mg Oral Daily  . carvedilol  6.25 mg Oral BID WC  . cefUROXime  500 mg Oral Q24H  . clopidogrel  75 mg Oral Daily  . dextromethorphan-guaiFENesin  1 tablet Oral BID  . feeding supplement (NEPRO CARB STEADY)  237 mL Oral BID BM  . [START ON 01/17/2016] ferric gluconate (FERRLECIT/NULECIT) IV  62.5 mg Intravenous Q Thu-HD  . gabapentin  300 mg Oral QHS  . insulin aspart  0-5 Units Subcutaneous QHS  . insulin aspart  0-9 Units Subcutaneous TID WC  . lanthanum  1,000 mg Oral TID WC  . mometasone-formoterol  2 puff Inhalation BID  . multivitamin with minerals  1 tablet Oral Daily  . Warfarin - Pharmacist Dosing Inpatient   Does not apply q1800   Continuous Infusions:   Principal Problem:   HCAP (healthcare-associated pneumonia) Active Problems:   Elevated troponin   DM (diabetes mellitus), type 2, uncontrolled, with renal complications (HCC)   Essential hypertension, benign   Chronic combined systolic and diastolic CHF (congestive heart failure) (HCC)   CAD (coronary artery disease)   PAF (paroxysmal atrial fibrillation) (HCC)   Atrial fibrillation ([place])  HLD (hyperlipidemia)   ESRD (end stage renal disease) on dialysis ([place])   Ulcer of penis   Calcification of soft tissue    CHIU, STEPHEN K  [facility] Pager  [phone]. If 7PM-7AM, please contact night-coverage at [email or web address], password [email or web address] 01/15/2016, 3:23 PM  LOS: 5 days

## 2016-01-15 NOTE — Progress Notes (Signed)
ANTICOAGULATION & ANTIBIOTIC CONSULT NOTE - Follow Up Consult  Pharmacy Consult for Warfarin Indication: atrial fibrillation   No Known Allergies  Patient Measurements: Height: 5\' 6"  (167.6 cm) Weight: 178 lb 12.7 oz (81.1 kg) IBW/kg (Calculated) : 63.8  Vital Signs: Temp: 97 F (36.1 C) (03/21 1035) Temp Source: Oral (03/21 0700) BP: 110/77 mmHg (03/21 1040) Pulse Rate: 68 (03/21 1040)  Labs:  Recent Labs  01/12/16 1917 01/13/16 0557 01/14/16 0557 01/15/16 0449 01/15/16 0726  HGB 9.1*  --  9.9*  --  9.9*  HCT 29.0*  --  31.1*  --  29.2*  PLT 119*  --  108*  --  105*  LABPROT  --  25.7* 30.6* 32.8*  --   INR  --  2.37* 3.00* 3.29*  --   CREATININE 6.46*  --   --  8.56*  --     Estimated Creatinine Clearance: 8.8 mL/min (by C-G formula based on Cr of 8.56).   Assessment: 87 YOM who presented on 3/17 with SOB and penile ulcers. PTA the patient was on warfarin for hx Afib/CVA - pharmacy consulted to resume this admit. Per the coumadin clinic notes, the patient's INR goal of 2-2.5 on a PTA dose of 2.5 mg daily EXCEPT for 5 mg on MWF.   INR today is SUPRAtherapeutic of lower INR goal specified by the patient's coumadin clinic (INR 3.29 << 3, goal of 2-2.5). The patient was noted to have started on Ceftin po on 3/19 which may increase warfarin sensitivity. Hgb/Hct stable, plts 105 << 108- no overt s/sx of bleeding noted.    Goal of Therapy:  INR 2-2.5   Plan:  1. Hold warfarin dose today 2. Will continue to monitor for any signs/symptoms of bleeding and will follow up with PT/INR in the a.m.   Alycia Rossetti, PharmD, BCPS Clinical Pharmacist Pager: [phone] 01/15/2016 1:29 PM

## 2016-01-15 NOTE — Progress Notes (Signed)
[facility] Progress Note  Assessment/Plan: 1. HCAP with R. Pleural Effusion: Per primary. Initially on Vanc/Zosyn. Changed to Ceftin.  2. ESRD -TTS. HD today 3. Anemia - Hgb 9.9 OP ESA dose given 03/14. Follow HGB 4. 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  For now will use preliminary measures w low Ca bath and no Ca-based binders or vit D.  If lesion gets bigger will refer for tissue biopsy.  Lesion not big enough for biopsy at this time. Last PTH 95.   5. HTN/volume - BP soft. Reduced coreg to 6.25 mg PO BID, dc losartan. 6. Nutrition - albumin 3.1 7. Penile lesion: necrotic skin lesion is not likely HSV, poss calciphylaxis.   8. Afib: DM per primary. CHA2DS2-VASc Score 7. Coumadin per pharmacy INR 3.0 9. DM: per primary 10. 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 Has ICD. Dual chamber pacemaker 11. Dispo - needs to get OOB, will ask PT to see. Hopefully dc soon.   Kelly Splinter MD [facility] pager [phone]    cell [phone] 01/15/2016, 9:50 AM      Subjective: Looks much better, alert today.  No complaitns. Burning in penis as preadmission.    Objective Filed Vitals:   01/15/16 0810 01/15/16 0840 01/15/16 0910 01/15/16 0925  BP: 87/57 91/72 90/48  89/53  Pulse: 81 64 74 73  Temp:      TempSrc:      Resp: 14     Height:      Weight:      SpO2:       Physical Exam General: Well nourished, NAD Heart: S1, S2 II/VI systolic M AV pacing on monitor.  Mild JVD Lungs: Bilateral breath sounds with scattered coarse breath sounds, no wheezing Abdomen: soft nontender Extremities: No LE edema Dialysis Access: R perm cath Drsg CDI nonmaturing LUA AVF Dialysis Orders: [facility]   TTS 4.25 hr 400/800 EDW 79.5 2 K 2.25 Ca profile 4, heparin 2600 units venofer 50mg   3/16 Mircera 75 mcg q 2 weeks   Additional Objective Labs: Basic Metabolic Panel:  Recent Labs Lab 01/11/16 1850 01/12/16 1917 01/15/16 0449  NA 135 135 131*  K 4.4 4.2 5.5*  CL 94*  96* 94*  CO2 23 27 24   GLUCOSE 78 121* 107*  BUN 29* 42* 56*  CREATININE 5.23* 6.46* 8.56*  CALCIUM 9.6 9.5 9.5  PHOS 5.4* 5.3*  --    Liver Function Tests:  Recent Labs Lab 01/11/16 1850 01/12/16 1917  ALBUMIN 3.4* 3.1*   No results for input(s): LIPASE, AMYLASE in the last 168 hours. CBC:  Recent Labs Lab 01/10/16 2005 01/11/16 1850 01/12/16 1917 01/14/16 0557 01/15/16 0726  WBC 5.3 4.9 6.5 8.7 5.7  NEUTROABS 3.5  --   --   --   --   HGB 10.6* 10.1* 9.1* 9.9* 9.9*  HCT 32.7* 30.5* 29.0* 31.1* 29.2*  MCV 98.5 96.8 98.0 100.6* 98.3  PLT 153 93* 119* 108* 105*   Blood Culture    Component Value Date/Time   SDES ULCER 01/11/2016 0040   SPECREQUEST PENIS 01/11/2016 0040   CULT  01/11/2016 0040    NORMAL SKIN FLORA Performed at [facility] 01/14/2016 FINAL 01/11/2016 0040    Cardiac Enzymes:  Recent Labs Lab 01/11/16 0010 01/11/16 0716 01/11/16 0838  TROPONINI 0.09* 0.10* 0.10*   CBG:  Recent Labs Lab 01/13/16 2046 01/14/16 0753 01/14/16 1132 01/14/16 1650 01/14/16 2045  GLUCAP 119* 104* 172* 130* 139*  Iron Studies: No results for input(s): IRON, TIBC, TRANSFERRIN, FERRITIN in the last 72 hours. @lablastinr3 @ Studies/Results: No results found. Medications:   . acyclovir  200 mg Oral BID  . amiodarone  100 mg Oral Daily  . atorvastatin  40 mg Oral Daily  . carvedilol  6.25 mg Oral BID WC  . cefUROXime  500 mg Oral Q24H  . clopidogrel  75 mg Oral Daily  . dextromethorphan-guaiFENesin  1 tablet Oral BID  . feeding supplement (NEPRO CARB STEADY)  237 mL Oral BID BM  . [START ON 01/17/2016] ferric gluconate (FERRLECIT/NULECIT) IV  62.5 mg Intravenous Q Thu-HD  . gabapentin  300 mg Oral QHS  . insulin aspart  0-5 Units Subcutaneous QHS  . insulin aspart  0-9 Units Subcutaneous TID WC  . lanthanum  1,000 mg Oral TID WC  . mometasone-formoterol  2 puff Inhalation BID  . multivitamin with minerals  1 tablet Oral Daily  .  Warfarin - Pharmacist Dosing Inpatient   Does not apply [phone]

## 2016-01-16 ENCOUNTER — Encounter (HOSPITAL_COMMUNITY): Payer: Medicare HMO

## 2016-01-16 ENCOUNTER — Inpatient Hospital Stay (HOSPITAL_COMMUNITY): Payer: Medicare HMO

## 2016-01-16 DIAGNOSIS — I5042 Chronic combined systolic (congestive) and diastolic (congestive) heart failure: Secondary | ICD-10-CM

## 2016-01-16 DIAGNOSIS — M7989 Other specified soft tissue disorders: Secondary | ICD-10-CM

## 2016-01-16 DIAGNOSIS — J189 Pneumonia, unspecified organism: Secondary | ICD-10-CM

## 2016-01-16 LAB — CULTURE, BLOOD (ROUTINE X 2)
CULTURE: NO GROWTH
CULTURE: NO GROWTH

## 2016-01-16 LAB — RESPIRATORY VIRUS PANEL
Adenovirus: NEGATIVE
INFLUENZA A: NEGATIVE
Influenza B: POSITIVE — AB
METAPNEUMOVIRUS: NEGATIVE
PARAINFLUENZA 2 A: NEGATIVE
PARAINFLUENZA 3 A: NEGATIVE
Parainfluenza 1: NEGATIVE
Respiratory Syncytial Virus A: NEGATIVE
Respiratory Syncytial Virus B: NEGATIVE
Rhinovirus: NEGATIVE

## 2016-01-16 LAB — CBC
HEMATOCRIT: 30 % — AB (ref 39.0–52.0)
Hemoglobin: 9.3 g/dL — ABNORMAL LOW (ref 13.0–17.0)
MCH: 30.8 pg (ref 26.0–34.0)
MCHC: 31 g/dL (ref 30.0–36.0)
MCV: 99.3 fL (ref 78.0–100.0)
Platelets: 110 10*3/uL — ABNORMAL LOW (ref 150–400)
RBC: 3.02 MIL/uL — ABNORMAL LOW (ref 4.22–5.81)
RDW: 16 % — AB (ref 11.5–15.5)
WBC: 5.6 10*3/uL (ref 4.0–10.5)

## 2016-01-16 LAB — PROTIME-INR
INR: 2.51 — AB (ref 0.00–1.49)
PROTHROMBIN TIME: 26.8 s — AB (ref 11.6–15.2)

## 2016-01-16 LAB — BASIC METABOLIC PANEL
ANION GAP: 10 (ref 5–15)
BUN: 38 mg/dL — ABNORMAL HIGH (ref 6–20)
CHLORIDE: 96 mmol/L — AB (ref 101–111)
CO2: 27 mmol/L (ref 22–32)
Calcium: 9.3 mg/dL (ref 8.9–10.3)
Creatinine, Ser: 6.49 mg/dL — ABNORMAL HIGH (ref 0.61–1.24)
GFR calc non Af Amer: 8 mL/min — ABNORMAL LOW (ref >60)
GFR, EST AFRICAN AMERICAN: 9 mL/min — AB (ref >60)
Glucose, Bld: 147 mg/dL — ABNORMAL HIGH (ref 65–99)
Potassium: 4.8 mmol/L (ref 3.5–5.1)
Sodium: 133 mmol/L — ABNORMAL LOW (ref 135–145)

## 2016-01-16 LAB — GLUCOSE, CAPILLARY
GLUCOSE-CAPILLARY: 135 mg/dL — AB (ref 65–99)
Glucose-Capillary: 88 mg/dL (ref 65–99)
Glucose-Capillary: 113 mg/dL — ABNORMAL HIGH (ref 65–99)

## 2016-01-16 MED ORDER — SODIUM CHLORIDE 0.9 % IV SOLN
25.0000 g | INTRAVENOUS | Status: DC
  Administered 2016-01-17: 25 g via INTRAVENOUS
  Filled 2016-01-16 (×3): qty 100

## 2016-01-16 MED ORDER — OSELTAMIVIR PHOSPHATE 30 MG PO CAPS
30.0000 mg | ORAL_CAPSULE | ORAL | Status: AC
  Administered 2016-01-17 – 2016-01-19 (×2): 30 mg via ORAL
  Filled 2016-01-16 (×2): qty 1

## 2016-01-16 MED ORDER — SODIUM CHLORIDE 0.9 % IV BOLUS (SEPSIS)
1000.0000 mL | Freq: Once | INTRAVENOUS | Status: AC
  Administered 2016-01-16: 1000 mL via INTRAVENOUS

## 2016-01-16 MED ORDER — WARFARIN SODIUM 2.5 MG PO TABS
2.5000 mg | ORAL_TABLET | Freq: Once | ORAL | Status: AC
  Administered 2016-01-16: 2.5 mg via ORAL
  Filled 2016-01-16: qty 1

## 2016-01-16 MED ORDER — OSELTAMIVIR PHOSPHATE 30 MG PO CAPS
30.0000 mg | ORAL_CAPSULE | Freq: Every day | ORAL | Status: DC
  Administered 2016-01-16: 30 mg via ORAL
  Filled 2016-01-16: qty 1

## 2016-01-16 MED ORDER — HYDROCODONE-ACETAMINOPHEN 5-325 MG PO TABS: 1.0000 | ORAL_TABLET | ORAL | Status: DC | PRN

## 2016-01-16 MED ORDER — ONDANSETRON HCL 4 MG/2ML IJ SOLN
4.0000 mg | Freq: Four times a day (QID) | INTRAMUSCULAR | Status: DC | PRN
  Administered 2016-01-16 – 2016-01-17 (×2): 4 mg via INTRAVENOUS
  Filled 2016-01-16 (×2): qty 2

## 2016-01-16 NOTE — Progress Notes (Signed)
Physical Therapy Treatment Patient Details Name: [facility] MRN: [id number] DOB: 1951-04-20 Today's Date: 01/16/2016    History of Present Illness Pt is a 66 y/o M who presented w/ SOB and a penial ulcer.  Pt's PMH includes ESRD, DM, CAD, s/p stent placement, AICD, CHF, dementia, PVD, stroke.HD  T, TH, SAT    PT Comments    Progressing towards functional goals. Balance improves greatly with use of a rolling walker for support. Delayed processing, denies dizziness with sitting, standing, and ambulating. Feels he will be able to manage at home with son's supervision. Pt will benefit from [place] home follow-up. Will continue to follow and progress until d/c.  Follow Up Recommendations  Home health PT;Supervision for mobility/OOB (States son works from home and can care for pt.)     Clinical biochemist with 5" wheels    Recommendations for Other Services       Precautions / Restrictions Precautions Precautions: Fall Restrictions Weight Bearing Restrictions: No    Mobility  Bed Mobility Overal bed mobility: Needs Assistance Bed Mobility: Sit to Supine;Supine to Sit     Supine to sit: Supervision Sit to supine: Supervision   General bed mobility comments: VC to initiate desired movement in/out of bed. Did not require physical assist.  Transfers Overall transfer level: Needs assistance Equipment used: Rolling walker (2 wheeled) Transfers: Sit to/from Stand Sit to Stand: Min guard         General transfer comment: Min guard for safety with minor sway, improves with RW for support. VC for hand placement.  Ambulation/Gait Ambulation/Gait assistance: Supervision Ambulation Distance (Feet): 95 Feet Assistive device: Rolling walker (2 wheeled) Gait Pattern/deviations: Step-through pattern;Decreased stride length;Drifts right/left Gait velocity: reduced Gait velocity interpretation: Below normal speed for age/gender General Gait Details: Cues  for forward gaze. Short distance without RW, pt reaches for furniture and demonstrates increased sway. Greatly improves with RW for support. Some scissoring with backwards stepping but able to self correct balance loss. Lightly using RW for support throughout, no buckling or overt loss of balance noted requiring physical assist.   Stairs            Wheelchair Mobility    Modified Rankin (Stroke Patients Only)       Balance                                    Cognition Arousal/Alertness: Awake/alert Behavior During Therapy: WFL for tasks assessed/performed Overall Cognitive Status: Impaired/Different from baseline Area of Impairment: Problem solving     Memory:  (does not remember he owns a RW)       Problem Solving: Slow processing;Requires verbal cues      Exercises      General Comments General comments (skin integrity, edema, etc.): SpO2 with poor waveform on pulse ox, slow to register - 82% after ambulating on room air, up to 90s with 2L supplemental O2 with rest.      Pertinent Vitals/Pain Pain Assessment: Faces Faces Pain Scale: Hurts even more Pain Location: Penis Pain Descriptors / Indicators: Aching Pain Intervention(s): Monitored during session    Home Living                      Prior Function            PT Goals (current goals can now be found in the care plan section) Acute Rehab PT  Goals Patient Stated Goal: go home PT Goal Formulation: With patient Time For Goal Achievement: 01/29/16 Potential to Achieve Goals: Good Progress towards PT goals: Progressing toward goals    Frequency  Min 3X/week    PT Plan Discharge plan needs to be updated    Co-evaluation             End of Session Equipment Utilized During Treatment: Gait belt Activity Tolerance: Patient limited by fatigue Patient left: in bed;with call bell/phone within reach;with bed alarm set     Time: 1204-1229 PT Time Calculation (min) (ACUTE  ONLY): 25 min  Charges:  $Gait Training: 8-22 mins $Therapeutic Activity: 8-22 mins                    G Codes:      Ellouise Newer 01/26/2016, 12:48 PM Camille Bal [place], [place]

## 2016-01-16 NOTE — Progress Notes (Signed)
[facility] Progress Note  Assessment: 1. Hypotension/ lightheadedness - orthostatic BP sitting is 68/P today. Vol depleted most likely. Will give NS bolus , hold coreg and recheck in a couple hours.  2. HCAP with R. Pleural Effusion: Per primary. Initially on Vanc/Zosyn. Changed to Ceftin. 3 ESRD -TTS. HD tomorrow 4. 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  For now will use preliminary measures w low Ca bath and no Ca-based binders or vit D.  If lesion gets bigger will refer for tissue biopsy.  Lesion not big enough for biopsy at this time. Last PTH 95.   5. HTN/volume - BP's low, meds on hold. Wt's prob not accurate 6. Nutrition - albumin 3.1 7. Penile lesion/ painful/ necrotic / small: necrotic skin lesion is not likely HSV, poss early calciphylaxis.   8. Afib: DM per primary. CHA2DS2-VASc Score 7. Coumadin per pharmacy INR 3.0 9. DM: per primary 10. 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 Has ICD. Dual chamber pacemaker 11. Anemia - Hgb 9.9 OP ESA dose given 03/14. Follow HGB  Plan - NS IV bolus 1 liter, recheck VS, hold all BP meds. HD tomorrow no UF.   Kelly Splinter MD [facility] pager [phone]    cell [phone] 01/16/2016, 11:16 AM      Subjective: lightheaded on standing, no other complaints.   Objective Filed Vitals:   01/15/16 2100 01/16/16 0555 01/16/16 0821 01/16/16 1000  BP: 88/51 108/66  108/66  Pulse: 81 66  67  Temp: 98.4 F (36.9 C) 98.1 F (36.7 C)  98.4 F (36.9 C)  TempSrc: Oral Oral  Oral  Resp: 18 19  18   Height:      Weight:      SpO2: 100% 99% 94% 96%   Physical Exam General: Well nourished, NAD Heart: S1, S2 II/VI systolic M AV pacing on monitor.  Mild JVD Lungs: Bilateral breath sounds with scattered coarse breath sounds, no wheezing Abdomen: soft nontender Extremities: No LE edema Dialysis Access: R perm cath Drsg CDI nonmaturing LUA AVF Dialysis Orders: [facility]   TTS 4.25 hr 400/800 EDW 79.5 2 K 2.25 Ca  profile 4, heparin 2600 units venofer 50mg   3/16 Mircera 75 mcg q 2 weeks   Additional Objective Labs: Basic Metabolic Panel:  Recent Labs Lab 01/11/16 1850 01/12/16 1917 01/15/16 0449  NA 135 135 131*  K 4.4 4.2 5.5*  CL 94* 96* 94*  CO2 23 27 24   GLUCOSE 78 121* 107*  BUN 29* 42* 56*  CREATININE 5.23* 6.46* 8.56*  CALCIUM 9.6 9.5 9.5  PHOS 5.4* 5.3*  --    Liver Function Tests:  Recent Labs Lab 01/11/16 1850 01/12/16 1917  ALBUMIN 3.4* 3.1*   No results for input(s): LIPASE, AMYLASE in the last 168 hours. CBC:  Recent Labs Lab 01/10/16 2005 01/11/16 1850 01/12/16 1917 01/14/16 0557 01/15/16 0726  WBC 5.3 4.9 6.5 8.7 5.7  NEUTROABS 3.5  --   --   --   --   HGB 10.6* 10.1* 9.1* 9.9* 9.9*  HCT 32.7* 30.5* 29.0* 31.1* 29.2*  MCV 98.5 96.8 98.0 100.6* 98.3  PLT 153 93* 119* 108* 105*   Blood Culture    Component Value Date/Time   SDES ULCER 01/11/2016 0040   SPECREQUEST PENIS 01/11/2016 0040   CULT  01/11/2016 0040    NORMAL SKIN FLORA Performed at [facility] 01/14/2016 FINAL 01/11/2016 0040    Cardiac Enzymes:  Recent Labs Lab 01/11/16 0010 01/11/16  0716 01/11/16 0838  TROPONINI 0.09* 0.10* 0.10*   CBG:  Recent Labs Lab 01/14/16 2045 01/15/16 1204 01/15/16 1606 01/15/16 2102 01/16/16 0818  GLUCAP 139* 88 156* 145* 88   Iron Studies: No results for input(s): IRON, TIBC, TRANSFERRIN, FERRITIN in the last 72 hours. @lablastinr3 @ Studies/Results: No results found. Medications:   . acyclovir  200 mg Oral BID  . amiodarone  100 mg Oral Daily  . atorvastatin  40 mg Oral Daily  . carvedilol  6.25 mg Oral BID WC  . cefUROXime  500 mg Oral Q24H  . clopidogrel  75 mg Oral Daily  . dextromethorphan-guaiFENesin  1 tablet Oral BID  . feeding supplement (NEPRO CARB STEADY)  237 mL Oral BID BM  . [START ON 01/17/2016] ferric gluconate (FERRLECIT/NULECIT) IV  62.5 mg Intravenous Q Thu-HD  . gabapentin  300 mg Oral QHS   . insulin aspart  0-5 Units Subcutaneous QHS  . insulin aspart  0-9 Units Subcutaneous TID WC  . lanthanum  1,000 mg Oral TID WC  . mometasone-formoterol  2 puff Inhalation BID  . multivitamin with minerals  1 tablet Oral Daily  . oseltamivir  30 mg Oral Daily  . Warfarin - Pharmacist Dosing Inpatient   Does not apply [phone]

## 2016-01-16 NOTE — Progress Notes (Signed)
ANTICOAGULATION & ANTIBIOTIC CONSULT NOTE - Follow Up Consult  Pharmacy Consult for Warfarin Indication: atrial fibrillation   No Known Allergies  Patient Measurements: Height: 5\' 6"  (167.6 cm) Weight: 178 lb 12.7 oz (81.1 kg) IBW/kg (Calculated) : 63.8  Vital Signs: Temp: 98.4 F (36.9 C) (03/22 1000) Temp Source: Oral (03/22 1000) BP: 108/66 mmHg (03/22 1000) Pulse Rate: 67 (03/22 1000)  Labs:  Recent Labs  01/14/16 0557 01/15/16 0449 01/15/16 0726 01/16/16 0715  HGB 9.9*  --  9.9*  --   HCT 31.1*  --  29.2*  --   PLT 108*  --  105*  --   LABPROT 30.6* 32.8*  --  26.8*  INR 3.00* 3.29*  --  2.51*  CREATININE  --  8.56*  --   --     Estimated Creatinine Clearance: 8.8 mL/min (by C-G formula based on Cr of 8.56).   Assessment: 89 YOM who presented on 3/17 with SOB and penile ulcers. PTA the patient was on warfarin for hx Afib/CVA - pharmacy consulted to resume this admit. Per the coumadin clinic notes, the patient's INR goal of 2-2.5 on a PTA dose of 2.5 mg daily EXCEPT for 5 mg on MWF.   INR today is therapeutic of lower INR goal specified by the patient's coumadin clinic (INR 2.51 << 3.29, goal of 2-2.5). The patient was noted to have started on Ceftin po on 3/19 which may increase warfarin sensitivity. Hgb/Hct stable, plts 105 << 108- no overt s/sx of bleeding noted.    The patient has been re-educated on warfarin this admission.   Goal of Therapy:  INR 2-2.5   Plan:  1. Restart warfarin 2.5 mg x 1 dose at 1800 today 2. Will continue to monitor for any signs/symptoms of bleeding and will follow up with PT/INR in the a.m.   Alycia Rossetti, PharmD, BCPS Clinical Pharmacist Pager: [phone] 01/16/2016 1:55 PM

## 2016-01-16 NOTE — Progress Notes (Signed)
Pt with active emesis x 1. Unable to tolerate Ginger Ale. Page to K. Schorr for notification. Dorthey Sawyer, RN

## 2016-01-16 NOTE — Progress Notes (Signed)
[facility] PROGRESS NOTE  Tony Freeman Carlin [id number] DOB: 1951/04/10 DOA: 01/10/2016 PCP: Tanya Nones, MD  HPI/Brief narrative 65 year old African-American male with a past medical history of end-stage renal disease on hemodialysis, hypertension, hyperlipidemia, diabetes, coronary artery disease, chronic systolic and diastolic CHF, AICD placement, paroxysmal atrial fibrillation on Coumadin, presented with shortness of breath and ulcer on his penis. He was hospitalized for management of possible healthcare associated pneumonia and penile ulcer.  Assessment/Plan: Healthcare associated pneumonia with right-sided pleural effusion -was on vancomycin and cefepime, x2days -Blood cultures are neg so far. Urine for strep pneumonia antigen is negative. Influenza PCR is negative.  -Symptomatically improving, Abx changed to PO ceftin on 3/20. 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 RPR is nonreactive. on empiric acyclovir treatment. GC and chlamydia negative, no vesicles to suggest herpes -will d/w Renal regarding Na-thiosulphate with HD  History of coronary artery disease with mildly elevated troponin Stable. Patient denies any chest pain. Mildly elevated troponin, likely due to renal failure. No need for further workup at this time.  DM-II Continue SSI. Monitor CBGs.  Essential Hypertension -today had episode of hypotension -held Coreg, Cozaar, getting NS bolus  Chronic combined systolic and diastolic CHF (congestive heart failure)  2-D echo on 11/29/15 showed EF 15-20 percent. Patient has AICD placement. No leg edema. Volume is managed by HD - Continue antiplatelet agent.  Atrial Fibrillation CHA2DS2-VASc Score is 7, needs oral anticoagulation. Patient is on Coumadin at home. INR is therapeutic. Continue Coreg and amiodarone.  Continue Coumadin per pharmacy  HLD Continue home medications: Lipitor  ESRD-HD  (TTS) Nephrology is following. 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 -continue neurontin, PT following -will need supervision  Code Status: Full Family Communication: Pt in room Disposition Plan: home tomorrow if stable   Consultants:  Nephrology  Procedures:    Antibiotics: Anti-infectives    Start     Dose/Rate Route Frequency Ordered Stop   01/16/16 1030  oseltamivir (TAMIFLU) capsule 30 mg     30 mg Oral Daily 01/16/16 1028 01/21/16 0959   01/15/16 0000  cefUROXime (CEFTIN) 500 MG tablet     500 mg Oral Every 24 hours 01/15/16 1113     01/14/16 2000  acyclovir (ZOVIRAX) 200 MG capsule 200 mg     200 mg Oral 2 times daily 01/14/16 1131     01/13/16 1800  cefUROXime (CEFTIN) tablet 500 mg     500 mg Oral Every 24 hours 01/13/16 1528     01/13/16 1700  cefUROXime (CEFTIN) tablet 250 mg  Status:  Discontinued     250 mg Oral 2 times daily with meals 01/13/16 1521 01/13/16 1528   01/12/16 1800  ceFEPIme (MAXIPIME) 2 g in dextrose 5 % 50 mL IVPB  Status:  Discontinued     2 g 100 mL/hr over 30 Minutes Intravenous Every T-Th-Sa (1800) 01/10/16 2325 01/13/16 1521   01/12/16 1200  vancomycin (VANCOCIN) IVPB 1000 mg/200 mL premix  Status:  Discontinued     1,000 mg 200 mL/hr over 60 Minutes Intravenous Every T-Th-Sa (Hemodialysis) 01/10/16 2325 01/13/16 1521   01/11/16 2200  acyclovir (ZOVIRAX) 200 mg in dextrose 5 % 100 mL IVPB  Status:  Discontinued     200 mg 104 mL/hr over 60 Minutes Intravenous Every 24 hours 01/10/16 2330 01/14/16 1130   01/10/16 2345  acyclovir (ZOVIRAX) 200 mg in dextrose 5 % 100 mL IVPB  200 mg 104 mL/hr over 60 Minutes Intravenous  Once 01/10/16 2330 01/11/16 0202   01/10/16 2145  ceFEPIme (MAXIPIME) 2 g in dextrose 5 % 50 mL IVPB     2 g 100 mL/hr over 30 Minutes Intravenous  Once 01/10/16 2132 01/11/16 0256   01/10/16 2130  vancomycin (VANCOCIN) 1,750 mg in sodium chloride 0.9 % 500 mL IVPB     1,750  mg 250 mL/hr over 120 Minutes Intravenous  Once 01/10/16 2128 01/11/16 0004   01/10/16 2100  vancomycin (VANCOCIN) IVPB 1000 mg/200 mL premix  Status:  Discontinued     1,000 mg 200 mL/hr over 60 Minutes Intravenous  Once 01/10/16 2043 01/10/16 2128   01/10/16 2045  ceFEPIme (MAXIPIME) 1 g in dextrose 5 % 50 mL IVPB  Status:  Discontinued     1 g 100 mL/hr over 30 Minutes Intravenous  Once 01/10/16 2034 01/10/16 2132      HPI/Subjective: burning in feet better, c/o discomfort over penile lesion  Objective: Filed Vitals:   01/15/16 2100 01/16/16 0555 01/16/16 0821 01/16/16 1000  BP: 88/51 108/66  108/66  Pulse: 81 66  67  Temp: 98.4 F (36.9 C) 98.1 F (36.7 C)  98.4 F (36.9 C)  TempSrc: Oral Oral  Oral  Resp: 18 19  18   Height:      Weight:      SpO2: 100% 99% 94% 96%    Intake/Output Summary (Last 24 hours) at 01/16/16 1312 Last data filed at 01/16/16 0900  Gross per 24 hour  Intake    360 ml  Output      0 ml  Net    360 ml   Filed Weights   01/12/16 2150 01/15/16 0700 01/15/16 1035  Weight: 79 kg (174 lb 2.6 oz) 82.1 kg (181 lb) 81.1 kg (178 lb 12.7 oz)    Exam:   General:  AAOx3, no distress  Cardiovascular:S1S2/RRR  Respiratory: normal resp effort, no wheezing  Abdomen: soft, nondistended, pos BS  Musculoskeletal: perfused, no clubbing  Penis with small ulcer, very tender  Data Reviewed: Basic Metabolic Panel:  Recent Labs Lab 01/10/16 2005 01/11/16 1850 01/12/16 1917 01/15/16 0449  NA 137 135 135 131*  K 4.1 4.4 4.2 5.5*  CL 93* 94* 96* 94*  CO2 30 23 27 24   GLUCOSE 138* 78 121* 107*  BUN 14 29* 42* 56*  CREATININE 3.71* 5.23* 6.46* 8.56*  CALCIUM 9.6 9.6 9.5 9.5  PHOS  --  5.4* 5.3*  --    Liver Function Tests:  Recent Labs Lab 01/11/16 1850 01/12/16 1917  ALBUMIN 3.4* 3.1*   No results for input(s): LIPASE, AMYLASE in the last 168 hours. No results for input(s): AMMONIA in the last 168 hours. CBC:  Recent Labs Lab  01/10/16 2005 01/11/16 1850 01/12/16 1917 01/14/16 0557 01/15/16 0726  WBC 5.3 4.9 6.5 8.7 5.7  NEUTROABS 3.5  --   --   --   --   HGB 10.6* 10.1* 9.1* 9.9* 9.9*  HCT 32.7* 30.5* 29.0* 31.1* 29.2*  MCV 98.5 96.8 98.0 100.6* 98.3  PLT 153 93* 119* 108* 105*   Cardiac Enzymes:  Recent Labs Lab 01/11/16 0010 01/11/16 0716 01/11/16 0838  TROPONINI 0.09* 0.10* 0.10*   BNP (last 3 results)  Recent Labs  01/10/16 2006  BNP 4360.3*    ProBNP (last 3 results) No results for input(s): PROBNP in the last 8760 hours.  CBG:  Recent Labs Lab 01/15/16 1204 01/15/16 1606 01/15/16 2102  01/16/16 0818 01/16/16 1257  GLUCAP 88 156* 145* 88 113*    Recent Results (from the past 240 hour(s))  Culture, blood (routine x 2) Call MD if unable to obtain prior to antibiotics being given     Status: None (Preliminary result)   Collection Time: 01/11/16 12:05 AM  Result Value Ref Range Status   Specimen Description BLOOD RIGHT FOREARM  Final   Special Requests BOTTLES DRAWN AEROBIC ONLY 7ML  Final   Culture NO GROWTH 4 DAYS  Final   Report Status PENDING  Incomplete  Culture, blood (routine x 2) Call MD if unable to obtain prior to antibiotics being given     Status: None (Preliminary result)   Collection Time: 01/11/16 12:10 AM  Result Value Ref Range Status   Specimen Description BLOOD RIGHT HAND  Final   Special Requests BOTTLES DRAWN AEROBIC AND ANAEROBIC 5ML  Final   Culture NO GROWTH 4 DAYS  Final   Report Status PENDING  Incomplete  Wound culture     Status: None   Collection Time: 01/11/16 12:40 AM  Result Value Ref Range Status   Specimen Description ULCER  Final   Special Requests PENIS  Final   Gram Stain   Final    NO WBC SEEN NO SQUAMOUS EPITHELIAL CELLS SEEN NO ORGANISMS SEEN Performed at [facility]    Culture   Final    NORMAL SKIN FLORA Performed at [facility]    Report Status 01/14/2016 FINAL  Final  Respiratory virus panel      Status: Abnormal   Collection Time: 01/11/16 12:40 AM  Result Value Ref Range Status   Respiratory Syncytial Virus A Negative Negative Final   Respiratory Syncytial Virus B Negative Negative Final   Influenza A Negative Negative Final   Influenza B Positive (A) Negative Final   Parainfluenza 1 Negative Negative Final   Parainfluenza 2 Negative Negative Final   Parainfluenza 3 Negative Negative Final   Metapneumovirus Negative Negative Final   Rhinovirus Negative Negative Final   Adenovirus Negative Negative Final    Comment: (NOTE) Performed At: [facility] [place], [place] [id number] Lindon Romp MD [id number]   MRSA PCR Screening     Status: None   Collection Time: 01/11/16 12:40 AM  Result Value Ref Range Status   MRSA by PCR NEGATIVE NEGATIVE Final    Comment:        The GeneXpert MRSA Assay (FDA approved for NASAL specimens only), is one component of a comprehensive MRSA colonization surveillance program. It is not intended to diagnose MRSA infection nor to guide or monitor treatment for MRSA infections.      Studies: No results found.  Scheduled Meds: . acyclovir  200 mg Oral BID  . amiodarone  100 mg Oral Daily  . atorvastatin  40 mg Oral Daily  . cefUROXime  500 mg Oral Q24H  . clopidogrel  75 mg Oral Daily  . dextromethorphan-guaiFENesin  1 tablet Oral BID  . feeding supplement (NEPRO CARB STEADY)  237 mL Oral BID BM  . [START ON 01/17/2016] ferric gluconate (FERRLECIT/NULECIT) IV  62.5 mg Intravenous Q Thu-HD  . gabapentin  300 mg Oral QHS  . insulin aspart  0-5 Units Subcutaneous QHS  . insulin aspart  0-9 Units Subcutaneous TID WC  . lanthanum  1,000 mg Oral TID WC  . mometasone-formoterol  2 puff Inhalation BID  . multivitamin with minerals  1 tablet Oral Daily  . oseltamivir  30 mg Oral Daily  . sodium chloride  1,000 mL Intravenous Once  . Warfarin - Pharmacist Dosing Inpatient   Does not apply q1800   Continuous  Infusions:   Principal Problem:   HCAP (healthcare-associated pneumonia) Active Problems:   Elevated troponin   DM (diabetes mellitus), type 2, uncontrolled, with renal complications (HCC)   Essential hypertension, benign   Chronic combined systolic and diastolic CHF (congestive heart failure) (HCC)   CAD (coronary artery disease)   PAF (paroxysmal atrial fibrillation) (HCC)   Atrial fibrillation (HCC)   HLD (hyperlipidemia)   ESRD (end stage renal disease) on dialysis ([place])   Ulcer of penis   Calcification of soft tissue    Johari Pinney  [facility] Pager [phone]. If 7PM-7AM, please contact night-coverage at [email or web address], password [email or web address] 01/16/2016, 1:12 PM  LOS: 6 days

## 2016-01-16 NOTE — Progress Notes (Signed)
Finished 1Lt NS bolus, checked the VS BP manually 108/68, automatic 99/71, pulse 66.

## 2016-01-16 NOTE — Care Management Important Message (Signed)
Important Message  Patient Details  Name: Tony Freeman MRN: [id number] Date of Birth: Oct 11, 1951   Medicare Important Message Given:  Yes    Florance Paolillo, Rory Percy, RN 01/16/2016, 10:51 AM

## 2016-01-16 NOTE — Progress Notes (Signed)
Patient's standing BP 104/64.

## 2016-01-17 LAB — BASIC METABOLIC PANEL
Anion gap: 13 (ref 5–15)
BUN: 47 mg/dL — ABNORMAL HIGH (ref 6–20)
CO2: 24 mmol/L (ref 22–32)
CREATININE: 7.17 mg/dL — AB (ref 0.61–1.24)
Calcium: 9.5 mg/dL (ref 8.9–10.3)
Chloride: 97 mmol/L — ABNORMAL LOW (ref 101–111)
GFR calc non Af Amer: 7 mL/min — ABNORMAL LOW (ref >60)
GFR, EST AFRICAN AMERICAN: 8 mL/min — AB (ref >60)
Glucose, Bld: 90 mg/dL (ref 65–99)
Potassium: 5.2 mmol/L — ABNORMAL HIGH (ref 3.5–5.1)
Sodium: 134 mmol/L — ABNORMAL LOW (ref 135–145)

## 2016-01-17 LAB — GLUCOSE, CAPILLARY
GLUCOSE-CAPILLARY: 67 mg/dL (ref 65–99)
GLUCOSE-CAPILLARY: 153 mg/dL — AB (ref 65–99)
GLUCOSE-CAPILLARY: 111 mg/dL — AB (ref 65–99)

## 2016-01-17 LAB — CBC
HEMATOCRIT: 31.2 % — AB (ref 39.0–52.0)
HEMOGLOBIN: 9.6 g/dL — AB (ref 13.0–17.0)
MCH: 30.7 pg (ref 26.0–34.0)
MCHC: 30.8 g/dL (ref 30.0–36.0)
MCV: 99.7 fL (ref 78.0–100.0)
Platelets: 116 10*3/uL — ABNORMAL LOW (ref 150–400)
RBC: 3.13 MIL/uL — AB (ref 4.22–5.81)
RDW: 16.2 % — ABNORMAL HIGH (ref 11.5–15.5)
WBC: 5.3 10*3/uL (ref 4.0–10.5)

## 2016-01-17 LAB — PROTIME-INR
INR: 2.11 — AB (ref 0.00–1.49)
Prothrombin Time: 23.5 seconds — ABNORMAL HIGH (ref 11.6–15.2)

## 2016-01-17 MED ORDER — SODIUM CHLORIDE 0.9 % IV SOLN: 100.0000 mL | INTRAVENOUS | Status: DC | PRN

## 2016-01-17 MED ORDER — HEPARIN SODIUM (PORCINE) 1000 UNIT/ML DIALYSIS: 2000.0000 [IU] | Freq: Once | INTRAMUSCULAR | Status: DC

## 2016-01-17 MED ORDER — ACETAMINOPHEN 325 MG PO TABS
ORAL_TABLET | ORAL | Status: AC
  Filled 2016-01-17: qty 2

## 2016-01-17 MED ORDER — PENTAFLUOROPROP-TETRAFLUOROETH EX AERO: 1.0000 "application " | INHALATION_SPRAY | CUTANEOUS | Status: DC | PRN

## 2016-01-17 MED ORDER — LIDOCAINE-PRILOCAINE 2.5-2.5 % EX CREA: 1.0000 "application " | TOPICAL_CREAM | CUTANEOUS | Status: DC | PRN

## 2016-01-17 MED ORDER — ALTEPLASE 2 MG IJ SOLR: 2.0000 mg | Freq: Once | INTRAMUSCULAR | Status: DC | PRN

## 2016-01-17 MED ORDER — WARFARIN SODIUM 2.5 MG PO TABS
2.5000 mg | ORAL_TABLET | Freq: Once | ORAL | Status: AC
  Administered 2016-01-17: 2.5 mg via ORAL
  Filled 2016-01-17: qty 1

## 2016-01-17 MED ORDER — HEPARIN SODIUM (PORCINE) 1000 UNIT/ML DIALYSIS: 1000.0000 [IU] | INTRAMUSCULAR | Status: DC | PRN

## 2016-01-17 MED ORDER — RENA-VITE PO TABS
1.0000 | ORAL_TABLET | Freq: Every day | ORAL | Status: DC
  Administered 2016-01-18: 1 via ORAL
  Filled 2016-01-17: qty 1

## 2016-01-17 MED ORDER — LIDOCAINE HCL (PF) 1 % IJ SOLN: 5.0000 mL | INTRAMUSCULAR | Status: DC | PRN

## 2016-01-17 NOTE — Progress Notes (Signed)
[facility] Progress Note  Assessment/Plan: 1. Hypotension/ lightheadedness - orthostatic BP sitting 105/66 lying 100/60. Has not been OOB today.  2. HCAP with R. Pleural Effusion: Per primary. Initially on Vanc/Zosyn. Changed to Ceftin. Tmax 98.5 3   ESRD -TTS @ [facility]. . HD today per schedule.  4. 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 For now will use preliminary measures w low Ca bath and no Ca-based binders or vit D. If lesion gets bigger will refer for tissue biopsy. Lesion not big enough for biopsy at this time. Last PTH 95.  5. HTN/volume - Orthostatic BP improved. Had HD today net UF 3000. SBP 90-100s during HD. Post wt 81.2. Still above OP EDW. Has not been out of bed today.   6. Nutrition - albumin 3.1 Renal/Carb mod diet, renal vit/protein supplement.  7. Penile lesion/ painful/ necrotic / small: necrotic skin lesion approximately 1.5 cm diameter, painful to touch. poss early calciphylaxis. Has been started on sodium thiosulfate.  8. Afib: DM per primary. CHA2DS2-VASc Score 7. Coumadin per pharmacy INR 2.11 today.  9. DM: per primary 10. 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 Has ICD. Dual chamber pacemaker 11. Anemia - Hgb 9.6 OP ESA dose given 03/14. Follow HGB.  Disposition: For possible DC tomorrow. If so, will have HD at home unit.    [name] NP-C 01/17/2016, 3:13 PM  [facility] [phone]  Pt seen, examined and agree w A/P as above.  Kelly Splinter MD [facility] pager [phone]    cell [phone] 01/17/2016, 4:40 PM    Subjective: "I'm still a little light headed". Denies SOB.   Objective Filed Vitals:   01/17/16 1135 01/17/16 1144 01/17/16 1306 01/17/16 1310  BP: 170/134 100/60    Pulse: 52     Temp:      TempSrc: Oral     Resp:      Height:      Weight:      SpO2: 94%  86% 92%   Physical Exam General: Well nourished, NAD Heart: S1, S2 II/VI systolic M  Lungs: Bilateral breath sounds,  essentially clear at present. No WOB Abdomen: soft nontender, active BS Extremities: No LE edema Dialysis Access: R perm cath Drsg CDI nonmaturing LUA AVF Dialysis Orders: [facility]  TTS 4.25 hr 400/800 EDW 79.5 2 K 2.25 Ca profile 4, heparin 2600 units venofer 50mg  3/16 Mircera 75 mcg q 2 weeks   Additional Objective Labs: Basic Metabolic Panel:  Recent Labs Lab 01/11/16 1850 01/12/16 1917 01/15/16 0449 01/16/16 1657 01/17/16 0635  NA 135 135 131* 133* 134*  K 4.4 4.2 5.5* 4.8 5.2*  CL 94* 96* 94* 96* 97*  CO2 23 27 24 27 24   GLUCOSE 78 121* 107* 147* 90  BUN 29* 42* 56* 38* 47*  CREATININE 5.23* 6.46* 8.56* 6.49* 7.17*  CALCIUM 9.6 9.5 9.5 9.3 9.5  PHOS 5.4* 5.3*  --   --   --    Liver Function Tests:  Recent Labs Lab 01/11/16 1850 01/12/16 1917  ALBUMIN 3.4* 3.1*   No results for input(s): LIPASE, AMYLASE in the last 168 hours. CBC:  Recent Labs Lab 01/10/16 2005  01/12/16 1917 01/14/16 0557 01/15/16 0726 01/16/16 1657 01/17/16 0635  WBC 5.3  < > 6.5 8.7 5.7 5.6 5.3  NEUTROABS 3.5  --   --   --   --   --   --   HGB 10.6*  < > 9.1* 9.9* 9.9* 9.3* 9.6*  HCT  32.7*  < > 29.0* 31.1* 29.2* 30.0* 31.2*  MCV 98.5  < > 98.0 100.6* 98.3 99.3 99.7  PLT 153  < > 119* 108* 105* 110* 116*  < > = values in this interval not displayed. Blood Culture    Component Value Date/Time   SDES ULCER 01/11/2016 0040   SPECREQUEST PENIS 01/11/2016 0040   CULT  01/11/2016 0040    NORMAL SKIN FLORA Performed at [facility] 01/14/2016 FINAL 01/11/2016 0040    Cardiac Enzymes:  Recent Labs Lab 01/11/16 0010 01/11/16 0716 01/11/16 0838  TROPONINI 0.09* 0.10* 0.10*   CBG:  Recent Labs Lab 01/15/16 2102 01/16/16 0818 01/16/16 1257 01/16/16 1755 01/17/16 1212  GLUCAP 145* 88 113* 135* 67   Iron Studies: No results for input(s): IRON, TIBC, TRANSFERRIN, FERRITIN in the last 72 hours. @lablastinr3 @ Studies/Results: Dg Chest Port 1  View  01/16/2016  CLINICAL DATA:  Hypoxemia.  Shortness of breath for 1 day. EXAM: PORTABLE CHEST 1 VIEW COMPARISON:  01/10/2016 FINDINGS: Right jugular dual-lumen catheter remains in place, terminating over the lower SVC. Dual lead pacemaker remains in place. The cardiac silhouette remains enlarged, unchanged. Thoracic aortic and coronary artery atherosclerosis is again seen. Moderate right and small left pleural effusions have not significantly changed. There is persistent right basilar atelectasis or consolidation which has likely not significantly changed. There is slight central pulmonary vascular congestion without overt edema. No pneumothorax is seen. IMPRESSION: Persistent moderate right and small left pleural effusions with right basilar atelectasis or consolidation. Electronically Signed   By: Logan Bores M.D.   On: 01/16/2016 17:24   Medications:   . amiodarone  100 mg Oral Daily  . atorvastatin  40 mg Oral Daily  . cefUROXime  500 mg Oral Q24H  . clopidogrel  75 mg Oral Daily  . dextromethorphan-guaiFENesin  1 tablet Oral BID  . feeding supplement (NEPRO CARB STEADY)  237 mL Oral BID BM  . ferric gluconate (FERRLECIT/NULECIT) IV  62.5 mg Intravenous Q Thu-HD  . gabapentin  300 mg Oral QHS  . insulin aspart  0-5 Units Subcutaneous QHS  . insulin aspart  0-9 Units Subcutaneous TID WC  . lanthanum  1,000 mg Oral TID WC  . mometasone-formoterol  2 puff Inhalation BID  . [START ON 01/18/2016] multivitamin  1 tablet Oral QHS  . oseltamivir  30 mg Oral Q T,Th,Sat-1800  . sodium thiosulfate infusion for calciphylaxis  25 g Intravenous Q T,Th,Sa-HD  . warfarin  2.5 mg Oral ONCE-1800  . Warfarin - Pharmacist Dosing Inpatient   Does not apply [phone]

## 2016-01-17 NOTE — Progress Notes (Addendum)
[facility] PROGRESS NOTE  Tony Freeman [id number] DOB: October 09, 1951 DOA: 01/10/2016 PCP: Tanya Nones, MD  HPI/Brief narrative 65 year old African-American male with a past medical history of end-stage renal disease on hemodialysis, hypertension, hyperlipidemia, diabetes, coronary artery disease, chronic systolic and diastolic CHF, AICD placement, paroxysmal atrial fibrillation on Coumadin, presented with shortness of breath and ulcer on his penis. He was hospitalized for management of possible healthcare associated pneumonia and penile ulcer.  Assessment/Plan: Influenza B/Pneumonia with right-sided pleural effusion -was on vancomycin and cefepime, x2days -Blood cultures are neg so far. Urine for strep pneumonia antigen is negative. Resp virus panel positive for Flu B even though Influenza PCR was negative, now on tamiflu -Symptomatically improving, Abx changed to PO ceftin on 3/20. 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 RPR is nonreactive.stop empiric acyclovir treatment, no vesicles to suggest herpes -d/w Renal regarding Na-thiosulphate with HD  History of coronary artery disease with mildly elevated troponin Stable. Patient denies any chest pain. Mildly elevated troponin, likely due to renal failure. No need for further workup at this time.  DM-II Continue SSI. Monitor CBGs.  Essential Hypertension -3/22 had episode of hypotension, bp improved but still soft -hold Coreg, Cozaar  Chronic combined systolic and diastolic CHF (congestive heart failure)  2-D echo on 11/29/15 showed EF 15-20 percent. Patient has AICD placement. No leg edema. Volume is managed by HD - Continue antiplatelet agent.  Atrial Fibrillation CHA2DS2-VASc Score is 7 -Continue Coreg, amiodarone, Coumadin per pharmacy  HLD Continue home medications: Lipitor  ESRD-HD (TTS) Nephrology is following. To be dialyzed on TTS  schedule  Possible Peripheral Neuropathy related to DM -continue neurontin, PT following -will need supervision per PT  Code Status: Full Family Communication: Pt in room Disposition Plan: home tomorrow if stable   Consultants:  Nephrology  Procedures:    Antibiotics: Anti-infectives    Start     Dose/Rate Route Frequency Ordered Stop   01/17/16 1800  oseltamivir (TAMIFLU) capsule 30 mg     30 mg Oral Every T-Th-Sa (1800) 01/16/16 1351 01/22/16 1759   01/16/16 1030  oseltamivir (TAMIFLU) capsule 30 mg  Status:  Discontinued     30 mg Oral Daily 01/16/16 1028 01/16/16 1351   01/15/16 0000  cefUROXime (CEFTIN) 500 MG tablet     500 mg Oral Every 24 hours 01/15/16 1113     01/14/16 2000  acyclovir (ZOVIRAX) 200 MG capsule 200 mg  Status:  Discontinued     200 mg Oral 2 times daily 01/14/16 1131 01/17/16 1114   01/13/16 1800  cefUROXime (CEFTIN) tablet 500 mg     500 mg Oral Every 24 hours 01/13/16 1528     01/13/16 1700  cefUROXime (CEFTIN) tablet 250 mg  Status:  Discontinued     250 mg Oral 2 times daily with meals 01/13/16 1521 01/13/16 1528   01/12/16 1800  ceFEPIme (MAXIPIME) 2 g in dextrose 5 % 50 mL IVPB  Status:  Discontinued     2 g 100 mL/hr over 30 Minutes Intravenous Every T-Th-Sa (1800) 01/10/16 2325 01/13/16 1521   01/12/16 1200  vancomycin (VANCOCIN) IVPB 1000 mg/200 mL premix  Status:  Discontinued     1,000 mg 200 mL/hr over 60 Minutes Intravenous Every T-Th-Sa (Hemodialysis) 01/10/16 2325 01/13/16 1521   01/11/16 2200  acyclovir (ZOVIRAX) 200 mg in dextrose 5 % 100 mL IVPB  Status:  Discontinued     200 mg 104 mL/hr over 60 Minutes Intravenous  Every 24 hours 01/10/16 2330 01/14/16 1130   01/10/16 2345  acyclovir (ZOVIRAX) 200 mg in dextrose 5 % 100 mL IVPB     200 mg 104 mL/hr over 60 Minutes Intravenous  Once 01/10/16 2330 01/11/16 0202   01/10/16 2145  ceFEPIme (MAXIPIME) 2 g in dextrose 5 % 50 mL IVPB     2 g 100 mL/hr over 30 Minutes Intravenous   Once 01/10/16 2132 01/11/16 0256   01/10/16 2130  vancomycin (VANCOCIN) 1,750 mg in sodium chloride 0.9 % 500 mL IVPB     1,750 mg 250 mL/hr over 120 Minutes Intravenous  Once 01/10/16 2128 01/11/16 0004   01/10/16 2100  vancomycin (VANCOCIN) IVPB 1000 mg/200 mL premix  Status:  Discontinued     1,000 mg 200 mL/hr over 60 Minutes Intravenous  Once 01/10/16 2043 01/10/16 2128   01/10/16 2045  ceFEPIme (MAXIPIME) 1 g in dextrose 5 % 50 mL IVPB  Status:  Discontinued     1 g 100 mL/hr over 30 Minutes Intravenous  Once 01/10/16 2034 01/10/16 2132      HPI/Subjective: burning in feet better, c/o discomfort over penile lesion  Objective: Filed Vitals:   01/17/16 0930 01/17/16 1000 01/17/16 1030 01/17/16 1036  BP: 96/64 99/61 98/65  105/66  Pulse: 65 65 71 67  Temp:    97.1 F (36.2 C)  TempSrc:    Oral  Resp: 14 18 15 15   Height:      Weight:    81.2 kg (179 lb 0.2 oz)  SpO2:        Intake/Output Summary (Last 24 hours) at 01/17/16 1114 Last data filed at 01/17/16 1036  Gross per 24 hour  Intake      0 ml  Output   3000 ml  Net  -3000 ml   Filed Weights   01/15/16 1035 01/17/16 0650 01/17/16 1036  Weight: 81.1 kg (178 lb 12.7 oz) 84.1 kg (185 lb 6.5 oz) 81.2 kg (179 lb 0.2 oz)    Exam:   General:  AAOx3, no distress  Cardiovascular:S1S2/RRR  Respiratory: normal resp effort, no wheezing  Abdomen: soft, nondistended, pos BS  Musculoskeletal: perfused, no clubbing  Penis with small ulcer, very tender  Data Reviewed: Basic Metabolic Panel:  Recent Labs Lab 01/11/16 1850 01/12/16 1917 01/15/16 0449 01/16/16 1657 01/17/16 0635  NA 135 135 131* 133* 134*  K 4.4 4.2 5.5* 4.8 5.2*  CL 94* 96* 94* 96* 97*  CO2 23 27 24 27 24   GLUCOSE 78 121* 107* 147* 90  BUN 29* 42* 56* 38* 47*  CREATININE 5.23* 6.46* 8.56* 6.49* 7.17*  CALCIUM 9.6 9.5 9.5 9.3 9.5  PHOS 5.4* 5.3*  --   --   --    Liver Function Tests:  Recent Labs Lab 01/11/16 1850 01/12/16 1917   ALBUMIN 3.4* 3.1*   No results for input(s): LIPASE, AMYLASE in the last 168 hours. No results for input(s): AMMONIA in the last 168 hours. CBC:  Recent Labs Lab 01/10/16 2005  01/12/16 1917 01/14/16 0557 01/15/16 0726 01/16/16 1657 01/17/16 0635  WBC 5.3  < > 6.5 8.7 5.7 5.6 5.3  NEUTROABS 3.5  --   --   --   --   --   --   HGB 10.6*  < > 9.1* 9.9* 9.9* 9.3* 9.6*  HCT 32.7*  < > 29.0* 31.1* 29.2* 30.0* 31.2*  MCV 98.5  < > 98.0 100.6* 98.3 99.3 99.7  PLT 153  < > 119*  108* 105* 110* 116*  < > = values in this interval not displayed. Cardiac Enzymes:  Recent Labs Lab 01/11/16 0010 01/11/16 0716 01/11/16 0838  TROPONINI 0.09* 0.10* 0.10*   BNP (last 3 results)  Recent Labs  01/10/16 2006  BNP 4360.3*    ProBNP (last 3 results) No results for input(s): PROBNP in the last 8760 hours.  CBG:  Recent Labs Lab 01/15/16 1606 01/15/16 2102 01/16/16 0818 01/16/16 1257 01/16/16 1755  GLUCAP 156* 145* 88 113* 135*    Recent Results (from the past 240 hour(s))  Culture, blood (routine x 2) Call MD if unable to obtain prior to antibiotics being given     Status: None   Collection Time: 01/11/16 12:05 AM  Result Value Ref Range Status   Specimen Description BLOOD RIGHT FOREARM  Final   Special Requests BOTTLES DRAWN AEROBIC ONLY 7ML  Final   Culture NO GROWTH 5 DAYS  Final   Report Status 01/16/2016 FINAL  Final  Culture, blood (routine x 2) Call MD if unable to obtain prior to antibiotics being given     Status: None   Collection Time: 01/11/16 12:10 AM  Result Value Ref Range Status   Specimen Description BLOOD RIGHT HAND  Final   Special Requests BOTTLES DRAWN AEROBIC AND ANAEROBIC 5ML  Final   Culture NO GROWTH 5 DAYS  Final   Report Status 01/16/2016 FINAL  Final  Wound culture     Status: None   Collection Time: 01/11/16 12:40 AM  Result Value Ref Range Status   Specimen Description ULCER  Final   Special Requests PENIS  Final   Gram Stain   Final     NO WBC SEEN NO SQUAMOUS EPITHELIAL CELLS SEEN NO ORGANISMS SEEN Performed at [facility]    Culture   Final    NORMAL SKIN FLORA Performed at [facility]    Report Status 01/14/2016 FINAL  Final  Respiratory virus panel     Status: Abnormal   Collection Time: 01/11/16 12:40 AM  Result Value Ref Range Status   Respiratory Syncytial Virus A Negative Negative Final   Respiratory Syncytial Virus B Negative Negative Final   Influenza A Negative Negative Final   Influenza B Positive (A) Negative Final   Parainfluenza 1 Negative Negative Final   Parainfluenza 2 Negative Negative Final   Parainfluenza 3 Negative Negative Final   Metapneumovirus Negative Negative Final   Rhinovirus Negative Negative Final   Adenovirus Negative Negative Final    Comment: (NOTE) Performed At: [facility] [place], [place] [id number] Lindon Romp MD [id number]   MRSA PCR Screening     Status: None   Collection Time: 01/11/16 12:40 AM  Result Value Ref Range Status   MRSA by PCR NEGATIVE NEGATIVE Final    Comment:        The GeneXpert MRSA Assay (FDA approved for NASAL specimens only), is one component of a comprehensive MRSA colonization surveillance program. It is not intended to diagnose MRSA infection nor to guide or monitor treatment for MRSA infections.      Studies: Dg Chest Port 1 View  01/16/2016  CLINICAL DATA:  Hypoxemia.  Shortness of breath for 1 day. EXAM: PORTABLE CHEST 1 VIEW COMPARISON:  01/10/2016 FINDINGS: Right jugular dual-lumen catheter remains in place, terminating over the lower SVC. Dual lead pacemaker remains in place. The cardiac silhouette remains enlarged, unchanged. Thoracic aortic and coronary artery atherosclerosis is again seen. Moderate right and small  left pleural effusions have not significantly changed. There is persistent right basilar atelectasis or consolidation which has likely not significantly changed. There  is slight central pulmonary vascular congestion without overt edema. No pneumothorax is seen. IMPRESSION: Persistent moderate right and small left pleural effusions with right basilar atelectasis or consolidation. Electronically Signed   By: Logan Bores M.D.   On: 01/16/2016 17:24    Scheduled Meds: . acetaminophen      . amiodarone  100 mg Oral Daily  . atorvastatin  40 mg Oral Daily  . cefUROXime  500 mg Oral Q24H  . clopidogrel  75 mg Oral Daily  . dextromethorphan-guaiFENesin  1 tablet Oral BID  . feeding supplement (NEPRO CARB STEADY)  237 mL Oral BID BM  . ferric gluconate (FERRLECIT/NULECIT) IV  62.5 mg Intravenous Q Thu-HD  . gabapentin  300 mg Oral QHS  . insulin aspart  0-5 Units Subcutaneous QHS  . insulin aspart  0-9 Units Subcutaneous TID WC  . lanthanum  1,000 mg Oral TID WC  . mometasone-formoterol  2 puff Inhalation BID  . multivitamin with minerals  1 tablet Oral Daily  . oseltamivir  30 mg Oral Q T,Th,Sat-1800  . sodium thiosulfate infusion for calciphylaxis  25 g Intravenous Q T,Th,Sa-HD  . Warfarin - Pharmacist Dosing Inpatient   Does not apply q1800   Continuous Infusions:   Principal Problem:   HCAP (healthcare-associated pneumonia) Active Problems:   Elevated troponin   DM (diabetes mellitus), type 2, uncontrolled, with renal complications (HCC)   Essential hypertension, benign   Chronic combined systolic and diastolic CHF (congestive heart failure) (HCC)   CAD (coronary artery disease)   PAF (paroxysmal atrial fibrillation) (HCC)   Atrial fibrillation (HCC)   HLD (hyperlipidemia)   ESRD (end stage renal disease) on dialysis ([place])   Ulcer of penis   Calcification of soft tissue    Tony Freeman  [facility] Pager [phone]. If 7PM-7AM, please contact night-coverage at [email or web address], password [facility] 01/17/2016, 11:14 AM  LOS: 7 days

## 2016-01-17 NOTE — Progress Notes (Signed)
ANTICOAGULATION CONSULT NOTE - Follow Up Consult  Pharmacy Consult for Coumadin Indication: atrial fibrillation  No Known Allergies  Patient Measurements: Height: 5\' 6"  (167.6 cm) Weight: 179 lb 0.2 oz (81.2 kg) (standing scale) IBW/kg (Calculated) : 63.8  Vital Signs: Temp: 97.1 F (36.2 C) (03/23 1036) Temp Source: Oral (03/23 1135) BP: 100/60 mmHg (03/23 1144) Pulse Rate: 52 (03/23 1135)  Labs:  Recent Labs  01/15/16 0449  01/15/16 0726 01/16/16 0715 01/16/16 1657 01/17/16 0635  HGB  --   < > 9.9*  --  9.3* 9.6*  HCT  --   --  29.2*  --  30.0* 31.2*  PLT  --   --  105*  --  110* 116*  LABPROT 32.8*  --   --  26.8*  --  23.5*  INR 3.29*  --   --  2.51*  --  2.11*  CREATININE 8.56*  --   --   --  6.49* 7.17*  < > = values in this interval not displayed.  Estimated Creatinine Clearance: 10.6 mL/min (by C-G formula based on Cr of 7.17).  Assessment: 47 YOM who presented on 3/17 with SOB and penile ulcers. PTA the patient was on warfarin for hx Afib/CVA - pharmacy consulted to resume this admit. Per the coumadin clinic notes, the patient's INR goal of 2-2.5 on a PTA dose of 2.5 mg on TTSS and  5 mg on MWF.  INR is 2.11 today, back into low target range after above goal for several days. Day # 7 antibiotics for HCAP, currently on Ceftin 500 mg daily at 6pm, which may have effected INR/Coumadin sensitivity.   Day # 2 Tamiflu. Hgb 9.6, platelet count low stable, no bleeding noted.  Weekly IV iron given today. Outpt [facility] q2 wks given 3/14.   Goal of Therapy:  INR 2-2.5 Monitor platelets by anticoagulation protocol: Yes   Plan:   Coumadin 2.5 mg again today, usual Thursday dose.  Daily PT/INR.  Arty Baumgartner, [place] Pager: [phone] 01/17/2016,1:35 PM

## 2016-01-18 ENCOUNTER — Inpatient Hospital Stay (HOSPITAL_COMMUNITY): Payer: Medicare HMO

## 2016-01-18 LAB — GLUCOSE, CAPILLARY
GLUCOSE-CAPILLARY: 184 mg/dL — AB (ref 65–99)
GLUCOSE-CAPILLARY: 112 mg/dL — AB (ref 65–99)
Glucose-Capillary: 154 mg/dL — ABNORMAL HIGH (ref 65–99)

## 2016-01-18 LAB — PROTIME-INR
INR: 1.98 — ABNORMAL HIGH (ref 0.00–1.49)
Prothrombin Time: 22.4 seconds — ABNORMAL HIGH (ref 11.6–15.2)

## 2016-01-18 MED ORDER — WARFARIN SODIUM 5 MG PO TABS
5.0000 mg | ORAL_TABLET | Freq: Once | ORAL | Status: AC
  Administered 2016-01-18: 5 mg via ORAL
  Filled 2016-01-18: qty 1

## 2016-01-18 NOTE — Progress Notes (Signed)
[facility] PROGRESS NOTE  Tony Freeman [id number] DOB: [birth year] DOA: 01/10/2016 PCP: Tanya Nones, MD  HPI/Brief narrative 65 year old African-American male with a past medical history of end-stage renal disease on hemodialysis, hypertension, hyperlipidemia, diabetes, coronary artery disease, chronic systolic and diastolic CHF, AICD placement, paroxysmal atrial fibrillation on Coumadin, presented with shortness of breath and ulcer on his penis. He was hospitalized for management of possible healthcare associated pneumonia and penile ulcer.  Assessment/Plan: Influenza B/Pneumonia with right-sided pleural effusion -was on vancomycin and cefepime, x2days -Blood cultures are neg so far. Urine for strep pneumonia antigen is negative. Resp virus panel positive for Flu B even though Influenza PCR was negative, now on tamiflu -Symptomatically improving, Abx changed to PO ceftin on 3/20. 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 RPR is nonreactive.stop empiric acyclovir treatment, no vesicles to suggest herpes -d/w Renal regarding Na-thiosulphate with HD -pain control  History of coronary artery disease with mildly elevated troponin Stable. Patient denies any chest pain. Mildly elevated troponin, likely due to renal failure. No need for further workup at this time.  DM-II Continue SSI. Monitor CBGs.  Essential Hypertension -3/22 had episode of hypotension, bp improved but still soft -hold Coreg, Cozaar  Chronic combined systolic and diastolic CHF (congestive heart failure)  2-D echo on 11/29/15 showed EF 15-20 percent. Patient has AICD placement. No leg edema. Volume is managed by HD - Continue antiplatelet agent.  Atrial Fibrillation CHA2DS2-VASc Score is 7 -Continue Coreg, amiodarone, Coumadin per pharmacy  HLD Continue home medications: Lipitor  ESRD-HD (TTS) Nephrology is following. To be dialyzed on TTS  schedule  Possible Peripheral Neuropathy related to DM -continue neurontin, PT following -will need supervision per PT -still some dizziness  Code Status: Full Family Communication: Pt in room Disposition Plan: home later today   Consultants:  Nephrology  Procedures:    Antibiotics: Anti-infectives    Start     Dose/Rate Route Frequency Ordered Stop   01/17/16 1800  oseltamivir (TAMIFLU) capsule 30 mg     30 mg Oral Every T-Th-Sa (1800) 01/16/16 1351 01/22/16 1759   01/16/16 1030  oseltamivir (TAMIFLU) capsule 30 mg  Status:  Discontinued     30 mg Oral Daily 01/16/16 1028 01/16/16 1351   01/15/16 0000  cefUROXime (CEFTIN) 500 MG tablet     500 mg Oral Every 24 hours 01/15/16 1113     01/14/16 2000  acyclovir (ZOVIRAX) 200 MG capsule 200 mg  Status:  Discontinued     200 mg Oral 2 times daily 01/14/16 1131 01/17/16 1114   01/13/16 1800  cefUROXime (CEFTIN) tablet 500 mg     500 mg Oral Every 24 hours 01/13/16 1528     01/13/16 1700  cefUROXime (CEFTIN) tablet 250 mg  Status:  Discontinued     250 mg Oral 2 times daily with meals 01/13/16 1521 01/13/16 1528   01/12/16 1800  ceFEPIme (MAXIPIME) 2 g in dextrose 5 % 50 mL IVPB  Status:  Discontinued     2 g 100 mL/hr over 30 Minutes Intravenous Every T-Th-Sa (1800) 01/10/16 2325 01/13/16 1521   01/12/16 1200  vancomycin (VANCOCIN) IVPB 1000 mg/200 mL premix  Status:  Discontinued     1,000 mg 200 mL/hr over 60 Minutes Intravenous Every T-Th-Sa (Hemodialysis) 01/10/16 2325 01/13/16 1521   01/11/16 2200  acyclovir (ZOVIRAX) 200 mg in dextrose 5 % 100 mL IVPB  Status:  Discontinued     200 mg 104 mL/hr over  60 Minutes Intravenous Every 24 hours 01/10/16 2330 01/14/16 1130   01/10/16 2345  acyclovir (ZOVIRAX) 200 mg in dextrose 5 % 100 mL IVPB     200 mg 104 mL/hr over 60 Minutes Intravenous  Once 01/10/16 2330 01/11/16 0202   01/10/16 2145  ceFEPIme (MAXIPIME) 2 g in dextrose 5 % 50 mL IVPB     2 g 100 mL/hr over 30 Minutes  Intravenous  Once 01/10/16 2132 01/11/16 0256   01/10/16 2130  vancomycin (VANCOCIN) 1,750 mg in sodium chloride 0.9 % 500 mL IVPB     1,750 mg 250 mL/hr over 120 Minutes Intravenous  Once 01/10/16 2128 01/11/16 0004   01/10/16 2100  vancomycin (VANCOCIN) IVPB 1000 mg/200 mL premix  Status:  Discontinued     1,000 mg 200 mL/hr over 60 Minutes Intravenous  Once 01/10/16 2043 01/10/16 2128   01/10/16 2045  ceFEPIme (MAXIPIME) 1 g in dextrose 5 % 50 mL IVPB  Status:  Discontinued     1 g 100 mL/hr over 30 Minutes Intravenous  Once 01/10/16 2034 01/10/16 2132      HPI/Subjective: Still a little dizzy but better  Objective: Filed Vitals:   01/17/16 2328 01/18/16 0510 01/18/16 0838 01/18/16 0910  BP:  97/64 106/68   Pulse:  58 60   Temp:  98.2 F (36.8 C) 98.2 F (36.8 C)   TempSrc:   Oral   Resp:  23 20   Height:      Weight: 81.647 kg (180 lb)     SpO2:  92% 92% 95%    Intake/Output Summary (Last 24 hours) at 01/18/16 1512 Last data filed at 01/18/16 0600  Gross per 24 hour  Intake    120 ml  Output      0 ml  Net    120 ml   Filed Weights   01/17/16 0650 01/17/16 1036 01/17/16 2328  Weight: 84.1 kg (185 lb 6.5 oz) 81.2 kg (179 lb 0.2 oz) 81.647 kg (180 lb)    Exam:   General:  AAOx3, no distress  Cardiovascular:S1S2/RRR  Respiratory: normal resp effort, no wheezing  Abdomen: soft, nondistended, pos BS  Musculoskeletal: perfused, no clubbing  Penis with small ulcer, very tender  Data Reviewed: Basic Metabolic Panel:  Recent Labs Lab 01/11/16 1850 01/12/16 1917 01/15/16 0449 01/16/16 1657 01/17/16 0635  NA 135 135 131* 133* 134*  K 4.4 4.2 5.5* 4.8 5.2*  CL 94* 96* 94* 96* 97*  CO2 23 27 24 27 24   GLUCOSE 78 121* 107* 147* 90  BUN 29* 42* 56* 38* 47*  CREATININE 5.23* 6.46* 8.56* 6.49* 7.17*  CALCIUM 9.6 9.5 9.5 9.3 9.5  PHOS 5.4* 5.3*  --   --   --    Liver Function Tests:  Recent Labs Lab 01/11/16 1850 01/12/16 1917  ALBUMIN 3.4* 3.1*    No results for input(s): LIPASE, AMYLASE in the last 168 hours. No results for input(s): AMMONIA in the last 168 hours. CBC:  Recent Labs Lab 01/12/16 1917 01/14/16 0557 01/15/16 0726 01/16/16 1657 01/17/16 0635  WBC 6.5 8.7 5.7 5.6 5.3  HGB 9.1* 9.9* 9.9* 9.3* 9.6*  HCT 29.0* 31.1* 29.2* 30.0* 31.2*  MCV 98.0 100.6* 98.3 99.3 99.7  PLT 119* 108* 105* 110* 116*   Cardiac Enzymes: No results for input(s): CKTOTAL, CKMB, CKMBINDEX, TROPONINI in the last 168 hours. BNP (last 3 results)  Recent Labs  01/10/16 2006  BNP 4360.3*    ProBNP (last 3 results) No  results for input(s): PROBNP in the last 8760 hours.  CBG:  Recent Labs Lab 01/16/16 1755 01/17/16 1212 01/17/16 1644 01/17/16 2200 01/18/16 1350  GLUCAP 135* 67 153* 111* 154*    Recent Results (from the past 240 hour(s))  Culture, blood (routine x 2) Call MD if unable to obtain prior to antibiotics being given     Status: None   Collection Time: 01/11/16 12:05 AM  Result Value Ref Range Status   Specimen Description BLOOD RIGHT FOREARM  Final   Special Requests BOTTLES DRAWN AEROBIC ONLY 7ML  Final   Culture NO GROWTH 5 DAYS  Final   Report Status 01/16/2016 FINAL  Final  Culture, blood (routine x 2) Call MD if unable to obtain prior to antibiotics being given     Status: None   Collection Time: 01/11/16 12:10 AM  Result Value Ref Range Status   Specimen Description BLOOD RIGHT HAND  Final   Special Requests BOTTLES DRAWN AEROBIC AND ANAEROBIC 5ML  Final   Culture NO GROWTH 5 DAYS  Final   Report Status 01/16/2016 FINAL  Final  Wound culture     Status: None   Collection Time: 01/11/16 12:40 AM  Result Value Ref Range Status   Specimen Description ULCER  Final   Special Requests PENIS  Final   Gram Stain   Final    NO WBC SEEN NO SQUAMOUS EPITHELIAL CELLS SEEN NO ORGANISMS SEEN Performed at [facility]    Culture   Final    NORMAL SKIN FLORA Performed at [facility]     Report Status 01/14/2016 FINAL  Final  Respiratory virus panel     Status: Abnormal   Collection Time: 01/11/16 12:40 AM  Result Value Ref Range Status   Respiratory Syncytial Virus A Negative Negative Final   Respiratory Syncytial Virus B Negative Negative Final   Influenza A Negative Negative Final   Influenza B Positive (A) Negative Final   Parainfluenza 1 Negative Negative Final   Parainfluenza 2 Negative Negative Final   Parainfluenza 3 Negative Negative Final   Metapneumovirus Negative Negative Final   Rhinovirus Negative Negative Final   Adenovirus Negative Negative Final    Comment: (NOTE) Performed At: [facility] [place], [place] [id number] Lindon Romp MD [id number]   MRSA PCR Screening     Status: None   Collection Time: 01/11/16 12:40 AM  Result Value Ref Range Status   MRSA by PCR NEGATIVE NEGATIVE Final    Comment:        The GeneXpert MRSA Assay (FDA approved for NASAL specimens only), is one component of a comprehensive MRSA colonization surveillance program. It is not intended to diagnose MRSA infection nor to guide or monitor treatment for MRSA infections.      Studies: Dg Chest 2 View  01/18/2016  CLINICAL DATA:  Dyspnea. Hypertension and diabetes. Coronary artery disease. Previous stroke. EXAM: CHEST  2 VIEW COMPARISON:  01/16/2016 FINDINGS: Right jugular central venous dialysis catheter and transvenous pacemaker remain in appropriate position. Mild cardiomegaly stable. Coronary artery calcification again noted. Small pleural effusions remain stable, right side greater than left. Bilateral lower lobe atelectasis or infiltrates also show no significant change. IMPRESSION: No significant change in bilateral pleural effusions and bilateral lower lobe atelectasis versus infiltrates, right side greater than left. Stable mild cardiomegaly. Electronically Signed   By: Earle Gell M.D.   On: 01/18/2016 10:31   Dg Chest Port 1  View  01/16/2016  CLINICAL  DATA:  Hypoxemia.  Shortness of breath for 1 day. EXAM: PORTABLE CHEST 1 VIEW COMPARISON:  01/10/2016 FINDINGS: Right jugular dual-lumen catheter remains in place, terminating over the lower SVC. Dual lead pacemaker remains in place. The cardiac silhouette remains enlarged, unchanged. Thoracic aortic and coronary artery atherosclerosis is again seen. Moderate right and small left pleural effusions have not significantly changed. There is persistent right basilar atelectasis or consolidation which has likely not significantly changed. There is slight central pulmonary vascular congestion without overt edema. No pneumothorax is seen. IMPRESSION: Persistent moderate right and small left pleural effusions with right basilar atelectasis or consolidation. Electronically Signed   By: Logan Bores M.D.   On: 01/16/2016 17:24    Scheduled Meds: . amiodarone  100 mg Oral Daily  . atorvastatin  40 mg Oral Daily  . cefUROXime  500 mg Oral Q24H  . clopidogrel  75 mg Oral Daily  . dextromethorphan-guaiFENesin  1 tablet Oral BID  . feeding supplement (NEPRO CARB STEADY)  237 mL Oral BID BM  . ferric gluconate (FERRLECIT/NULECIT) IV  62.5 mg Intravenous Q Thu-HD  . gabapentin  300 mg Oral QHS  . insulin aspart  0-5 Units Subcutaneous QHS  . insulin aspart  0-9 Units Subcutaneous TID WC  . lanthanum  1,000 mg Oral TID WC  . mometasone-formoterol  2 puff Inhalation BID  . multivitamin  1 tablet Oral QHS  . oseltamivir  30 mg Oral Q T,Th,Sat-1800  . sodium thiosulfate infusion for calciphylaxis  25 g Intravenous Q T,Th,Sa-HD  . warfarin  5 mg Oral ONCE-1800  . Warfarin - Pharmacist Dosing Inpatient   Does not apply q1800   Continuous Infusions:   Principal Problem:   HCAP (healthcare-associated pneumonia) Active Problems:   Elevated troponin   DM (diabetes mellitus), type 2, uncontrolled, with renal complications (HCC)   Essential hypertension, benign   Chronic combined  systolic and diastolic CHF (congestive heart failure) (HCC)   CAD (coronary artery disease)   PAF (paroxysmal atrial fibrillation) (HCC)   Atrial fibrillation (HCC)   HLD (hyperlipidemia)   ESRD (end stage renal disease) on dialysis ([place])   Ulcer of penis   Calcification of soft tissue    Zabria Liss  [facility] Pager [phone]. If 7PM-7AM, please contact night-coverage at [email or web address], password [facility] 01/18/2016, 3:12 PM  LOS: 8 days

## 2016-01-18 NOTE — Progress Notes (Signed)
Tony Freeman Progress Note  Assessment/Plan: 1. Hypotension - resolved with IVF 2. HCAP with R. Pleural Effusion: Per primary. Initially on Vanc/Zosyn. Changed to Ceftin 3   ESRD -TTS HD 4. Sec HPTH - use non-Ca binders and no vit D for now. PTH normal, no need for sensipar 5. HTN/volume - will need to raise dry wt, pt has gained body wt.  New edw around 81.5kg 6. Nutrition - albumin 3.1 Renal/Carb mod diet, renal vit/protein supplement.  7. Penile lesion/ painful/ necrotic / small: necrotic skin lesion approximately 1.5 cm diameter, painful to touch. poss early calciphylaxis. For now will use preliminary measures w low Ca bath and no Ca-based binders or vit D. If lesion gets bigger will refer for tissue biopsy. Lesion not big enough for biopsy at this time. Last PTH 95. Started sod thiosulfate here. 8. Afib: DM per primary. CHA2DS2-VASc Score 7. Coumadin per pharmacy INR 2.11 today.  9. DM: per primary 10. 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 Has ICD. Dual chamber pacemaker 11. Anemia - Hgb 9.6 OP ESA dose given 03/14. Follow HGB.  Plan - stable for dc from our standpoint today.  Raise dry wt by Onalee Hua MD [place] pager [phone]    cell [phone] 01/18/2016, 3:04 PM    Subjective: on Room air not hypoxic, looks better, sitting up feeding himself breakfast.  Will need to raise dry wt by 2 gk.   Objective Filed Vitals:   01/17/16 2328 01/18/16 0510 01/18/16 0838 01/18/16 0910  BP:  97/64 106/68   Pulse:  58 60   Temp:  98.2 F (36.8 C) 98.2 F (36.8 C)   TempSrc:   Oral   Resp:  23 20   Height:      Weight: 81.647 kg (180 lb)     SpO2:  92% 92% 95%   Physical Exam General: Well nourished, NAD Heart: S1, S2 II/VI systolic M  Lungs: Bilateral breath sounds, essentially clear at present. No WOB Abdomen: soft nontender, active BS Extremities: No LE edema Dialysis Access: R perm cath Drsg CDI nonmaturing LUA  AVF Dialysis Orders: [facility]    TTS 4.25 hr 400/800 EDW 79.5 2 K 2.25 Ca profile 4, heparin 2600 units venofer 50mg  3/16 Mircera 75 mcg q 2 weeks   Additional Objective Labs: Basic Metabolic Panel:  Recent Labs Lab 01/11/16 1850 01/12/16 1917 01/15/16 0449 01/16/16 1657 01/17/16 0635  NA 135 135 131* 133* 134*  K 4.4 4.2 5.5* 4.8 5.2*  CL 94* 96* 94* 96* 97*  CO2 23 27 24 27 24   GLUCOSE 78 121* 107* 147* 90  BUN 29* 42* 56* 38* 47*  CREATININE 5.23* 6.46* 8.56* 6.49* 7.17*  CALCIUM 9.6 9.5 9.5 9.3 9.5  PHOS 5.4* 5.3*  --   --   --    Liver Function Tests:  Recent Labs Lab 01/11/16 1850 01/12/16 1917  ALBUMIN 3.4* 3.1*   No results for input(s): LIPASE, AMYLASE in the last 168 hours. CBC:  Recent Labs Lab 01/12/16 1917 01/14/16 0557 01/15/16 0726 01/16/16 1657 01/17/16 0635  WBC 6.5 8.7 5.7 5.6 5.3  HGB 9.1* 9.9* 9.9* 9.3* 9.6*  HCT 29.0* 31.1* 29.2* 30.0* 31.2*  MCV 98.0 100.6* 98.3 99.3 99.7  PLT 119* 108* 105* 110* 116*   Blood Culture    Component Value Date/Time   SDES ULCER 01/11/2016 0040   SPECREQUEST PENIS 01/11/2016 0040   CULT  01/11/2016 0040    NORMAL  SKIN FLORA Performed at [place] 01/14/2016 FINAL 01/11/2016 0040    Cardiac Enzymes: No results for input(s): CKTOTAL, CKMB, CKMBINDEX, TROPONINI in the last 168 hours. CBG:  Recent Labs Lab 01/16/16 1755 01/17/16 1212 01/17/16 1644 01/17/16 2200 01/18/16 1350  GLUCAP 135* 67 153* 111* 154*   Iron Studies: No results for input(s): IRON, TIBC, TRANSFERRIN, FERRITIN in the last 72 hours. @lablastinr3 @ Studies/Results: Dg Chest 2 View  01/18/2016  CLINICAL DATA:  Dyspnea. Hypertension and diabetes. Coronary artery disease. Previous stroke. EXAM: CHEST  2 VIEW COMPARISON:  01/16/2016 FINDINGS: Right jugular central venous dialysis catheter and transvenous pacemaker remain in appropriate position. Mild cardiomegaly stable. Coronary artery calcification  again noted. Small pleural effusions remain stable, right side greater than left. Bilateral lower lobe atelectasis or infiltrates also show no significant change. IMPRESSION: No significant change in bilateral pleural effusions and bilateral lower lobe atelectasis versus infiltrates, right side greater than left. Stable mild cardiomegaly. Electronically Signed   By: Earle Gell M.D.   On: 01/18/2016 10:31   Dg Chest Port 1 View  01/16/2016  CLINICAL DATA:  Hypoxemia.  Shortness of breath for 1 day. EXAM: PORTABLE CHEST 1 VIEW COMPARISON:  01/10/2016 FINDINGS: Right jugular dual-lumen catheter remains in place, terminating over the lower SVC. Dual lead pacemaker remains in place. The cardiac silhouette remains enlarged, unchanged. Thoracic aortic and coronary artery atherosclerosis is again seen. Moderate right and small left pleural effusions have not significantly changed. There is persistent right basilar atelectasis or consolidation which has likely not significantly changed. There is slight central pulmonary vascular congestion without overt edema. No pneumothorax is seen. IMPRESSION: Persistent moderate right and small left pleural effusions with right basilar atelectasis or consolidation. Electronically Signed   By: Logan Bores M.D.   On: 01/16/2016 17:24   Medications:   . amiodarone  100 mg Oral Daily  . atorvastatin  40 mg Oral Daily  . cefUROXime  500 mg Oral Q24H  . clopidogrel  75 mg Oral Daily  . dextromethorphan-guaiFENesin  1 tablet Oral BID  . feeding supplement (NEPRO CARB STEADY)  237 mL Oral BID BM  . ferric gluconate (FERRLECIT/NULECIT) IV  62.5 mg Intravenous Q Thu-HD  . gabapentin  300 mg Oral QHS  . insulin aspart  0-5 Units Subcutaneous QHS  . insulin aspart  0-9 Units Subcutaneous TID WC  . lanthanum  1,000 mg Oral TID WC  . mometasone-formoterol  2 puff Inhalation BID  . multivitamin  1 tablet Oral QHS  . oseltamivir  30 mg Oral Q T,Th,Sat-1800  . sodium thiosulfate  infusion for calciphylaxis  25 g Intravenous Q T,Th,Sa-HD  . warfarin  5 mg Oral ONCE-1800  . Warfarin - Pharmacist Dosing Inpatient   Does not apply [phone]

## 2016-01-18 NOTE — Progress Notes (Signed)
ANTICOAGULATION CONSULT NOTE - Follow Up Consult  Pharmacy Consult for Coumadin Indication: atrial fibrillation  No Known Allergies  Patient Measurements: Height: 5\' 6"  (167.6 cm) Weight: 180 lb (81.647 kg) IBW/kg (Calculated) : 63.8  Vital Signs: Temp: 98.2 F (36.8 C) (03/24 0838) Temp Source: Oral (03/24 0838) BP: 106/68 mmHg (03/24 0838) Pulse Rate: 60 (03/24 0838)  Labs:  Recent Labs  01/16/16 0715 01/16/16 1657 01/17/16 0635 01/18/16 0632  HGB  --  9.3* 9.6*  --   HCT  --  30.0* 31.2*  --   PLT  --  110* 116*  --   LABPROT 26.8*  --  23.5* 22.4*  INR 2.51*  --  2.11* 1.98*  CREATININE  --  6.49* 7.17*  --     Estimated Creatinine Clearance: 10.6 mL/min (by C-G formula based on Cr of 7.17).  Assessment: 24 YOM who presented on 3/17 with SOB and penile ulcers. PTA the patient was on warfarin for hx Afib/CVA - pharmacy consulted to resume this admit. Per the coumadin clinic notes, the patient's INR goal of 2-2.5 on a PTA dose of 2.5 mg on TTSS and  5 mg on MWF.  INR is 1.98 today, within low target range after above goal for several days. Day # 8 antibiotics for HCAP, currently on Ceftin 500 mg daily at 6pm, which may have effected INR/Coumadin sensitivity.   Day #3 Tamiflu. Hgb 9.6, platelet count low stable, no bleeding noted.  Weekly IV iron given yesterday. Outpt Micera q2 wks given 3/14.   Goal of Therapy:  INR 2-2.5 Monitor platelets by anticoagulation protocol: Yes   Plan:   Coumadin 5 mg today, usual Friday dose.  Daily PT/INR.  Eudelia Bunch, Pharm.D. [id number] 01/18/2016 2:58 PM

## 2016-01-18 NOTE — Progress Notes (Signed)
Physical Therapy Treatment Patient Details Name: Tony Freeman MRN: [id number] DOB: [birth year] Today's Date: 01/18/2016    History of Present Illness Pt is a 65 y/o M who presented w/ SOB and a penial ulcer.  Pt's PMH includes ESRD, DM, CAD, s/p stent placement, AICD, CHF, dementia, PVD, stroke.HD  T, TH, SAT    PT Comments    Continues to improve, tolerating further distances. Assessed gait with cane and no assistive device (initially refusing RW) however pt still with intermittent loss of balance requiring min assist to steady. Gait with RW from previous session was appropriate to stabilize pt and he now agrees to use RW at home. SpO2 90% on room air, improves with seated rest break, no dyspnea noted. Will benefit from HHPT follow-up and supervision with mobility from son at home.  Follow Up Recommendations  Home health PT;Supervision for mobility/OOB (States son works from home and can care for pt.)     Clinical biochemist with 5" wheels    Recommendations for Other Services       Precautions / Restrictions Precautions Precautions: Fall Restrictions Weight Bearing Restrictions: No    Mobility  Bed Mobility               General bed mobility comments: sitting EOB  Transfers Overall transfer level: Needs assistance Equipment used: None   Sit to Stand: Min guard         General transfer comment: Min guard for safety. Mild sway noted. Reaches for furniture to lightly correct himself. Cues for safety and awareness. Declines to use RW.  Ambulation/Gait Ambulation/Gait assistance: Min assist Ambulation Distance (Feet): 200 Feet Assistive device: Straight cane;None Gait Pattern/deviations: Step-through pattern;Decreased stride length;Scissoring;Narrow base of support;Staggering right;Staggering left;Drifts right/left Gait velocity: reduced Gait velocity interpretation: Below normal speed for age/gender General Gait Details: Declined  to use RW today. Intermittent unsteadiness without assistive device requiring min assist at times, especially during dynamic gait tasks such as vertical/horizontal head turns and high marching. Moderately improved with introduction to a cane when agreeable however still with intermittent scissoring. Cues for safety and awareness throughout. Verbalizes awareness.   Stairs            Wheelchair Mobility    Modified Rankin (Stroke Patients Only)       Balance                                    Cognition Arousal/Alertness: Awake/alert Behavior During Therapy: WFL for tasks assessed/performed Overall Cognitive Status: Impaired/Different from baseline Area of Impairment: Problem solving             Problem Solving: Slow processing;Requires verbal cues      Exercises      General Comments General comments (skin integrity, edema, etc.): Discussed need for RW temporarily and follow HHPT. Pt agrees at end of session after trialing no device and cane which does not quite provide adequate support at this time.      Pertinent Vitals/Pain Pain Assessment: Faces Faces Pain Scale: Hurts little more Pain Location: Penis Pain Descriptors / Indicators: Tingling Pain Intervention(s): Monitored during session;Repositioned    Home Living                      Prior Function            PT Goals (current goals can now be found in the care plan  section) Acute Rehab PT Goals Patient Stated Goal: go home PT Goal Formulation: With patient Time For Goal Achievement: 01/29/16 Potential to Achieve Goals: Good Progress towards PT goals: Progressing toward goals    Frequency  Min 3X/week    PT Plan Current plan remains appropriate    Co-evaluation             End of Session Equipment Utilized During Treatment: Gait belt Activity Tolerance: Patient tolerated treatment well Patient left: in chair;with call bell/phone within reach     Time:  1419-1440 PT Time Calculation (min) (ACUTE ONLY): 21 min  Charges:  $Gait Training: 8-22 mins                    G Codes:      Tony Freeman 01/19/2016, 4:24 PM Tony Freeman [place], [place]

## 2016-01-19 DIAGNOSIS — I4891 Unspecified atrial fibrillation: Secondary | ICD-10-CM

## 2016-01-19 LAB — RENAL FUNCTION PANEL
Albumin: 3.2 g/dL — ABNORMAL LOW (ref 3.5–5.0)
Anion gap: 12 (ref 5–15)
BUN: 45 mg/dL — ABNORMAL HIGH (ref 6–20)
CALCIUM: 9.4 mg/dL (ref 8.9–10.3)
CO2: 26 mmol/L (ref 22–32)
CREATININE: 7.39 mg/dL — AB (ref 0.61–1.24)
Chloride: 94 mmol/L — ABNORMAL LOW (ref 101–111)
GFR calc non Af Amer: 7 mL/min — ABNORMAL LOW (ref >60)
GFR, EST AFRICAN AMERICAN: 8 mL/min — AB (ref >60)
Glucose, Bld: 116 mg/dL — ABNORMAL HIGH (ref 65–99)
Phosphorus: 5.5 mg/dL — ABNORMAL HIGH (ref 2.5–4.6)
Potassium: 4.7 mmol/L (ref 3.5–5.1)
SODIUM: 132 mmol/L — AB (ref 135–145)

## 2016-01-19 LAB — CBC
HCT: 31.1 % — ABNORMAL LOW (ref 39.0–52.0)
Hemoglobin: 10.2 g/dL — ABNORMAL LOW (ref 13.0–17.0)
MCH: 32.6 pg (ref 26.0–34.0)
MCHC: 32.8 g/dL (ref 30.0–36.0)
MCV: 99.4 fL (ref 78.0–100.0)
Platelets: 108 K/uL — ABNORMAL LOW (ref 150–400)
RBC: 3.13 MIL/uL — ABNORMAL LOW (ref 4.22–5.81)
RDW: 16.8 % — ABNORMAL HIGH (ref 11.5–15.5)
WBC: 6.2 10*3/uL (ref 4.0–10.5)

## 2016-01-19 LAB — GLUCOSE, CAPILLARY
GLUCOSE-CAPILLARY: 162 mg/dL — AB (ref 65–99)
GLUCOSE-CAPILLARY: 134 mg/dL — AB (ref 65–99)
Glucose-Capillary: 129 mg/dL — ABNORMAL HIGH (ref 65–99)

## 2016-01-19 LAB — PROTIME-INR
INR: 2.1 — AB (ref 0.00–1.49)
Prothrombin Time: 23.4 seconds — ABNORMAL HIGH (ref 11.6–15.2)

## 2016-01-19 MED ORDER — HEPARIN SODIUM (PORCINE) 1000 UNIT/ML DIALYSIS: 2600.0000 [IU] | Freq: Once | INTRAMUSCULAR | Status: DC

## 2016-01-19 MED ORDER — SODIUM CHLORIDE 0.9 % IV SOLN: 100.0000 mL | INTRAVENOUS | Status: DC | PRN

## 2016-01-19 MED ORDER — ALTEPLASE 2 MG IJ SOLR: 2.0000 mg | Freq: Once | INTRAMUSCULAR | Status: DC | PRN

## 2016-01-19 MED ORDER — LIDOCAINE-PRILOCAINE 2.5-2.5 % EX CREA: 1.0000 "application " | TOPICAL_CREAM | CUTANEOUS | Status: DC | PRN

## 2016-01-19 MED ORDER — WARFARIN SODIUM 2.5 MG PO TABS
2.5000 mg | ORAL_TABLET | Freq: Once | ORAL | Status: AC
  Administered 2016-01-19: 2.5 mg via ORAL
  Filled 2016-01-19: qty 1

## 2016-01-19 MED ORDER — HEPARIN SODIUM (PORCINE) 1000 UNIT/ML DIALYSIS: 1000.0000 [IU] | INTRAMUSCULAR | Status: DC | PRN

## 2016-01-19 MED ORDER — PENTAFLUOROPROP-TETRAFLUOROETH EX AERO: 1.0000 "application " | INHALATION_SPRAY | CUTANEOUS | Status: DC | PRN

## 2016-01-19 MED ORDER — LIDOCAINE HCL (PF) 1 % IJ SOLN: 5.0000 mL | INTRAMUSCULAR | Status: DC | PRN

## 2016-01-19 NOTE — Progress Notes (Signed)
[facility] Progress Note  Assessment/Plan: 1. Hypotension - resolved with IVF 2. HCAP with R. Pleural Effusion: Per primary. Initially on Vanc/Zosyn. Changed to Ceftin 3 ESRD -TTS HD. Will have HD in house today-had plans for DC yesterday but still here.  4. Sec HPTH - use non-Ca binders and no vit D for now. PTH normal, no need for sensipar 5. HTN/volume - will need to raise dry wt, pt has gained body wt. New edw around 81.5kg 6. Nutrition - albumin 3.1 Renal/Carb mod diet, renal vit/protein supplement.  7. Penile lesion/ painful/ necrotic / small: necrotic skin lesion approximately 1.5 cm diameter, painful to touch. poss early calciphylaxis. Patient reports no change. For now will use preliminary measures w low Ca bath and no Ca-based binders or vit D. If lesion gets bigger will refer for tissue biopsy. Lesion not big enough for biopsy at this time. Last PTH 95. Started sod thiosulfate here.  8. Afib: DM per primary. CHA2DS2-VASc Score 7. Coumadin per pharmacy INR 2.10 today.  9. DM: per primary 10. 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 Has ICD. Dual chamber pacemaker 11. Anemia - Hgb 9.6 OP ESA dose given 03/14. Recheck in HD today   [name] NP-C 01/19/2016, 9:49 AM  [facility] [phone]  Pt seen, examined and agree w A/P as above.  Kelly Splinter MD [facility] pager [phone]    cell [phone] 01/19/2016, 2:49 PM    Subjective: "I feel fine today, but when am I getting my dialysis?" Denies dizziness, BP stable. Denies SOB.    Objective Filed Vitals:   01/18/16 2056 01/18/16 2208 01/19/16 0427 01/19/16 0930  BP:  111/69 108/70   Pulse:  66 61   Temp:  98.6 F (37 C) 98.3 F (36.8 C)   TempSrc:      Resp:  20    Height:      Weight:  84.823 kg (187 lb)    SpO2: 99% 95% 95% 91%   Physical Exam General: Well nourished, NAD Heart: S1, S2 II/VI systolic M  Lungs: Bilateral breath sounds, few coarse  breath sounds upper lung fields, otherwise, CTA.  No WOB Abdomen: soft nontender, active BS Extremities: No LE edema GU: Deferred Dialysis Access: R perm cath Drsg CDI nonmaturing LUA AVF Dialysis Orders: [facility]    TTS 4.25 hr 400/800 EDW 79.5 2 K 2.25 Ca profile 4, heparin 2600 units venofer 50mg  3/16 Mircera 75 mcg q 2 weeks   Additional Objective Labs: Basic Metabolic Panel:  Recent Labs Lab 01/12/16 1917 01/15/16 0449 01/16/16 1657 01/17/16 0635  NA 135 131* 133* 134*  K 4.2 5.5* 4.8 5.2*  CL 96* 94* 96* 97*  CO2 27 24 27 24   GLUCOSE 121* 107* 147* 90  BUN 42* 56* 38* 47*  CREATININE 6.46* 8.56* 6.49* 7.17*  CALCIUM 9.5 9.5 9.3 9.5  PHOS 5.3*  --   --   --    Liver Function Tests:  Recent Labs Lab 01/12/16 1917  ALBUMIN 3.1*   No results for input(s): LIPASE, AMYLASE in the last 168 hours. CBC:  Recent Labs Lab 01/12/16 1917 01/14/16 0557 01/15/16 0726 01/16/16 1657 01/17/16 0635  WBC 6.5 8.7 5.7 5.6 5.3  HGB 9.1* 9.9* 9.9* 9.3* 9.6*  HCT 29.0* 31.1* 29.2* 30.0* 31.2*  MCV 98.0 100.6* 98.3 99.3 99.7  PLT 119* 108* 105* 110* 116*   Blood Culture    Component Value Date/Time   SDES ULCER 01/11/2016 0040  SPECREQUEST PENIS 01/11/2016 0040   CULT  01/11/2016 0040    NORMAL SKIN FLORA Performed at [place] 01/14/2016 FINAL 01/11/2016 0040    Cardiac Enzymes: No results for input(s): CKTOTAL, CKMB, CKMBINDEX, TROPONINI in the last 168 hours. CBG:  Recent Labs Lab 01/17/16 2200 01/18/16 1350 01/18/16 1625 01/18/16 2205 01/19/16 0757  GLUCAP 111* 154* 184* 112* 162*   Iron Studies: No results for input(s): IRON, TIBC, TRANSFERRIN, FERRITIN in the last 72 hours. @lablastinr3 @ Studies/Results: Dg Chest 2 View  01/18/2016  CLINICAL DATA:  Dyspnea. Hypertension and diabetes. Coronary artery disease. Previous stroke. EXAM: CHEST  2 VIEW COMPARISON:  01/16/2016 FINDINGS: Right jugular central venous dialysis catheter  and transvenous pacemaker remain in appropriate position. Mild cardiomegaly stable. Coronary artery calcification again noted. Small pleural effusions remain stable, right side greater than left. Bilateral lower lobe atelectasis or infiltrates also show no significant change. IMPRESSION: No significant change in bilateral pleural effusions and bilateral lower lobe atelectasis versus infiltrates, right side greater than left. Stable mild cardiomegaly. Electronically Signed   By: Earle Gell M.D.   On: 01/18/2016 10:31   Medications:   . amiodarone  100 mg Oral Daily  . atorvastatin  40 mg Oral Daily  . cefUROXime  500 mg Oral Q24H  . clopidogrel  75 mg Oral Daily  . dextromethorphan-guaiFENesin  1 tablet Oral BID  . feeding supplement (NEPRO CARB STEADY)  237 mL Oral BID BM  . ferric gluconate (FERRLECIT/NULECIT) IV  62.5 mg Intravenous Q Thu-HD  . gabapentin  300 mg Oral QHS  . insulin aspart  0-5 Units Subcutaneous QHS  . insulin aspart  0-9 Units Subcutaneous TID WC  . lanthanum  1,000 mg Oral TID WC  . mometasone-formoterol  2 puff Inhalation BID  . multivitamin  1 tablet Oral QHS  . oseltamivir  30 mg Oral Q T,Th,Sat-1800  . sodium thiosulfate infusion for calciphylaxis  25 g Intravenous Q T,Th,Sa-HD  . Warfarin - Pharmacist Dosing Inpatient   Does not apply [phone]

## 2016-01-19 NOTE — Progress Notes (Signed)
Since 3/24 pt has not required O2 and sats have remained >90%. Will continue to monitor.

## 2016-01-19 NOTE — Care Management Note (Signed)
Case Management Note  Patient Details  Name: Ahmire Seepersad MRN: [id number] Date of Birth: 21-Oct-1951  Subjective/Objective:                  HCAP (healthcare-associated pneumonia) Action/Plan: Discharge planning Expected Discharge Date:  01/19/16               Expected Discharge Plan:  [place]  In-House Referral:     Discharge planning Services  CM Consult  Post Acute Care Choice:  Home Health Choice offered to:  Patient  DME Arranged:  Walker rolling DME Agency:  [place]:  PT [facility] Agency:  [place]  Status of Service:  Completed, signed off  Medicare Important Message Given:  Yes Date Medicare IM Given:    Medicare IM give by:    Date Additional Medicare IM Given:    Additional Medicare Important Message give by:     If discussed at [place] of Stay Meetings, dates discussed:    Additional Comments: CM spoke with pt for choice of home health agency. Pt chooses [facility] to render HHPT.  Referral called to [facility] rep, Tiffany.  CM called [facility] DME rep, Merry Proud to please deliver the rolling walker to room so pt can discharge.  No other CM needs were communicated. Dellie Catholic, RN 01/19/2016, 4:26 PM

## 2016-01-19 NOTE — Progress Notes (Signed)
ANTICOAGULATION CONSULT NOTE - Follow Up Consult  Pharmacy Consult for Coumadin Indication: atrial fibrillation  No Known Allergies  Patient Measurements: Height: 5\' 6"  (167.6 cm) Weight: 187 lb (84.823 kg) IBW/kg (Calculated) : 63.8  Vital Signs: Temp: 98.6 F (37 C) (03/25 1000) Temp Source: Oral (03/25 1000) BP: 123/74 mmHg (03/25 1000) Pulse Rate: 60 (03/25 1000)  Labs:  Recent Labs  01/16/16 1657 01/17/16 0635 01/18/16 0632 01/19/16 0628  HGB 9.3* 9.6*  --   --   HCT 30.0* 31.2*  --   --   PLT 110* 116*  --   --   LABPROT  --  23.5* 22.4* 23.4*  INR  --  2.11* 1.98* 2.10*  CREATININE 6.49* 7.17*  --   --     Estimated Creatinine Clearance: 10.8 mL/min (by C-G formula based on Cr of 7.17).  Assessment: 14 YOM who presented on 3/17 with SOB and penile ulcers. Warfarin resumed from PTA for hx Afib - INR goal 2-2.5 per coumadin clinic note. CHADS-Vasc = 7. INR therapeutic today at 2.10. Last CBC 3/23: Hgb 9.6 and stable, plt 116 and stable. No reported bleeding.    DDI: Amiodarone   PTA dose is 2.5 mg TTSS and 5 mg on MWF  Goal of Therapy:  INR 2-2.5 Monitor platelets by anticoagulation protocol: Yes   Plan:  -Warfarin 2.5 mg x 1 today - per PTA regimen  -Daily PT/INR. _Monitor CBC, s/sx bleeding  Stephens November, Pharm.D. Clinical Pharmacy Resident 01/19/2016 11:31 AM

## 2016-01-19 NOTE — Discharge Summary (Signed)
Physician Discharge Summary  Rontez Phillippi Genther [id number] DOB: May 22, 1951 DOA: 01/10/2016  PCP: Tanya Nones, MD  Admit date: 01/10/2016 Discharge date: 01/19/2016  Time spent: 45 minutes  Recommendations for Outpatient Follow-up:  1. Dr.Perrottt in 1 week, needs CXR at FU 2. Coreg and cozaar stopped due to bradycardia and hypotension, re-assess upon FU   Discharge Diagnoses:  Principal Problem:   HCAP (healthcare-associated pneumonia) Active Problems:   Elevated troponin   DM (diabetes mellitus), type 2, uncontrolled, with renal complications (HCC)   Essential hypertension, benign   Chronic combined systolic and diastolic CHF (congestive heart failure) (HCC)   CAD (coronary artery disease)   PAF (paroxysmal atrial fibrillation) (HCC)   Atrial fibrillation (HCC)   HLD (hyperlipidemia)   ESRD (end stage renal disease) on dialysis ([place])   Ulcer of penis   Calcification of soft tissue   Discharge Condition: stable  Diet recommendation: Renal/diabetic  Filed Weights   01/17/16 1036 01/17/16 2328 01/18/16 2208  Weight: 81.2 kg (179 lb 0.2 oz) 81.647 kg (180 lb) 84.823 kg (187 lb)    History of present illness:  Chief Complaint: Shortness of breath, ulcer over penis  HPI: Tony Freeman is a 65 y.o. male with PMH of ESRD-HD, hypertension, hyperlipidemia, diabetes mellitus, CAD, S/P stent placement, AICD, chronic systolic and diastolic congestive heart failure with EF 15-20 percent, dementia, PAF on Coumadin, PVD, stroke, who presented with shortness of breath and penis ulcer. Patient reported having shortness of breath and cough ion the past 2 weeks. He coughs up greenish colored sputum. He does not have chest pain, fever or chills. He also reports having an ulcer in his penis. He states that he noticed a blister over penis glans a week ago, which has broken and formed a ulcer. It is painful. He does not have penile discharge or symptoms of UTI.  Hospital  Course:  Influenza B/Pneumonia with right-sided pleural effusion -was on vancomycin and cefepime, x2days -Blood cultures were negative, Urine for strep pneumonia antigen-negative. Resp virus panel positive for Flu B even though Influenza PCR was negative, started on tamiflu and completed course of Tamiflu today -Symptomatically improving, Abx changed to PO ceftin on 3/20. 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 RPR is nonreactive., no vesicles to suggest herpes -d/w Renal and he was given Na-thiosulphate with HD, some improvement noted  History of coronary artery disease with mildly elevated troponin Stable. Patient denies any chest pain. Mildly elevated troponin, likely due to renal failure. No need for further workup at this time.  DM-II Continue SSI. Monitor CBGs.  Essential Hypertension -3/22 had episode of hypotension, and bradycardia -stopped Coreg, Cozaar -Bm better, but still soft and HR remained in 55-60 range of Coreg  Chronic combined systolic and diastolic CHF (congestive heart failure)  2-D echo on 11/29/15 showed EF 15-20 percent. Patient has AICD placement. No leg edema. Volume is managed by HD - Continue antiplatelet agent.  Atrial Fibrillation CHA2DS2-VASc Score is 7 -Continue amiodarone, Coumadin  - coreg stopped due to bradycardia, hypotension and dizziness  HLD Continue home medications: Lipitor  ESRD-HD (TTS) Nephrology is following. 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 -continue neurontin, PT following -will need supervision per PT   Consultations:  REnal  Discharge Exam: Filed Vitals:   01/19/16 0427 01/19/16 1000  BP: 108/70 123/74  Pulse: 61 60  Temp:  98.3 F (36.8 C) 98.6 F (37 C)  Resp:  20    General: AAOx3 Cardiovascular: S1S2/RRR Respiratory: CTAB  Discharge  Instructions   Discharge Instructions    Discharge instructions    Complete by:  As directed   Renal diet     Increase activity slowly    Complete by:  As directed           Current Discharge Medication List    START taking these medications   Details  cefUROXime (CEFTIN) 500 MG tablet Take 1 tablet (500 mg total) by mouth daily. Qty: 4 tablet, Refills: 0    gabapentin (NEURONTIN) 300 MG capsule Take 1 capsule (300 mg total) by mouth at bedtime. Qty: 30 capsule, Refills: 0      CONTINUE these medications which have NOT CHANGED   Details  acetaminophen (TYLENOL) 325 MG tablet Take 650 mg by mouth every 8 (eight) hours as needed for headache (pain).     albuterol (PROVENTIL HFA;VENTOLIN HFA) 108 (90 BASE) MCG/ACT inhaler Inhale 2 puffs into the lungs every 6 (six) hours as needed for wheezing or shortness of breath.    albuterol (PROVENTIL) (2.5 MG/3ML) 0.083% nebulizer solution Take 2.5 mg by nebulization every 6 (six) hours as needed for wheezing or shortness of breath.    amiodarone (PACERONE) 200 MG tablet Take 100 mg by mouth daily.     atorvastatin (LIPITOR) 40 MG tablet Take 1 tablet (40 mg total) by mouth daily. Qty: 30 tablet, Refills: 1   Associated Diagnoses: Cardiomyopathy, ischemic    clopidogrel (PLAVIX) 75 MG tablet TAKE ONE TABLET BY MOUTH ONCE DAILY Qty: 30 tablet, Refills: 0    docusate sodium (COLACE) 100 MG capsule Take 100 mg by mouth daily as needed (constipation).     mometasone-formoterol (DULERA) 200-5 MCG/ACT AERO Inhale 2 puffs into the lungs 2 (two) times daily as needed for wheezing or shortness of breath.    Multiple Vitamin (MULTIVITAMIN WITH MINERALS) TABS tablet Take 1 tablet by mouth daily.    nitroGLYCERIN (NITROSTAT) 0.4 MG SL tablet Place 1 tablet (0.4 mg total) under the tongue every 5 (five) minutes as needed for chest pain. Qty: 30 tablet, Refills: 1    nystatin ointment (MYCOSTATIN) Apply 1 application topically as needed.     warfarin (COUMADIN) 5 MG tablet Take 2.5-5 mg by mouth as directed. Take 5 mg (1 tablet) on Mon, Wed, Fri and 2.5 mg (1/2 tablet) on Tues, Thurs, Sat, Sun    enoxaparin (LOVENOX) 80 MG/0.8ML injection Inject 0.8 mLs (80 mg total) into the skin daily. Qty: 10 Syringe, Refills: 0    insulin lispro (HUMALOG) 100 UNIT/ML injection Inject 10 Units into the skin 2 (two) times daily as needed for high blood sugar (CBG >180). Reported on 01/10/2016      STOP taking these medications     carvedilol (COREG) 6.25 MG tablet      losartan (COZAAR) 25 MG tablet        No Known Allergies Follow-up Information    Follow up with PERROTT, Renato Gails, MD. Schedule an appointment as soon as possible for a visit in 2 weeks.   Specialty:  Family Medicine   Why:  Hospital follow up, in 1 week   Contact information:   [place] [id number] [phone]        The results of significant diagnostics from this hospitalization (including imaging, microbiology, ancillary and laboratory) are listed below for reference.    Significant  Diagnostic Studies: Dg Chest 2 View  01/18/2016  CLINICAL DATA:  Dyspnea. Hypertension and diabetes. Coronary artery disease. Previous stroke. EXAM: CHEST  2 VIEW COMPARISON:  01/16/2016 FINDINGS: Right jugular central venous dialysis catheter and transvenous pacemaker remain in appropriate position. Mild cardiomegaly stable. Coronary artery calcification again noted. Small pleural effusions remain stable, right side greater than left. Bilateral lower lobe atelectasis or infiltrates also show no significant change. IMPRESSION: No significant change in bilateral pleural effusions and bilateral lower lobe atelectasis versus infiltrates, right side greater than left. Stable mild cardiomegaly. Electronically Signed   By: Earle Gell M.D.   On: 01/18/2016 10:31   Dg Chest 2 View  01/10/2016  CLINICAL DATA:  Shortness of breath EXAM: CHEST  2 VIEW COMPARISON:   March 30 26 FINDINGS: The heart size and mediastinal contours are stable. Heart size is enlarged. Cardiac pacemaker is unchanged. Dual lumen right central venous line are noted with distal tips in the superior vena cava. There is a moderate right pleural effusion with consolidation of right lung base. There is a small left pleural effusion. The visualized skeletal structures are stable. IMPRESSION: Moderate right pleural effusion with consolidation right lung base, underlying pneumonia is not excluded. Small left pleural effusion. Electronically Signed   By: Abelardo Diesel M.D.   On: 01/10/2016 19:22   Dg Pelvis 1-2 Views  01/11/2016  CLINICAL DATA:  Soft tissue ulcerations EXAM: PELVIS - 1-2 VIEW COMPARISON:  None. FINDINGS: Bony structures are within normal limits. Diffuse vascular calcifications are seen. No other soft tissue abnormality is noted. IMPRESSION: No acute abnormality seen. Electronically Signed   By: Inez Catalina M.D.   On: 01/11/2016 20:58   Dg Chest Port 1 View  01/16/2016  CLINICAL DATA:  Hypoxemia.  Shortness of breath for 1 day. EXAM: PORTABLE CHEST 1 VIEW COMPARISON:  01/10/2016 FINDINGS: Right jugular dual-lumen catheter remains in place, terminating over the lower SVC. Dual lead pacemaker remains in place. The cardiac silhouette remains enlarged, unchanged. Thoracic aortic and coronary artery atherosclerosis is again seen. Moderate right and small left pleural effusions have not significantly changed. There is persistent right basilar atelectasis or consolidation which has likely not significantly changed. There is slight central pulmonary vascular congestion without overt edema. No pneumothorax is seen. IMPRESSION: Persistent moderate right and small left pleural effusions with right basilar atelectasis or consolidation. Electronically Signed   By: Logan Bores M.D.   On: 01/16/2016 17:24    Microbiology: Recent Results (from the past 240 hour(s))  Culture, blood (routine x 2) Call  MD if unable to obtain prior to antibiotics being given     Status: None   Collection Time: 01/11/16 12:05 AM  Result Value Ref Range Status   Specimen Description BLOOD RIGHT FOREARM  Final   Special Requests BOTTLES DRAWN AEROBIC ONLY 7ML  Final   Culture NO GROWTH 5 DAYS  Final   Report Status 01/16/2016 FINAL  Final  Culture, blood (routine x 2) Call MD if unable to obtain prior to antibiotics being given     Status: None   Collection Time: 01/11/16 12:10 AM  Result Value Ref Range Status   Specimen Description BLOOD RIGHT HAND  Final   Special Requests BOTTLES DRAWN AEROBIC AND ANAEROBIC 5ML  Final   Culture NO GROWTH 5 DAYS  Final   Report Status 01/16/2016 FINAL  Final  Wound culture     Status: None   Collection Time: 01/11/16 12:40 AM  Result Value Ref Range Status  Specimen Description ULCER  Final   Special Requests PENIS  Final   Gram Stain   Final    NO WBC SEEN NO SQUAMOUS EPITHELIAL CELLS SEEN NO ORGANISMS SEEN Performed at [facility]    Culture   Final    NORMAL SKIN FLORA Performed at [facility]    Report Status 01/14/2016 FINAL  Final  Respiratory virus panel     Status: Abnormal   Collection Time: 01/11/16 12:40 AM  Result Value Ref Range Status   Respiratory Syncytial Virus A Negative Negative Final   Respiratory Syncytial Virus B Negative Negative Final   Influenza A Negative Negative Final   Influenza B Positive (A) Negative Final   Parainfluenza 1 Negative Negative Final   Parainfluenza 2 Negative Negative Final   Parainfluenza 3 Negative Negative Final   Metapneumovirus Negative Negative Final   Rhinovirus Negative Negative Final   Adenovirus Negative Negative Final    Comment: (NOTE) Performed At: [facility] [place], [place] [id number] Lindon Romp MD [id number]   MRSA PCR Screening     Status: None   Collection Time: 01/11/16 12:40 AM  Result Value Ref Range Status   MRSA by PCR NEGATIVE  NEGATIVE Final    Comment:        The GeneXpert MRSA Assay (FDA approved for NASAL specimens only), is one component of a comprehensive MRSA colonization surveillance program. It is not intended to diagnose MRSA infection nor to guide or monitor treatment for MRSA infections.      Labs: Basic Metabolic Panel:  Recent Labs Lab 01/12/16 1917 01/15/16 0449 01/16/16 1657 01/17/16 0635  NA 135 131* 133* 134*  K 4.2 5.5* 4.8 5.2*  CL 96* 94* 96* 97*  CO2 27 24 27 24   GLUCOSE 121* 107* 147* 90  BUN 42* 56* 38* 47*  CREATININE 6.46* 8.56* 6.49* 7.17*  CALCIUM 9.5 9.5 9.3 9.5  PHOS 5.3*  --   --   --    Liver Function Tests:  Recent Labs Lab 01/12/16 1917  ALBUMIN 3.1*   No results for input(s): LIPASE, AMYLASE in the last 168 hours. No results for input(s): AMMONIA in the last 168 hours. CBC:  Recent Labs Lab 01/12/16 1917 01/14/16 0557 01/15/16 0726 01/16/16 1657 01/17/16 0635  WBC 6.5 8.7 5.7 5.6 5.3  HGB 9.1* 9.9* 9.9* 9.3* 9.6*  HCT 29.0* 31.1* 29.2* 30.0* 31.2*  MCV 98.0 100.6* 98.3 99.3 99.7  PLT 119* 108* 105* 110* 116*   Cardiac Enzymes: No results for input(s): CKTOTAL, CKMB, CKMBINDEX, TROPONINI in the last 168 hours. BNP: BNP (last 3 results)  Recent Labs  01/10/16 2006  BNP 4360.3*    ProBNP (last 3 results) No results for input(s): PROBNP in the last 8760 hours.  CBG:  Recent Labs Lab 01/17/16 2200 01/18/16 1350 01/18/16 1625 01/18/16 2205 01/19/16 0757  GLUCAP 111* 154* 184* 112* 162*       SignedDomenic Polite MD.  [facility] 01/19/2016, 11:44 AM

## 2016-01-20 ENCOUNTER — Other Ambulatory Visit: Payer: Self-pay | Admitting: Adult Health

## 2016-01-22 ENCOUNTER — Telehealth: Payer: Self-pay | Admitting: *Deleted

## 2016-01-22 ENCOUNTER — Encounter: Payer: Medicare HMO | Admitting: *Deleted

## 2016-01-22 NOTE — Telephone Encounter (Signed)
Called patient to inquire about ICD care- [facility] was to release home remote monitoring to [place] but the patient was seen in their office 12/28/15. The patient reports that he was just "going with the flow" and if someone calls him about an appt he just goes. He does want to be seen by Dr. Curt Bears in [place] for routine care of his defibrillator. [facility] to release HM to our device clinic.

## 2016-01-23 ENCOUNTER — Ambulatory Visit (INDEPENDENT_AMBULATORY_CARE_PROVIDER_SITE_OTHER): Payer: Medicare HMO | Admitting: *Deleted

## 2016-01-23 DIAGNOSIS — I4891 Unspecified atrial fibrillation: Secondary | ICD-10-CM

## 2016-01-23 DIAGNOSIS — Z5181 Encounter for therapeutic drug level monitoring: Secondary | ICD-10-CM | POA: Diagnosis not present

## 2016-01-23 LAB — POCT INR: INR: 2.1

## 2016-01-23 MED ORDER — ENOXAPARIN SODIUM 80 MG/0.8ML ~~LOC~~ SOLN: 80.0000 mg | SUBCUTANEOUS | Status: DC

## 2016-01-23 NOTE — Patient Instructions (Addendum)
02/06/16- Last dose of Coumadin  02/07/16-No coumadin or Lovenox   02/08/16-  Inject Lovenox 80 mg in the fatty abdominal tissue at least 2 inches from the belly button once a day about 24 hours apart. Do inject at 6pm.  Rotate sites. No Coumadin  02/09/16-  Inject Lovenox 80mg  in the fatty abdominal  tissue at 6pm.  No Coumadin  02/10/16- Inject Lovenox in the fatty tissue at 6pm.  No Coumadin  02/11/16-NO  Lovenox.  No Coumadin  02/12/16-  Procedure Day - No Lovenox - Resume Coumadin in the evening or as directed by doctor (take an extra half tablet with usual dose for 2 days then resume normal dose)  02/13/16- Resume Lovenox injection 80mg s in the fatty tissue at 6pm and take coumadin   02/14/16- Continue Lovenox injection 80mg s everyday at 6pm into fatty abdominal tissue, rotate sites. Take Coumadin.   02/15/16-  Continue Lovenox injection 80mg s everyday at 6pm into fatty abdominal tissue, rotate sites. Take Coumadin.  02/16/16- Continue Lovenox injection 80mg s everyday at 6pm into fatty abdominal tissue, rotate sites. Take Coumadin.  02/17/16- Continue Lovenox injection 80mg s everyday at 6pm into fatty abdominal tissue, rotate sites. Take Coumadin.  02/18/16- Come in for Coumadin Clinic appointment

## 2016-01-25 ENCOUNTER — Encounter: Payer: Self-pay | Admitting: Cardiology

## 2016-01-26 ENCOUNTER — Other Ambulatory Visit: Payer: Self-pay | Admitting: Adult Health

## 2016-01-26 ENCOUNTER — Other Ambulatory Visit: Payer: Self-pay | Admitting: Cardiology

## 2016-01-29 ENCOUNTER — Other Ambulatory Visit: Payer: Self-pay | Admitting: Adult Health

## 2016-02-04 ENCOUNTER — Other Ambulatory Visit (HOSPITAL_COMMUNITY): Payer: Self-pay | Admitting: Family Medicine

## 2016-02-04 DIAGNOSIS — J90 Pleural effusion, not elsewhere classified: Secondary | ICD-10-CM

## 2016-02-05 ENCOUNTER — Ambulatory Visit (HOSPITAL_COMMUNITY): Payer: Medicare HMO

## 2016-02-05 ENCOUNTER — Ambulatory Visit (HOSPITAL_COMMUNITY)
Admission: RE | Admit: 2016-02-05 | Discharge: 2016-02-05 | Disposition: A | Discharge status: Home / Self Care | Payer: Medicare HMO | Source: Ambulatory Visit | Attending: Family Medicine | Admitting: Family Medicine

## 2016-02-05 ENCOUNTER — Encounter (HOSPITAL_COMMUNITY): Payer: Self-pay

## 2016-02-05 ENCOUNTER — Telehealth: Payer: Self-pay

## 2016-02-05 ENCOUNTER — Other Ambulatory Visit: Payer: Self-pay

## 2016-02-05 DIAGNOSIS — J90 Pleural effusion, not elsewhere classified: Secondary | ICD-10-CM | POA: Diagnosis not present

## 2016-02-05 DIAGNOSIS — J9811 Atelectasis: Secondary | ICD-10-CM | POA: Diagnosis not present

## 2016-02-05 NOTE — Telephone Encounter (Signed)
Notified Lurline Idol @ [facility] re: scheduled procedure for Insertion of Right Arm AVF vs BVT vs Insertion of Right Arm AVG on 4/18, and need for pt. to hold Coumadin 3 days prior; last dose to be taken 02/08/16, then hold until after surgery.  Will fax pre-op instructions to the kidney center; Lurline Idol will give to the pt.

## 2016-02-05 NOTE — Telephone Encounter (Signed)
-----   Message from Angelia Mould, MD sent at 02/05/2016  7:26 AM EDT ----- Regarding: RE: Recommendation for holding Coumadin Min would be 3 days. Thanks Gerald Stabs  ----- Message -----    From: Denman George, RN    Sent: 02/04/2016   3:25 PM      To: Angelia Mould, MD Subject: Recommendation for holding Coumadin            This pt. Is scheduled for Right Arm AVF vs BVT vs AVG.  He is on Coumadin, and had a cardioversion 01/02/16; per Dr. Aundra Dubin Lovenox bridging is difficult with ESRD, and recommends to minimize the time off Coumadin; how many days pre-op, should I have him hold the Coumadin?

## 2016-02-06 ENCOUNTER — Encounter: Payer: Medicare HMO | Admitting: *Deleted

## 2016-02-06 ENCOUNTER — Telehealth (HOSPITAL_COMMUNITY): Payer: Self-pay | Admitting: *Deleted

## 2016-02-06 ENCOUNTER — Other Ambulatory Visit (HOSPITAL_COMMUNITY): Payer: Self-pay | Admitting: *Deleted

## 2016-02-06 MED ORDER — AMIODARONE HCL 200 MG PO TABS: 100.0000 mg | ORAL_TABLET | Freq: Every day | ORAL | Status: AC

## 2016-02-06 NOTE — Telephone Encounter (Signed)
[facility] called pt has completed home physical therapy. She said he may start cardiopulmonary rehab at the hospital.

## 2016-02-08 NOTE — Telephone Encounter (Signed)
Angela Nevin, pt's care manager also called today requesting referral for cardiac rehab for patient.  Told her note was sent to MD on 4/12 to address and send referral.  She would like a call once the referral has been placed.  Benay [phone]

## 2016-02-10 NOTE — Telephone Encounter (Signed)
Please refer him to cardiac rehab

## 2016-02-11 ENCOUNTER — Encounter (HOSPITAL_COMMUNITY): Payer: Self-pay | Admitting: *Deleted

## 2016-02-11 ENCOUNTER — Encounter: Payer: Self-pay | Admitting: Cardiology

## 2016-02-11 MED ORDER — DEXTROSE 5 % IV SOLN: 1.5000 g | INTRAVENOUS | Status: DC

## 2016-02-11 MED ORDER — SODIUM CHLORIDE 0.9 % IV SOLN: INTRAVENOUS | Status: DC

## 2016-02-11 NOTE — Telephone Encounter (Signed)
Spoke w/cardiac rehab, pt has completed phase I and they have been waiting on Dubach to sign off to start him in phase II, advised HH was done w/pt, they will contact him to schedule, no new order is needed, left mess for [facility]

## 2016-02-11 NOTE — Progress Notes (Signed)
Pt has CAD and has an ICD, followed by Dr. Aundra Dubin. Pt denies any recent chest pain or sob. Perioperative Rx for Implanted Cardiac Device Programming sent to Dr. Aundra Dubin. Pt is diabetic. States his fasting blood sugar usually runs in the low 100's. States he rarely has to use his Humalog insulin.  Pt has dialysis at 11:00 AM, asking if he will be missing it. I told him that he may be done before so he can get there, but that decision will be made tomorrow. He voiced understanding.

## 2016-02-11 NOTE — Progress Notes (Signed)
Anesthesia Chart Review:  Pt is a 65 year old male scheduled for R AV fistula creation vs basilic vein transposition vs AV gortex graft on 02/12/2016 with Dr. Scot Dock.   Pt is a same day work up.   PCP is Dr. Rico Junker (care everywhere). Nephrologist is Dr. Florene Glen. Cardiologist is Dr. [name].   PMH includes:  CAD (a. PCI LAD/RCA in 2007 b. PCI LAD/RCA in 2009 c. PCI LAD 08/2013, CTO of RCA and nonobstructive disease in the LCx d. balloon angioplasty for severe re-instent restenosis of LAD stents 05/18/15), PEA arrest (05/05/15 cardiac arrest [place], CPR started, AED pulseless wide complex PEA, ROSC after 20 minutes of ACLS by EMS; transferred to [facility] with [facility] airway and cooling; no ICD shocks on interrogation- see care everywhere), systolic and diastolic CHF, ischemic cardiomyopathy, AICD (Biotronik ICD 06/2014), atrial fibrillation, atrial flutter, HTN, stroke, PVD, anemia, asthma, DM, ESRD on HD, dementia. Never smoker. BMI 29.5. S/p cardioversion 01/02/16  Hospitalized 3/16-3/25/17 for HCAP.  Medications include: albuterol, amiodarone, lipitor, plavix, lovenox, dulera, coumadin. Last dose coumadin 02/07/16, lovenox starts 02/08/16.   Labs will be obtained DOS  CT chest 02/05/16:  - Moderate bilateral pleural effusions, right larger than left with compressive atelectasis in the lower lobes. - Cardiomegaly. Densely calcified coronary arteries and aorta. - Ascites around the liver and spleen, similar to prior CT abdomen.  EKG 01/10/16: Electronic atrial pacemaker. Left posterior fascicular block. ST & T wave abnormality, consider lateral ischemia. Prolonged QT. T wave changes are new since last tracing, similar to ECG 01 Apr 2014  TEE 01/02/16:  - Left ventricle: Mildly dilated LV with mild LV hypertrophy. EF 15-20% with diffuse hypokinesis. D-shaped interventricular septum suggestive of RV pressure/volume overload. No evidence of thrombus. - Aortic valve: There was no  stenosis. - Aorta: Grade III plaque in the descending thoracic aorta, normal caliber aorta. - Mitral valve: There was trivial regurgitation. - Left atrium: Moderate left atrial enlargement with no LA appendage thrombus (smoke noted). No evidence of thrombus in the atrial cavity or appendage. - Right ventricle: The cavity size was moderately dilated. Pacer wire or catheter noted in right ventricle. Systolic function was moderately to severely reduced. - Right atrium: The atrium was mildly dilated. - Tricuspid valve: Peak RV-RA gradient (S): 31 mm Hg. - Impressions: Successful cardioversion. No cardiac source of emboli was indentified.  Cardiac cath 05/18/15 (care everywhere):  1. The left main trunk has 10% stenosis.  2. The left anterior descending artery is stented from the proximal/ midvessel junction almost to the apex. In some areas it appears there are two layers of stents. In the mid vessel there is in stent restenosis of 80%, length 79mm, TIMI3 flow. More distally there is 70-80% stenosis, length 95mm, TIMI 3 flow.  3. The circumflex artery is comprised mostly of a bifurcating lateral OM branch. There is 20% mid vessel stenosis.  4. The right coronary artery is dominant. There are stents in the mid and distal vessel. The RCA is occluded proximally.  IMPRESSION: 1. Severe in stent restenosis of the LAD stents, the mid vessel lesion treated with a 2.59mm Neoga ballon with good result, the distal lesions did not respond to cutting balloon angioplasty. Mild circumflex stenosis. Occluded RCA with left to right collaterals.  2. Moderately elevated left sided filling pressure. 3. No gradient across the aortic valve.  Nuclear stress test 11/29/14:  - High risk stress nuclear study with a small, mild mid anterior fixed defect and  small, mild basal inferior fixed defect. There was no significant ischemia.  - EF was 31% with global hypokinesis (low EF makes the study high risk).  - The fixed defects  represent either small areas of prior infarction or (more likely) soft tissue attenuation. . - LV Ejection Fraction: 31%. LV Wall Motion: Global hypokinesis  Reviewed case with Dr. Lissa Hoard.   If no changes, I anticipate pt can proceed with surgery as scheduled.   Willeen Cass, FNP-BC [facility]/Anesthesiology Phone: [phone] 02/11/2016 3:01 PM

## 2016-02-12 ENCOUNTER — Ambulatory Visit (HOSPITAL_COMMUNITY)
Admission: RE | Admit: 2016-02-12 | Discharge: 2016-02-12 | Disposition: A | Discharge status: Home / Self Care | Payer: Medicare HMO | Source: Ambulatory Visit | Attending: Vascular Surgery | Admitting: Vascular Surgery

## 2016-02-12 ENCOUNTER — Encounter (HOSPITAL_COMMUNITY): Payer: Self-pay | Admitting: *Deleted

## 2016-02-12 ENCOUNTER — Encounter (HOSPITAL_COMMUNITY)
Admission: RE | Disposition: A | Discharge status: Home / Self Care | Payer: Self-pay | Source: Ambulatory Visit | Attending: Vascular Surgery

## 2016-02-12 ENCOUNTER — Other Ambulatory Visit: Payer: Self-pay | Admitting: *Deleted

## 2016-02-12 ENCOUNTER — Ambulatory Visit (HOSPITAL_COMMUNITY): Payer: Medicare HMO | Admitting: Emergency Medicine

## 2016-02-12 DIAGNOSIS — Z79899 Other long term (current) drug therapy: Secondary | ICD-10-CM | POA: Insufficient documentation

## 2016-02-12 DIAGNOSIS — Z794 Long term (current) use of insulin: Secondary | ICD-10-CM | POA: Insufficient documentation

## 2016-02-12 DIAGNOSIS — I2510 Atherosclerotic heart disease of native coronary artery without angina pectoris: Secondary | ICD-10-CM | POA: Diagnosis not present

## 2016-02-12 DIAGNOSIS — N185 Chronic kidney disease, stage 5: Secondary | ICD-10-CM | POA: Insufficient documentation

## 2016-02-12 DIAGNOSIS — F0390 Unspecified dementia without behavioral disturbance: Secondary | ICD-10-CM | POA: Diagnosis not present

## 2016-02-12 DIAGNOSIS — Z4931 Encounter for adequacy testing for hemodialysis: Secondary | ICD-10-CM

## 2016-02-12 DIAGNOSIS — J45909 Unspecified asthma, uncomplicated: Secondary | ICD-10-CM | POA: Insufficient documentation

## 2016-02-12 DIAGNOSIS — I2584 Coronary atherosclerosis due to calcified coronary lesion: Secondary | ICD-10-CM | POA: Insufficient documentation

## 2016-02-12 DIAGNOSIS — Z955 Presence of coronary angioplasty implant and graft: Secondary | ICD-10-CM | POA: Diagnosis not present

## 2016-02-12 DIAGNOSIS — Y831 Surgical operation with implant of artificial internal device as the cause of abnormal reaction of the patient, or of later complication, without mention of misadventure at the time of the procedure: Secondary | ICD-10-CM | POA: Diagnosis not present

## 2016-02-12 DIAGNOSIS — I255 Ischemic cardiomyopathy: Secondary | ICD-10-CM | POA: Insufficient documentation

## 2016-02-12 DIAGNOSIS — T82858A Stenosis of vascular prosthetic devices, implants and grafts, initial encounter: Secondary | ICD-10-CM | POA: Diagnosis not present

## 2016-02-12 DIAGNOSIS — Z7902 Long term (current) use of antithrombotics/antiplatelets: Secondary | ICD-10-CM | POA: Insufficient documentation

## 2016-02-12 DIAGNOSIS — Z8674 Personal history of sudden cardiac arrest: Secondary | ICD-10-CM | POA: Insufficient documentation

## 2016-02-12 DIAGNOSIS — I5042 Chronic combined systolic (congestive) and diastolic (congestive) heart failure: Secondary | ICD-10-CM | POA: Insufficient documentation

## 2016-02-12 DIAGNOSIS — Z95810 Presence of automatic (implantable) cardiac defibrillator: Secondary | ICD-10-CM | POA: Diagnosis not present

## 2016-02-12 DIAGNOSIS — I132 Hypertensive heart and chronic kidney disease with heart failure and with stage 5 chronic kidney disease, or end stage renal disease: Secondary | ICD-10-CM | POA: Insufficient documentation

## 2016-02-12 DIAGNOSIS — E1122 Type 2 diabetes mellitus with diabetic chronic kidney disease: Secondary | ICD-10-CM | POA: Insufficient documentation

## 2016-02-12 DIAGNOSIS — Z7901 Long term (current) use of anticoagulants: Secondary | ICD-10-CM | POA: Diagnosis not present

## 2016-02-12 DIAGNOSIS — N186 End stage renal disease: Secondary | ICD-10-CM

## 2016-02-12 HISTORY — PX: AV FISTULA PLACEMENT: SHX1204

## 2016-02-12 LAB — POCT I-STAT 4, (NA,K, GLUC, HGB,HCT)
Glucose, Bld: 113 mg/dL — ABNORMAL HIGH (ref 65–99)
HEMATOCRIT: 39 % (ref 39.0–52.0)
HEMOGLOBIN: 13.3 g/dL (ref 13.0–17.0)
POTASSIUM: 3.1 mmol/L — AB (ref 3.5–5.1)
Sodium: 140 mmol/L (ref 135–145)

## 2016-02-12 LAB — GLUCOSE, CAPILLARY
GLUCOSE-CAPILLARY: 140 mg/dL — AB (ref 65–99)
Glucose-Capillary: 101 mg/dL — ABNORMAL HIGH (ref 65–99)

## 2016-02-12 LAB — PROTIME-INR
INR: 1.44 (ref 0.00–1.49)
Prothrombin Time: 17.6 seconds — ABNORMAL HIGH (ref 11.6–15.2)

## 2016-02-12 SURGERY — ARTERIOVENOUS (AV) FISTULA CREATION
Anesthesia: Monitor Anesthesia Care | Site: Arm Lower | Laterality: Right

## 2016-02-12 MED ORDER — PROTAMINE SULFATE 10 MG/ML IV SOLN
INTRAVENOUS | Status: AC
  Filled 2016-02-12: qty 5

## 2016-02-12 MED ORDER — MIDAZOLAM HCL 5 MG/5ML IJ SOLN
INTRAMUSCULAR | Status: DC | PRN
  Administered 2016-02-12: 1 mg via INTRAVENOUS
  Administered 2016-02-12 (×2): 0.5 mg via INTRAVENOUS

## 2016-02-12 MED ORDER — CHLORHEXIDINE GLUCONATE CLOTH 2 % EX PADS: 6.0000 | MEDICATED_PAD | Freq: Once | CUTANEOUS | Status: DC

## 2016-02-12 MED ORDER — FENTANYL CITRATE (PF) 100 MCG/2ML IJ SOLN
INTRAMUSCULAR | Status: DC | PRN
  Administered 2016-02-12: 25 ug via INTRAVENOUS
  Administered 2016-02-12: 50 ug via INTRAVENOUS

## 2016-02-12 MED ORDER — SODIUM CHLORIDE 0.9 % IJ SOLN
INTRAMUSCULAR | Status: AC
  Filled 2016-02-12: qty 10

## 2016-02-12 MED ORDER — SODIUM CHLORIDE 0.9 % IV SOLN
INTRAVENOUS | Status: DC | PRN
  Administered 2016-02-12: 500 mL

## 2016-02-12 MED ORDER — ONDANSETRON HCL 4 MG/2ML IJ SOLN
INTRAMUSCULAR | Status: DC | PRN
  Administered 2016-02-12: 4 mg via INTRAVENOUS

## 2016-02-12 MED ORDER — SUCCINYLCHOLINE CHLORIDE 20 MG/ML IJ SOLN
INTRAMUSCULAR | Status: AC
  Filled 2016-02-12: qty 1

## 2016-02-12 MED ORDER — OXYCODONE HCL 5 MG PO TABS: 5.0000 mg | ORAL_TABLET | Freq: Four times a day (QID) | ORAL | Status: DC | PRN

## 2016-02-12 MED ORDER — PHENYLEPHRINE HCL 10 MG/ML IJ SOLN
INTRAMUSCULAR | Status: DC | PRN
  Administered 2016-02-12: 40 ug via INTRAVENOUS

## 2016-02-12 MED ORDER — DEXMEDETOMIDINE HCL IN NACL 200 MCG/50ML IV SOLN
INTRAVENOUS | Status: DC | PRN
  Administered 2016-02-12: 0.4 ug/kg/h via INTRAVENOUS

## 2016-02-12 MED ORDER — LIDOCAINE HCL (PF) 1 % IJ SOLN
INTRAMUSCULAR | Status: AC
  Filled 2016-02-12: qty 30

## 2016-02-12 MED ORDER — POTASSIUM CHLORIDE CRYS ER 20 MEQ PO TBCR
20.0000 meq | EXTENDED_RELEASE_TABLET | Freq: Once | ORAL | Status: AC
  Administered 2016-02-12: 20 meq via ORAL
  Filled 2016-02-12: qty 1

## 2016-02-12 MED ORDER — DEXTROSE 5 % IV SOLN
INTRAVENOUS | Status: AC
  Administered 2016-02-12: 1.5 g via INTRAVENOUS
  Filled 2016-02-12: qty 1.5

## 2016-02-12 MED ORDER — 0.9 % SODIUM CHLORIDE (POUR BTL) OPTIME
TOPICAL | Status: DC | PRN
  Administered 2016-02-12: 1000 mL

## 2016-02-12 MED ORDER — FENTANYL CITRATE (PF) 250 MCG/5ML IJ SOLN
INTRAMUSCULAR | Status: AC
  Filled 2016-02-12: qty 5

## 2016-02-12 MED ORDER — HEPARIN SODIUM (PORCINE) 1000 UNIT/ML IJ SOLN
INTRAMUSCULAR | Status: DC | PRN
  Administered 2016-02-12: 6000 [IU] via INTRAVENOUS

## 2016-02-12 MED ORDER — MIDAZOLAM HCL 2 MG/2ML IJ SOLN
INTRAMUSCULAR | Status: AC
  Filled 2016-02-12: qty 2

## 2016-02-12 MED ORDER — EPHEDRINE SULFATE 50 MG/ML IJ SOLN
INTRAMUSCULAR | Status: AC
  Filled 2016-02-12: qty 1

## 2016-02-12 MED ORDER — PROTAMINE SULFATE 10 MG/ML IV SOLN
INTRAVENOUS | Status: DC | PRN
  Administered 2016-02-12: 20 mg via INTRAVENOUS
  Administered 2016-02-12: 10 mg via INTRAVENOUS

## 2016-02-12 MED ORDER — ONDANSETRON HCL 4 MG/2ML IJ SOLN
INTRAMUSCULAR | Status: AC
  Filled 2016-02-12: qty 2

## 2016-02-12 MED ORDER — PROPOFOL 10 MG/ML IV BOLUS
INTRAVENOUS | Status: AC
  Filled 2016-02-12: qty 20

## 2016-02-12 MED ORDER — LIDOCAINE HCL (PF) 1 % IJ SOLN
INTRAMUSCULAR | Status: DC | PRN
  Administered 2016-02-12: 19 mL

## 2016-02-12 SURGICAL SUPPLY — 32 items
ARMBAND PINK RESTRICT EXTREMIT (MISCELLANEOUS) ×3 IMPLANT
CANISTER SUCTION 2500CC (MISCELLANEOUS) ×3 IMPLANT
CANNULA VESSEL 3MM 2 BLNT TIP (CANNULA) ×3 IMPLANT
CLIP TI MEDIUM 6 (CLIP) ×3 IMPLANT
CLIP TI WIDE RED SMALL 6 (CLIP) ×6 IMPLANT
COVER PROBE W GEL 5X96 (DRAPES) IMPLANT
DECANTER SPIKE VIAL GLASS SM (MISCELLANEOUS) ×3 IMPLANT
ELECT REM PT RETURN 9FT ADLT (ELECTROSURGICAL) ×3
ELECTRODE REM PT RTRN 9FT ADLT (ELECTROSURGICAL) ×1 IMPLANT
GLOVE BIO SURGEON STRL SZ7.5 (GLOVE) ×3 IMPLANT
GLOVE BIO SURGEON STRL SZ8 (GLOVE) ×6 IMPLANT
GLOVE BIOGEL PI IND STRL 6.5 (GLOVE) ×2 IMPLANT
GLOVE BIOGEL PI IND STRL 7.0 (GLOVE) ×1 IMPLANT
GLOVE BIOGEL PI IND STRL 8 (GLOVE) ×3 IMPLANT
GLOVE BIOGEL PI INDICATOR 6.5 (GLOVE) ×4
GLOVE BIOGEL PI INDICATOR 7.0 (GLOVE) ×2
GLOVE BIOGEL PI INDICATOR 8 (GLOVE) ×6
GOWN STRL REUS W/ TWL LRG LVL3 (GOWN DISPOSABLE) ×3 IMPLANT
GOWN STRL REUS W/TWL LRG LVL3 (GOWN DISPOSABLE) ×6
KIT BASIN OR (CUSTOM PROCEDURE TRAY) ×3 IMPLANT
KIT ROOM TURNOVER OR (KITS) ×3 IMPLANT
LIQUID BAND (GAUZE/BANDAGES/DRESSINGS) ×3 IMPLANT
NS IRRIG 1000ML POUR BTL (IV SOLUTION) ×3 IMPLANT
PACK CV ACCESS (CUSTOM PROCEDURE TRAY) ×3 IMPLANT
PAD ARMBOARD 7.5X6 YLW CONV (MISCELLANEOUS) ×6 IMPLANT
SPONGE SURGIFOAM ABS GEL 100 (HEMOSTASIS) IMPLANT
SUT PROLENE 6 0 BV (SUTURE) ×6 IMPLANT
SUT VIC AB 3-0 SH 27 (SUTURE) ×2
SUT VIC AB 3-0 SH 27X BRD (SUTURE) ×1 IMPLANT
SUT VICRYL 4-0 PS2 18IN ABS (SUTURE) ×3 IMPLANT
UNDERPAD 30X30 INCONTINENT (UNDERPADS AND DIAPERS) ×3 IMPLANT
WATER STERILE IRR 1000ML POUR (IV SOLUTION) ×3 IMPLANT

## 2016-02-12 NOTE — Op Note (Signed)
    NAME: Tony Freeman  MRN: [id number] DOB: 29-Dec-1950    DATE OF OPERATION: 02/12/2016  PREOP DIAGNOSIS: Stage V chronic kidney disease  POSTOP DIAGNOSIS: Same  PROCEDURE: Right brachiocephalic AV fistula  SURGEON: Judeth Cornfield. Scot Dock, MD, FACS  ASSIST: Leontine Locket, PA  ANESTHESIA: local with sedation   EBL: minimal  INDICATIONS: [name] is a 65 y.o. male ho presents for new access. 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  FINDINGS: 4 mm right upper arm cephalic vein. Monophasic radial signal to Doppler the completion.  TECHNIQUE: The patient was taken to the operating room and sedated by anesthesia. The right upper extremity was prepped and draped in usual sterile fashion. After the skin was anesthetized with 1% lidocaine, a transverse incision was made above the antecubital level. Here the cephalic vein was dissected free. It was ligated distally and irrigated up nicely with heparinized saline. The brachial artery was dissected free beneath the fascia. The patient was heparinized. Brachial artery was clamped proximally and distally and a longitudinal arteriotomy was made. The vein was sewn end-to-side to the artery using continuous 6-0 Prolene suture. At the completion was a good thrill in the fistula. There was a monophasic radial signal with the Doppler. Hemostasis was obtained in the wound. The heparin was partially reversed with protamine. The wound was closed with deep 3-0 Vicryl the skin closed with 4-0 Vicryl. Local band was applied. The patient tolerated the procedure well and transferred to the recovery room in stable condition. All needle and sponge counts were correct.  Deitra Mayo, MD, FACS [facility]  DATE OF DICTATION:   02/12/2016

## 2016-02-12 NOTE — Progress Notes (Signed)
Patient BP 84/74 Dr. Glennon Mac notified before unhooking from Dialysis cath . Patient Oriented x4 tolerating PO's. Drowsy. Patient was going straight to Dialysis from Hospital .

## 2016-02-12 NOTE — Transfer of Care (Signed)
Immediate Anesthesia Transfer of Care Note  Patient: Tony Freeman  Procedure(s) Performed: Procedure(s): Creation of Right Arm  Brachiocephalic ARTERIOVENOUS (AV) FISTULA  (Right)  Patient Location: PACU  Anesthesia Type:MAC  Level of Consciousness: awake, alert  and oriented  Airway & Oxygen Therapy: Patient Spontanous Breathing and Patient connected to nasal cannula oxygen  Post-op Assessment: Report given to RN and Post -op Vital signs reviewed and stable  Post vital signs: Reviewed and stable  Last Vitals:  Filed Vitals:   02/12/16 0638  BP: 114/93  Pulse: 107  Temp: 36.7 C  Resp: 18    Complications: No apparent anesthesia complications

## 2016-02-12 NOTE — Anesthesia Preprocedure Evaluation (Addendum)
Anesthesia Evaluation  Patient identified by MRN, date of birth, ID band Patient awake    Reviewed: Allergy & Precautions, H&P , NPO status , Patient's Chart, lab work & pertinent test results  History of Anesthesia Complications Negative for: history of anesthetic complications  Airway Mallampati: II  TM Distance: >3 FB Neck ROM: full    Dental no notable dental hx. (+) Dental Advisory Given   Pulmonary asthma ,    Pulmonary exam normal breath sounds clear to auscultation       Cardiovascular hypertension, Pt. on medications + CAD, + Past MI (04/2015), + Cardiac Stents, + Peripheral Vascular Disease and +CHF  Normal cardiovascular exam+ dysrhythmias Atrial Fibrillation + Cardiac Defibrillator  Rhythm:regular Rate:Normal  Severe ischemic cardiomyopathy, ECHO 11/2015 EF 15-20%, Multiple stents, Cardiac arrest 04/2015, AF, Grade 4 diastolic dysfunction   Neuro/Psych CVA, Residual Symptoms negative psych ROS   GI/Hepatic negative GI ROS, Neg liver ROS,   Endo/Other  diabetes, Insulin Dependent  Renal/GU Dialysis and ESRFRenal disease (T/T/S)     Musculoskeletal   Abdominal   Peds  Hematology  (+) anemia ,   Anesthesia Other Findings   Reproductive/Obstetrics negative OB ROS                            Anesthesia Physical Anesthesia Plan  ASA: IV  Anesthesia Plan: MAC   Post-op Pain Management:    Induction: Intravenous  Airway Management Planned: Simple Face Mask  Additional Equipment: None  Intra-op Plan:   Post-operative Plan:   Informed Consent: I have reviewed the patients History and Physical, chart, labs and discussed the procedure including the risks, benefits and alternatives for the proposed anesthesia with the patient or authorized representative who has indicated his/her understanding and acceptance.   Dental Advisory Given  Plan Discussed with: Anesthesiologist, CRNA  and Surgeon  Anesthesia Plan Comments: (Nearly died 2 months prior due to PEA arrest given progressive systolic dysfunction and CAD. Risk of MACE with any procedure is significant given renal failure, CAD, systolic heart function that is abysmal in addition to severely elevated pulmonary pressures that have now resulted in right heart dysfunction due a severe degree. Will proceed with surgery given no acute symptoms and normal labs since it is required for vascular access to hemodialysis. Patient can not be further optimized medically  Plan will be to place magnet and ICD rep will re-activate remotely in pacu.)       Anesthesia Quick Evaluation

## 2016-02-12 NOTE — H&P (Signed)
Patient name: Tony Esperanza CrumptonMRN: [id number] DOB: 02-28-1952Sex: male  HPI: Tony Freeman is a 65 y.o. male who presented for new access and underwent a left brachiocephalic AV fistula on [id number]. 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 I will arrange for him to be scheduled for new access in the right arm. I have reviewed his previous vein map. Cephalic vein on the right appears marginal in size. He could potentially be a candidate for a basilic vein transposition on the right. Otherwise he would require placement of an AV graft. I will try to schedule this for a nondialysis day tentatively, 11/30/2015.  Current Outpatient Prescriptions  Medication Sig Dispense Refill  . acetaminophen (TYLENOL) 325 MG tablet Take two tablets by mouth every 8 hours as needed for pain    . albuterol (PROVENTIL HFA;VENTOLIN HFA) 108 (90 BASE) MCG/ACT inhaler Inhale 2 puffs into the lungs every 6 (six) hours as needed for wheezing or shortness of breath.    Marland Kitchen albuterol (PROVENTIL) (2.5 MG/3ML) 0.083% nebulizer solution Take 2.5 mg by nebulization every 6 (six) hours as needed for wheezing or shortness of breath.    Marland Kitchen amiodarone (PACERONE) 100 MG tablet Take one tablet by mouth once daily for AFIB    . atorvastatin (LIPITOR) 40 MG tablet Take 40 mg by mouth daily.    . B Complex-C-Folic Acid (NEPHRO-VITE PO) Take 1 tablet by mouth daily.    . carvedilol (COREG) 6.25 MG tablet Take 1.5 tablets (9.375 mg total) by mouth 2 (two) times daily with a meal. 45 tablet 6  . clopidogrel (PLAVIX) 75 MG tablet Take 1 tablet (75 mg total) by mouth daily. 30 tablet 3  . docusate sodium (COLACE) 100 MG capsule Take 100 mg by mouth daily.     . insulin lispro (HUMALOG) 100 UNIT/ML injection Inject 10 Units  into the skin 3 (three) times daily before meals. For BS > 140    . lisinopril (PRINIVIL,ZESTRIL) 5 MG tablet Take 1 tablet (5 mg total) by mouth daily. 30 tablet 6  . loratadine (CLARITIN) 10 MG tablet Take one tablet by mouth once daily for allergies    . Multiple Vitamin (MULTIVITAMIN) tablet Take 1 tablet by mouth daily.    . nitroGLYCERIN (NITROSTAT) 0.4 MG SL tablet Place 1 tablet (0.4 mg total) under the tongue every 5 (five) minutes as needed for chest pain. 30 tablet 1  . warfarin (COUMADIN) 5 MG tablet Take 1 tablet (5 mg total) by mouth as directed. 45 tablet 3   No current facility-administered medications for this visit.    REVIEW OF SYSTEMS:  [X]  denotes positive finding, [ ]  denotes negative finding Cardiac  Comments:  Chest pain or chest pressure:    Shortness of breath upon exertion:    Short of breath when lying flat:    Irregular heart rhythm:    Constitutional    Fever or chills:      PHYSICAL EXAM: Filed Vitals:   11/07/15 0921  BP: 105/63  Pulse: 69  Temp: 97 F (36.1 C)  TempSrc: Oral  Height: 5\' 6"  (1.676 m)  Weight: 177 lb 4.8 oz (80.423 kg)  SpO2: 100%    GENERAL: The patient is a well-nourished male, in no acute distress. The vital signs are documented above. CARDIOVASCULAR: There is a regular rate and rhythm. PULMONARY: There is  good air exchange bilaterally without wheezing or rales. This fistula has a good thrill just above the antecubital level but then is more difficult to follow.  DUPLEX LEFT UPPER ARM AV FISTULA: I have independently interpreted his duplex of his left upper arm fistula. The diameters of the fistula range from 0.4-0.58 cm. Thus the fistula is currently not adequate in diameter. In addition, there are several areas of increased colostomy within the fistula and also multiple competing branches identified.  MEDICAL ISSUES:  POORLY MATURING LEFT  BRACHIOCEPHALIC AV FISTULA: Given that the fistula is not maturing adequately and there appeared to be some areas of stenosis and also some competing branches, I have recommended that we proceed with a fistulogram. If he has stenoses amenable to venoplasty this could be addressed at the same time. If the competing branches appear to be significant then we can arrange to have these ligated. His procedure is scheduled for 11/19/2015. We will have to hold his Coumadin for 4 days prior to the procedure.  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 I will arrange for him to be scheduled for new access in the right arm. I have reviewed his previous vein map. Cephalic vein on the right appears marginal in size. He could potentially be a candidate for a basilic vein transposition on the right. Otherwise he would require placement of an AV graft. I will try to schedule this for a nondialysis day tentatively, 11/30/2015.   Deitra Mayo [facility] [place]: [phone]

## 2016-02-12 NOTE — Discharge Instructions (Signed)
° ° °  02/12/2016 Damico Trzcinski [id number] 1951-07-25  Surgeon(s): Angelia Mould, MD  Procedure(s): Creation of Right Arm  Brachiocephalic ARTERIOVENOUS (AV) FISTULA   x Do not stick fistula for 12 weeks    Lovenox/coumadin instructions: 02/12/16- Procedure Day - No Lovenox - Resume Coumadin in the evening or as directed by doctor (take an extra half tablet with usual dose for 2 days then resume normal dose)  02/13/16- Resume Lovenox injection 80mg s in the fatty tissue at 6pm and take coumadin   02/14/16- Continue Lovenox injection 80mg s everyday at 6pm into fatty abdominal tissue, rotate sites. Take Coumadin.   02/15/16- Continue Lovenox injection 80mg s everyday at 6pm into fatty abdominal tissue, rotate sites. Take Coumadin.  02/16/16- Continue Lovenox injection 80mg s everyday at 6pm into fatty abdominal tissue, rotate sites. Take Coumadin.  02/17/16- Continue Lovenox injection 80mg s everyday at 6pm into fatty abdominal tissue, rotate sites. Take Coumadin.  02/18/16- Come in for Coumadin Clinic appointment

## 2016-02-13 ENCOUNTER — Ambulatory Visit (INDEPENDENT_AMBULATORY_CARE_PROVIDER_SITE_OTHER): Payer: Medicare HMO | Admitting: *Deleted

## 2016-02-13 ENCOUNTER — Encounter (HOSPITAL_COMMUNITY): Payer: Self-pay | Admitting: Vascular Surgery

## 2016-02-13 DIAGNOSIS — I255 Ischemic cardiomyopathy: Secondary | ICD-10-CM

## 2016-02-13 NOTE — Anesthesia Postprocedure Evaluation (Signed)
Anesthesia Post Note  Patient: Tony Freeman  Procedure(s) Performed: Procedure(s) (LRB): Creation of Right Arm  Brachiocephalic ARTERIOVENOUS (AV) FISTULA  (Right)  Patient location during evaluation: PACU Anesthesia Type: MAC Level of consciousness: awake and alert Pain management: pain level controlled Vital Signs Assessment: post-procedure vital signs reviewed and stable Respiratory status: spontaneous breathing, nonlabored ventilation, respiratory function stable and patient connected to nasal cannula oxygen Cardiovascular status: stable and blood pressure returned to baseline Anesthetic complications: no    Last Vitals:  Filed Vitals:   02/12/16 1145 02/12/16 1158  BP: 85/68 98/54  Pulse: 61 62  Temp: 36.2 C 36.3 C  Resp: 18 16    Last Pain: There were no vitals filed for this visit.               Riccardo Dubin

## 2016-02-13 NOTE — Progress Notes (Signed)
Remote ICD transmission.   

## 2016-02-14 ENCOUNTER — Ambulatory Visit: Payer: Medicare HMO | Admitting: Internal Medicine

## 2016-02-18 ENCOUNTER — Ambulatory Visit (INDEPENDENT_AMBULATORY_CARE_PROVIDER_SITE_OTHER): Payer: Medicare HMO | Admitting: Pharmacist

## 2016-02-18 ENCOUNTER — Telehealth: Payer: Self-pay | Admitting: Vascular Surgery

## 2016-02-18 DIAGNOSIS — I4891 Unspecified atrial fibrillation: Secondary | ICD-10-CM

## 2016-02-18 DIAGNOSIS — Z5181 Encounter for therapeutic drug level monitoring: Secondary | ICD-10-CM | POA: Diagnosis not present

## 2016-02-18 LAB — POCT INR: INR: 2.1

## 2016-02-18 NOTE — Telephone Encounter (Signed)
-----   Message from Mena Goes, RN sent at 02/12/2016 10:44 AM EDT ----- Regarding: schedule   ----- Message -----    From: Angelia Mould, MD    Sent: 02/12/2016   9:13 AM      To: [facility] Charge Pool Subject: charge                                         This patient had a right brachiocephalic AV fistula. Assistant  Liana Crocker. He will need a follow up visit in 6 weeks with a duplex at that time to check on the maturation of his fistula.

## 2016-02-18 NOTE — Telephone Encounter (Signed)
sched appt 6/14, lab at 1 and MD at 1:45. Spoke to pt to inform them of appt.

## 2016-02-25 ENCOUNTER — Other Ambulatory Visit: Payer: Self-pay | Admitting: Cardiology

## 2016-02-25 ENCOUNTER — Other Ambulatory Visit (HOSPITAL_COMMUNITY): Payer: Self-pay | Admitting: Cardiology

## 2016-02-26 NOTE — Telephone Encounter (Signed)
Tony Freeman, I am routing this refill encounter to you because I went in to the chart to refill the atorvastatin and when I got in there I seen that it was discontinued at his last ov with him in December. However the medication is still on his list. I called the patient and asked him was he still taking and he said he cant remember. I was wondering if you could find out for sure and let me know so I can either refill or refuse the medication request. I just want to be on the safe side. Thank you for your time have a happy Tuesday.

## 2016-02-27 ENCOUNTER — Telehealth: Payer: Self-pay | Admitting: Cardiology

## 2016-02-27 NOTE — Telephone Encounter (Signed)
Left message to call back  

## 2016-02-27 NOTE — Telephone Encounter (Signed)
New message     Care manager for rehab visiting at the patient home was wondering if a referral could re-faxed for the patient to seen at other place for cardiac rehab other than at his home.

## 2016-02-27 NOTE — Telephone Encounter (Signed)
Dr. Curt Bears did not order this medication.  He did not discontinue it either.  When patient was here for office visit he reported taking it differently from what we had in system -- so this was simply updating dosage in medication list. Pt should contact ordering provider for refills.

## 2016-03-02 ENCOUNTER — Other Ambulatory Visit: Payer: Self-pay | Admitting: Cardiology

## 2016-03-03 ENCOUNTER — Ambulatory Visit (INDEPENDENT_AMBULATORY_CARE_PROVIDER_SITE_OTHER): Payer: Medicare HMO | Admitting: Internal Medicine

## 2016-03-03 ENCOUNTER — Encounter: Payer: Self-pay | Admitting: Internal Medicine

## 2016-03-03 VITALS — BP 104/66 | HR 69 | Ht 66.0 in | Wt 180.0 lb

## 2016-03-03 DIAGNOSIS — J948 Other specified pleural conditions: Secondary | ICD-10-CM

## 2016-03-03 DIAGNOSIS — J90 Pleural effusion, not elsewhere classified: Secondary | ICD-10-CM | POA: Insufficient documentation

## 2016-03-03 NOTE — Patient Instructions (Signed)
Please see patient coordinator before you leave today  to schedule u/s guided thoracentesis at [facility] this week then I will with results and recommendations

## 2016-03-03 NOTE — Progress Notes (Signed)
   Subjective:    Patient ID: Tony Freeman, male    DOB: May 30, 1951,     MRN: [id number]  HPI  53 yobm never smoker with known severe systolic dysfunction and ESRF on HD T-th-sat referred to pulmonary clinic 03/03/2016 by Rico Junker for R >> L effusions    03/04/2016 1st [facility] office visit/ Wert   Chief Complaint  Patient presents with  . pulmonary consult    pt c/o occ SOB, non prod cough, occ chest pain X65mo. denies wheezing or CP  indolent onset sob > one month min progressive no more sob Monday pm off 2 HD and no better breathing after HD assoc with obvious peripheral swelling and ascites documented on CT 02/05/16 with findings of R effusion dating back to 01/24/2015 and assoc with sev pillow orthopnea but no cp   No obvious other patterns in day to day or daytime variabilty or assoc excess/ purulent sputum or mucus plugs  or cp or chest tightness, subjective wheeze overt sinus or hb symptoms. No unusual exp hx or h/o childhood pna/ asthma or knowledge of premature birth.  Sleeping ok without nocturnal  or early am exacerbation  of respiratory  c/o's or need for noct saba. Also denies any obvious fluctuation of symptoms with weather or environmental changes or other aggravating or alleviating factors except as outlined above   Current Medications, Allergies, Complete Past Medical History, Past Surgical History, Family History, and Social History were reviewed in [employer] record.           Review of Systems  Constitutional: Negative for fever and unexpected weight change.  HENT: Negative for congestion, dental problem, ear pain, nosebleeds, postnasal drip, rhinorrhea, sinus pressure, sneezing, sore throat and trouble swallowing.   Eyes: Negative for redness and itching.  Respiratory: Positive for cough, chest tightness, shortness of breath and wheezing.   Cardiovascular: Positive for leg swelling. Negative for palpitations.    Gastrointestinal: Positive for nausea and vomiting.  Genitourinary: Negative for dysuria.  Musculoskeletal: Negative for joint swelling.  Skin: Negative for rash.  Neurological: Negative for headaches.  Hematological: Bruises/bleeds easily.  Psychiatric/Behavioral: Negative for dysphoric mood. The patient is not nervous/anxious.        Objective:   Physical Exam  amb bm slowed mentation doesn't know why he's hjere or who sent him    Wt Readings from Last 3 Encounters:  03/03/16 180 lb (81.647 kg)  02/12/16 182 lb (82.555 kg)  01/19/16 182 lb 5.1 oz (82.7 kg)    Vital signs reviewed  HEENT: nl dentition, turbinates, and oropharynx. Nl external ear canals without cough reflex   NECK :  without JVD/Nodes/TM/ nl carotid upstrokes bilaterally   LUNGS: no acc muscle use,  Nl contour chest which is clear to A and P bilaterally without cough on insp or exp maneuvers   CV:  RRR  no s3 or murmur or increase in P2, no edema   ABD:  soft and nontender with nl inspiratory excursion in the supine position. No bruits or organomegaly, bowel sounds nl  MS:  Nl gait/ ext warm without deformities, calf tenderness, cyanosis or clubbing No obvious joint restrictions   SKIN: warm and dry without lesions    NEURO:  alert, slowed mentation but knows day of week,   no motor deficits           Assessment & Plan:

## 2016-03-04 ENCOUNTER — Encounter: Payer: Self-pay | Admitting: Internal Medicine

## 2016-03-04 NOTE — Assessment & Plan Note (Signed)
Dates back to at least 01/24/15 by cxr  CT Chest 02/05/16 Moderate bilateral pleural effusions, right larger than left with compressive atelectasis in the lower lobes and Cardiomegaly.   Ascites around the liver and spleen, similar to prior CT abdomen. - US thoracentesis >>>  Almost certainly hydrostatic R >> L assoc with severe LVDysfunction and ascites and really not much to offer in terms of medical management if can't tol HD to drier wt s dropping bp (same would likely happen with a pleurex catheter) and if fact with bp running low need to be careful not to drop his bp with thoracentesis so ideally should be done prior to HD on a Monday  Discussed in detail all the  indications, usual  risks and alternatives  relative to the benefits with patient and son who agree to proceed with thoracentesis.

## 2016-03-07 ENCOUNTER — Ambulatory Visit (HOSPITAL_COMMUNITY)
Admission: RE | Admit: 2016-03-07 | Discharge: 2016-03-07 | Disposition: A | Discharge status: Home / Self Care | Payer: Medicare HMO | Source: Ambulatory Visit | Attending: Internal Medicine | Admitting: Internal Medicine

## 2016-03-07 ENCOUNTER — Ambulatory Visit (HOSPITAL_COMMUNITY)
Admission: RE | Admit: 2016-03-07 | Discharge: 2016-03-07 | Disposition: A | Discharge status: Home / Self Care | Payer: Medicare HMO | Source: Ambulatory Visit | Attending: Radiology | Admitting: Radiology

## 2016-03-07 DIAGNOSIS — J948 Other specified pleural conditions: Secondary | ICD-10-CM | POA: Diagnosis present

## 2016-03-07 DIAGNOSIS — J90 Pleural effusion, not elsewhere classified: Secondary | ICD-10-CM

## 2016-03-07 DIAGNOSIS — Z9889 Other specified postprocedural states: Secondary | ICD-10-CM | POA: Insufficient documentation

## 2016-03-07 LAB — BODY FLUID CELL COUNT WITH DIFFERENTIAL
EOS FL: 0 %
LYMPHS FL: 40 %
MONOCYTE-MACROPHAGE-SEROUS FLUID: 58 % (ref 50–90)
NEUTROPHIL FLUID: 2 % (ref 0–25)
Total Nucleated Cell Count, Fluid: 204 cu mm (ref 0–1000)

## 2016-03-07 LAB — LACTATE DEHYDROGENASE, PLEURAL OR PERITONEAL FLUID: LD FL: 101 U/L — AB (ref 3–23)

## 2016-03-07 LAB — GRAM STAIN

## 2016-03-07 LAB — GLUCOSE, SEROUS FLUID: GLUCOSE FL: 122 mg/dL

## 2016-03-07 LAB — PROTEIN, BODY FLUID: Total protein, fluid: 3.8 g/dL

## 2016-03-07 NOTE — Procedures (Signed)
Ultrasound-guided diagnostic and therapeutic right thoracentesis performed yielding 1.6 liters of turbid, yellow fluid. No immediate complications. Follow-up chest x-ray pending. The fluid was sent to the lab for preordered studies. Dr. Gustavus Bryant office was notified of above.

## 2016-03-08 LAB — TRIGLYCERIDES, BODY FLUIDS: Triglycerides, Fluid: 177 mg/dL

## 2016-03-10 NOTE — Progress Notes (Signed)
Quick Note:  Spoke with pt and notified of results per Dr. Wert. Pt verbalized understanding and denied any questions.  ______ 

## 2016-03-12 ENCOUNTER — Ambulatory Visit (INDEPENDENT_AMBULATORY_CARE_PROVIDER_SITE_OTHER): Payer: Medicare HMO | Admitting: *Deleted

## 2016-03-12 DIAGNOSIS — Z5181 Encounter for therapeutic drug level monitoring: Secondary | ICD-10-CM | POA: Diagnosis not present

## 2016-03-12 DIAGNOSIS — I4891 Unspecified atrial fibrillation: Secondary | ICD-10-CM | POA: Diagnosis not present

## 2016-03-12 LAB — CULTURE, BODY FLUID W GRAM STAIN -BOTTLE: Culture: NO GROWTH

## 2016-03-12 LAB — POCT INR: INR: 3.4

## 2016-03-12 LAB — CULTURE, BODY FLUID-BOTTLE

## 2016-03-14 ENCOUNTER — Ambulatory Visit (INDEPENDENT_AMBULATORY_CARE_PROVIDER_SITE_OTHER): Payer: Medicare HMO | Admitting: Internal Medicine

## 2016-03-14 ENCOUNTER — Ambulatory Visit (INDEPENDENT_AMBULATORY_CARE_PROVIDER_SITE_OTHER)
Admission: RE | Admit: 2016-03-14 | Discharge: 2016-03-14 | Disposition: A | Discharge status: Home / Self Care | Payer: Medicare HMO | Source: Ambulatory Visit | Attending: Internal Medicine | Admitting: Internal Medicine

## 2016-03-14 ENCOUNTER — Encounter: Payer: Self-pay | Admitting: Internal Medicine

## 2016-03-14 VITALS — HR 82 | Ht 66.0 in | Wt 177.0 lb

## 2016-03-14 DIAGNOSIS — J948 Other specified pleural conditions: Secondary | ICD-10-CM

## 2016-03-14 DIAGNOSIS — J90 Pleural effusion, not elsewhere classified: Secondary | ICD-10-CM

## 2016-03-14 NOTE — Progress Notes (Signed)
Subjective:    Patient ID: Tony Freeman, male    DOB: May 20, 1951,     MRN: [id number]    Brief patient profile:  80 yobm never smoker with known severe systolic dysfunction and ESRF on HD T-th-sat referred to pulmonary clinic 03/03/2016 by Rico Junker for R >> L effusions     History of Present Illness  03/04/2016 1st [facility] office visit/ Keyon Liller   Chief Complaint  Patient presents with  . pulmonary consult    pt c/o occ SOB, non prod cough, occ chest pain X62mo. denies wheezing or CP  indolent onset sob > one month min progressive no more sob Monday pm off 2 HD and no better breathing after HD assoc with obvious peripheral swelling and ascites documented on CT 02/05/16 with findings of R effusion dating back to 01/24/2015 and assoc with sev pillow orthopnea but no cp rec R thoracentesis >>Dates back to at least 01/24/15 by cxr  CT Chest 02/05/16 Moderate bilateral pleural effusions, right larger than left with compressive atelectasis in the lower lobes and Cardiomegaly.   Ascites around the liver and spleen, similar to prior CT abdomen. - US thoracentesis  03/07/16 >  1. 6 liters  Borderline exudate by prot 3.8/ wbc 204 M> L > P neg / cyt neg     03/14/2016  f/u ov/Vishal Sandlin re: bilateral effusions  Chief Complaint  Patient presents with  . Follow-up    Breathing is unchanged. He also c/o non prod cough.   much better p t centesis with doe but about 50% back to where he was with sob w/in a few days of the procedure and no better in terms of being able to lie flat, though never really tried even the next day or two after.   No obvious day to day or daytime variability or assoc excess/ purulent sputum or mucus plugs or hemoptysis or cp or chest tightness, subjective wheeze or overt sinus or hb symptoms. No unusual exp hx or h/o childhood pna/ asthma or knowledge of premature birth.  Sleeping ok without nocturnal  or early am exacerbation  of respiratory  c/o's or need for  noct saba. Also denies any obvious fluctuation of symptoms with weather or environmental changes or other aggravating or alleviating factors except as outlined above   Current Medications, Allergies, Complete Past Medical History, Past Surgical History, Family History, and Social History were reviewed in [employer] record.  ROS  The following are not active complaints unless bolded sore throat, dysphagia, dental problems, itching, sneezing,  nasal congestion or excess/ purulent secretions, ear ache,   fever, chills, sweats, unintended wt loss, classically pleuritic or exertional cp,  orthopnea pnd or leg swelling, presyncope, palpitations, abdominal pain, anorexia, nausea, vomiting, diarrhea  or change in bowel or bladder habits, change in stools or urine, dysuria,hematuria,  rash, arthralgias, visual complaints, headache, numbness, weakness or ataxia or problems with walking or coordination,  change in mood/affect or memory.                           Objective:   Physical Exam  amb bm slowed mentation doesn't know why he's here or who sent him     03/14/2016       177   03/03/16 180 lb (81.647 kg)  02/12/16 182 lb (82.555 kg)  01/19/16 182 lb 5.1 oz (82.7 kg)    Vital signs reviewed  HEENT: nl dentition, turbinates,  and oropharynx. Nl external ear canals without cough reflex   NECK :  without JVD/Nodes/TM/ nl carotid upstrokes bilaterally   LUNGS: no acc muscle use,  Mild decrease in bs with dullness at R base and up one third , L lung clear to a and P x minimal decrease bs at base  CV:  RRR  no s3 or murmur or increase in P2, 1-2+ pitting edema both legs  ABD:  Mod distended/ shifting dullness noted/ but soft and nontender with nl inspiratory excursion in the supine position. No bruits or organomegaly, bowel sounds nl  MS:  Nl gait/ ext warm without deformities, calf tenderness, cyanosis or clubbing No obvious joint restrictions   SKIN: warm and  dry without lesions    NEURO:  alert, slowed mentation but knows day of week,   no motor deficits      CXR PA and Lateral:   03/14/2016 :    I personally reviewed images and agree with radiology impression as follows:    There has been slight re-accumulation of pleural fluid on the right since such that the effusion is now moderate in volume. There is slightly decreased pleural fluid on the left.        Assessment & Plan:

## 2016-03-14 NOTE — Patient Instructions (Addendum)
Please remember to go to the  xray  department downstairs for your tests - we will call you with the results when they are available.  I will arrange your follow up thru Dr Florene Glen / Aundra Dubin as to how to manage your fluid issues longterm

## 2016-03-16 ENCOUNTER — Encounter: Payer: Self-pay | Admitting: Internal Medicine

## 2016-03-16 NOTE — Assessment & Plan Note (Addendum)
Dates back to at least 01/24/15 by cxr  Echo 01/02/16 - Left ventricle: Mildly dilated LV with mild LV hypertrophy. EF  15-20% with diffuse hypokinesis. D-shaped interventricular septum  suggestive of RV pressure/volume overload. No evidence of  thrombus. - Aortic valve: There was no stenosis. - Aorta: Grade III plaque in the descending thoracic aorta, normal  caliber aorta. - Mitral valve: There was trivial regurgitation. - Left atrium: Moderate left atrial enlargement CT Chest 02/05/16 Moderate bilateral pleural effusions, right larger than left with compressive atelectasis in the lower lobes and Cardiomegaly.   Ascites around the liver and spleen, similar to prior CT abdomen. - US thoracentesis  03/07/16 >  1. 6 liters  Borderline exudate by prot 3.8/ wbc 204 M> L > P neg / cyt neg  - Repeat cxr 03/14/2016 >   effusion back about 50% of pre tap  I had an extended final summary discussion with the patient and son reviewing all relevant studies completed to date and  lasting 15 to 20 minutes of a 25 minute visit on the following issues:    Clearly this is a vol issue from cri/ hydrostatic issues from that ideally (to the extent possible) controlled systemically as he also has ascites and peripheral edema and would be at risk of complications from repeated thoracentesis for this problem and pleurex or pleurodesis is not usually effective in this setting.  Would only retap prn symptoms back to where they were before and not the L effusion is actually better since the R effusion was tapped so there may well be room to attempt to dry him out further by HD if tol and defer to renal when/ if to do IR taps for relief of symptoms and time that with HD since both result in lowering systemic bp  Pulmonary f/u is prn

## 2016-03-17 NOTE — Progress Notes (Signed)
Quick Note:  Spoke with pt and notified of results per Dr. Wert. Pt verbalized understanding and denied any questions.  ______ 

## 2016-03-21 ENCOUNTER — Encounter: Payer: Self-pay | Admitting: Cardiology

## 2016-03-21 LAB — CUP PACEART REMOTE DEVICE CHECK
Date Time Interrogation Session: 20170526155932
HighPow Impedance: 52 Ohm
Lead Channel Pacing Threshold Amplitude: 0.5 V
Lead Channel Pacing Threshold Pulse Width: 0.4 ms
Lead Channel Setting Pacing Amplitude: 1.7 V
Lead Channel Setting Pacing Pulse Width: 0.4 ms
MDC IDC MSMT LEADCHNL RA IMPEDANCE VALUE: 521 Ohm
MDC IDC MSMT LEADCHNL RA PACING THRESHOLD AMPLITUDE: 1 V
MDC IDC MSMT LEADCHNL RA PACING THRESHOLD PULSEWIDTH: 0.4 ms
MDC IDC MSMT LEADCHNL RA SENSING INTR AMPL: 2.6 mV
MDC IDC MSMT LEADCHNL RV IMPEDANCE VALUE: 506 Ohm
MDC IDC MSMT LEADCHNL RV SENSING INTR AMPL: 8 mV
MDC IDC PG SERIAL: 60821694
MDC IDC SET LEADCHNL RA PACING AMPLITUDE: 2.5 V
MDC IDC SET LEADCHNL RV SENSING SENSITIVITY: 0.8 mV

## 2016-03-26 ENCOUNTER — Ambulatory Visit (INDEPENDENT_AMBULATORY_CARE_PROVIDER_SITE_OTHER): Payer: Medicare HMO | Admitting: *Deleted

## 2016-03-26 DIAGNOSIS — I4891 Unspecified atrial fibrillation: Secondary | ICD-10-CM

## 2016-03-26 DIAGNOSIS — Z5181 Encounter for therapeutic drug level monitoring: Secondary | ICD-10-CM | POA: Diagnosis not present

## 2016-03-26 LAB — PROTIME-INR
INR: 9.45 (ref 0.00–1.49)
PROTHROMBIN TIME: 72.5 s — AB (ref 11.6–15.2)

## 2016-03-26 LAB — POCT INR

## 2016-03-31 ENCOUNTER — Telehealth (HOSPITAL_COMMUNITY): Payer: Self-pay

## 2016-03-31 ENCOUNTER — Ambulatory Visit (INDEPENDENT_AMBULATORY_CARE_PROVIDER_SITE_OTHER): Payer: Medicare HMO | Admitting: *Deleted

## 2016-03-31 DIAGNOSIS — I4891 Unspecified atrial fibrillation: Secondary | ICD-10-CM | POA: Diagnosis not present

## 2016-03-31 DIAGNOSIS — Z5181 Encounter for therapeutic drug level monitoring: Secondary | ICD-10-CM | POA: Diagnosis not present

## 2016-03-31 LAB — POCT INR: INR: 3.4

## 2016-03-31 NOTE — Telephone Encounter (Signed)
Needs appt, but will have to have excess fluid removed by HD.

## 2016-03-31 NOTE — Telephone Encounter (Signed)
OK, he can let them know at dialysis that he is swelling and they can try to get more fluid off.

## 2016-03-31 NOTE — Telephone Encounter (Signed)
COumadin clinic called CHF clinic to informus that patient still has p-retty bad edema. Abd distended "much more than usual/baseline". BLEE +2-3. This RN just reached out to patient less than one hour ago to check in regarding these reported issues from [facility] last Friday, and patient stated these symptoms were much better and had pretty much resolved after HD over the weekend. However, is speaking with Coumadin clinic staff, this does not sound like the case. Will forward to MD to seek further guidance regarding this issue as patient is currently not prescribed any diuretics and does not keep up with weight at home.  Overdue for f/u apt with Dr. Aundra Dubin, canceled his March apt and never reshecudled with [place], will get apt for him as soon as possible as well.  Renee Pain

## 2016-03-31 NOTE — Telephone Encounter (Signed)
Dr. Aundra Dubin encouraged to get patient seen by CHF clinic due to abdominal swelling and BLEE +2-3 despite HD TIW; if not on his schedule due to limited openings, then with PA/NP on a time he is here. 1st available opening for PA/NP that patient can also attend due to work and HD conflicts was 1.5 weeks away from today's date, and at that time Dr. Aundra Dubin will be on vacation. Will forward this to Dr. Aundra Dubin to make him aware.

## 2016-03-31 NOTE — Telephone Encounter (Signed)
[facility] RN [facility] called and left VM on Friday afternoon after CHF clinic closed to report patient recently went out of town for a few days for his granddaughter's graduation, and at time of VM had BLEE, increased abd distention, SOB with min exertion, and decreased bilat breath sounds. Called this am to follow up with patient, states that he feels better after getting HD over the weekend, pulled of 2 L, swelling has gone down in legs and abd, no longer reports SOB. Patient denies any questions/concerns/needs at this time, and is on the way to his coumadin apt. Advised to call us if anything is needed from our end. Aware and agreeable.  Renee Pain

## 2016-04-03 ENCOUNTER — Encounter: Payer: Self-pay | Admitting: Vascular Surgery

## 2016-04-04 ENCOUNTER — Telehealth (HOSPITAL_COMMUNITY): Payer: Self-pay | Admitting: Surgery

## 2016-04-04 NOTE — Telephone Encounter (Signed)
Keisha with [facility] called and is concerned with his swelling and has fine crackles.  Per Jonni Sanger PA and previous note--fluid should be dealt with by dialysis and he should have an appt made.  He has an appt made on Wed June 14th at 0900--this information was given to [facility] Nurse.

## 2016-04-07 ENCOUNTER — Ambulatory Visit (INDEPENDENT_AMBULATORY_CARE_PROVIDER_SITE_OTHER): Payer: Medicare HMO

## 2016-04-07 ENCOUNTER — Encounter: Payer: Self-pay | Admitting: Cardiology

## 2016-04-07 DIAGNOSIS — I4891 Unspecified atrial fibrillation: Secondary | ICD-10-CM

## 2016-04-07 DIAGNOSIS — Z5181 Encounter for therapeutic drug level monitoring: Secondary | ICD-10-CM

## 2016-04-07 LAB — POCT INR: INR: 5.3

## 2016-04-08 NOTE — Progress Notes (Signed)
Patient ID: Tony Freeman, male   DOB: 19-Feb-1951, 65 y.o.   MRN: [id number]    Advanced Heart Failure Clinic Note   Cardiology: Dr. Aundra Freeman PCP: Dr. Tillman Freeman Nephrologist: Dr Tony Freeman  HPI: [name] is a 65 year old man with history of atrial fibrillation, CAD (s/p multiple stents) chronic combined CHF (echo 04/02/2014 with LV EF: 25-30%, diffuse hypokinesis), ischemic cardiomyopathy s/p dual chamber Biotronik ICD placement 06/29/2014, DM2, ESRD, asthma, HLD, abnormal renal US 03/2014 (followed by urology).  He has a h/o PCI LAD/RCA in 2007, PCI LAD/RCA in 2009 at [facility], abnormal lexiscan stress test 06/2013 moderate reversible perfusion abnormality in anterior, septal and apical region, then PCI to LAD 08/2013. [facility] records indicate that coronary angio at that time also showed CTO of RCA and nonobstructive disease in the LCx. Echo at [facility] at least back to 01/2013 showed EF 35-40%, then <25% in 01/4009 with diastolic dysfunction and elevated RVSP. During his 03/2014 admission at [facility], he was diagnosed with new onset atrial fib-vs-flutter. Echo showed false tendon in LV apex, moderate LVH, EF 25-30%, diffuse HK, mod LAE, trivial TR but no mention of pulm HTN. He also had one troponin that was 0.11 and the rest were negative, felt due to CKD and intracranial etiology with some encephalopathy. He was discharged on Coumadin (along with his Plavix that he was on from prior PCI/stroke). However, it's not totally clear what he did with the Coumadin as it was no longer on his list when he saw a cardiologist in the [facility] system at the end of June. The patient thinks his cardiologist took him off it but the note doesn't reflect that (the patient does have some cognitive difficulties since his stroke and lives with his son who manages his medicine). He went on to have ICD placement on 06/29/14. Reading their notes, it appears they were not aware of his 03/2014 diagnosis of atrial fibrillation until it  was picked up on his ICD interrogation in early September at which time he was placed on Edoxaban.   Admitted 07/16/14 with increased dyspnea. Found to be in atrial fibrillation with RVR. Had TEE/DC-CV. TEE showed EF 25%, RV mildly dilated and moderately to severely dysfunctional.  Successful cardioversion completed. He required IV lasix and short term milrinone. He transitioned to torsemide 40 mg twice a day. He remained on edoxaban. He was discharged on amiodarone 400 mg daily, carvedilol 6.25 mg twice a day, and hydralazine 50 mg tid/imdur 60 mg daily. Discharged weight was 204 pounds.   Cardiolite in 2/16 showed EF 31%, no ischemia.    He was admitted in 3/16 with acute bronchitis, hypercalcemia, and AKI with creatinine up to 5.5.   Bone marrow biopsy in 4/16 showed no significant abnormalities.     He seemed to do well until 7/16 when he had a cardiac arrest [place] (apparently PEA, had CPR and epinephrine).  He was admitted to [facility].  Coronary angiography was done, and he had PTCA to proximal LAD in-stent restenosis.  The RCA had a chronic total occlusion.  He developed AKI and progressed to ESRD, now on HD.  He went to SNF initially after hospitalization, now is home with his son.    He presents today for add on with complaints of increased fluid, despite dialysis. Has been having more edema in his legs up into his thighs.  Breathing has been stable, but can walk about a block before he has to stop (gets SOB sooner). Sleeps on 2  pillows chronically. Not in cardiac rehab maintenance program currently, would have to pay out of pocket which son says he is considering. No bleeding on warfarin despite several high INRs, last INR 5.3 and has held for several days.       EKG 12/18/15 Afib 70s EKG 04/09/16 NSR 72 bpm  ECHO 03/2014 EF 25-30%  ECHO 8/16 EF 15%, mild LV dilation, mild-moderate MR, severely decreased RV systolic function, PASP 63 mmHg.   Labs  07/21/14 TSH 0.413  10/15 K  3.5 => 4.3 Creatinine 3.86 => 3.96, Na 137  3/16 TSH normal 4/16 K 3.8, creatinine 4.62, HCT 28.8, AST/ALT normal 7/16 HCT 30 8/16 AST/ALT normal, TSH normal 10/16 HCT 32.5 12/16 AST/ALT normal, tbili 1.9, TSH normal  ROS: All systems negative except as listed in HPI, PMH and Problem List.  Past Medical History  Diagnosis Date  . Systolic and diastolic CHF, chronic ([place])     a. Echo at [facility] 11/17/2013 Ef <25%, severe hypokinesis of LV, moderately dilated LA, grade IV/IV diastolic dysfunction with irreversible restrictive pattern of mitral inflow, severe pulm HTN (RVSP >74mHg), 1-2+ MR  [facility] EF 25-30%, moderate concentric LVH, diffuse hypokinesis, moderately dilated LA, no significant MR observed  . CAD (coronary artery disease)     a. PCI LAD/RCA in 2007  b. [facility]. PCI LAD 08/2013, CTO of RCA and nonobstructive disease in the LCx.  .Marland KitchenHyperlipidemia   . Dementia   . Ischemic cardiomyopathy     a. s/p Biotronik ICD 06/2014.  .Marland KitchenRetinopathy     bilateral  . HTN (hypertension)   . Abnormal renal ultrasound     a. 03/2014: Abnormal renal ultrasound with left renal lesion and bladder wall thickening and microscopic hematuria. Multiple cysts on prior MRI. Given inability to use contrast, f/u imaging limited, repeat [place] with outpatient uro f/u.  .Marland KitchenParoxysmal atrial fibrillation (HCC)   . Paroxysmal atrial flutter ([place]   . Anemia   . Myocardial infarction ([facility] July 2016    Heart attack  . Peripheral vascular disease ([facility]   . Stroke ([facility]     right arm weakness  . Asthma   . CKD (chronic kidney disease), stage IV ([place]     on dialysis T/TH/Sa  . 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 H/O cardiac arrest 04/2015  . Constipation   . AICD (automatic cardioverter/defibrillator) present   . ESRD (end stage renal disease) on dialysis ([place]   . Pneumonia     when he was younger, March 2017    Current Outpatient  Prescriptions  Medication Sig Dispense Refill  . acetaminophen (TYLENOL) 325 MG tablet Take 650 mg by mouth every 8 (eight) hours as needed for headache (pain).     .Marland Kitchenalbuterol (PROVENTIL HFA;VENTOLIN HFA) 108 (90 BASE) MCG/ACT inhaler Inhale 2 puffs into the lungs every 6 (six) hours as needed for wheezing or shortness of breath.    .Marland Kitchenalbuterol (PROVENTIL) (2.5 MG/3ML) 0.083% nebulizer solution Take 2.5 mg by nebulization every 6 (six) hours as needed for wheezing or shortness of breath.    .Marland Kitchenamiodarone (PACERONE) 200 MG tablet Take 0.5 tablets (100 mg total) by mouth daily. 15 tablet 3  . atorvastatin (LIPITOR) 40 MG tablet TAKE ONE TABLET BY MOUTH ONCE DAILY 30 tablet 8  . clopidogrel (PLAVIX) 75 MG tablet TAKE ONE TABLET BY MOUTH ONCE DAILY 30 tablet  6  . docusate sodium (COLACE) 100 MG capsule Take 100 mg by mouth daily as needed (constipation).     . insulin lispro (HUMALOG) 100 UNIT/ML injection Inject 10 Units into the skin 2 (two) times daily as needed for high blood sugar (CBG >180). Reported on 01/10/2016    . mometasone-formoterol (DULERA) 200-5 MCG/ACT AERO Inhale 2 puffs into the lungs 2 (two) times daily as needed for wheezing or shortness of breath.    . Multiple Vitamin (MULTIVITAMIN WITH MINERALS) TABS tablet Take 1 tablet by mouth daily.    . nitroGLYCERIN (NITROSTAT) 0.4 MG SL tablet Place 1 tablet (0.4 mg total) under the tongue every 5 (five) minutes as needed for chest pain. 30 tablet 1  . nystatin ointment (MYCOSTATIN) Apply 1 application topically as needed (for rash).     . warfarin (COUMADIN) 5 MG tablet TAKE ONE TABLET BY MOUTH AS DIRECTED 30 tablet 3   No current facility-administered medications for this encounter.   Facility-Administered Medications Ordered in Other Encounters  Medication Dose Route Frequency Provider Last Rate Last Dose  . 0.9 %  sodium chloride infusion   Intravenous Continuous Angelia Mould, MD   Stopped at 02/12/16 [phone]  . Chlorhexidine  Gluconate Cloth 2 % PADS 6 each  6 each Topical Once Angelia Mould, MD      . insulin aspart (novoLOG) injection 10 Units  10 Units Intravenous Once Angelia Mould, MD       PHYSICAL EXAM: Filed Vitals:   04/09/16 0904  BP: 96/70  Pulse: 80  Weight: 176 lb 3.2 oz (79.924 kg)  SpO2: 93%    Wt Readings from Last 3 Encounters:  04/09/16 176 lb 3.2 oz (79.924 kg)  03/14/16 177 lb (80.287 kg)  03/03/16 180 lb (81.647 kg)    General:  NAD HEENT: normal Neck: supple. JVP ~11-12 cm. Carotids 2+ bilaterally; no bruits. No thyromegaly or nodule noted.  Cor: PMI normal. Irregularly irregular. No rubs, gallops. 2/6 HSM LLSB.   Lungs: Mild basilar crackles Abdomen: soft, NT, mild distention, no HSM. No bruits or masses. +BS  Extremities: no cyanosis, clubbing, rash.  1-2+ edema into thighs    Neuro: alert & orientedx3, cranial nerves grossly intact. Moves all 4 extremities w/o difficulty. Affect pleasant.  ASSESSMENT & PLAN: 1. Chronic systolic HF: Ischemic cardiomyopathy. Has Biotronik ICD. ECHO 11/29/15 EF 15-20%, severe RV systolic function. -  NYHA class II symptoms.  He has ongoing marked volume overload on exam.  Needs to have managed by dialysis.   - Change Coreg to 3.125 mg BID. Has been taking 6.25 mg daily for some time. No room to up-titrate with soft BP.   2. Atrial fibrillation: Paroxysmal.  In 9/15, had TEE with no LAA thrombus and successful DC-CV.   - s/p repeat TEE/DCCV 01/02/16 w - ECG today with NSR 72 bpm - INR per coumadin clinic.   - Continue amiodarone 100 mg daily - Recent transaminases and TSH normal. He will need regular eye exams on amiodarone. - Continue warfarin, aim for INR 2-2.5 while on Plavix.  3. CAD: Cardiac arrest 7/16 with cath and cutting balloon angioplasty to proximal LAD in-stent restenosis.   - Continue Plavix/warfarin for 1 year as long as he tolerates then likely stop Plavix in 7/17. - Continue lipitor 40 mg daily. Lipids stable  10/2015 4. ESRD:  - HD dependent. - He is still having problems with excess fluid.  Unfortunately we have little to offer from HF standpoint.  This needs to be managed by dialysis.   5. HTN - Managed in the setting of HF medications. Continue current meds as above.  Too soft to up-titrate today. 6. Pancreatic fullness/mass:   - Noted on [place] 11/02/15. Normal pancreas without mass by CT 11/21/15.  Difficult situation.  Fluid needs to be managed by dialysis, but with his significantly reduced EF, unlikely to tolerate large amounts of increase dialysis.  Will message Dr Tony Freeman, his Renal MD for further. Will not increase coreg with his soft BP,   Shirley Friar, PA-C 04/09/2016  Total time spent > 25 minutes, over half that spent discussing the above.

## 2016-04-09 ENCOUNTER — Ambulatory Visit (INDEPENDENT_AMBULATORY_CARE_PROVIDER_SITE_OTHER): Payer: Medicare HMO | Admitting: Vascular Surgery

## 2016-04-09 ENCOUNTER — Ambulatory Visit (HOSPITAL_COMMUNITY)
Admission: RE | Admit: 2016-04-09 | Discharge: 2016-04-09 | Disposition: A | Discharge status: Home / Self Care | Payer: Medicare HMO | Source: Ambulatory Visit | Attending: Vascular Surgery | Admitting: Vascular Surgery

## 2016-04-09 ENCOUNTER — Encounter: Payer: Self-pay | Admitting: Vascular Surgery

## 2016-04-09 ENCOUNTER — Telehealth (HOSPITAL_COMMUNITY): Payer: Self-pay | Admitting: *Deleted

## 2016-04-09 ENCOUNTER — Ambulatory Visit (HOSPITAL_BASED_OUTPATIENT_CLINIC_OR_DEPARTMENT_OTHER)
Admission: RE | Admit: 2016-04-09 | Discharge: 2016-04-09 | Disposition: A | Discharge status: Home / Self Care | Payer: Medicare HMO | Source: Ambulatory Visit | Attending: Internal Medicine | Admitting: Internal Medicine

## 2016-04-09 VITALS — BP 96/70 | HR 80 | Wt 176.2 lb

## 2016-04-09 VITALS — BP 127/65 | HR 70 | Ht 66.0 in | Wt 177.4 lb

## 2016-04-09 DIAGNOSIS — Z992 Dependence on renal dialysis: Secondary | ICD-10-CM

## 2016-04-09 DIAGNOSIS — N186 End stage renal disease: Secondary | ICD-10-CM | POA: Diagnosis not present

## 2016-04-09 DIAGNOSIS — I480 Paroxysmal atrial fibrillation: Secondary | ICD-10-CM

## 2016-04-09 DIAGNOSIS — Z4931 Encounter for adequacy testing for hemodialysis: Secondary | ICD-10-CM | POA: Diagnosis not present

## 2016-04-09 DIAGNOSIS — E1122 Type 2 diabetes mellitus with diabetic chronic kidney disease: Secondary | ICD-10-CM | POA: Insufficient documentation

## 2016-04-09 DIAGNOSIS — I132 Hypertensive heart and chronic kidney disease with heart failure and with stage 5 chronic kidney disease, or end stage renal disease: Secondary | ICD-10-CM | POA: Insufficient documentation

## 2016-04-09 DIAGNOSIS — I2510 Atherosclerotic heart disease of native coronary artery without angina pectoris: Secondary | ICD-10-CM | POA: Diagnosis not present

## 2016-04-09 DIAGNOSIS — E785 Hyperlipidemia, unspecified: Secondary | ICD-10-CM | POA: Diagnosis not present

## 2016-04-09 DIAGNOSIS — N185 Chronic kidney disease, stage 5: Secondary | ICD-10-CM

## 2016-04-09 DIAGNOSIS — E11319 Type 2 diabetes mellitus with unspecified diabetic retinopathy without macular edema: Secondary | ICD-10-CM | POA: Diagnosis not present

## 2016-04-09 DIAGNOSIS — I5022 Chronic systolic (congestive) heart failure: Secondary | ICD-10-CM

## 2016-04-09 DIAGNOSIS — N184 Chronic kidney disease, stage 4 (severe): Secondary | ICD-10-CM

## 2016-04-09 DIAGNOSIS — E1121 Type 2 diabetes mellitus with diabetic nephropathy: Secondary | ICD-10-CM | POA: Insufficient documentation

## 2016-04-09 DIAGNOSIS — I5043 Acute on chronic combined systolic (congestive) and diastolic (congestive) heart failure: Secondary | ICD-10-CM

## 2016-04-09 DIAGNOSIS — I10 Essential (primary) hypertension: Secondary | ICD-10-CM

## 2016-04-09 DIAGNOSIS — I5042 Chronic combined systolic (congestive) and diastolic (congestive) heart failure: Secondary | ICD-10-CM | POA: Insufficient documentation

## 2016-04-09 MED ORDER — CARVEDILOL 3.125 MG PO TABS
3.1250 mg | ORAL_TABLET | Freq: Every day | ORAL | Status: DC
Start: 2016-04-09 — End: 2016-04-11

## 2016-04-09 NOTE — Telephone Encounter (Signed)
Pt was seen this AM and advised to decrease Carvedilol to 3.125 mg BID, from 6.25 mg BID.  However when he got home he realized his HD MD had stopped Carvedilol a while back so he has not been on it.  He wants to know if he should restart at 3.125 mg BID or stay off of med.  Will send to Oda Kilts, PA for review and call him back.

## 2016-04-09 NOTE — Telephone Encounter (Signed)
OK to stop per Renal.    Beryle Beams" [place], PA-C 04/09/2016 4:11 PM

## 2016-04-09 NOTE — Patient Instructions (Addendum)
CHANGE Carvedilol to 3.125 mg, one tab twice a day Your physician recommends that you schedule a follow-up appointment in: 2 months with Dr. Aundra Dubin  Do the following things EVERYDAY: 1) Weigh yourself in the morning before breakfast. Write it down and keep it in a log. 2) Take your medicines as prescribed 3) Eat low salt foods-Limit salt (sodium) to 2000 mg per day.  4) Stay as active as you can everyday 5) Limit all fluids for the day to less than 2 liters 6)

## 2016-04-09 NOTE — Progress Notes (Signed)
POST OPERATIVE OFFICE NOTE    CC:  F/u for surgery  HPI:  This is a 65 y.o. male who is s/p right BC AVF 02/12/16 after the left BC AVF did not mature.  He did undergo a fistulogram, which reveled a central venouis occlusion and is not a candidate for any further access in the left arm.    He states he does not have any pain in his right hand and does not have steal sx.  He states he dialyzes via a right IJ catheter, which is unclear who placed it and when.  The pt is right handed.  He dialyzes on T/T/S.  He is on coumadin.    No Known Allergies  Current Outpatient Prescriptions  Medication Sig Dispense Refill  . acetaminophen (TYLENOL) 325 MG tablet Take 650 mg by mouth every 8 (eight) hours as needed for headache (pain).     Marland Kitchen albuterol (PROVENTIL HFA;VENTOLIN HFA) 108 (90 BASE) MCG/ACT inhaler Inhale 2 puffs into the lungs every 6 (six) hours as needed for wheezing or shortness of breath.    Marland Kitchen albuterol (PROVENTIL) (2.5 MG/3ML) 0.083% nebulizer solution Take 2.5 mg by nebulization every 6 (six) hours as needed for wheezing or shortness of breath.    Marland Kitchen amiodarone (PACERONE) 200 MG tablet Take 0.5 tablets (100 mg total) by mouth daily. 15 tablet 3  . atorvastatin (LIPITOR) 40 MG tablet TAKE ONE TABLET BY MOUTH ONCE DAILY 30 tablet 8  . carvedilol (COREG) 3.125 MG tablet Take 1 tablet (3.125 mg total) by mouth daily. 60 tablet 3  . clopidogrel (PLAVIX) 75 MG tablet TAKE ONE TABLET BY MOUTH ONCE DAILY 30 tablet 6  . docusate sodium (COLACE) 100 MG capsule Take 100 mg by mouth daily as needed (constipation).     . insulin lispro (HUMALOG) 100 UNIT/ML injection Inject 10 Units into the skin 2 (two) times daily as needed for high blood sugar (CBG >180). Reported on 04/09/2016    . mometasone-formoterol (DULERA) 200-5 MCG/ACT AERO Inhale 2 puffs into the lungs 2 (two) times daily as needed for wheezing or shortness of breath.    . Multiple Vitamin (MULTIVITAMIN WITH MINERALS) TABS tablet Take 1  tablet by mouth daily.    . nitroGLYCERIN (NITROSTAT) 0.4 MG SL tablet Place 1 tablet (0.4 mg total) under the tongue every 5 (five) minutes as needed for chest pain. (Patient not taking: Reported on 04/09/2016) 30 tablet 1  . nystatin ointment (MYCOSTATIN) Apply 1 application topically as needed (for rash).     . warfarin (COUMADIN) 5 MG tablet TAKE ONE TABLET BY MOUTH AS DIRECTED 30 tablet 3   No current facility-administered medications for this visit.   Facility-Administered Medications Ordered in Other Visits  Medication Dose Route Frequency Provider Last Rate Last Dose  . 0.9 %  sodium chloride infusion   Intravenous Continuous Angelia Mould, MD   Stopped at 02/12/16 [phone]  . Chlorhexidine Gluconate Cloth 2 % PADS 6 each  6 each Topical Once Angelia Mould, MD      . insulin aspart (novoLOG) injection 10 Units  10 Units Intravenous Once Angelia Mould, MD         ROS:  See HPI  Physical Exam:  Filed Vitals:   04/09/16 1347  BP: 127/65  Pulse: 70    Incision:  Well healed Extremities:  Good grip right hand.  Unable to palpate right radial pulse.  +thrill/bruit within the fistula.  It is mildly tortuous. It is very  easily palpable and superficial.     Dialysis access duplex 04/09/16: 0.31-0.78cm in diameter 0.15cm-0.37cm depth. --Widely patent right BC AVF without evidence of stenosis.  One brance proximal to the anastomosis and another 2 at shoulder level.  Assessment/Plan:  This is a 65 y.o. male who is s/p: Right BC AVF on 02/12/16 by Dr. Scot Dock  -pt's fistula is slowly maturing.  He does have a competing branch, which may be beneficial to ligate if it continues not to mature.  -he is on coumadin and would need to be off of this.   -continue dialysis via right IJ tunneled catheter -f/u in 6 weeks with access duplex and re-evaluation of his fistula.     Leontine Locket, PA-C [facility] [phone]  Clinic MD:  Pt seen and  examined with Dr. Scot Dock  I have interviewed the patient and examined the patient. I agree with the findings by the PA.  I think that it would be reasonable to give the fistula more time to enlarge. There does not appear to be any focal area of stenosis amenable to venoplasty. There is one competing branch that we could consider ligating if the fistula does not continue to enlarge. He'll get a duplex in 6 weeks and will make a decision about the need for fistulogram and/or ligation of the competing branch at that time.  Gae Gallop, MD [phone]

## 2016-04-10 NOTE — Addendum Note (Signed)
Addended by: Mena Goes on: 04/10/2016 04:10 PM   Modules accepted: Orders

## 2016-04-10 NOTE — Telephone Encounter (Signed)
Left message to call back  

## 2016-04-11 NOTE — Telephone Encounter (Signed)
Pt aware and agreeable.  

## 2016-04-14 ENCOUNTER — Ambulatory Visit (INDEPENDENT_AMBULATORY_CARE_PROVIDER_SITE_OTHER): Payer: Medicare HMO | Admitting: *Deleted

## 2016-04-14 DIAGNOSIS — Z5181 Encounter for therapeutic drug level monitoring: Secondary | ICD-10-CM

## 2016-04-14 DIAGNOSIS — I4891 Unspecified atrial fibrillation: Secondary | ICD-10-CM

## 2016-04-14 LAB — POCT INR: INR: 1.8

## 2016-04-17 ENCOUNTER — Ambulatory Visit (INDEPENDENT_AMBULATORY_CARE_PROVIDER_SITE_OTHER)
Admission: RE | Admit: 2016-04-17 | Discharge: 2016-04-17 | Disposition: A | Discharge status: Home / Self Care | Payer: Medicare HMO | Source: Ambulatory Visit | Attending: Internal Medicine | Admitting: Internal Medicine

## 2016-04-17 ENCOUNTER — Encounter: Payer: Self-pay | Admitting: Internal Medicine

## 2016-04-17 ENCOUNTER — Ambulatory Visit (INDEPENDENT_AMBULATORY_CARE_PROVIDER_SITE_OTHER): Payer: Medicare HMO | Admitting: Internal Medicine

## 2016-04-17 VITALS — BP 104/62 | HR 76 | Ht 66.0 in | Wt 175.0 lb

## 2016-04-17 DIAGNOSIS — J948 Other specified pleural conditions: Secondary | ICD-10-CM

## 2016-04-17 DIAGNOSIS — J90 Pleural effusion, not elsewhere classified: Secondary | ICD-10-CM

## 2016-04-17 NOTE — Progress Notes (Signed)
Subjective:    Patient ID: Tony Freeman, male    DOB: [birth year],     MRN: [id number]    Brief patient profile:  65 yobm never smoker with known severe systolic dysfunction and ESRF on HD T-th-sat referred to pulmonary clinic 03/03/2016 by Tony Freeman for R >> L effusions     History of Present Illness  03/04/2016 1st [facility] office visit/ Tony Freeman   Chief Complaint  Patient presents with  . pulmonary consult    pt c/o occ SOB, non prod cough, occ chest pain X42mo. denies wheezing or CP  indolent onset sob > one month min progressive no more sob Monday pm off 2 HD and no better breathing after HD assoc with obvious peripheral swelling and ascites documented on CT 02/05/16 with findings of R effusion dating back to 01/24/2015 and assoc with sev pillow orthopnea but no cp rec R thoracentesis >>Dates back to at least 01/24/15 by cxr  CT Chest 02/05/16 Moderate bilateral pleural effusions, right larger than left with compressive atelectasis in the lower lobes and Cardiomegaly.   Ascites around the liver and spleen, similar to prior CT abdomen. - US thoracentesis  03/07/16 >  1. 6 liters  Borderline exudate by prot 3.8/ wbc 204 M> L > P neg / cyt neg     03/14/2016  f/u ov/Tony Freeman re: bilateral effusions  Chief Complaint  Patient presents with  . Follow-up    Breathing is unchanged. He also c/o non prod cough.   much better p t centesis with doe but about 50% back to where he was with sob w/in a few days of the procedure and no better in terms of being able to lie flat, though never really tried even the next day or two after the procedure rec Keep drier if tolerated    04/17/2016  f/u ov/Tony Freeman re: f/u chronic R effusion/ borderline features  Chief Complaint  Patient presents with  . Follow-up    CXR done today. Breathing is unchanged. He is coughing more over the past few days- non prod.   no change orthopnea / min dry cough/ overall breathing better than baseline when we  first saw him here  No obvious day to day or daytime variability or assoc excess/ purulent sputum or mucus plugs or hemoptysis or cp or chest tightness, subjective wheeze or overt sinus or hb symptoms. No unusual exp hx or h/o childhood pna/ asthma or knowledge of premature birth.  Sleeping ok without nocturnal  or early am exacerbation  of respiratory  c/o's or need for noct saba. Also denies any obvious fluctuation of symptoms with weather or environmental changes or other aggravating or alleviating factors except as outlined above   Current Medications, Allergies, Complete Past Medical History, Past Surgical History, Family History, and Social History were reviewed in [employer] record.  ROS  The following are not active complaints unless bolded sore throat, dysphagia, dental problems, itching, sneezing,  nasal congestion or excess/ purulent secretions, ear ache,   fever, chills, sweats, unintended wt loss, classically pleuritic or exertional cp,  orthopnea pnd or leg swelling, presyncope, palpitations, abdominal pain, anorexia, nausea, vomiting, diarrhea  or change in bowel or bladder habits, change in stools or urine, dysuria,hematuria,  rash, arthralgias, visual complaints, headache, numbness, weakness or ataxia or problems with walking or coordination,  change in mood/affect or memory.  Bilateral painful leg paresthesias worse x months no longer resp to tylenol  Objective:   Physical Exam  amb bm nad   04/17/2016        175  03/14/2016       177   03/03/16 180 lb (81.647 kg)  02/12/16 182 lb (82.555 kg)  01/19/16 182 lb 5.1 oz (82.7 kg)    Vital signs reviewed  HEENT: nl dentition, turbinates, and oropharynx. Nl external ear canals without cough reflex   NECK :  without JVD/Nodes/TM/ nl carotid upstrokes bilaterally   LUNGS: no acc muscle use,  Mild decrease in bs with dullness at R base only  , L lung clear to a and P x minimal decrease bs  at base  CV:  RRR  no s3 or murmur or increase in P2,  2+ pitting edema both legs  ABD:  Mod distended/ shifting dullness noted/ but soft and nontender with nl inspiratory excursion in the supine position. No bruits or organomegaly, bowel sounds nl  MS:  Nl gait/ ext warm without deformities, calf tenderness, cyanosis or clubbing No obvious joint restrictions   SKIN: warm and dry without lesions    NEURO:  alert, slowed mentation but knows day of week,   no motor deficits       CXR PA and Lateral:   04/17/2016 :    I personally reviewed images and agree with radiology impression as follows:    1. Dual-lumen right IJ catheter in stable position .  2. Cardiac pacer in stable position. Coronary artery disease. Cardiomegaly with bibasilar atelectasis and/or infiltrates and bilateral pleural effusions unchanged. My review: clearly better R effusion esp noted on lateral view and the L is almost gone     Assessment & Plan:

## 2016-04-17 NOTE — Assessment & Plan Note (Signed)
Dates back to at least 01/24/15 by cxr  Echo 01/02/16 - Left ventricle: Mildly dilated LV with mild LV hypertrophy. EF  15-20% with diffuse hypokinesis. D-shaped interventricular septum  suggestive of RV pressure/volume overload. No evidence of  thrombus. - Aortic valve: There was no stenosis. - Aorta: Grade III plaque in the descending thoracic aorta, normal  caliber aorta. - Mitral valve: There was trivial regurgitation. - Left atrium: Moderate left atrial enlargement CT Chest 02/05/16 Moderate bilateral pleural effusions, right larger than left with compressive atelectasis in the lower lobes and Cardiomegaly.   Ascites around the liver and spleen, similar to prior CT abdomen. - US thoracentesis  03/07/16 >  1. 6 liters  Borderline exudate by prot 3.8/ wbc 204 M> L > P neg / cyt neg  - Repeat cxr 03/14/2016 >   effusion back about 50% of pre tap - Repeat cxr 04/17/2016 > improved s interim tap.  I had an extended final summary discussion with the patient/son reviewing all relevant studies completed to date and  lasting 15 to 20 minutes of a 25 minute visit on the following issues:    Clearly the lung, abd cavity,and lower ext are all "innocent bystanders" from CRF/ chf and nothing else to offer in this clinic.  I doubt he'd do well with R pleurex but if the effusion recurs/worsens despite continuing to pull him as neg as possible would consider T surgery eval for this pupose.

## 2016-04-17 NOTE — Patient Instructions (Signed)
The fluid in lungs is better but you will need to continue to pull fluid off in dialysis as much as you can.  Mention your leg pains to your  kidney doctor and your diabetes doctor   Pulmonary follow up is as needed

## 2016-04-24 ENCOUNTER — Emergency Department (HOSPITAL_COMMUNITY): Payer: Medicare HMO

## 2016-04-24 ENCOUNTER — Inpatient Hospital Stay (HOSPITAL_COMMUNITY)
Admission: EM | Admit: 2016-04-24 | Discharge: 2016-05-02 | DRG: 291 | Disposition: A | Discharge status: Skilled Nursing Facility | Payer: Medicare HMO | Attending: Internal Medicine | Admitting: Internal Medicine

## 2016-04-24 ENCOUNTER — Encounter (HOSPITAL_COMMUNITY): Payer: Self-pay | Admitting: *Deleted

## 2016-04-24 ENCOUNTER — Inpatient Hospital Stay (HOSPITAL_COMMUNITY): Payer: Medicare HMO

## 2016-04-24 DIAGNOSIS — I132 Hypertensive heart and chronic kidney disease with heart failure and with stage 5 chronic kidney disease, or end stage renal disease: Principal | ICD-10-CM | POA: Diagnosis present

## 2016-04-24 DIAGNOSIS — E1122 Type 2 diabetes mellitus with diabetic chronic kidney disease: Secondary | ICD-10-CM | POA: Diagnosis present

## 2016-04-24 DIAGNOSIS — M79606 Pain in leg, unspecified: Secondary | ICD-10-CM | POA: Diagnosis present

## 2016-04-24 DIAGNOSIS — I480 Paroxysmal atrial fibrillation: Secondary | ICD-10-CM | POA: Diagnosis present

## 2016-04-24 DIAGNOSIS — E11319 Type 2 diabetes mellitus with unspecified diabetic retinopathy without macular edema: Secondary | ICD-10-CM | POA: Diagnosis present

## 2016-04-24 DIAGNOSIS — E1151 Type 2 diabetes mellitus with diabetic peripheral angiopathy without gangrene: Secondary | ICD-10-CM | POA: Diagnosis present

## 2016-04-24 DIAGNOSIS — J441 Chronic obstructive pulmonary disease with (acute) exacerbation: Secondary | ICD-10-CM | POA: Diagnosis present

## 2016-04-24 DIAGNOSIS — J189 Pneumonia, unspecified organism: Secondary | ICD-10-CM | POA: Diagnosis present

## 2016-04-24 DIAGNOSIS — Y95 Nosocomial condition: Secondary | ICD-10-CM | POA: Diagnosis present

## 2016-04-24 DIAGNOSIS — N390 Urinary tract infection, site not specified: Secondary | ICD-10-CM | POA: Diagnosis present

## 2016-04-24 DIAGNOSIS — D72829 Elevated white blood cell count, unspecified: Secondary | ICD-10-CM | POA: Diagnosis present

## 2016-04-24 DIAGNOSIS — R600 Localized edema: Secondary | ICD-10-CM | POA: Diagnosis present

## 2016-04-24 DIAGNOSIS — D696 Thrombocytopenia, unspecified: Secondary | ICD-10-CM | POA: Diagnosis not present

## 2016-04-24 DIAGNOSIS — I255 Ischemic cardiomyopathy: Secondary | ICD-10-CM | POA: Diagnosis present

## 2016-04-24 DIAGNOSIS — F0390 Unspecified dementia without behavioral disturbance: Secondary | ICD-10-CM | POA: Diagnosis present

## 2016-04-24 DIAGNOSIS — J9811 Atelectasis: Secondary | ICD-10-CM | POA: Diagnosis present

## 2016-04-24 DIAGNOSIS — E1165 Type 2 diabetes mellitus with hyperglycemia: Secondary | ICD-10-CM | POA: Diagnosis present

## 2016-04-24 DIAGNOSIS — I2510 Atherosclerotic heart disease of native coronary artery without angina pectoris: Secondary | ICD-10-CM | POA: Diagnosis present

## 2016-04-24 DIAGNOSIS — R609 Edema, unspecified: Secondary | ICD-10-CM

## 2016-04-24 DIAGNOSIS — I69331 Monoplegia of upper limb following cerebral infarction affecting right dominant side: Secondary | ICD-10-CM | POA: Diagnosis not present

## 2016-04-24 DIAGNOSIS — R52 Pain, unspecified: Secondary | ICD-10-CM

## 2016-04-24 DIAGNOSIS — J9601 Acute respiratory failure with hypoxia: Secondary | ICD-10-CM | POA: Diagnosis present

## 2016-04-24 DIAGNOSIS — K7460 Unspecified cirrhosis of liver: Secondary | ICD-10-CM | POA: Diagnosis present

## 2016-04-24 DIAGNOSIS — I5043 Acute on chronic combined systolic (congestive) and diastolic (congestive) heart failure: Secondary | ICD-10-CM | POA: Diagnosis present

## 2016-04-24 DIAGNOSIS — E1140 Type 2 diabetes mellitus with diabetic neuropathy, unspecified: Secondary | ICD-10-CM | POA: Diagnosis present

## 2016-04-24 DIAGNOSIS — L03116 Cellulitis of left lower limb: Secondary | ICD-10-CM | POA: Diagnosis present

## 2016-04-24 DIAGNOSIS — E875 Hyperkalemia: Secondary | ICD-10-CM | POA: Diagnosis present

## 2016-04-24 DIAGNOSIS — Z95810 Presence of automatic (implantable) cardiac defibrillator: Secondary | ICD-10-CM | POA: Diagnosis not present

## 2016-04-24 DIAGNOSIS — E785 Hyperlipidemia, unspecified: Secondary | ICD-10-CM | POA: Diagnosis present

## 2016-04-24 DIAGNOSIS — N4889 Other specified disorders of penis: Secondary | ICD-10-CM | POA: Insufficient documentation

## 2016-04-24 DIAGNOSIS — Z7902 Long term (current) use of antithrombotics/antiplatelets: Secondary | ICD-10-CM

## 2016-04-24 DIAGNOSIS — R7989 Other specified abnormal findings of blood chemistry: Secondary | ICD-10-CM

## 2016-04-24 DIAGNOSIS — Z515 Encounter for palliative care: Secondary | ICD-10-CM | POA: Diagnosis not present

## 2016-04-24 DIAGNOSIS — Z833 Family history of diabetes mellitus: Secondary | ICD-10-CM

## 2016-04-24 DIAGNOSIS — Z6827 Body mass index (BMI) 27.0-27.9, adult: Secondary | ICD-10-CM

## 2016-04-24 DIAGNOSIS — N186 End stage renal disease: Secondary | ICD-10-CM | POA: Diagnosis present

## 2016-04-24 DIAGNOSIS — IMO0002 Reserved for concepts with insufficient information to code with codable children: Secondary | ICD-10-CM | POA: Diagnosis present

## 2016-04-24 DIAGNOSIS — Z992 Dependence on renal dialysis: Secondary | ICD-10-CM | POA: Diagnosis not present

## 2016-04-24 DIAGNOSIS — I252 Old myocardial infarction: Secondary | ICD-10-CM

## 2016-04-24 DIAGNOSIS — I5023 Acute on chronic systolic (congestive) heart failure: Secondary | ICD-10-CM

## 2016-04-24 DIAGNOSIS — R188 Other ascites: Secondary | ICD-10-CM | POA: Diagnosis present

## 2016-04-24 DIAGNOSIS — J440 Chronic obstructive pulmonary disease with acute lower respiratory infection: Secondary | ICD-10-CM | POA: Diagnosis present

## 2016-04-24 DIAGNOSIS — M79609 Pain in unspecified limb: Secondary | ICD-10-CM | POA: Diagnosis not present

## 2016-04-24 DIAGNOSIS — M79605 Pain in left leg: Secondary | ICD-10-CM | POA: Insufficient documentation

## 2016-04-24 DIAGNOSIS — L8990 Pressure ulcer of unspecified site, unspecified stage: Secondary | ICD-10-CM | POA: Insufficient documentation

## 2016-04-24 DIAGNOSIS — E1121 Type 2 diabetes mellitus with diabetic nephropathy: Secondary | ICD-10-CM | POA: Diagnosis present

## 2016-04-24 DIAGNOSIS — E1129 Type 2 diabetes mellitus with other diabetic kidney complication: Secondary | ICD-10-CM | POA: Diagnosis present

## 2016-04-24 DIAGNOSIS — M79602 Pain in left arm: Secondary | ICD-10-CM | POA: Diagnosis not present

## 2016-04-24 DIAGNOSIS — E8770 Fluid overload, unspecified: Secondary | ICD-10-CM | POA: Diagnosis present

## 2016-04-24 DIAGNOSIS — I481 Persistent atrial fibrillation: Secondary | ICD-10-CM | POA: Diagnosis not present

## 2016-04-24 DIAGNOSIS — A419 Sepsis, unspecified organism: Secondary | ICD-10-CM

## 2016-04-24 DIAGNOSIS — J90 Pleural effusion, not elsewhere classified: Secondary | ICD-10-CM | POA: Diagnosis present

## 2016-04-24 DIAGNOSIS — I4891 Unspecified atrial fibrillation: Secondary | ICD-10-CM | POA: Diagnosis present

## 2016-04-24 DIAGNOSIS — Z7901 Long term (current) use of anticoagulants: Secondary | ICD-10-CM

## 2016-04-24 DIAGNOSIS — I482 Chronic atrial fibrillation: Secondary | ICD-10-CM

## 2016-04-24 DIAGNOSIS — E663 Overweight: Secondary | ICD-10-CM | POA: Diagnosis present

## 2016-04-24 DIAGNOSIS — Z7189 Other specified counseling: Secondary | ICD-10-CM | POA: Diagnosis not present

## 2016-04-24 DIAGNOSIS — Z794 Long term (current) use of insulin: Secondary | ICD-10-CM

## 2016-04-24 DIAGNOSIS — L89892 Pressure ulcer of other site, stage 2: Secondary | ICD-10-CM | POA: Diagnosis present

## 2016-04-24 DIAGNOSIS — R0902 Hypoxemia: Secondary | ICD-10-CM | POA: Insufficient documentation

## 2016-04-24 DIAGNOSIS — Z09 Encounter for follow-up examination after completed treatment for conditions other than malignant neoplasm: Secondary | ICD-10-CM

## 2016-04-24 DIAGNOSIS — D631 Anemia in chronic kidney disease: Secondary | ICD-10-CM | POA: Diagnosis present

## 2016-04-24 DIAGNOSIS — J811 Chronic pulmonary edema: Secondary | ICD-10-CM

## 2016-04-24 DIAGNOSIS — Z8674 Personal history of sudden cardiac arrest: Secondary | ICD-10-CM

## 2016-04-24 DIAGNOSIS — Z8249 Family history of ischemic heart disease and other diseases of the circulatory system: Secondary | ICD-10-CM

## 2016-04-24 LAB — COMPREHENSIVE METABOLIC PANEL
ALT: 20 U/L (ref 17–63)
AST: 25 U/L (ref 15–41)
Albumin: 3 g/dL — ABNORMAL LOW (ref 3.5–5.0)
Alkaline Phosphatase: 145 U/L — ABNORMAL HIGH (ref 38–126)
Anion gap: 15 (ref 5–15)
BILIRUBIN TOTAL: 1.8 mg/dL — AB (ref 0.3–1.2)
BUN: 75 mg/dL — AB (ref 6–20)
CHLORIDE: 96 mmol/L — AB (ref 101–111)
CO2: 19 mmol/L — ABNORMAL LOW (ref 22–32)
CREATININE: 7.02 mg/dL — AB (ref 0.61–1.24)
Calcium: 9.8 mg/dL (ref 8.9–10.3)
GFR calc Af Amer: 9 mL/min — ABNORMAL LOW (ref >60)
GFR, EST NON AFRICAN AMERICAN: 7 mL/min — AB (ref >60)
Glucose, Bld: 113 mg/dL — ABNORMAL HIGH (ref 65–99)
Potassium: 6.7 mmol/L (ref 3.5–5.1)
Sodium: 130 mmol/L — ABNORMAL LOW (ref 135–145)
Total Protein: 7.2 g/dL (ref 6.5–8.1)

## 2016-04-24 LAB — CBC
HEMATOCRIT: 32.8 % — AB (ref 39.0–52.0)
Hemoglobin: 10.7 g/dL — ABNORMAL LOW (ref 13.0–17.0)
MCH: 32.1 pg (ref 26.0–34.0)
MCHC: 32.6 g/dL (ref 30.0–36.0)
MCV: 98.5 fL (ref 78.0–100.0)
PLATELETS: 188 10*3/uL (ref 150–400)
RBC: 3.33 MIL/uL — ABNORMAL LOW (ref 4.22–5.81)
RDW: 17.4 % — AB (ref 11.5–15.5)
WBC: 27.8 10*3/uL — ABNORMAL HIGH (ref 4.0–10.5)

## 2016-04-24 LAB — CBC WITH DIFFERENTIAL/PLATELET
BASOS ABS: 0 10*3/uL (ref 0.0–0.1)
Basophils Relative: 0 %
Eosinophils Absolute: 0 10*3/uL (ref 0.0–0.7)
Eosinophils Relative: 0 %
HEMATOCRIT: 32.6 % — AB (ref 39.0–52.0)
Hemoglobin: 10.5 g/dL — ABNORMAL LOW (ref 13.0–17.0)
LYMPHS PCT: 8 %
Lymphs Abs: 2 10*3/uL (ref 0.7–4.0)
MCH: 32.2 pg (ref 26.0–34.0)
MCHC: 32.2 g/dL (ref 30.0–36.0)
MCV: 100 fL (ref 78.0–100.0)
MONOS PCT: 6 %
Monocytes Absolute: 1.5 10*3/uL — ABNORMAL HIGH (ref 0.1–1.0)
Neutro Abs: 22.1 10*3/uL — ABNORMAL HIGH (ref 1.7–7.7)
Neutrophils Relative %: 86 %
Platelets: 175 10*3/uL (ref 150–400)
RBC: 3.26 MIL/uL — AB (ref 4.22–5.81)
RDW: 17.5 % — ABNORMAL HIGH (ref 11.5–15.5)
WBC: 25.6 10*3/uL — AB (ref 4.0–10.5)

## 2016-04-24 LAB — BASIC METABOLIC PANEL
Anion gap: 18 — ABNORMAL HIGH (ref 5–15)
BUN: 74 mg/dL — AB (ref 6–20)
CHLORIDE: 95 mmol/L — AB (ref 101–111)
CO2: 15 mmol/L — AB (ref 22–32)
CREATININE: 6.73 mg/dL — AB (ref 0.61–1.24)
Calcium: 10 mg/dL (ref 8.9–10.3)
GFR calc Af Amer: 9 mL/min — ABNORMAL LOW (ref >60)
GFR calc non Af Amer: 8 mL/min — ABNORMAL LOW (ref >60)
Glucose, Bld: 252 mg/dL — ABNORMAL HIGH (ref 65–99)
POTASSIUM: 6.6 mmol/L — AB (ref 3.5–5.1)
Sodium: 128 mmol/L — ABNORMAL LOW (ref 135–145)

## 2016-04-24 LAB — LACTIC ACID, PLASMA
LACTIC ACID, VENOUS: 1.3 mmol/L (ref 0.5–1.9)
Lactic Acid, Venous: 1.4 mmol/L (ref 0.5–1.9)

## 2016-04-24 LAB — PHOSPHORUS: Phosphorus: 4 mg/dL (ref 2.5–4.6)

## 2016-04-24 LAB — I-STAT CG4 LACTIC ACID, ED: Lactic Acid, Venous: 2.86 mmol/L (ref 0.5–1.9)

## 2016-04-24 LAB — TROPONIN I
TROPONIN I: 0.4 ng/mL — AB (ref <0.03)
Troponin I: 0.28 ng/mL (ref <0.03)
Troponin I: 0.34 ng/mL (ref <0.03)
Troponin I: 0.39 ng/mL (ref <0.03)

## 2016-04-24 LAB — PROCALCITONIN: Procalcitonin: 5.81 ng/mL

## 2016-04-24 LAB — PROTIME-INR
INR: 1.88 — AB (ref 0.00–1.49)
Prothrombin Time: 21.5 seconds — ABNORMAL HIGH (ref 11.6–15.2)

## 2016-04-24 LAB — MRSA PCR SCREENING: MRSA BY PCR: NEGATIVE

## 2016-04-24 LAB — APTT: aPTT: 159 seconds — ABNORMAL HIGH (ref 24–37)

## 2016-04-24 LAB — BRAIN NATRIURETIC PEPTIDE: B Natriuretic Peptide: >4500 pg/mL — ABNORMAL HIGH (ref 0.0–100.0)

## 2016-04-24 LAB — GLUCOSE, CAPILLARY
Glucose-Capillary: 98 mg/dL (ref 65–99)
Glucose-Capillary: 175 mg/dL — ABNORMAL HIGH (ref 65–99)

## 2016-04-24 MED ORDER — GABAPENTIN 300 MG PO CAPS
300.0000 mg | ORAL_CAPSULE | Freq: Every day | ORAL | Status: DC
  Administered 2016-04-24 – 2016-05-01 (×8): 300 mg via ORAL
  Filled 2016-04-24 (×8): qty 1

## 2016-04-24 MED ORDER — ONDANSETRON HCL 4 MG/2ML IJ SOLN: 4.0000 mg | Freq: Four times a day (QID) | INTRAMUSCULAR | Status: DC | PRN

## 2016-04-24 MED ORDER — INSULIN ASPART 100 UNIT/ML IV SOLN
10.0000 [IU] | Freq: Once | INTRAVENOUS | Status: AC
  Administered 2016-04-24: 10 [IU] via INTRAVENOUS
  Filled 2016-04-24: qty 1

## 2016-04-24 MED ORDER — SODIUM CHLORIDE 0.9% FLUSH
3.0000 mL | Freq: Two times a day (BID) | INTRAVENOUS | Status: DC
  Administered 2016-04-24 – 2016-05-02 (×15): 3 mL via INTRAVENOUS

## 2016-04-24 MED ORDER — ATORVASTATIN CALCIUM 40 MG PO TABS
40.0000 mg | ORAL_TABLET | Freq: Every day | ORAL | Status: DC
  Administered 2016-04-24 – 2016-05-02 (×9): 40 mg via ORAL
  Filled 2016-04-24 (×10): qty 1

## 2016-04-24 MED ORDER — VANCOMYCIN HCL 10 G IV SOLR
1500.0000 mg | Freq: Once | INTRAVENOUS | Status: AC
  Administered 2016-04-24: 1500 mg via INTRAVENOUS
  Filled 2016-04-24: qty 1500

## 2016-04-24 MED ORDER — SODIUM THIOSULFATE 25 % IV SOLN
25.0000 g | INTRAVENOUS | Status: DC
  Administered 2016-04-24 – 2016-05-01 (×3): 25 g via INTRAVENOUS
  Filled 2016-04-24 (×7): qty 100

## 2016-04-24 MED ORDER — INSULIN ASPART 100 UNIT/ML ~~LOC~~ SOLN
0.0000 [IU] | Freq: Three times a day (TID) | SUBCUTANEOUS | Status: DC
  Administered 2016-04-25: 1 [IU] via SUBCUTANEOUS
  Administered 2016-04-26 (×2): 2 [IU] via SUBCUTANEOUS
  Administered 2016-04-26: 1 [IU] via SUBCUTANEOUS
  Administered 2016-04-27 (×3): 2 [IU] via SUBCUTANEOUS
  Administered 2016-04-28 (×2): 1 [IU] via SUBCUTANEOUS
  Administered 2016-04-29 – 2016-04-30 (×2): 2 [IU] via SUBCUTANEOUS
  Administered 2016-04-30 (×2): 1 [IU] via SUBCUTANEOUS
  Administered 2016-05-01 – 2016-05-02 (×2): 2 [IU] via SUBCUTANEOUS
  Administered 2016-05-02: 1 [IU] via SUBCUTANEOUS
  Administered 2016-05-02: 2 [IU] via SUBCUTANEOUS

## 2016-04-24 MED ORDER — NEPRO/CARBSTEADY PO LIQD: 237.0000 mL | Freq: Three times a day (TID) | ORAL | Status: DC | PRN

## 2016-04-24 MED ORDER — ACETAMINOPHEN 325 MG PO TABS: 650.0000 mg | ORAL_TABLET | Freq: Four times a day (QID) | ORAL | Status: DC | PRN

## 2016-04-24 MED ORDER — NITROGLYCERIN 0.4 MG SL SUBL: 0.4000 mg | SUBLINGUAL_TABLET | SUBLINGUAL | Status: DC | PRN

## 2016-04-24 MED ORDER — DEXTROSE 50 % IV SOLN
100.0000 mL | Freq: Once | INTRAVENOUS | Status: AC
  Administered 2016-04-24: 100 mL via INTRAVENOUS
  Filled 2016-04-24: qty 100

## 2016-04-24 MED ORDER — CARVEDILOL 3.125 MG PO TABS
3.1250 mg | ORAL_TABLET | Freq: Two times a day (BID) | ORAL | Status: DC
  Administered 2016-04-25 – 2016-05-02 (×13): 3.125 mg via ORAL
  Filled 2016-04-24 (×16): qty 1

## 2016-04-24 MED ORDER — ADULT MULTIVITAMIN W/MINERALS CH
1.0000 | ORAL_TABLET | Freq: Every day | ORAL | Status: DC
  Administered 2016-04-24 – 2016-05-02 (×9): 1 via ORAL
  Filled 2016-04-24 (×9): qty 1

## 2016-04-24 MED ORDER — ACETAMINOPHEN 650 MG RE SUPP: 650.0000 mg | Freq: Four times a day (QID) | RECTAL | Status: DC | PRN

## 2016-04-24 MED ORDER — OXYCODONE-ACETAMINOPHEN 5-325 MG PO TABS
ORAL_TABLET | ORAL | Status: AC
  Filled 2016-04-24: qty 2

## 2016-04-24 MED ORDER — TRAMADOL HCL 50 MG PO TABS
ORAL_TABLET | ORAL | Status: AC
  Filled 2016-04-24: qty 1

## 2016-04-24 MED ORDER — AMIODARONE HCL 100 MG PO TABS
100.0000 mg | ORAL_TABLET | Freq: Every day | ORAL | Status: DC
  Administered 2016-04-24 – 2016-05-02 (×9): 100 mg via ORAL
  Filled 2016-04-24 (×9): qty 1

## 2016-04-24 MED ORDER — ALBUTEROL SULFATE (2.5 MG/3ML) 0.083% IN NEBU: 2.5000 mg | INHALATION_SOLUTION | Freq: Four times a day (QID) | RESPIRATORY_TRACT | Status: DC | PRN

## 2016-04-24 MED ORDER — CALCIUM CARBONATE 1250 MG/5ML PO SUSP: 500.0000 mg | Freq: Four times a day (QID) | ORAL | Status: DC | PRN

## 2016-04-24 MED ORDER — SILVER SULFADIAZINE 1 % EX CREA
1.0000 "application " | TOPICAL_CREAM | Freq: Two times a day (BID) | CUTANEOUS | Status: DC
  Administered 2016-04-24 – 2016-05-02 (×14): 1 via TOPICAL
  Filled 2016-04-24 (×2): qty 85

## 2016-04-24 MED ORDER — CLOPIDOGREL BISULFATE 75 MG PO TABS
75.0000 mg | ORAL_TABLET | Freq: Every day | ORAL | Status: DC
  Administered 2016-04-24 – 2016-05-02 (×9): 75 mg via ORAL
  Filled 2016-04-24 (×9): qty 1

## 2016-04-24 MED ORDER — VANCOMYCIN HCL IN DEXTROSE 1-5 GM/200ML-% IV SOLN
1000.0000 mg | INTRAVENOUS | Status: DC
  Administered 2016-04-24: 1000 mg via INTRAVENOUS
  Filled 2016-04-24 (×2): qty 200

## 2016-04-24 MED ORDER — SORBITOL 70 % SOLN
30.0000 mL | Status: DC | PRN
  Administered 2016-04-28: 30 mL via ORAL
  Filled 2016-04-24 (×3): qty 30

## 2016-04-24 MED ORDER — VANCOMYCIN HCL IN DEXTROSE 1-5 GM/200ML-% IV SOLN
INTRAVENOUS | Status: AC
  Filled 2016-04-24: qty 200

## 2016-04-24 MED ORDER — CLOBETASOL PROPIONATE 0.05 % EX OINT
1.0000 "application " | TOPICAL_OINTMENT | Freq: Two times a day (BID) | CUTANEOUS | Status: DC
  Administered 2016-04-24 – 2016-05-02 (×14): 1 via TOPICAL
  Filled 2016-04-24 (×2): qty 15

## 2016-04-24 MED ORDER — VANCOMYCIN HCL IN DEXTROSE 1-5 GM/200ML-% IV SOLN: 1000.0000 mg | Freq: Once | INTRAVENOUS | Status: DC

## 2016-04-24 MED ORDER — ONDANSETRON HCL 4 MG PO TABS: 4.0000 mg | ORAL_TABLET | Freq: Four times a day (QID) | ORAL | Status: DC | PRN

## 2016-04-24 MED ORDER — PIPERACILLIN-TAZOBACTAM 3.375 G IVPB 30 MIN: 3.3750 g | Freq: Once | INTRAVENOUS | Status: DC

## 2016-04-24 MED ORDER — WARFARIN - PHARMACIST DOSING INPATIENT: Freq: Every day | Status: DC

## 2016-04-24 MED ORDER — ACETAMINOPHEN 325 MG PO TABS
ORAL_TABLET | ORAL | Status: AC
  Filled 2016-04-24: qty 2

## 2016-04-24 MED ORDER — HYDROXYZINE HCL 25 MG PO TABS
25.0000 mg | ORAL_TABLET | Freq: Three times a day (TID) | ORAL | Status: DC | PRN
  Filled 2016-04-24: qty 1

## 2016-04-24 MED ORDER — CEFEPIME HCL 2 G IJ SOLR
2.0000 g | Freq: Once | INTRAMUSCULAR | Status: AC
  Administered 2016-04-24: 2 g via INTRAVENOUS
  Filled 2016-04-24: qty 2

## 2016-04-24 MED ORDER — ONDANSETRON HCL 4 MG/2ML IJ SOLN
4.0000 mg | Freq: Four times a day (QID) | INTRAMUSCULAR | Status: DC | PRN
  Administered 2016-04-28: 4 mg via INTRAVENOUS
  Filled 2016-04-24: qty 2

## 2016-04-24 MED ORDER — ONDANSETRON HCL 4 MG PO TABS
4.0000 mg | ORAL_TABLET | Freq: Four times a day (QID) | ORAL | Status: DC | PRN
Start: 2016-04-24 — End: 2016-04-24

## 2016-04-24 MED ORDER — DOCUSATE SODIUM 100 MG PO CAPS
100.0000 mg | ORAL_CAPSULE | Freq: Every day | ORAL | Status: DC | PRN
Start: 2016-04-24 — End: 2016-05-02
  Administered 2016-04-29: 100 mg via ORAL
  Filled 2016-04-24: qty 1

## 2016-04-24 MED ORDER — ALBUTEROL SULFATE HFA 108 (90 BASE) MCG/ACT IN AERS: 2.0000 | INHALATION_SPRAY | Freq: Four times a day (QID) | RESPIRATORY_TRACT | Status: DC | PRN

## 2016-04-24 MED ORDER — APIXABAN 5 MG PO TABS
5.0000 mg | ORAL_TABLET | Freq: Two times a day (BID) | ORAL | Status: DC
  Administered 2016-04-24 – 2016-05-02 (×16): 5 mg via ORAL
  Filled 2016-04-24 (×16): qty 1

## 2016-04-24 MED ORDER — ZOLPIDEM TARTRATE 5 MG PO TABS
5.0000 mg | ORAL_TABLET | Freq: Every evening | ORAL | Status: DC | PRN
  Administered 2016-04-27 – 2016-04-29 (×2): 5 mg via ORAL
  Filled 2016-04-24 (×2): qty 1

## 2016-04-24 MED ORDER — MOMETASONE FURO-FORMOTEROL FUM 200-5 MCG/ACT IN AERO
2.0000 | INHALATION_SPRAY | Freq: Two times a day (BID) | RESPIRATORY_TRACT | Status: DC | PRN
  Filled 2016-04-24: qty 8.8

## 2016-04-24 MED ORDER — ACETAMINOPHEN 325 MG PO TABS
650.0000 mg | ORAL_TABLET | Freq: Four times a day (QID) | ORAL | Status: DC | PRN
  Administered 2016-04-24: 650 mg via ORAL

## 2016-04-24 MED ORDER — CALCIUM GLUCONATE 10 % IV SOLN
2.0000 g | Freq: Once | INTRAVENOUS | Status: AC
  Administered 2016-04-24: 2 g via INTRAVENOUS
  Filled 2016-04-24: qty 20

## 2016-04-24 MED ORDER — TRAMADOL HCL 50 MG PO TABS
50.0000 mg | ORAL_TABLET | Freq: Four times a day (QID) | ORAL | Status: DC | PRN
  Administered 2016-04-24 – 2016-04-27 (×6): 50 mg via ORAL
  Filled 2016-04-24 (×5): qty 1

## 2016-04-24 MED ORDER — DOCUSATE SODIUM 283 MG RE ENEM: 1.0000 | ENEMA | RECTAL | Status: DC | PRN

## 2016-04-24 MED ORDER — SODIUM CHLORIDE 0.9 % IV BOLUS (SEPSIS)
1000.0000 mL | Freq: Once | INTRAVENOUS | Status: AC
  Administered 2016-04-24: 1000 mL via INTRAVENOUS

## 2016-04-24 MED ORDER — CAMPHOR-MENTHOL 0.5-0.5 % EX LOTN: 1.0000 "application " | TOPICAL_LOTION | Freq: Three times a day (TID) | CUTANEOUS | Status: DC | PRN

## 2016-04-24 MED ORDER — DEXTROSE 5 % IV SOLN
2.0000 g | INTRAVENOUS | Status: DC
  Administered 2016-04-24: 2 g via INTRAVENOUS
  Filled 2016-04-24 (×6): qty 2

## 2016-04-24 MED ORDER — OXYCODONE-ACETAMINOPHEN 5-325 MG PO TABS
2.0000 | ORAL_TABLET | Freq: Once | ORAL | Status: AC
Start: 2016-04-24 — End: 2016-04-24
  Administered 2016-04-24: 2 via ORAL

## 2016-04-24 NOTE — Progress Notes (Signed)
RT note: Pt. transported uneventfully from 2C-18 to Dialysis on V-60 BiPAP, RT covering floor made aware, left # for RN.

## 2016-04-24 NOTE — Progress Notes (Signed)
RN took patient off bi-pap during dialysis and brought patient back to room on a [place]. RT will continue to monitor.

## 2016-04-24 NOTE — Progress Notes (Signed)
Patient in dialysis.

## 2016-04-24 NOTE — Discharge Instructions (Signed)

## 2016-04-24 NOTE — Procedures (Signed)
  I was present at this dialysis session, have reviewed the session itself and made  appropriate changes Kelly Splinter MD [place] pager [phone]    cell [phone] 04/24/2016, 12:01 PM

## 2016-04-24 NOTE — ED Notes (Signed)
Dr Claudine Mouton notified of high lactic acid level

## 2016-04-24 NOTE — ED Notes (Signed)
Pt placed on 4L o2

## 2016-04-24 NOTE — Progress Notes (Signed)
VASCULAR LAB PRELIMINARY  PRELIMINARY  PRELIMINARY  PRELIMINARY  Bilateral lower extremity venous duplex completed.    Preliminary report:  There is no obvious evidence of DVT or SVT noted in the visualized veins of the bilateral lower extremities.   Charlita Brian, RVT 04/24/2016, 5:35 PM    .c

## 2016-04-24 NOTE — Consult Note (Addendum)
Renal Service Consult Note [place] 04/24/2016 [place] [facility] Requesting Physician:  Dr Grandville Silos  Reason for Consult:  L leg pain, ESRD HPI: The patient is a 65 y.o. 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  Came to ED early am w pain in L leg, "sores" on the same leg.  For a few weeks has been developing "wounds" at the back of his L leg, very painful and tender.  No fevers, chills, no trauma.  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  No biopsy was recommended due to small area, started on Na thio and vit D dc'd.  In ED CXR showed bilat CHF and pt was having difficulty breathing; neph notified and HD orders written, pt is now on HD and breathing better.  Now of bipap and on 2L [place].    Pt denies CP, abd pain, n/v/d, no dysuria.  NO f/c/s.    Pt grew up in [place]  Did 2 yrs college and worked as a Art gallery manager.  Then he had a stroke which affected his memory and he had to stop work.  He was driving until he had vision problems so doesn't drive anymore.  He is divorced, has 2 children, lives w his son and his fiance and his kids. No tob/ etoh.  Ambulated fine at home until this problem developed.    ROS  denies CP  no joint pain   no HA  no blurry vision  no rash  no diarrhea  no nausea/ vomiting  no dysuria  no difficulty voiding  no change in urine color    Past Medical History  Past Medical History  Diagnosis Date  . Systolic and diastolic CHF, chronic ([place])     a. Echo at [facility] 11/17/2013 Ef <25%, severe hypokinesis of LV, moderately dilated LA, grade IV/IV diastolic dysfunction with irreversible restrictive pattern of mitral inflow, severe pulm HTN (RVSP >69mmHg), 1-2+ MR  b. Echo at [facility] 04/02/14 EF 25-30%, moderate concentric LVH, diffuse hypokinesis, moderately dilated LA, no significant MR observed  . CAD  (coronary artery disease)     a. PCI LAD/RCA in 2007  b. PCI LAD/RCA in 2009 at [facility]  c. PCI LAD 08/2013, CTO of RCA and nonobstructive disease in the LCx.  Marland Kitchen Hyperlipidemia   . Dementia   . Ischemic cardiomyopathy     a. s/p Biotronik ICD 06/2014.  Marland Kitchen Retinopathy     bilateral  . HTN (hypertension)   . Abnormal renal ultrasound     a. 03/2014: Abnormal renal ultrasound with left renal lesion and bladder wall thickening and microscopic hematuria. Multiple cysts on prior MRI. Given inability to use contrast, f/u imaging limited, repeat US 3 months with outpatient uro f/u.  Marland Kitchen Paroxysmal atrial fibrillation (HCC)   . Paroxysmal atrial flutter ([place])   . Anemia   . Myocardial infarction [facility]) July 2016    Heart attack  . Peripheral vascular disease ([place])   . Stroke [facility])     right arm weakness  . Asthma   . CKD (chronic kidney disease), stage IV ([place])     on dialysis T/TH/Sa  . 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 H/O cardiac arrest 04/2015  . Constipation   . AICD (automatic cardioverter/defibrillator) present   . ESRD (end stage renal disease) on dialysis (  [place])   . Pneumonia     when he was younger, March 2017   Past Surgical History  Past Surgical History  Procedure Laterality Date  . Coronary stent placement      Multivessel coronary intervention in 2007 and 2009. Ischemic cardiomyopathyPCI LAD/ RCA in 2007 by Dr. Dwyane Dee. PCI LAD/RCA in 2009 at [facility]; PCI LAD 08/2013. Presented with unstable angina pectoris.. Coronary angio demonstrated proximal and distal LAD lesions.42/20 PROMUS stent in proximal LAD// PTCA of distal LAD  . Cardiac defibrillator placement    . Spine surgery      anterior fusion  . Hernia repair    . Tee without cardioversion N/A 07/26/2014    Procedure: TRANSESOPHAGEAL ECHOCARDIOGRAM (TEE);  Surgeon: Larey Dresser, MD;  Location: [place];  Service: Cardiovascular;  Laterality: N/A;  . Cardioversion N/A 07/26/2014     Procedure: CARDIOVERSION;  Surgeon: Larey Dresser, MD;  Location: [place];  Service: Cardiovascular;  Laterality: N/A;  . Right heart catheterization N/A 07/25/2014    Procedure: RIGHT HEART CATH;  Surgeon: Jolaine Artist, MD;  Location: [facility] CATH LAB;  Service: Cardiovascular;  Laterality: N/A;  . Cardiac defibrillator placement  06/2014  . Insertion of dialysis catheter    . Eye surgery Bilateral     for burst blood vessel  . Cardiac catheterization  04/2015  . Coronary angioplasty    . Colonoscopy w/ polypectomy    . Finger fracture surgery Right     little finger  . Av fistula placement Left 08/03/2015    Procedure: ARTERIOVENOUS (AV) FISTULA CREATION VERSUS BASILIC VEIN TRANSPOSITION VERSUS ARTERIOVENOUS GRAFT INSERTION;  Surgeon: Angelia Mould, MD;  Location: [place];  Service: Vascular;  Laterality: Left;  . Peripheral vascular catheterization N/A 11/19/2015    Procedure: Fistulagram;  Surgeon: Angelia Mould, MD;  Location: [place] CV LAB;  Service: Cardiovascular;  Laterality: N/A;  . Tee without cardioversion N/A 01/02/2016    Procedure: TRANSESOPHAGEAL ECHOCARDIOGRAM (TEE);  Surgeon: Larey Dresser, MD;  Location: [place];  Service: Cardiovascular;  Laterality: N/A;  . Cardioversion N/A 01/02/2016    Procedure: CARDIOVERSION;  Surgeon: Larey Dresser, MD;  Location: [place];  Service: Cardiovascular;  Laterality: N/A;  . Av fistula placement Right 02/12/2016    Procedure: Creation of Right Arm  Brachiocephalic ARTERIOVENOUS (AV) FISTULA ;  Surgeon: Angelia Mould, MD;  Location: [facility] OR;  Service: Vascular;  Laterality: Right;   Family History  Family History  Problem Relation Age of Onset  . Heart disease      brother  . Kidney disease    . Diabetes Mother     Bilateral Leg  . Heart disease Mother   . Cancer Father     Right neck  . Diabetes Sister   . Hyperlipidemia Sister   . Hypertension Sister   . Heart disease Sister   . Diabetes  Brother   . Hyperlipidemia Brother   . Hypertension Brother   . Heart attack Brother   . Heart disease Brother    Social History  reports that he has never smoked. He has never used smokeless tobacco. He reports that he does not drink alcohol or use illicit drugs. Allergies No Known Allergies Home medications Prior to Admission medications   Medication Sig Start Date End Date Taking? Authorizing Provider  acetaminophen (TYLENOL) 325 MG tablet Take 650 mg by mouth every 8 (eight) hours as needed for headache (pain).    Yes Historical Provider, MD  albuterol (PROVENTIL HFA;VENTOLIN HFA) 108 (90 BASE) MCG/ACT inhaler Inhale 2 puffs into the lungs every 6 (six) hours as needed for wheezing or shortness of breath.   Yes Historical Provider, MD  albuterol (PROVENTIL) (2.5 MG/3ML) 0.083% nebulizer solution Take 2.5 mg by nebulization every 6 (six) hours as needed for wheezing or shortness of breath.   Yes Historical Provider, MD  amiodarone (PACERONE) 200 MG tablet Take 0.5 tablets (100 mg total) by mouth daily. 02/06/16  Yes Larey Dresser, MD  atorvastatin (LIPITOR) 40 MG tablet TAKE ONE TABLET BY MOUTH ONCE DAILY 03/04/16  Yes Will Meredith Leeds, MD  clobetasol ointment (TEMOVATE) [id number] % Apply 1 application topically 2 (two) times daily.   Yes Historical Provider, MD  clopidogrel (PLAVIX) 75 MG tablet TAKE ONE TABLET BY MOUTH ONCE DAILY 01/29/16  Yes Larey Dresser, MD  docusate sodium (COLACE) 100 MG capsule Take 100 mg by mouth daily as needed (constipation).    Yes Historical Provider, MD  gabapentin (NEURONTIN) 300 MG capsule Take 300 mg by mouth at bedtime.   Yes Historical Provider, MD  insulin lispro (HUMALOG) 100 UNIT/ML injection Inject 10 Units into the skin 2 (two) times daily as needed for high blood sugar (CBG >180). Reported on 04/09/2016   Yes Historical Provider, MD  mometasone-formoterol (DULERA) 200-5 MCG/ACT AERO Inhale 2 puffs into the lungs 2 (two) times daily as needed for  wheezing or shortness of breath.   Yes Historical Provider, MD  Multiple Vitamin (MULTIVITAMIN WITH MINERALS) TABS tablet Take 1 tablet by mouth daily.   Yes Historical Provider, MD  mupirocin ointment (BACTROBAN) 2 % Place 1 application into the nose 2 (two) times daily.   Yes Historical Provider, MD  nitroGLYCERIN (NITROSTAT) 0.4 MG SL tablet Place 1 tablet (0.4 mg total) under the tongue every 5 (five) minutes as needed for chest pain. 10/02/15  Yes Larey Dresser, MD  silver sulfADIAZINE (SILVADENE) 1 % cream Apply 1 application topically 2 (two) times daily.   Yes Historical Provider, MD  traMADol (ULTRAM) 50 MG tablet Take 50 mg by mouth every 6 (six) hours as needed for moderate pain.   Yes Historical Provider, MD  warfarin (COUMADIN) 5 MG tablet Take 2.5 mg by mouth daily.   Yes Historical Provider, MD  warfarin (COUMADIN) 5 MG tablet TAKE ONE TABLET BY MOUTH AS DIRECTED Patient not taking: Reported on 04/24/2016 02/26/16   Larey Dresser, MD   Liver Function Tests  Recent Labs Lab 04/24/16 0710  AST 25  ALT 20  ALKPHOS 145*  BILITOT 1.8*  PROT 7.2  ALBUMIN 3.0*   No results for input(s): LIPASE, AMYLASE in the last 168 hours. CBC  Recent Labs Lab 04/24/16 0141 04/24/16 0710  WBC 27.8* 25.6*  NEUTROABS  --  22.1*  HGB 10.7* 10.5*  HCT 32.8* 32.6*  MCV 98.5 100.0  PLT 188 0000000   Basic Metabolic Panel  Recent Labs Lab 04/24/16 0141 04/24/16 0710  NA 128* 130*  K 6.6* 6.7*  CL 95* 96*  CO2 15* 19*  GLUCOSE 252* 113*  BUN 74* 75*  CREATININE 6.73* 7.02*  CALCIUM 10.0 9.8   Iron/TIBC/Ferritin/ %Sat    Component Value Date/Time   IRON 56 01/16/2015 1241   IRON 25* 07/18/2014 1259   TIBC 316 01/16/2015 1241   TIBC 274 07/18/2014 1259   FERRITIN 279 01/16/2015 1241   FERRITIN 398* 07/18/2014 1259   IRONPCTSAT 18* 01/16/2015 1241   IRONPCTSAT 9* 07/18/2014 1259  Filed Vitals:   04/24/16 0930 04/24/16 1000 04/24/16 1030 04/24/16 1100  BP: 149/77 123/78  132/79 111/53  Pulse: 90 81 79 81  Temp:      TempSrc:      Resp:   19 23  Height:      Weight:      SpO2:  100% 100% 100%   Exam Gen alert, on bipap earlier, now on [place] 2L No rash, cyanosis or gangrene Sclera anicteric, throat clear  +JVD , no bruits/ LAD Chest clear bilat to bases Cor irreg irreg no MRG Abd firm sig distension, +BS, tympanitic, nontender, no hsm GU glans has patchy skin discoloration, no actual gangrene that I can see, not sig tender, no erythema MS no joint swelling Ext L calf is tender/ indurated/ mildly erythematous post> ant, there are some areas of purplish skin discoloration in the mid calf post, and one on the lateral ant shin; 1+ bilat LE edema below the knees Neuro is alert, Ox 3 , nf RUA AVF good bruit, hasn't been used yet IJ cath working well  CXR 6/29 - bilat CHF w R effusion  Dialysis: AF TTS  78kg  3K/2.25 bath  P4  Hep 2600  IJ cath (maturing RUA AVF) Na thio 25 gm (for calciphylaxis)  Assessment: 1  L leg pain/ swelling/ erythema - cellulitis and/or calciphylaxis.  The leg "wounds" have been developing slowly and are extremely painful which suggests calciphylaxis.  Getting emp abx for poss cellulitis. Ca here is high (10, corrects to 10.8).  Goal uncorr Ca should be 7.0 -8.0 to get Ca out of the body his likely calciphylaxis condition.  Will initiate low Ca bath and review OP records to be sure no vit D / Ca-cont meds.  2  ESRD HD TTS 3  Chron afib on amio, coumadin 4  CAD hx stent 5  Hx CVA 6  COPD  7  DM on insulin 8  Vol is just over dry wt but +pulm edema so has lost body wt   Plan - HD today, max UF, lower dry wt, has chest CT ordered for today.  Low Ca bath all HD, get uncorr Ca down 7.0- 8.0  Kelly Splinter MD [employer] pager [phone]    cell [phone] 04/24/2016, 11:32 AM

## 2016-04-24 NOTE — H&P (Signed)
History and Physical    Tony Freeman [id number] DOB: 1951/07/10 DOA: 04/24/2016  PCP: Tanya Nones, MD  Patient coming from: Home.  Chief Complaint: Left lower extremity pain.  HPI: Tony Freeman is a 65 y.o. male with with history of chronic atrial fibrillation, ischemic cardiomyopathy status post ICD placement, CAD status post stenting, diabetes mellitus type 2, ESRD on hemodialysis on Tuesday Thursday since Saturday and chronic combined systolic and diastolic heart failure presents to the ER because of increasing pain in his left lower extremity. Patient states he has been having pain in his left lower extremity over the last 2-3 days. Denies any fever or chills. Denies any trauma. In the ER patient was found to be febrile with labs showing leukocytosis and patient was hypoxic requiring BiPAP. Chest x-ray was showing airspace opacity and right-sided pleural effusion which was worsening. Patient labs also show Hyprkalemia for  which patient was given calcium gluconate and sodium bicarbonate and insulin D50. On call nephrologist Dr. Lorrene Reid was consulted by ER physician and patient is to get early dialysis. Patient states he is mildly short of breath and has some nonproductive cough otherwise denies any chest pain. On exam patient's both lower extremity edematous with mild tenderness in the left lower extremity extending from the left knee to the foot. There is ulceration on the posterior aspect of his left leg. No active discharge. 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   ED Course: Was started on antibiotics and was given insulin and calcium gluconate sodium bicarbonate and D50.  Review of Systems: As per HPI, rest all negative.   Past Medical History  Diagnosis Date  . Systolic and diastolic CHF, chronic ([place])     a. Echo at [facility]  11/17/2013 Ef <25%, severe hypokinesis of LV, moderately dilated LA, grade IV/IV diastolic dysfunction with irreversible restrictive pattern of mitral inflow, severe pulm HTN (RVSP >14mmHg), 1-2+ MR  b. Echo at [facility] 04/02/14 EF 25-30%, moderate concentric LVH, diffuse hypokinesis, moderately dilated LA, no significant MR observed  . CAD (coronary artery disease)     a. PCI LAD/RCA in 2007  b. PCI LAD/RCA in 2009 at [facility]  c. PCI LAD 08/2013, CTO of RCA and nonobstructive disease in the LCx.  Marland Kitchen Hyperlipidemia   . Dementia   . Ischemic cardiomyopathy     a. s/p Biotronik ICD 06/2014.  Marland Kitchen Retinopathy     bilateral  . HTN (hypertension)   . Abnormal renal ultrasound     a. 03/2014: Abnormal renal ultrasound with left renal lesion and bladder wall thickening and microscopic hematuria. Multiple cysts on prior MRI. Given inability to use contrast, f/u imaging limited, repeat US 3 months with outpatient uro f/u.  Marland Kitchen Paroxysmal atrial fibrillation (HCC)   . Paroxysmal atrial flutter ([place])   . Anemia   . Myocardial infarction [facility]) July 2016    Heart attack  . Peripheral vascular disease ([place])   . Stroke [facility])     right arm weakness  . Asthma   . CKD (chronic kidney disease), stage IV ([place])     on dialysis T/TH/Sa  . Diabetes mellitus with nephropathy ([place])     type 2  . H/O cardiac arrest 04/2015  . Constipation   . AICD (automatic cardioverter/defibrillator) present   . ESRD (end stage renal disease) on dialysis ([place])   . Pneumonia  when he was younger, March 2017    Past Surgical History  Procedure Laterality Date  . Coronary stent placement      Multivessel coronary intervention in 2007 and 2009. Ischemic cardiomyopathyPCI LAD/ RCA in 2007 by Dr. Dwyane Dee. PCI LAD/RCA in 2009 at [facility]; PCI LAD 08/2013. Presented with unstable angina pectoris.. Coronary angio demonstrated proximal and distal LAD lesions.48/20 PROMUS stent in proximal LAD// PTCA of distal LAD  . Cardiac  defibrillator placement    . Spine surgery      anterior fusion  . Hernia repair    . Tee without cardioversion N/A 07/26/2014    Procedure: TRANSESOPHAGEAL ECHOCARDIOGRAM (TEE);  Surgeon: Larey Dresser, MD;  Location: [place];  Service: Cardiovascular;  Laterality: N/A;  . Cardioversion N/A 07/26/2014    Procedure: CARDIOVERSION;  Surgeon: Larey Dresser, MD;  Location: [place];  Service: Cardiovascular;  Laterality: N/A;  . Right heart catheterization N/A 07/25/2014    Procedure: RIGHT HEART CATH;  Surgeon: Jolaine Artist, MD;  Location: [facility] CATH LAB;  Service: Cardiovascular;  Laterality: N/A;  . Cardiac defibrillator placement  06/2014  . Insertion of dialysis catheter    . Eye surgery Bilateral     for burst blood vessel  . Cardiac catheterization  04/2015  . Coronary angioplasty    . Colonoscopy w/ polypectomy    . Finger fracture surgery Right     little finger  . Av fistula placement Left 08/03/2015    Procedure: ARTERIOVENOUS (AV) FISTULA CREATION VERSUS BASILIC VEIN TRANSPOSITION VERSUS ARTERIOVENOUS GRAFT INSERTION;  Surgeon: Angelia Mould, MD;  Location: [place];  Service: Vascular;  Laterality: Left;  . Peripheral vascular catheterization N/A 11/19/2015    Procedure: Fistulagram;  Surgeon: Angelia Mould, MD;  Location: [place] CV LAB;  Service: Cardiovascular;  Laterality: N/A;  . Tee without cardioversion N/A 01/02/2016    Procedure: TRANSESOPHAGEAL ECHOCARDIOGRAM (TEE);  Surgeon: Larey Dresser, MD;  Location: [place];  Service: Cardiovascular;  Laterality: N/A;  . Cardioversion N/A 01/02/2016    Procedure: CARDIOVERSION;  Surgeon: Larey Dresser, MD;  Location: [place];  Service: Cardiovascular;  Laterality: N/A;  . Av fistula placement Right 02/12/2016    Procedure: Creation of Right Arm  Brachiocephalic ARTERIOVENOUS (AV) FISTULA ;  Surgeon: Angelia Mould, MD;  Location: [place];  Service: Vascular;  Laterality: Right;      reports that he has never smoked. He has never used smokeless tobacco. He reports that he does not drink alcohol or use illicit drugs.  No Known Allergies  Family History  Problem Relation Age of Onset  . Heart disease      brother  . Kidney disease    . Diabetes Mother     Bilateral Leg  . Heart disease Mother   . Cancer Father     Right neck  . Diabetes Sister   . Hyperlipidemia Sister   . Hypertension Sister   . Heart disease Sister   . Diabetes Brother   . Hyperlipidemia Brother   . Hypertension Brother   . Heart attack Brother   . Heart disease Brother     Prior to Admission medications   Medication Sig Start Date End Date Taking? Authorizing Provider  acetaminophen (TYLENOL) 325 MG tablet Take 650 mg by mouth every 8 (eight) hours as needed for headache (pain).    Yes Historical Provider, MD  albuterol (PROVENTIL HFA;VENTOLIN HFA) 108 (90 BASE) MCG/ACT inhaler Inhale 2 puffs into the lungs  every 6 (six) hours as needed for wheezing or shortness of breath.   Yes Historical Provider, MD  albuterol (PROVENTIL) (2.5 MG/3ML) 0.083% nebulizer solution Take 2.5 mg by nebulization every 6 (six) hours as needed for wheezing or shortness of breath.   Yes Historical Provider, MD  amiodarone (PACERONE) 200 MG tablet Take 0.5 tablets (100 mg total) by mouth daily. 02/06/16  Yes Larey Dresser, MD  atorvastatin (LIPITOR) 40 MG tablet TAKE ONE TABLET BY MOUTH ONCE DAILY 03/04/16  Yes Will Meredith Leeds, MD  clobetasol ointment (TEMOVATE) [id number] % Apply 1 application topically 2 (two) times daily.   Yes Historical Provider, MD  clopidogrel (PLAVIX) 75 MG tablet TAKE ONE TABLET BY MOUTH ONCE DAILY 01/29/16  Yes Larey Dresser, MD  docusate sodium (COLACE) 100 MG capsule Take 100 mg by mouth daily as needed (constipation).    Yes Historical Provider, MD  gabapentin (NEURONTIN) 300 MG capsule Take 300 mg by mouth at bedtime.   Yes Historical Provider, MD  insulin lispro (HUMALOG) 100 UNIT/ML  injection Inject 10 Units into the skin 2 (two) times daily as needed for high blood sugar (CBG >180). Reported on 04/09/2016   Yes Historical Provider, MD  mometasone-formoterol (DULERA) 200-5 MCG/ACT AERO Inhale 2 puffs into the lungs 2 (two) times daily as needed for wheezing or shortness of breath.   Yes Historical Provider, MD  Multiple Vitamin (MULTIVITAMIN WITH MINERALS) TABS tablet Take 1 tablet by mouth daily.   Yes Historical Provider, MD  mupirocin ointment (BACTROBAN) 2 % Place 1 application into the nose 2 (two) times daily.   Yes Historical Provider, MD  nitroGLYCERIN (NITROSTAT) 0.4 MG SL tablet Place 1 tablet (0.4 mg total) under the tongue every 5 (five) minutes as needed for chest pain. 10/02/15  Yes Larey Dresser, MD  silver sulfADIAZINE (SILVADENE) 1 % cream Apply 1 application topically 2 (two) times daily.   Yes Historical Provider, MD  traMADol (ULTRAM) 50 MG tablet Take 50 mg by mouth every 6 (six) hours as needed for moderate pain.   Yes Historical Provider, MD  warfarin (COUMADIN) 5 MG tablet Take 2.5 mg by mouth daily.   Yes Historical Provider, MD  warfarin (COUMADIN) 5 MG tablet TAKE ONE TABLET BY MOUTH AS DIRECTED Patient not taking: Reported on 04/24/2016 02/26/16   Larey Dresser, MD    Physical Exam: Filed Vitals:   04/24/16 [id number] 04/24/16 0332 04/24/16 0345 04/24/16 0450  BP: 138/79  154/69 142/65  Pulse: 86  80 76  Temp:  100 F (37.8 C)  99.2 F (37.3 C)  TempSrc:  Rectal  Axillary  Resp: 20  17   Height:    5\' 6"  (1.676 m)  Weight:    176 lb 2.4 oz (79.9 kg)  SpO2: 100%  100%       Constitutional: Not in distress. Filed Vitals:   04/24/16 0332 04/24/16 0332 04/24/16 0345 04/24/16 0450  BP: 138/79  154/69 142/65  Pulse: 86  80 76  Temp:  100 F (37.8 C)  99.2 F (37.3 C)  TempSrc:  Rectal  Axillary  Resp: 20  17   Height:    5\' 6"  (1.676 m)  Weight:    176 lb 2.4 oz (79.9 kg)  SpO2: 100%  100%    Eyes: Anicteric no pallor. ENMT: No  discharge from the ears eyes nose and mouth. Neck: No JVD appreciated no mass felt. Respiratory: Bilateral air entry present no rhonchi or crepitations. Cardiovascular: S1-S2  heard. Abdomen: Distended soft nontender bowel sounds present. Musculoskeletal: Bilateral lower extremity edema present and there is ulceration on the posterior aspect of his left cough. No active discharge. Mild erythema. Skin: See musculoskeletal system. Neurologic: Alert awake oriented to time place and person. Moves all extremities. Psychiatric: Appears normal.   Labs on Admission: I have personally reviewed following labs and imaging studies  CBC:  Recent Labs Lab 04/24/16 0141  WBC 27.8*  HGB 10.7*  HCT 32.8*  MCV 98.5  PLT 0000000   Basic Metabolic Panel:  Recent Labs Lab 04/24/16 0141  NA 128*  K 6.6*  CL 95*  CO2 15*  GLUCOSE 252*  BUN 74*  CREATININE 6.73*  CALCIUM 10.0   GFR: Estimated Creatinine Clearance: 11.2 mL/min (by C-G formula based on Cr of 6.73). Liver Function Tests: No results for input(s): AST, ALT, ALKPHOS, BILITOT, PROT, ALBUMIN in the last 168 hours. No results for input(s): LIPASE, AMYLASE in the last 168 hours. No results for input(s): AMMONIA in the last 168 hours. Coagulation Profile: No results for input(s): INR, PROTIME in the last 168 hours. Cardiac Enzymes:  Recent Labs Lab 04/24/16 0141  TROPONINI 0.28*   BNP (last 3 results) No results for input(s): PROBNP in the last 8760 hours. HbA1C: No results for input(s): HGBA1C in the last 72 hours. CBG: No results for input(s): GLUCAP in the last 168 hours. Lipid Profile: No results for input(s): CHOL, HDL, LDLCALC, TRIG, CHOLHDL, LDLDIRECT in the last 72 hours. Thyroid Function Tests: No results for input(s): TSH, T4TOTAL, FREET4, T3FREE, THYROIDAB in the last 72 hours. Anemia Panel: No results for input(s): VITAMINB12, FOLATE, FERRITIN, TIBC, IRON, RETICCTPCT in the last 72 hours. Urine analysis:      Component Value Date/Time   COLORURINE RED* 01/11/2016 2232   APPEARANCEUR CLOUDY* 01/11/2016 2232   LABSPEC 1.026 01/11/2016 2232   PHURINE 5.0 01/11/2016 2232   GLUCOSEU NEGATIVE 01/11/2016 2232   HGBUR MODERATE* 01/11/2016 2232   BILIRUBINUR MODERATE* 01/11/2016 2232   KETONESUR 15* 01/11/2016 2232   PROTEINUR >300* 01/11/2016 2232   UROBILINOGEN 1.0 07/16/2014 1600   NITRITE POSITIVE* 01/11/2016 2232   LEUKOCYTESUR SMALL* 01/11/2016 2232   Sepsis Labs: @LABRCNTIP (procalcitonin:4,lacticidven:4) )No results found for this or any previous visit (from the past 240 hour(s)).   Radiological Exams on Admission: Dg Chest 2 View  04/24/2016  CLINICAL DATA:  Dyspnea.  Lower extremity pain and swelling. EXAM: CHEST  2 VIEW COMPARISON:  04/17/2016 FINDINGS: Dual-lumen right jugular central line extending to the cavoatrial junction. Intact transvenous cardiac leads. Unchanged cardiomegaly. Slightly enlarged right pleural effusion. Unchanged small left effusion. Mild airspace opacity in the central right lung and in the right lung base, worsened. This may represent asymmetric alveolar edema. Infectious infiltrate not excluded. IMPRESSION: Mildly worsened central and basilar right lung opacities. This could represent asymmetric alveolar edema or infectious infiltrate. Right pleural effusion is slightly enlarged from 04/17/2016. Electronically Signed   By: Andreas Newport M.D.   On: 04/24/2016 01:53   Dg Chest Port 1 View  04/24/2016  CLINICAL DATA:  Dyspnea and weakness EXAM: PORTABLE CHEST 1 VIEW COMPARISON:  04/24/2016 FINDINGS: Intact transvenous cardiac leads. Dual-lumen right jugular central line extends to the cavoatrial junction. Moderate cardiomegaly persists, unchanged. Moderate right pleural effusion persists, unchanged or mildly larger. Central and basilar right lung opacities have mildly worsened. IMPRESSION: Mild worsening of right central airspace opacity and probable continued  enlargement of right pleural effusion. This likely represents congestive heart failure with asymmetric alveolar  edema. Electronically Signed   By: Andreas Newport M.D.   On: 04/24/2016 03:28   Dg Tibia/fibula Left Port  04/24/2016  CLINICAL DATA:  Left lower extremity pain and swelling for 2 days. No trauma. EXAM: PORTABLE LEFT TIBIA AND FIBULA - 2 VIEW COMPARISON:  None. FINDINGS: Negative for fracture dislocation. There is no bone lesion or bony destruction. There are extensive vascular calcifications. No soft tissue gas or foreign body IMPRESSION: Negative Electronically Signed   By: Andreas Newport M.D.   On: 04/24/2016 03:55    EKG: Independently reviewed. Atrial fibrillation with QRS of 138 ms and QTC of 526 ms.  Assessment/Plan Principal Problem:   Acute respiratory failure with hypoxia (HCC) Active Problems:   CAD (coronary artery disease)   Atrial fibrillation (HCC)   HCAP (healthcare-associated pneumonia)   ESRD (end stage renal disease) on dialysis (HCC)   Leg pain    1. Acute respiratory failure with hypoxia probably a combination of fluid overload and pneumonia - nephrologist was made aware and patient is to get early dialysis. Since patient has progressive pleural effusion and possible pneumonia I have ordered CT chest without contrast to see if there is any loculation. Patient is on empiric antibiotics for possible pneumonia. Follow cultures. 2. Left leg pain with mild ulcerations - no active signs of infection. 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 Will check ABIs and Dopplers. Patient is on empiric antibiotics. 3. ESRD on hemodialysis with hyperkalemia and fluid overload - patient received D50 insulin bicarbonate and calcium gluconate and is about to get early dialysis. Further recommendations per nephrologist. 4. Chronic atrial fibrillation - chest 2 vasc scores 7 and patient is on Coumadin per pharmacy. Continue amiodarone. 5. CAD status post stenting - likely chest  pain. 6. Diabetes mellitus type 2 - patient is on diet and sliding scale coverage. 7. Chronic systolic heart failure last EF measured in February 2017 was 15-20% status post ICD placement - volume management per nephrologist. 8. Chronic anemia - follow CBC.   DVT prophylaxis: On Coumadin. Code Status: Full code.  Family Communication: No family at the bedside.  Disposition Plan: Home.  Consults called: Nephrology.  Admission status: Inpatient. Stepdown.    Rise Patience MD [facility] Pager [phone].  If 7PM-7AM, please contact night-coverage [email or web address] Password [facility]  04/24/2016, 6:40 AM

## 2016-04-24 NOTE — Progress Notes (Signed)
I have seen and assessed patient and hemodialysis on BiPAP and agree with Dr. Moise Boring assessment and plan. Patient is a 65 year old gentleman history of chronic A. fib, ischemic cardiomyopathy status post ICD placement, coronary artery disease status post stenting, type 2 diabetes, end-stage renal disease on hemodialysis Tuesday Thursday Saturdays, chronic combined systolic and diastolic heart failure presented to the ED due to increasing left lower extremity pain. Patient also noted to be hypoxic requiring BiPAP with a significant leukocytosis. Chest x-ray showing airspace opacity and increased right-sided pleural effusion. Patient also noted to be hyperkalemic with potassium of 6.6. Patient given calcium gluconate, sodium bicarbonate, insulin, D50. Patient on empiric IV antibiotics for possible pneumonia. Nephrology consulted and patient currently in hemodialysis. Cardiac enzymes mildly elevated initially however patient denies any acute chest pain. Elevated enzymes may be in part secondary to volume overload. Repeat chest x-ray no morning. Consulted cardiology for further evaluation and management due to patient complex cardiac history.

## 2016-04-24 NOTE — Consult Note (Signed)
Advanced Heart Failure Team Consult Note  Referring Physician: Dr Grandville Silos Primary Physician: Dr Forest Becker  Primary Cardiologist:  Dr Aundra Dubin  Reason for Consultation: Heart Failure  Afib   HPI:   Tony Freeman is a 65 year old man with history of atrial fibrillation, CAD (s/p multiple stents) chronic combined CHF (echo 04/02/2014 with LV EF: 25-30%, diffuse hypokinesis), ischemic cardiomyopathy s/p dual chamber Biotronik ICD placement 06/29/2014, DM2, ESRD, asthma, HLD, abnormal renal US 03/2014 (followed by urology).  He has a h/o PCI LAD/RCA in 2007, PCI LAD/RCA in 2009 at [facility], abnormal lexiscan stress test 06/2013 moderate reversible perfusion abnormality in anterior, septal and apical region, then PCI to LAD 08/2013. [facility] records indicate that coronary angio at that time also showed CTO of RCA and nonobstructive disease in the LCx. Echo at [place] at least back to 01/2013 showed EF 35-40%, then <25% in 0000000 with diastolic dysfunction and elevated RVSP. During his 03/2014 admission at [facility], he was diagnosed with new onset atrial fib-vs-flutter. Echo showed false tendon in LV apex, moderate LVH, EF 25-30%, diffuse HK, mod LAE, trivial TR but no mention of pulm HTN. He also had one troponin that was 0.11 and the rest were negative, felt due to CKD and intracranial etiology with some encephalopathy. He was discharged on Coumadin (along with his Plavix that he was on from prior PCI/stroke). However, it's not totally clear what he did with the Coumadin as it was no longer on his list when he saw a cardiologist in the [place] system at the end of June. The patient thinks his cardiologist took him off it but the note doesn't reflect that (the patient does have some cognitive difficulties since his stroke and lives with his son who manages his medicine). He went on to have ICD placement on 06/29/14. Reading their notes, it appears they were not aware of his 03/2014 diagnosis of atrial fibrillation until it was  picked up on his ICD interrogation in early September at which time he was placed on Edoxaban.   In September 2015 he was admitted increased dyspnea. Found to be in atrial fibrillation with RVR. Had TEE/DC-CV. TEE showed EF 25%, RV mildly dilated and moderately to severely dysfunctional. Successful cardioversion completed.    He seemed to do well until 7/16 when he had a cardiac [place] (apparently PEA, had CPR and epinephrine). He was admitted to [facility]. Coronary angiography was done, and he had PTCA to proximal LAD in-stent restenosis. The RCA had a chronic total occlusion. He developed AKI and progressed to ESRD, now on HD.   Earlier today he presented to [facility] ED with lower extremity pain. Over the last 3 days he developed leg pain with small open wound. In the ED he was febrile with leukocytosis and had hypoxia requiring Bipap. Later transitioned to nasal cannula. Had dialysis this morning. Says his weight at home has been 178 pounds. CXR R pleural effusion with edema. Pertinent admission labs included; WBC 27.8, K 6.6, creatinine 6.73, troponin 0.28>0.34, lactic acid 2.86>5.8, and BNP >4500.    Review of Systems: [y] = yes, [ ]  = no   General: Weight gain [ ] ; Weight loss [ ] ; Anorexia [ ] ; Fatigue [Y ]; Fever [ ] ; Chills [ ] ; Weakness [Y ]  Cardiac: Chest pain/pressure [ ] ; Resting SOB [ ] ; Exertional SOB [ Y]; Orthopnea [ ] ; Pedal Edema [Y ]; Palpitations [ ] ; Syncope [ ] ; Presyncope [ ] ; Paroxysmal nocturnal dyspnea[ ]   Pulmonary: Cough [ ] ;  Wheezing[ ] ; Hemoptysis[ ] ; Sputum [ ] ; Snoring [ ]   GI: Vomiting[ ] ; Dysphagia[ ] ; Melena[ ] ; Hematochezia [ ] ; Heartburn[ ] ; Abdominal pain [ ] ; Constipation [ ] ; Diarrhea [ ] ; BRBPR [ ]   GU: Hematuria[ ] ; Dysuria [ ] ; Nocturia[ ]   Vascular: Pain in legs with walking [ ] ; Pain in feet with lying flat [ ] ; Non-healing sores [ ] ; Stroke [ ] ; TIA [ ] ; Slurred speech [ ] ;  Neuro: Headaches[ ] ; Vertigo[ ] ; Seizures[ ] ; Paresthesias[  ];Blurred vision [ ] ; Diplopia [ ] ; Vision changes [ ]   Ortho/Skin: Arthritis [ ] ; Joint pain [ ] ; Muscle pain [ ] ; Joint swelling [ ] ; Back Pain [ ] ; Rash [ ]   Psych: Depression[ ] ; Anxiety[ ]   Heme: Bleeding problems [ ] ; Clotting disorders [ ] ; Anemia [ ]   Endocrine: Diabetes [Y ]; Thyroid dysfunction[ ]   Home Medications Prior to Admission medications   Medication Sig Start Date End Date Taking? Authorizing Provider  acetaminophen (TYLENOL) 325 MG tablet Take 650 mg by mouth every 8 (eight) hours as needed for headache (pain).    Yes Historical Provider, MD  albuterol (PROVENTIL HFA;VENTOLIN HFA) 108 (90 BASE) MCG/ACT inhaler Inhale 2 puffs into the lungs every 6 (six) hours as needed for wheezing or shortness of breath.   Yes Historical Provider, MD  albuterol (PROVENTIL) (2.5 MG/3ML) 0.083% nebulizer solution Take 2.5 mg by nebulization every 6 (six) hours as needed for wheezing or shortness of breath.   Yes Historical Provider, MD  amiodarone (PACERONE) 200 MG tablet Take 0.5 tablets (100 mg total) by mouth daily. 02/06/16  Yes Larey Dresser, MD  atorvastatin (LIPITOR) 40 MG tablet TAKE ONE TABLET BY MOUTH ONCE DAILY 03/04/16  Yes Will Meredith Leeds, MD  clobetasol ointment (TEMOVATE) [id number] % Apply 1 application topically 2 (two) times daily.   Yes Historical Provider, MD  clopidogrel (PLAVIX) 75 MG tablet TAKE ONE TABLET BY MOUTH ONCE DAILY 01/29/16  Yes Larey Dresser, MD  docusate sodium (COLACE) 100 MG capsule Take 100 mg by mouth daily as needed (constipation).    Yes Historical Provider, MD  gabapentin (NEURONTIN) 300 MG capsule Take 300 mg by mouth at bedtime.   Yes Historical Provider, MD  insulin lispro (HUMALOG) 100 UNIT/ML injection Inject 10 Units into the skin 2 (two) times daily as needed for high blood sugar (CBG >180). Reported on 04/09/2016   Yes Historical Provider, MD  mometasone-formoterol (DULERA) 200-5 MCG/ACT AERO Inhale 2 puffs into the lungs 2 (two) times daily as  needed for wheezing or shortness of breath.   Yes Historical Provider, MD  Multiple Vitamin (MULTIVITAMIN WITH MINERALS) TABS tablet Take 1 tablet by mouth daily.   Yes Historical Provider, MD  mupirocin ointment (BACTROBAN) 2 % Place 1 application into the nose 2 (two) times daily.   Yes Historical Provider, MD  nitroGLYCERIN (NITROSTAT) 0.4 MG SL tablet Place 1 tablet (0.4 mg total) under the tongue every 5 (five) minutes as needed for chest pain. 10/02/15  Yes Larey Dresser, MD  silver sulfADIAZINE (SILVADENE) 1 % cream Apply 1 application topically 2 (two) times daily.   Yes Historical Provider, MD  traMADol (ULTRAM) 50 MG tablet Take 50 mg by mouth every 6 (six) hours as needed for moderate pain.   Yes Historical Provider, MD  warfarin (COUMADIN) 5 MG tablet Take 2.5 mg by mouth daily.   Yes Historical Provider, MD  warfarin (COUMADIN) 5 MG tablet TAKE ONE TABLET BY MOUTH AS DIRECTED Patient  not taking: Reported on 04/24/2016 02/26/16   Larey Dresser, MD    Past Medical History: Past Medical History  Diagnosis Date  . Systolic and diastolic CHF, chronic ([place])     a. Echo at [facility] 11/17/2013 Ef <25%, severe hypokinesis of LV, moderately dilated LA, grade IV/IV diastolic dysfunction with irreversible restrictive pattern of mitral inflow, severe pulm HTN (RVSP >23mmHg), 1-2+ Tony  b. Echo at [facility] 04/02/14 EF 25-30%, moderate concentric LVH, diffuse hypokinesis, moderately dilated LA, no significant Tony observed  . CAD (coronary artery disease)     a. PCI LAD/RCA in 2007  b. PCI LAD/RCA in 2009 at [facility]  c. PCI LAD 08/2013, CTO of RCA and nonobstructive disease in the LCx.  Marland Kitchen Hyperlipidemia   . Dementia   . Ischemic cardiomyopathy     a. s/p Biotronik ICD 06/2014.  Marland Kitchen Retinopathy     bilateral  . HTN (hypertension)   . Abnormal renal ultrasound     a. 03/2014: Abnormal renal ultrasound with left renal lesion and bladder wall thickening and microscopic hematuria. Multiple cysts on prior  MRI. Given inability to use contrast, f/u imaging limited, repeat US 3 months with outpatient uro f/u.  Marland Kitchen Paroxysmal atrial fibrillation (HCC)   . Paroxysmal atrial flutter ([place])   . Anemia   . Myocardial infarction [facility]) July 2016    Heart attack  . Peripheral vascular disease ([place])   . Stroke [facility])     right arm weakness  . Asthma   . CKD (chronic kidney disease), stage IV ([place])     on dialysis T/TH/Sa  . 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 H/O cardiac arrest 04/2015  . Constipation   . AICD (automatic cardioverter/defibrillator) present   . ESRD (end stage renal disease) on dialysis ([place])   . Pneumonia     when he was younger, March 2017    Past Surgical History: Past Surgical History  Procedure Laterality Date  . Coronary stent placement      Multivessel coronary intervention in 2007 and 2009. Ischemic cardiomyopathyPCI LAD/ RCA in 2007 by Dr. Dwyane Dee. PCI LAD/RCA in 2009 at [facility]; PCI LAD 08/2013. Presented with unstable angina pectoris.. Coronary angio demonstrated proximal and distal LAD lesions.65/20 PROMUS stent in proximal LAD// PTCA of distal LAD  . Cardiac defibrillator placement    . Spine surgery      anterior fusion  . Hernia repair    . Tee without cardioversion N/A 07/26/2014    Procedure: TRANSESOPHAGEAL ECHOCARDIOGRAM (TEE);  Surgeon: Larey Dresser, MD;  Location: [place];  Service: Cardiovascular;  Laterality: N/A;  . Cardioversion N/A 07/26/2014    Procedure: CARDIOVERSION;  Surgeon: Larey Dresser, MD;  Location: [place];  Service: Cardiovascular;  Laterality: N/A;  . Right heart catheterization N/A 07/25/2014    Procedure: RIGHT HEART CATH;  Surgeon: Jolaine Artist, MD;  Location: [facility] CATH LAB;  Service: Cardiovascular;  Laterality: N/A;  . Cardiac defibrillator placement  06/2014  . Insertion of dialysis catheter    . Eye surgery Bilateral     for burst blood vessel  . Cardiac catheterization  04/2015  . Coronary  angioplasty    . Colonoscopy w/ polypectomy    . Finger fracture surgery Right     little finger  . Av fistula placement Left 08/03/2015    Procedure: ARTERIOVENOUS (AV) FISTULA CREATION VERSUS BASILIC VEIN TRANSPOSITION VERSUS ARTERIOVENOUS GRAFT INSERTION;  Surgeon: Angelia Mould, MD;  Location: [facility] OR;  Service: Vascular;  Laterality: Left;  . Peripheral vascular catheterization N/A 11/19/2015    Procedure: Fistulagram;  Surgeon: Angelia Mould, MD;  Location: [place] CV LAB;  Service: Cardiovascular;  Laterality: N/A;  . Tee without cardioversion N/A 01/02/2016    Procedure: TRANSESOPHAGEAL ECHOCARDIOGRAM (TEE);  Surgeon: Larey Dresser, MD;  Location: [place];  Service: Cardiovascular;  Laterality: N/A;  . Cardioversion N/A 01/02/2016    Procedure: CARDIOVERSION;  Surgeon: Larey Dresser, MD;  Location: [place];  Service: Cardiovascular;  Laterality: N/A;  . Av fistula placement Right 02/12/2016    Procedure: Creation of Right Arm  Brachiocephalic ARTERIOVENOUS (AV) FISTULA ;  Surgeon: Angelia Mould, MD;  Location: [facility] OR;  Service: Vascular;  Laterality: Right;    Family History: Family History  Problem Relation Age of Onset  . Heart disease      brother  . Kidney disease    . Diabetes Mother     Bilateral Leg  . Heart disease Mother   . Cancer Father     Right neck  . Diabetes Sister   . Hyperlipidemia Sister   . Hypertension Sister   . Heart disease Sister   . Diabetes Brother   . Hyperlipidemia Brother   . Hypertension Brother   . Heart attack Brother   . Heart disease Brother     Social History: Social History   Social History  . Marital Status: Single    Spouse Name: N/A  . Number of Children: N/A  . Years of Education: N/A   Social History Main Topics  . Smoking status: Never Smoker   . Smokeless tobacco: Never Used  . Alcohol Use: No  . Drug Use: No  . Sexual Activity: Not Asked   Other Topics Concern  . None   Social  History Narrative   Going to be living with his son.  He lives with his daughter currently.  2 children.  Disability since CVA    Allergies:  No Known Allergies  Objective:    Vital Signs:   Temp:  [97.4 F (36.3 C)-100 F (37.8 C)] 97.8 F (36.6 C) (06/29 1155) Pulse Rate:  [76-110] 90 (06/29 1223) Resp:  [17-24] 18 (06/29 1223) BP: (105-154)/(53-93) 119/57 mmHg (06/29 1223) SpO2:  [98 %-100 %] 100 % (06/29 1223) Weight:  [169 lb 1.5 oz (76.7 kg)-188 lb (85.276 kg)] 169 lb 1.5 oz (76.7 kg) (06/29 1155)    Weight change: Filed Weights   04/24/16 0450 04/24/16 0715 04/24/16 1155  Weight: 176 lb 2.4 oz (79.9 kg) 173 lb 11.6 oz (78.8 kg) 169 lb 1.5 oz (76.7 kg)    Intake/Output:   Intake/Output Summary (Last 24 hours) at 04/24/16 1234 Last data filed at 04/24/16 1155  Gross per 24 hour  Intake    500 ml  Output   2300 ml  Net  -1800 ml     Physical Exam: General:  Chronically ill appearing. No resp difficulty. On 2 liters [place] HEENT: normal Neck: supple. JVP~10. Carotids 2+ bilat; no bruits. No lymphadenopathy or thryomegaly appreciated. Cor: PMI nondisplaced. Regular rate & rhythm. No rubs, gallops or murmurs. Lungs: RLL RML decreased in bases Abdomen: soft, nontender, ++distended. No hepatosplenomegaly. No bruits or masses. Good bowel sounds. Extremities: no cyanosis, clubbing, rash, RLE 1+ edema and LLE trace edema Neuro: alert & orientedx3, cranial nerves grossly intact. moves all 4 extremities w/o difficulty. Affect pleasant Skin: R and LLE posterior aspect with skin necrosis noted.  Telemetry: SR 70s   Labs: Basic Metabolic Panel:  Recent Labs Lab 04/24/16 0141 04/24/16 0710  NA 128* 130*  K 6.6* 6.7*  CL 95* 96*  CO2 15* 19*  GLUCOSE 252* 113*  BUN 74* 75*  CREATININE 6.73* 7.02*  CALCIUM 10.0 9.8    Liver Function Tests:  Recent Labs Lab 04/24/16 0710  AST 25  ALT 20  ALKPHOS 145*  BILITOT 1.8*  PROT 7.2  ALBUMIN 3.0*   No results for  input(s): LIPASE, AMYLASE in the last 168 hours. No results for input(s): AMMONIA in the last 168 hours.  CBC:  Recent Labs Lab 04/24/16 0141 04/24/16 0710  WBC 27.8* 25.6*  NEUTROABS  --  22.1*  HGB 10.7* 10.5*  HCT 32.8* 32.6*  MCV 98.5 100.0  PLT 188 175    Cardiac Enzymes:  Recent Labs Lab 04/24/16 0141 04/24/16 0710  TROPONINI 0.28* 0.34*    BNP: BNP (last 3 results)  Recent Labs  01/10/16 2006 04/24/16 0501  BNP 4360.3* >4500.0*    ProBNP (last 3 results) No results for input(s): PROBNP in the last 8760 hours.   CBG: No results for input(s): GLUCAP in the last 168 hours.  Coagulation Studies:  Recent Labs  04/24/16 0710  LABPROT 21.5*  INR 1.88*    Other results: EKG:  A fib 102 bpm .  Imaging: Dg Chest 2 View  04/24/2016  CLINICAL DATA:  Dyspnea.  Lower extremity pain and swelling. EXAM: CHEST  2 VIEW COMPARISON:  04/17/2016 FINDINGS: Dual-lumen right jugular central line extending to the cavoatrial junction. Intact transvenous cardiac leads. Unchanged cardiomegaly. Slightly enlarged right pleural effusion. Unchanged small left effusion. Mild airspace opacity in the central right lung and in the right lung base, worsened. This may represent asymmetric alveolar edema. Infectious infiltrate not excluded. IMPRESSION: Mildly worsened central and basilar right lung opacities. This could represent asymmetric alveolar edema or infectious infiltrate. Right pleural effusion is slightly enlarged from 04/17/2016. Electronically Signed   By: Andreas Newport M.D.   On: 04/24/2016 01:53   Dg Chest Port 1 View  04/24/2016  CLINICAL DATA:  Dyspnea and weakness EXAM: PORTABLE CHEST 1 VIEW COMPARISON:  04/24/2016 FINDINGS: Intact transvenous cardiac leads. Dual-lumen right jugular central line extends to the cavoatrial junction. Moderate cardiomegaly persists, unchanged. Moderate right pleural effusion persists, unchanged or mildly larger. Central and basilar right  lung opacities have mildly worsened. IMPRESSION: Mild worsening of right central airspace opacity and probable continued enlargement of right pleural effusion. This likely represents congestive heart failure with asymmetric alveolar edema. Electronically Signed   By: Andreas Newport M.D.   On: 04/24/2016 03:28   Dg Tibia/fibula Left Port  04/24/2016  CLINICAL DATA:  Left lower extremity pain and swelling for 2 days. No trauma. EXAM: PORTABLE LEFT TIBIA AND FIBULA - 2 VIEW COMPARISON:  None. FINDINGS: Negative for fracture dislocation. There is no bone lesion or bony destruction. There are extensive vascular calcifications. No soft tissue gas or foreign body IMPRESSION: Negative Electronically Signed   By: Andreas Newport M.D.   On: 04/24/2016 03:55      Medications:     Current Medications: . amiodarone  100 mg Oral Daily  . atorvastatin  40 mg Oral q1800  . ceFEPime (MAXIPIME) IV  2 g Intravenous Q T,Th,Sa-HD  . clobetasol ointment  1 application Topical BID  . clopidogrel  75 mg Oral Daily  . gabapentin  300 mg Oral QHS  . insulin aspart  0-9 Units Subcutaneous  TID WC  . multivitamin with minerals  1 tablet Oral Daily  . oxyCODONE-acetaminophen      . silver sulfADIAZINE  1 application Topical BID  . sodium chloride flush  3 mL Intravenous Q12H  . sodium thiosulfate infusion for calciphylaxis  25 g Intravenous Q T,Th,Sa-HD  . traMADol      . vancomycin      . vancomycin  1,000 mg Intravenous Q T,Th,Sa-HD  . Warfarin - Pharmacist Dosing Inpatient   Does not apply q1800     Infusions:      Assessment/Plan/Discussion   Tony Freeman had been closely by Dr Aundra Dubin for CP and HF. Admitted with lower extremity pain and had leukocytosis, hypoxia, and A fib. HF team asked to consult by Dr Grandville Silos for A fib, CAD,and HF.   1. Acute Respiratory Failure with Hypoxia Required short term bipap and transitioned to nasal cannula 2. Leukocytosis- WBC on admit 27.8 ? Pneumonia R pleural  effusion. CT of chest pending. Blood cultures pending. On antibiotics per primary team.  3. RLE and LLE Skin Necrosis- ? Calciphylaxis.  4. ESRD- Had HD today . Nephrology following.  5. A/C Systolic Heart Failure- ECHO EF 15-20%. Volume managed by HD. Will need to restart carvedilol 3.125 mg twice a day.  No Ace/Arb with ckd.  6. PAF - Most recent DC-CV March 2017. Earlier today he was in A fib but now back in NSR 70s. Continue amiodarone 100 mg daily. On coumadin 7. CAD: S/P Cardiac arrest 7/16 with cath and cutting balloon angioplasty to proximal LAD in-stent restenosis.Cycling troponin 0.28> 0.34 Continue Plavix/warfarin for 1 year as long as he tolerates then likely stop Plavix in 7/17. - Continue lipitor 40 mg daily. Length of Stay: 0  Amy Clegg NP-C  04/24/2016, 12:34 PM  Advanced Heart Failure Team Pager [phone] (M-F; 7a - 4p)  Please contact [facility] for night-coverage after hours (4p -7a ) and weekends on [email or web address]  Patient seen with NP, agree with the above note.  1. Acute/chronic systolic CHF: EF 0000000 on 3/17 TEE, has Biotronik ICD.  Ischemic cardiomyopathy.  He is volume overloaded on exam with pulmonary edema on CXR.  He has chronically been volume overloaded.  Dependent on HD for volume management.  - HD for volume removal.  May need four day a week HD as outpatient to manage volume.  - Continue Coreg 3.125 mg bid.  - Significant abdominal distention, will get [place] to assess for ascites, paracentesis if there is a significant amount of fluid.  2. Atrial fibrillation: Paroxysmal.  He was in atrial fibrillation at admission but now back in NSR.  Continue warfarin and amiodarone.  3. CAD: No chest pain.  Suspect mild increase in troponin is demand ischemia with volume overload.  Continue Plavix until 7/17, continue statin.  4. ESRD: HD per nephrolology.  5. 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  Per nephrology.  6. ID: WBCs markedly elevated.  Temperature to 100  here.  Source of infection possibly PNA underlying pulmonary edema versus skin source from calciphylaxis lesions.   - On broad spectrum abx, vancomycin/cefepime.   [name] 04/24/2016 2:38 PM

## 2016-04-24 NOTE — Progress Notes (Signed)
Pt arrived to the unit from the ED. RT was present to place BiPAP on the patient. Vital signs were taken. At this time pt BP is 88/60 (69). The patient is alert and oriented at this time and all other vitals signs are sable.  Will continue to monitor.

## 2016-04-24 NOTE — Progress Notes (Signed)
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 Warfarin has been implicated as a risk factor for the development of calciphylaxis. It is theorized this is due to warfarin's inhibition of Vit K dependent carboxylation of MGP (mineral-binding extracellular matrix protein) which is dependent on this mechanism.   Anticoagulation options were discussed with Dr. Jonnie Finner and plans are to transition the patient to apixaban. Apixaban does not have strong data for use in ESRD patients (small PK/PD studies and underpowered retrospective reviews) however it mechanistically does not worsen calciphylaxis and can be utilized while monitoring closely for signs/symptoms of bleeding.  Given the patient's age<80 and wt>60 kg - the dosing is still apixaban 5 mg twice daily.  Plan 1. D/c warfarin and daily PT/INR 2. Start apixaban 5 mg twice daily  3. Pharmacy will monitor peripherally for any bleeding issues  Alycia Rossetti, PharmD, BCPS Clinical Pharmacist Pager: [phone] 04/24/2016 1:53 PM

## 2016-04-24 NOTE — Progress Notes (Signed)
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 Being admitted for PNA, hypoxia. K 6.6. Will require HD sooner rather than later.  Usual HD [facility] TTS EDW 78 (leaving around 77) 3K2.25 Ca (will need 2K while here) UF profile 4 Heparin 2600 units Mircera 100 mcg Q2wks (last dosed 6/20)  Sodium thiosulfate 25 gm every HD (for calciphylaxis) HD orders in On call RN aware  Jamal Maes, MD [place] Pager 04/24/2016, 3:37 AM

## 2016-04-24 NOTE — Progress Notes (Signed)
ANTICOAGULATION and ANTIBIOTIC CONSULT NOTE - Initial Consult  Pharmacy Consult for vancomycin and cefepime and coumadin Indication: sepsis and afib  No Known Allergies  Patient Measurements: Height: 5\' 6"  (167.6 cm) Weight: 176 lb 2.4 oz (79.9 kg) IBW/kg (Calculated) : 63.8   Vital Signs: Temp: 99.2 F (37.3 C) (06/29 0450) Temp Source: Axillary (06/29 0450) BP: 142/65 mmHg (06/29 0450) Pulse Rate: 76 (06/29 0450)  Labs:  Recent Labs  04/24/16 0141  HGB 10.7*  HCT 32.8*  PLT 188  CREATININE 6.73*  TROPONINI 0.28*    Estimated Creatinine Clearance: 11.2 mL/min (by C-G formula based on Cr of 6.73).   Medical History: Past Medical History  Diagnosis Date  . Systolic and diastolic CHF, chronic ([place])     a. Echo at [facility] 11/17/2013 Ef <25%, severe hypokinesis of LV, moderately dilated LA, grade IV/IV diastolic dysfunction with irreversible restrictive pattern of mitral inflow, severe pulm HTN (RVSP >49mmHg), 1-2+ MR  b. Echo at [facility] 04/02/14 EF 25-30%, moderate concentric LVH, diffuse hypokinesis, moderately dilated LA, no significant MR observed  . CAD (coronary artery disease)     a. PCI LAD/RCA in 2007  b. PCI LAD/RCA in 2009 at [facility]  c. PCI LAD 08/2013, CTO of RCA and nonobstructive disease in the LCx.  Marland Kitchen Hyperlipidemia   . Dementia   . Ischemic cardiomyopathy     a. s/p Biotronik ICD 06/2014.  Marland Kitchen Retinopathy     bilateral  . HTN (hypertension)   . Abnormal renal ultrasound     a. 03/2014: Abnormal renal ultrasound with left renal lesion and bladder wall thickening and microscopic hematuria. Multiple cysts on prior MRI. Given inability to use contrast, f/u imaging limited, repeat US 3 months with outpatient uro f/u.  Marland Kitchen Paroxysmal atrial fibrillation (HCC)   . Paroxysmal atrial flutter ([place])   . Anemia   . Myocardial infarction [facility]) July 2016    Heart attack  . Peripheral vascular disease ([place])   . Stroke [facility])     right arm weakness  . Asthma   . CKD  (chronic kidney disease), stage IV ([place])     on dialysis T/TH/Sa  . 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 H/O cardiac arrest 04/2015  . Constipation   . AICD (automatic cardioverter/defibrillator) present   . ESRD (end stage renal disease) on dialysis ([place])   . Pneumonia     when he was younger, March 2017    Medications:  Prescriptions prior to admission  Medication Sig Dispense Refill Last Dose  . acetaminophen (TYLENOL) 325 MG tablet Take 650 mg by mouth every 8 (eight) hours as needed for headache (pain).    04/23/2016 at Unknown time  . albuterol (PROVENTIL HFA;VENTOLIN HFA) 108 (90 BASE) MCG/ACT inhaler Inhale 2 puffs into the lungs every 6 (six) hours as needed for wheezing or shortness of breath.   04/23/2016 at Unknown time  . albuterol (PROVENTIL) (2.5 MG/3ML) 0.083% nebulizer solution Take 2.5 mg by nebulization every 6 (six) hours as needed for wheezing or shortness of breath.   04/23/2016 at Unknown time  . amiodarone (PACERONE) 200 MG tablet Take 0.5 tablets (100 mg total) by mouth daily. 15 tablet 3 04/23/2016 at Unknown time  . atorvastatin (LIPITOR) 40 MG tablet TAKE ONE TABLET BY MOUTH ONCE DAILY 30 tablet 8 04/23/2016 at Unknown time  . clobetasol ointment (TEMOVATE) AB-123456789 % Apply 1 application topically 2 (two) times daily.   04/23/2016 at Unknown time  .  clopidogrel (PLAVIX) 75 MG tablet TAKE ONE TABLET BY MOUTH ONCE DAILY 30 tablet 6 04/23/2016 at Unknown time  . docusate sodium (COLACE) 100 MG capsule Take 100 mg by mouth daily as needed (constipation).    04/23/2016 at Unknown time  . gabapentin (NEURONTIN) 300 MG capsule Take 300 mg by mouth at bedtime.   04/23/2016 at Unknown time  . insulin lispro (HUMALOG) 100 UNIT/ML injection Inject 10 Units into the skin 2 (two) times daily as needed for high blood sugar (CBG >180). Reported on 04/09/2016   04/23/2016 at Unknown time  . mometasone-formoterol (DULERA) 200-5 MCG/ACT AERO Inhale 2 puffs into the lungs 2 (two)  times daily as needed for wheezing or shortness of breath.   04/23/2016 at Unknown time  . Multiple Vitamin (MULTIVITAMIN WITH MINERALS) TABS tablet Take 1 tablet by mouth daily.   04/23/2016 at Unknown time  . mupirocin ointment (BACTROBAN) 2 % Place 1 application into the nose 2 (two) times daily.   04/23/2016 at Unknown time  . nitroGLYCERIN (NITROSTAT) 0.4 MG SL tablet Place 1 tablet (0.4 mg total) under the tongue every 5 (five) minutes as needed for chest pain. 30 tablet 1 unknown  . silver sulfADIAZINE (SILVADENE) 1 % cream Apply 1 application topically 2 (two) times daily.   04/23/2016 at Unknown time  . traMADol (ULTRAM) 50 MG tablet Take 50 mg by mouth every 6 (six) hours as needed for moderate pain.   04/23/2016 at Unknown time  . warfarin (COUMADIN) 5 MG tablet Take 2.5 mg by mouth daily.   04/23/2016 at 2000  . warfarin (COUMADIN) 5 MG tablet TAKE ONE TABLET BY MOUTH AS DIRECTED (Patient not taking: Reported on 04/24/2016) 30 tablet 3 Not Taking at Unknown time    Assessment: 65 yo M admitted with PNA, hypoxia, hyperkalemia.  Pharmacy consulted to dose vanc/cefepime for sepsis and coumadin for afib.   Wt 80 kg, ESRD TTS pt, WBC elevated at 27.8, Lactate 2.85, temp 100 He was given cefepime 2 gm at 0314 am and vanc 1500 mg at 0500 am  - a little low for HD pt loading dose but but scheduled to be dialyzed any minute Home coumadin dose 2.5 mg daily, last dose 6/28 at 2000.  Hg 10.7, pltc 188. INR has not yet been drawn yet.   Goal of Therapy:  INR 2-3 Monitor platelets by anticoagulation protocol: Yes  Vancomycin trough 15-25 mcg/ml for sepsis   Plan: vancomycin 1 gm after HD on TTS Cefepime 2 gm after HD on TTS F/u INR and order coumadin Daily INR  Eudelia Bunch, Pharm.D. BP:[id number] 04/24/2016 7:03 AM

## 2016-04-24 NOTE — ED Notes (Signed)
The pt is c/o bi-lateral leg pain since yesterday  He reports that he has sores all over his l;egs  Dialysis pt that was dialyzed yesterday.  Dialysis catheter in his chest

## 2016-04-24 NOTE — ED Provider Notes (Signed)
CSN: [id number]     Arrival date & time 04/24/16  0127 History  By signing my name below, I, Altamease Oiler, attest that this documentation has been prepared under the direction and in the presence of Everlene Balls, MD. Electronically Signed: Altamease Oiler, ED Scribe. 04/24/2016. 3:24 AM  Chief Complaint  Patient presents with  . Leg Pain   The history is provided by the patient and a relative. No language interpreter was used.   Tony Freeman is a 65 y.o. male with PMHx of dementia, CKD on T/TH/S hemodialysis, CAD s/p stent placement, CHF, ischemic cardiomyopathy s/p dual chamber Biotronik ICD placement,  HLD, HTN, DM  who presents to the Emergency Department with his son complaining of constant, 8/10 in severity,  Left leg pain with onset yesterday. He has been unable to walk due to the pain. Pt last had dialysis 2 days ago and it was a full treatment. He and his son note that his legs are less swollen than usual. For the last few weeks he has had wounds developing at the back of his legs. The patient's son states that he does make a small amount of urine and it was darker than usual today. He is also being treated for a UTI and a fungal infection at the groin. Pt denies chest pain, SOB,  fever, and chills.   Past Medical History  Diagnosis Date  . Systolic and diastolic CHF, chronic ([place])     a. Echo at [facility] 11/17/2013 Ef <25%, severe hypokinesis of LV, moderately dilated LA, grade IV/IV diastolic dysfunction with irreversible restrictive pattern of mitral inflow, severe pulm HTN (RVSP >60mmHg), 1-2+ MR  b. Echo at [facility] 04/02/14 EF 25-30%, moderate concentric LVH, diffuse hypokinesis, moderately dilated LA, no significant MR observed  . CAD (coronary artery disease)     a. PCI LAD/RCA in 2007  b. PCI LAD/RCA in 2009 at [facility]  c. PCI LAD 08/2013, CTO of RCA and nonobstructive disease in the LCx.  Marland Kitchen Hyperlipidemia   . Dementia   . Ischemic cardiomyopathy     a. s/p Biotronik  ICD 06/2014.  Marland Kitchen Retinopathy     bilateral  . HTN (hypertension)   . Abnormal renal ultrasound     a. 03/2014: Abnormal renal ultrasound with left renal lesion and bladder wall thickening and microscopic hematuria. Multiple cysts on prior MRI. Given inability to use contrast, f/u imaging limited, repeat US 3 months with outpatient uro f/u.  Marland Kitchen Paroxysmal atrial fibrillation (HCC)   . Paroxysmal atrial flutter ([place])   . Anemia   . Myocardial infarction [facility]) July 2016    Heart attack  . Peripheral vascular disease ([place])   . Stroke [facility])     right arm weakness  . Asthma   . CKD (chronic kidney disease), stage IV ([place])     on dialysis T/TH/Sa  . 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 H/O cardiac arrest 04/2015  . Constipation   . AICD (automatic cardioverter/defibrillator) present   . ESRD (end stage renal disease) on dialysis ([place])   . Pneumonia     when he was younger, March 2017   Past Surgical History  Procedure Laterality Date  . Coronary stent placement      Multivessel coronary intervention in 2007 and 2009. Ischemic cardiomyopathyPCI LAD/ RCA in 2007 by Dr. Dwyane Dee. PCI LAD/RCA in 2009 at [facility]; PCI LAD 08/2013. Presented with unstable angina pectoris.. Coronary angio demonstrated proximal and  distal LAD lesions.24/20 PROMUS stent in proximal LAD// PTCA of distal LAD  . Cardiac defibrillator placement    . Spine surgery      anterior fusion  . Hernia repair    . Tee without cardioversion N/A 07/26/2014    Procedure: TRANSESOPHAGEAL ECHOCARDIOGRAM (TEE);  Surgeon: Larey Dresser, MD;  Location: [place];  Service: Cardiovascular;  Laterality: N/A;  . Cardioversion N/A 07/26/2014    Procedure: CARDIOVERSION;  Surgeon: Larey Dresser, MD;  Location: [place];  Service: Cardiovascular;  Laterality: N/A;  . Right heart catheterization N/A 07/25/2014    Procedure: RIGHT HEART CATH;  Surgeon: Jolaine Artist, MD;  Location: [facility] CATH LAB;  Service:  Cardiovascular;  Laterality: N/A;  . Cardiac defibrillator placement  06/2014  . Insertion of dialysis catheter    . Eye surgery Bilateral     for burst blood vessel  . Cardiac catheterization  04/2015  . Coronary angioplasty    . Colonoscopy w/ polypectomy    . Finger fracture surgery Right     little finger  . Av fistula placement Left 08/03/2015    Procedure: ARTERIOVENOUS (AV) FISTULA CREATION VERSUS BASILIC VEIN TRANSPOSITION VERSUS ARTERIOVENOUS GRAFT INSERTION;  Surgeon: Angelia Mould, MD;  Location: [place];  Service: Vascular;  Laterality: Left;  . Peripheral vascular catheterization N/A 11/19/2015    Procedure: Fistulagram;  Surgeon: Angelia Mould, MD;  Location: [place] CV LAB;  Service: Cardiovascular;  Laterality: N/A;  . Tee without cardioversion N/A 01/02/2016    Procedure: TRANSESOPHAGEAL ECHOCARDIOGRAM (TEE);  Surgeon: Larey Dresser, MD;  Location: [place];  Service: Cardiovascular;  Laterality: N/A;  . Cardioversion N/A 01/02/2016    Procedure: CARDIOVERSION;  Surgeon: Larey Dresser, MD;  Location: [place];  Service: Cardiovascular;  Laterality: N/A;  . Av fistula placement Right 02/12/2016    Procedure: Creation of Right Arm  Brachiocephalic ARTERIOVENOUS (AV) FISTULA ;  Surgeon: Angelia Mould, MD;  Location[facility];  Service: Vascular;  Laterality: Right;   Family History  Problem Relation Age of Onset  . Heart disease      brother  . Kidney disease    . Diabetes Mother     Bilateral Leg  . Heart disease Mother   . Cancer Father     Right neck  . Diabetes Sister   . Hyperlipidemia Sister   . Hypertension Sister   . Heart disease Sister   . Diabetes Brother   . Hyperlipidemia Brother   . Hypertension Brother   . Heart attack Brother   . Heart disease Brother    Social History  Substance Use Topics  . Smoking status: Never Smoker   . Smokeless tobacco: Never Used  . Alcohol Use: No    Review of Systems  10 Systems  reviewed and all are negative for acute change except as noted in the HPI.  Allergies  Review of patient's allergies indicates no known allergies.  Home Medications   Prior to Admission medications   Medication Sig Start Date End Date Taking? Authorizing Provider  acetaminophen (TYLENOL) 325 MG tablet Take 650 mg by mouth every 8 (eight) hours as needed for headache (pain).    Yes Historical Provider, MD  albuterol (PROVENTIL HFA;VENTOLIN HFA) 108 (90 BASE) MCG/ACT inhaler Inhale 2 puffs into the lungs every 6 (six) hours as needed for wheezing or shortness of breath.   Yes Historical Provider, MD  albuterol (PROVENTIL) (2.5 MG/3ML) 0.083% nebulizer solution Take 2.5 mg by nebulization  every 6 (six) hours as needed for wheezing or shortness of breath.   Yes Historical Provider, MD  amiodarone (PACERONE) 200 MG tablet Take 0.5 tablets (100 mg total) by mouth daily. 02/06/16  Yes Larey Dresser, MD  atorvastatin (LIPITOR) 40 MG tablet TAKE ONE TABLET BY MOUTH ONCE DAILY 03/04/16  Yes Will Meredith Leeds, MD  clobetasol ointment (TEMOVATE) AB-123456789 % Apply 1 application topically 2 (two) times daily.   Yes Historical Provider, MD  clopidogrel (PLAVIX) 75 MG tablet TAKE ONE TABLET BY MOUTH ONCE DAILY 01/29/16  Yes Larey Dresser, MD  docusate sodium (COLACE) 100 MG capsule Take 100 mg by mouth daily as needed (constipation).    Yes Historical Provider, MD  gabapentin (NEURONTIN) 300 MG capsule Take 300 mg by mouth at bedtime.   Yes Historical Provider, MD  insulin lispro (HUMALOG) 100 UNIT/ML injection Inject 10 Units into the skin 2 (two) times daily as needed for high blood sugar (CBG >180). Reported on 04/09/2016   Yes Historical Provider, MD  mometasone-formoterol (DULERA) 200-5 MCG/ACT AERO Inhale 2 puffs into the lungs 2 (two) times daily as needed for wheezing or shortness of breath.   Yes Historical Provider, MD  Multiple Vitamin (MULTIVITAMIN WITH MINERALS) TABS tablet Take 1 tablet by mouth daily.    Yes Historical Provider, MD  mupirocin ointment (BACTROBAN) 2 % Place 1 application into the nose 2 (two) times daily.   Yes Historical Provider, MD  nitroGLYCERIN (NITROSTAT) 0.4 MG SL tablet Place 1 tablet (0.4 mg total) under the tongue every 5 (five) minutes as needed for chest pain. 10/02/15  Yes Larey Dresser, MD  silver sulfADIAZINE (SILVADENE) 1 % cream Apply 1 application topically 2 (two) times daily.   Yes Historical Provider, MD  traMADol (ULTRAM) 50 MG tablet Take 50 mg by mouth every 6 (six) hours as needed for moderate pain.   Yes Historical Provider, MD  warfarin (COUMADIN) 5 MG tablet Take 2.5 mg by mouth daily.   Yes Historical Provider, MD  warfarin (COUMADIN) 5 MG tablet TAKE ONE TABLET BY MOUTH AS DIRECTED Patient not taking: Reported on 04/24/2016 02/26/16   Larey Dresser, MD   BP 131/68 mmHg  Pulse 110  Temp(Src) 97.4 F (36.3 C) (Oral)  Resp 24  Ht 5\' 6"  (1.676 m)  Wt 188 lb (85.276 kg)  BMI 30.36 kg/m2  SpO2 100% Physical Exam  Constitutional: He is oriented to person, place, and time. Vital signs are normal. He appears well-developed and well-nourished.  Non-toxic appearance. He does not appear ill. No distress.  HENT:  Head: Normocephalic and atraumatic.  Nose: Nose normal.  Mouth/Throat: Oropharynx is clear and moist. No oropharyngeal exudate.  Eyes: Conjunctivae and EOM are normal. Pupils are equal, round, and reactive to light. No scleral icterus.  Neck: Normal range of motion. Neck supple. No tracheal deviation, no edema, no erythema and normal range of motion present. No thyroid mass and no thyromegaly present.  Cardiovascular: Regular rhythm, S1 normal, S2 normal, normal heart sounds, intact distal pulses and normal pulses.  Tachycardia present.  Exam reveals no gallop and no friction rub.   No murmur heard. Pulmonary/Chest: Breath sounds normal. Tachypnea noted. He is in respiratory distress. He has no wheezes. He has no rhonchi. He has no rales.  Right  HD tunneled catheter Increased WOB Nasal canula in place   Abdominal: Soft. Normal appearance and bowel sounds are normal. He exhibits distension. He exhibits no ascites and no mass. There is no  hepatosplenomegaly. There is no tenderness. There is no rebound, no guarding and no CVA tenderness.  Genitourinary:  Foul smelling ulcer noted on the penis, uncircumcised   Musculoskeletal: Normal range of motion. He exhibits edema and tenderness.  BLE edema LLE erythema and TTP but no warmth   Lymphadenopathy:    He has no cervical adenopathy.  Neurological: He is alert and oriented to person, place, and time. He has normal strength. No cranial nerve deficit or sensory deficit.  Skin: Skin is warm, dry and intact. No petechiae and no rash noted. He is not diaphoretic. No erythema. No pallor.  Psychiatric: He has a normal mood and affect. His behavior is normal. Judgment normal.  Nursing note and vitals reviewed.   ED Course  Procedures (including critical care time) DIAGNOSTIC STUDIES: Oxygen Saturation is 100% on 5L, adequate by my interpretation.    COORDINATION OF CARE: 2:43 AM Discussed treatment plan which includes lab work, CXR, EKG, abx, calcium gluconate, and IVF with the pt and his son at bedside and they agreed to plan.  3:09 AM-Consult complete with Dr. Lorrene Reid (Nephrology). Patient case explained and discussed.She will dialyze the pt at 6 AM.  Call ended at 3:12 AM  3:21 AM-Consult complete with Dr. Hal Hope [facility]). Patient case explained and discussed. Agrees to admit patient for further evaluation and treatment. Call ended at [place] - Abnormal; Notable for the following:    Sodium 128 (*)    Potassium 6.6 (*)    Chloride 95 (*)    CO2 15 (*)    Glucose, Bld 252 (*)    BUN 74 (*)    Creatinine, Ser 6.73 (*)    GFR calc non Af Amer 8 (*)    GFR calc Af Amer 9 (*)    Anion gap 18 (*)    All other components  within normal limits  CBC - Abnormal; Notable for the following:    WBC 27.8 (*)    RBC 3.33 (*)    Hemoglobin 10.7 (*)    HCT 32.8 (*)    RDW 17.4 (*)    All other components within normal limits  TROPONIN I - Abnormal; Notable for the following:    Troponin I 0.28 (*)    All other components within normal limits  BRAIN NATRIURETIC PEPTIDE - Abnormal; Notable for the following:    B Natriuretic Peptide >4500.0 (*)    All other components within normal limits  I-STAT CG4 LACTIC ACID, ED - Abnormal; Notable for the following:    Lactic Acid, Venous 2.86 (*)    All other components within normal limits  CULTURE, BLOOD (ROUTINE X 2)  CULTURE, BLOOD (ROUTINE X 2)  MRSA PCR SCREENING  TROPONIN I  TROPONIN I  TROPONIN I  CBC WITH DIFFERENTIAL/PLATELET  COMPREHENSIVE METABOLIC PANEL  LACTIC ACID, PLASMA  LACTIC ACID, PLASMA  PROCALCITONIN  PROTIME-INR  APTT    Imaging Review Dg Chest 2 View  04/24/2016  CLINICAL DATA:  Dyspnea.  Lower extremity pain and swelling. EXAM: CHEST  2 VIEW COMPARISON:  04/17/2016 FINDINGS: Dual-lumen right jugular central line extending to the cavoatrial junction. Intact transvenous cardiac leads. Unchanged cardiomegaly. Slightly enlarged right pleural effusion. Unchanged small left effusion. Mild airspace opacity in the central right lung and in the right lung base, worsened. This may represent asymmetric alveolar edema. Infectious infiltrate not excluded. IMPRESSION: Mildly worsened central and basilar right lung opacities. This could represent  asymmetric alveolar edema or infectious infiltrate. Right pleural effusion is slightly enlarged from 04/17/2016. Electronically Signed   By: Andreas Newport M.D.   On: 04/24/2016 01:53   Dg Chest Port 1 View  04/24/2016  CLINICAL DATA:  Dyspnea and weakness EXAM: PORTABLE CHEST 1 VIEW COMPARISON:  04/24/2016 FINDINGS: Intact transvenous cardiac leads. Dual-lumen right jugular central line extends to the cavoatrial  junction. Moderate cardiomegaly persists, unchanged. Moderate right pleural effusion persists, unchanged or mildly larger. Central and basilar right lung opacities have mildly worsened. IMPRESSION: Mild worsening of right central airspace opacity and probable continued enlargement of right pleural effusion. This likely represents congestive heart failure with asymmetric alveolar edema. Electronically Signed   By: Andreas Newport M.D.   On: 04/24/2016 03:28   Dg Tibia/fibula Left Port  04/24/2016  CLINICAL DATA:  Left lower extremity pain and swelling for 2 days. No trauma. EXAM: PORTABLE LEFT TIBIA AND FIBULA - 2 VIEW COMPARISON:  None. FINDINGS: Negative for fracture dislocation. There is no bone lesion or bony destruction. There are extensive vascular calcifications. No soft tissue gas or foreign body IMPRESSION: Negative Electronically Signed   By: Andreas Newport M.D.   On: 04/24/2016 03:55   I have personally reviewed and evaluated these images and lab results as part of my medical decision-making.   EKG Interpretation None      MDM   Final diagnoses:  None   Patient presents to the emergency department for leg pain, however I am more concerned about his shortness of breath and hypoxia.  He was immediately placed on [place] and O2 sat rose to 95%, however he continued to be in resp distress with tachypnea.  He was placed on bipap.  CXR reveals pna and WBC is nearly 30.   Code sepsis was called and patient receive abx.  IVF held as the patient has normal BP and lactate is not very high.   I spoke with nephrology Dr. Lorrene Reid who will dialyze first thing in the morning.  Dr. Hal Hope wil admit to step down.     CRITICAL CARE Performed by: Everlene Balls   Total critical care time: 45 minutes - respiratory distress, sepsis, hyperkalemia  Critical care time was exclusive of separately billable procedures and treating other patients.  Critical care was necessary to treat or prevent imminent  or life-threatening deterioration.  Critical care was time spent personally by me on the following activities: development of treatment plan with patient and/or surrogate as well as nursing, discussions with consultants, evaluation of patient's response to treatment, examination of patient, obtaining history from patient or surrogate, ordering and performing treatments and interventions, ordering and review of laboratory studies, ordering and review of radiographic studies, pulse oximetry and re-evaluation of patient's condition.    I personally performed the services described in this documentation, which was scribed in my presence. The recorded information has been reviewed and is accurate.     Everlene Balls, MD 04/24/16 [phone]

## 2016-04-25 ENCOUNTER — Inpatient Hospital Stay (HOSPITAL_COMMUNITY): Payer: Medicare HMO

## 2016-04-25 DIAGNOSIS — R188 Other ascites: Secondary | ICD-10-CM | POA: Diagnosis present

## 2016-04-25 DIAGNOSIS — E1165 Type 2 diabetes mellitus with hyperglycemia: Secondary | ICD-10-CM

## 2016-04-25 DIAGNOSIS — I5043 Acute on chronic combined systolic (congestive) and diastolic (congestive) heart failure: Secondary | ICD-10-CM

## 2016-04-25 DIAGNOSIS — E1122 Type 2 diabetes mellitus with diabetic chronic kidney disease: Secondary | ICD-10-CM

## 2016-04-25 DIAGNOSIS — D72829 Elevated white blood cell count, unspecified: Secondary | ICD-10-CM

## 2016-04-25 LAB — CBC WITH DIFFERENTIAL/PLATELET
BASOS ABS: 0 10*3/uL (ref 0.0–0.1)
Basophils Relative: 0 %
EOS ABS: 0 10*3/uL (ref 0.0–0.7)
Eosinophils Relative: 0 %
HCT: 30.8 % — ABNORMAL LOW (ref 39.0–52.0)
Hemoglobin: 9.8 g/dL — ABNORMAL LOW (ref 13.0–17.0)
LYMPHS ABS: 1.5 10*3/uL (ref 0.7–4.0)
Lymphocytes Relative: 9 %
MCH: 32.2 pg (ref 26.0–34.0)
MCHC: 31.8 g/dL (ref 30.0–36.0)
MCV: 101.3 fL — AB (ref 78.0–100.0)
MONO ABS: 1.2 10*3/uL — AB (ref 0.1–1.0)
Monocytes Relative: 7 %
Neutro Abs: 14.5 10*3/uL — ABNORMAL HIGH (ref 1.7–7.7)
Neutrophils Relative %: 84 %
PLATELETS: 164 10*3/uL (ref 150–400)
RBC: 3.04 MIL/uL — AB (ref 4.22–5.81)
RDW: 17.7 % — ABNORMAL HIGH (ref 11.5–15.5)
WBC: 17.2 10*3/uL — AB (ref 4.0–10.5)

## 2016-04-25 LAB — HEPATIC FUNCTION PANEL
ALBUMIN: 2.9 g/dL — AB (ref 3.5–5.0)
ALK PHOS: 137 U/L — AB (ref 38–126)
ALT: 19 U/L (ref 17–63)
AST: 26 U/L (ref 15–41)
BILIRUBIN TOTAL: 1.7 mg/dL — AB (ref 0.3–1.2)
Bilirubin, Direct: 0.7 mg/dL — ABNORMAL HIGH (ref 0.1–0.5)
Indirect Bilirubin: 1 mg/dL — ABNORMAL HIGH (ref 0.3–0.9)
Total Protein: 7.6 g/dL (ref 6.5–8.1)

## 2016-04-25 LAB — RENAL FUNCTION PANEL
ALBUMIN: 2.9 g/dL — AB (ref 3.5–5.0)
Anion gap: 16 — ABNORMAL HIGH (ref 5–15)
BUN: 41 mg/dL — AB (ref 6–20)
CALCIUM: 9.9 mg/dL (ref 8.9–10.3)
CO2: 24 mmol/L (ref 22–32)
CREATININE: 4.68 mg/dL — AB (ref 0.61–1.24)
Chloride: 94 mmol/L — ABNORMAL LOW (ref 101–111)
GFR calc Af Amer: 14 mL/min — ABNORMAL LOW (ref >60)
GFR, EST NON AFRICAN AMERICAN: 12 mL/min — AB (ref >60)
Glucose, Bld: 130 mg/dL — ABNORMAL HIGH (ref 65–99)
PHOSPHORUS: 5.3 mg/dL — AB (ref 2.5–4.6)
Potassium: 4.9 mmol/L (ref 3.5–5.1)
Sodium: 134 mmol/L — ABNORMAL LOW (ref 135–145)

## 2016-04-25 LAB — BODY FLUID CELL COUNT WITH DIFFERENTIAL
Lymphs, Fluid: 34 %
MONOCYTE-MACROPHAGE-SEROUS FLUID: 50 % (ref 50–90)
Neutrophil Count, Fluid: 16 % (ref 0–25)
Total Nucleated Cell Count, Fluid: 2003 cu mm — ABNORMAL HIGH (ref 0–1000)

## 2016-04-25 LAB — GLUCOSE, SEROUS FLUID: GLUCOSE FL: 120 mg/dL

## 2016-04-25 LAB — GLUCOSE, CAPILLARY
GLUCOSE-CAPILLARY: 225 mg/dL — AB (ref 65–99)
Glucose-Capillary: 84 mg/dL (ref 65–99)
Glucose-Capillary: 137 mg/dL — ABNORMAL HIGH (ref 65–99)

## 2016-04-25 LAB — LACTATE DEHYDROGENASE, PLEURAL OR PERITONEAL FLUID: LD, Fluid: 134 U/L — ABNORMAL HIGH (ref 3–23)

## 2016-04-25 LAB — GRAM STAIN

## 2016-04-25 LAB — MAGNESIUM: Magnesium: 2.1 mg/dL (ref 1.7–2.4)

## 2016-04-25 LAB — LACTATE DEHYDROGENASE: LDH: 195 U/L — ABNORMAL HIGH (ref 98–192)

## 2016-04-25 LAB — PROTEIN, BODY FLUID: TOTAL PROTEIN, FLUID: 4.9 g/dL

## 2016-04-25 MED ORDER — HEPARIN SODIUM (PORCINE) 1000 UNIT/ML DIALYSIS
2600.0000 [IU] | Freq: Once | INTRAMUSCULAR | Status: DC
  Filled 2016-04-25: qty 3

## 2016-04-25 MED ORDER — LIDOCAINE HCL 1 % IJ SOLN
INTRAMUSCULAR | Status: AC
  Filled 2016-04-25: qty 20

## 2016-04-25 MED ORDER — ALBUMIN HUMAN 25 % IV SOLN
25.0000 g | Freq: Once | INTRAVENOUS | Status: AC
  Administered 2016-04-25: 25 g via INTRAVENOUS
  Filled 2016-04-25: qty 100

## 2016-04-25 MED ORDER — SODIUM CHLORIDE 0.9 % IV SOLN: 100.0000 mL | INTRAVENOUS | Status: DC | PRN

## 2016-04-25 MED ORDER — HEPARIN SODIUM (PORCINE) 1000 UNIT/ML DIALYSIS
1000.0000 [IU] | INTRAMUSCULAR | Status: DC | PRN
  Filled 2016-04-25: qty 1

## 2016-04-25 MED ORDER — ALTEPLASE 2 MG IJ SOLR: 2.0000 mg | Freq: Once | INTRAMUSCULAR | Status: DC | PRN

## 2016-04-25 MED ORDER — OXYCODONE-ACETAMINOPHEN 5-325 MG PO TABS
ORAL_TABLET | ORAL | Status: AC
  Filled 2016-04-25: qty 1

## 2016-04-25 MED ORDER — LIDOCAINE-PRILOCAINE 2.5-2.5 % EX CREA: 1.0000 "application " | TOPICAL_CREAM | CUTANEOUS | Status: DC | PRN

## 2016-04-25 MED ORDER — PENTAFLUOROPROP-TETRAFLUOROETH EX AERO
1.0000 | INHALATION_SPRAY | CUTANEOUS | Status: DC | PRN
Start: 2016-04-25 — End: 2016-04-30

## 2016-04-25 MED ORDER — LIDOCAINE HCL (PF) 1 % IJ SOLN: 5.0000 mL | INTRAMUSCULAR | Status: DC | PRN

## 2016-04-25 MED ORDER — OXYCODONE-ACETAMINOPHEN 5-325 MG PO TABS
1.0000 | ORAL_TABLET | ORAL | Status: DC | PRN
  Administered 2016-04-25 – 2016-04-29 (×9): 1 via ORAL
  Filled 2016-04-25 (×7): qty 1

## 2016-04-25 NOTE — Progress Notes (Signed)
Advanced Heart Failure Rounding Note   Subjective:    Complaining of leg pain. Denies SOB/CP    Objective:   Weight Range:  Vital Signs:   Temp:  [97.6 F (36.4 C)-98.8 F (37.1 C)] 98.1 F (36.7 C) (06/30 1216) Pulse Rate:  [74-90] 78 (06/30 1216) Resp:  [16-20] 16 (06/30 1216) BP: (105-132)/(46-77) 116/55 mmHg (06/30 1216) SpO2:  [90 %-99 %] 99 % (06/30 1216) Weight:  [167 lb 15.9 oz (76.2 kg)-175 lb 4.3 oz (79.5 kg)] 167 lb 15.9 oz (76.2 kg) (06/30 1100)    Weight change: Filed Weights   04/24/16 1155 04/25/16 0725 04/25/16 1100  Weight: 169 lb 1.5 oz (76.7 kg) 175 lb 4.3 oz (79.5 kg) 167 lb 15.9 oz (76.2 kg)    Intake/Output:   Intake/Output Summary (Last 24 hours) at 04/25/16 1355 Last data filed at 04/25/16 1100  Gross per 24 hour  Intake      3 ml  Output   3500 ml  Net  -3497 ml     Physical Exam: General: Chronically ill aappearing. No resp difficulty SItting on the side of the bed.. No resp difficulty.HEENT: normal Neck: supple. JVP 7-8. Carotids 2+ bilat; no bruits. No lymphadenopathy or thryomegaly appreciated. Cor: PMI nondisplaced. Regular rate & rhythm. No rubs, gallops or murmurs. Lungs: RLL crackles. Decreased LLL Abdomen: soft, nontender, ++distended. No hepatosplenomegaly. No bruits or masses. Good bowel sounds. Extremities: no cyanosis, clubbing, rash, RLE 1+ edema and LLE trace edema Neuro: alert & orientedx3, cranial nerves grossly intact. moves all 4 extremities w/o difficulty. Affect pleasant Skin: R and LLE posterior aspect with skin necrosis noted.  Telemetry: SR 80s   Labs: Basic Metabolic Panel:  Recent Labs Lab 04/24/16 0141 04/24/16 0710 04/24/16 1353 04/25/16 0216  NA 128* 130*  --  134*  K 6.6* 6.7*  --  4.9  CL 95* 96*  --  94*  CO2 15* 19*  --  24  GLUCOSE 252* 113*  --  130*  BUN 74* 75*  --  41*  CREATININE 6.73* 7.02*  --  4.68*  CALCIUM 10.0 9.8  --  9.9  MG  --   --   --  2.1  PHOS  --   --  4.0 5.3*     Liver Function Tests:  Recent Labs Lab 04/24/16 0710 04/25/16 0216 04/25/16 1000  AST 25  --  26  ALT 20  --  19  ALKPHOS 145*  --  137*  BILITOT 1.8*  --  1.7*  PROT 7.2  --  7.6  ALBUMIN 3.0* 2.9* 2.9*   No results for input(s): LIPASE, AMYLASE in the last 168 hours. No results for input(s): AMMONIA in the last 168 hours.  CBC:  Recent Labs Lab 04/24/16 0141 04/24/16 0710 04/25/16 0216  WBC 27.8* 25.6* 17.2*  NEUTROABS  --  22.1* 14.5*  HGB 10.7* 10.5* 9.8*  HCT 32.8* 32.6* 30.8*  MCV 98.5 100.0 101.3*  PLT 188 175 164    Cardiac Enzymes:  Recent Labs Lab 04/24/16 0141 04/24/16 0710 04/24/16 1353 04/24/16 1822  TROPONINI 0.28* 0.34* 0.39* 0.40*    BNP: BNP (last 3 results)  Recent Labs  01/10/16 2006 04/24/16 0501  BNP 4360.3* >4500.0*    ProBNP (last 3 results) No results for input(s): PROBNP in the last 8760 hours.    Other results:  Imaging: Dg Chest 2 View  04/24/2016  CLINICAL DATA:  Dyspnea.  Lower extremity pain and swelling. EXAM: CHEST  2 VIEW COMPARISON:  04/17/2016 FINDINGS: Dual-lumen right jugular central line extending to the cavoatrial junction. Intact transvenous cardiac leads. Unchanged cardiomegaly. Slightly enlarged right pleural effusion. Unchanged small left effusion. Mild airspace opacity in the central right lung and in the right lung base, worsened. This may represent asymmetric alveolar edema. Infectious infiltrate not excluded. IMPRESSION: Mildly worsened central and basilar right lung opacities. This could represent asymmetric alveolar edema or infectious infiltrate. Right pleural effusion is slightly enlarged from 04/17/2016. Electronically Signed   By: Andreas Newport M.D.   On: 04/24/2016 01:53   Ct Chest Wo Contrast  04/24/2016  CLINICAL DATA:  Further evaluate pleural effusion. Cough. History of MI in 2016. History of hypertension. EXAM: CT CHEST WITHOUT CONTRAST TECHNIQUE: Multidetector CT imaging of the chest  was performed following the standard protocol without IV contrast. COMPARISON:  04/24/2016 FINDINGS: Cardiovascular: Extensive coronary artery calcifications are present. Heart is mildly enlarged. No pericardial effusion. Left-sided AICD leads extend into the right atrium and right ventricle. A a right-sided dialysis catheter tip extends to the level of the SVC right atrial junction. Mediastinum/Nodes: The visualized portion of the thyroid gland has a normal appearance. Small mediastinal lymph nodes are present. Largest is identified in the region the precarinal station, measuring approximate 1.0 cm. Lungs/Pleura: There are bilateral pleural effusions, right greater than left. Bibasilar atelectasis is present. Parenchymal calcifications are identified within the lung bases, right greater than left likely representing calcified granulomata. No suspicious pulmonary nodules are identified. There is smooth septal thickening in the lungs, consistent with mild pulmonary edema. Upper Abdomen: Nodular contour of the liver raising the question of cirrhosis. Calcified granulomata are identified within the liver. There is dense atherosclerotic calcification of the abdominal aorta and its branches. Ascites. Calcified gallstone or gallstones. Musculoskeletal: Mild mid thoracic spondylosis. No suspicious lytic or blastic lesions are identified. IMPRESSION: 1. Small to moderate right pleural effusion. 2. Small left pleural effusion. 3. Bibasilar atelectasis. 4. Coronary artery disease and mild cardiomegaly. 5. Catheters and pacemaker as described. 6. Abdominal ascites and probable cirrhosis. 7. Cholelithiasis. Electronically Signed   By: Nolon Nations M.D.   On: 04/24/2016 15:17   US Abdomen Limited  04/24/2016  CLINICAL DATA:  Abdominal distention for 1 year. Evaluate for ascites. EXAM: LIMITED ABDOMEN ULTRASOUND FOR ASCITES TECHNIQUE: Limited ultrasound survey for ascites was performed in all four abdominal quadrants.  COMPARISON:  11/21/2015 CT FINDINGS: Ultrasound is performed of the 4 quadrants, demonstrating moderate ascites throughout the abdomen and pelvis. IMPRESSION: Moderate ascites. Electronically Signed   By: Nolon Nations M.D.   On: 04/24/2016 15:29   Dg Chest Port 1 View  04/24/2016  CLINICAL DATA:  Dyspnea and weakness EXAM: PORTABLE CHEST 1 VIEW COMPARISON:  04/24/2016 FINDINGS: Intact transvenous cardiac leads. Dual-lumen right jugular central line extends to the cavoatrial junction. Moderate cardiomegaly persists, unchanged. Moderate right pleural effusion persists, unchanged or mildly larger. Central and basilar right lung opacities have mildly worsened. IMPRESSION: Mild worsening of right central airspace opacity and probable continued enlargement of right pleural effusion. This likely represents congestive heart failure with asymmetric alveolar edema. Electronically Signed   By: Andreas Newport M.D.   On: 04/24/2016 03:28   Dg Tibia/fibula Left Port  04/24/2016  CLINICAL DATA:  Left lower extremity pain and swelling for 2 days. No trauma. EXAM: PORTABLE LEFT TIBIA AND FIBULA - 2 VIEW COMPARISON:  None. FINDINGS: Negative for fracture dislocation. There is no bone lesion or bony destruction. There are extensive vascular calcifications. No soft  tissue gas or foreign body IMPRESSION: Negative Electronically Signed   By: Andreas Newport M.D.   On: 04/24/2016 03:55      Medications:     Scheduled Medications: . albumin human  25 g Intravenous Once  . amiodarone  100 mg Oral Daily  . apixaban  5 mg Oral BID  . atorvastatin  40 mg Oral q1800  . carvedilol  3.125 mg Oral BID WC  . ceFEPime (MAXIPIME) IV  2 g Intravenous Q T,Th,Sa-HD  . clobetasol ointment  1 application Topical BID  . clopidogrel  75 mg Oral Daily  . gabapentin  300 mg Oral QHS  . [START ON 04/26/2016] heparin  2,600 Units Dialysis Once in dialysis  . insulin aspart  0-9 Units Subcutaneous TID WC  . multivitamin with  minerals  1 tablet Oral Daily  . silver sulfADIAZINE  1 application Topical BID  . sodium chloride flush  3 mL Intravenous Q12H  . sodium thiosulfate infusion for calciphylaxis  25 g Intravenous Q T,Th,Sa-HD  . vancomycin  1,000 mg Intravenous Q T,Th,Sa-HD     Infusions:     PRN Medications:  sodium chloride, sodium chloride, acetaminophen **OR** acetaminophen, albuterol, alteplase, camphor-menthol **AND** hydrOXYzine, docusate sodium, docusate sodium, feeding supplement (NEPRO CARB STEADY), heparin, lidocaine (PF), lidocaine-prilocaine, mometasone-formoterol, nitroGLYCERIN, ondansetron **OR** ondansetron (ZOFRAN) IV, oxyCODONE-acetaminophen, pentafluoroprop-tetrafluoroeth, sorbitol, traMADol, zolpidem   Assessment/Plan/Discussion   Mr Lessman had been closely by Dr Aundra Dubin for CP and HF. Admitted with lower extremity pain and had leukocytosis, hypoxia, and A fib. HF team asked to consult by Dr Grandville Silos for A fib, CAD,and HF.   1. Acute Respiratory Failure with Hypoxia Required short term bipap and transitioned to nasal cannula. On room air.  2. Leukocytosis- ? HCAP WBC on admit 27.8. Todays WBC 17.2  Pneumonia R pleural effusion. CT of chest pending. Blood cultures pending. On antibiotics per primary team.  3. RLE and LLE Skin Necrosis- ? Calciphylaxis. Venous dopplers negative for DVT. ABI pending.  4. ESRD- Had HD today . Nephrology following.  5. A/C Systolic Heart Failure- ECHO EF 15-20%. Volume managed by HD. Continue carvedilol 3.125 mg twice a day.  No Ace/Arb with ckd.  6. PAF - Most recent DC-CV March 2017. On admit in A fib but now back in NSR 70-80s. Continue amiodarone 100 mg daily. Off coumadin and on eliquis per nephrology. Due to possible correlation with calciphylaxis.  7. CAD: S/P Cardiac arrest 7/16 with cath and cutting balloon angioplasty to proximal LAD in-stent restenosis.Troponin 0.28> 0.34>0.4 . No CP today  Continue Plavix/warfarin for 1 year as long as  he tolerates then likely stop Plavix in 7/17. - Continue lipitor 40 mg daily. 8. Ascites: ? Paracentesis  Length of Stay: 1  Amy Clegg NP-C  04/25/2016, 1:55 PM  Advanced Heart Failure Team Pager [phone] (M-F; 7a - 4p)  Please contact [facility] for night-coverage after hours (4p -7a ) and weekends on [email or web address]  Patient seen with NP, agree with the above note.  He had large volume paracentesis today, breathing better.  Still some volume overload on exam, controlling with HD.    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    Remains in NSR on amiodarone, continue.   [name] 04/25/2016

## 2016-04-25 NOTE — Care Management Note (Addendum)
Case Management Note  Patient Details  Name: Tony Freeman MRN: [id number] Date of Birth: [birth year]  Subjective/Objective:  Per CMA:   ELIQUIS  5 MG  BID  30    AND  2.5 MG BID 30   COVER- YES                   YES  CO-PAY- $ 47.00                 $ 47.00  TIER- 3 DRUG                   3 DRUG  PRIOR APPROVAL - NO           NO  PHARMACY : [facility] AND [facility]   APIXABAN - NOT ON FORMULARY     PROVIDED PT WITH CARD FOR 30-DAY FREE TRIAL                        Expected Discharge Plan:  [place]  In-House Referral:  Clinical Social Work  Discharge planning Services  CM Consult, Medication Assistance  Status of Service: Completed  Girard Cooter, RN 04/25/2016, 6:47 AM

## 2016-04-25 NOTE — Progress Notes (Signed)
Nutrition Brief Note  Patient identified on the Malnutrition Screening Tool (MST) Report.  Weight fluctuations more than likely given fluid status, ESRD on HD.  Wt Readings from Last 15 Encounters:  04/25/16 167 lb 15.9 oz (76.2 kg)  04/17/16 175 lb (79.379 kg)  04/09/16 177 lb 6.4 oz (80.468 kg)  04/09/16 176 lb 3.2 oz (79.924 kg)  03/14/16 177 lb (80.287 kg)  03/03/16 180 lb (81.647 kg)  02/12/16 182 lb (82.555 kg)  01/19/16 182 lb 5.1 oz (82.7 kg)  12/18/15 181 lb (82.101 kg)  11/19/15 180 lb (81.647 kg)  11/16/15 175 lb 12 oz (79.72 kg)  11/07/15 177 lb 4.8 oz (80.423 kg)  10/23/15 182 lb (82.555 kg)  10/05/15 179 lb 12.8 oz (81.557 kg)  09/26/15 178 lb (80.74 kg)    Body mass index is 27.13 kg/(m^2). Patient meets criteria for Overweight based on current BMI.   Current diet order is Renal/Carbohydrate Modified.  Labs and medications reviewed.   No nutrition interventions warranted at this time. If nutrition issues arise, please consult RD.   Arthur Holms, RD, LDN Pager #: [phone] After-Hours Pager #: [phone]

## 2016-04-25 NOTE — Progress Notes (Signed)
PROGRESS NOTE    Tony Freeman  [id number] DOB: 06/15/1951 DOA: 04/24/2016 PCP: Tanya Nones, MD    Brief Narrative:  Tony Freeman is a 65 y.o. male with with history of chronic atrial fibrillation, ischemic cardiomyopathy status post ICD placement, CAD status post stenting, diabetes mellitus type 2, ESRD on hemodialysis on Tuesday Thursday since Saturday and chronic combined systolic and diastolic heart failure presents to the ER because of increasing pain in his left lower extremity. Patient states he has been having pain in his left lower extremity over the last 2-3 days. Denies any fever or chills. Denies any trauma. In the ER patient was found to be febrile with labs showing leukocytosis and patient was hypoxic requiring BiPAP. Chest x-ray was showing airspace opacity and right-sided pleural effusion which was worsening. Patient labs also show Hyprkalemia for which patient was given calcium gluconate and sodium bicarbonate and insulin D50. On call nephrologist Dr. Lorrene Reid was consulted by ER physician and patient is to get early dialysis. Patient states he is mildly short of breath and has some nonproductive cough otherwise denies any chest pain. On exam patient's both lower extremity edematous with mild tenderness in the left lower extremity extending from the left knee to the foot. There is ulceration on the posterior aspect of his left leg. No active discharge. 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   ED Course: Was started on antibiotics and was given insulin and calcium gluconate sodium bicarbonate and D50  Assessment & Plan:   Principal Problem:   Acute respiratory failure with hypoxia (HCC) Active Problems:   Acute on chronic combined systolic and diastolic heart failure (HCC)   DM (diabetes mellitus), type 2, uncontrolled, with  renal complications (HCC)   CAD (coronary artery disease)   Lower extremity edema   Atrial fibrillation (HCC)   HCAP (healthcare-associated pneumonia)   ESRD (end stage renal disease) on dialysis (HCC)   Leg pain   Hyperkalemia   Hypervolemia   Pleural effusion   Ascites   Leukocytosis  #1 acute respiratory failure with hypoxia secondary to volume overload/ acute on chronic systolic heart failure and probable healthcare associated pneumonia/ Patient currently in hemodialysis with clinical improvement. Patient status post hemodialysis yesterday with 2.3 L removed. Patient is -1.79 L during this hospitalization. Cardiac enzymes which was cycled were mildly elevated. Patient with 2-D echo from 11/29/2015 with a EF of 15-20% with severe diffuse hypokinesis and moderate to severe pulmonary artery systolic pressure with a PA peak pressure of 65 mmHg. Patient was on BiPAP on admission currently on nasal cannula. Patient may require 4 days of hemodialysis per week per cardiology recommendations. Continue Coreg, Lipitor. Patient has been pancultured results pending. Urine Legionella antigen and urine pneumococcus antigen pending. Continue empiric IV vancomycin and IV cefepime. Nephrology and cardiology following.  #2 end-stage renal disease on hemodialysis Patient with hemodialysis yesterday and today. Patient with -1.79 L over the past 24 hours. Patient with some clinical improvement. Continue sodium thiosulfate for calciphylaxis. Per nephrology.  #3 hyperkalemia Resolved. Status post sodium bicarbonate, D50, insulin, calcium gluconate. Patient received hemodialysis yesterday and today.  #4 probable healthcare associated pneumonia Per chest x-ray. Patient also noted to be hypoxic on admission requiring BiPAP with a significant leukocytosis. WBC trending down. Urine cultures pending. Blood cultures pending. Urine Legionella antigen pending. Urine pneumococcus antigen pending. Continue oxygen, empiric  IV vancomycin  and IV cefepime. Follow.  #5 acute on chronic systolic heart failure Cardiac enzymes mildly elevated which was felt to be secondary to CHF. Patient denies any chest pain. 2-D echo from 11/29/2015 with a EF of 15-20% with severe diffuse hypokinesis and moderate to severe pulmonary artery systolic pressure with a PA peak pressure of 65 mmHg. Patient is -1.79 L during this hospitalization. Patient received hemodialysis yesterday and today. Patient has been seen in consult by cardiology who are recommending possible four-day hemodialysis per week to keep fluid off. Continue Coreg, Lipitor, Plavix. Patient has been switched from Coumadin to apixaban per nephrology. Unable to use ACE inhibitor secondary to end-stage renal disease. Cardiology following and appreciate their input and recommendations.  #6 abdominal ascites Diagnostic and therapeutic paracentesis. We'll give albumin 25% 25 g 30 minutes prior to paracentesis. Patient with no abdominal pain and doubt SBP. Will get diagnostic studies. Continue empiric IV antibiotics.  #7 paroxysmal atrial fibrillation Currently rate controlled. Continue Coreg and amiodarone. Coumadin has been changed to apixaban per nephrology. Continue Plavix. Cardiology following and appreciate input and recommendations.  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 Pain management.  #9 diabetes mellitus type 2 CBGs have ranged from 98-175. Check a hemoglobin A1c. Sliding scale insulin.  #10 coronary artery disease/elevated troponin Elevated troponin likely secondary to CHF. Patient denies any active chest pain. Continue Coreg, Lipitor, Plavix, amiodarone. Coumadin has been changed to apixaban per nephrology. Cardiology following and appreciate input and recommendations.  #11 leukocytosis Maybe secondary to probable healthcare associated pneumonia. Patient has been pancultured results pending.  Urinalysis pending. WBC trending down. Patient with noted ascites and a such will get a therapeutic and diagnostic paracentesis. Continue empiric IV vancomycin and IV cefepime.  #12 right pleural effusion Patient with chronic right pleural effusion. Will repeat chest x-ray in the morning as patient on hemodialysis to see Ms. any improvement. Patient also on empiric IV antibiotics. Follow.   DVT prophylaxis: apixaban Code Status: Full Family Communication: Updated patient. No family at bedside. Disposition Plan: If continued respiratory improvement could possibly transfer from the step down unit.   Consultants:   Nephrology: Dr. Lorrene Reid 04/24/2016  Cardiology: Dr. Aundra Dubin 04/24/2016  Procedures:   Lower extremity Dopplers 04/24/2016  Abdominal ultrasound 04/24/2016  Plain films of the left tib-fib 04/24/2016  Chest x-ray 04/24/2016  CT chest 04/24/2016  Antimicrobials:   IV cefepime 04/24/2016  IV  vancomycin 04/24/2016   Subjective: Patient denies any chest pain. Improvement with shortness of breath. Patient still with complaints of left lower extremity pain.  Objective: Filed Vitals:   04/24/16 2315 04/25/16 0444 04/25/16 0725 04/25/16 0730  BP: 131/70 115/77 129/60 124/72  Pulse: 76 77 74 74  Temp: 98.8 F (37.1 C) 98.8 F (37.1 C) 98.1 F (36.7 C)   TempSrc: Oral Oral Oral   Resp: 20 19 20    Height:      Weight:   79.5 kg (175 lb 4.3 oz)   SpO2: 92% 90%      Intake/Output Summary (Last 24 hours) at 04/25/16 0909 Last data filed at 04/24/16 2109  Gross per 24 hour  Intake      3 ml  Output   2300 ml  Net  -2297 ml   Filed Weights   04/24/16 0715 04/24/16 1155 04/25/16 0725  Weight: 78.8 kg (173 lb 11.6 oz) 76.7 kg (169 lb 1.5 oz) 79.5 kg (175 lb 4.3 oz)    Examination:  General exam: Appears calm  and comfortable. In HD. Respiratory system: Decreased breath sounds in the bases.  Respiratory effort normal. Cardiovascular system: S1 & S2 heard,  RRR. No JVD, murmurs, rubs, gallops or clicks. 2 + BLE edema. Gastrointestinal system: Abdomen is distended, soft and nontender. No organomegaly or masses felt. Normal bowel sounds heard. Central nervous system: Alert and oriented. No focal neurological deficits. Extremities: Symmetric 5 x 5 power. Skin: LLE with ulceration on posterior aspect. Psychiatry: Judgement and insight appear normal. Mood & affect appropriate.     Data Reviewed: I have personally reviewed following labs and imaging studies  CBC:  Recent Labs Lab 04/24/16 0141 04/24/16 0710 04/25/16 0216  WBC 27.8* 25.6* 17.2*  NEUTROABS  --  22.1* 14.5*  HGB 10.7* 10.5* 9.8*  HCT 32.8* 32.6* 30.8*  MCV 98.5 100.0 101.3*  PLT 188 175 [id number]   Basic Metabolic Panel:  Recent Labs Lab 04/24/16 0141 04/24/16 0710 04/24/16 1353 04/25/16 0216  NA 128* 130*  --  134*  K 6.6* 6.7*  --  4.9  CL 95* 96*  --  94*  CO2 15* 19*  --  24  GLUCOSE 252* 113*  --  130*  BUN 74* 75*  --  41*  CREATININE 6.73* 7.02*  --  4.68*  CALCIUM 10.0 9.8  --  9.9  MG  --   --   --  2.1  PHOS  --   --  4.0 5.3*   GFR: Estimated Creatinine Clearance: 16 mL/min (by C-G formula based on Cr of 4.68). Liver Function Tests:  Recent Labs Lab 04/24/16 0710 04/25/16 0216  AST 25  --   ALT 20  --   ALKPHOS 145*  --   BILITOT 1.8*  --   PROT 7.2  --   ALBUMIN 3.0* 2.9*   No results for input(s): LIPASE, AMYLASE in the last 168 hours. No results for input(s): AMMONIA in the last 168 hours. Coagulation Profile:  Recent Labs Lab 04/24/16 0710  INR 1.88*   Cardiac Enzymes:  Recent Labs Lab 04/24/16 0141 04/24/16 0710 04/24/16 1353 04/24/16 1822  TROPONINI 0.28* 0.34* 0.39* 0.40*   BNP (last 3 results) No results for input(s): PROBNP in the last 8760 hours. HbA1C: No results for input(s): HGBA1C in the last 72 hours. CBG:  Recent Labs Lab 04/24/16 1658 04/24/16 2142  GLUCAP 98 175*   Lipid Profile: No results for  input(s): CHOL, HDL, LDLCALC, TRIG, CHOLHDL, LDLDIRECT in the last 72 hours. Thyroid Function Tests: No results for input(s): TSH, T4TOTAL, FREET4, T3FREE, THYROIDAB in the last 72 hours. Anemia Panel: No results for input(s): VITAMINB12, FOLATE, FERRITIN, TIBC, IRON, RETICCTPCT in the last 72 hours. Sepsis Labs:  Recent Labs Lab 04/24/16 0154 04/24/16 0710 04/24/16 1353  PROCALCITON  --  5.81  --   LATICACIDVEN 2.86* 1.4 1.3    Recent Results (from the past 240 hour(s))  MRSA PCR Screening     Status: None   Collection Time: 04/24/16  5:05 AM  Result Value Ref Range Status   MRSA by PCR NEGATIVE NEGATIVE Final    Comment:        The GeneXpert MRSA Assay (FDA approved for NASAL specimens only), is one component of a comprehensive MRSA colonization surveillance program. It is not intended to diagnose MRSA infection nor to guide or monitor treatment for MRSA infections.          Radiology Studies: Dg Chest 2 View  04/24/2016  CLINICAL DATA:  Dyspnea.  Lower extremity pain  and swelling. EXAM: CHEST  2 VIEW COMPARISON:  04/17/2016 FINDINGS: Dual-lumen right jugular central line extending to the cavoatrial junction. Intact transvenous cardiac leads. Unchanged cardiomegaly. Slightly enlarged right pleural effusion. Unchanged small left effusion. Mild airspace opacity in the central right lung and in the right lung base, worsened. This may represent asymmetric alveolar edema. Infectious infiltrate not excluded. IMPRESSION: Mildly worsened central and basilar right lung opacities. This could represent asymmetric alveolar edema or infectious infiltrate. Right pleural effusion is slightly enlarged from 04/17/2016. Electronically Signed   By: Andreas Newport M.D.   On: 04/24/2016 01:53   Ct Chest Wo Contrast  04/24/2016  CLINICAL DATA:  Further evaluate pleural effusion. Cough. History of MI in 2016. History of hypertension. EXAM: CT CHEST WITHOUT CONTRAST TECHNIQUE: Multidetector  CT imaging of the chest was performed following the standard protocol without IV contrast. COMPARISON:  04/24/2016 FINDINGS: Cardiovascular: Extensive coronary artery calcifications are present. Heart is mildly enlarged. No pericardial effusion. Left-sided AICD leads extend into the right atrium and right ventricle. A a right-sided dialysis catheter tip extends to the level of the SVC right atrial junction. Mediastinum/Nodes: The visualized portion of the thyroid gland has a normal appearance. Small mediastinal lymph nodes are present. Largest is identified in the region the precarinal station, measuring approximate 1.0 cm. Lungs/Pleura: There are bilateral pleural effusions, right greater than left. Bibasilar atelectasis is present. Parenchymal calcifications are identified within the lung bases, right greater than left likely representing calcified granulomata. No suspicious pulmonary nodules are identified. There is smooth septal thickening in the lungs, consistent with mild pulmonary edema. Upper Abdomen: Nodular contour of the liver raising the question of cirrhosis. Calcified granulomata are identified within the liver. There is dense atherosclerotic calcification of the abdominal aorta and its branches. Ascites. Calcified gallstone or gallstones. Musculoskeletal: Mild mid thoracic spondylosis. No suspicious lytic or blastic lesions are identified. IMPRESSION: 1. Small to moderate right pleural effusion. 2. Small left pleural effusion. 3. Bibasilar atelectasis. 4. Coronary artery disease and mild cardiomegaly. 5. Catheters and pacemaker as described. 6. Abdominal ascites and probable cirrhosis. 7. Cholelithiasis. Electronically Signed   By: Nolon Nations M.D.   On: 04/24/2016 15:17   US Abdomen Limited  04/24/2016  CLINICAL DATA:  Abdominal distention for 1 year. Evaluate for ascites. EXAM: LIMITED ABDOMEN ULTRASOUND FOR ASCITES TECHNIQUE: Limited ultrasound survey for ascites was performed in all four  abdominal quadrants. COMPARISON:  11/21/2015 CT FINDINGS: Ultrasound is performed of the 4 quadrants, demonstrating moderate ascites throughout the abdomen and pelvis. IMPRESSION: Moderate ascites. Electronically Signed   By: Nolon Nations M.D.   On: 04/24/2016 15:29   Dg Chest Port 1 View  04/24/2016  CLINICAL DATA:  Dyspnea and weakness EXAM: PORTABLE CHEST 1 VIEW COMPARISON:  04/24/2016 FINDINGS: Intact transvenous cardiac leads. Dual-lumen right jugular central line extends to the cavoatrial junction. Moderate cardiomegaly persists, unchanged. Moderate right pleural effusion persists, unchanged or mildly larger. Central and basilar right lung opacities have mildly worsened. IMPRESSION: Mild worsening of right central airspace opacity and probable continued enlargement of right pleural effusion. This likely represents congestive heart failure with asymmetric alveolar edema. Electronically Signed   By: Andreas Newport M.D.   On: 04/24/2016 03:28   Dg Tibia/fibula Left Port  04/24/2016  CLINICAL DATA:  Left lower extremity pain and swelling for 2 days. No trauma. EXAM: PORTABLE LEFT TIBIA AND FIBULA - 2 VIEW COMPARISON:  None. FINDINGS: Negative for fracture dislocation. There is no bone lesion or bony destruction. There are  extensive vascular calcifications. No soft tissue gas or foreign body IMPRESSION: Negative Electronically Signed   By: Andreas Newport M.D.   On: 04/24/2016 03:55        Scheduled Meds: . albumin human  25 g Intravenous Once  . amiodarone  100 mg Oral Daily  . apixaban  5 mg Oral BID  . atorvastatin  40 mg Oral q1800  . carvedilol  3.125 mg Oral BID WC  . ceFEPime (MAXIPIME) IV  2 g Intravenous Q T,Th,Sa-HD  . clobetasol ointment  1 application Topical BID  . clopidogrel  75 mg Oral Daily  . gabapentin  300 mg Oral QHS  . [START ON 04/26/2016] heparin  2,600 Units Dialysis Once in dialysis  . insulin aspart  0-9 Units Subcutaneous TID WC  . multivitamin with  minerals  1 tablet Oral Daily  . oxyCODONE-acetaminophen      . silver sulfADIAZINE  1 application Topical BID  . sodium chloride flush  3 mL Intravenous Q12H  . sodium thiosulfate infusion for calciphylaxis  25 g Intravenous Q T,Th,Sa-HD  . vancomycin  1,000 mg Intravenous Q T,Th,Sa-HD   Continuous Infusions:    LOS: 1 day    Time spent: 26 mins    THOMPSON,DANIEL, MD [facility] Pager [phone]  If 7PM-7AM, please contact night-coverage [email or web address] Password [email or web address] 04/25/2016, 9:09 AM

## 2016-04-25 NOTE — Procedures (Signed)
Ultrasound-guided diagnostic and therapeutic paracentesis performed yielding 6 liters of tan chylous-colored fluid. No immediate complications.  6L was max for first time paracentesis.  Tony Freeman E 4:41 PM 04/25/2016

## 2016-04-25 NOTE — Progress Notes (Signed)
10:11 AM Arrived to patient's room for ABI. Patient is in HD at this time. Will check back as time allows for completion of test.   Landry Mellow, RDMS, RVT Vascular lab

## 2016-04-25 NOTE — Progress Notes (Signed)
[facility]  Patient Status: Active (receiving services up to time of hospitalization)  [facility] is providing the following services: RN  If patient discharges after hours, please call [phone].   Janae Sauce 04/25/2016, 11:08 AM

## 2016-04-25 NOTE — Progress Notes (Signed)
[facility] Progress Note   Subjective: no sob or cough  Filed Vitals:   04/25/16 0800 04/25/16 0830 04/25/16 0900 04/25/16 0930  BP: 126/46 114/46 112/63 108/51  Pulse: 82 80 82 81  Temp:      TempSrc:      Resp: 20 20 20 20   Height:      Weight:      SpO2:   95%     Inpatient medications: . albumin human  25 g Intravenous Once  . amiodarone  100 mg Oral Daily  . apixaban  5 mg Oral BID  . atorvastatin  40 mg Oral q1800  . carvedilol  3.125 mg Oral BID WC  . ceFEPime (MAXIPIME) IV  2 g Intravenous Q T,Th,Sa-HD  . clobetasol ointment  1 application Topical BID  . clopidogrel  75 mg Oral Daily  . gabapentin  300 mg Oral QHS  . [START ON 04/26/2016] heparin  2,600 Units Dialysis Once in dialysis  . insulin aspart  0-9 Units Subcutaneous TID WC  . multivitamin with minerals  1 tablet Oral Daily  . oxyCODONE-acetaminophen      . silver sulfADIAZINE  1 application Topical BID  . sodium chloride flush  3 mL Intravenous Q12H  . sodium thiosulfate infusion for calciphylaxis  25 g Intravenous Q T,Th,Sa-HD  . vancomycin  1,000 mg Intravenous Q T,Th,Sa-HD     sodium chloride, sodium chloride, acetaminophen **OR** acetaminophen, albuterol, alteplase, camphor-menthol **AND** hydrOXYzine, docusate sodium, docusate sodium, feeding supplement (NEPRO CARB STEADY), heparin, lidocaine (PF), lidocaine-prilocaine, mometasone-formoterol, nitroGLYCERIN, ondansetron **OR** ondansetron (ZOFRAN) IV, oxyCODONE-acetaminophen, pentafluoroprop-tetrafluoroeth, sorbitol, traMADol, zolpidem  Exam: Alert no distress, no jvd Chest clear bilat Irreg irreg no mrg Abd distended, nontender +bs GU glans has patchy skin discoloration, no actual gangrene that I can see, not sig tender, no erythema Ext L calf is tender/ indurated w some patchy purplish skin lesions on lat/ post calf, very tender, edema down today and erythema improved Neuro is alert, Ox 3 , nf RUA AVF good bruit, hasn't been used  yet IJ cath working well  CXR 6/29 - bilat CHF w R effusion  Dialysis: AF TTS 78kg 3K/2.25 bath P4 Hep 2600 IJ cath (maturing RUA AVF) 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 The goal uncorr Ca for him will be 7.0- 8.0 (current Ca is 9.9). Stopped coumadin (now on apixaban). Consider deep skin biopsy  Plan - as above, HD today 2.0 Ca bath, max UF   Kelly Splinter MD [place] pager [phone]    cell [phone] 04/25/2016, 10:15 AM    Recent Labs Lab 04/24/16 0141 04/24/16 0710 04/24/16 1353 04/25/16 0216  NA 128* 130*  --  134*  K 6.6* 6.7*  --  4.9  CL 95* 96*  --  94*  CO2 15* 19*  --  24  GLUCOSE 252* 113*  --  130*  BUN 74* 75*  --  41*  CREATININE 6.73* 7.02*  --  4.68*  CALCIUM 10.0 9.8  --  9.9  PHOS  --   --  4.0 5.3*    Recent Labs Lab 04/24/16 0710 04/25/16 0216  AST 25  --   ALT 20  --   ALKPHOS 145*  --   BILITOT 1.8*  --   PROT 7.2  --   ALBUMIN 3.0* 2.9*    Recent Labs Lab 04/24/16 0141 04/24/16 0710 04/25/16 0216  WBC 27.8* 25.6* 17.2*  NEUTROABS  --  22.1* 14.5*  HGB 10.7* 10.5* 9.8*  HCT 32.8* 32.6* 30.8*  MCV 98.5 100.0 101.3*  PLT 188 175 164   Iron/TIBC/Ferritin/ %Sat    Component Value Date/Time   IRON 56 01/16/2015 1241   IRON 25* 07/18/2014 1259   TIBC 316 01/16/2015 1241   TIBC 274 07/18/2014 1259   FERRITIN 279 01/16/2015 1241   FERRITIN 398* 07/18/2014 1259    IRONPCTSAT 18* 01/16/2015 1241   IRONPCTSAT 9* 07/18/2014 1259

## 2016-04-26 DIAGNOSIS — R0902 Hypoxemia: Secondary | ICD-10-CM

## 2016-04-26 LAB — CBC WITH DIFFERENTIAL/PLATELET
BASOS ABS: 0 10*3/uL (ref 0.0–0.1)
Basophils Relative: 0 %
EOS PCT: 0 %
Eosinophils Absolute: 0.1 10*3/uL (ref 0.0–0.7)
HEMATOCRIT: 31.6 % — AB (ref 39.0–52.0)
Hemoglobin: 10.1 g/dL — ABNORMAL LOW (ref 13.0–17.0)
LYMPHS ABS: 0.5 10*3/uL — AB (ref 0.7–4.0)
LYMPHS PCT: 4 %
MCH: 32 pg (ref 26.0–34.0)
MCHC: 32 g/dL (ref 30.0–36.0)
MCV: 100 fL (ref 78.0–100.0)
Monocytes Absolute: 1.6 10*3/uL — ABNORMAL HIGH (ref 0.1–1.0)
Monocytes Relative: 12 %
NEUTROS ABS: 11.3 10*3/uL — AB (ref 1.7–7.7)
Neutrophils Relative %: 84 %
PLATELETS: 146 10*3/uL — AB (ref 150–400)
RBC: 3.16 MIL/uL — AB (ref 4.22–5.81)
RDW: 17.5 % — ABNORMAL HIGH (ref 11.5–15.5)
WBC: 13.5 10*3/uL — AB (ref 4.0–10.5)

## 2016-04-26 LAB — RENAL FUNCTION PANEL
ANION GAP: 14 (ref 5–15)
Albumin: 2.7 g/dL — ABNORMAL LOW (ref 3.5–5.0)
BUN: 37 mg/dL — AB (ref 6–20)
CHLORIDE: 95 mmol/L — AB (ref 101–111)
CO2: 24 mmol/L (ref 22–32)
Calcium: 9.5 mg/dL (ref 8.9–10.3)
Creatinine, Ser: 4.45 mg/dL — ABNORMAL HIGH (ref 0.61–1.24)
GFR calc Af Amer: 15 mL/min — ABNORMAL LOW (ref >60)
GFR, EST NON AFRICAN AMERICAN: 13 mL/min — AB (ref >60)
GLUCOSE: 145 mg/dL — AB (ref 65–99)
PHOSPHORUS: 5.5 mg/dL — AB (ref 2.5–4.6)
POTASSIUM: 4.6 mmol/L (ref 3.5–5.1)
Sodium: 133 mmol/L — ABNORMAL LOW (ref 135–145)

## 2016-04-26 LAB — GLUCOSE, CAPILLARY
Glucose-Capillary: 137 mg/dL — ABNORMAL HIGH (ref 65–99)
Glucose-Capillary: 168 mg/dL — ABNORMAL HIGH (ref 65–99)
Glucose-Capillary: 196 mg/dL — ABNORMAL HIGH (ref 65–99)
Glucose-Capillary: 173 mg/dL — ABNORMAL HIGH (ref 65–99)

## 2016-04-26 MED ORDER — VANCOMYCIN HCL IN DEXTROSE 750-5 MG/150ML-% IV SOLN: 750.0000 mg | INTRAVENOUS | Status: DC

## 2016-04-26 MED ORDER — POLYETHYLENE GLYCOL 3350 17 G PO PACK
17.0000 g | PACK | Freq: Every day | ORAL | Status: DC
  Administered 2016-04-26 – 2016-05-02 (×7): 17 g via ORAL
  Filled 2016-04-26 (×7): qty 1

## 2016-04-26 MED ORDER — DEXTROSE 5 % IV SOLN
2.0000 g | INTRAVENOUS | Status: DC
  Filled 2016-04-26: qty 2

## 2016-04-26 MED ORDER — DEXTROSE 5 % IV SOLN
2.0000 g | Freq: Once | INTRAVENOUS | Status: AC
  Administered 2016-04-26: 2 g via INTRAVENOUS
  Filled 2016-04-26 (×2): qty 2

## 2016-04-26 MED ORDER — VANCOMYCIN HCL IN DEXTROSE 750-5 MG/150ML-% IV SOLN
750.0000 mg | Freq: Once | INTRAVENOUS | Status: AC
  Administered 2016-04-26: 750 mg via INTRAVENOUS
  Filled 2016-04-26 (×2): qty 150

## 2016-04-26 NOTE — Progress Notes (Signed)
Report called to Hennie Duos, RN on 6E

## 2016-04-26 NOTE — Progress Notes (Signed)
PROGRESS NOTE    Tony Freeman DOB: Jun 10, 1951 DOA: 04/24/2016 PCP: Tanya Nones, MD    Brief Narrative:  Tony Freeman is a 65 y.o. male with with history of chronic atrial fibrillation, ischemic cardiomyopathy status post ICD placement, CAD status post stenting, diabetes mellitus type 2, ESRD on hemodialysis on Tuesday Thursday since Saturday and chronic combined systolic and diastolic heart failure presents to the ER because of increasing pain in his left lower extremity. Patient states he has been having pain in his left lower extremity over the last 2-3 days. Denies any fever or chills. Denies any trauma. In the ER patient was found to be febrile with labs showing leukocytosis and patient was hypoxic requiring BiPAP. Chest x-ray was showing airspace opacity and right-sided pleural effusion which was worsening. Patient labs also show Hyprkalemia for which patient was given calcium gluconate and sodium bicarbonate and insulin D50. On call nephrologist Dr. Lorrene Reid was consulted by ER physician and patient is to get early dialysis. Patient states he is mildly short of breath and has some nonproductive cough otherwise denies any chest pain. On exam patient's both lower extremity edematous with mild tenderness in the left lower extremity extending from the left knee to the foot. There is ulceration on the posterior aspect of his left leg. No active discharge. 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   ED Course: Was started on antibiotics and was given insulin and calcium gluconate sodium bicarbonate and D50  Assessment & Plan:   Principal Problem:   Acute respiratory failure with hypoxia (HCC) Active Problems:   Acute on chronic combined systolic and diastolic heart failure (HCC)   DM (diabetes mellitus), type 2, uncontrolled, with  renal complications (HCC)   CAD (coronary artery disease)   Lower extremity edema   Atrial fibrillation (HCC)   HCAP (healthcare-associated pneumonia)   ESRD (end stage renal disease) on dialysis (HCC)   Leg pain   Hyperkalemia   Hypervolemia   Pleural effusion   Ascites   Leukocytosis   Hypoxia   Calciphylaxis  #1 acute respiratory failure with hypoxia secondary to volume overload/ acute on chronic systolic heart failure and probable healthcare associated pneumonia/ Patient currently in hemodialysis with clinical improvement. Patient status post hemodialysis yesterday with 3.5 L removed. Patient is - 5.194 L during this hospitalization. Cardiac enzymes which was cycled were mildly elevated. Patient with 2-D echo from 11/29/2015 with a EF of 15-20% with severe diffuse hypokinesis and moderate to severe pulmonary artery systolic pressure with a PA peak pressure of 65 mmHg. Patient was on BiPAP on admission currently on nasal cannula. Patient may require 4 days of hemodialysis per week per cardiology recommendations. Patient per nephrology will likely require daily hemodialysis for the next one week and then hemodialysis 4 times a week thereafter. Continue Coreg, Lipitor. Patient has been pancultured results pending. Urine Legionella antigen and urine pneumococcus antigen pending. Continue empiric IV vancomycin and IV cefepime. Nephrology and cardiology following.  #2 end-stage renal disease on hemodialysis Patient with hemodialysis. Patient with - 5.194 L during this hospitalization. Patient with some clinical improvement. Continue sodium thiosulfate for calciphylaxis. Per nephrology.  #3 hyperkalemia Resolved. Status post sodium bicarbonate, D50, insulin, calcium gluconate. Patient received hemodialysis daily during this hospitalization.  #4 probable healthcare associated pneumonia Per chest x-ray. Patient also noted to be hypoxic on admission requiring BiPAP with a  significant leukocytosis.  WBC trending down. Urine cultures pending. Blood cultures pending. Urine Legionella antigen and urine pneumococcus antigen pending. Continue oxygen, empiric IV vancomycin and IV cefepime. Follow.  #5 acute on chronic systolic heart failure Cardiac enzymes mildly elevated which was felt to be secondary to CHF. Patient denies any chest pain. 2-D echo from 11/29/2015 with a EF of 15-20% with severe diffuse hypokinesis and moderate to severe pulmonary artery systolic pressure with a PA peak pressure of 65 mmHg. Patient is -5.194 L during this hospitalization. Patient received hemodialysis daily since admission. Patient has been seen in consult by cardiology who are recommending possible four-day hemodialysis per week to keep fluid off.  Per nephrology patient will likely require hemodialysis daily for at least one week and then HD 4 times daily per week. Continue Coreg, Lipitor, Plavix. Patient has been switched from Coumadin to apixaban per nephrology. Unable to use ACE inhibitor secondary to end-stage renal disease. Cardiology following and appreciate their input and recommendations.  #6 abdominal ascites S/p Diagnostic and therapeutic paracentesis with 6 L of fluid removed. Patient status post albumin IV. Patient with no abdominal pain. Gram stain negative. Fluid cultures pending. Fluid from paracentesis with 2003 WBCs and turbid in appearance. Patient with clinical improvement. Continue empiric IV antibiotics.  #7 paroxysmal atrial fibrillation Currently rate controlled. Continue Coreg and amiodarone. Coumadin has been changed to apixaban per nephrology. Continue Plavix. Cardiology following and appreciate input and recommendations.  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 Per nephrology patient will likely require daily dialysis for at least a week and then HD 4 times weekly thereafter. Pain  management.  #9 diabetes mellitus type 2 CBGs have ranged from 137-225. Check a hemoglobin A1c. Sliding scale insulin.  #10 coronary artery disease/elevated troponin Elevated troponin likely secondary to CHF. Patient denies any active chest pain. Continue Coreg, Lipitor, Plavix, amiodarone. Coumadin has been changed to apixaban per nephrology. Cardiology following and appreciate input and recommendations.  #11 leukocytosis Maybe secondary to probable healthcare associated pneumonia. Patient has been pancultured results pending. Urinalysis pending. WBC trending down. Patient status post paracentesis 04/25/2016 with 6 L of fluid removed. Gram stain negative. Fluid cultures pending. Cytology pending. Continue empiric IV vancomycin and IV cefepime.  #12 right pleural effusion Patient with chronic right pleural effusion. Repeat chest x-ray 04/25/2016 with moderate partially loculated right pleural effusion, atelectasis or infiltrate in the right lower lobe. No pulmonary edema. Small left pleural effusion with left basilar atelectasis. Continue empiric IV antibiotics. Pulmonary toilet. Repeat chest x-ray. Follow.  #13 nodular contour of liver Per CT chest. Patient also noted to have ascites status post paracentesis with 6 L of fluid removed. Patient with no significant alcoholic history. Patient with no history of IV drug use. Patient however does have a systolic heart failure EF of 15-20% and liver abnormalities could be secondary to right heart failure. Will check an acute hepatitis panel. Follow.   DVT prophylaxis: apixaban Code Status: Full Family Communication: Updated patient. No family at bedside. Disposition Plan: Transfer to telemetry.    Consultants:   Nephrology: Dr. Lorrene Reid 04/24/2016  Cardiology: Dr. Aundra Dubin 04/24/2016  Procedures:   Lower extremity Dopplers 04/24/2016  Abdominal ultrasound 04/24/2016  Plain films of the left tib-fib 04/24/2016  Chest x-ray 04/24/2016,  04/25/2016  CT chest 04/24/2016  Ultrasound guided Paracentesis with 6 L of tan chylous colored fluid removed 04/25/2016  Antimicrobials:   IV cefepime 04/24/2016  IV  vancomycin 04/24/2016   Subjective: Patient denies any chest pain. Improvement with shortness of breath. Patient still with complaints of left lower extremity pain and penis pain. Patient states significant improvement with abdominal distention after paracentesis and increased ability to breathe better.  Objective: Filed Vitals:   04/25/16 2354 04/26/16 0400 04/26/16 0506 04/26/16 0842  BP: 119/58 120/62 120/62 127/60  Pulse: 73 76 73 78  Temp: 98.4 F (36.9 C)  98.7 F (37.1 C) 98.3 F (36.8 C)  TempSrc: Oral  Oral Oral  Resp: 11 16 17 16   Height:      Weight:    72.077 kg (158 lb 14.4 oz)  SpO2: 93% 89% 92% 95%    Intake/Output Summary (Last 24 hours) at 04/26/16 1054 Last data filed at 04/25/16 2145  Gross per 24 hour  Intake    103 ml  Output   3500 ml  Net  -3397 ml   Filed Weights   04/25/16 0725 04/25/16 1100 04/26/16 0842  Weight: 79.5 kg (175 lb 4.3 oz) 76.2 kg (167 lb 15.9 oz) 72.077 kg (158 lb 14.4 oz)    Examination:  General exam: Appears calm and comfortable.  Respiratory system: Decreased breath sounds in the bases.  Respiratory effort normal. Cardiovascular system: S1 & S2 heard, RRR. No JVD, murmurs, rubs, gallops or clicks. 2 + BLE edema. Gastrointestinal system: Abdomen is Less distended, soft and nontender. No organomegaly or masses felt. Normal bowel sounds heard. Central nervous system: Alert and oriented. No focal neurological deficits. Extremities: Symmetric 5 x 5 power. Skin: LLE with ulceration on posterior aspect. Psychiatry: Judgement and insight appear normal. Mood & affect appropriate.     Data Reviewed: I have personally reviewed following labs and imaging studies  CBC:  Recent Labs Lab 04/24/16 0141 04/24/16 0710 04/25/16 0216 04/26/16 0527  WBC 27.8*  25.6* 17.2* 13.5*  NEUTROABS  --  22.1* 14.5* 11.3*  HGB 10.7* 10.5* 9.8* 10.1*  HCT 32.8* 32.6* 30.8* 31.6*  MCV 98.5 100.0 101.3* 100.0  PLT 188 175 164 [id number]*   Basic Metabolic Panel:  Recent Labs Lab 04/24/16 0141 04/24/16 0710 04/24/16 1353 04/25/16 0216 04/26/16 0527  NA 128* 130*  --  134* 133*  K 6.6* 6.7*  --  4.9 4.6  CL 95* 96*  --  94* 95*  CO2 15* 19*  --  24 24  GLUCOSE 252* 113*  --  130* 145*  BUN 74* 75*  --  41* 37*  CREATININE 6.73* 7.02*  --  4.68* 4.45*  CALCIUM 10.0 9.8  --  9.9 9.5  MG  --   --   --  2.1  --   PHOS  --   --  4.0 5.3* 5.5*   GFR: Estimated Creatinine Clearance: 15.3 mL/min (by C-G formula based on Cr of 4.45). Liver Function Tests:  Recent Labs Lab 04/24/16 0710 04/25/16 0216 04/25/16 1000 04/26/16 0527  AST 25  --  26  --   ALT 20  --  19  --   ALKPHOS 145*  --  137*  --   BILITOT 1.8*  --  1.7*  --   PROT 7.2  --  7.6  --   ALBUMIN 3.0* 2.9* 2.9* 2.7*   No results for input(s): LIPASE, AMYLASE in the last 168 hours. No results for input(s): AMMONIA in the last 168 hours. Coagulation Profile:  Recent Labs Lab 04/24/16 0710  INR 1.88*   Cardiac Enzymes:  Recent Labs Lab 04/24/16 0141 04/24/16  0710 04/24/16 1353 04/24/16 1822  TROPONINI 0.28* 0.34* 0.39* 0.40*   BNP (last 3 results) No results for input(s): PROBNP in the last 8760 hours. HbA1C: No results for input(s): HGBA1C in the last 72 hours. CBG:  Recent Labs Lab 04/24/16 2142 04/25/16 1214 04/25/16 1718 04/25/16 2127 04/26/16 0901  GLUCAP 175* 84 137* 225* 137*   Lipid Profile: No results for input(s): CHOL, HDL, LDLCALC, TRIG, CHOLHDL, LDLDIRECT in the last 72 hours. Thyroid Function Tests: No results for input(s): TSH, T4TOTAL, FREET4, T3FREE, THYROIDAB in the last 72 hours. Anemia Panel: No results for input(s): VITAMINB12, FOLATE, FERRITIN, TIBC, IRON, RETICCTPCT in the last 72 hours. Sepsis Labs:  Recent Labs Lab 04/24/16 0154  04/24/16 0710 04/24/16 1353  PROCALCITON  --  5.81  --   LATICACIDVEN 2.86* 1.4 1.3    Recent Results (from the past 240 hour(s))  Culture, blood (Routine X 2) w Reflex to ID Panel     Status: None (Preliminary result)   Collection Time: 04/24/16  4:20 AM  Result Value Ref Range Status   Specimen Description BLOOD LEFT ARM  Final   Special Requests AEROBIC BOTTLE ONLY 5ML  Final   Culture NO GROWTH 1 DAY  Final   Report Status PENDING  Incomplete  Culture, blood (Routine X 2) w Reflex to ID Panel     Status: None (Preliminary result)   Collection Time: 04/24/16  4:25 AM  Result Value Ref Range Status   Specimen Description BLOOD LEFT HAND  Final   Special Requests AEROBIC BOTTLE ONLY 5ML  Final   Culture NO GROWTH 1 DAY  Final   Report Status PENDING  Incomplete  MRSA PCR Screening     Status: None   Collection Time: 04/24/16  5:05 AM  Result Value Ref Range Status   MRSA by PCR NEGATIVE NEGATIVE Final    Comment:        The GeneXpert MRSA Assay (FDA approved for NASAL specimens only), is one component of a comprehensive MRSA colonization surveillance program. It is not intended to diagnose MRSA infection nor to guide or monitor treatment for MRSA infections.   Gram stain     Status: None   Collection Time: 04/25/16  4:24 PM  Result Value Ref Range Status   Specimen Description PERITONEAL FLUID  Final   Special Requests NONE  Final   Gram Stain   Final    RARE WBC PRESENT, PREDOMINANTLY MONONUCLEAR NO ORGANISMS SEEN    Report Status 04/25/2016 FINAL  Final         Radiology Studies: Ct Chest Wo Contrast  04/24/2016  CLINICAL DATA:  Further evaluate pleural effusion. Cough. History of MI in 2016. History of hypertension. EXAM: CT CHEST WITHOUT CONTRAST TECHNIQUE: Multidetector CT imaging of the chest was performed following the standard protocol without IV contrast. COMPARISON:  04/24/2016 FINDINGS: Cardiovascular: Extensive coronary artery calcifications are  present. Heart is mildly enlarged. No pericardial effusion. Left-sided AICD leads extend into the right atrium and right ventricle. A a right-sided dialysis catheter tip extends to the level of the SVC right atrial junction. Mediastinum/Nodes: The visualized portion of the thyroid gland has a normal appearance. Small mediastinal lymph nodes are present. Largest is identified in the region the precarinal station, measuring approximate 1.0 cm. Lungs/Pleura: There are bilateral pleural effusions, right greater than left. Bibasilar atelectasis is present. Parenchymal calcifications are identified within the lung bases, right greater than left likely representing calcified granulomata. No suspicious pulmonary nodules are identified. There  is smooth septal thickening in the lungs, consistent with mild pulmonary edema. Upper Abdomen: Nodular contour of the liver raising the question of cirrhosis. Calcified granulomata are identified within the liver. There is dense atherosclerotic calcification of the abdominal aorta and its branches. Ascites. Calcified gallstone or gallstones. Musculoskeletal: Mild mid thoracic spondylosis. No suspicious lytic or blastic lesions are identified. IMPRESSION: 1. Small to moderate right pleural effusion. 2. Small left pleural effusion. 3. Bibasilar atelectasis. 4. Coronary artery disease and mild cardiomegaly. 5. Catheters and pacemaker as described. 6. Abdominal ascites and probable cirrhosis. 7. Cholelithiasis. Electronically Signed   By: Nolon Nations M.D.   On: 04/24/2016 15:17   US Abdomen Limited  04/24/2016  CLINICAL DATA:  Abdominal distention for 1 year. Evaluate for ascites. EXAM: LIMITED ABDOMEN ULTRASOUND FOR ASCITES TECHNIQUE: Limited ultrasound survey for ascites was performed in all four abdominal quadrants. COMPARISON:  11/21/2015 CT FINDINGS: Ultrasound is performed of the 4 quadrants, demonstrating moderate ascites throughout the abdomen and pelvis. IMPRESSION:  Moderate ascites. Electronically Signed   By: Nolon Nations M.D.   On: 04/24/2016 15:29   US Paracentesis  04/26/2016  INDICATION: 65 year old with a history of congestive heart failure as well as end-stage renal disease on hemodialysis. He has developed abdominal ascites and a request is made for diagnostic and therapeutic paracentesis. EXAM: ULTRASOUND GUIDED DIAGNOSTIC AND THERAPEUTIC PARACENTESIS MEDICATIONS: 1% lidocaine COMPLICATIONS: None immediate. PROCEDURE: Informed written consent was obtained from the patient after a discussion of the risks, benefits and alternatives to treatment. A timeout was performed prior to the initiation of the procedure. Initial ultrasound scanning demonstrates a large amount of ascites within the right upper abdominal quadrant. The right upper abdomen was prepped and draped in the usual sterile fashion. 1% lidocaine was used for local anesthesia. Following this, a 19 gauge, 7-cm, Yueh catheter was introduced. An ultrasound image was saved for documentation purposes. The paracentesis was performed. The catheter was removed and a dressing was applied. The patient tolerated the procedure well without immediate post procedural complication. FINDINGS: A total of approximately 6 L of tan chylous colored fluid was removed. Samples were sent to the laboratory as requested by the clinical team. IMPRESSION: Successful ultrasound-guided paracentesis yielding 6 liters of peritoneal fluid. We stopped at 6 L as this is a first time paracentesis maximum. Read by: Saverio Danker, PA-C Electronically Signed   By: Lucrezia Europe M.D.   On: 04/25/2016 16:46   Dg Chest Port 1 View  04/25/2016  CLINICAL DATA:  Cough, shortness of Breath EXAM: PORTABLE CHEST 1 VIEW COMPARISON:  04/24/2016 FINDINGS: There is moderate partially loculated right pleural effusion. Atelectasis or infiltrate right lower lobe. No pulmonary edema. Dual lead cardiac pacemaker with leads in right atrium and right ventricle.  Right IJ dialysis catheter with tip in distal SVC. Small left pleural effusion with left basilar atelectasis. IMPRESSION: Moderate partial loculated right pleural effusion, atelectasis or infiltrate in right lower lobe. No pulmonary edema. Small left pleural effusion with left basilar atelectasis. Electronically Signed   By: Lahoma Crocker M.D.   On: 04/25/2016 14:41        Scheduled Meds: . amiodarone  100 mg Oral Daily  . apixaban  5 mg Oral BID  . atorvastatin  40 mg Oral q1800  . carvedilol  3.125 mg Oral BID WC  . ceFEPime (MAXIPIME) IV  2 g Intravenous Q T,Th,Sa-HD  . clobetasol ointment  1 application Topical BID  . clopidogrel  75 mg Oral Daily  . gabapentin  300 mg Oral QHS  . heparin  2,600 Units Dialysis Once in dialysis  . insulin aspart  0-9 Units Subcutaneous TID WC  . multivitamin with minerals  1 tablet Oral Daily  . polyethylene glycol  17 g Oral Daily  . silver sulfADIAZINE  1 application Topical BID  . sodium chloride flush  3 mL Intravenous Q12H  . sodium thiosulfate infusion for calciphylaxis  25 g Intravenous Q T,Th,Sa-HD  . vancomycin  1,000 mg Intravenous Q T,Th,Sa-HD   Continuous Infusions:    LOS: 2 days    Time spent: 40 mins    Alicya Bena, MD [facility] Pager [phone]  If 7PM-7AM, please contact night-coverage [email or web address] Password [email or web address] 04/26/2016, 10:54 AM

## 2016-04-26 NOTE — Progress Notes (Signed)
Pharmacy Antibiotic Note  Tony Freeman is a 65 y.o. male with ESRD admitted on 04/24/2016 with LLE pain and SOB. Pharmacy has been consulted for Vancomycin + Cefepime dosing for r/o PNA, cellulitis vs calciphylaxis.   The patient is noted to be ESRD-MWF however given possible calciphylaxis - the patient received an extra HD session on 6/30 off-schedule (3.5 hr BFR 400). MD wanted to also dialyze today but patient refused. Antibiotic doses were missed after HD on 6/30 - will make up for today.   Plan: 1. Cefepime 2g IV x 1 dose today to make up for HD session on 6/30 2. Vancomycin 750 mg IV x 1 dose today to make up for HD session on 6/30 3. Resume Cefepime 2g on T/Th/Sat @ 1800 starting on 7/4 4. Resume Vancomycin 750 mg post HD-TTS starting on 7/4 5. Will continue to follow HD schedule/duration, culture results, LOT, and antibiotic de-escalation plans   Height: 5\' 6"  (167.6 cm) Weight: 158 lb 14.4 oz (72.077 kg) (took three times per patient request/ RN present) IBW/kg (Calculated) : 63.8  Temp (24hrs), Avg:98.4 F (36.9 C), Min:98.2 F (36.8 C), Max:98.8 F (37.1 C)   Recent Labs Lab 04/24/16 0141 04/24/16 0154 04/24/16 0710 04/24/16 1353 04/25/16 0216 04/26/16 0527  WBC 27.8*  --  25.6*  --  17.2* 13.5*  CREATININE 6.73*  --  7.02*  --  4.68* 4.45*  LATICACIDVEN  --  2.86* 1.4 1.3  --   --     Estimated Creatinine Clearance: 15.3 mL/min (by C-G formula based on Cr of 4.45).    No Known Allergies  Antimicrobials this admission: Vanc 6/29 >> Cefepime 6/29 >>  Dose adjustments this admission:   Microbiology results: 6/29 BCx >> ntd 6/29 UCx >> ntd 6/29 MRSA PCR >> neg 6/30 Peritoneal fluid >>  Thank you for allowing pharmacy to be a part of this patient's care.  Alycia Rossetti, PharmD, BCPS Clinical Pharmacist Pager: [phone] 04/26/2016 2:24 PM

## 2016-04-26 NOTE — Progress Notes (Signed)
Subjective:  Major complaint is leg pain, denies shortness of breath   Objective:  Vital Signs in the last 24 hours: BP 120/44 mmHg  Pulse 64  Temp(Src) 98.3 F (36.8 C) (Oral)  Resp 13  Ht 5\' 6"  (1.676 m)  Wt 72.077 kg (158 lb 14.4 oz)  BMI 25.66 kg/m2  SpO2 95%  Physical Exam: Thin black male in no acute distress Lungs: Mild Crackles right base Cardiac:  Regular rhythm, normal S1 and S2, no S3, 1/6 systolic murmur, and JVP mildly elevated Abdomen:  Mildly distended Extremities:  Skin necrosis of the right and left lower extremity  Intake/Output from previous day: 06/30 0701 - 07/01 0700 In: 103 [I.V.:3; IV Piggyback:100] Out: 3500  Weight Filed Weights   04/25/16 0725 04/25/16 1100 04/26/16 0842  Weight: 79.5 kg (175 lb 4.3 oz) 76.2 kg (167 lb 15.9 oz) 72.077 kg (158 lb 14.4 oz)    Lab Results: Basic Metabolic Panel:  Recent Labs  04/25/16 0216 04/26/16 0527  NA 134* 133*  K 4.9 4.6  CL 94* 95*  CO2 24 24  GLUCOSE 130* 145*  BUN 41* 37*  CREATININE 4.68* 4.45*    CBC:  Recent Labs  04/25/16 0216 04/26/16 0527  WBC 17.2* 13.5*  NEUTROABS 14.5* 11.3*  HGB 9.8* 10.1*  HCT 30.8* 31.6*  MCV 101.3* 100.0  PLT 164 146*    BNP    Component Value Date/Time   BNP >4500.0* 04/24/2016 0501    Cardiac Panel (last 3 results)  Recent Labs  04/24/16 0710 04/24/16 1353 04/24/16 1822  TROPONINI 0.34* 0.39* 0.40*    PROTIME: Lab Results  Component Value Date   INR 1.88* 04/24/2016   INR 1.8 04/14/2016   INR 5.3 04/07/2016    Telemetry: Sinus rhythm with PACs  Assessment/Plan:  1.  Acute on chronic systolic congestive heart failure-volume is managed with hemodialysis 2.  Paroxysmal atrial fibrillation currently maintaining sinus rhythm on amiodarone 3.  CAD with minimal elevation of troponin 4.  End-stage renal disease  Recommendations:  Continue amiodarone.  Watch rhythm and volume per nephrology.     Kerry Hough  MD  [facility]  04/26/2016, 12:27 PM

## 2016-04-26 NOTE — Progress Notes (Signed)
[facility] Progress Note   Subjective: L calf pain severe, no other c/o, had 6L tap of ascites yesterday  Filed Vitals:   04/25/16 2354 04/26/16 0400 04/26/16 0506 04/26/16 0842  BP: 119/58 120/62 120/62 127/60  Pulse: 73 76 73 78  Temp: 98.4 F (36.9 C)  98.7 F (37.1 C) 98.3 F (36.8 C)  TempSrc: Oral  Oral Oral  Resp: 11 16 17 16   Height:      Weight:    72.077 kg (158 lb 14.4 oz)  SpO2: 93% 89% 92% 95%    Inpatient medications: . amiodarone  100 mg Oral Daily  . apixaban  5 mg Oral BID  . atorvastatin  40 mg Oral q1800  . carvedilol  3.125 mg Oral BID WC  . ceFEPime (MAXIPIME) IV  2 g Intravenous Q T,Th,Sa-HD  . clobetasol ointment  1 application Topical BID  . clopidogrel  75 mg Oral Daily  . gabapentin  300 mg Oral QHS  . heparin  2,600 Units Dialysis Once in dialysis  . insulin aspart  0-9 Units Subcutaneous TID WC  . multivitamin with minerals  1 tablet Oral Daily  . polyethylene glycol  17 g Oral Daily  . silver sulfADIAZINE  1 application Topical BID  . sodium chloride flush  3 mL Intravenous Q12H  . sodium thiosulfate infusion for calciphylaxis  25 g Intravenous Q T,Th,Sa-HD  . vancomycin  1,000 mg Intravenous Q T,Th,Sa-HD     sodium chloride, sodium chloride, acetaminophen **OR** acetaminophen, albuterol, alteplase, camphor-menthol **AND** hydrOXYzine, docusate sodium, docusate sodium, feeding supplement (NEPRO CARB STEADY), heparin, lidocaine (PF), lidocaine-prilocaine, mometasone-formoterol, nitroGLYCERIN, ondansetron **OR** ondansetron (ZOFRAN) IV, oxyCODONE-acetaminophen, pentafluoroprop-tetrafluoroeth, sorbitol, traMADol, zolpidem  Exam: Alert no distress, no jvd Chest clear bilat Irreg irreg no mrg Abd distended, nontender +bs GU glans has patchy skin discoloration, no actual gangrene Ext L calf indurated/swollen w some patchy purplish skin lesions on lat/ post calf, very tender Neuro is alert, Ox 3 , nf RUA AVF good bruit, hasn't  been used yet IJ cath working well  CXR 6/29 - bilat CHF w R effusion  Dialysis: AF TTS 78kg 3K/2.25 bath P4 Hep 2600 IJ cath (maturing RUA AVF) 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  Empiric Rx at this time, consider skin biopsy during the week.  Aggressive HD is recommended , however patient unwilling to have HD again today (would be 3rd in a row). If calciphylaxis is causing his leg pain, the goal is to get as much Ca out of the body as possible using low Ca bath and avoiding all Ca/ vit D products. The goal uncorr Ca for him will be 7.0- 8.0 (current Ca 10 on admit, down to 9.5 today). Also use Na thiosulfate as antioxidant w HD tiw.  Stopped coumadin, now on apixaban.  Stopping coumadin is recommended for pt's w calciphylaxis if possible.   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  Very serious situation and patient has low health literacy and I don't think understands the gravity of his situation.  Also I'm not sure he will be able to tolerate aggressive HD that is required for #9.  I spoke w pt's son and painted a not so optimistic picture, told him we will do what we can and hope for the best.    Plan - as above   Kelly Splinter MD [facility] pager [phone]    cell [phone] 04/26/2016, 11:46 AM    Recent Labs Lab 04/24/16 0710 04/24/16 1353 04/25/16 0216 04/26/16 0527  NA 130*  --  134* 133*   K 6.7*  --  4.9 4.6  CL 96*  --  94* 95*  CO2 19*  --  24 24  GLUCOSE 113*  --  130* 145*  BUN 75*  --  41* 37*  CREATININE 7.02*  --  4.68* 4.45*  CALCIUM 9.8  --  9.9 9.5  PHOS  --  4.0 5.3* 5.5*    Recent Labs Lab 04/24/16 0710 04/25/16 0216 04/25/16 1000 04/26/16 0527  AST 25  --  26  --   ALT 20  --  19  --   ALKPHOS 145*  --  137*  --   BILITOT 1.8*  --  1.7*  --   PROT 7.2  --  7.6  --   ALBUMIN 3.0* 2.9* 2.9* 2.7*    Recent Labs Lab 04/24/16 0710 04/25/16 0216 04/26/16 0527  WBC 25.6* 17.2* 13.5*  NEUTROABS 22.1* 14.5* 11.3*  HGB 10.5* 9.8* 10.1*  HCT 32.6* 30.8* 31.6*  MCV 100.0 101.3* 100.0  PLT 175 164 146*   Iron/TIBC/Ferritin/ %Sat    Component Value Date/Time   IRON 56 01/16/2015 1241   IRON 25* 07/18/2014 1259   TIBC 316 01/16/2015 1241   TIBC 274 07/18/2014 1259   FERRITIN 279 01/16/2015 1241   FERRITIN 398* 07/18/2014 1259   IRONPCTSAT 18* 01/16/2015 1241   IRONPCTSAT 9* 07/18/2014 1259

## 2016-04-27 ENCOUNTER — Inpatient Hospital Stay (HOSPITAL_COMMUNITY): Payer: Medicare HMO

## 2016-04-27 DIAGNOSIS — L8990 Pressure ulcer of unspecified site, unspecified stage: Secondary | ICD-10-CM | POA: Insufficient documentation

## 2016-04-27 DIAGNOSIS — M79609 Pain in unspecified limb: Secondary | ICD-10-CM

## 2016-04-27 LAB — RENAL FUNCTION PANEL
Albumin: 2.5 g/dL — ABNORMAL LOW (ref 3.5–5.0)
Anion gap: 17 — ABNORMAL HIGH (ref 5–15)
BUN: 59 mg/dL — ABNORMAL HIGH (ref 6–20)
CALCIUM: 9.3 mg/dL (ref 8.9–10.3)
CO2: 22 mmol/L (ref 22–32)
CREATININE: 6.2 mg/dL — AB (ref 0.61–1.24)
Chloride: 90 mmol/L — ABNORMAL LOW (ref 101–111)
GFR, EST AFRICAN AMERICAN: 10 mL/min — AB (ref >60)
GFR, EST NON AFRICAN AMERICAN: 9 mL/min — AB (ref >60)
Glucose, Bld: 145 mg/dL — ABNORMAL HIGH (ref 65–99)
Phosphorus: 6.6 mg/dL — ABNORMAL HIGH (ref 2.5–4.6)
Potassium: 5.4 mmol/L — ABNORMAL HIGH (ref 3.5–5.1)
SODIUM: 129 mmol/L — AB (ref 135–145)

## 2016-04-27 LAB — CBC WITH DIFFERENTIAL/PLATELET
BASOS PCT: 0 %
Basophils Absolute: 0 10*3/uL (ref 0.0–0.1)
EOS ABS: 0.1 10*3/uL (ref 0.0–0.7)
EOS PCT: 1 %
HCT: 31.6 % — ABNORMAL LOW (ref 39.0–52.0)
HEMOGLOBIN: 10.2 g/dL — AB (ref 13.0–17.0)
Lymphocytes Relative: 6 %
Lymphs Abs: 0.7 10*3/uL (ref 0.7–4.0)
MCH: 32 pg (ref 26.0–34.0)
MCHC: 32.3 g/dL (ref 30.0–36.0)
MCV: 99.1 fL (ref 78.0–100.0)
Monocytes Absolute: 1.5 10*3/uL — ABNORMAL HIGH (ref 0.1–1.0)
Monocytes Relative: 12 %
NEUTROS PCT: 81 %
Neutro Abs: 10.6 10*3/uL — ABNORMAL HIGH (ref 1.7–7.7)
PLATELETS: 154 10*3/uL (ref 150–400)
RBC: 3.19 MIL/uL — ABNORMAL LOW (ref 4.22–5.81)
RDW: 16.7 % — ABNORMAL HIGH (ref 11.5–15.5)
WBC: 12.9 10*3/uL — AB (ref 4.0–10.5)

## 2016-04-27 LAB — GLUCOSE, CAPILLARY
GLUCOSE-CAPILLARY: 158 mg/dL — AB (ref 65–99)
GLUCOSE-CAPILLARY: 167 mg/dL — AB (ref 65–99)
GLUCOSE-CAPILLARY: 194 mg/dL — AB (ref 65–99)
GLUCOSE-CAPILLARY: 155 mg/dL — AB (ref 65–99)

## 2016-04-27 LAB — HEPATIC FUNCTION PANEL
ALBUMIN: 2.5 g/dL — AB (ref 3.5–5.0)
ALK PHOS: 151 U/L — AB (ref 38–126)
ALT: 17 U/L (ref 17–63)
AST: 23 U/L (ref 15–41)
Bilirubin, Direct: 0.7 mg/dL — ABNORMAL HIGH (ref 0.1–0.5)
Indirect Bilirubin: 0.6 mg/dL (ref 0.3–0.9)
TOTAL PROTEIN: 6.6 g/dL (ref 6.5–8.1)
Total Bilirubin: 1.3 mg/dL — ABNORMAL HIGH (ref 0.3–1.2)

## 2016-04-27 NOTE — Progress Notes (Signed)
VASCULAR LAB PRELIMINARY  ARTERIAL  ABI completed:    RIGHT    LEFT    PRESSURE WAVEFORM  PRESSURE WAVEFORM  BRACHIAL HD access  BRACHIAL 191 Triphasic   DP calcified Triphasic   DP Not taken 2/2 pain Monophasic   AT   AT    PT absent  PT absent   PER   PER    GREAT TOE  NA GREAT TOE  NA    RIGHT LEFT  ABI calcified Not taken due to pain     Tony Freeman, RVT 04/27/2016, 3:15 PM

## 2016-04-27 NOTE — Progress Notes (Signed)
Subjective:  Still complains of painful legs but no complaint of shortness of breath or chest pain.  Objective:  Vital Signs in the last 24 hours: BP 135/38 mmHg  Pulse 55  Temp(Src) 98.7 F (37.1 C) (Oral)  Resp 18  Ht 5\' 6"  (1.676 m)  Wt 76.9 kg (169 lb 8.5 oz)  BMI 27.38 kg/m2  SpO2 94%  Physical Exam: Thin black male in no acute distress Lungs: Mild Crackles right base Cardiac:  Regular rhythm, normal S1 and S2, no S3, 1/6 systolic murmur, and JVP mildly elevated Extremities:  Skin necrosis of the right and left lower extremity  Intake/Output from previous day: 07/01 0701 - 07/02 0700 In: 420 [P.O.:420] Out: 0  Weight Filed Weights   04/25/16 1100 04/26/16 0842 04/26/16 2141  Weight: 76.2 kg (167 lb 15.9 oz) 72.077 kg (158 lb 14.4 oz) 76.9 kg (169 lb 8.5 oz)    Lab Results: Basic Metabolic Panel:  Recent Labs  04/26/16 0527 04/27/16 0623  NA 133* 129*  K 4.6 5.4*  CL 95* 90*  CO2 24 22  GLUCOSE 145* 145*  BUN 37* 59*  CREATININE 4.45* 6.20*    CBC:  Recent Labs  04/26/16 0527 04/27/16 0623  WBC 13.5* 12.9*  NEUTROABS 11.3* 10.6*  HGB 10.1* 10.2*  HCT 31.6* 31.6*  MCV 100.0 99.1  PLT 146* 154    BNP    Component Value Date/Time   BNP >4500.0* 04/24/2016 0501    Cardiac Panel (last 3 results)  Recent Labs  04/24/16 1353 04/24/16 1822  TROPONINI 0.39* 0.40*    PROTIME: Lab Results  Component Value Date   INR 1.88* 04/24/2016   INR 1.8 04/14/2016   INR 5.3 04/07/2016    Telemetry: Sinus rhythm with PACs  Assessment/Plan:  1.  Acute on chronic systolic congestive heart failure-volume is managed with hemodialysis 2.  Paroxysmal atrial fibrillation currently maintaining sinus rhythm on amiodarone 3.  CAD with minimal elevation of troponin 4.  End-stage renal disease  Recommendations:  Continue amiodarone.  Continue control volume with dialysis.      Kerry Hough  MD [facility]  04/27/2016, 1:17 PM

## 2016-04-27 NOTE — Progress Notes (Signed)
PROGRESS NOTE    Tony Freeman  [id number] DOB: January 20, 1951 DOA: 04/24/2016 PCP: Tanya Nones, MD    Brief Narrative:  Tony Freeman is a 65 y.o. male with with history of chronic atrial fibrillation, ischemic cardiomyopathy status post ICD placement, CAD status post stenting, diabetes mellitus type 2, ESRD on hemodialysis on Tuesday Thursday since Saturday and chronic combined systolic and diastolic heart failure presents to the ER because of increasing pain in his left lower extremity. Patient states he has been having pain in his left lower extremity over the last 2-3 days. Denies any fever or chills. Denies any trauma. In the ER patient was found to be febrile with labs showing leukocytosis and patient was hypoxic requiring BiPAP. Chest x-ray was showing airspace opacity and right-sided pleural effusion which was worsening. Patient labs also show Hyprkalemia for which patient was given calcium gluconate and sodium bicarbonate and insulin D50. On call nephrologist Dr. Lorrene Reid was consulted by ER physician and patient is to get early dialysis. Patient states he is mildly short of breath and has some nonproductive cough otherwise denies any chest pain. On exam patient's both lower extremity edematous with mild tenderness in the left lower extremity extending from the left knee to the foot. There is ulceration on the posterior aspect of his left leg. No active discharge. 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   ED Course: Was started on antibiotics and was given insulin and calcium gluconate sodium bicarbonate and D50  Assessment & Plan:   Principal Problem:   Acute respiratory failure with hypoxia (HCC) Active Problems:   Acute on chronic combined systolic and diastolic heart failure (HCC)   DM (diabetes mellitus), type 2, uncontrolled, with  renal complications (HCC)   CAD (coronary artery disease)   Lower extremity edema   Atrial fibrillation (HCC)   HCAP (healthcare-associated pneumonia)   ESRD (end stage renal disease) on dialysis (HCC)   Leg pain   Hyperkalemia   Hypervolemia   Pleural effusion   Ascites   Leukocytosis   Hypoxia   Calciphylaxis   Pressure ulcer  #1 acute respiratory failure with hypoxia secondary to volume overload/ acute on chronic systolic heart failure and probable healthcare associated pneumonia/ Patient with clinical improvement. Patient status post hemodialysis 04/25/2016 with 3.5 L removed. Patient is - 4.654 L during this hospitalization. Cardiac enzymes which was cycled were mildly elevated. Patient with 2-D echo from 11/29/2015 with a EF of 15-20% with severe diffuse hypokinesis and moderate to severe pulmonary artery systolic pressure with a PA peak pressure of 65 mmHg. Patient was on BiPAP on admission currently on nasal cannula. Patient may require 4 days of hemodialysis per week per cardiology recommendations. Patient per nephrology will likely require daily hemodialysis for the next one week and then hemodialysis 4 times a week thereafter. Continue Coreg, Lipitor. Patient has been pancultured results pending. Urine Legionella antigen and urine pneumococcus antigen pending. Continue empiric IV vancomycin and IV cefepime. Nephrology and cardiology following.  #2 end-stage renal disease on hemodialysis Patient with hemodialysis. Patient with - 4.654 L during this hospitalization. Patient with some clinical improvement. Continue sodium thiosulfate for calciphylaxis. Per nephrology.  #3 hyperkalemia Improved since admission. Potassium currently at 5.4. Patient asymptomatic. Status post sodium bicarbonate, D50, insulin, calcium gluconate. Patient received hemodialysis with improvement with hyperkalemia.   #4 probable healthcare associated pneumonia Per chest x-ray. Patient also  noted to be hypoxic  on admission requiring BiPAP with a significant leukocytosis. WBC trending down. Urine cultures pending. Blood cultures pending. Urine Legionella antigen and urine pneumococcus antigen pending. Continue oxygen, empiric IV vancomycin and IV cefepime. Blood cultures remain negative tomorrow will discontinue IV vancomycin. Follow.  #5 acute on chronic systolic heart failure Cardiac enzymes mildly elevated which was felt to be secondary to CHF. Patient denies any chest pain. 2-D echo from 11/29/2015 with a EF of 15-20% with severe diffuse hypokinesis and moderate to severe pulmonary artery systolic pressure with a PA peak pressure of 65 mmHg. Patient is -4.654 L during this hospitalization. Patient received hemodialysis daily since admission and none yesterday. Patient has been seen in consult by cardiology who are recommending possible four-day hemodialysis per week to keep fluid off.  Per nephrology patient will likely require hemodialysis daily for at least one week and then HD 4 times daily per week. Continue Coreg, Lipitor, Plavix. Patient has been switched from Coumadin to apixaban per nephrology. Unable to use ACE inhibitor secondary to end-stage renal disease. Cardiology following and appreciate their input and recommendations.  #6 abdominal ascites S/p Diagnostic and therapeutic paracentesis with 6 L of fluid removed. Patient status post albumin IV. Patient with no abdominal pain. Gram stain negative. Fluid cultures pending. Fluid from paracentesis with 2003 WBCs and turbid in appearance. Patient with clinical improvement. Continue empiric IV antibiotics.  #7 paroxysmal atrial fibrillation Currently rate controlled. Continue Coreg and amiodarone. Coumadin has been changed to apixaban per nephrology due to patient's calciphylaxis. Continue Plavix. Cardiology following and appreciate input and recommendations.  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 Per nephrology patient will likely require daily dialysis for at least a week and then HD 4 times weekly thereafter. Pain management.  #9 diabetes mellitus type 2 CBGs have ranged from 158-173. Hemoglobin A1c pending. Sliding scale insulin.  #10 coronary artery disease/elevated troponin Elevated troponin likely secondary to CHF. Patient denies any active chest pain. Continue Coreg, Lipitor, Plavix, amiodarone. Coumadin has been changed to apixaban per nephrology secondary to calciphylaxis. Cardiology following and appreciate input and recommendations.  #11 leukocytosis Maybe secondary to probable healthcare associated pneumonia. Patient has been pancultured results pending. Urinalysis pending. WBC trending down. Patient status post paracentesis 04/25/2016 with 6 L of fluid removed. Gram stain negative. Fluid cultures pending. Cytology pending. Continue empiric IV vancomycin and IV cefepime.  #12 right pleural effusion Patient with chronic right pleural effusion. Repeat chest x-ray 04/25/2016 with moderate partially loculated right pleural effusion, atelectasis or infiltrate in the right lower lobe. No pulmonary edema. Small left pleural effusion with left basilar atelectasis. Repeat chest x-ray this morning with interval improvement in interstitial edema and right midlung infiltrate. Persistent pleural effusions and bibasilar atelectasis/consolidation. Case discussed with Dr. Chase Caller of critical care who reviewed the films and feels like pleural effusion likely secondary to patient's end-stage renal disease and chronic systolic heart failure ( EF 15-20%) and at this time to continue current management with hemodialysis for volume overload. Continue empiric IV antibiotics. Pulmonary toilet. Follow.  #13 nodular contour of liver Per CT chest. Patient also noted to have ascites status post paracentesis with 6 L of fluid removed. Patient with no  significant alcoholic history. Patient with no history of IV drug use. Patient however does have a systolic heart failure EF of 15-20% and liver abnormalities could be secondary to right heart failure. Acute hepatitis panel pending. Follow.  DVT prophylaxis: apixaban Code Status: Full Family Communication: Updated patient. No family at bedside. Disposition Plan: Home when medically stable.   Consultants:   Nephrology: Dr. Lorrene Reid 04/24/2016  Cardiology: Dr. Aundra Dubin 04/24/2016  Procedures:   Lower extremity Dopplers 04/24/2016  Abdominal ultrasound 04/24/2016  Plain films of the left tib-fib 04/24/2016  Chest x-ray 04/24/2016, 04/25/2016  CT chest 04/24/2016  Ultrasound guided Paracentesis with 6 L of tan chylous colored fluid removed 04/25/2016  Antimicrobials:   IV cefepime 04/24/2016  IV  vancomycin 04/24/2016   Subjective: Patient denies any chest pain. Improvement with shortness of breath. Patient with complaints of left lower extremity pain and penis pain. Patient states significant improvement with abdominal distention after paracentesis and increased ability to breathe better.  Objective: Filed Vitals:   04/26/16 2341 04/27/16 0300 04/27/16 0352 04/27/16 0809  BP: 98/60 128/95 116/37 135/38  Pulse: 57 65 56 55  Temp:   98.5 F (36.9 C) 98.7 F (37.1 C)  TempSrc:   Oral Oral  Resp:   17 18  Height:      Weight:      SpO2:  95% 93% 94%    Intake/Output Summary (Last 24 hours) at 04/27/16 1108 Last data filed at 04/27/16 1042  Gross per 24 hour  Intake    540 ml  Output      0 ml  Net    540 ml   Filed Weights   04/25/16 1100 04/26/16 0842 04/26/16 2141  Weight: 76.2 kg (167 lb 15.9 oz) 72.077 kg (158 lb 14.4 oz) 76.9 kg (169 lb 8.5 oz)    Examination:  General exam: Appears calm and comfortable.  Respiratory system: Decreased breath sounds in the bases.  Respiratory effort normal. Cardiovascular system: S1 & S2 heard, RRR. No JVD, murmurs,  rubs, gallops or clicks. 2 + BLE edema. Gastrointestinal system: Abdomen is Less distended, soft and nontender. No organomegaly or masses felt. Normal bowel sounds heard. Central nervous system: Alert and oriented. No focal neurological deficits. Extremities: Symmetric 5 x 5 power. Skin: LLE with ulceration/necrosis on posterior aspect. Blister noted on LLE. Psychiatry: Judgement and insight appear normal. Mood & affect appropriate.     Data Reviewed: I have personally reviewed following labs and imaging studies  CBC:  Recent Labs Lab 04/24/16 0141 04/24/16 0710 04/25/16 0216 04/26/16 0527 04/27/16 0623  WBC 27.8* 25.6* 17.2* 13.5* 12.9*  NEUTROABS  --  22.1* 14.5* 11.3* 10.6*  HGB 10.7* 10.5* 9.8* 10.1* 10.2*  HCT 32.8* 32.6* 30.8* 31.6* 31.6*  MCV 98.5 100.0 101.3* 100.0 99.1  PLT 188 175 164 146* [id number]   Basic Metabolic Panel:  Recent Labs Lab 04/24/16 0141 04/24/16 0710 04/24/16 1353 04/25/16 0216 04/26/16 0527 04/27/16 0623  NA 128* 130*  --  134* 133* 129*  K 6.6* 6.7*  --  4.9 4.6 5.4*  CL 95* 96*  --  94* 95* 90*  CO2 15* 19*  --  24 24 22   GLUCOSE 252* 113*  --  130* 145* 145*  BUN 74* 75*  --  41* 37* 59*  CREATININE 6.73* 7.02*  --  4.68* 4.45* 6.20*  CALCIUM 10.0 9.8  --  9.9 9.5 9.3  MG  --   --   --  2.1  --   --   PHOS  --   --  4.0 5.3* 5.5* 6.6*   GFR: Estimated Creatinine Clearance: 11.9 mL/min (by C-G formula based on Cr of 6.2). Liver Function Tests:  Recent Labs Lab  04/24/16 0710 04/25/16 0216 04/25/16 1000 04/26/16 0527 04/27/16 0623  AST 25  --  26  --  23  ALT 20  --  19  --  17  ALKPHOS 145*  --  137*  --  151*  BILITOT 1.8*  --  1.7*  --  1.3*  PROT 7.2  --  7.6  --  6.6  ALBUMIN 3.0* 2.9* 2.9* 2.7* 2.5*  2.5*   No results for input(s): LIPASE, AMYLASE in the last 168 hours. No results for input(s): AMMONIA in the last 168 hours. Coagulation Profile:  Recent Labs Lab 04/24/16 0710  INR 1.88*   Cardiac  Enzymes:  Recent Labs Lab 04/24/16 0141 04/24/16 0710 04/24/16 1353 04/24/16 1822  TROPONINI 0.28* 0.34* 0.39* 0.40*   BNP (last 3 results) No results for input(s): PROBNP in the last 8760 hours. HbA1C: No results for input(s): HGBA1C in the last 72 hours. CBG:  Recent Labs Lab 04/26/16 0901 04/26/16 1156 04/26/16 1724 04/26/16 2143 04/27/16 0741  GLUCAP 137* 168* 196* 173* 158*   Lipid Profile: No results for input(s): CHOL, HDL, LDLCALC, TRIG, CHOLHDL, LDLDIRECT in the last 72 hours. Thyroid Function Tests: No results for input(s): TSH, T4TOTAL, FREET4, T3FREE, THYROIDAB in the last 72 hours. Anemia Panel: No results for input(s): VITAMINB12, FOLATE, FERRITIN, TIBC, IRON, RETICCTPCT in the last 72 hours. Sepsis Labs:  Recent Labs Lab 04/24/16 0154 04/24/16 0710 04/24/16 1353  PROCALCITON  --  5.81  --   LATICACIDVEN 2.86* 1.4 1.3    Recent Results (from the past 240 hour(s))  Culture, blood (Routine X 2) w Reflex to ID Panel     Status: None (Preliminary result)   Collection Time: 04/24/16  4:20 AM  Result Value Ref Range Status   Specimen Description BLOOD LEFT ARM  Final   Special Requests AEROBIC BOTTLE ONLY 5ML  Final   Culture NO GROWTH 2 DAYS  Final   Report Status PENDING  Incomplete  Culture, blood (Routine X 2) w Reflex to ID Panel     Status: None (Preliminary result)   Collection Time: 04/24/16  4:25 AM  Result Value Ref Range Status   Specimen Description BLOOD LEFT HAND  Final   Special Requests AEROBIC BOTTLE ONLY 5ML  Final   Culture NO GROWTH 2 DAYS  Final   Report Status PENDING  Incomplete  MRSA PCR Screening     Status: None   Collection Time: 04/24/16  5:05 AM  Result Value Ref Range Status   MRSA by PCR NEGATIVE NEGATIVE Final    Comment:        The GeneXpert MRSA Assay (FDA approved for NASAL specimens only), is one component of a comprehensive MRSA colonization surveillance program. It is not intended to diagnose  MRSA infection nor to guide or monitor treatment for MRSA infections.   Gram stain     Status: None   Collection Time: 04/25/16  4:24 PM  Result Value Ref Range Status   Specimen Description PERITONEAL FLUID  Final   Special Requests NONE  Final   Gram Stain   Final    RARE WBC PRESENT, PREDOMINANTLY MONONUCLEAR NO ORGANISMS SEEN    Report Status 04/25/2016 FINAL  Final  Culture, body fluid-bottle     Status: None (Preliminary result)   Collection Time: 04/25/16  4:24 PM  Result Value Ref Range Status   Specimen Description FLUID PERITONEAL  Final   Special Requests BOTTLES DRAWN AEROBIC AND ANAEROBIC 10CC  Final  Culture NO GROWTH < 24 HOURS  Final   Report Status PENDING  Incomplete         Radiology Studies: Dg Chest 2 View  04/27/2016  CLINICAL DATA:  Pleural effusion; Pt reports cough "for a while"; he denies CP or SOB; h/o ICD insertion, HTN, and DM; non-smoker EXAM: CHEST - 2 VIEW COMPARISON:  04/25/2016 FINDINGS: Moderate right and smaller left pleural effusions. Adjacent consolidation/ atelectasis in the lower lobes. Some improvement in right mid lung aeration. Improvement in interstitial edema. Stable tunneled right IJ hemodialysis catheter, and left subclavian AICD. Cardiomegaly with extensive coronary calcifications. Atheromatous aorta. Visualized bones unremarkable. IMPRESSION: 1. Interval improvement in interstitial edema and right midlung infiltrate 2. Persistent pleural effusions and bibasilar atelectasis/consolidation. 3.  Aortic Atherosclerosis (ICD10-170.0) Electronically Signed   By: Lucrezia Europe M.D.   On: 04/27/2016 10:41   US Paracentesis  04/26/2016  INDICATION: 65 year old with a history of congestive heart failure as well as end-stage renal disease on hemodialysis. He has developed abdominal ascites and a request is made for diagnostic and therapeutic paracentesis. EXAM: ULTRASOUND GUIDED DIAGNOSTIC AND THERAPEUTIC PARACENTESIS MEDICATIONS: 1% lidocaine  COMPLICATIONS: None immediate. PROCEDURE: Informed written consent was obtained from the patient after a discussion of the risks, benefits and alternatives to treatment. A timeout was performed prior to the initiation of the procedure. Initial ultrasound scanning demonstrates a large amount of ascites within the right upper abdominal quadrant. The right upper abdomen was prepped and draped in the usual sterile fashion. 1% lidocaine was used for local anesthesia. Following this, a 19 gauge, 7-cm, Yueh catheter was introduced. An ultrasound image was saved for documentation purposes. The paracentesis was performed. The catheter was removed and a dressing was applied. The patient tolerated the procedure well without immediate post procedural complication. FINDINGS: A total of approximately 6 L of tan chylous colored fluid was removed. Samples were sent to the laboratory as requested by the clinical team. IMPRESSION: Successful ultrasound-guided paracentesis yielding 6 liters of peritoneal fluid. We stopped at 6 L as this is a first time paracentesis maximum. Read by: Saverio Danker, PA-C Electronically Signed   By: Lucrezia Europe M.D.   On: 04/25/2016 16:46   Dg Chest Port 1 View  04/25/2016  CLINICAL DATA:  Cough, shortness of Breath EXAM: PORTABLE CHEST 1 VIEW COMPARISON:  04/24/2016 FINDINGS: There is moderate partially loculated right pleural effusion. Atelectasis or infiltrate right lower lobe. No pulmonary edema. Dual lead cardiac pacemaker with leads in right atrium and right ventricle. Right IJ dialysis catheter with tip in distal SVC. Small left pleural effusion with left basilar atelectasis. IMPRESSION: Moderate partial loculated right pleural effusion, atelectasis or infiltrate in right lower lobe. No pulmonary edema. Small left pleural effusion with left basilar atelectasis. Electronically Signed   By: Lahoma Crocker M.D.   On: 04/25/2016 14:41        Scheduled Meds: . amiodarone  100 mg Oral Daily  .  apixaban  5 mg Oral BID  . atorvastatin  40 mg Oral q1800  . carvedilol  3.125 mg Oral BID WC  . [START ON 04/29/2016] ceFEPime (MAXIPIME) IV  2 g Intravenous Q T,Th,Sat-1800  . clobetasol ointment  1 application Topical BID  . clopidogrel  75 mg Oral Daily  . gabapentin  300 mg Oral QHS  . heparin  2,600 Units Dialysis Once in dialysis  . insulin aspart  0-9 Units Subcutaneous TID WC  . multivitamin with minerals  1 tablet Oral Daily  . polyethylene  glycol  17 g Oral Daily  . silver sulfADIAZINE  1 application Topical BID  . sodium chloride flush  3 mL Intravenous Q12H  . sodium thiosulfate infusion for calciphylaxis  25 g Intravenous Q T,Th,Sa-HD  . [START ON 04/29/2016] vancomycin  750 mg Intravenous Q T,Th,Sa-HD   Continuous Infusions:    LOS: 3 days    Time spent: 58 mins    Chetan Mehring, MD [facility] Pager [phone]  If 7PM-7AM, please contact night-coverage [email or web address] Password [email or web address] 04/27/2016, 11:08 AM

## 2016-04-27 NOTE — Progress Notes (Signed)
Transferred in patient from 2 C.Placed in telemetry bed comfortably.Call light within reached.Education rendered on hight fall risk patient like him and measures to be implemented to prevent such fall and for his safety.Skin issues 1.Patient has unstageable wound on his penile head and and underneath foreskin with slight clear drainage. 2.One stage II 1 cm x 1 cm, two on the left left leg 1.3cm x 1 cm and 2.2 cm x 1 cm .All are dressed with mapilex foam.Per renal MD, all are early stage of calciphylaxis wounds.

## 2016-04-28 LAB — RENAL FUNCTION PANEL
Albumin: 2.5 g/dL — ABNORMAL LOW (ref 3.5–5.0)
Anion gap: 25 — ABNORMAL HIGH (ref 5–15)
BUN: 80 mg/dL — AB (ref 6–20)
CHLORIDE: 85 mmol/L — AB (ref 101–111)
CO2: 20 mmol/L — ABNORMAL LOW (ref 22–32)
Calcium: 10.2 mg/dL (ref 8.9–10.3)
Creatinine, Ser: 7.4 mg/dL — ABNORMAL HIGH (ref 0.61–1.24)
GFR calc Af Amer: 8 mL/min — ABNORMAL LOW (ref >60)
GFR, EST NON AFRICAN AMERICAN: 7 mL/min — AB (ref >60)
GLUCOSE: 159 mg/dL — AB (ref 65–99)
POTASSIUM: 5.7 mmol/L — AB (ref 3.5–5.1)
Phosphorus: 7.7 mg/dL — ABNORMAL HIGH (ref 2.5–4.6)
Sodium: 130 mmol/L — ABNORMAL LOW (ref 135–145)

## 2016-04-28 LAB — CBC
HEMATOCRIT: 30.9 % — AB (ref 39.0–52.0)
Hemoglobin: 10 g/dL — ABNORMAL LOW (ref 13.0–17.0)
MCH: 31.6 pg (ref 26.0–34.0)
MCHC: 32.4 g/dL (ref 30.0–36.0)
MCV: 97.8 fL (ref 78.0–100.0)
Platelets: 146 10*3/uL — ABNORMAL LOW (ref 150–400)
RBC: 3.16 MIL/uL — ABNORMAL LOW (ref 4.22–5.81)
RDW: 16.3 % — AB (ref 11.5–15.5)
WBC: 13.9 10*3/uL — AB (ref 4.0–10.5)

## 2016-04-28 LAB — GLUCOSE, CAPILLARY
GLUCOSE-CAPILLARY: 144 mg/dL — AB (ref 65–99)
GLUCOSE-CAPILLARY: 195 mg/dL — AB (ref 65–99)
Glucose-Capillary: 147 mg/dL — ABNORMAL HIGH (ref 65–99)
Glucose-Capillary: 148 mg/dL — ABNORMAL HIGH (ref 65–99)

## 2016-04-28 LAB — HEMOGLOBIN A1C
Hgb A1c MFr Bld: 5.9 % — ABNORMAL HIGH (ref 4.8–5.6)
Mean Plasma Glucose: 123 mg/dL

## 2016-04-28 LAB — HEPATITIS PANEL, ACUTE
HEP A IGM: NEGATIVE
HEP B S AG: NEGATIVE
Hep B C IgM: NEGATIVE

## 2016-04-28 MED ORDER — SUCROFERRIC OXYHYDROXIDE 500 MG PO CHEW
1000.0000 mg | CHEWABLE_TABLET | Freq: Three times a day (TID) | ORAL | Status: DC
  Administered 2016-04-28 – 2016-05-02 (×9): 1000 mg via ORAL
  Filled 2016-04-28 (×15): qty 2

## 2016-04-28 MED ORDER — DEXTROSE 5 % IV SOLN
1.0000 g | Freq: Once | INTRAVENOUS | Status: AC
  Administered 2016-04-28: 1 g via INTRAVENOUS
  Filled 2016-04-28: qty 1

## 2016-04-28 NOTE — Progress Notes (Signed)
Pharmacy Antibiotic Note  Tony Freeman is a 65 y.o. male with ESRD on HD admitted on 04/24/2016  LLE pain and SOB.  Pharmacy has been consulted for cefepime dosing for pneumonia.  Patient's HD schedule is normally TTS. He has required additional HD sessions this admission and will get another session of HD today.  Plan: - Cefepime 1g IV x1 today after HD - F/u HD schedule and need for adjustment of further cefepime doses  - Monitor C&S, CBC and length of therapy   Height: 5\' 6"  (167.6 cm) Weight: 174 lb 13.2 oz (79.3 kg) IBW/kg (Calculated) : 63.8  Temp (24hrs), Avg:98.1 F (36.7 C), Min:97.5 F (36.4 C), Max:98.6 F (37 C)   Recent Labs Lab 04/24/16 0154 04/24/16 0710 04/24/16 1353 04/25/16 0216 04/26/16 0527 04/27/16 0623 04/28/16 0347  WBC  --  25.6*  --  17.2* 13.5* 12.9* 13.9*  CREATININE  --  7.02*  --  4.68* 4.45* 6.20* 7.40*  LATICACIDVEN 2.86* 1.4 1.3  --   --   --   --     Estimated Creatinine Clearance: 10.1 mL/min (by C-G formula based on Cr of 7.4).    No Known Allergies  Antimicrobials this admission: Vanc 6/29 >> 7/3  Cefepime 6/29 >>   Microbiology results: 6/29 BCx >> ngtd  6/29 UCx >> ngtd  6/29 MRSA PCR >> neg  6/30 Peritoneal fluid >> no organisms seen  Thank you for allowing pharmacy to be a part of this patient's care.  Dimitri Ped, PharmD. PGY-1 Pharmacy Resident Pager: [phone] 04/28/2016 2:15 PM

## 2016-04-28 NOTE — Progress Notes (Signed)
PROGRESS NOTE    Tony Freeman  [id number] DOB: 13-Oct-1951 DOA: 04/24/2016 PCP: Tanya Nones, MD    Brief Narrative:  Tony Freeman is a 65 y.o. male with with history of chronic atrial fibrillation, ischemic cardiomyopathy status post ICD placement, CAD status post stenting, diabetes mellitus type 2, ESRD on hemodialysis on Tuesday Thursday since Saturday and chronic combined systolic and diastolic heart failure presents to the ER because of increasing pain in his left lower extremity. Patient states he has been having pain in his left lower extremity over the last 2-3 days. Denies any fever or chills. Denies any trauma. In the ER patient was found to be febrile with labs showing leukocytosis and patient was hypoxic requiring BiPAP. Chest x-ray was showing airspace opacity and right-sided pleural effusion which was worsening. Patient labs also show Hyprkalemia for which patient was given calcium gluconate and sodium bicarbonate and insulin D50. On call nephrologist Dr. Lorrene Reid was consulted by ER physician and patient is to get early dialysis. Patient states he is mildly short of breath and has some nonproductive cough otherwise denies any chest pain. On exam patient's both lower extremity edematous with mild tenderness in the left lower extremity extending from the left knee to the foot. There is ulceration on the posterior aspect of his left leg. No active discharge. 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   ED Course: Was started on antibiotics and was given insulin and calcium gluconate sodium bicarbonate and D50  Assessment & Plan:   Principal Problem:   Acute respiratory failure with hypoxia (HCC) Active Problems:   Acute on chronic combined systolic and diastolic heart failure (HCC)   DM (diabetes mellitus), type 2, uncontrolled, with  renal complications (HCC)   CAD (coronary artery disease)   Lower extremity edema   Atrial fibrillation (HCC)   HCAP (healthcare-associated pneumonia)   ESRD (end stage renal disease) on dialysis (HCC)   Leg pain   Hyperkalemia   Hypervolemia   Pleural effusion   Ascites   Leukocytosis   Hypoxia   Calciphylaxis   Pressure ulcer  #1 acute respiratory failure with hypoxia secondary to volume overload/ acute on chronic systolic heart failure and probable healthcare associated pneumonia/ Patient with clinical improvement. Patient status post hemodialysis 04/25/2016 with 3.5 L removed. Patient is - 4.294 L during this hospitalization. Cardiac enzymes which was cycled were mildly elevated. Patient with 2-D echo from 11/29/2015 with a EF of 15-20% with severe diffuse hypokinesis and moderate to severe pulmonary artery systolic pressure with a PA peak pressure of 65 mmHg. Patient was on BiPAP on admission currently on nasal cannula. Patient may require 4 days of hemodialysis per week per cardiology recommendations. Patient per nephrology will likely require daily hemodialysis for the next one week and then hemodialysis 4 times a week thereafter. Continue Coreg, Lipitor. Patient has been pancultured results pending. Urine Legionella antigen and urine pneumococcus antigen pending. Continue empiric IV cefepime. Discontinue IV vancomycin. Nephrology and cardiology following.  #2 end-stage renal disease on hemodialysis Patient with hemodialysis. Patient with - 4.294 L during this hospitalization. Patient with some clinical improvement. Continue sodium thiosulfate for calciphylaxis. Per nephrology.  #3 hyperkalemia Improved since admission. Potassium currently at 5.7. Patient asymptomatic. Status post sodium bicarbonate, D50, insulin, calcium gluconate. Patient received hemodialysis with improvement with hyperkalemia. Patient for hemodialysis today.  #4 probable healthcare associated pneumonia Per chest  x-ray. Patient also noted to be hypoxic on admission requiring BiPAP with a significant leukocytosis. WBC trending down. Urine cultures pending. Blood cultures pending. Urine Legionella antigen and urine pneumococcus antigen pending. Continue oxygen, empiric IV cefepime. Discontinue IV vancomycin.   #5 acute on chronic systolic heart failure Cardiac enzymes mildly elevated which was felt to be secondary to CHF. Patient denies any chest pain. 2-D echo from 11/29/2015 with a EF of 15-20% with severe diffuse hypokinesis and moderate to severe pulmonary artery systolic pressure with a PA peak pressure of 65 mmHg. Patient is -4.294 L during this hospitalization. Patient received hemodialysis daily since admission. No hemodialysis over the weekend as patient refused. Patient has been seen in consult by cardiology who are recommending possible four-day hemodialysis per week to keep fluid off.  Per nephrology patient will likely require hemodialysis daily for at least one week and then HD 4 times daily per week. Continue Coreg, Lipitor, Plavix. Patient has been switched from Coumadin to apixaban per nephrology. Unable to use ACE inhibitor secondary to end-stage renal disease. Patient in agreement for hemodialysis today. Cardiology following and appreciate their input and recommendations.  #6 abdominal ascites S/p Diagnostic and therapeutic paracentesis with 6 L of fluid removed. Patient status post albumin IV. Patient with no abdominal pain. Gram stain negative. Fluid cultures pending. Fluid from paracentesis with 2003 WBCs and turbid in appearance. Patient with clinical improvement. Continue empiric IV antibiotics.  #7 paroxysmal atrial fibrillation Currently rate controlled. Continue Coreg and amiodarone. Coumadin has been changed to apixaban per nephrology due to patient's calciphylaxis. Continue Plavix. Cardiology following and appreciate input and recommendations.  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 Per nephrology patient will likely require daily dialysis for at least a week and then HD 4 times weekly thereafter. Pain management.  #9 well controlled diabetes mellitus type 2 CBGs have ranged from 147-194. Hemoglobin A1c 5.9. Sliding scale insulin.  #10 coronary artery disease/elevated troponin Elevated troponin likely secondary to CHF. Patient denies any active chest pain. Continue Coreg, Lipitor, Plavix, amiodarone. Coumadin has been changed to apixaban per nephrology secondary to calciphylaxis. Cardiology following and appreciate input and recommendations.  #11 leukocytosis Maybe secondary to probable healthcare associated pneumonia. Patient has been pancultured results pending. Urinalysis pending. WBC trending down. Patient status post paracentesis 04/25/2016 with 6 L of fluid removed. Gram stain negative. Fluid cultures pending. Cytology pending. Continue empiric IV cefepime. Discontinue IV vancomycin.  #12 right pleural effusion Patient with chronic right pleural effusion. Repeat chest x-ray 04/25/2016 with moderate partially loculated right pleural effusion, atelectasis or infiltrate in the right lower lobe. No pulmonary edema. Small left pleural effusion with left basilar atelectasis. Repeat chest x-ray this morning with interval improvement in interstitial edema and right midlung infiltrate. Persistent pleural effusions and bibasilar atelectasis/consolidation. Case discussed with Dr. Chase Caller of critical care who reviewed the films and feels like pleural effusion likely secondary to patient's end-stage renal disease and chronic systolic heart failure ( EF 15-20%) and at this time to continue current management with hemodialysis for volume overload. Continue empiric IV antibiotics. Pulmonary toilet. Follow.  #13 nodular contour of liver Per CT chest. Patient also noted to have ascites  status post paracentesis with 6 L of fluid removed. Patient with no significant alcoholic history. Patient with no history of IV drug use. Patient however does have a systolic heart failure EF of 15-20% and liver abnormalities could be secondary to right heart  failure. Acute hepatitis panel pending. Follow.  #14 prognosis Patient with multiple comorbidities including ischemic cardiomyopathy status post ICD placement with EF of 15-20%, end-stage renal disease on hemodialysis, nodular contour of liver noted on chest CT with ascites status post paracentesis with 6 L of fluid removed. Patient also with calciphylaxis. Patient with significant comorbidities and as such will consult with palliative care for goals of care meeting.   DVT prophylaxis: apixaban Code Status: Full Family Communication: Updated patient. No family at bedside. Disposition Plan: Home when medically stable.   Consultants:   Nephrology: Dr. Lorrene Reid 04/24/2016  Cardiology: Dr. Aundra Dubin 04/24/2016  Procedures:   Lower extremity Dopplers 04/24/2016  Abdominal ultrasound 04/24/2016  Plain films of the left tib-fib 04/24/2016  Chest x-ray 04/24/2016, 04/25/2016  CT chest 04/24/2016  Ultrasound guided Paracentesis with 6 L of tan chylous colored fluid removed 04/25/2016  Antimicrobials:   IV cefepime 04/24/2016  IV  vancomycin 04/24/2016>>>>> 04/28/2016   Subjective: Patient denies any chest pain. Improvement with shortness of breath. Patient states some improvement with left lower extremity pain. Patient states significant improvement with abdominal distention after paracentesis and increased ability to breathe better.  Objective: Filed Vitals:   04/27/16 1612 04/27/16 2037 04/28/16 0417 04/28/16 0907  BP: 131/42 113/69 131/58   Pulse: 59 69 56 60  Temp: 98.4 F (36.9 C) 97.7 F (36.5 C) 98.2 F (36.8 C)   TempSrc: Oral Oral Oral   Resp: 18 17 16    Height:      Weight:   76.4 kg (168 lb 6.9 oz)   SpO2:  93% 100% 98%     Intake/Output Summary (Last 24 hours) at 04/28/16 1124 Last data filed at 04/28/16 1100  Gross per 24 hour  Intake    360 ml  Output      0 ml  Net    360 ml   Filed Weights   04/26/16 0842 04/26/16 2141 04/28/16 0417  Weight: 72.077 kg (158 lb 14.4 oz) 76.9 kg (169 lb 8.5 oz) 76.4 kg (168 lb 6.9 oz)    Examination:  General exam: Appears calm and comfortable.  Respiratory system: Decreased breath sounds in the bases.  Respiratory effort normal. Cardiovascular system: S1 & S2 heard, RRR. No JVD, murmurs, rubs, gallops or clicks. 2 + BLE edema. Gastrointestinal system: Abdomen is Less distended, soft and nontender. No organomegaly or masses felt. Normal bowel sounds heard. Central nervous system: Alert and oriented. No focal neurological deficits. Extremities: Symmetric 5 x 5 power. Skin: LLE with ulceration/necrosis on posterior aspect. Blister noted on LLE. Psychiatry: Judgement and insight appear normal. Mood & affect appropriate.     Data Reviewed: I have personally reviewed following labs and imaging studies  CBC:  Recent Labs Lab 04/24/16 0710 04/25/16 0216 04/26/16 0527 04/27/16 0623 04/28/16 0347  WBC 25.6* 17.2* 13.5* 12.9* 13.9*  NEUTROABS 22.1* 14.5* 11.3* 10.6*  --   HGB 10.5* 9.8* 10.1* 10.2* 10.0*  HCT 32.6* 30.8* 31.6* 31.6* 30.9*  MCV 100.0 101.3* 100.0 99.1 97.8  PLT 175 164 146* 154 [id number]*   Basic Metabolic Panel:  Recent Labs Lab 04/24/16 0710 04/24/16 1353 04/25/16 0216 04/26/16 0527 04/27/16 0623 04/28/16 0347  NA 130*  --  134* 133* 129* 130*  K 6.7*  --  4.9 4.6 5.4* 5.7*  CL 96*  --  94* 95* 90* 85*  CO2 19*  --  24 24 22  20*  GLUCOSE 113*  --  130* 145* 145* 159*  BUN 75*  --  41* 37* 59* 80*  CREATININE 7.02*  --  4.68* 4.45* 6.20* 7.40*  CALCIUM 9.8  --  9.9 9.5 9.3 10.2  MG  --   --  2.1  --   --   --   PHOS  --  4.0 5.3* 5.5* 6.6* 7.7*   GFR: Estimated Creatinine Clearance: 9.2 mL/min (by C-G formula based  on Cr of 7.4). Liver Function Tests:  Recent Labs Lab 04/24/16 0710 04/25/16 0216 04/25/16 1000 04/26/16 0527 04/27/16 0623 04/28/16 0347  AST 25  --  26  --  23  --   ALT 20  --  19  --  17  --   ALKPHOS 145*  --  137*  --  151*  --   BILITOT 1.8*  --  1.7*  --  1.3*  --   PROT 7.2  --  7.6  --  6.6  --   ALBUMIN 3.0* 2.9* 2.9* 2.7* 2.5*  2.5* 2.5*   No results for input(s): LIPASE, AMYLASE in the last 168 hours. No results for input(s): AMMONIA in the last 168 hours. Coagulation Profile:  Recent Labs Lab 04/24/16 0710  INR 1.88*   Cardiac Enzymes:  Recent Labs Lab 04/24/16 0141 04/24/16 0710 04/24/16 1353 04/24/16 1822  TROPONINI 0.28* 0.34* 0.39* 0.40*   BNP (last 3 results) No results for input(s): PROBNP in the last 8760 hours. HbA1C:  Recent Labs  04/27/16 0623  HGBA1C 5.9*   CBG:  Recent Labs Lab 04/27/16 0741 04/27/16 1111 04/27/16 1611 04/27/16 2040 04/28/16 0731  GLUCAP 158* 167* 194* 155* 147*   Lipid Profile: No results for input(s): CHOL, HDL, LDLCALC, TRIG, CHOLHDL, LDLDIRECT in the last 72 hours. Thyroid Function Tests: No results for input(s): TSH, T4TOTAL, FREET4, T3FREE, THYROIDAB in the last 72 hours. Anemia Panel: No results for input(s): VITAMINB12, FOLATE, FERRITIN, TIBC, IRON, RETICCTPCT in the last 72 hours. Sepsis Labs:  Recent Labs Lab 04/24/16 0154 04/24/16 0710 04/24/16 1353  PROCALCITON  --  5.81  --   LATICACIDVEN 2.86* 1.4 1.3    Recent Results (from the past 240 hour(s))  Culture, blood (Routine X 2) w Reflex to ID Panel     Status: None (Preliminary result)   Collection Time: 04/24/16  4:20 AM  Result Value Ref Range Status   Specimen Description BLOOD LEFT ARM  Final   Special Requests AEROBIC BOTTLE ONLY 5ML  Final   Culture NO GROWTH 4 DAYS  Final   Report Status PENDING  Incomplete  Culture, blood (Routine X 2) w Reflex to ID Panel     Status: None (Preliminary result)   Collection Time: 04/24/16   4:25 AM  Result Value Ref Range Status   Specimen Description BLOOD LEFT HAND  Final   Special Requests AEROBIC BOTTLE ONLY 5ML  Final   Culture NO GROWTH 4 DAYS  Final   Report Status PENDING  Incomplete  MRSA PCR Screening     Status: None   Collection Time: 04/24/16  5:05 AM  Result Value Ref Range Status   MRSA by PCR NEGATIVE NEGATIVE Final    Comment:        The GeneXpert MRSA Assay (FDA approved for NASAL specimens only), is one component of a comprehensive MRSA colonization surveillance program. It is not intended to diagnose MRSA infection nor to guide or monitor treatment for MRSA infections.   Gram stain     Status: None   Collection Time: 04/25/16  4:24 PM  Result  Value Ref Range Status   Specimen Description PERITONEAL FLUID  Final   Special Requests NONE  Final   Gram Stain   Final    RARE WBC PRESENT, PREDOMINANTLY MONONUCLEAR NO ORGANISMS SEEN    Report Status 04/25/2016 FINAL  Final  Culture, body fluid-bottle     Status: None (Preliminary result)   Collection Time: 04/25/16  4:24 PM  Result Value Ref Range Status   Specimen Description FLUID PERITONEAL  Final   Special Requests BOTTLES DRAWN AEROBIC AND ANAEROBIC 10CC  Final   Culture NO GROWTH 3 DAYS  Final   Report Status PENDING  Incomplete         Radiology Studies: Dg Chest 2 View  04/27/2016  CLINICAL DATA:  Pleural effusion; Pt reports cough "for a while"; he denies CP or SOB; h/o ICD insertion, HTN, and DM; non-smoker EXAM: CHEST - 2 VIEW COMPARISON:  04/25/2016 FINDINGS: Moderate right and smaller left pleural effusions. Adjacent consolidation/ atelectasis in the lower lobes. Some improvement in right mid lung aeration. Improvement in interstitial edema. Stable tunneled right IJ hemodialysis catheter, and left subclavian AICD. Cardiomegaly with extensive coronary calcifications. Atheromatous aorta. Visualized bones unremarkable. IMPRESSION: 1. Interval improvement in interstitial edema and right  midlung infiltrate 2. Persistent pleural effusions and bibasilar atelectasis/consolidation. 3.  Aortic Atherosclerosis (ICD10-170.0) Electronically Signed   By: Lucrezia Europe M.D.   On: 04/27/2016 10:41        Scheduled Meds: . amiodarone  100 mg Oral Daily  . apixaban  5 mg Oral BID  . atorvastatin  40 mg Oral q1800  . carvedilol  3.125 mg Oral BID WC  . [START ON 04/29/2016] ceFEPime (MAXIPIME) IV  2 g Intravenous Q T,Th,Sat-1800  . clobetasol ointment  1 application Topical BID  . clopidogrel  75 mg Oral Daily  . gabapentin  300 mg Oral QHS  . heparin  2,600 Units Dialysis Once in dialysis  . insulin aspart  0-9 Units Subcutaneous TID WC  . multivitamin with minerals  1 tablet Oral Daily  . polyethylene glycol  17 g Oral Daily  . silver sulfADIAZINE  1 application Topical BID  . sodium chloride flush  3 mL Intravenous Q12H  . sodium thiosulfate infusion for calciphylaxis  25 g Intravenous Q T,Th,Sa-HD  . sucroferric oxyhydroxide  1,000 mg Oral TID WC   Continuous Infusions:    LOS: 4 days    Time spent: 5 mins    Racquelle Hyser, MD [facility] Pager [phone]  If 7PM-7AM, please contact night-coverage [email or web address] Password [email or web address] 04/28/2016, 11:24 AM

## 2016-04-28 NOTE — Progress Notes (Signed)
   Remains stable from a cardiac perspective. The HF team will sign off. Please contact us if any questions arise.   Bensimhon, Daniel,MD 7:06 PM

## 2016-04-28 NOTE — Care Management Important Message (Signed)
Important Message  Patient Details  Name: Tony Freeman MRN: [id number] Date of Birth: 02-11-1951   Medicare Important Message Given:  Yes    [name] May 04/28/2016, 11:58 AM

## 2016-04-28 NOTE — Progress Notes (Signed)
Subjective:  Minimal leg pain this am / agrees to HD today /Son in room  Objective Vital signs in last 24 hours: Filed Vitals:   04/27/16 1612 04/27/16 2037 04/28/16 0417 04/28/16 0907  BP: 131/42 113/69 131/58   Pulse: 59 69 56 60  Temp: 98.4 F (36.9 C) 97.7 F (36.5 C) 98.2 F (36.8 C)   TempSrc: Oral Oral Oral   Resp: 18 17 16    Height:      Weight:   76.4 kg (168 lb 6.9 oz)   SpO2: 93% 100% 98%    Weight change: 4.323 kg (9 lb 8.5 oz)  Physical Exam: General: alert , NAD , Pleasantly disoriented to day   Lung  clear bilat  Card Irreg irreg Vrate  Stable in 60s  no mrg Abd= slightly  distended, nontender +bs GU glans has patchy skin discoloration, no actual gangrene Extremities = bilat LE  edema 1+/ L calf indurated/swollen w some patchy blistering type purplish skin lesions on lat/ post calf, very tender Dialysis  access=RUA AVF pos bruit / R IJ Perm cath     OP Dialysis: AF TTS 78kg 3K/2.25 bath P4 Hep 2600 IJ cath (maturing RUA AVF) Na thio 25 gm (for calciphylaxis)   Problem/Plan: 1 L leg pain/ swelling/ erythema - cellulitis and/or calciphylaxis; blood cultures neg . On iv antib  Per admit 2 ESRD HD TTS Last hd was Fri (he refused sat )  discussed with pt a and son in room need for hd today  And in am to keep on schedule  With his multiple problems  They agree  With hd plans  3 Vol excess/ pulm edema/ Ascites ( sp 6 l tap 04/25/16) - HD again today off schedule, max UF, get dry wt down, limit fluids amap. Losing wt probably from Calciphylaxis  affecting appetiteChron afib on amio, coumadin changed to apixaban /CXR 7/2 showing interval improvement interstitial edema / uf today 3 to 3.5 liters (cannot stand sec to leg pain ) 2 kg under prior edw  4 Possible calciphylaxis L leg / penis - suspected diagnosis. past week  Noted per Dr Jonnie Finner  Empiric Rx at this time, consider skin biopsy during the week. Aggressive HD is recommended , however patient  Last week  unwilling to have HD again  3rd in a row If calciphylaxis is causing his leg pain, the goal is to get as much Ca out of the body as possible using low Ca bath and avoiding all Ca/ vit D products. The goal uncorr Ca for him will be 7.0- 8.0 (current Ca 10 on admit, down to 9.5 today). Also use Na thiosulfate as antioxidant w HD tiw. Stopped coumadin, now on apixaban. 5 Anemia (ESRD)  hgb 10.0 not on op esa  Or in hosp fu hgbs  6 MBD- with Calciphylaxis -  Am labs = Phos 7.7 . correc ca 11.4 9 start binder Velphoro  in hosp/ 2.0 ca bath  7 CAD hx stent 8Chron afib on amio, coumadin changed to apixaban 9 Hx CVA 10 COPD  11 DM on insulin   12 Multiple comorbidities - pt has multi-organ dysfunction (severe bivent CHF, now cirrhosis/ ascites, ESRD) and now probably has new calciphylaxis.  patient has low health literacy/ Dr. Jonnie Finner  spoke w pt's son and painted a not so optimistic picture, told him we will do what we can and hope for the best.   Tony Haber, PA-C [place] [phone] 04/28/2016,9:35 AM  LOS:  4 days  I have seen and examined this patient and agree with plan per Tony Freeman.  Will plan HD today and again tomorrow.  Discussed the possibility of NHP if pt unable to ambulate.  He lives with his son but if pt unable to walk, his son will be unable to manage at home. Tony Kollmann T,MD 04/28/2016 11:08 AM Labs: Basic Metabolic Panel:  Recent Labs Lab 04/26/16 0527 04/27/16 0623 04/28/16 0347  NA 133* 129* 130*  K 4.6 5.4* 5.7*  CL 95* 90* 85*  CO2 24 22 20*  GLUCOSE 145* 145* 159*  BUN 37* 59* 80*  CREATININE 4.45* 6.20* 7.40*  CALCIUM 9.5 9.3 10.2  PHOS 5.5* 6.6* 7.7*   Liver Function Tests:  Recent Labs Lab 04/24/16 0710  04/25/16 1000 04/26/16 0527 04/27/16 0623 04/28/16 0347  AST 25  --  26  --  23  --   ALT 20  --  19  --  17  --   ALKPHOS 145*  --  137*  --  151*  --   BILITOT 1.8*  --  1.7*  --  1.3*  --   PROT 7.2  --   7.6  --  6.6  --   ALBUMIN 3.0*  < > 2.9* 2.7* 2.5*  2.5* 2.5*  < > = values in this interval not displayed. No results for input(s): LIPASE, AMYLASE in the last 168 hours. No results for input(s): AMMONIA in the last 168 hours. CBC:  Recent Labs Lab 04/24/16 0710 04/25/16 0216 04/26/16 0527 04/27/16 0623 04/28/16 0347  WBC 25.6* 17.2* 13.5* 12.9* 13.9*  NEUTROABS 22.1* 14.5* 11.3* 10.6*  --   HGB 10.5* 9.8* 10.1* 10.2* 10.0*  HCT 32.6* 30.8* 31.6* 31.6* 30.9*  MCV 100.0 101.3* 100.0 99.1 97.8  PLT 175 164 146* 154 146*   Cardiac Enzymes:  Recent Labs Lab 04/24/16 0141 04/24/16 0710 04/24/16 1353 04/24/16 1822  TROPONINI 0.28* 0.34* 0.39* 0.40*   CBG:  Recent Labs Lab 04/27/16 0741 04/27/16 1111 04/27/16 1611 04/27/16 2040 04/28/16 0731  GLUCAP 158* 167* 194* 155* 147*    Studies/Results: Dg Chest 2 View  04/27/2016  CLINICAL DATA:  Pleural effusion; Pt reports cough "for a while"; he denies CP or SOB; h/o ICD insertion, HTN, and DM; non-smoker EXAM: CHEST - 2 VIEW COMPARISON:  04/25/2016 FINDINGS: Moderate right and smaller left pleural effusions. Adjacent consolidation/ atelectasis in the lower lobes. Some improvement in right mid lung aeration. Improvement in interstitial edema. Stable tunneled right IJ hemodialysis catheter, and left subclavian AICD. Cardiomegaly with extensive coronary calcifications. Atheromatous aorta. Visualized bones unremarkable. IMPRESSION: 1. Interval improvement in interstitial edema and right midlung infiltrate 2. Persistent pleural effusions and bibasilar atelectasis/consolidation. 3.  Aortic Atherosclerosis (ICD10-170.0) Electronically Signed   By: Lucrezia Europe M.D.   On: 04/27/2016 10:41   Medications:   . amiodarone  100 mg Oral Daily  . apixaban  5 mg Oral BID  . atorvastatin  40 mg Oral q1800  . carvedilol  3.125 mg Oral BID WC  . [START ON 04/29/2016] ceFEPime (MAXIPIME) IV  2 g Intravenous Q Freeman,Th,Sat-1800  . clobetasol ointment   1 application Topical BID  . clopidogrel  75 mg Oral Daily  . gabapentin  300 mg Oral QHS  . heparin  2,600 Units Dialysis Once in dialysis  . insulin aspart  0-9 Units Subcutaneous TID WC  . multivitamin with minerals  1 tablet Oral Daily  . polyethylene glycol  17 g Oral  Daily  . silver sulfADIAZINE  1 application Topical BID  . sodium chloride flush  3 mL Intravenous Q12H  . sodium thiosulfate infusion for calciphylaxis  25 g Intravenous Q Freeman,Th,Sa-HD

## 2016-04-29 DIAGNOSIS — M79606 Pain in leg, unspecified: Secondary | ICD-10-CM | POA: Insufficient documentation

## 2016-04-29 DIAGNOSIS — Z7189 Other specified counseling: Secondary | ICD-10-CM | POA: Insufficient documentation

## 2016-04-29 DIAGNOSIS — Z515 Encounter for palliative care: Secondary | ICD-10-CM

## 2016-04-29 DIAGNOSIS — M79602 Pain in left arm: Secondary | ICD-10-CM

## 2016-04-29 LAB — RENAL FUNCTION PANEL
ALBUMIN: 2.5 g/dL — AB (ref 3.5–5.0)
Anion gap: 14 (ref 5–15)
BUN: 45 mg/dL — AB (ref 6–20)
CO2: 22 mmol/L (ref 22–32)
CREATININE: 5.22 mg/dL — AB (ref 0.61–1.24)
Calcium: 8.7 mg/dL — ABNORMAL LOW (ref 8.9–10.3)
Chloride: 96 mmol/L — ABNORMAL LOW (ref 101–111)
GFR calc Af Amer: 12 mL/min — ABNORMAL LOW (ref >60)
GFR, EST NON AFRICAN AMERICAN: 11 mL/min — AB (ref >60)
Glucose, Bld: 132 mg/dL — ABNORMAL HIGH (ref 65–99)
PHOSPHORUS: 6 mg/dL — AB (ref 2.5–4.6)
POTASSIUM: 5.1 mmol/L (ref 3.5–5.1)
Sodium: 132 mmol/L — ABNORMAL LOW (ref 135–145)

## 2016-04-29 LAB — CBC
HCT: 30.6 % — ABNORMAL LOW (ref 39.0–52.0)
Hemoglobin: 9.8 g/dL — ABNORMAL LOW (ref 13.0–17.0)
MCH: 31.5 pg (ref 26.0–34.0)
MCHC: 32 g/dL (ref 30.0–36.0)
MCV: 98.4 fL (ref 78.0–100.0)
PLATELETS: 156 10*3/uL (ref 150–400)
RBC: 3.11 MIL/uL — ABNORMAL LOW (ref 4.22–5.81)
RDW: 16.7 % — AB (ref 11.5–15.5)
WBC: 12.3 10*3/uL — AB (ref 4.0–10.5)

## 2016-04-29 LAB — CULTURE, BLOOD (ROUTINE X 2)
CULTURE: NO GROWTH
CULTURE: NO GROWTH

## 2016-04-29 LAB — GLUCOSE, CAPILLARY
GLUCOSE-CAPILLARY: 96 mg/dL (ref 65–99)
Glucose-Capillary: 176 mg/dL — ABNORMAL HIGH (ref 65–99)
Glucose-Capillary: 197 mg/dL — ABNORMAL HIGH (ref 65–99)

## 2016-04-29 MED ORDER — OXYCODONE-ACETAMINOPHEN 5-325 MG PO TABS
1.0000 | ORAL_TABLET | ORAL | Status: DC | PRN
  Administered 2016-04-29: 2 via ORAL
  Administered 2016-04-29: 1 via ORAL
  Administered 2016-04-29 – 2016-05-01 (×5): 2 via ORAL
  Filled 2016-04-29 (×7): qty 2

## 2016-04-29 MED ORDER — OXYCODONE-ACETAMINOPHEN 5-325 MG PO TABS
ORAL_TABLET | ORAL | Status: AC
  Filled 2016-04-29: qty 1

## 2016-04-29 MED ORDER — DEXTROSE 5 % IV SOLN
2.0000 g | INTRAVENOUS | Status: DC
  Administered 2016-04-29 – 2016-05-01 (×2): 2 g via INTRAVENOUS
  Filled 2016-04-29: qty 2

## 2016-04-29 MED ORDER — DEXTROSE 5 % IV SOLN
1.0000 g | Freq: Once | INTRAVENOUS | Status: AC
  Filled 2016-04-29: qty 1

## 2016-04-29 MED ORDER — FENTANYL CITRATE (PF) 100 MCG/2ML IJ SOLN
12.5000 ug | INTRAMUSCULAR | Status: DC | PRN
  Administered 2016-04-30 – 2016-05-01 (×2): 12.5 ug via INTRAVENOUS
  Filled 2016-04-29 (×3): qty 2

## 2016-04-29 NOTE — Evaluation (Signed)
Physical Therapy Evaluation Patient Details Name: Tony Freeman MRN: [id number] DOB: 1951/07/27 Today's Date: 04/29/2016   History of Present Illness  Tony Freeman is a 65 y.o. male with with history of chronic atrial fibrillation, ischemic cardiomyopathy status post ICD placement, CAD status post stenting, diabetes mellitus type 2, ESRD on hemodialysis on Tuesday Thursday since Saturday and chronic combined systolic and diastolic heart failure.  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   Clinical Impression  Patient presents with decreased independence with mobility due to poor activity tolerance with pain L LE and weakness.  Feel he will benefit from skilled PT in the acute setting to allow return home following SNF level rehab stay.     Follow Up Recommendations SNF;Supervision/Assistance - 24 hour    Equipment Recommendations  None recommended by PT    Recommendations for Other Services       Precautions / Restrictions Precautions Precautions: Fall Restrictions Weight Bearing Restrictions: No      Mobility  Bed Mobility Overal bed mobility: Needs Assistance Bed Mobility: Supine to Sit;Sit to Supine     Supine to sit: Mod assist Sit to supine: Mod assist;+2 for physical assistance   General bed mobility comments: son assisted to get pt into bed with trunk and spinning on pad; therapist assist with legs into bed  Transfers Overall transfer level: Needs assistance Equipment used: Rolling walker (2 wheeled) Transfers: Sit to/from Stand Sit to Stand: Min assist;+2 physical assistance         General transfer comment: son assist also for sit to stand first trial, repeated x3 after ambulation for strengthening and for side steps to [facility] with +1 min A  Ambulation/Gait Ambulation/Gait assistance: Min  assist;Mod assist;+2 safety/equipment Ambulation Distance (Feet): 22 Feet Assistive device: Rolling walker (2 wheeled) Gait Pattern/deviations: Step-to pattern;Decreased stride length;Antalgic;Trunk flexed;Shuffle;Decreased stance time - left;Decreased step length - right     General Gait Details: Pain on L LE and very antalgic at times, but other times tolerating more weight on L LE.  Seems weak as well, but determined to get out to hallway prior to returning to bed.  Felt he wanted to go again after back to bed, so encouraged to practice sit to stand for LE strengthening.   Stairs            Wheelchair Mobility    Modified Rankin (Stroke Patients Only)       Balance Overall balance assessment: Needs assistance   Sitting balance-Leahy Scale: Good Sitting balance - Comments: sitting EOB no UE support   Standing balance support: Bilateral upper extremity supported Standing balance-Leahy Scale: Poor Standing balance comment: heavy UE support for balance and due to LE pain                             Pertinent Vitals/Pain Pain Score: 8  Pain Location: L lower leg Pain Descriptors / Indicators: Sore Pain Intervention(s): Monitored during session;Premedicated before session;Limited activity within patient's tolerance    Home Living Family/patient expects to be discharged to:: Skilled nursing facility Living Arrangements: Children;Other relatives Available Help at Discharge: Family;Available 24 hours/day Type of Home: Apartment Home Access: Level entry     Home Layout: One level Home Equipment: Walker - 2 wheels;Shower seat;Grab bars - tub/shower;Hand held shower head Additional Comments: has been sponge bathing  due to port    Prior Function Level of Independence: Needs assistance   Gait / Transfers Assistance Needed: was not using assistive device for walking at home, but limited endurance due to "his heart" per son  ADL's / Homemaking Assistance Needed:  son assists with showering--pt can do UB except for back; pt can do most of dressing except for socks and shoes        Hand Dominance   Dominant Hand: Right    Extremity/Trunk Assessment               Lower Extremity Assessment: RLE deficits/detail;LLE deficits/detail RLE Deficits / Details: AROM grossly WFL, strength hip flexion 3/5, knee extension 4-/5 LLE Deficits / Details: AROM grossly WFL, strength hip flexion 3/5, knee extension 4-/5; noted multiple wounds covered with pink bandages     Communication   Communication: No difficulties  Cognition Arousal/Alertness: Awake/alert Behavior During Therapy: WFL for tasks assessed/performed Overall Cognitive Status: Impaired/Different from baseline Area of Impairment: Safety/judgement;Problem solving         Safety/Judgement: Decreased awareness of deficits   Problem Solving: Slow processing      General Comments General comments (skin integrity, edema, etc.): son, sister, brother in law and aunt in the room    Exercises        Assessment/Plan    PT Assessment Patient needs continued PT services  PT Diagnosis Difficulty walking;Acute pain   PT Problem List Decreased strength;Decreased activity tolerance;Decreased balance;Decreased mobility;Pain;Decreased safety awareness;Decreased knowledge of use of DME  PT Treatment Interventions DME instruction;Balance training;Gait training;Functional mobility training;Therapeutic activities;Therapeutic exercise;Patient/family education   PT Goals (Current goals can be found in the Care Plan section) Acute Rehab PT Goals Patient Stated Goal: To get stronger PT Goal Formulation: With patient/family Time For Goal Achievement: 05/09/16 Potential to Achieve Goals: Fair    Frequency Min 3X/week   Barriers to discharge        Co-evaluation               End of Session Equipment Utilized During Treatment: Gait belt Activity Tolerance: Patient limited by  pain Patient left: in bed;with call bell/phone within reach;with family/visitor present           Time: [id number] PT Time Calculation (min) (ACUTE ONLY): 28 min   Charges:   PT Evaluation $PT Eval Moderate Complexity: 1 Procedure PT Treatments $Gait Training: 8-22 mins   PT G CodesReginia Naas 05-15-16, 2:44 PM  Magda Kiel, [place] 05/15/16

## 2016-04-29 NOTE — Procedures (Signed)
Pt seen on HD.  Ap 140 Vp 150 BFR 400.  SBP 116.  Tolerating HD well so far.

## 2016-04-29 NOTE — Progress Notes (Signed)
PROGRESS NOTE    Tony Freeman  [id number] DOB: 1951/09/17 DOA: 04/24/2016 PCP: Tanya Nones, MD    Brief Narrative:  Tony Freeman is a 65 y.o. male with with history of chronic atrial fibrillation, ischemic cardiomyopathy status post ICD placement, CAD status post stenting, diabetes mellitus type 2, ESRD on hemodialysis on Tuesday Thursday since Saturday and chronic combined systolic and diastolic heart failure presents to the ER because of increasing pain in his left lower extremity. Patient states he has been having pain in his left lower extremity over the last 2-3 days. Denies any fever or chills. Denies any trauma. In the ER patient was found to be febrile with labs showing leukocytosis and patient was hypoxic requiring BiPAP. Chest x-ray was showing airspace opacity and right-sided pleural effusion which was worsening. Patient labs also show Hyprkalemia for which patient was given calcium gluconate and sodium bicarbonate and insulin D50. On call nephrologist Dr. Lorrene Reid was consulted by ER physician and patient is to get early dialysis. Patient states he is mildly short of breath and has some nonproductive cough otherwise denies any chest pain. On exam patient's both lower extremity edematous with mild tenderness in the left lower extremity extending from the left knee to the foot. There is ulceration on the posterior aspect of his left leg. No active discharge. 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   ED Course: Was started on antibiotics and was given insulin and calcium gluconate sodium bicarbonate and D50  Patient was admitted placed empirically on IV antibiotics for presumed pneumonia and initially had to be on BiPAP secondary to hypoxia. Patient transitioned quickly off BiPAP after first hemodialysis session with improvement with  volume overload. Patient also noted to likely have left lower extremity and penis calciphylaxis and on hemodialysis and IV sodium thiosulfate as well as pain medication. Cardiology was consulted due to elevated troponins felt to be secondary to CHF. Patient also noted to have a nodular liver per CT chest as well as ascites. Patient underwent paracentesis with 6 L of fluid removed.  Assessment & Plan:   Principal Problem:   Acute respiratory failure with hypoxia (HCC) Active Problems:   Acute on chronic combined systolic and diastolic heart failure (HCC)   DM (diabetes mellitus), type 2, uncontrolled, with renal complications (HCC)   CAD (coronary artery disease)   Lower extremity edema   Atrial fibrillation (HCC)   HCAP (healthcare-associated pneumonia)   ESRD (end stage renal disease) on dialysis (HCC)   Leg pain   Hyperkalemia   Hypervolemia   Pleural effusion   Ascites   Leukocytosis   Hypoxia   Calciphylaxis   Pressure ulcer  #1 acute respiratory failure with hypoxia secondary to volume overload/ acute on chronic systolic heart failure and probable healthcare associated pneumonia/ Patient with clinical improvement. Patient status post hemodialysis 04/25/2016 with 3.5 L removed. Patient is - 6.876 L during this hospitalization. Cardiac enzymes which was cycled were mildly elevated. Patient with 2-D echo from 11/29/2015 with a EF of 15-20% with severe diffuse hypokinesis and moderate to severe pulmonary artery systolic pressure with a PA peak pressure of 65 mmHg. Patient was on BiPAP on admission currently on nasal cannula. Patient may require 4 days of hemodialysis per week per cardiology recommendations. Patient per nephrology will likely require daily hemodialysis for the next one week and then hemodialysis 4 times a week thereafter.  Continue Coreg, Lipitor. Patient has been pancultured results pending. Urine Legionella antigen and urine pneumococcus antigen pending. Continue empiric IV  cefepime. Discontinued IV vancomycin. Nephrology and cardiology following.  #2 end-stage renal disease on hemodialysis Patient with hemodialysis. Patient with - 6.876 L during this hospitalization. Patient with some clinical improvement. Continue sodium thiosulfate for calciphylaxis. Per nephrology.  #3 hyperkalemia Improved since admission. Potassium currently at 5.7. Patient asymptomatic. Status post sodium bicarbonate, D50, insulin, calcium gluconate. Patient received hemodialysis with improvement with hyperkalemia. Patient for in hemodialysis today.  #4 probable healthcare associated pneumonia Per chest x-ray. Patient also noted to be hypoxic on admission requiring BiPAP with a significant leukocytosis. WBC trending down. Urine cultures pending. Blood cultures pending. Urine Legionella antigen and urine pneumococcus antigen pending. Continue oxygen, empiric IV cefepime. Discontinued IV vancomycin.   #5 acute on chronic systolic heart failure Cardiac enzymes mildly elevated which was felt to be secondary to CHF. Patient denies any chest pain. 2-D echo from 11/29/2015 with a EF of 15-20% with severe diffuse hypokinesis and moderate to severe pulmonary artery systolic pressure with a PA peak pressure of 65 mmHg. Patient is -6.876 L during this hospitalization. Patient received hemodialysis daily since admission. No hemodialysis over the weekend as patient refused. Patient has been seen in consult by cardiology who are recommending possible four-day hemodialysis per week to keep fluid off.  Per nephrology patient will likely require hemodialysis daily for at least one week and then HD 4 times daily per week. Continue Coreg, Lipitor, Plavix. Patient has been switched from Coumadin to apixaban per nephrology. Unable to use ACE inhibitor secondary to end-stage renal disease. Patient in agreement for hemodialysis today. Cardiology following and appreciate their input and recommendations.  #6 abdominal  ascites S/p Diagnostic and therapeutic paracentesis with 6 L of fluid removed. Patient status post albumin IV. Patient with no abdominal pain. Gram stain negative. Fluid cultures pending. Fluid from paracentesis with 2003 WBCs and turbid in appearance. Patient with clinical improvement. Continue empiric IV antibiotics.  #7 paroxysmal atrial fibrillation Currently rate controlled. Continue Coreg and amiodarone. Coumadin has been changed to apixaban per nephrology due to patient's calciphylaxis. Continue Plavix. Cardiology following and appreciate input and recommendations.  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 Per nephrology patient will likely require daily dialysis for at least a week and then HD 4 times weekly thereafter. Pain management.  #9 well controlled diabetes mellitus type 2 CBGs have ranged from 144-195. Hemoglobin A1c 5.9. Sliding scale insulin.  #10 coronary artery disease/elevated troponin Elevated troponin likely secondary to CHF. Patient denies any active chest pain. Continue Coreg, Lipitor, Plavix, amiodarone. Coumadin has been changed to apixaban per nephrology secondary to calciphylaxis. Cardiology following and appreciate input and recommendations.  #11 leukocytosis Maybe secondary to probable healthcare associated pneumonia. Patient has been pancultured results pending. Urinalysis pending. WBC trending down. Patient status post paracentesis 04/25/2016 with 6 L of fluid removed. Gram stain negative. Fluid cultures pending. Cytology pending. Continue empiric IV cefepime. Discontinued IV vancomycin.  #12 right pleural effusion Patient with chronic right pleural effusion. Repeat chest x-ray 04/25/2016 with moderate partially loculated right pleural effusion, atelectasis or infiltrate in the right lower lobe. No pulmonary edema. Small left pleural effusion with left basilar  atelectasis. Repeat chest x-ray this morning with interval improvement in interstitial edema and right midlung infiltrate. Persistent pleural effusions and bibasilar atelectasis/consolidation. Case discussed with Dr. Chase Caller of critical care who reviewed the  films and feels like pleural effusion likely secondary to patient's end-stage renal disease and chronic systolic heart failure ( EF 15-20%) and at this time to continue current management with hemodialysis for volume overload. Continue empiric IV antibiotics. Pulmonary toilet. Follow.  #13 nodular contour of liver Per CT chest. Patient also noted to have ascites status post paracentesis with 6 L of fluid removed. Patient with no significant alcoholic history. Patient with no history of IV drug use. Patient however does have a systolic heart failure EF of 15-20% and liver abnormalities could be secondary to right heart failure. Acute hepatitis panel negative. Follow.  #14 prognosis Patient with multiple comorbidities including ischemic cardiomyopathy status post ICD placement with EF of 15-20%, end-stage renal disease on hemodialysis, nodular contour of liver noted on chest CT with ascites status post paracentesis with 6 L of fluid removed. Patient also with calciphylaxis. Patient with significant comorbidities and as such will consult with palliative care for goals of care meeting.   DVT prophylaxis: apixaban Code Status: Full Family Communication: Updated patient. No family at bedside. Disposition Plan: Home versus skilled nursing facility when medically stable.   Consultants:   Nephrology: Dr. Lorrene Reid 04/24/2016  Cardiology: Dr. Aundra Dubin 04/24/2016  Procedures:   Lower extremity Dopplers 04/24/2016  Abdominal ultrasound 04/24/2016  Plain films of the left tib-fib 04/24/2016  Chest x-ray 04/24/2016, 04/25/2016  CT chest 04/24/2016  Ultrasound guided Paracentesis with 6 L of tan chylous colored fluid removed  04/25/2016  Antimicrobials:   IV cefepime 04/24/2016  IV  vancomycin 04/24/2016>>>>> 04/28/2016   Subjective: Patient in hemodialysis. Patient complaining of left lower extremity pain. Patient denies shortness of breath. Patient denies any chest pain. denies any chest pain.   Objective: Filed Vitals:   04/29/16 0830 04/29/16 0900 04/29/16 0930 04/29/16 1000  BP: 135/36 115/34 118/61 125/46  Pulse: 70 90 73 74  Temp:      TempSrc:      Resp: 16 12 20 20   Height:      Weight:      SpO2:        Intake/Output Summary (Last 24 hours) at 04/29/16 1047 Last data filed at 04/28/16 1744  Gross per 24 hour  Intake    240 ml  Output   2582 ml  Net  -2342 ml   Filed Weights   04/28/16 1744 04/28/16 2258 04/29/16 0659  Weight: 75 kg (165 lb 5.5 oz) 73.8 kg (162 lb 11.2 oz) 74 kg (163 lb 2.3 oz)    Examination:  General exam: In HD. Respiratory system: Decreased breath sounds in the bases.  Respiratory effort normal. Cardiovascular system: S1 & S2 heard, RRR. No JVD, murmurs, rubs, gallops or clicks. 2 + BLE edema. Gastrointestinal system: Abdomen is Less distended, soft and nontender. No organomegaly or masses felt. Normal bowel sounds heard. Central nervous system: Alert and oriented. No focal neurological deficits. Extremities: Symmetric 5 x 5 power.LLE TTP. Skin: LLE with ulceration/necrosis on posterior aspect. Blister noted on LLE. Psychiatry: Judgement and insight appear normal. Mood & affect appropriate.     Data Reviewed: I have personally reviewed following labs and imaging studies  CBC:  Recent Labs Lab 04/24/16 0710 04/25/16 0216 04/26/16 0527 04/27/16 0623 04/28/16 0347 04/29/16 0511  WBC 25.6* 17.2* 13.5* 12.9* 13.9* 12.3*  NEUTROABS 22.1* 14.5* 11.3* 10.6*  --   --   HGB 10.5* 9.8* 10.1* 10.2* 10.0* 9.8*  HCT 32.6* 30.8* 31.6* 31.6* 30.9* 30.6*  MCV 100.0 101.3* 100.0 99.1 97.8 98.4  PLT 175 164 146* 154 146* [id number]   Basic Metabolic Panel:  Recent  Labs Lab 04/25/16 0216 04/26/16 0527 04/27/16 0623 04/28/16 0347 04/29/16 0511  NA 134* 133* 129* 130* 132*  K 4.9 4.6 5.4* 5.7* 5.1  CL 94* 95* 90* 85* 96*  CO2 24 24 22  20* 22  GLUCOSE 130* 145* 145* 159* 132*  BUN 41* 37* 59* 80* 45*  CREATININE 4.68* 4.45* 6.20* 7.40* 5.22*  CALCIUM 9.9 9.5 9.3 10.2 8.7*  MG 2.1  --   --   --   --   PHOS 5.3* 5.5* 6.6* 7.7* 6.0*   GFR: Estimated Creatinine Clearance: 13.1 mL/min (by C-G formula based on Cr of 5.22). Liver Function Tests:  Recent Labs Lab 04/24/16 0710  04/25/16 1000 04/26/16 0527 04/27/16 0623 04/28/16 0347 04/29/16 0511  AST 25  --  26  --  23  --   --   ALT 20  --  19  --  17  --   --   ALKPHOS 145*  --  137*  --  151*  --   --   BILITOT 1.8*  --  1.7*  --  1.3*  --   --   PROT 7.2  --  7.6  --  6.6  --   --   ALBUMIN 3.0*  < > 2.9* 2.7* 2.5*  2.5* 2.5* 2.5*  < > = values in this interval not displayed. No results for input(s): LIPASE, AMYLASE in the last 168 hours. No results for input(s): AMMONIA in the last 168 hours. Coagulation Profile:  Recent Labs Lab 04/24/16 0710  INR 1.88*   Cardiac Enzymes:  Recent Labs Lab 04/24/16 0141 04/24/16 0710 04/24/16 1353 04/24/16 1822  TROPONINI 0.28* 0.34* 0.39* 0.40*   BNP (last 3 results) No results for input(s): PROBNP in the last 8760 hours. HbA1C:  Recent Labs  04/27/16 0623  HGBA1C 5.9*   CBG:  Recent Labs Lab 04/27/16 2040 04/28/16 0731 04/28/16 1204 04/28/16 1845 04/28/16 2255  GLUCAP 155* 147* 148* 144* 195*   Lipid Profile: No results for input(s): CHOL, HDL, LDLCALC, TRIG, CHOLHDL, LDLDIRECT in the last 72 hours. Thyroid Function Tests: No results for input(s): TSH, T4TOTAL, FREET4, T3FREE, THYROIDAB in the last 72 hours. Anemia Panel: No results for input(s): VITAMINB12, FOLATE, FERRITIN, TIBC, IRON, RETICCTPCT in the last 72 hours. Sepsis Labs:  Recent Labs Lab 04/24/16 0154 04/24/16 0710 04/24/16 1353  PROCALCITON  --   5.81  --   LATICACIDVEN 2.86* 1.4 1.3    Recent Results (from the past 240 hour(s))  Culture, blood (Routine X 2) w Reflex to ID Panel     Status: None (Preliminary result)   Collection Time: 04/24/16  4:20 AM  Result Value Ref Range Status   Specimen Description BLOOD LEFT ARM  Final   Special Requests AEROBIC BOTTLE ONLY 5ML  Final   Culture NO GROWTH 4 DAYS  Final   Report Status PENDING  Incomplete  Culture, blood (Routine X 2) w Reflex to ID Panel     Status: None (Preliminary result)   Collection Time: 04/24/16  4:25 AM  Result Value Ref Range Status   Specimen Description BLOOD LEFT HAND  Final   Special Requests AEROBIC BOTTLE ONLY 5ML  Final   Culture NO GROWTH 4 DAYS  Final   Report Status PENDING  Incomplete  MRSA PCR Screening     Status: None   Collection Time: 04/24/16  5:05 AM  Result Value  Ref Range Status   MRSA by PCR NEGATIVE NEGATIVE Final    Comment:        The GeneXpert MRSA Assay (FDA approved for NASAL specimens only), is one component of a comprehensive MRSA colonization surveillance program. It is not intended to diagnose MRSA infection nor to guide or monitor treatment for MRSA infections.   Gram stain     Status: None   Collection Time: 04/25/16  4:24 PM  Result Value Ref Range Status   Specimen Description PERITONEAL FLUID  Final   Special Requests NONE  Final   Gram Stain   Final    RARE WBC PRESENT, PREDOMINANTLY MONONUCLEAR NO ORGANISMS SEEN    Report Status 04/25/2016 FINAL  Final  Culture, body fluid-bottle     Status: None (Preliminary result)   Collection Time: 04/25/16  4:24 PM  Result Value Ref Range Status   Specimen Description FLUID PERITONEAL  Final   Special Requests BOTTLES DRAWN AEROBIC AND ANAEROBIC 10CC  Final   Culture NO GROWTH 3 DAYS  Final   Report Status PENDING  Incomplete         Radiology Studies: No results found.      Scheduled Meds: . amiodarone  100 mg Oral Daily  . apixaban  5 mg Oral BID  .  atorvastatin  40 mg Oral q1800  . carvedilol  3.125 mg Oral BID WC  . ceFEPime (MAXIPIME) IV  1 g Intravenous Once  . [START ON 05/01/2016] ceFEPime (MAXIPIME) IV  2 g Intravenous Q T,Th,Sat-1800  . clobetasol ointment  1 application Topical BID  . clopidogrel  75 mg Oral Daily  . gabapentin  300 mg Oral QHS  . heparin  2,600 Units Dialysis Once in dialysis  . insulin aspart  0-9 Units Subcutaneous TID WC  . multivitamin with minerals  1 tablet Oral Daily  . oxyCODONE-acetaminophen      . polyethylene glycol  17 g Oral Daily  . silver sulfADIAZINE  1 application Topical BID  . sodium chloride flush  3 mL Intravenous Q12H  . sodium thiosulfate infusion for calciphylaxis  25 g Intravenous Q T,Th,Sa-HD  . sucroferric oxyhydroxide  1,000 mg Oral TID WC   Continuous Infusions:    LOS: 5 days    Time spent: 14 mins    Kamren Heskett, MD [facility] Pager [phone]  If 7PM-7AM, please contact night-coverage [email or web address] Password [email or web address] 04/29/2016, 10:47 AM

## 2016-04-29 NOTE — Progress Notes (Signed)
Patient has an open wound on his penis.  He said it is sensitive and painfulmnm        l

## 2016-04-29 NOTE — Progress Notes (Signed)
Pharmacy Antibiotic Note  Tony Freeman is a 65 y.o. male with ESRD on HD admitted on 04/24/2016  LLE pain and SOB.  Pharmacy has been consulted for cefepime dosing for pneumonia.  Patient's HD schedule is normally TTS. He has required additional HD sessions this admission and will get another session of HD today. Will likely require daily HD for the rest of the week and then HD 4x/week thereafter.   Plan: - Cefepime 1g IV x1 today after HD - F/u HD schedule and need for adjustment of further cefepime doses  - Monitor C&S, CBC and length of therapy  Height: 5\' 6"  (167.6 cm) Weight: 163 lb 2.3 oz (74 kg) IBW/kg (Calculated) : 63.8  Temp (24hrs), Avg:98.4 F (36.9 C), Min:97.5 F (36.4 C), Max:98.8 F (37.1 C)   Recent Labs Lab 04/24/16 0154 04/24/16 0710 04/24/16 1353 04/25/16 0216 04/26/16 0527 04/27/16 0623 04/28/16 0347 04/29/16 0511  WBC  --  25.6*  --  17.2* 13.5* 12.9* 13.9* 12.3*  CREATININE  --  7.02*  --  4.68* 4.45* 6.20* 7.40* 5.22*  LATICACIDVEN 2.86* 1.4 1.3  --   --   --   --   --     Estimated Creatinine Clearance: 13.1 mL/min (by C-G formula based on Cr of 5.22).    No Known Allergies  Antimicrobials this admission: Vanc 6/29 >> 7/3  Cefepime 6/29 >>   Microbiology results: 6/29 BCx >> ngtd  6/29 UCx >> ngtd  6/29 MRSA PCR >> neg  6/30 Peritoneal fluid >> no organisms seen  Thank you for allowing pharmacy to be a part of this patient's care.  Dimitri Ped, PharmD. PGY-1 Pharmacy Resident Pager: [phone] 04/29/2016 9:59 AM

## 2016-04-29 NOTE — Evaluation (Signed)
Occupational Therapy Evaluation Patient Details Name: Tony Freeman MRN: [id number] DOB: [birth year] Today's Date: 04/29/2016    History of Present Illness Tony Freeman is a 65 y.o. male with with history of chronic atrial fibrillation, ischemic cardiomyopathy status post ICD placement, CAD status post stenting, diabetes mellitus type 2, ESRD on hemodialysis on Tuesday Thursday since Saturday and chronic combined systolic and diastolic heart failure.  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    Clinical Impression   This 65 yo male admitted with above presents to acute OT with deficits below (see OT problem list ) thus affecting his PLOF. He will benefit from acute OT with follow up OT at SNF to work back towards PLOF.    Follow Up Recommendations  SNF;Supervision/Assistance - 24 hour;Other (comment) (family prefers [facility])          Precautions / Restrictions Precautions Precautions: Fall Restrictions Weight Bearing Restrictions: No      Mobility Bed Mobility Overal bed mobility: Needs Assistance Bed Mobility: Supine to Sit;Sit to Supine     Supine to sit: Mod assist Sit to supine: Mod assist;+2 for physical assistance   General bed mobility comments: son assisted to get pt into bed with trunk and spinning on pad; therapist assist with legs into bed  Transfers Overall transfer level: Needs assistance Equipment used: Rolling walker (2 wheeled) Transfers: Sit to/from Stand Sit to Stand: Min assist            Balance Overall balance assessment: Needs assistance Sitting-balance support: Bilateral upper extremity supported;Feet supported Sitting balance-Leahy Scale: Good Sitting balance - Comments: sitting EOB no UE support   Standing balance support: Bilateral upper extremity  supported Standing balance-Leahy Scale: Poor Standing balance comment: heavy reliance on RW                            ADL Overall ADL's : Needs assistance/impaired Eating/Feeding: Set up;Supervision/ safety;Bed level   Grooming: Set up;Supervision/safety;Bed level   Upper Body Bathing: Set up;Supervision/ safety;Bed level   Lower Body Bathing: Maximal assistance (min A sit<>stand)   Upper Body Dressing : Minimal assistance;Sitting Upper Body Dressing Details (indicate cue type and reason): with min a with sitting Lower Body Dressing: Total assistance (min A sit<>stand)                                 Pertinent Vitals/Pain Pain Assessment: 0-10 Pain Score: 8  Pain Location: LLE Pain Descriptors / Indicators: Sore;Aching Pain Intervention(s): Monitored during session;Repositioned;RN gave pain meds during session     Hand Dominance Right   Extremity/Trunk Assessment Upper Extremity Assessment Upper Extremity Assessment: Generalized weakness   Lower Extremity Assessment Lower Extremity Assessment: RLE deficits/detail;LLE deficits/detail RLE Deficits / Details: AROM grossly WFL, strength hip flexion 3/5, knee extension 4-/5 RLE Sensation: history of peripheral neuropathy LLE Deficits / Details: AROM grossly WFL, strength hip flexion 3/5, knee extension 4-/5; noted multiple wounds covered with pink bandages LLE Sensation: history of peripheral neuropathy       Communication Communication Communication: No difficulties   Cognition Arousal/Alertness: Awake/alert Behavior During Therapy: WFL for tasks assessed/performed Overall Cognitive Status: Impaired/Different from baseline Area of Impairment: Safety/judgement         Safety/Judgement: Decreased awareness of safety;Decreased awareness of  deficits (pt sat down on bed without warning)   Problem Solving: Slow processing;Decreased initiation;Requires verbal cues;Requires tactile cues                 Home Living Family/patient expects to be discharged to:: [place]: Children;Other relatives Available Help at Discharge: Family;Available 24 hours/day Type of Home: Apartment Home Access: Level entry     Home Layout: One level     Bathroom Shower/Tub: Tub/shower unit Shower/tub characteristics: Curtain Biochemist, clinical: Standard Bathroom Accessibility: No   Home Equipment: Environmental consultant - 2 wheels;Shower seat;Grab bars - tub/shower;Hand held shower head   Additional Comments: has been sponge bathing due to port      Prior Functioning/Environment Level of Independence: Needs assistance  Gait / Transfers Assistance Needed: was not using assistive device for walking at home, but limited endurance due to "his heart" per son ADL's / Homemaking Assistance Needed: son assists with showering--pt can do UB except for back; pt can do most of dressing except for socks and shoes        OT Diagnosis: Generalized weakness;Acute pain;Cognitive deficits   OT Problem List: Decreased strength;Decreased activity tolerance;Impaired balance (sitting and/or standing);Decreased range of motion;Pain;Decreased cognition   OT Treatment/Interventions: Self-care/ADL training;Patient/family education;Balance training;Therapeutic activities;DME and/or AE instruction;Cognitive remediation/compensation    OT Goals(Current goals can be found in the care plan section) Acute Rehab OT Goals Patient Stated Goal: to rehab then home (son) OT Goal Formulation: With patient/family Time For Goal Achievement: 05/13/16 Potential to Achieve Goals: Good  OT Frequency: Min 2X/week              End of Session Equipment Utilized During Treatment: Gait belt  Activity Tolerance: Patient limited by pain Patient left: in bed;with call bell/phone within reach;with bed alarm set;with nursing/sitter in room;with family/visitor present   Time: [id number] OT Time Calculation (min): 19  min Charges:  OT General Charges $OT Visit: 1 Procedure OT Evaluation $OT Eval Moderate Complexity: 1 Procedure  Tony Freeman [id number] 04/29/2016, 2:57 PM

## 2016-04-29 NOTE — Consult Note (Signed)
Consultation Note Date: 04/29/2016   Patient Name: Tony Freeman  DOB: October 26, 1951  MRN: [id number]  Age / Sex: 65 y.o., male  PCP: Tanya Nones, MD Referring Physician: Eugenie Filler, MD  Reason for Consultation: Establishing goals of care  HPI/Patient Profile: 65 y.o. male  with past medical history of ESRD, mixed heart failure with an EF of 15 - 20%, cardiac arrest s/p AICD placement, aflutter, dementia, who was admitted on 04/24/2016 with worsening LLE pain and acute respiratory failure secondary to volume overload and possible pneumonia. The leg pain is felt to be due to calciphylaxis ulcerations.  Since admission he has received daily HD to remove volume and excess calcium.  He also received thiosulfate in HD to reduce the calcium in his blood. Due to ascites he underwent paracentesis and 6L was removed.  Clinical Assessment and Goals of Care: I met with the patient and his family at bedside.  The patient listened but opted to let his son, [name]  Ask and answer questions for him.  [name] appears to have a fairly good understanding of the patient's heart and kidney failure.  He understands the painful black marks on the patients legs are calciphylaxis.  We discussed that they are also an indicator of poor prognosis.  We discussed that the fluid reaccumulation would be an ongoing problem to manage.  [name] is his father's primary care taker - and he appears to do an excellent job.  He wants his father to build strength and walk so he can come home with him.  We discussed rehab.  [name] would like CIR if his father is eligible.  With regards to code status the patient and family elect to remain full code for now.  [name] tried to engage his father in a "living will" type of conversation (i.e. Would you want to be on life support if you were unconscious and not going to wake up?) His father  chose not to respond.  [name] requested HCPOA and Living Will paper work.  We discussed residential hospice ([place]) for the very end of life, and that with proper planning his father would likely have a peaceful comfortable passing.  I encouraged [name] and [name] to visit [employer] before it was needed.  His father continued to complain of pain in his leg - we discussed the possibility of a fentanyl pain patch.   I promised to discuss that with Dr. Grandville Silos.  Primary decision maker:  Son, [name].    SUMMARY OF RECOMMENDATIONS   Full Code CIR Eval Consider fentanyl pain patch (12.5 mcg) as it is the safest narcotic to use with renal failure and is less likely to lower BP. Will provide Advanced Directive paperwork & facilitate that conversation. "Hard Choices" book left with [name].   Symptom Management:   Consider fentanyl patch 12.5 mcg  Will add fentanyl  12.5 mcg IV PRN to current meds  Prognosis:   Likely 6-12 months given ESRD, ESHF, calciphylaxis.  Discharge Planning: Outpatient Palliative Follow up either  at home or at [facility].  Please add this into D/C summary.     Primary Diagnoses: Present on Admission:  . Acute respiratory failure with hypoxia ([place]) . Leg pain . CAD (coronary artery disease) . Atrial fibrillation ([place]) . HCAP (healthcare-associated pneumonia) . Hyperkalemia . Hypervolemia . Pleural effusion . Ascites . DM (diabetes mellitus), type 2, uncontrolled, with renal complications ([place]) . Lower extremity edema . Leukocytosis . Acute on chronic combined systolic and diastolic heart failure ([place]) . Calciphylaxis  I have reviewed the medical record, interviewed the patient and family, and examined the patient. The following aspects are pertinent.  Past Medical History  Diagnosis Date  . Systolic and diastolic CHF, chronic ([place])     a. Echo at [facility] 11/17/2013 Ef <25%, severe hypokinesis of LV, moderately dilated LA, grade IV/IV  diastolic dysfunction with irreversible restrictive pattern of mitral inflow, severe pulm HTN (RVSP >53mHg), 1-2+ MR  [facility] EF 25-30%, moderate concentric LVH, diffuse hypokinesis, moderately dilated LA, no significant MR observed  . CAD (coronary artery disease)     a. PCI LAD/RCA in 2007  [facility] c. PCI LAD 08/2013, CTO of RCA and nonobstructive disease in the LCx.  .Marland KitchenHyperlipidemia   . Dementia   . Ischemic cardiomyopathy     a. s/p Biotronik ICD 06/2014.  .Marland KitchenRetinopathy     bilateral  . HTN (hypertension)   . Abnormal renal ultrasound     a. 03/2014: Abnormal renal ultrasound with left renal lesion and bladder wall thickening and microscopic hematuria. Multiple cysts on prior MRI. Given inability to use contrast, f/u imaging limited, repeat [place] with outpatient uro f/u.  .Marland KitchenParoxysmal atrial fibrillation (HCC)   . Paroxysmal atrial flutter ([place]   . Anemia   . Myocardial infarction ([facility] 2016    Heart attack  . Peripheral vascular disease ([place]   . Stroke ([facility]     right arm weakness  . Asthma   . CKD (chronic kidney disease), stage IV ([place]     on dialysis T/TH/Sa  . Diabetes mellitus with nephropathy ([place]     type 2  . H/O cardiac arrest 04/2015  . Constipation   . AICD (automatic cardioverter/defibrillator) present   . ESRD (end stage renal disease) on dialysis ([place]   . Pneumonia     when he was younger, March 2017   Social History   Social History  . Marital Status: Single    Spouse Name: N/A  . Number of Children: N/A  . Years of Education: N/A   Social History Main Topics  . Smoking status: Never Smoker   . Smokeless tobacco: Never Used  . Alcohol Use: No  . Drug Use: No  . Sexual Activity: Not Asked   Other Topics Concern  . None   Social History Narrative   Going to be living with his son.  He lives with his daughter currently.  2 children.  Disability since CVA   Family History  Problem  Relation Age of Onset  . Heart disease      brother  . Kidney disease    . Diabetes Mother     Bilateral Leg  . Heart disease Mother   . Cancer Father     Right neck  . Diabetes Sister   . Hyperlipidemia Sister   . Hypertension Sister   . Heart disease Sister   . Diabetes Brother   .  Hyperlipidemia Brother   . Hypertension Brother   . Heart attack Brother   . Heart disease Brother    Scheduled Meds: . amiodarone  100 mg Oral Daily  . apixaban  5 mg Oral BID  . atorvastatin  40 mg Oral q1800  . carvedilol  3.125 mg Oral BID WC  . ceFEPime (MAXIPIME) IV  1 g Intravenous Once  . [START ON 05/01/2016] ceFEPime (MAXIPIME) IV  2 g Intravenous Q T,Th,Sat-1800  . clobetasol ointment  1 application Topical BID  . clopidogrel  75 mg Oral Daily  . gabapentin  300 mg Oral QHS  . heparin  2,600 Units Dialysis Once in dialysis  . insulin aspart  0-9 Units Subcutaneous TID WC  . multivitamin with minerals  1 tablet Oral Daily  . polyethylene glycol  17 g Oral Daily  . silver sulfADIAZINE  1 application Topical BID  . sodium chloride flush  3 mL Intravenous Q12H  . sodium thiosulfate infusion for calciphylaxis  25 g Intravenous Q T,Th,Sa-HD  . sucroferric oxyhydroxide  1,000 mg Oral TID WC   Continuous Infusions:  PRN Meds:.sodium chloride, sodium chloride, acetaminophen **OR** acetaminophen, albuterol, alteplase, camphor-menthol **AND** hydrOXYzine, docusate sodium, docusate sodium, feeding supplement (NEPRO CARB STEADY), heparin, lidocaine (PF), lidocaine-prilocaine, mometasone-formoterol, nitroGLYCERIN, ondansetron **OR** ondansetron (ZOFRAN) IV, oxyCODONE-acetaminophen, pentafluoroprop-tetrafluoroeth, sorbitol, traMADol, zolpidem  No Known Allergies Review of Systems :  Patient complaining of fluid accumulation, fatigue and particularly leg pain. Denies constipation, dysphagia, SOB  Physical Exam  Thin frail appearing male with slow speech.  Family at bedside. CV rrr resp  nad Abdomen soft nt nd Extremities able to move all 4, minimal ascites Skin on LLE anterior and posterior blisters, areas of blacken skin.  Bandages clean and dry.  Vital Signs: BP 123/42 mmHg  Pulse 77  Temp(Src) 98 F (36.7 C) (Oral)  Resp 20  Ht 5' 6"  (1.676 m)  Wt 72 kg (158 lb 11.7 oz)  BMI 25.63 kg/m2  SpO2 96% Pain Assessment: 0-10   Pain Score: 5    SpO2: SpO2: 96 % O2 Device:SpO2: 96 % O2 Flow Rate: .O2 Flow Rate (L/min): 2 L/min  IO: Intake/output summary:  Intake/Output Summary (Last 24 hours) at 04/29/16 1340 Last data filed at 04/29/16 1110  Gross per 24 hour  Intake      0 ml  Output   4582 ml  Net  -4582 ml    LBM: Last BM Date: 04/28/16 Baseline Weight: Weight: 85.276 kg (188 lb) Most recent weight: Weight: 72 kg (158 lb 11.7 oz)     Palliative Assessment/Data:     Time In: 3:30 Time Out: 4:45 Time Total: 75 min. Greater than 50%  of this time was spent counseling and coordinating care related to the above assessment and plan.  Signed by: Imogene Burn, PA-C Palliative Medicine Pager: [phone]  Please contact Palliative Medicine Team phone at [phone] for questions and concerns.  For individual provider: See Shea Evans

## 2016-04-30 DIAGNOSIS — M79605 Pain in left leg: Secondary | ICD-10-CM | POA: Insufficient documentation

## 2016-04-30 DIAGNOSIS — I481 Persistent atrial fibrillation: Secondary | ICD-10-CM

## 2016-04-30 DIAGNOSIS — R188 Other ascites: Secondary | ICD-10-CM

## 2016-04-30 LAB — RENAL FUNCTION PANEL
ALBUMIN: 2.3 g/dL — AB (ref 3.5–5.0)
Anion gap: 12 (ref 5–15)
BUN: 37 mg/dL — AB (ref 6–20)
CO2: 25 mmol/L (ref 22–32)
CREATININE: 4.35 mg/dL — AB (ref 0.61–1.24)
Calcium: 9 mg/dL (ref 8.9–10.3)
Chloride: 94 mmol/L — ABNORMAL LOW (ref 101–111)
GFR calc Af Amer: 15 mL/min — ABNORMAL LOW (ref >60)
GFR, EST NON AFRICAN AMERICAN: 13 mL/min — AB (ref >60)
Glucose, Bld: 170 mg/dL — ABNORMAL HIGH (ref 65–99)
PHOSPHORUS: 5.4 mg/dL — AB (ref 2.5–4.6)
POTASSIUM: 4.8 mmol/L (ref 3.5–5.1)
Sodium: 131 mmol/L — ABNORMAL LOW (ref 135–145)

## 2016-04-30 LAB — CBC
HEMATOCRIT: 33.1 % — AB (ref 39.0–52.0)
Hemoglobin: 10.4 g/dL — ABNORMAL LOW (ref 13.0–17.0)
MCH: 32.4 pg (ref 26.0–34.0)
MCHC: 31.4 g/dL (ref 30.0–36.0)
MCV: 103.1 fL — ABNORMAL HIGH (ref 78.0–100.0)
PLATELETS: 132 10*3/uL — AB (ref 150–400)
RBC: 3.21 MIL/uL — ABNORMAL LOW (ref 4.22–5.81)
RDW: 16.5 % — AB (ref 11.5–15.5)
WBC: 13.8 10*3/uL — AB (ref 4.0–10.5)

## 2016-04-30 LAB — CULTURE, BODY FLUID-BOTTLE: CULTURE: NO GROWTH

## 2016-04-30 LAB — CULTURE, BODY FLUID W GRAM STAIN -BOTTLE

## 2016-04-30 LAB — HEPATITIS PANEL, ACUTE
HCV Ab: 0.1 s/co ratio (ref 0.0–0.9)
HEP B S AG: NEGATIVE
Hep A IgM: NEGATIVE
Hep B C IgM: NEGATIVE

## 2016-04-30 LAB — GLUCOSE, CAPILLARY
GLUCOSE-CAPILLARY: 175 mg/dL — AB (ref 65–99)
GLUCOSE-CAPILLARY: 137 mg/dL — AB (ref 65–99)
GLUCOSE-CAPILLARY: 141 mg/dL — AB (ref 65–99)
Glucose-Capillary: 143 mg/dL — ABNORMAL HIGH (ref 65–99)

## 2016-04-30 NOTE — Progress Notes (Signed)
Patient ID: Tony Freeman, male   DOB: 09-Sep-1951, 65 y.o.   MRN: [id number]  PROGRESS NOTE    Lawarnce Chugh Bagshaw  [id number] DOB: [birth year] DOA: 04/24/2016  PCP: Tanya Nones, MD   Brief Narrative:  65 y.o. male with with history of chronic atrial fibrillation (wa previously on [facility] with coumadin), ischemic cardiomyopathy status post ICD placement, CAD status post stenting, diabetes mellitus type 2, ESRD on hemodialysis on Tuesday, Thursday and Saturday and chronic combined systolic and diastolic heart failure who presented to [facility] ED with increasing pain in his left lower extremity for past few days prior to this admission.  In the ER, patient was found to be febrile, hypoxic and has required BiPAP. Chest x-ray showed airspace opacity and worsening right-sided pleural effusion. Blood work was notable for hyprkalemia forwhich he was given calcium gluconate, sodium bicarbonate and insulin D50. odium bicarbonate and D50.  Patient was admitted for presumed pneumonia and started on empiric antibiotics. Patient transitioned quickly off BiPAP after first hemodialysis session with improvement with volume overload. Patient also noted to likely have left lower extremity and penis calciphylaxis and he was started on IV sodium thiosulfate as well as pain medication. Cardiology was consulted due to elevated troponins felt to be secondary to CHF.   Patient additionally noted to have a nodular liver per CT chest as well as ascites and he underwent paracentesis with 6 L of fluid removed.  Assessment & Plan:   Acute respiratory failure with hypoxia secondary to volume overload / probable healthcare associated pneumonia / Leukocytosis  - Volume statu improved - 2-D echo from 11/29/2015 with a EF of 15-20% with severe diffuse hypokinesis and moderate to severe pulmonary artery systolic pressure with a PA peak pressure of 65 mmHg.  - Patient was on BiPAP on admission but currently on nasal  cannula.  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  - The goal uncorrected calcium for pt 7.0- 8.0 - Continue Na thiosulfate   Hyperkalemia - In the setting of ESRD - Status post sodium bicarbonate, D50, insulin, calcium gluconate. Also on HD - Potassium WNL today   Acute on chronic systolic heart failure - 2-D echo from 11/29/2015 with a EF of 15-20% with severe diffuse hypokinesis and moderate to severe pulmonary artery systolic pressure with a PA peak pressure of 65 mmHg.  - Stable resp status - Weight 74 kg --> 72 kg --> 75.5 kg since 7/4 - Cardiology following - Continue coreg - Nephrology changed coumadin to apixaban  Paroxysmal atrial fibrillation - CHADS vasc score 3 - On AC with apixaban - HR controlled with coreg and amiodarone   Well controlled diabetes mellitus type 2 with diabetic nephropathy and neuropathy on long term insulin  - Hemoglobin A1c 5.9.  - On Sliding scale insulin. - CBG"s in past 24 hours: 176, 197, 175  Dyslipidemia associated with type 2 DM -Continue atorvastatin 40 mg at bedtime   Coronary artery disease/elevated troponin - Elevated troponin likely secondary to  CHF.  - Continue apixaban, plavix - Continue statin therapy - No chest pain at this time  Right pleural effusion - Patient with chronic right pleural effusion. - Case discussed with Dr. Chase Caller of critical care who reviewed the films, per CCM pleural effusion likely secondary to patient's end-stage renal disease and chronic systolic heart failure  ( EF 15-20%) and at this time to continue current management with hemodialysis for volume overload.  Nodular contour of liver /Ascites - Status post paracentesis with 6 L of fluid removed 04/26/2016    DVT prophylaxis: on full dose AC with apixaban  Code Status: full code  Family Communication: no family at the bedside this am Disposition Plan: to SNF once cleared by nephrology   Consultants:   Nephrology: Dr. Lorrene Reid 04/24/2016  Cardiology: Dr. Aundra Dubin 04/24/2016  Palliative care  Procedures:   Lower extremity Dopplers 04/24/2016  Ultrasound guided Paracentesis with 6 L of tan chylous colored fluid removed 04/25/2016  Antimicrobials:   IV cefepime 04/24/2016  IV vancomycin 04/24/2016>>>>> 04/28/2016    Subjective: No overnight events.   Objective: Filed Vitals:   04/29/16 1702 04/29/16 2012 04/30/16 0424 04/30/16 0753  BP: 124/50 119/32 115/54 115/43  Pulse: 76 69 73 69  Temp: 98.6 F (37 C) 97.6 F (36.4 C) 98.5 F (36.9 C) 98.7 F (37.1 C)  TempSrc: Oral Oral Oral Oral  Resp: 18 18 17 18   Height:      Weight:  75.5 kg (166 lb 7.2 oz)    SpO2: 98% 94% 92% 91%    Intake/Output Summary (Last 24 hours) at 04/30/16 1105 Last data filed at 04/30/16 1006  Gross per 24 hour  Intake    720 ml  Output   2000 ml  Net  -1280 ml   Filed Weights   04/29/16 0659 04/29/16 1110 04/29/16 2012  Weight: 74 kg (163 lb 2.3 oz) 72 kg (158 lb 11.7 oz) 75.5 kg (166 lb 7.2 oz)    Examination:  General exam: Appears calm and comfortable, wakes up when called his name, slow mentation  Respiratory system: Clear to auscultation. Respiratory effort normal. Cardiovascular system: S1 & S2 heard, Rate controlled  Gastrointestinal system: Abdomen is nondistended, soft and nontender. (+) BS Central nervous system:  No focal neurological deficits. Extremities: Pulses palpable  Skin: LLE ulceration, palpable pulses Psychiatry: Mood & affect appropriate.   Data Reviewed: I  have personally reviewed following labs and imaging studies  CBC:  Recent Labs Lab 04/24/16 0710 04/25/16 0216 04/26/16 0527 04/27/16 [id number] 04/28/16 0347 04/29/16 0511 04/30/16 0609  WBC 25.6* 17.2* 13.5* 12.9* 13.9* 12.3* 13.8*  NEUTROABS 22.1* 14.5* 11.3* 10.6*  --   --   --   HGB 10.5* 9.8* 10.1* 10.2* 10.0* 9.8* 10.4*  HCT 32.6* 30.8* 31.6* 31.6* 30.9* 30.6* 33.1*  MCV 100.0 101.3* 100.0 99.1 97.8 98.4 103.1*  PLT 175 164 146* 154 146* 156 [id number]*   Basic Metabolic Panel:  Recent Labs Lab 04/25/16 0216 04/26/16 0527 04/27/16 0623 04/28/16 0347 04/29/16 0511 04/30/16 0619  NA 134* 133* 129* 130* 132* 131*  K 4.9 4.6 5.4* 5.7* 5.1 4.8  CL 94* 95* 90* 85* 96* 94*  CO2 24 24 22  20* 22 25  GLUCOSE 130* 145* 145* 159* 132* 170*  BUN 41* 37* 59* 80* 45* 37*  CREATININE 4.68* 4.45* 6.20* 7.40* 5.22* 4.35*  CALCIUM 9.9 9.5 9.3 10.2 8.7* 9.0  MG 2.1  --   --   --   --   --  PHOS 5.3* 5.5* 6.6* 7.7* 6.0* 5.4*   GFR: Estimated Creatinine Clearance: 15.7 mL/min (by C-G formula based on Cr of 4.35). Liver Function Tests:  Recent Labs Lab 04/24/16 0710  04/25/16 1000 04/26/16 0527 04/27/16 0623 04/28/16 0347 04/29/16 0511 04/30/16 0619  AST 25  --  26  --  23  --   --   --   ALT 20  --  19  --  17  --   --   --   ALKPHOS 145*  --  137*  --  151*  --   --   --   BILITOT 1.8*  --  1.7*  --  1.3*  --   --   --   PROT 7.2  --  7.6  --  6.6  --   --   --   ALBUMIN 3.0*  < > 2.9* 2.7* 2.5*  2.5* 2.5* 2.5* 2.3*  < > = values in this interval not displayed. No results for input(s): LIPASE, AMYLASE in the last 168 hours. No results for input(s): AMMONIA in the last 168 hours. Coagulation Profile:  Recent Labs Lab 04/24/16 0710  INR 1.88*   Cardiac Enzymes:  Recent Labs Lab 04/24/16 0141 04/24/16 0710 04/24/16 1353 04/24/16 1822  TROPONINI 0.28* 0.34* 0.39* 0.40*   BNP (last 3 results) No results for input(s): PROBNP in the last 8760 hours. HbA1C: No  results for input(s): HGBA1C in the last 72 hours. CBG:  Recent Labs Lab 04/28/16 2255 04/29/16 1150 04/29/16 1629 04/29/16 2010 04/30/16 0750  GLUCAP 195* 96 176* 197* 175*   Lipid Profile: No results for input(s): CHOL, HDL, LDLCALC, TRIG, CHOLHDL, LDLDIRECT in the last 72 hours. Thyroid Function Tests: No results for input(s): TSH, T4TOTAL, FREET4, T3FREE, THYROIDAB in the last 72 hours. Anemia Panel: No results for input(s): VITAMINB12, FOLATE, FERRITIN, TIBC, IRON, RETICCTPCT in the last 72 hours. Urine analysis:    Component Value Date/Time   COLORURINE RED* 01/11/2016 2232   APPEARANCEUR CLOUDY* 01/11/2016 2232   LABSPEC 1.026 01/11/2016 2232   PHURINE 5.0 01/11/2016 2232   GLUCOSEU NEGATIVE 01/11/2016 2232   HGBUR MODERATE* 01/11/2016 2232   BILIRUBINUR MODERATE* 01/11/2016 2232   KETONESUR 15* 01/11/2016 2232   PROTEINUR >300* 01/11/2016 2232   UROBILINOGEN 1.0 07/16/2014 1600   NITRITE POSITIVE* 01/11/2016 2232   LEUKOCYTESUR SMALL* 01/11/2016 2232   Sepsis Labs: @LABRCNTIP (procalcitonin:4,lacticidven:4)   Culture, blood (Routine X 2) w Reflex to ID Panel     Status: None   Collection Time: 04/24/16  4:20 AM  Result Value Ref Range Status   Specimen Description BLOOD LEFT ARM  Final   Special Requests AEROBIC BOTTLE ONLY 5ML  Final   Culture NO GROWTH 5 DAYS  Final   Report Status 04/29/2016 FINAL  Final  Culture, blood (Routine X 2) w Reflex to ID Panel     Status: None   Collection Time: 04/24/16  4:25 AM  Result Value Ref Range Status   Specimen Description BLOOD LEFT HAND  Final   Special Requests AEROBIC BOTTLE ONLY 5ML  Final   Culture NO GROWTH 5 DAYS  Final   Report Status 04/29/2016 FINAL  Final  MRSA PCR Screening     Status: None   Collection Time: 04/24/16  5:05 AM  Result Value Ref Range Status   MRSA by PCR NEGATIVE NEGATIVE Final  Gram stain     Status: None   Collection Time: 04/25/16  4:24 PM  Result Value Ref Range  Status    Specimen Description PERITONEAL FLUID  Final   Special Requests NONE  Final   Gram Stain   Final    RARE WBC PRESENT, PREDOMINANTLY MONONUCLEAR NO ORGANISMS SEEN    Report Status 04/25/2016 FINAL  Final  Culture, body fluid-bottle     Status: None (Preliminary result)   Collection Time: 04/25/16  4:24 PM  Result Value Ref Range Status   Specimen Description FLUID PERITONEAL  Final   Special Requests BOTTLES DRAWN AEROBIC AND ANAEROBIC 10CC  Final   Culture NO GROWTH 4 DAYS  Final   Report Status PENDING  Incomplete      Radiology Studies: Dg Chest 2 View 04/27/2016   1. Interval improvement in interstitial edema and right midlung infiltrate 2. Persistent pleural effusions and bibasilar atelectasis/consolidation. 3.  Aortic Atherosclerosis   Dg Chest 2 View 04/24/2016   Mildly worsened central and basilar right lung opacities. This could represent asymmetric alveolar edema or infectious infiltrate. Right pleural effusion is slightly enlarged from 04/17/2016.   Dg Chest 2 View 04/17/2016 1. Dual-lumen right IJ catheter in stable position . 2. Cardiac pacer in stable position. Coronary artery disease. Cardiomegaly with bibasilar atelectasis and/or infiltrates and bilateral pleural effusions unchanged. Electronically Signed   By: Marcello Moores  Register   On: 04/17/2016 09:54   Ct Chest Wo Contrast 04/24/2016 1. Small to moderate right pleural effusion. 2. Small left pleural effusion. 3. Bibasilar atelectasis. 4. Coronary artery disease and mild cardiomegaly. 5. Catheters and pacemaker as described. 6. Abdominal ascites and probable cirrhosis. 7. Cholelithiasis. Electronically Signed   By: Nolon Nations M.D.   On: 04/24/2016 15:17   US Abdomen Limited 04/24/2016  Moderate ascites. Electronically Signed   By: Nolon Nations M.D.   On: 04/24/2016 15:29   US Paracentesis 04/26/2016  Successful ultrasound-guided paracentesis yielding 6 liters of peritoneal fluid. We stopped at 6 L as this is a first  time paracentesis maximum. Read by: Saverio Danker, PA-C Electronically Signed   By: Lucrezia Europe M.D.   On: 04/25/2016 16:46   Dg Chest Port 1 View 04/25/2016   Moderate partial loculated right pleural effusion, atelectasis or infiltrate in right lower lobe. No pulmonary edema. Small left pleural effusion with left basilar atelectasis. Electronically Signed   By: Lahoma Crocker M.D.   On: 04/25/2016 14:41   Dg Chest Port 1 View 04/24/2016   Mild worsening of right central airspace opacity and probable continued enlargement of right pleural effusion. This likely represents congestive heart failure with asymmetric alveolar edema. Electronically Signed   By: Andreas Newport M.D.   On: 04/24/2016 03:28   Dg Tibia/fibula Left Port 04/24/2016  Negative Electronically Signed   By: Andreas Newport M.D.   On: 04/24/2016 03:55     Scheduled Meds: . amiodarone  100 mg Oral Daily  . apixaban  5 mg Oral BID  . atorvastatin  40 mg Oral q1800  . carvedilol  3.125 mg Oral BID WC  . CeFEPime   2 g Intravenous Q T,Th,Sat-1800  . clobetasol ointment  1 application Topical BID  . clopidogrel  75 mg Oral Daily  . gabapentin  300 mg Oral QHS  . heparin  2,600 Units Dialysis Once in dialysis  . insulin aspart  0-9 Units Subcutaneous TID WC  . multivitamin   1 tablet Oral Daily  . polyethylene glycol  17 g Oral Daily  . silver sulfADIAZINE  1 application Topical BID  . sodium thiosulfate   25 g  Intravenous Q T,Th,Sa-HD  . sucroferric oxyhydroxide  1,000 mg Oral TID WC   Continuous Infusions:    LOS: 6 days    Time spent: 25 minutes  Greater than 50% of the time spent on counseling and coordinating the care.   Leisa Lenz, MD [facility] Pager [phone]  If 7PM-7AM, please contact night-coverage [email or web address] Password [email or web address] 04/30/2016, 11:05 AM

## 2016-04-30 NOTE — Progress Notes (Signed)
Daily Progress Note   Patient Name: Tony Freeman       Date: 04/30/2016 DOB: Nov 15, 1950  Age: 65 y.o. MRN#: [id number] Attending Physician: Robbie Lis, MD Primary Care Physician: Tanya Nones, MD Admit Date: 04/24/2016  Reason for Consultation/Follow-up: Establishing goals of care  Subjective: Patient is mildly confused this morning (before receiving fentanyl).  He is polite but too sleepy to complete a thought. I spoke with [name] on the phone to let him know the Living Will, HCPOA paper work was in his father's room.  He will try to complete that when he comes to the hospital later on today.  Length of Stay: 6  Current Medications: Scheduled Meds:  . amiodarone  100 mg Oral Daily  . apixaban  5 mg Oral BID  . atorvastatin  40 mg Oral q1800  . carvedilol  3.125 mg Oral BID WC  . [START ON 05/01/2016] ceFEPime (MAXIPIME) IV  2 g Intravenous Q T,Th,Sat-1800  . clobetasol ointment  1 application Topical BID  . clopidogrel  75 mg Oral Daily  . gabapentin  300 mg Oral QHS  . heparin  2,600 Units Dialysis Once in dialysis  . insulin aspart  0-9 Units Subcutaneous TID WC  . multivitamin with minerals  1 tablet Oral Daily  . polyethylene glycol  17 g Oral Daily  . silver sulfADIAZINE  1 application Topical BID  . sodium chloride flush  3 mL Intravenous Q12H  . sodium thiosulfate infusion for calciphylaxis  25 g Intravenous Q T,Th,Sa-HD  . sucroferric oxyhydroxide  1,000 mg Oral TID WC    Continuous Infusions:    PRN Meds: sodium chloride, sodium chloride, acetaminophen **OR** acetaminophen, albuterol, camphor-menthol **AND** hydrOXYzine, docusate sodium, docusate sodium, feeding supplement (NEPRO CARB STEADY), fentaNYL (SUBLIMAZE) injection, mometasone-formoterol,  nitroGLYCERIN, ondansetron **OR** ondansetron (ZOFRAN) IV, oxyCODONE-acetaminophen, sorbitol, traMADol, zolpidem  Physical Exam         Wd, male, awake but thinking seems slowed. CV rrr Resp NAD Abdomen appears slightly more distended today than yesterday. Extremities able to move all 4   Vital Signs: BP 115/43 mmHg  Pulse 69  Temp(Src) 98.7 F (37.1 C) (Oral)  Resp 18  Ht 5\' 6"  (1.676 m)  Wt 75.5 kg (166 lb 7.2 oz)  BMI 26.88 kg/m2  SpO2 91% SpO2: SpO2: 91 %  O2 Device: O2 Device: Not Delivered O2 Flow Rate: O2 Flow Rate (L/min): 2 L/min  Intake/output summary:  Intake/Output Summary (Last 24 hours) at 04/30/16 1026 Last data filed at 04/30/16 1006  Gross per 24 hour  Intake    720 ml  Output   2000 ml  Net  -1280 ml   LBM: Last BM Date: 04/28/16 Baseline Weight: Weight: 85.276 kg (188 lb) Most recent weight: Weight: 75.5 kg (166 lb 7.2 oz)       Palliative Assessment/Data:    Flowsheet Rows        Most Recent Value   Intake Tab    Referral Department  Hospitalist   Unit at Time of Referral  Cardiac/Telemetry Unit   Palliative Care Primary Diagnosis  Nephrology   Date Notified  04/29/16   Palliative Care Type  New Palliative care   Reason for referral  Pain   Date first seen by Palliative Care  04/29/16   # of days Palliative referral response time  0 Day(s)   Clinical Assessment    Palliative Performance Scale Score  30%   Psychosocial & Spiritual Assessment    Palliative Care Outcomes    Patient/Family meeting held?  Yes   Who was at the meeting?  patient, son, nephew Vincente Liberty, dtr in law, grandson   Palliative Care Outcomes  Improved pain interventions, Clarified goals of care, Counseled regarding hospice      Patient Active Problem List   Diagnosis Date Noted  . Palliative care encounter   . Leg pain, anterior   . Goals of care, counseling/discussion   . Pressure ulcer 04/27/2016  . Hypoxia   . Calciphylaxis   . Ascites 04/25/2016  .  Leukocytosis 04/25/2016  . Acute respiratory failure with hypoxia ([place]) 04/24/2016  . Leg pain 04/24/2016  . Hyperkalemia   . Hypervolemia   . Pleural effusion   . Pleural effusion on right 03/03/2016  . Calcification of soft tissue   . HCAP (healthcare-associated pneumonia) 01/10/2016  . Ulcer of penis 01/10/2016  . ESRD (end stage renal disease) on dialysis ([place])   . Chronic kidney disease, stage V ([place]) 11/19/2015  . Encounter for therapeutic drug monitoring 06/25/2015  . Atrial fibrillation, unspecified 06/19/2015  . Chronic systolic heart failure ([place]) 05/25/2015  . HLD (hyperlipidemia) 05/25/2015  . CN (constipation) 05/25/2015  . Protein-calorie malnutrition ([place]) 05/25/2015  . ESRD (end stage renal disease) ([place]) 05/24/2015  . Neuropathy ([place]) 05/24/2015  . Atrial fibrillation ([place]) 05/24/2015  . Anemia of chronic disease 02/14/2015  . Hypercalcemia 01/24/2015  . Acute bronchitis 01/24/2015  . Syncope 07/20/2014  . Lower extremity edema 07/16/2014  . Hypoglycemia 07/16/2014  . Acute renal failure superimposed on stage 4 chronic kidney disease ([place]) 07/16/2014  . Acute on chronic combined systolic and diastolic heart failure ([place]) 07/16/2014  . Chronic kidney disease (CKD), stage IV (severe) ([place]) 04/04/2014  . DM (diabetes mellitus), type 2, uncontrolled, with renal complications ([place]) 0000000  . Essential hypertension, benign 04/04/2014  . Chronic combined systolic and diastolic CHF (congestive heart failure) ([place]) 04/04/2014  . CAD (coronary artery disease) 04/04/2014  . Other and unspecified hyperlipidemia 04/04/2014  . Abnormal renal ultrasound with left renal lesion and bladder wall thickening 04/04/2014  . Microscopic hematuria 04/04/2014  . Abnormal brain CT/age indeterminate lacunar infarct 04/04/2014  . PAF (paroxysmal atrial fibrillation) ([place]) 04/04/2014  . Hyperglycemia 04/02/2014  . Sepsis ([place]) 04/02/2014  . Encephalopathy 04/02/2014  . Elevated  troponin 04/02/2014  . Renal failure  04/02/2014    Palliative Care Assessment & Plan   Patient Profile:  65 y.o. male with past medical history of ESRD, mixed heart failure with an EF of 15 - 20%, cardiac arrest s/p AICD placement, aflutter, dementia, who was admitted on 04/24/2016 with worsening LLE pain and acute respiratory failure secondary to volume overload and possible pneumonia. The leg pain is felt to be due to calciphylaxis ulcerations. Since admission he has received daily HD to remove volume and excess calcium. He also received thiosulfate in HD to reduce the calcium in his blood. Due to ascites he underwent paracentesis and 6L was removed.  Recommendations/Plan:  Will meet with patient and son to complete HCPOA/Living Will.    Family understands poor prognosis but chooses to continue full code for now.  Patient received a total of 40 mg of oxycodone (percocet) on 7/4.  He had pain, but it appeared controlled.  40 mg of oxy converts to a 25 mcg fentanyl TD patch.  Allowing for cross tolerance a 12.5 mcg fentanyl patch would provide long lasting continuous control without oversedation.  I will defer this decision to the primary team.  Please include an order for Palliative Medicine to follow outpatient in the d/c summary if he goes to SNF or in a case management order if he discharges to his son's home.  Goals of Care and Additional Recommendations:  Limitations on Scope of Treatment: Full Scope Treatment   Code Status: Full Code   Prognosis: Likely 6-12 months given ESRD, ESHF, calciphylaxis.   Discharge Planning:  Outpatient Palliative Follow up either at home or at SNF. Please add this into D/C summary.   Care plan was discussed with patient and son, [name].  Thank you for allowing the Palliative Medicine Team to assist in the care of this patient.   Time In: 8:00 Time Out: 8:25  Total Time 25 min Prolonged Time Billed no      Greater than 50%  of this time  was spent counseling and coordinating care related to the above assessment and plan.  Imogene Burn, PA-C Palliative Medicine Pager: [phone]  Please contact Palliative Medicine Team phone at [phone] for questions and concerns.

## 2016-04-30 NOTE — Progress Notes (Signed)
Tony Freeman Progress Note  Assessment/Plan: 1 L leg pain/ swelling/ erythema - cellulitis and/or calciphylaxis; blood cultures neg . 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 past week Noted per Dr Tony Freeman [facility] at this time, consider skin biopsy during the week. the goal is to get as much Ca out of the body as possible using low Ca bath and avoiding all Ca/ vit D products. The goal uncorr Ca for him will be 7.0- 8.0 (current Ca 10 on admit, down to 9.5 today). Also use Na thiosulfate as antioxidant w HD tiw. Stopped coumadin, now on apixaban. 5 Anemia (ESRD) hgb 10.4 not on outpatent ESA - follow hgb 6 MBD- with Calciphylaxis - Am labs = Phos 7.7 down to 5.4. 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 patient has low health literacy/ Dr. Jonnie Freeman spoke w pt's son and painted a not so optimistic picture.  Had palliative care consult with following plan: full code, CIR eval, consider fentanyl patch - has IV fentayl added prn (had dose at 826 am which probably explains his drowsiness) 13. Malnutrition - intake poor alb 2.3  Tony Jacobson, PA-C [facility] [phone] 04/30/2016,9:25 AM  LOS: 6 days  I have seen and examined this patient and agree with plan per Tony Freeman.  Pain a little better but  he seems a little groggy.   I don't see him going home as it will be a lot of work for his son.   Will most likely need NHP.  Prognosis is poor.  Plan HD tomorrow.Marland Kitchen Tony Freeman T,MD 04/30/2016 10:24 AM Subjective:   Drowsy, cannot remember name of president. [facility], 2017  Objective Filed Vitals:   04/29/16 1702 04/29/16 2012 04/30/16 0424 04/30/16 0753  BP: 124/50 119/32 115/54 115/43  Pulse: 76 69 73 69  Temp: 98.6 F (37 C) 97.6 F (36.4 C) 98.5 F (36.9 C) 98.7 F (37.1 C)  TempSrc: Oral Oral Oral Oral  Resp: 18 18 17 18   Height:      Weight:  75.5 kg (166 lb 7.2 oz)    SpO2: 98% 94% 92% 91%   Physical Exam General: sitting with breakfast tray - not eating - drowsy  Heart: RR - paced rhythm on tele Lungs: crackles right base  Abdomen: ++ ascites distended Extremities: tr LE edema, dressing on both legs Dialysis Access: right upper AVF maturing - right IJ active  Dialysis Orders: AF TTS 78kg 3K/2.25 bath P4 Hep 2600 IJ cath (maturing RUA AVF) Na thio 25 gm (for calciphylaxis)  Additional Objective Labs: Basic Metabolic Panel:  Recent Labs Lab 04/28/16 0347 04/29/16 0511 04/30/16 0619  NA 130* 132* 131*  K 5.7* 5.1  4.8  CL 85* 96* 94*  CO2 20* 22 25  GLUCOSE 159* 132* 170*  BUN 80* 45* 37*  CREATININE 7.40* 5.22* 4.35*  CALCIUM 10.2 8.7* 9.0  PHOS 7.7* 6.0* 5.4*   Liver Function Tests:  Recent Labs Lab 04/24/16 0710  04/25/16 1000  04/27/16 0623 04/28/16 0347 04/29/16 0511 04/30/16 0619  AST 25  --  26  --  23  --   --   --   ALT 20  --  19  --  17  --   --   --   ALKPHOS 145*  --  137*  --  151*  --   --   --   BILITOT 1.8*  --  1.7*  --  1.3*  --   --   --   PROT 7.2  --  7.6  --  6.6  --   --   --   ALBUMIN 3.0*  < > 2.9*  < > 2.5*  2.5* 2.5* 2.5* 2.3*  < > = values in this interval not displayed. No results for input(s): LIPASE, AMYLASE in the last 168 hours. CBC:  Recent Labs Lab 04/25/16 0216 04/26/16 0527 04/27/16 0623  04/28/16 0347 04/29/16 0511 04/30/16 0609  WBC 17.2* 13.5* 12.9* 13.9* 12.3* 13.8*  NEUTROABS 14.5* 11.3* 10.6*  --   --   --   HGB 9.8* 10.1* 10.2* 10.0* 9.8* 10.4*  HCT 30.8* 31.6* 31.6* 30.9* 30.6* 33.1*  MCV 101.3* 100.0 99.1 97.8 98.4 103.1*  PLT 164 146* 154 146* 156 132*   Blood Culture    Component Value Date/Time   SDES PERITONEAL FLUID 04/25/2016 1624   SDES FLUID PERITONEAL 04/25/2016 1624   SPECREQUEST NONE 04/25/2016 1624   SPECREQUEST BOTTLES DRAWN AEROBIC AND ANAEROBIC 10CC 04/25/2016 1624   CULT NO GROWTH 4 DAYS 04/25/2016 1624   REPTSTATUS 04/25/2016 FINAL 04/25/2016 1624   REPTSTATUS PENDING 04/25/2016 1624    Cardiac Enzymes:  Recent Labs Lab 04/24/16 0141 04/24/16 0710 04/24/16 1353 04/24/16 1822  TROPONINI 0.28* 0.34* 0.39* 0.40*   CBG:  Recent Labs Lab 04/28/16 2255 04/29/16 1150 04/29/16 1629 04/29/16 2010 04/30/16 0750  GLUCAP 195* 96 176* 197* 175*   Medications:   . amiodarone  100 mg Oral Daily  . apixaban  5 mg Oral BID  . atorvastatin  40 mg Oral q1800  . carvedilol  3.125 mg Oral BID WC  . [START ON 05/01/2016] ceFEPime (MAXIPIME) IV  2 g Intravenous Q T,Th,Sat-1800  . clobetasol ointment  1 application Topical BID  . clopidogrel  75 mg Oral Daily  . gabapentin  300 mg Oral QHS  . heparin  2,600 Units Dialysis Once in dialysis  . insulin aspart  0-9 Units Subcutaneous TID WC  . multivitamin with minerals  1 tablet Oral Daily  . polyethylene glycol  17 g Oral Daily  . silver sulfADIAZINE  1 application Topical BID  . sodium chloride flush  3 mL Intravenous Q12H  . sodium thiosulfate infusion for calciphylaxis  25 g Intravenous Q T,Th,Sa-HD  . sucroferric oxyhydroxide  1,000 mg Oral TID WC

## 2016-04-30 NOTE — Progress Notes (Signed)
Physical Therapy Treatment Patient Details Name: Tony Freeman MRN: [id number] DOB: [birth year] Today's Date: 04/30/2016    History of Present Illness Tony Freeman is a 65 y.o. male with with history of chronic atrial fibrillation, ischemic cardiomyopathy status post ICD placement, CAD status post stenting, diabetes mellitus type 2, ESRD on hemodialysis on Tuesday Thursday since Saturday and chronic combined systolic and diastolic heart failure.  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     PT Comments    Pt very lethargic throughout session, repeatedly drifting off during mobility. Able to attempt sit/stand X2 but unsafe to attempt ambulation at this time. Repeated cues needed for all steps of mobility and frequent orientation to task. Discussed lethargy with nursing who confirms that it is likely medication related due to increased pain earlier. PT to continue to follow, anticipate improved mobility if lethargy improved.   Follow Up Recommendations  SNF;Supervision/Assistance - 24 hour     Equipment Recommendations  None recommended by PT    Recommendations for Other Services       Precautions / Restrictions Precautions Precautions: Fall Restrictions Weight Bearing Restrictions: No    Mobility  Bed Mobility Overal bed mobility: Needs Assistance Bed Mobility: Supine to Sit;Sit to Supine     Supine to sit: Mod assist Sit to supine: +2 for physical assistance;Mod assist   General bed mobility comments: Pt having difficutly following commands due to lethargy.   Transfers Overall transfer level: Needs assistance Equipment used: Rolling walker (2 wheeled) Transfers: Sit to/from Stand Sit to Stand: Mod assist         General transfer comment: Performed X2, able to stand less than 15 seconds each attempt  as unable to maintain standing. Pt sitting EOB approx. 10 minutes, pt unwilling or unable to follow commands to return to supine, required nursing assist.   Ambulation/Gait             General Gait Details: unable to safety attempt   Stairs            Wheelchair Mobility    Modified Rankin (Stroke Patients Only)       Balance Overall balance assessment: Needs assistance Sitting-balance support: Bilateral upper extremity supported Sitting balance-Leahy Scale: Poor     Standing balance support: Bilateral upper extremity supported Standing balance-Leahy Scale: Poor                      Cognition Arousal/Alertness: Lethargic;Suspect due to medications Behavior During Therapy: Flat affect Overall Cognitive Status: Impaired/Different from baseline                      Exercises      General Comments        Pertinent Vitals/Pain Pain Assessment: Faces Faces Pain Scale: Hurts whole lot Pain Location: LLE Pain Descriptors / Indicators: Grimacing;Moaning Pain Intervention(s): Limited activity within patient's tolerance;Monitored during session    Home Living                      Prior Function            PT Goals (current goals can now be found in the care plan section) Acute Rehab PT Goals Patient Stated Goal: not expressed PT Goal Formulation: With patient/family Time For Goal Achievement: 05/09/16 Potential to Achieve Goals: Fair Progress towards  PT goals: Progressing toward goals    Frequency  Min 3X/week    PT Plan Current plan remains appropriate    Co-evaluation             End of Session Equipment Utilized During Treatment: Gait belt Activity Tolerance: Patient limited by lethargy Patient left: in bed;with call bell/phone within reach;with bed alarm set;with nursing/sitter in room     Time: 1325-1354 PT Time Calculation (min) (ACUTE ONLY): 29 min  Charges:  $Therapeutic Activity: 23-37 mins                     G Codes:      Cassell Clement, PT, CSCS Pager [phone] Office [phone]  04/30/2016, 3:51 PM

## 2016-05-01 DIAGNOSIS — E875 Hyperkalemia: Secondary | ICD-10-CM

## 2016-05-01 LAB — GLUCOSE, CAPILLARY
GLUCOSE-CAPILLARY: 98 mg/dL (ref 65–99)
GLUCOSE-CAPILLARY: 170 mg/dL — AB (ref 65–99)
GLUCOSE-CAPILLARY: 189 mg/dL — AB (ref 65–99)
Glucose-Capillary: 196 mg/dL — ABNORMAL HIGH (ref 65–99)

## 2016-05-01 MED ORDER — LIDOCAINE-PRILOCAINE 2.5-2.5 % EX CREA: 1.0000 "application " | TOPICAL_CREAM | CUTANEOUS | Status: DC | PRN

## 2016-05-01 MED ORDER — FENTANYL 12 MCG/HR TD PT72
12.5000 ug | MEDICATED_PATCH | TRANSDERMAL | Status: DC
  Administered 2016-05-01: 12.5 ug via TRANSDERMAL
  Filled 2016-05-01: qty 1

## 2016-05-01 MED ORDER — SODIUM CHLORIDE 0.9 % IV SOLN: 100.0000 mL | INTRAVENOUS | Status: DC | PRN

## 2016-05-01 MED ORDER — LIDOCAINE HCL (PF) 1 % IJ SOLN
5.0000 mL | INTRAMUSCULAR | Status: DC | PRN
Start: 2016-05-01 — End: 2016-05-01

## 2016-05-01 MED ORDER — ALTEPLASE 2 MG IJ SOLR: 2.0000 mg | Freq: Once | INTRAMUSCULAR | Status: DC | PRN

## 2016-05-01 MED ORDER — HEPARIN SODIUM (PORCINE) 1000 UNIT/ML DIALYSIS: 20.0000 [IU]/kg | INTRAMUSCULAR | Status: DC | PRN

## 2016-05-01 MED ORDER — PENTAFLUOROPROP-TETRAFLUOROETH EX AERO: 1.0000 "application " | INHALATION_SPRAY | CUTANEOUS | Status: DC | PRN

## 2016-05-01 MED ORDER — HEPARIN SODIUM (PORCINE) 1000 UNIT/ML DIALYSIS: 1000.0000 [IU] | INTRAMUSCULAR | Status: DC | PRN

## 2016-05-01 MED ORDER — FENTANYL CITRATE (PF) 100 MCG/2ML IJ SOLN
12.5000 ug | INTRAMUSCULAR | Status: DC | PRN
  Administered 2016-05-01 – 2016-05-02 (×4): 12.5 ug via INTRAVENOUS
  Filled 2016-05-01 (×4): qty 2

## 2016-05-01 NOTE — Progress Notes (Signed)
Occupational Therapy Treatment Patient Details Name: Tony Freeman MRN: [id number] DOB: 1951/10/14 Today's Date: 05/01/2016    History of present illness Tony Freeman is a 65 y.o. male with with history of chronic atrial fibrillation, ischemic cardiomyopathy status post ICD placement, CAD status post stenting, diabetes mellitus type 2, ESRD on hemodialysis on Tuesday Thursday since Saturday and chronic combined systolic and diastolic heart failure.  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 Pt with poor prognosis with palliative care involved.   OT comments  Pt limited by severe pain in LEs, but willing to perform OOB mobility and seated grooming. Son in room throughout session encouraging pt's participation. Pt requiring +2 assist for short distance ambulation and close follow with chair with pt sitting abruptly.   Follow Up Recommendations  SNF;Supervision/Assistance - 24 hour    Equipment Recommendations       Recommendations for Other Services      Precautions / Restrictions Precautions Precautions: Fall Restrictions Weight Bearing Restrictions: No       Mobility Bed Mobility Overal bed mobility: Needs Assistance Bed Mobility: Supine to Sit     Supine to sit: Mod assist     General bed mobility comments: VCs for sequencing, assist to raise trunk and to advance hips to EOB  Transfers Overall transfer level: Needs assistance Equipment used: Rolling walker (2 wheeled) Transfers: Sit to/from Stand Sit to Stand: +2 physical assistance;Mod assist         General transfer comment: performed from bed with one hand on walker, one on bed, assist to rise and shift weight anterior    Balance     Sitting balance-Leahy Scale: Fair Sitting balance - Comments: min guard assist     Standing balance-Leahy  Scale: Poor                     ADL Overall ADL's : Needs assistance/impaired     Grooming: Wash/dry hands;Wash/dry face;Set up;Sitting           Upper Body Dressing : Sitting;Moderate assistance Upper Body Dressing Details (indicate cue type and reason): front opening gown     Toilet Transfer: +2 for physical assistance;Moderate assistance;RW;Ambulation Toilet Transfer Details (indicate cue type and reason): simulated to chair Toileting- Clothing Manipulation and Hygiene: Total assistance;Bed level       Functional mobility during ADLs: +2 for safety/equipment;Moderate assistance;Rolling walker General ADL Comments: Pt requiring encouragement for OOB. Son reluctant for pt to have IV pain medication as it makes him lethargic, but pt with severe pain which was unchanged with activity.      Vision                     Perception     Praxis      Cognition   Behavior During Therapy: Flat affect Overall Cognitive Status: Impaired/Different from baseline Area of Impairment: Problem solving;Attention;Following commands;Safety/judgement   Current Attention Level: Sustained    Following Commands: Follows one step commands with increased time (and tactile cues) Safety/Judgement: Decreased awareness of safety;Decreased awareness of deficits   Problem Solving: Slow processing;Decreased initiation;Difficulty sequencing;Requires verbal cues;Requires tactile cues General Comments: pt distracted by pain, sits abruptly    Extremity/Trunk Assessment               Exercises     Shoulder Instructions  General Comments      Pertinent Vitals/ Pain       Pain Assessment: Faces Faces Pain Scale: Hurts worst Pain Location: LEs Pain Descriptors / Indicators: Guarding;Grimacing;Moaning Pain Intervention(s): Premedicated before session;Limited activity within patient's tolerance;Monitored during session;Repositioned  Home Living                                           Prior Functioning/Environment              Frequency Min 2X/week     Progress Toward Goals  OT Goals(current goals can now be found in the care plan section)  Progress towards OT goals: Not progressing toward goals - comment (increased pain)  Acute Rehab OT Goals Patient Stated Goal: pain relief Time For Goal Achievement: 05/13/16 Potential to Achieve Goals: [place] Discharge plan remains appropriate    Co-evaluation    PT/OT/SLP Co-Evaluation/Treatment: Yes Reason for Co-Treatment: For patient/therapist safety   OT goals addressed during session: ADL's and self-care      End of Session Equipment Utilized During Treatment: Gait belt;Rolling walker   Activity Tolerance Patient limited by pain   Patient Left in chair;with call bell/phone within reach;with chair alarm set;with family/visitor present;with nursing/sitter in room   Nurse Communication Patient requests pain meds;Mobility status (prognosis poor)        Time: [id number] OT Time Calculation (min): 37 min  Charges: OT General Charges $OT Visit: 1 Procedure OT Treatments $Self Care/Home Management : 8-22 mins  Malka So 05/01/2016, 3:16 PM  [phone]

## 2016-05-01 NOTE — Progress Notes (Signed)
Physical Therapy Treatment Patient Details Name: Graydon Dunmire MRN: [id number] DOB: [birth year] Today's Date: 05/01/2016    History of Present Illness Dustine Weisner is a 65 y.o. male with with history of chronic atrial fibrillation, ischemic cardiomyopathy status post ICD placement, CAD status post stenting, diabetes mellitus type 2, ESRD on hemodialysis on Tuesday Thursday since Saturday and chronic combined systolic and diastolic heart failure.  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 Pt with poor prognosis with palliative care involved.    PT Comments    The patient  Complaining of increased pain, had been medicated and to get more IV meds. Son present during session. The patient did mobilize and ambulated x 12' with 2 person assist, requiring extra time for all mobility.   Follow Up Recommendations  SNF;Supervision/Assistance - 24 hour     Equipment Recommendations  None recommended by PT    Recommendations for Other Services       Precautions / Restrictions Precautions Precautions: Fall Restrictions Weight Bearing Restrictions: No    Mobility  Bed Mobility Overal bed mobility: Needs Assistance Bed Mobility: Supine to Sit     Supine to sit: Mod assist     General bed mobility comments: VCs for sequencing, assist to raise trunk and to advance hips to EOB  Transfers Overall transfer level: Needs assistance Equipment used: Rolling walker (2 wheeled) Transfers: Sit to/from Stand Sit to Stand: +2 physical assistance;Mod assist         General transfer comment: performed from bed with one hand on walker, one on bed, assist to rise and shift weight anterior, extra time to complete task  Ambulation/Gait Ambulation/Gait assistance: Mod assist;+2 safety/equipment;+2 physical assistance Ambulation  Distance (Feet): 12 Feet   Gait Pattern/deviations: Step-to pattern;Wide base of support;Trunk flexed;Antalgic;Decreased step length - left;Decreased stance time - left     General Gait Details: multimodal cues for posture and fore position inside the RW. extra time to work through pain.   Stairs            Wheelchair Mobility    Modified Rankin (Stroke Patients Only)       Balance     Sitting balance-Leahy Scale: Fair Sitting balance - Comments: min guard assist   Standing balance support: During functional activity;Bilateral upper extremity supported Standing balance-Leahy Scale: Poor                      Cognition Arousal/Alertness: Awake/alert Behavior During Therapy: Flat affect Overall Cognitive Status: Impaired/Different from baseline Area of Impairment: Problem solving;Attention;Following commands;Safety/judgement   Current Attention Level: Sustained   Following Commands: Follows one step commands with increased time (and tactile cues) Safety/Judgement: Decreased awareness of safety;Decreased awareness of deficits   Problem Solving: Slow processing;Decreased initiation;Difficulty sequencing;Requires verbal cues;Requires tactile cues General Comments: pt distracted by pain, sits abruptly    Exercises      General Comments        Pertinent Vitals/Pain Pain Assessment: Faces Faces Pain Scale: Hurts worst Pain Location: legs Pain Descriptors / Indicators: Discomfort;Grimacing;Guarding;Moaning Pain Intervention(s): Premedicated before session;Patient requesting pain meds-RN notified;Monitored during session    Home Living                      Prior Function            PT Goals (current goals can now  be found in the care plan section) Acute Rehab PT Goals Patient Stated Goal: pain relief Progress towards PT goals: Progressing toward goals    Frequency  Min 3X/week    PT Plan Current plan remains appropriate     Co-evaluation PT/OT/SLP Co-Evaluation/Treatment: Yes Reason for Co-Treatment: For patient/therapist safety PT goals addressed during session: Mobility/safety with mobility OT goals addressed during session: ADL's and self-care     End of Session Equipment Utilized During Treatment: Gait belt Activity Tolerance: Patient limited by pain Patient left: in chair;with call bell/phone within reach;with chair alarm set;with family/visitor present;with nursing/sitter in room     Time: 1345-1415 PT Time Calculation (min) (ACUTE ONLY): 30 min  Charges:  $Gait Training: 8-22 mins                    G Codes:      Claretha Cooper 05/01/2016, 3:46 PM

## 2016-05-01 NOTE — Progress Notes (Signed)
Daily Progress Note   Patient Name: Tony Freeman       Date: 05/01/2016 DOB: [birth year]  Age: 65 y.o. MRN#: [id number] Attending Physician: Robbie Lis, MD Primary Care Physician: Tanya Nones, MD Admit Date: 04/24/2016  Reason for Consultation/Follow-up: Pain control  Subjective: Called by RN regarding pain medications.  The patient had a mix of tramadol, percocet and fentanyl.  Son had questions regarding narcotic meds.  After reviewing med hx and discussing with nephrology, I will d/c tramadol and percocet,  -  place the patient on fentanyl 12.5 mcg transdermal patch and continue prn fentanyl IV.  The fentanyl patch should take full effect in 10-12 hours.  Tomorrow morning we can re-evaluate his pain control and mental status and determine if the dose is correct.  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  We discussed that it was very painful and a bad prognostic indicator.  After discussing with nephrology I recommend that patient be evaluated at the wound care center after discharge.  Reportedly they have had some success healing these types of wounds utilizing hyperbaric oxygen therapy.  Son also requested I fax the "blood test result for syphilis" to the patient's dermatologist, Dr. Crista Luria.  I did this thru chart review in epic.    Length of Stay: 7  Current Medications: Scheduled Meds:  . amiodarone  100 mg Oral Daily  . apixaban  5 mg Oral BID  . atorvastatin  40 mg Oral q1800  . carvedilol  3.125 mg Oral BID WC  . ceFEPime (MAXIPIME) IV  2 g Intravenous Q T,Th,Sat-1800  . clobetasol ointment  1 application Topical BID  . clopidogrel  75 mg Oral Daily  . fentaNYL  12.5 mcg Transdermal Q72H  . gabapentin  300 mg Oral QHS  . heparin   2,600 Units Dialysis Once in dialysis  . insulin aspart  0-9 Units Subcutaneous TID WC  . multivitamin with minerals  1 tablet Oral Daily  . polyethylene glycol  17 g Oral Daily  . silver sulfADIAZINE  1 application Topical BID  . sodium chloride flush  3 mL Intravenous Q12H  . sodium thiosulfate infusion for calciphylaxis  25 g Intravenous Q T,Th,Sa-HD  . sucroferric oxyhydroxide  1,000 mg Oral TID WC    Continuous Infusions:  PRN Meds: sodium chloride, sodium chloride, acetaminophen **OR** acetaminophen, albuterol, camphor-menthol **AND** hydrOXYzine, docusate sodium, docusate sodium, feeding supplement (NEPRO CARB STEADY), fentaNYL (SUBLIMAZE) injection, mometasone-formoterol, nitroGLYCERIN, ondansetron **OR** ondansetron (ZOFRAN) IV, sorbitol, zolpidem  Physical Exam         Wd, elderly pleasant male.  Slightly sleepy, slow but clear speech. CV rrr Resp NAD Ext able to move all 4  Vital Signs: BP 136/56 mmHg  Pulse 73  Temp(Src) 98.1 F (36.7 C) (Oral)  Resp 17  Ht 5\' 6"  (1.676 m)  Wt 72 kg (158 lb 11.7 oz)  BMI 25.63 kg/m2  SpO2 100% SpO2: SpO2: 100 % O2 Device: O2 Device: Not Delivered O2 Flow Rate: O2 Flow Rate (L/min): 2 L/min  Intake/output summary:  Intake/Output Summary (Last 24 hours) at 05/01/16 1547 Last data filed at 05/01/16 1403  Gross per 24 hour  Intake    720 ml  Output   2500 ml  Net  -1780 ml   LBM: Last BM Date: 05/01/16 Baseline Weight: Weight: 85.276 kg (188 lb) Most recent weight: Weight: 72 kg (158 lb 11.7 oz)       Palliative Assessment/Data:    Flowsheet Rows        Most Recent Value   Intake Tab    Referral Department  Hospitalist   Unit at Time of Referral  Cardiac/Telemetry Unit   Palliative Care Primary Diagnosis  Nephrology   Date Notified  04/28/16   Palliative Care Type  New Palliative care   Reason for referral  Pain   Date of Admission  04/24/16   Date first seen by Palliative Care  04/29/16   # of days Palliative  referral response time  1 Day(s)   # of days IP prior to Palliative referral  4   Clinical Assessment    Palliative Performance Scale Score  30%   Psychosocial & Spiritual Assessment    Palliative Care Outcomes    Patient/Family meeting held?  Yes   Who was at the meeting?  patient, son, nephew Vincente Liberty, dtr in law, grandson   Palliative Care Outcomes  Improved pain interventions, Clarified goals of care, Counseled regarding hospice      Patient Active Problem List   Diagnosis Date Noted  . Left leg pain   . Palliative care encounter   . Leg pain, anterior   . Goals of care, counseling/discussion   . Pressure ulcer 04/27/2016  . Hypoxia   . Calciphylaxis   . Ascites 04/25/2016  . Leukocytosis 04/25/2016  . Acute respiratory failure with hypoxia ([place]) 04/24/2016  . Leg pain 04/24/2016  . Hyperkalemia   . Hypervolemia   . Pleural effusion   . Pleural effusion on right 03/03/2016  . Calcification of soft tissue   . HCAP (healthcare-associated pneumonia) 01/10/2016  . Ulcer of penis 01/10/2016  . ESRD (end stage renal disease) on dialysis ([place])   . Chronic kidney disease, stage V ([place]) 11/19/2015  . Encounter for therapeutic drug monitoring 06/25/2015  . Atrial fibrillation, unspecified 06/19/2015  . Chronic systolic heart failure ([place]) 05/25/2015  . HLD (hyperlipidemia) 05/25/2015  . CN (constipation) 05/25/2015  . Protein-calorie malnutrition ([place]) 05/25/2015  . ESRD (end stage renal disease) ([place]) 05/24/2015  . Neuropathy ([place]) 05/24/2015  . Atrial fibrillation ([place]) 05/24/2015  . Anemia of chronic disease 02/14/2015  . Hypercalcemia 01/24/2015  . Acute bronchitis 01/24/2015  . Syncope 07/20/2014  . Lower extremity edema 07/16/2014  . Hypoglycemia 07/16/2014  . Acute renal failure superimposed on stage  4 chronic kidney disease ([place]) 07/16/2014  . Acute on chronic combined systolic and diastolic heart failure ([place]) 07/16/2014  . Chronic kidney disease (CKD), stage IV  (severe) ([place]) 04/04/2014  . DM (diabetes mellitus), type 2, uncontrolled, with renal complications ([place]) 0000000  . Essential hypertension, benign 04/04/2014  . Chronic combined systolic and diastolic CHF (congestive heart failure) ([place]) 04/04/2014  . CAD (coronary artery disease) 04/04/2014  . Other and unspecified hyperlipidemia 04/04/2014  . Abnormal renal ultrasound with left renal lesion and bladder wall thickening 04/04/2014  . Microscopic hematuria 04/04/2014  . Abnormal brain CT/age indeterminate lacunar infarct 04/04/2014  . PAF (paroxysmal atrial fibrillation) ([place]) 04/04/2014  . Hyperglycemia 04/02/2014  . Sepsis ([place]) 04/02/2014  . Encephalopathy 04/02/2014  . Elevated troponin 04/02/2014  . Renal failure 04/02/2014    Palliative Care Assessment & Plan   Patient Profile: 65 y.o. male with past medical history of ESRD, mixed heart failure with an EF of 15 - 20%, cardiac arrest s/p AICD placement, aflutter, dementia, who was admitted on 04/24/2016 with worsening LLE pain and acute respiratory failure secondary to volume overload and possible pneumonia. The leg pain is felt to be due to calciphylaxis ulcerations. Since admission he has received daily HD to remove volume and excess calcium. He also received thiosulfate in HD to reduce the calcium in his blood. Due to ascites he underwent paracentesis and 6L was removed.   Recommendations/Plan:  While son is aware that his father's prognosis is limited he supports his choice to remain full code.  Trial fentanyl 12.5 mcg patch with prn fentanyl pushes.  D/C percocet and tramadol.  Will re-evaluate in am prior to d/c  Recommend evaluation at the wound care center after discharge  Please order outpatient Palliative Follow up at SNF in D/C summary.    Code Status:    Code Status Orders        Start     Ordered   04/24/16 0639  Full code   Continuous     04/24/16 0640   Prognosis:   < 12 months given ESRD with  hypercalcemia and probable calciphylaxis as well as end stage heart failure.  Discharge Planning:  [facility] for rehab with Palliative care service follow-up  Care plan was discussed with patient and son  Thank you for allowing the Palliative Medicine Team to assist in the care of this patient.   Time In: 3:00 Time Out: 3:45 Total Time 45 min Prolonged Time Billed no      Greater than 50%  of this time was spent counseling and coordinating care related to the above assessment and plan.  Imogene Burn, PA-C Palliative Medicine Pager: [phone]  Please contact Palliative Medicine Team phone at [phone] for questions and concerns.

## 2016-05-01 NOTE — Progress Notes (Signed)
OT Cancellation Note  Patient Details Name: Tony Freeman MRN: [id number] DOB: [birth year]   Cancelled Treatment:    Reason Eval/Treat Not Completed: Patient at procedure or test/ unavailable (Currently in HD, will follow.)  Malka So 05/01/2016, 8:04 AM  [phone]

## 2016-05-01 NOTE — Progress Notes (Signed)
Patient ID: Tony Freeman, male   DOB: [birth year], 65 y.o.   MRN: [id number]  PROGRESS NOTE    Tony Freeman  [id number] DOB: Mar 22, 1951 DOA: 04/24/2016  PCP: Tanya Nones, MD   Brief Narrative:  65 y.o. male with with history of chronic atrial fibrillation (wa previously on [facility] with coumadin), ischemic cardiomyopathy status post ICD placement, CAD status post stenting, diabetes mellitus type 2, ESRD on hemodialysis on Tuesday, Thursday and Saturday and chronic combined systolic and diastolic heart failure who presented to [facility] ED with increasing pain in his left lower extremity for past few days prior to this admission.  In the ER, patient was found to be febrile, hypoxic and has required BiPAP. Chest x-ray showed airspace opacity and worsening right-sided pleural effusion. Blood work was notable for hyprkalemia forwhich he was given calcium gluconate, sodium bicarbonate and insulin D50. odium bicarbonate and D50.  Patient was admitted for presumed pneumonia and started on empiric antibiotics. Patient transitioned quickly off BiPAP after first hemodialysis session with improvement with volume overload. Patient also noted to likely have left lower extremity and penis calciphylaxis and he was started on IV sodium thiosulfate as well as pain medication. Cardiology was consulted due to elevated troponins felt to be secondary to CHF.   Patient additionally noted to have a nodular liver per CT chest as well as ascites and he underwent paracentesis with 6 L of fluid removed.  Assessment & Plan:   Acute respiratory failure with hypoxia secondary to volume overload / probable healthcare associated pneumonia / Leukocytosis  - Volume statu improved, stable to discharge to SNF per nephrology  - 2-D echo from 11/29/2015 with a EF of 15-20% with severe diffuse hypokinesis and moderate to severe pulmonary artery systolic pressure with a PA peak pressure of 65 mmHg.  - Patient was on  BiPAP on admission but currently on nasal cannula.  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  - The goal uncorrected calcium for pt 7.0- 8.0. Calcium 9 - Continue Na thiosulfate   Hyperkalemia - In the setting of ESRD - Status post sodium bicarbonate, D50, insulin, calcium gluconate. Also on HD - Potassium WNL  Acute on chronic systolic heart failure - 2-D echo from 11/29/2015 with a EF of 15-20% with severe diffuse hypokinesis and moderate to severe pulmonary artery systolic pressure with a PA peak pressure of 65 mmHg.  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  - Continue sliding scale insulin. - CBG"s in past 24 hours: 141, 143, 98 - Continue gabapentin   Dyslipidemia associated with type 2 DM -Continue atorvastatin 40 mg at bedtime   Coronary artery disease/elevated troponin - Elevated troponin likely secondary  to CHF.  - Continue apixaban, plavix - Continue statin therapy  Right pleural effusion - Patient with chronic right pleural effusion. - Case discussed with Dr. Chase Caller of critical care who reviewed the films, per CCM pleural effusion likely secondary to patient's end-stage renal disease and chronic systolic heart failure ( EF 15-20%) and  at this time to continue current management with hemodialysis for volume overload.  Nodular contour of liver /Ascites - Status post paracentesis with 6 L of fluid removed 04/26/2016    DVT prophylaxis: on full dose AC with apixaban  Code Status: full code  Family Communication: no family at the bedside this am Disposition Plan: to SNF once bed available     Consultants:   Nephrology: Dr. Lorrene Reid 04/24/2016  Cardiology: Dr. Aundra Dubin 04/24/2016  Palliative care  Procedures:   Lower extremity Dopplers 04/24/2016  Ultrasound guided Paracentesis with 6 L of tan chylous colored fluid removed 04/25/2016  Antimicrobials:   IV cefepime 04/24/2016  IV vancomycin 04/24/2016 --> 04/28/2016   Subjective: No overnight events.   Objective: Filed Vitals:   05/01/16 1000 05/01/16 1030 05/01/16 1053 05/01/16 1151  BP: 126/64 129/61 115/56 136/56  Pulse: 74 73 75 73  Temp:    98.1 F (36.7 C)  TempSrc:    Oral  Resp: 13 15 16 17   Height:      Weight:   72 kg (158 lb 11.7 oz)   SpO2:    100%    Intake/Output Summary (Last 24 hours) at 05/01/16 1519 Last data filed at 05/01/16 1403  Gross per 24 hour  Intake    720 ml  Output   2500 ml  Net  -1780 ml   Filed Weights   04/30/16 2143 05/01/16 0653 05/01/16 1053  Weight: 74.2 kg (163 lb 9.3 oz) 74.2 kg (163 lb 9.3 oz) 72 kg (158 lb 11.7 oz)    Examination:  General exam: No acute  Respiratory system: No wheezing, no rhonchi  Cardiovascular system: S1 & S2 appreciated, Rate controlled   Gastrointestinal system: (+) BS, non tender abdomen  Central nervous system:  Nonfocal  Extremities: No tenderness, palpable pulses  Skin: LLE ulceration Psychiatry: Normal mood and behavior   Data Reviewed: I have personally reviewed following labs and imaging studies  CBC:  Recent Labs Lab 04/25/16 0216 04/26/16 0527 04/27/16 0623 04/28/16 0347 04/29/16 0511 04/30/16 0609  WBC 17.2* 13.5* 12.9* 13.9* 12.3* 13.8*    NEUTROABS 14.5* 11.3* 10.6*  --   --   --   HGB 9.8* 10.1* 10.2* 10.0* 9.8* 10.4*  HCT 30.8* 31.6* 31.6* 30.9* 30.6* 33.1*  MCV 101.3* 100.0 99.1 97.8 98.4 103.1*  PLT 164 146* 154 146* 156 [id number]*   Basic Metabolic Panel:  Recent Labs Lab 04/25/16 0216 04/26/16 0527 04/27/16 0623 04/28/16 0347 04/29/16 0511 04/30/16 0619  NA 134* 133* 129* 130* 132* 131*  K 4.9 4.6 5.4* 5.7* 5.1 4.8  CL 94* 95* 90* 85* 96* 94*  CO2 24 24 22  20* 22 25  GLUCOSE 130* 145* 145* 159* 132* 170*  BUN 41* 37* 59* 80* 45* 37*  CREATININE 4.68* 4.45* 6.20* 7.40* 5.22* 4.35*  CALCIUM 9.9 9.5 9.3 10.2 8.7* 9.0  MG 2.1  --   --   --   --   --   PHOS 5.3* 5.5* 6.6* 7.7* 6.0* 5.4*   GFR: Estimated Creatinine Clearance: 15.7 mL/min (by C-G formula based on Cr of 4.35). Liver Function Tests:  Recent Labs Lab 04/25/16 1000 04/26/16  [id number] 04/27/16 [id number] 04/28/16 0347 04/29/16 0511 04/30/16 0619  AST 26  --  23  --   --   --   ALT 19  --  17  --   --   --   ALKPHOS 137*  --  151*  --   --   --   BILITOT 1.7*  --  1.3*  --   --   --   PROT 7.6  --  6.6  --   --   --   ALBUMIN 2.9* 2.7* 2.5*  2.5* 2.5* 2.5* 2.3*   No results for input(s): LIPASE, AMYLASE in the last 168 hours. No results for input(s): AMMONIA in the last 168 hours. Coagulation Profile: No results for input(s): INR, PROTIME in the last 168 hours. Cardiac Enzymes:  Recent Labs Lab 04/24/16 1822  TROPONINI 0.40*   BNP (last 3 results) No results for input(s): PROBNP in the last 8760 hours. HbA1C: No results for input(s): HGBA1C in the last 72 hours. CBG:  Recent Labs Lab 04/30/16 0750 04/30/16 1146 04/30/16 1707 04/30/16 2147 05/01/16 1150  GLUCAP 175* 137* 141* 143* 98   Lipid Profile: No results for input(s): CHOL, HDL, LDLCALC, TRIG, CHOLHDL, LDLDIRECT in the last 72 hours. Thyroid Function Tests: No results for input(s): TSH, T4TOTAL, FREET4, T3FREE, THYROIDAB in the last 72 hours. Anemia Panel: No results for  input(s): VITAMINB12, FOLATE, FERRITIN, TIBC, IRON, RETICCTPCT in the last 72 hours. Urine analysis:    Component Value Date/Time   COLORURINE RED* 01/11/2016 2232   APPEARANCEUR CLOUDY* 01/11/2016 2232   LABSPEC 1.026 01/11/2016 2232   PHURINE 5.0 01/11/2016 2232   GLUCOSEU NEGATIVE 01/11/2016 2232   HGBUR MODERATE* 01/11/2016 2232   BILIRUBINUR MODERATE* 01/11/2016 2232   KETONESUR 15* 01/11/2016 2232   PROTEINUR >300* 01/11/2016 2232   UROBILINOGEN 1.0 07/16/2014 1600   NITRITE POSITIVE* 01/11/2016 2232   LEUKOCYTESUR SMALL* 01/11/2016 2232   Sepsis Labs: @LABRCNTIP (procalcitonin:4,lacticidven:4)   Culture, blood (Routine X 2) w Reflex to ID Panel     Status: None   Collection Time: 04/24/16  4:20 AM  Result Value Ref Range Status   Specimen Description BLOOD LEFT ARM  Final   Special Requests AEROBIC BOTTLE ONLY 5ML  Final   Culture NO GROWTH 5 DAYS  Final   Report Status 04/29/2016 FINAL  Final  Culture, blood (Routine X 2) w Reflex to ID Panel     Status: None   Collection Time: 04/24/16  4:25 AM  Result Value Ref Range Status   Specimen Description BLOOD LEFT HAND  Final   Special Requests AEROBIC BOTTLE ONLY 5ML  Final   Culture NO GROWTH 5 DAYS  Final   Report Status 04/29/2016 FINAL  Final  MRSA PCR Screening     Status: None   Collection Time: 04/24/16  5:05 AM  Result Value Ref Range Status   MRSA by PCR NEGATIVE NEGATIVE Final  Gram stain     Status: None   Collection Time: 04/25/16  4:24 PM  Result Value Ref Range Status   Specimen Description PERITONEAL FLUID  Final   Special Requests NONE  Final   Gram Stain   Final    RARE WBC PRESENT, PREDOMINANTLY MONONUCLEAR NO ORGANISMS SEEN    Report Status 04/25/2016 FINAL  Final  Culture, body fluid-bottle     Status: None (Preliminary result)   Collection Time: 04/25/16  4:24 PM  Result Value Ref Range Status   Specimen Description FLUID PERITONEAL  Final  Special Requests BOTTLES DRAWN AEROBIC AND  ANAEROBIC 10CC  Final   Culture NO GROWTH 4 DAYS  Final   Report Status PENDING  Incomplete      Radiology Studies: Dg Chest 2 View 04/27/2016   1. Interval improvement in interstitial edema and right midlung infiltrate 2. Persistent pleural effusions and bibasilar atelectasis/consolidation. 3.  Aortic Atherosclerosis   Dg Chest 2 View 04/24/2016   Mildly worsened central and basilar right lung opacities. This could represent asymmetric alveolar edema or infectious infiltrate. Right pleural effusion is slightly enlarged from 04/17/2016.   Dg Chest 2 View 04/17/2016 1. Dual-lumen right IJ catheter in stable position . 2. Cardiac pacer in stable position. Coronary artery disease. Cardiomegaly with bibasilar atelectasis and/or infiltrates and bilateral pleural effusions unchanged. Electronically Signed   By: Marcello Moores  Register   On: 04/17/2016 09:54   Ct Chest Wo Contrast 04/24/2016 1. Small to moderate right pleural effusion. 2. Small left pleural effusion. 3. Bibasilar atelectasis. 4. Coronary artery disease and mild cardiomegaly. 5. Catheters and pacemaker as described. 6. Abdominal ascites and probable cirrhosis. 7. Cholelithiasis. Electronically Signed   By: Nolon Nations M.D.   On: 04/24/2016 15:17   US Abdomen Limited 04/24/2016  Moderate ascites. Electronically Signed   By: Nolon Nations M.D.   On: 04/24/2016 15:29   US Paracentesis 04/26/2016  Successful ultrasound-guided paracentesis yielding 6 liters of peritoneal fluid. We stopped at 6 L as this is a first time paracentesis maximum. Read by: Saverio Danker, PA-C Electronically Signed   By: Lucrezia Europe M.D.   On: 04/25/2016 16:46   Dg Chest Port 1 View 04/25/2016   Moderate partial loculated right pleural effusion, atelectasis or infiltrate in right lower lobe. No pulmonary edema. Small left pleural effusion with left basilar atelectasis. Electronically Signed   By: Lahoma Crocker M.D.   On: 04/25/2016 14:41   Dg Chest Port 1 View 04/24/2016    Mild worsening of right central airspace opacity and probable continued enlargement of right pleural effusion. This likely represents congestive heart failure with asymmetric alveolar edema. Electronically Signed   By: Andreas Newport M.D.   On: 04/24/2016 03:28   Dg Tibia/fibula Left Port 04/24/2016  Negative Electronically Signed   By: Andreas Newport M.D.   On: 04/24/2016 03:55     Scheduled Meds: . amiodarone  100 mg Oral Daily  . apixaban  5 mg Oral BID  . atorvastatin  40 mg Oral q1800  . carvedilol  3.125 mg Oral BID WC  . CeFEPime   2 g Intravenous Q T,Th,Sat-1800  . clobetasol ointment  1 application Topical BID  . clopidogrel  75 mg Oral Daily  . gabapentin  300 mg Oral QHS  . heparin  2,600 Units Dialysis Once in dialysis  . insulin aspart  0-9 Units Subcutaneous TID WC  . multivitamin   1 tablet Oral Daily  . polyethylene glycol  17 g Oral Daily  . silver sulfADIAZINE  1 application Topical BID  . sodium thiosulfate   25 g Intravenous Q T,Th,Sa-HD  . sucroferric oxyhydroxide  1,000 mg Oral TID WC   Continuous Infusions:    LOS: 7 days    Time spent: 15 minutes  Greater than 50% of the time spent on counseling and coordinating the care.   Leisa Lenz, MD [facility] Pager [phone]  If 7PM-7AM, please contact night-coverage [email or web address] Password [email or web address] 05/01/2016, 3:19 PM

## 2016-05-01 NOTE — Procedures (Signed)
Pt seen on HD.  Ap 170 Vp 170  BFR 400.  He is agreeable to SNF.   His pain is controlled.  He can go from renal standpoint.  Should be on low, 2Ca bath as outpt.

## 2016-05-02 DIAGNOSIS — N4889 Other specified disorders of penis: Secondary | ICD-10-CM | POA: Insufficient documentation

## 2016-05-02 LAB — GLUCOSE, CAPILLARY
GLUCOSE-CAPILLARY: 193 mg/dL — AB (ref 65–99)
GLUCOSE-CAPILLARY: 180 mg/dL — AB (ref 65–99)
Glucose-Capillary: 132 mg/dL — ABNORMAL HIGH (ref 65–99)
Glucose-Capillary: 155 mg/dL — ABNORMAL HIGH (ref 65–99)

## 2016-05-02 MED ORDER — INSULIN ASPART 100 UNIT/ML ~~LOC~~ SOLN: 3.0000 [IU] | Freq: Three times a day (TID) | SUBCUTANEOUS | Status: AC

## 2016-05-02 MED ORDER — NEPRO/CARBSTEADY PO LIQD: 237.0000 mL | Freq: Three times a day (TID) | ORAL | Status: DC | PRN

## 2016-05-02 MED ORDER — PRO-STAT SUGAR FREE PO LIQD
30.0000 mL | Freq: Two times a day (BID) | ORAL | Status: DC
  Administered 2016-05-02: 30 mL via ORAL
  Filled 2016-05-02: qty 30

## 2016-05-02 MED ORDER — PENTAFLUOROPROP-TETRAFLUOROETH EX AERO: 1.0000 "application " | INHALATION_SPRAY | CUTANEOUS | Status: DC | PRN

## 2016-05-02 MED ORDER — LIDOCAINE HCL (PF) 1 % IJ SOLN: 5.0000 mL | INTRAMUSCULAR | Status: DC | PRN

## 2016-05-02 MED ORDER — APIXABAN 5 MG PO TABS: 5.0000 mg | ORAL_TABLET | Freq: Two times a day (BID) | ORAL | Status: AC

## 2016-05-02 MED ORDER — OXYCODONE HCL 5 MG PO TABS
5.0000 mg | ORAL_TABLET | Freq: Once | ORAL | Status: AC
  Administered 2016-05-02: 5 mg via ORAL
  Filled 2016-05-02: qty 1

## 2016-05-02 MED ORDER — SODIUM CHLORIDE 0.9 % IV SOLN: 100.0000 mL | INTRAVENOUS | Status: DC | PRN

## 2016-05-02 MED ORDER — INSULIN ASPART 100 UNIT/ML ~~LOC~~ SOLN: 0.0000 [IU] | Freq: Three times a day (TID) | SUBCUTANEOUS | Status: DC

## 2016-05-02 MED ORDER — NEPRO/CARBSTEADY PO LIQD
237.0000 mL | Freq: Two times a day (BID) | ORAL | Status: DC
  Administered 2016-05-02 (×2): 237 mL via ORAL

## 2016-05-02 MED ORDER — LIDOCAINE-PRILOCAINE 2.5-2.5 % EX CREA: 1.0000 "application " | TOPICAL_CREAM | CUTANEOUS | Status: DC | PRN

## 2016-05-02 MED ORDER — ALTEPLASE 2 MG IJ SOLR
2.0000 mg | Freq: Once | INTRAMUSCULAR | Status: DC | PRN
Start: 2016-05-02 — End: 2016-05-02

## 2016-05-02 MED ORDER — NEPRO/CARBSTEADY PO LIQD: 237.0000 mL | Freq: Two times a day (BID) | ORAL | Status: DC

## 2016-05-02 MED ORDER — FENTANYL 12 MCG/HR TD PT72: 12.5000 ug | MEDICATED_PATCH | TRANSDERMAL | Status: AC

## 2016-05-02 MED ORDER — HEPARIN SODIUM (PORCINE) 1000 UNIT/ML DIALYSIS: 1000.0000 [IU] | INTRAMUSCULAR | Status: DC | PRN

## 2016-05-02 MED ORDER — CARVEDILOL 3.125 MG PO TABS: 3.1250 mg | ORAL_TABLET | Freq: Two times a day (BID) | ORAL | Status: AC

## 2016-05-02 MED ORDER — HEPARIN SODIUM (PORCINE) 1000 UNIT/ML DIALYSIS: 20.0000 [IU]/kg | INTRAMUSCULAR | Status: DC | PRN

## 2016-05-02 NOTE — Progress Notes (Signed)
05/02/2016 5:33 PM  Report called to Caryl Asp, RN at [facility]. Full report given, questions answered.  IV  And telemetry removed.  Belongings packed and sent with patient.    Princella Pellegrini

## 2016-05-02 NOTE — Progress Notes (Signed)
05/02/2016 4:38 PM  Attempted to call report to Caryl Asp, Production assistant, radio at [employer].  No answer at this time, message left requesting return phone call.    ,Princella Pellegrini

## 2016-05-02 NOTE — Discharge Summary (Addendum)
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 Typically dry calciphylaxis wounds are best left underdisturbed. Any dry areas should only be painted with betadine for drying effect and antibacterial effects. Open areas of the penis and LE ok to continue silvadene as previously ordered for antimicrobial effects, however may make draining wounds more moist.   Wound care noted that nephrology recommended evaluation and follow up in the wound care center for HBO treatments. Would agree outpatient wound care center would be the most appropriate place for wound care followup.   Discharge Diagnoses:  Principal Problem:   Acute respiratory failure with hypoxia (HCC) Active Problems:   DM (diabetes mellitus), type 2, uncontrolled, with renal complications (HCC)   CAD (coronary artery disease)   Lower extremity edema   Acute on chronic combined systolic and diastolic heart failure (HCC)   Atrial fibrillation (HCC)   HCAP (healthcare-associated pneumonia)   ESRD (end stage renal disease) on dialysis (HCC)   Leg pain   Hyperkalemia   Hypervolemia   Pleural effusion   Ascites   Leukocytosis   Hypoxia   Calciphylaxis   Pressure ulcer   Palliative care encounter   Leg pain, anterior   Goals of care, counseling/discussion   Left leg pain    Discharge Condition: stable   Diet recommendation: as tolerated   History of present illness:  65 y.o. male with with history of chronic atrial fibrillation (wa previously on [facility] with coumadin), ischemic cardiomyopathy status post ICD placement, CAD status post stenting, diabetes mellitus type 2, ESRD on hemodialysis on Tuesday, Thursday and Saturday and chronic combined systolic and diastolic heart failure  who presented to [facility] ED with increasing pain in his left lower extremity for past few days prior to this admission.  In the ER, patient was found to be febrile, hypoxic and has required BiPAP. Chest x-ray showed airspace opacity and worsening right-sided pleural effusion. Blood work was notable for hyprkalemia forwhich he was given calcium gluconate, sodium bicarbonate and insulin D50. odium bicarbonate and D50.  Patient was admitted for presumed pneumonia and started on empiric antibiotics. Patient transitioned quickly off BiPAP after first hemodialysis session with improvement with volume overload. Patient also noted to likely have left lower extremity and penis calciphylaxis and he was started on IV sodium thiosulfate as well as pain medication. Cardiology was consulted due to elevated troponins felt to be secondary to CHF.   Patient additionally noted to have a nodular liver per CT chest as well as ascites and he underwent paracentesis with 6 L of fluid removed.  Hospital Course:   Assessment & Plan:  Acute respiratory failure with hypoxia secondary to volume overload / probable healthcare associated pneumonia / Acute COPD exacerbation / Leukocytosis / Sepsis ruled out - Sepsis ruled on. On cefepime currently but will be stopped prior to discharge - Volume statu improved, stable to discharge to SNF per nephrology  - 2-D echo from 11/29/2015 with a EF of 15-20% with severe diffuse hypokinesis and moderate to severe pulmonary artery systolic pressure with a PA peak pressure of 65 mmHg.  - Patient was on BiPAP on admission but currently on nasal cannula.  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  - The goal uncorrected calcium for pt 7.0- 8.0. Calcium 9 - Continue Na thiosulfate   Hyperkalemia - In the setting of ESRD - Status post sodium bicarbonate, D50, insulin, calcium gluconate. Also on HD - Potassium WNL  Acute on chronic systolic heart failure - 2-D echo from 11/29/2015 with a EF of 15-20% with severe diffuse hypokinesis and moderate to severe pulmonary artery systolic pressure with a PA peak pressure of 65 mmHg.  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  - Continue sliding scale insulin. - Since CBG's in past 24 hours: 170, 189, 132 will add insulin 3 units TID - Continue gabapentin   Dyslipidemia associated with type 2 DM -Continue atorvastatin 40 mg at bedtime   Coronary artery disease/elevated troponin - Elevated troponin likely secondary to CHF.  - Continue apixaban, plavix - Continue statin therapy  Right pleural effusion - Patient with chronic right pleural effusion. - Case discussed with Dr. Chase Caller of critical care who reviewed the films, per CCM pleural effusion likely secondary to patient's end-stage renal disease and chronic systolic heart failure ( EF 15-20%) and at this time to continue current management with hemodialysis for volume overload.  Nodular contour of liver /Ascites - Status post paracentesis with 6 L of fluid removed 04/26/2016  Overweight - Seen by nutritionist - Meets criteria for overweight - Body mass index is 27.48 kg/(m^2).    DVT prophylaxis: on full dose AC  with apixaban  Code Status: full code  Family Communication: no family at the bedside this am, left VM on pt son cell with my cell number to call me back for updates      Consultants:   Nephrology: Dr. Lorrene Reid 04/24/2016  Cardiology: Dr. Aundra Dubin 04/24/2016  Palliative care  Procedures:   Lower extremity Dopplers 04/24/2016  Ultrasound guided Paracentesis with 6 L of tan chylous colored fluid removed 04/25/2016  Antimicrobials:   IV cefepime 04/24/2016  IV vancomycin 04/24/2016 --> 04/28/2016   Signed:  Leisa Lenz, MD  [facility] 05/02/2016, 9:06 AM  Pager #: [phone]  Time spent in minutes: more than 30 minutes    Discharge Exam: Filed Vitals:   05/01/16 2003 05/02/16 0537  BP: 105/42 103/50  Pulse: 73 80  Temp: 98.2 F (36.8 C) 98.5 F (36.9 C)  Resp: 17 18   Filed Vitals:   05/01/16 1151 05/01/16 1625 05/01/16 2003 05/02/16 0537  BP: 136/56 98/40 105/42 103/50  Pulse: 73 90 73 80  Temp: 98.1 F (36.7 C) 99.8 F (37.7 C) 98.2 F (36.8 C) 98.5 F (36.9 C)  TempSrc: Oral Oral Oral Oral  Resp: 17 18 17 18   Height:      Weight:    72.6 kg (160 lb 0.9 oz)  SpO2: 100% 91% 92% 94%  General: Pt is alert, follows commands appropriately, not in acute distress Cardiovascular: Regular rate and rhythm, S1/S2 + Respiratory: Clear to auscultation bilaterally, no wheezing, no crackles, no rhonchi Abdominal: Soft, non tender, non distended, bowel sounds +, no guarding Extremities: nLE calciphylaxis, palpable pulses  Neuro: Grossly nonfocal  Discharge Instructions  Discharge Instructions    Call MD for:  difficulty breathing, headache or visual disturbances    Complete by:  As directed      Call MD for:  persistant dizziness or light-headedness    Complete by:  As directed      Call MD for:  redness, tenderness, or signs of infection (pain, swelling, redness, odor or green/yellow discharge around incision site)    Complete by:  As directed       Call MD for:  severe uncontrolled pain    Complete by:  As directed      Diet - low sodium heart healthy    Complete by:  As directed      Increase activity slowly    Complete by:  As directed             Medication List    STOP taking these medications        insulin lispro 100 UNIT/ML injection  Commonly known as:  HUMALOG     traMADol 50 MG tablet  Commonly known as:  ULTRAM     warfarin 5 MG tablet  Commonly known as:  COUMADIN      TAKE these medications        acetaminophen 325 MG tablet  Commonly known as:  TYLENOL  Take 650 mg by mouth every 8 (eight) hours as needed for headache (pain).     albuterol 108 (90 Base) MCG/ACT inhaler  Commonly known as:  PROVENTIL HFA;VENTOLIN HFA  Inhale 2 puffs into the lungs every 6 (six) hours as needed for wheezing or shortness of breath.     albuterol (2.5 MG/3ML) 0.083% nebulizer solution  Commonly known as:  PROVENTIL  Take 2.5 mg by nebulization every 6 (six) hours as needed for wheezing or shortness of breath.     amiodarone 200 MG tablet  Commonly known as:  PACERONE  Take 0.5 tablets (100 mg total) by mouth daily.     apixaban 5 MG Tabs tablet  Commonly known as:  ELIQUIS  Take 1 tablet (5 mg total) by mouth 2 (two) times daily.     atorvastatin 40 MG tablet  Commonly known as:  LIPITOR  TAKE ONE TABLET BY MOUTH ONCE DAILY     carvedilol 3.125 MG tablet  Commonly known as:  COREG  Take 1 tablet (3.125 mg total) by mouth 2 (two) times daily with a meal.     clobetasol ointment 0.05 %  Commonly known as:  TEMOVATE  Apply 1 application topically 2 (two) times daily.     clopidogrel 75 MG tablet  Commonly known as:  PLAVIX  TAKE ONE TABLET BY MOUTH ONCE DAILY     docusate sodium 100 MG capsule  Commonly known as:  COLACE  Take 100 mg by mouth daily as needed (constipation).     DULERA 200-5 MCG/ACT Aero  Generic drug:  mometasone-formoterol  Inhale 2 puffs into the lungs 2 (two) times daily as  needed for wheezing or shortness of breath.     feeding supplement (NEPRO CARB STEADY) Liqd  Take 237 mLs by mouth 3 (three) times daily as needed (Supplement).     feeding supplement (  NEPRO CARB STEADY) Liqd  Take 237 mLs by mouth 2 (two) times daily between meals.     fentaNYL 12 MCG/HR  Commonly known as:  DURAGESIC - dosed mcg/hr  Place 1 patch (12.5 mcg total) onto the skin every 3 (three) days.     gabapentin 300 MG capsule  Commonly known as:  NEURONTIN  Take 300 mg by mouth at bedtime.     insulin aspart 100 UNIT/ML injection  Commonly known as:  novoLOG  Inject 0-9 Units into the skin 3 (three) times daily with meals.     insulin aspart 100 UNIT/ML injection  Commonly known as:  NOVOLOG  Inject 3 Units into the skin 3 (three) times daily with meals.     multivitamin with minerals Tabs tablet  Take 1 tablet by mouth daily.     mupirocin ointment 2 %  Commonly known as:  BACTROBAN  Place 1 application into the nose 2 (two) times daily.     nitroGLYCERIN 0.4 MG SL tablet  Commonly known as:  NITROSTAT  Place 1 tablet (0.4 mg total) under the tongue every 5 (five) minutes as needed for chest pain.     silver sulfADIAZINE 1 % cream  Commonly known as:  SILVADENE  Apply 1 application topically 2 (two) times daily.            Follow-up Information    Follow up with PERROTT, Renato Gails, MD. Schedule an appointment as soon as possible for a visit in 1 week.   Specialty:  Family Medicine   Why:  Follow up appt after recent hospitalization   Contact information:   [place] [address] [phone]        The results of significant diagnostics from this hospitalization (including imaging, microbiology, ancillary and laboratory) are listed below for reference.    Significant Diagnostic Studies: Dg Chest 2 View  04/27/2016  CLINICAL DATA:  Pleural effusion; Pt reports cough "for a while"; he denies CP or SOB; h/o ICD insertion, HTN, and DM;  non-smoker EXAM: CHEST - 2 VIEW COMPARISON:  04/25/2016 FINDINGS: Moderate right and smaller left pleural effusions. Adjacent consolidation/ atelectasis in the lower lobes. Some improvement in right mid lung aeration. Improvement in interstitial edema. Stable tunneled right IJ hemodialysis catheter, and left subclavian AICD. Cardiomegaly with extensive coronary calcifications. Atheromatous aorta. Visualized bones unremarkable. IMPRESSION: 1. Interval improvement in interstitial edema and right midlung infiltrate 2. Persistent pleural effusions and bibasilar atelectasis/consolidation. 3.  Aortic Atherosclerosis (ICD10-170.0) Electronically Signed   By: Lucrezia Europe M.D.   On: 04/27/2016 10:41   Dg Chest 2 View  04/24/2016  CLINICAL DATA:  Dyspnea.  Lower extremity pain and swelling. EXAM: CHEST  2 VIEW COMPARISON:  04/17/2016 FINDINGS: Dual-lumen right jugular central line extending to the cavoatrial junction. Intact transvenous cardiac leads. Unchanged cardiomegaly. Slightly enlarged right pleural effusion. Unchanged small left effusion. Mild airspace opacity in the central right lung and in the right lung base, worsened. This may represent asymmetric alveolar edema. Infectious infiltrate not excluded. IMPRESSION: Mildly worsened central and basilar right lung opacities. This could represent asymmetric alveolar edema or infectious infiltrate. Right pleural effusion is slightly enlarged from 04/17/2016. Electronically Signed   By: Andreas Newport M.D.   On: 04/24/2016 01:53   Dg Chest 2 View  04/17/2016  CLINICAL DATA:  Asthma.  History of MI, CHF. EXAM: CHEST  2 VIEW COMPARISON:  03/14/2016. FINDINGS: Dual-lumen right IJ catheter in stable position. Cardiac pacer stable position. Stable  cardiomegaly. Coronary artery calcification. Low lung volumes with bibasilar atelectasis and/or mild infiltrates. Small bilateral pleural effusions. IMPRESSION: 1. Dual-lumen right IJ catheter in stable position . 2. Cardiac  pacer in stable position. Coronary artery disease. Cardiomegaly with bibasilar atelectasis and/or infiltrates and bilateral pleural effusions unchanged. Electronically Signed   By: Marcello Moores  Register   On: 04/17/2016 09:54   Ct Chest Wo Contrast  04/24/2016  CLINICAL DATA:  Further evaluate pleural effusion. Cough. History of MI in 2016. History of hypertension. EXAM: CT CHEST WITHOUT CONTRAST TECHNIQUE: Multidetector CT imaging of the chest was performed following the standard protocol without IV contrast. COMPARISON:  04/24/2016 FINDINGS: Cardiovascular: Extensive coronary artery calcifications are present. Heart is mildly enlarged. No pericardial effusion. Left-sided AICD leads extend into the right atrium and right ventricle. A a right-sided dialysis catheter tip extends to the level of the SVC right atrial junction. Mediastinum/Nodes: The visualized portion of the thyroid gland has a normal appearance. Small mediastinal lymph nodes are present. Largest is identified in the region the precarinal station, measuring approximate 1.0 cm. Lungs/Pleura: There are bilateral pleural effusions, right greater than left. Bibasilar atelectasis is present. Parenchymal calcifications are identified within the lung bases, right greater than left likely representing calcified granulomata. No suspicious pulmonary nodules are identified. There is smooth septal thickening in the lungs, consistent with mild pulmonary edema. Upper Abdomen: Nodular contour of the liver raising the question of cirrhosis. Calcified granulomata are identified within the liver. There is dense atherosclerotic calcification of the abdominal aorta and its branches. Ascites. Calcified gallstone or gallstones. Musculoskeletal: Mild mid thoracic spondylosis. No suspicious lytic or blastic lesions are identified. IMPRESSION: 1. Small to moderate right pleural effusion. 2. Small left pleural effusion. 3. Bibasilar atelectasis. 4. Coronary artery disease and  mild cardiomegaly. 5. Catheters and pacemaker as described. 6. Abdominal ascites and probable cirrhosis. 7. Cholelithiasis. Electronically Signed   By: Nolon Nations M.D.   On: 04/24/2016 15:17   US Abdomen Limited  04/24/2016  CLINICAL DATA:  Abdominal distention for 1 year. Evaluate for ascites. EXAM: LIMITED ABDOMEN ULTRASOUND FOR ASCITES TECHNIQUE: Limited ultrasound survey for ascites was performed in all four abdominal quadrants. COMPARISON:  11/21/2015 CT FINDINGS: Ultrasound is performed of the 4 quadrants, demonstrating moderate ascites throughout the abdomen and pelvis. IMPRESSION: Moderate ascites. Electronically Signed   By: Nolon Nations M.D.   On: 04/24/2016 15:29   US Paracentesis  04/26/2016  INDICATION: 65 year old with a history of congestive heart failure as well as end-stage renal disease on hemodialysis. He has developed abdominal ascites and a request is made for diagnostic and therapeutic paracentesis. EXAM: ULTRASOUND GUIDED DIAGNOSTIC AND THERAPEUTIC PARACENTESIS MEDICATIONS: 1% lidocaine COMPLICATIONS: None immediate. PROCEDURE: Informed written consent was obtained from the patient after a discussion of the risks, benefits and alternatives to treatment. A timeout was performed prior to the initiation of the procedure. Initial ultrasound scanning demonstrates a large amount of ascites within the right upper abdominal quadrant. The right upper abdomen was prepped and draped in the usual sterile fashion. 1% lidocaine was used for local anesthesia. Following this, a 19 gauge, 7-cm, Yueh catheter was introduced. An ultrasound image was saved for documentation purposes. The paracentesis was performed. The catheter was removed and a dressing was applied. The patient tolerated the procedure well without immediate post procedural complication. FINDINGS: A total of approximately 6 L of tan chylous colored fluid was removed. Samples were sent to the laboratory as requested by the clinical  team. IMPRESSION: Successful ultrasound-guided paracentesis yielding  6 liters of peritoneal fluid. We stopped at 6 L as this is a first time paracentesis maximum. Read by: Saverio Danker, PA-C Electronically Signed   By: Lucrezia Europe M.D.   On: 04/25/2016 16:46   Dg Chest Port 1 View  04/25/2016  CLINICAL DATA:  Cough, shortness of Breath EXAM: PORTABLE CHEST 1 VIEW COMPARISON:  04/24/2016 FINDINGS: There is moderate partially loculated right pleural effusion. Atelectasis or infiltrate right lower lobe. No pulmonary edema. Dual lead cardiac pacemaker with leads in right atrium and right ventricle. Right IJ dialysis catheter with tip in distal SVC. Small left pleural effusion with left basilar atelectasis. IMPRESSION: Moderate partial loculated right pleural effusion, atelectasis or infiltrate in right lower lobe. No pulmonary edema. Small left pleural effusion with left basilar atelectasis. Electronically Signed   By: Lahoma Crocker M.D.   On: 04/25/2016 14:41   Dg Chest Port 1 View  04/24/2016  CLINICAL DATA:  Dyspnea and weakness EXAM: PORTABLE CHEST 1 VIEW COMPARISON:  04/24/2016 FINDINGS: Intact transvenous cardiac leads. Dual-lumen right jugular central line extends to the cavoatrial junction. Moderate cardiomegaly persists, unchanged. Moderate right pleural effusion persists, unchanged or mildly larger. Central and basilar right lung opacities have mildly worsened. IMPRESSION: Mild worsening of right central airspace opacity and probable continued enlargement of right pleural effusion. This likely represents congestive heart failure with asymmetric alveolar edema. Electronically Signed   By: Andreas Newport M.D.   On: 04/24/2016 03:28   Dg Tibia/fibula Left Port  04/24/2016  CLINICAL DATA:  Left lower extremity pain and swelling for 2 days. No trauma. EXAM: PORTABLE LEFT TIBIA AND FIBULA - 2 VIEW COMPARISON:  None. FINDINGS: Negative for fracture dislocation. There is no bone lesion or bony destruction.  There are extensive vascular calcifications. No soft tissue gas or foreign body IMPRESSION: Negative Electronically Signed   By: Andreas Newport M.D.   On: 04/24/2016 03:55    Microbiology: Recent Results (from the past 240 hour(s))  Culture, blood (Routine X 2) w Reflex to ID Panel     Status: None   Collection Time: 04/24/16  4:20 AM  Result Value Ref Range Status   Specimen Description BLOOD LEFT ARM  Final   Special Requests AEROBIC BOTTLE ONLY 5ML  Final   Culture NO GROWTH 5 DAYS  Final   Report Status 04/29/2016 FINAL  Final  Culture, blood (Routine X 2) w Reflex to ID Panel     Status: None   Collection Time: 04/24/16  4:25 AM  Result Value Ref Range Status   Specimen Description BLOOD LEFT HAND  Final   Special Requests AEROBIC BOTTLE ONLY 5ML  Final   Culture NO GROWTH 5 DAYS  Final   Report Status 04/29/2016 FINAL  Final  MRSA PCR Screening     Status: None   Collection Time: 04/24/16  5:05 AM  Result Value Ref Range Status   MRSA by PCR NEGATIVE NEGATIVE Final    Comment:        The GeneXpert MRSA Assay (FDA approved for NASAL specimens only), is one component of a comprehensive MRSA colonization surveillance program. It is not intended to diagnose MRSA infection nor to guide or monitor treatment for MRSA infections.   Gram stain     Status: None   Collection Time: 04/25/16  4:24 PM  Result Value Ref Range Status   Specimen Description PERITONEAL FLUID  Final   Special Requests NONE  Final   Gram Stain   Final  RARE WBC PRESENT, PREDOMINANTLY MONONUCLEAR NO ORGANISMS SEEN    Report Status 04/25/2016 FINAL  Final  Culture, body fluid-bottle     Status: None   Collection Time: 04/25/16  4:24 PM  Result Value Ref Range Status   Specimen Description FLUID PERITONEAL  Final   Special Requests BOTTLES DRAWN AEROBIC AND ANAEROBIC 10CC  Final   Culture NO GROWTH 5 DAYS  Final   Report Status 04/30/2016 FINAL  Final     Labs: Basic Metabolic  Panel:  Recent Labs Lab 04/26/16 0527 04/27/16 0623 04/28/16 0347 04/29/16 0511 04/30/16 0619  NA 133* 129* 130* 132* 131*  K 4.6 5.4* 5.7* 5.1 4.8  CL 95* 90* 85* 96* 94*  CO2 24 22 20* 22 25  GLUCOSE 145* 145* 159* 132* 170*  BUN 37* 59* 80* 45* 37*  CREATININE 4.45* 6.20* 7.40* 5.22* 4.35*  CALCIUM 9.5 9.3 10.2 8.7* 9.0  PHOS 5.5* 6.6* 7.7* 6.0* 5.4*   Liver Function Tests:  Recent Labs Lab 04/25/16 1000 04/26/16 0527 04/27/16 0623 04/28/16 0347 04/29/16 0511 04/30/16 0619  AST 26  --  23  --   --   --   ALT 19  --  17  --   --   --   ALKPHOS 137*  --  151*  --   --   --   BILITOT 1.7*  --  1.3*  --   --   --   PROT 7.6  --  6.6  --   --   --   ALBUMIN 2.9* 2.7* 2.5*  2.5* 2.5* 2.5* 2.3*   No results for input(s): LIPASE, AMYLASE in the last 168 hours. No results for input(s): AMMONIA in the last 168 hours. CBC:  Recent Labs Lab 04/26/16 0527 04/27/16 0623 04/28/16 0347 04/29/16 0511 04/30/16 0609  WBC 13.5* 12.9* 13.9* 12.3* 13.8*  NEUTROABS 11.3* 10.6*  --   --   --   HGB 10.1* 10.2* 10.0* 9.8* 10.4*  HCT 31.6* 31.6* 30.9* 30.6* 33.1*  MCV 100.0 99.1 97.8 98.4 103.1*  PLT 146* 154 146* 156 132*   Cardiac Enzymes: No results for input(s): CKTOTAL, CKMB, CKMBINDEX, TROPONINI in the last 168 hours. BNP: BNP (last 3 results)  Recent Labs  01/10/16 2006 04/24/16 0501  BNP 4360.3* >4500.0*    ProBNP (last 3 results) No results for input(s): PROBNP in the last 8760 hours.  CBG:  Recent Labs Lab 05/01/16 1150 05/01/16 1623 05/01/16 2001 05/01/16 2131 05/02/16 0753  GLUCAP 98 196* 170* 189* 132*

## 2016-05-02 NOTE — Care Management Important Message (Signed)
Important Message  Patient Details  Name: Tony Freeman MRN: [id number] Date of Birth: October 21, 1951   Medicare Important Message Given:  Yes    [name] 05/02/2016, 10:38 AM

## 2016-05-02 NOTE — Progress Notes (Signed)
Upon my assessment of patient noticed foam dressing to right pinky finger that has blood on it.This nurse asked patient what happened to his finger.Patient stated," My son [name]  came to see me today and decided to cut my fingers nails and he cut my nail down to far and it started bleeding.He put one of those dressing on it."This nurse will leave message for doctor to assess in morning.

## 2016-05-02 NOTE — Consult Note (Signed)
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 Typically dry calciphylaxis wounds are best left underdisturbed. Discussed consult with bedside nurse 05/01/16 and agree that any dry areas should only be painted with betadine for drying effect and antibacterial effects.  Open areas of the penis and LE ok to continue silvadene as previously ordered for antimicrobial effects, however may make draining wounds more moist.    Noted that Nephrology recommended evaluation and follow up in the wound care center for HBO treatments.  Would agree outpatient wound care center would be the most appropriate place for wound care followup.  Discussed POC with patient and bedside nurse.  Re consult if needed, will not follow at this time. Thanks  Tayon Parekh [employer], RN,CNS, [place] [phone])

## 2016-05-02 NOTE — NC FL2 (Signed)
[place] LEVEL OF CARE SCREENING TOOL     IDENTIFICATION  Patient Name: Tony Freeman Birthdate: 08/22/1951 Sex: male Admission Date (Current Location): 04/24/2016  [facility] and [place] Number:  Herbalist and Address:  [facility], [place] [address], [place], [place] [address]      Provider Number: [id number]  Attending Physician Name and Address:  Robbie Lis, MD  Relative Name and Phone Number:  Abubaker Errington - son.  Phone [phone]    Current Level of Care: Hospital Recommended Level of Care: [place] Prior Approval Number:    Date Approved/Denied:   PASRR Number: [id number] (Eff. 06/03/13)  Discharge Plan: SNF    Current Diagnoses: Patient Active Problem List   Diagnosis Date Noted  . Left leg pain   . Palliative care encounter   . Leg pain, anterior   . Goals of care, counseling/discussion   . Pressure ulcer 04/27/2016  . Hypoxia   . Calciphylaxis   . Ascites 04/25/2016  . Leukocytosis 04/25/2016  . Acute respiratory failure with hypoxia ([place]) 04/24/2016  . Leg pain 04/24/2016  . Hyperkalemia   . Hypervolemia   . Pleural effusion   . Pleural effusion on right 03/03/2016  . Calcification of soft tissue   . HCAP (healthcare-associated pneumonia) 01/10/2016  . Ulcer of penis 01/10/2016  . ESRD (end stage renal disease) on dialysis ([place])   . Chronic kidney disease, stage V ([place]) 11/19/2015  . Encounter for therapeutic drug monitoring 06/25/2015  . Atrial fibrillation, unspecified 06/19/2015  . Chronic systolic heart failure ([place]) 05/25/2015  . HLD (hyperlipidemia) 05/25/2015  . CN (constipation) 05/25/2015  . Protein-calorie malnutrition ([place]) 05/25/2015  . ESRD (end stage renal disease) ([place]) 05/24/2015  . Neuropathy ([place]) 05/24/2015  . Atrial fibrillation ([place]) 05/24/2015  . Anemia of chronic disease 02/14/2015  . Hypercalcemia 01/24/2015  . Acute bronchitis 01/24/2015  .  Syncope 07/20/2014  . Lower extremity edema 07/16/2014  . Hypoglycemia 07/16/2014  . Acute renal failure superimposed on stage 4 chronic kidney disease ([place]) 07/16/2014  . Acute on chronic combined systolic and diastolic heart failure ([place]) 07/16/2014  . Chronic kidney disease (CKD), stage IV (severe) ([place]) 04/04/2014  . DM (diabetes mellitus), type 2, uncontrolled, with renal complications ([place]) 0000000  . Essential hypertension, benign 04/04/2014  . Chronic combined systolic and diastolic CHF (congestive heart failure) ([place]) 04/04/2014  . CAD (coronary artery disease) 04/04/2014  . Other and unspecified hyperlipidemia 04/04/2014  . Abnormal renal ultrasound with left renal lesion and bladder wall thickening 04/04/2014  . Microscopic hematuria 04/04/2014  . Abnormal brain CT/age indeterminate lacunar infarct 04/04/2014  . PAF (paroxysmal atrial fibrillation) ([place]) 04/04/2014  . Hyperglycemia 04/02/2014  . Sepsis ([place]) 04/02/2014  . Encephalopathy 04/02/2014  . Elevated troponin 04/02/2014  . Renal failure 04/02/2014    Orientation RESPIRATION BLADDER Height & Weight     Self, Time, Situation, Place  Normal Continent Weight: 160 lb 0.9 oz (72.6 kg) Height:  5\' 6"  (167.6 cm)  BEHAVIORAL SYMPTOMS/MOOD NEUROLOGICAL BOWEL NUTRITION STATUS      Continent Diet (Low sodium - Heart healthy)  AMBULATORY STATUS COMMUNICATION OF NEEDS Skin   Extensive Assist Verbally Other (Comment) (Stage 2 pressure ulcers to left/right legs. 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)                       Personal Care Assistance Level of Assistance  Bathing, Feeding, Dressing Bathing Assistance: Maximum assistance Feeding  assistance: Independent (Needs assistance with set-up) Dressing Assistance: Maximum assistance     Functional Limitations Info  Sight, Hearing Sight Info: Adequate Hearing Info: Adequate Speech Info: Adequate    SPECIAL CARE FACTORS FREQUENCY  PT (By licensed PT), OT (By licensed OT)      PT Frequency: Evaluated 7/4 and a minimum of 3X per week therapy recommended OT Frequency: Evaluated 7/4 and a minimum of 2X per week therapy recommended            Contractures Contractures Info: Not present    Additional Factors Info  Code Status, Allergies, Insulin Sliding Scale Code Status Info: Full Allergies Info: No known allergies   Insulin Sliding Scale Info: Insulin 0-9 Units 3X per day with meals       Current Medications (05/02/2016):  This is the current hospital active medication list Current Facility-Administered Medications  Medication Dose Route Frequency Provider Last Rate Last Dose  . 0.9 %  sodium chloride infusion  100 mL Intravenous PRN Roney Jaffe, MD      . 0.9 %  sodium chloride infusion  100 mL Intravenous PRN Roney Jaffe, MD      . acetaminophen (TYLENOL) tablet 650 mg  650 mg Oral Q6H PRN Rise Patience, MD   650 mg at 04/24/16 0747   Or  . acetaminophen (TYLENOL) suppository 650 mg  650 mg Rectal Q6H PRN Rise Patience, MD      . albuterol (PROVENTIL) (2.5 MG/3ML) 0.083% nebulizer solution 2.5 mg  2.5 mg Nebulization Q6H PRN Rise Patience, MD      . amiodarone (PACERONE) tablet 100 mg  100 mg Oral Daily Rise Patience, MD   100 mg at 05/01/16 1259  . apixaban (ELIQUIS) tablet 5 mg  5 mg Oral BID Roney Jaffe, MD   5 mg at 05/01/16 2054  . atorvastatin (LIPITOR) tablet 40 mg  40 mg Oral q1800 Rise Patience, MD   40 mg at 05/01/16 1707  . camphor-menthol (SARNA) lotion 1 application  1 application Topical [id number] PRN Eugenie Filler, MD       And  . hydrOXYzine (ATARAX/VISTARIL) tablet 25 mg  25 mg Oral Q8H PRN Eugenie Filler, MD      . carvedilol (COREG) tablet 3.125 mg  3.125 mg Oral BID WC Amy D Clegg, NP   3.125 mg at 05/02/16 0846  . ceFEPIme (MAXIPIME) 2 g in dextrose 5 % 50 mL IVPB  2 g Intravenous Q T,Th,Sat-1800 Assunta Found Stone, RPH   2 g at 05/01/16 1707  . clobetasol ointment (TEMOVATE) [id number] % 1 application   1 application Topical BID Rise Patience, MD   1 application at [id number]  . clopidogrel (PLAVIX) tablet 75 mg  75 mg Oral Daily Rise Patience, MD   75 mg at 05/01/16 1300  . docusate sodium (COLACE) capsule 100 mg  100 mg Oral Daily PRN Rise Patience, MD   100 mg at 04/29/16 2248  . docusate sodium (ENEMEEZ) enema 283 mg  1 enema Rectal PRN Eugenie Filler, MD      . feeding supplement (NEPRO CARB STEADY) liquid 237 mL  237 mL Oral TID PRN Eugenie Filler, MD      . feeding supplement (NEPRO CARB STEADY) liquid 237 mL  237 mL Oral BID BM Alric Seton, PA-C      . feeding supplement (PRO-STAT SUGAR FREE 64) liquid 30 mL  30 mL Oral BID Alric Seton, PA-C      .  fentaNYL (DURAGESIC - dosed mcg/hr) 12.5 mcg  12.5 mcg Transdermal Q72H Marianne L York, PA-C   12.5 mcg at 05/01/16 1534  . fentaNYL (SUBLIMAZE) injection 12.5 mcg  12.5 mcg Intravenous Q2H PRN Melton Alar, PA-C   12.5 mcg at 05/01/16 2356  . gabapentin (NEURONTIN) capsule 300 mg  300 mg Oral QHS Rise Patience, MD   300 mg at 05/01/16 2054  . heparin injection 2,600 Units  2,600 Units Dialysis Once in dialysis Roney Jaffe, MD      . insulin aspart (novoLOG) injection 0-9 Units  0-9 Units Subcutaneous TID WC Rise Patience, MD   1 Units at 05/02/16 [phone]  . mometasone-formoterol (DULERA) 200-5 MCG/ACT inhaler 2 puff  2 puff Inhalation BID PRN Rise Patience, MD      . multivitamin with minerals tablet 1 tablet  1 tablet Oral Daily Rise Patience, MD   1 tablet at 05/01/16 1300  . nitroGLYCERIN (NITROSTAT) SL tablet 0.4 mg  0.4 mg Sublingual Q5 min PRN Rise Patience, MD      . ondansetron [facility]) tablet 4 mg  4 mg Oral Q6H PRN Eugenie Filler, MD       Or  . ondansetron [facility]) injection 4 mg  4 mg Intravenous Q6H PRN Eugenie Filler, MD   4 mg at 04/28/16 1835  . polyethylene glycol (MIRALAX / GLYCOLAX) packet 17 g  17 g Oral Daily Roney Jaffe, MD   17 g at 05/01/16 1300   . silver sulfADIAZINE (SILVADENE) 1 % cream 1 application  1 application Topical BID Robbie Lis, MD   1 application at [id number]  . sodium chloride flush (NS) 0.9 % injection 3 mL  3 mL Intravenous Q12H Rise Patience, MD   3 mL at 05/01/16 2057  . sodium thiosulfate 25 g in sodium chloride 0.9 % 200 mL Infusion for Calciphylaxis  25 g Intravenous Q T,Th,Sa-HD Jamal Maes, MD   25 g at 05/01/16 1200  . sorbitol 70 % solution 30 mL  30 mL Oral PRN Eugenie Filler, MD   30 mL at 04/28/16 0907  . sucroferric oxyhydroxide (VELPHORO) chewable tablet 1,000 mg  1,000 mg Oral TID WC Ernest Haber, PA-C   1,000 mg at 05/02/16 0847  . zolpidem (AMBIEN) tablet 5 mg  5 mg Oral QHS PRN Eugenie Filler, MD   5 mg at 04/29/16 2248   Facility-Administered Medications Ordered in Other Encounters  Medication Dose Route Frequency Provider Last Rate Last Dose  . 0.9 %  sodium chloride infusion   Intravenous Continuous Angelia Mould, MD   Stopped at 02/12/16 [phone]  . Chlorhexidine Gluconate Cloth 2 % PADS 6 each  6 each Topical Once Angelia Mould, MD      . insulin aspart (novoLOG) injection 10 Units  10 Units Intravenous Once Angelia Mould, MD         Discharge Medications: Please see discharge summary for a list of discharge medications.  Relevant Imaging Results:  Relevant Lab Results:   Additional Information ss# [id number]. Dialysis patient at [facility], Mila Homer, LCSW

## 2016-05-02 NOTE — Clinical Social Work Note (Signed)
Clinical Social Work Assessment  Patient Details  Name: Tony Freeman MRN: [id number] Date of Birth: [birth year]  Date of referral:  05/01/16               Reason for consult:  Facility Placement                Permission sought to share information with:  Family Supports Permission granted to share information::  Yes, Verbal Permission Granted  Name::     Tony Freeman  Agency::     Relationship::  Son  Contact Information:  [phone]  Housing/Transportation Living arrangements for the past 2 months:  Apartment Source of Information:  Adult Children (Patient allowed son to take the lead in talking with CSW) Patient Interpreter Needed:  None Criminal Activity/Legal Involvement Pertinent to Current Situation/Hospitalization:  No - Comment as needed Significant Relationships:  Adult Children, Other Family Members Lives with:  Adult Children (Patient lives with son Tony Freeman) Do you feel safe going back to the place where you live?  No (Patient and son expressed understanding and agreement that rehab needed before patient returns home.) Need for family participation in patient care:  Yes (Comment)  Care giving concerns:  Son expressed awareness of patient's need for ST rehab and nursing care. At one point during the day while working on placement at [employer], son was considering taking patient home if needed rather than place him at another facility and he indicated that he could do all the things for his dad that the nurses do for him if needed.  Social Worker assessment / plan:  CSW talked with patient and son, Tony Freeman regarding discharge planning and recommendation of ST rehab. Patient was in bed and was alert and oriented and mostly listened to conversation between [place] and his son. Patient's son very pleasant and engaged with CSW, and supportive and involved with his father's care. He requested [facility] for his father as patient has been to facility before,  son pleased with the care at that facility, and it is very near their home. CSW talked with son regarding need for placement today at a facility that will work with hospital on accepting patient today because of insurance authorization, however son was very pleasant but adamant that patient would only discharge to [facility]. Tony Freeman was had between son and admissions Mudlogger at [facility] regarding possible private pay or taking patient home until facility got authorization.   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  Patient/Family's Understanding of and Emotional Response to Diagnosis, Current Treatment, and Prognosis:  Not discussed.  Emotional Assessment Appearance:  Appears older than stated age Attitude/Demeanor/Rapport:  Other (Appropriate) Affect (typically observed):  Pleasant, Appropriate, Quiet (Patient did interact minimally with CSW and allowed son to take the lead in conversation) Orientation:  Oriented to Self, Oriented to Place, Oriented to  Time, Oriented to Situation Alcohol / Substance use:  Tobacco Use, Alcohol Use, Illicit Drugs (Patient reported that he has never smoked and does not drink or use illicit drugs) Psych involvement (Current and /or in the community):  No (Comment)  Discharge Needs  Concerns to be addressed:  Discharge Planning Concerns Readmission within  the last 30 days:  No Current discharge risk:  None Barriers to Discharge:  No Barriers Identified   Sable Feil, LCSW 05/02/2016, 4:55 PM

## 2016-05-02 NOTE — Progress Notes (Signed)
Pharmacy Antibiotic Note Tony Freeman is a 65 y.o. male with ESRD on HD admitted on 04/24/2016 LLE pain and SOB. Currently on day 9 of Cefepime for treatment of potential HCAP. Current IHD schedule is currently TTS.      Plan: - Continue Cefepime 2 grams qHD (TTS) - F/u HD schedule and need for adjustment of further cefepime doses  - Monitor C&S, CBC and length of therapy  Height: 5\' 6"  (167.6 cm) Weight: 160 lb 0.9 oz (72.6 kg) IBW/kg (Calculated) : 63.8  Temp (24hrs), Avg:98.6 F (37 C), Min:98.1 F (36.7 C), Max:99.8 F (37.7 C)   Recent Labs Lab 04/26/16 0527 04/27/16 0623 04/28/16 0347 04/29/16 0511 04/30/16 0609 04/30/16 0619  WBC 13.5* 12.9* 13.9* 12.3* 13.8*  --   CREATININE 4.45* 6.20* 7.40* 5.22*  --  4.35*    Estimated Creatinine Clearance: 15.7 mL/min (by C-G formula based on Cr of 4.35).    No Known Allergies  Antimicrobials this admission: Vanc 6/29 >> 7/3   Cefepime 6/29 >>   Microbiology results: 6/29 BCx >> ngF 6/29 MRSA PCR >> neg  6/30 Peritoneal fluid >> ngF  Thank you for allowing pharmacy to be a part of this patient's care.  Vincenza Hews, PharmD, BCPS 05/02/2016, 11:27 AM Pager: [phone]

## 2016-05-02 NOTE — Progress Notes (Addendum)
Daily Progress Note   Patient Name: Tony Freeman       Date: 05/02/2016 DOB: [birth year]  Age: 65 y.o. MRN#: [id number] Attending Physician: Robbie Lis, MD Primary Care Physician: Tanya Nones, MD Admit Date: 04/24/2016  Reason for Consultation/Follow-up: Establishing goals of care and Pain control  Subjective:  Patient states fentanyl patch is helping but he is still in severe pain (both on his left leg and penis) where the black marks (calciphylaxis?) are.  He requests additional medication.  I discussed this with the RN.  If he requires multiple PRN doses he will probably do better with a 25 mcg patch rather than a 12.5 mcg.  If he is still here tomorrow PMT will reassess pain medications.  Son brings in completed Living Will and HCPOA.  I placed order for chaplain to notarize. Son [facility] [place]) is HCPOA.  The patient chooses to remain a full code.  He wants to be resuscitated in all of the proposed situations.   He does not want a feeding tube.  He has given [name] the discretion not to follow the living will.  [name] asked me to fax additional information to Dr. Crista Luria of Dermatology - which I did thru Epic. [name] expresses concern about d/c.  He wants his father to go to [facility] as they have had a good experience there before and his father is familiar with that SNF.  I referred his concerns to CSW.     Length of Stay: 8  Current Medications: Scheduled Meds:  . amiodarone  100 mg Oral Daily  . apixaban  5 mg Oral BID  . atorvastatin  40 mg Oral q1800  . carvedilol  3.125 mg Oral BID WC  . ceFEPime (MAXIPIME) IV  2 g Intravenous Q T,Th,Sat-1800  . clobetasol ointment  1 application Topical BID  . clopidogrel  75 mg Oral Daily  .  feeding supplement (NEPRO CARB STEADY)  237 mL Oral BID BM  . feeding supplement (PRO-STAT SUGAR FREE 64)  30 mL Oral BID  . fentaNYL  12.5 mcg Transdermal Q72H  . gabapentin  300 mg Oral QHS  . heparin  2,600 Units Dialysis Once in dialysis  . insulin aspart  0-9 Units Subcutaneous TID WC  . multivitamin with minerals  1 tablet Oral  Daily  . polyethylene glycol  17 g Oral Daily  . silver sulfADIAZINE  1 application Topical BID  . sodium chloride flush  3 mL Intravenous Q12H  . sodium thiosulfate infusion for calciphylaxis  25 g Intravenous Q T,Th,Sa-HD  . sucroferric oxyhydroxide  1,000 mg Oral TID WC    Continuous Infusions:    PRN Meds: sodium chloride, sodium chloride, acetaminophen **OR** acetaminophen, albuterol, camphor-menthol **AND** hydrOXYzine, docusate sodium, docusate sodium, feeding supplement (NEPRO CARB STEADY), fentaNYL (SUBLIMAZE) injection, mometasone-formoterol, nitroGLYCERIN, ondansetron **OR** ondansetron (ZOFRAN) IV, sorbitol, zolpidem  Physical Exam         Wd, elderly pleasant male.  Slightly sleepy, slow but clear speech. CV rrr Resp NAD Abd distended and firm.  Appears larger than 7/6. Ext able to move all 4  Vital Signs: BP 101/55 mmHg  Pulse 74  Temp(Src) 98.2 F (36.8 C) (Oral)  Resp 18  Ht 5\' 6"  (1.676 m)  Wt 72.6 kg (160 lb 0.9 oz)  BMI 25.85 kg/m2  SpO2 95% SpO2: SpO2: 95 % O2 Device: O2 Device: Not Delivered O2 Flow Rate: O2 Flow Rate (L/min): 2 L/min  Intake/output summary:   Intake/Output Summary (Last 24 hours) at 05/02/16 1404 Last data filed at 05/02/16 0538  Gross per 24 hour  Intake    240 ml  Output      0 ml  Net    240 ml   LBM: Last BM Date: 05/01/16 Baseline Weight: Weight: 85.276 kg (188 lb) Most recent weight: Weight: 72.6 kg (160 lb 0.9 oz)       Palliative Assessment/Data:    Flowsheet Rows        Most Recent Value   Intake Tony    Referral Department  Hospitalist   Unit at Time of Referral   Cardiac/Telemetry Unit   Palliative Care Primary Diagnosis  Nephrology   Date Notified  04/28/16   Palliative Care Type  New Palliative care   Reason for referral  Pain   Date of Admission  04/24/16   Date first seen by Palliative Care  04/29/16   # of days Palliative referral response time  1 Day(s)   # of days IP prior to Palliative referral  4   Clinical Assessment    Palliative Performance Scale Score  30%   Psychosocial & Spiritual Assessment    Palliative Care Outcomes    Patient/Family meeting held?  Yes   Who was at the meeting?  patient, son, nephew Vincente [place], dtr in law, grandson   Palliative Care Outcomes  Improved pain interventions, Clarified goals of care, Counseled regarding hospice      Patient Active Problem List   Diagnosis Date Noted  . Left leg pain   . Palliative care encounter   . Leg pain, anterior   . Goals of care, counseling/discussion   . Pressure ulcer 04/27/2016  . Hypoxia   . Calciphylaxis   . Ascites 04/25/2016  . Leukocytosis 04/25/2016  . Acute respiratory failure with hypoxia ([place]) 04/24/2016  . Leg pain 04/24/2016  . Hyperkalemia   . Hypervolemia   . Pleural effusion   . Pleural effusion on right 03/03/2016  . Calcification of soft tissue   . HCAP (healthcare-associated pneumonia) 01/10/2016  . Ulcer of penis 01/10/2016  . ESRD (end stage renal disease) on dialysis ([place])   . Chronic kidney disease, stage V ([place]) 11/19/2015  . Encounter for therapeutic drug monitoring 06/25/2015  . Atrial fibrillation, unspecified 06/19/2015  . Chronic systolic heart failure ([place])  05/25/2015  . HLD (hyperlipidemia) 05/25/2015  . CN (constipation) 05/25/2015  . Protein-calorie malnutrition ([place]) 05/25/2015  . ESRD (end stage renal disease) ([place]) 05/24/2015  . Neuropathy ([place]) 05/24/2015  . Atrial fibrillation ([place]) 05/24/2015  . Anemia of chronic disease 02/14/2015  . Hypercalcemia 01/24/2015  . Acute bronchitis 01/24/2015  . Syncope 07/20/2014  .  Lower extremity edema 07/16/2014  . Hypoglycemia 07/16/2014  . Acute renal failure superimposed on stage 4 chronic kidney disease ([place]) 07/16/2014  . Acute on chronic combined systolic and diastolic heart failure ([place]) 07/16/2014  . Chronic kidney disease (CKD), stage IV (severe) ([place]) 04/04/2014  . DM (diabetes mellitus), type 2, uncontrolled, with renal complications ([place]) 0000000  . Essential hypertension, benign 04/04/2014  . Chronic combined systolic and diastolic CHF (congestive heart failure) ([place]) 04/04/2014  . CAD (coronary artery disease) 04/04/2014  . Other and unspecified hyperlipidemia 04/04/2014  . Abnormal renal ultrasound with left renal lesion and bladder wall thickening 04/04/2014  . Microscopic hematuria 04/04/2014  . Abnormal brain CT/age indeterminate lacunar infarct 04/04/2014  . PAF (paroxysmal atrial fibrillation) ([place]) 04/04/2014  . Hyperglycemia 04/02/2014  . Sepsis ([place]) 04/02/2014  . Encephalopathy 04/02/2014  . Elevated troponin 04/02/2014  . Renal failure 04/02/2014    Palliative Care Assessment & Plan   Patient Profile: 65 y.o. male with past medical history of ESRD, mixed heart failure with an EF of 15 - 20%, cardiac arrest s/p AICD placement, aflutter, dementia, who was admitted on 04/24/2016 with worsening LLE pain and acute respiratory failure secondary to volume overload and possible pneumonia. The leg pain is felt to be due to calciphylaxis ulcerations. Since admission he has received daily HD to remove volume and excess calcium. He also received thiosulfate in HD to reduce the calcium in his blood. Due to ascites he underwent paracentesis and 6L was removed.   Recommendations/Plan:  While son is aware that his father's prognosis is limited he supports his choice to remain full code.  Completed living will and HCPOA today.  Placed order for chaplain to notarize.  Trial fentanyl 12.5 mcg patch with prn fentanyl pushes.   Will re-evaluate in am  prior to d/c  Recommend evaluation at the wound care center after discharge  Please order outpatient Palliative Follow up at SNF in D/C summary.  Code Status:    Code Status Orders        Start     Ordered   04/24/16 0639  Full code   Continuous     04/24/16 0640   Prognosis:   < 12 months given ESRD with hypercalcemia and probable calciphylaxis as well as end stage heart failure.  Discharge Planning:  [place] for rehab with Palliative care service follow-up  Care plan was discussed with patient and son, bedside RN.  Thank you for allowing the Palliative Medicine Team to assist in the care of this patient.   Time In: 11:00 Time Out: 11:35 Total Time 35 min Prolonged Time Billed no      Greater than 50%  of this time was spent counseling and coordinating care related to the above assessment and plan.  Imogene Burn, PA-C Palliative Medicine Pager: [phone]  Please contact Palliative Medicine Team phone at [phone] for questions and concerns.

## 2016-05-02 NOTE — Progress Notes (Signed)
[facility] Progress Note  Assessment/Plan: 1 L leg pain/ swelling/ erythema - cellulitis and/or calciphylaxis; blood cultures neg . On Maxipime WBC unchanged. 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 past week Noted per Dr Jonnie Finner Emp[facility] Rx at this time, consider skin biopsy during the week. the goal is to get as much Ca out of the body as possible using low Ca bath and avoiding all Ca/ vit D products. The goal uncorr Ca for him will be 7.0- 8.0 (current Ca 10 on admit, down to 9.0 7/5). Also use Na thiosulfate as antioxidant w HD tiw. Stopped coumadin, now on apixaban. On Na thio 5 Anemia hgb 10.4 not on outpatent ESA stable- follow hgb 6 MBD- with Calciphylaxis - Am labs = Phos 7.7 down to 5.4. 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 patient has low health literacy/ Dr. Jonnie Finner spoke w pt's son, prognosis is guarded.Appreciate palliative care following and assisting with care. Not a lot of good options.  13. Malnutrition - intake poor alb 2.3- on renal carb mod - add nepro/prostat 14. Thrombocytopenia - plts borderline 15. Disp - planning SNF discharge with palliative care to follow 16. Right arm weakness - 3-4/5 - reported to nursing - no other deficits identified; watch  [name], PA-C [place] [phone] 05/02/2016,8:50 AM  LOS: 8 days   Pt seen, examined and agree w A/P as above. For dc to SNF today most likely.  Kelly Splinter MD [facility] pager [phone]    cell [phone] 05/02/2016, 2:24 PM    Subjective:   Son cut his right 5th finger last night when trimming nails- still oozing.  Notes weakness in  Right arm/hand when eating this am, though was able to feed self entire breakfast  Objective Filed Vitals:   05/01/16 1151 05/01/16 1625 05/01/16 2003 05/02/16 0537  BP: 136/56 98/40 105/42 103/50  Pulse: 73 90 73 80  Temp: 98.1 F (36.7 C) 99.8 F (37.7 C) 98.2 F (36.8 C) 98.5 F (36.9 C)  TempSrc: Oral Oral Oral Oral  Resp: 17 18 17 18   Height:      Weight:    72.6 kg (160 lb 0.9 oz)  SpO2: 100% 91% 92% 94%   Physical Exam General: alert and articulate today, speech and though process clear Heart: RRR Lungs: dim BS no overt rales Abdomen: distended/ascites nontender + BS Extremities: no LE edema; multiple dark area on legs; right 5th figer dressed; blood on tip Neuro:  Left upper strength 5/5; right 3-4/5 - checked it later in exam and it was stronger; LE strenth = 5/5; CN ok Dialysis  Access: right upper AVF and right IJ  Dialysis Orders: AF TTS 78kg 3K/2.25 bath P4 Hep 2600 IJ cath (maturing RUA AVF) Na thio 25 gm (for calciphylaxis)  Additional Objective Labs: Basic Metabolic Panel:  Recent Labs Lab 04/28/16 0347 04/29/16 0511 04/30/16 0619  NA 130* 132* 131*  K 5.7* 5.1 4.8  CL 85* 96* 94*  CO2 20* 22 25  GLUCOSE 159* 132* 170*  BUN 80* 45* 37*  CREATININE 7.40* 5.22* 4.35*  CALCIUM 10.2 8.7* 9.0  PHOS 7.7* 6.0* 5.4*   Liver Function Tests:  Recent Labs Lab 04/25/16 1000  04/27/16 0623 04/28/16 0347 04/29/16 0511 04/30/16 0619  AST 26  --  23  --   --   --   ALT 19  --  17  --   --   --   ALKPHOS 137*  --  151*  --   --   --   BILITOT 1.7*  --  1.3*  --   --   --    PROT 7.6  --  6.6  --   --   --   ALBUMIN 2.9*  < > 2.5*  2.5* 2.5* 2.5* 2.3*  < > = values in this interval not displayed. No results for input(s): LIPASE, AMYLASE in the last 168 hours. CBC:  Recent Labs Lab 04/26/16 0527 04/27/16 0623 04/28/16 0347 04/29/16 0511 04/30/16 0609  WBC 13.5* 12.9* 13.9* 12.3* 13.8*  NEUTROABS 11.3* 10.6*  --   --   --   HGB 10.1* 10.2* 10.0* 9.8* 10.4*  HCT 31.6* 31.6* 30.9* 30.6* 33.1*  MCV 100.0 99.1 97.8 98.4 103.1*  PLT 146* 154 146* 156 132*   Blood Culture CBG:  Recent Labs Lab 05/01/16 1150 05/01/16 1623 05/01/16 2001 05/01/16 2131 05/02/16 0753  GLUCAP 98 196* 170* 189* 132*    No results found. Medications:   . amiodarone  100 mg Oral Daily  . apixaban  5 mg Oral BID  . atorvastatin  40 mg Oral q1800  . carvedilol  3.125 mg Oral BID WC  . ceFEPime (MAXIPIME) IV  2 g Intravenous Q T,Th,Sat-1800  . clobetasol ointment  1 application Topical BID  . clopidogrel  75 mg Oral Daily  . fentaNYL  12.5 mcg Transdermal Q72H  . gabapentin  300 mg Oral QHS  . heparin  2,600 Units Dialysis Once in dialysis  . insulin aspart  0-9 Units Subcutaneous TID WC  . multivitamin with minerals  1 tablet Oral Daily  . polyethylene glycol  17 g Oral Daily  . silver sulfADIAZINE  1 application Topical BID  . sodium chloride flush  3 mL Intravenous Q12H  . sodium thiosulfate infusion for calciphylaxis  25 g Intravenous Q T,Th,Sa-HD  . sucroferric oxyhydroxide  1,000 mg Oral TID WC

## 2016-05-02 NOTE — Clinical Social Work Placement (Addendum)
   CLINICAL SOCIAL WORK PLACEMENT  NOTE 05/02/16 - DISCHARGED TO [facility]  Date:  05/02/2016  Patient Details  Name: Tony Freeman MRN: [id number] Date of Birth: 22-May-1951  Clinical Social Work is seeking post-discharge placement for this patient at the [place] level of care (*CSW will initial, date and re-position this form in  chart as items are completed):  No (Son expressed skilled facility preference)   Patient/family provided with [facility] Work Department's list of facilities offering this level of care within the geographic area requested by the patient (or if unable, by the patient's family).  Yes   Patient/family informed of their freedom to choose among providers that offer the needed level of care, that participate in Medicare, Medicaid or managed care program needed by the patient, have an available bed and are willing to accept the patient.  No   Patient/family informed of [facility]'s ownership interest in [facility] and [facility], as well as of the fact that they are under no obligation to receive care at these facilities.  PASRR submitted to EDS on       PASRR number received on       Existing PASRR number confirmed on 05/02/16     FL2 transmitted to all facilities in geographic area requested by pt/family on       FL2 transmitted to all facilities within larger geographic area on       Patient informed that his/her managed care company has contracts with or will negotiate with certain facilities, including the following:        Yes   Patient/family informed of bed offers received.  Patient chooses bed at [facility]     Physician recommends and patient chooses bed at      Patient to be transferred to [facility] on 05/02/16.  Patient to be transferred to facility by [name])     Patient family notified on 05/02/16 of transfer.  Name of family member notified:  [name] - son at  the bedside     PHYSICIAN      Additional Comment:  05/02/16 - Facility received insurance authorization received from [facility]   _______________________________________________ Sable Feil, LCSW 05/02/2016, 5:51 PM

## 2016-05-02 NOTE — Progress Notes (Signed)
   05/02/16 1600  Clinical Encounter Type  Visited With Patient;Patient and family together  Visit Type Follow-up  Referral From Nurse  Consult/Referral To Chaplain  Spiritual Encounters  Spiritual Needs Literature  Stress Factors  Patient Stress Factors Exhausted  Family Stress Factors Exhausted   Chaplain responded to request for AD paperwork. Patient's family already had the paperwork and just needed a notary.  Notary was not available.  Family will take the completed paperwork to the bank to be notarized.  Tony Freeman 05/02/2016 4:13 PM

## 2016-05-03 NOTE — Progress Notes (Signed)
Patient c/o severe pain to bilateral lower extremities - rating as 8/10.  No PIV.  Left arm fentanyl patch.  [facility] here to transport patient to [facility].  [facility] notified and received order for 5 mg oxycodone PO.  This was given at 2026.  Son had taken patient's personal belongings with him earlier in the day.  The patient had a cell phone which he had with him when taken by [facility].  All paperwork including fentanyl patch prescription was given to [facility].  All questions answered.  Earleen Reaper RN-BC, [employer]

## 2016-05-05 ENCOUNTER — Non-Acute Institutional Stay (SKILLED_NURSING_FACILITY): Payer: Medicare HMO | Admitting: Adult Health

## 2016-05-05 ENCOUNTER — Encounter: Payer: Self-pay | Admitting: Adult Health

## 2016-05-05 DIAGNOSIS — Z992 Dependence on renal dialysis: Secondary | ICD-10-CM

## 2016-05-05 DIAGNOSIS — I480 Paroxysmal atrial fibrillation: Secondary | ICD-10-CM

## 2016-05-05 DIAGNOSIS — E43 Unspecified severe protein-calorie malnutrition: Secondary | ICD-10-CM | POA: Diagnosis not present

## 2016-05-05 DIAGNOSIS — I5043 Acute on chronic combined systolic (congestive) and diastolic (congestive) heart failure: Secondary | ICD-10-CM | POA: Diagnosis not present

## 2016-05-05 DIAGNOSIS — J9601 Acute respiratory failure with hypoxia: Secondary | ICD-10-CM

## 2016-05-05 DIAGNOSIS — G629 Polyneuropathy, unspecified: Secondary | ICD-10-CM | POA: Diagnosis not present

## 2016-05-05 DIAGNOSIS — I2510 Atherosclerotic heart disease of native coronary artery without angina pectoris: Secondary | ICD-10-CM

## 2016-05-05 DIAGNOSIS — J90 Pleural effusion, not elsewhere classified: Secondary | ICD-10-CM

## 2016-05-05 DIAGNOSIS — N186 End stage renal disease: Secondary | ICD-10-CM

## 2016-05-05 DIAGNOSIS — E1122 Type 2 diabetes mellitus with diabetic chronic kidney disease: Secondary | ICD-10-CM | POA: Diagnosis not present

## 2016-05-05 DIAGNOSIS — R188 Other ascites: Secondary | ICD-10-CM

## 2016-05-05 DIAGNOSIS — E785 Hyperlipidemia, unspecified: Secondary | ICD-10-CM

## 2016-05-05 DIAGNOSIS — E1165 Type 2 diabetes mellitus with hyperglycemia: Secondary | ICD-10-CM

## 2016-05-05 NOTE — Progress Notes (Signed)
Patient ID: Tony Freeman, male   DOB: August 14, 1951, 65 y.o.   MRN: [id number]    DATE:  05/05/2016   MRN:  [id number]  BIRTHDAY: [birth year]  Facility:  Nursing Home Location:  [place] Room Number: [id number]  LEVEL OF CARE:  [id number])  Contact Information    Name Relation Home Work Mobile   Bruin Son [phone]  [phone]   Dalton,[name] Daughter  [phone]   [place] Daughter [phone]  [phone]       Code Status History    Date Active Date Inactive Code Status Order ID Comments User Context   04/24/2016  6:40 AM 05/02/2016 11:52 PM Full Code [id number]  Rise Patience, MD Inpatient   01/10/2016 11:16 PM 01/19/2016  9:40 PM Full Code [id number]  Ivor Costa, MD ED   01/31/2015 11:57 AM 02/01/2015  3:45 AM Full Code [id number]  Markus Daft, MD HOV   01/24/2015 12:57 PM 01/26/2015  5:23 PM Full Code [id number]  Verlee Monte, MD ED   07/25/2014  9:11 AM 07/31/2014  6:19 PM Full Code [id number]  Larey Dresser, MD Inpatient   07/16/2014  9:38 PM 07/25/2014  9:11 AM Full Code [id number]  Cresenciano Genre, MD Inpatient   04/02/2014  3:04 AM 04/08/2014  5:58 PM Full Code [id number]  Elmarie Shiley, MD Inpatient       Chief Complaint  Patient presents with  . Hospitalization Follow-up    HISTORY OF PRESENT ILLNESS:  This is a 65 year old male who has been admitted to [facility] on 05/02/16 from [facility]. He has PMH of chronic atrial fibrillation (was previously on anticoagulant with Coumadin), ischemic cardiomyopathy S/P ICD placement, CAD S/P stenting, diabetes mellitus type 2, ESRD on hemodialysis on Tuesdays, Thursday and Saturday and chronic combined systolic and diastolic heart failure. He was treated in the ER for hypoxia with BiPAP. Chest x-ray showed airspace opacity and worsening right-sided pleural effusion. He was treated for hyperkalemia with calcium gluconate, sodium bicarbonate and insulin D50. He  was treated for presumed pneumonia and was started on empiric antibiotics. He was transitioned quickly off BiPAP after first hemodialysis session with improvement with volume overload. 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 Nephrology recommended evaluation and follow-up in the wound care center for HBO treatments.  He has been admitted for a short-term rehabilitation.  PAST MEDICAL HISTORY:  Past Medical History  Diagnosis Date  . Systolic and diastolic CHF, chronic ([place])     a. Echo at [facility] 11/17/2013 Ef <25%, severe hypokinesis of LV, moderately dilated LA, grade IV/IV diastolic dysfunction with irreversible restrictive pattern of mitral inflow, severe pulm HTN (RVSP >41mmHg), 1-2+ MR  b. Echo at [facility] 04/02/14 EF 25-30%, moderate concentric LVH, diffuse hypokinesis, moderately dilated LA, no significant MR observed  . CAD (coronary artery disease)     a. PCI LAD/RCA in 2007  b. PCI LAD/RCA in 2009 at [facility]  c. PCI LAD 08/2013, CTO of RCA and nonobstructive disease in the LCx.  Marland Kitchen Hyperlipidemia   . Dementia   . Ischemic cardiomyopathy     a. s/p Biotronik ICD 06/2014.  Marland Kitchen Retinopathy     bilateral  . HTN (hypertension)   . Abnormal renal ultrasound     a. 03/2014: Abnormal renal ultrasound with left renal lesion and bladder wall thickening and microscopic hematuria. Multiple cysts on prior MRI. Given inability to use contrast, f/u imaging limited, repeat  US 3 months with outpatient uro f/u.  Marland Kitchen Paroxysmal atrial fibrillation (HCC)   . Paroxysmal atrial flutter ([place])   . Anemia   . Myocardial infarction [facility]) July 2016    Heart attack  . Peripheral vascular disease ([place])   . Stroke [facility])     right arm weakness  . Asthma   . CKD (chronic kidney disease), stage IV ([place])     on dialysis T/TH/Sa  . Diabetes mellitus with nephropathy ([place])     type 2  . H/O cardiac arrest 04/2015  . Constipation   . AICD (automatic  cardioverter/defibrillator) present   . ESRD (end stage renal disease) on dialysis ([place])   . Pneumonia     when he was younger, March 2017     CURRENT MEDICATIONS: Reviewed  Patient's Medications  New Prescriptions   No medications on file  Previous Medications   ACETAMINOPHEN (TYLENOL) 325 MG TABLET    Take 650 mg by mouth every 8 (eight) hours as needed for headache (pain).    ALBUTEROL (PROVENTIL HFA;VENTOLIN HFA) 108 (90 BASE) MCG/ACT INHALER    Inhale 2 puffs into the lungs every 6 (six) hours as needed for wheezing or shortness of breath.   ALBUTEROL (PROVENTIL) (2.5 MG/3ML) 0.083% NEBULIZER SOLUTION    Take 2.5 mg by nebulization every 6 (six) hours as needed for wheezing or shortness of breath.   AMIODARONE (PACERONE) 200 MG TABLET    Take 0.5 tablets (100 mg total) by mouth daily.   APIXABAN (ELIQUIS) 5 MG TABS TABLET    Take 1 tablet (5 mg total) by mouth 2 (two) times daily.   ATORVASTATIN (LIPITOR) 40 MG TABLET    TAKE ONE TABLET BY MOUTH ONCE DAILY   CARVEDILOL (COREG) 3.125 MG TABLET    Take 1 tablet (3.125 mg total) by mouth 2 (two) times daily with a meal.   CLOBETASOL OINTMENT (TEMOVATE) 0.05 %    Apply 1 application topically 2 (two) times daily.   CLOPIDOGREL (PLAVIX) 75 MG TABLET    TAKE ONE TABLET BY MOUTH ONCE DAILY   DOCUSATE SODIUM (COLACE) 100 MG CAPSULE    Take 100 mg by mouth daily as needed (constipation).    FENTANYL (DURAGESIC - DOSED MCG/HR) 12 MCG/HR    Place 1 patch (12.5 mcg total) onto the skin every 3 (three) days.   GABAPENTIN (NEURONTIN) 300 MG CAPSULE    Take 300 mg by mouth at bedtime.   INSULIN ASPART (NOVOLOG) 100 UNIT/ML INJECTION    Inject 3 Units into the skin 3 (three) times daily with meals.   INSULIN ASPART (NOVOLOG) 100 UNIT/ML INJECTION    Inject 0-9 Units into the skin 3 (three) times daily with meals. Sliding scale:  0-69 hypoglycemic protocol and notify MD/NP 70-120 = 0 units, 121-150 = 1 unit, 151-200 = 3 units, 201-250 = 3 units; 251-300  = 5 units, 301-350 = 7 units; 351-400 = 9 units; >400 notify MD/NP   MOMETASONE-FORMOTEROL (DULERA) 200-5 MCG/ACT AERO    Inhale 2 puffs into the lungs 2 (two) times daily.   MULTIPLE VITAMIN (MULTIVITAMIN WITH MINERALS) TABS TABLET    Take 1 tablet by mouth daily.   MUPIROCIN OINTMENT (BACTROBAN) 2 %    Place 1 application into the nose 2 (two) times daily.   NITROGLYCERIN (NITROSTAT) 0.4 MG SL TABLET    Place 1 tablet (0.4 mg total) under the tongue every 5 (five) minutes as needed for chest pain.   SILVER SULFADIAZINE (SILVADENE) 1 %  CREAM    Apply 1 application topically 2 (two) times daily.   UNABLE TO FIND    Med Name: Med Pass 120 mL BID between meals for supplement  Modified Medications   No medications on file  Discontinued Medications   BUDESONIDE-FORMOTEROL (SYMBICORT) 160-4.5 MCG/ACT INHALER    Inhale 2 puffs into the lungs 2 (two) times daily.   INSULIN ASPART (NOVOLOG) 100 UNIT/ML INJECTION    Inject 0-9 Units into the skin 3 (three) times daily with meals.   MOMETASONE-FORMOTEROL (DULERA) 200-5 MCG/ACT AERO    Inhale 2 puffs into the lungs 2 (two) times daily as needed for wheezing or shortness of breath.   NUTRITIONAL SUPPLEMENTS (FEEDING SUPPLEMENT, NEPRO CARB STEADY,) LIQD    Take 237 mLs by mouth 3 (three) times daily as needed (Supplement).   NUTRITIONAL SUPPLEMENTS (FEEDING SUPPLEMENT, NEPRO CARB STEADY,) LIQD    Take 237 mLs by mouth 2 (two) times daily between meals.     No Known Allergies   REVIEW OF SYSTEMS:  GENERAL: no change in appetite, no fatigue, no weight changes, no fever, chills or weakness EYES: Denies change in vision, dry eyes, eye pain, itching or discharge EARS: Denies change in hearing, ringing in ears, or earache NOSE: Denies nasal congestion or epistaxis MOUTH and THROAT: Denies oral discomfort, gingival pain or bleeding, pain from teeth or hoarseness   RESPIRATORY: no cough, SOB, DOE, wheezing, hemoptysis CARDIAC: no chest pain, edema or  palpitations GI: no abdominal pain, diarrhea, constipation, heart burn, nausea or vomiting GU: Denies dysuria, frequency, hematuria, incontinence, or discharge PSYCHIATRIC: Denies feeling of depression or anxiety. No report of hallucinations, insomnia, paranoia, or agitation   PHYSICAL EXAMINATION  GENERAL APPEARANCE: Well nourished. In no acute distress. Normal body habitus SKIN:  BLE  And penis wounds HEAD: Normal in size and contour. No evidence of trauma EYES: Lids open and close normally. No blepharitis, entropion or ectropion. PERRL. Conjunctivae are clear and sclerae are white. Lenses are without opacity EARS: Pinnae are normal. Patient hears normal voice tunes of the examiner MOUTH and THROAT: Lips are without lesions. Oral mucosa is moist and without lesions. Tongue is normal in shape, size, and color and without lesions NECK: supple, trachea midline, no neck masses, no thyroid tenderness, no thyromegaly LYMPHATICS: no LAN in the neck, no supraclavicular LAN RESPIRATORY: breathing is even & unlabored, BS CTAB CARDIAC: RRR, no murmur,no extra heart sounds, no edema, left chest defibrillator, right chest vascath GI: abdomen soft, normal BS, no masses, no tenderness, no hepatomegaly, no splenomegaly EXTREMITIES:  Able to move 4 extremities PSYCHIATRIC: Alert and oriented X 3. Affect and behavior are appropriate  LABS/RADIOLOGY: Labs reviewed: Basic Metabolic Panel:  Recent Labs  04/25/16 0216  04/28/16 0347 04/29/16 0511 04/30/16 0619  NA 134*  < > 130* 132* 131*  K 4.9  < > 5.7* 5.1 4.8  CL 94*  < > 85* 96* 94*  CO2 24  < > 20* 22 25  GLUCOSE 130*  < > 159* 132* 170*  BUN 41*  < > 80* 45* 37*  CREATININE 4.68*  < > 7.40* 5.22* 4.35*  CALCIUM 9.9  < > 10.2 8.7* 9.0  MG 2.1  --   --   --   --   PHOS 5.3*  < > 7.7* 6.0* 5.4*  < > = values in this interval not displayed. Liver Function Tests:  Recent Labs  04/24/16 0710  04/25/16 1000  04/27/16 0623 04/28/16 0347  04/29/16 0511 04/30/16  [id number]  AST 25  --  26  --  23  --   --   --   ALT 20  --  19  --  17  --   --   --   ALKPHOS 145*  --  137*  --  151*  --   --   --   BILITOT 1.8*  --  1.7*  --  1.3*  --   --   --   PROT 7.2  --  7.6  --  6.6  --   --   --   ALBUMIN 3.0*  < > 2.9*  < > 2.5*  2.5* 2.5* 2.5* 2.3*  < > = values in this interval not displayed.  CBC:  Recent Labs  04/25/16 0216 04/26/16 0527 04/27/16 0623 04/28/16 0347 04/29/16 0511 04/30/16 0609  WBC 17.2* 13.5* 12.9* 13.9* 12.3* 13.8*  NEUTROABS 14.5* 11.3* 10.6*  --   --   --   HGB 9.8* 10.1* 10.2* 10.0* 9.8* 10.4*  HCT 30.8* 31.6* 31.6* 30.9* 30.6* 33.1*  MCV 101.3* 100.0 99.1 97.8 98.4 103.1*  PLT 164 146* 154 146* 156 132*   Lipid Panel:  Recent Labs  11/16/15 0930  HDL 34*   Cardiac Enzymes:  Recent Labs  04/24/16 0710 04/24/16 1353 04/24/16 1822  TROPONINI 0.34* 0.39* 0.40*   CBG:  Recent Labs  05/02/16 1146 05/02/16 1703 05/02/16 2017  GLUCAP 193* 180* 155*      Dg Chest 2 View  04/27/2016  CLINICAL DATA:  Pleural effusion; Pt reports cough "for a while"; he denies CP or SOB; h/o ICD insertion, HTN, and DM; non-smoker EXAM: CHEST - 2 VIEW COMPARISON:  04/25/2016 FINDINGS: Moderate right and smaller left pleural effusions. Adjacent consolidation/ atelectasis in the lower lobes. Some improvement in right mid lung aeration. Improvement in interstitial edema. Stable tunneled right IJ hemodialysis catheter, and left subclavian AICD. Cardiomegaly with extensive coronary calcifications. Atheromatous aorta. Visualized bones unremarkable. IMPRESSION: 1. Interval improvement in interstitial edema and right midlung infiltrate 2. Persistent pleural effusions and bibasilar atelectasis/consolidation. 3.  Aortic Atherosclerosis (ICD10-170.0) Electronically Signed   By: Lucrezia Europe M.D.   On: 04/27/2016 10:41   Dg Chest 2 View  04/24/2016  CLINICAL DATA:  Dyspnea.  Lower extremity pain and swelling. EXAM: CHEST  2  VIEW COMPARISON:  04/17/2016 FINDINGS: Dual-lumen right jugular central line extending to the cavoatrial junction. Intact transvenous cardiac leads. Unchanged cardiomegaly. Slightly enlarged right pleural effusion. Unchanged small left effusion. Mild airspace opacity in the central right lung and in the right lung base, worsened. This may represent asymmetric alveolar edema. Infectious infiltrate not excluded. IMPRESSION: Mildly worsened central and basilar right lung opacities. This could represent asymmetric alveolar edema or infectious infiltrate. Right pleural effusion is slightly enlarged from 04/17/2016. Electronically Signed   By: Andreas Newport M.D.   On: 04/24/2016 01:53   Dg Chest 2 View  04/17/2016  CLINICAL DATA:  Asthma.  History of MI, CHF. EXAM: CHEST  2 VIEW COMPARISON:  03/14/2016. FINDINGS: Dual-lumen right IJ catheter in stable position. Cardiac pacer stable position. Stable cardiomegaly. Coronary artery calcification. Low lung volumes with bibasilar atelectasis and/or mild infiltrates. Small bilateral pleural effusions. IMPRESSION: 1. Dual-lumen right IJ catheter in stable position . 2. Cardiac pacer in stable position. Coronary artery disease. Cardiomegaly with bibasilar atelectasis and/or infiltrates and bilateral pleural effusions unchanged. Electronically Signed   By: Brinkley   On: 04/17/2016 09:54   Ct Chest Wo Contrast  04/24/2016  CLINICAL DATA:  Further evaluate pleural effusion. Cough. History of MI in 2016. History of hypertension. EXAM: CT CHEST WITHOUT CONTRAST TECHNIQUE: Multidetector CT imaging of the chest was performed following the standard protocol without IV contrast. COMPARISON:  04/24/2016 FINDINGS: Cardiovascular: Extensive coronary artery calcifications are present. Heart is mildly enlarged. No pericardial effusion. Left-sided AICD leads extend into the right atrium and right ventricle. A a right-sided dialysis catheter tip extends to the level of the SVC  right atrial junction. Mediastinum/Nodes: The visualized portion of the thyroid gland has a normal appearance. Small mediastinal lymph nodes are present. Largest is identified in the region the precarinal station, measuring approximate 1.0 cm. Lungs/Pleura: There are bilateral pleural effusions, right greater than left. Bibasilar atelectasis is present. Parenchymal calcifications are identified within the lung bases, right greater than left likely representing calcified granulomata. No suspicious pulmonary nodules are identified. There is smooth septal thickening in the lungs, consistent with mild pulmonary edema. Upper Abdomen: Nodular contour of the liver raising the question of cirrhosis. Calcified granulomata are identified within the liver. There is dense atherosclerotic calcification of the abdominal aorta and its branches. Ascites. Calcified gallstone or gallstones. Musculoskeletal: Mild mid thoracic spondylosis. No suspicious lytic or blastic lesions are identified. IMPRESSION: 1. Small to moderate right pleural effusion. 2. Small left pleural effusion. 3. Bibasilar atelectasis. 4. Coronary artery disease and mild cardiomegaly. 5. Catheters and pacemaker as described. 6. Abdominal ascites and probable cirrhosis. 7. Cholelithiasis. Electronically Signed   By: Nolon Nations M.D.   On: 04/24/2016 15:17   US Abdomen Limited  04/24/2016  CLINICAL DATA:  Abdominal distention for 1 year. Evaluate for ascites. EXAM: LIMITED ABDOMEN ULTRASOUND FOR ASCITES TECHNIQUE: Limited ultrasound survey for ascites was performed in all four abdominal quadrants. COMPARISON:  11/21/2015 CT FINDINGS: Ultrasound is performed of the 4 quadrants, demonstrating moderate ascites throughout the abdomen and pelvis. IMPRESSION: Moderate ascites. Electronically Signed   By: Nolon Nations M.D.   On: 04/24/2016 15:29   US Paracentesis  04/26/2016  INDICATION: 65 year old with a history of congestive heart failure as well as  end-stage renal disease on hemodialysis. He has developed abdominal ascites and a request is made for diagnostic and therapeutic paracentesis. EXAM: ULTRASOUND GUIDED DIAGNOSTIC AND THERAPEUTIC PARACENTESIS MEDICATIONS: 1% lidocaine COMPLICATIONS: None immediate. PROCEDURE: Informed written consent was obtained from the patient after a discussion of the risks, benefits and alternatives to treatment. A timeout was performed prior to the initiation of the procedure. Initial ultrasound scanning demonstrates a large amount of ascites within the right upper abdominal quadrant. The right upper abdomen was prepped and draped in the usual sterile fashion. 1% lidocaine was used for local anesthesia. Following this, a 19 gauge, 7-cm, Yueh catheter was introduced. An ultrasound image was saved for documentation purposes. The paracentesis was performed. The catheter was removed and a dressing was applied. The patient tolerated the procedure well without immediate post procedural complication. FINDINGS: A total of approximately 6 L of tan chylous colored fluid was removed. Samples were sent to the laboratory as requested by the clinical team. IMPRESSION: Successful ultrasound-guided paracentesis yielding 6 liters of peritoneal fluid. We stopped at 6 L as this is a first time paracentesis maximum. Read by: Saverio Danker, PA-C Electronically Signed   By: Lucrezia Europe M.D.   On: 04/25/2016 16:46   Dg Chest Port 1 View  04/25/2016  CLINICAL DATA:  Cough, shortness of Breath EXAM: PORTABLE CHEST 1 VIEW COMPARISON:  04/24/2016 FINDINGS: There is moderate partially loculated  right pleural effusion. Atelectasis or infiltrate right lower lobe. No pulmonary edema. Dual lead cardiac pacemaker with leads in right atrium and right ventricle. Right IJ dialysis catheter with tip in distal SVC. Small left pleural effusion with left basilar atelectasis. IMPRESSION: Moderate partial loculated right pleural effusion, atelectasis or infiltrate in  right lower lobe. No pulmonary edema. Small left pleural effusion with left basilar atelectasis. Electronically Signed   By: Lahoma Crocker M.D.   On: 04/25/2016 14:41   Dg Chest Port 1 View  04/24/2016  CLINICAL DATA:  Dyspnea and weakness EXAM: PORTABLE CHEST 1 VIEW COMPARISON:  04/24/2016 FINDINGS: Intact transvenous cardiac leads. Dual-lumen right jugular central line extends to the cavoatrial junction. Moderate cardiomegaly persists, unchanged. Moderate right pleural effusion persists, unchanged or mildly larger. Central and basilar right lung opacities have mildly worsened. IMPRESSION: Mild worsening of right central airspace opacity and probable continued enlargement of right pleural effusion. This likely represents congestive heart failure with asymmetric alveolar edema. Electronically Signed   By: Andreas Newport M.D.   On: 04/24/2016 03:28   Dg Tibia/fibula Left Port  04/24/2016  CLINICAL DATA:  Left lower extremity pain and swelling for 2 days. No trauma. EXAM: PORTABLE LEFT TIBIA AND FIBULA - 2 VIEW COMPARISON:  None. FINDINGS: Negative for fracture dislocation. There is no bone lesion or bony destruction. There are extensive vascular calcifications. No soft tissue gas or foreign body IMPRESSION: Negative Electronically Signed   By: Andreas Newport M.D.   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 - avoiding all calcium with vitamin D products; goal calcium for patient 7.0 - 8.0  ;  continue Na thiosulfate @ HD; check BMP; Wound Center, urology and physiatry consult; continue Tylenol 325 mg 2 tabs = 650 mg by mouth every 8 hours when necessary, due to Jessica 12.5 g/HR patch 1 patch every 3 days Lab Results  Component Value Date   CALCIUM 9.0 04/30/2016   PHOS 5.4* 04/30/2016    Acute on chronic systolic CHF - 2-D echo from 11/29/15 with EF 15-20%; continue Coreg 3.125 mg 1 tab by mouth twice a day  Paroxysmal atrial fibrillation - chads vasc score 3; continue Eliquis 5 mg 1 tab by mouth twice a day, Coreg 3.125 mg by mouth twice a day and amiodarone 200 mg 1/2 tab = 100 mg by mouth daily  Diabetes mellitus, type II with diabetic nephropathy - continue NovoLog 3 units subcutaneous 3 times a day and Novolog sliding scale 3 times a day  Lab Results  Component Value Date   HGBA1C 5.9* 04/27/2016    Hyperlipidemia - continue atorvastatin 40 mg 1 tab by mouth daily at bedtime Lab Results  Component Value Date   CHOL 110 11/16/2015   HDL 34* 11/16/2015   LDLCALC 64 11/16/2015   TRIG 58 11/16/2015   CHOLHDL 3.2 11/16/2015    CAD - likely secondary to CHF; continue Eliquis, Plavix,  Atorvastatin and NTG when necessary  Right pleural effusion - per CCM it is likely secondary to patient's end-stage renal disease and chronic systolic heart failure (  EF 15-20%) and will continue HD for volume overload  Ascites - S/P paracentesis with 6 L of fluid removed on 04/26/16  Protein-calorie malnutrition, severe - albumin 2.3; RD consult  Neuropathy - gabapentin 300 mg 1 capsule by mouth daily at bedtime    Goals of care:  Short-term rehabilitation      Durenda Age, NP [facility] [phone]

## 2016-05-06 ENCOUNTER — Encounter: Payer: Self-pay | Admitting: Internal Medicine

## 2016-05-06 ENCOUNTER — Non-Acute Institutional Stay (SKILLED_NURSING_FACILITY): Payer: Medicare HMO | Admitting: Internal Medicine

## 2016-05-06 DIAGNOSIS — Z794 Long term (current) use of insulin: Secondary | ICD-10-CM

## 2016-05-06 DIAGNOSIS — R188 Other ascites: Secondary | ICD-10-CM | POA: Diagnosis not present

## 2016-05-06 DIAGNOSIS — Z992 Dependence on renal dialysis: Secondary | ICD-10-CM

## 2016-05-06 DIAGNOSIS — I2510 Atherosclerotic heart disease of native coronary artery without angina pectoris: Secondary | ICD-10-CM | POA: Diagnosis not present

## 2016-05-06 DIAGNOSIS — E785 Hyperlipidemia, unspecified: Secondary | ICD-10-CM

## 2016-05-06 DIAGNOSIS — R5381 Other malaise: Secondary | ICD-10-CM

## 2016-05-06 DIAGNOSIS — M792 Neuralgia and neuritis, unspecified: Secondary | ICD-10-CM

## 2016-05-06 DIAGNOSIS — N186 End stage renal disease: Secondary | ICD-10-CM

## 2016-05-06 DIAGNOSIS — E46 Unspecified protein-calorie malnutrition: Secondary | ICD-10-CM

## 2016-05-06 DIAGNOSIS — D72829 Elevated white blood cell count, unspecified: Secondary | ICD-10-CM

## 2016-05-06 DIAGNOSIS — I5042 Chronic combined systolic (congestive) and diastolic (congestive) heart failure: Secondary | ICD-10-CM | POA: Diagnosis not present

## 2016-05-06 DIAGNOSIS — E871 Hypo-osmolality and hyponatremia: Secondary | ICD-10-CM

## 2016-05-06 DIAGNOSIS — D638 Anemia in other chronic diseases classified elsewhere: Secondary | ICD-10-CM

## 2016-05-06 DIAGNOSIS — J189 Pneumonia, unspecified organism: Secondary | ICD-10-CM

## 2016-05-06 DIAGNOSIS — E1122 Type 2 diabetes mellitus with diabetic chronic kidney disease: Secondary | ICD-10-CM

## 2016-05-06 DIAGNOSIS — I480 Paroxysmal atrial fibrillation: Secondary | ICD-10-CM

## 2016-05-06 NOTE — Progress Notes (Signed)
LOCATION: [place]  PCP: Tanya Nones, MD   Code Status: Full Code  Goals of care: Advanced Directive information Advanced Directives 04/24/2016  Does patient have an advance directive? Yes  Type of Paramedic of [place];Living will  Does patient want to make changes to advanced directive? No - Patient declined  Copy of advanced directive(s) in chart? No - copy requested       Extended Emergency Contact Information Primary Emergency Contact: Furnish,[name] Address: [address] [place], [place] [address] Johnnette Litter of [place] Phone: [phone] Mobile Phone: [phone] Relation: Son Secondary Emergency Contact: [name] [address] Johnnette Litter of [place] Work Phone: [phone] Mobile Phone: [phone] Relation: Daughter   No Known Allergies  Chief Complaint  Patient presents with  . New Admit To SNF    New Admission     HPI:  Patient is a 65 y.o. male seen today for short term rehabilitation post hospital admission from 04/24/16-05/02/16 with acute respiratory failure with hypoxia from volume overload from chf exacerbation and HCAP. He required BiPAP and antibiotics along with diuresis. 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 He underwent paracentesis with 6 liters fluid removal on 04/26/16. He was seen by cardiology, nephrology, wound care among others this admission. He is seen in his room today.   Review of Systems:  Constitutional: Negative for fever, chills, diaphoresis. He feels weak and tired.   HENT: Negative for headache, congestion, nasal discharge, difficulty swallowing.   Eyes: Negative for blurred vision, double vision and discharge.  Respiratory: Negative for cough, shortness of breath and wheezing.   Cardiovascular: Negative for chest pain, palpitations, leg swelling.  Gastrointestinal: Negative for heartburn,  nausea, vomiting, abdominal pain. Last bowel movement was yesterday. Genitourinary: Negative for dysuria and flank pain.  Musculoskeletal: positive for leg pain. Negative for back pain, fall in the facility.  Skin: Negative for itching, rash.  Neurological: Negative for dizziness. Psychiatric/Behavioral: Negative for depression   Past Medical History  Diagnosis Date  . Systolic and diastolic CHF, chronic ([place])     a. Echo at [facility] 11/17/2013 Ef <25%, severe hypokinesis of LV, moderately dilated LA, grade IV/IV diastolic dysfunction with irreversible restrictive pattern of mitral inflow, severe pulm HTN (RVSP >83mmHg), 1-2+ MR  b. Echo at [facility] 04/02/14 EF 25-30%, moderate concentric LVH, diffuse hypokinesis, moderately dilated LA, no significant MR observed  . CAD (coronary artery disease)     a. PCI LAD/RCA in 2007  b. PCI LAD/RCA in 2009 at [facility]  c. PCI LAD 08/2013, CTO of RCA and nonobstructive disease in the LCx.  Marland Kitchen Hyperlipidemia   . Dementia   . Ischemic cardiomyopathy     a. s/p Biotronik ICD 06/2014.  Marland Kitchen Retinopathy     bilateral  . HTN (hypertension)   . Abnormal renal ultrasound     a. 03/2014: Abnormal renal ultrasound with left renal lesion and bladder wall thickening and microscopic hematuria. Multiple cysts on prior MRI. Given inability to use contrast, f/u imaging limited, repeat US 3 months with outpatient uro f/u.  Marland Kitchen Paroxysmal atrial fibrillation (HCC)   . Paroxysmal atrial flutter ([place])   . Anemia   . Myocardial infarction [facility]) July 2016    Heart attack  . Peripheral vascular disease ([place])   . Stroke [facility])     right arm weakness  . Asthma   .  CKD (chronic kidney disease), stage IV ([place])     on dialysis T/TH/Sa  . 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 H/O cardiac arrest 04/2015  . Constipation   . AICD (automatic cardioverter/defibrillator) present   . ESRD (end stage renal disease) on dialysis ([place])   . Pneumonia     when he was  younger, March 2017   Past Surgical History  Procedure Laterality Date  . Coronary stent placement      Multivessel coronary intervention in 2007 and 2009. Ischemic cardiomyopathyPCI LAD/ RCA in 2007 by Dr. Dwyane Dee. PCI LAD/RCA in 2009 at [facility]; PCI LAD 08/2013. Presented with unstable angina pectoris.. Coronary angio demonstrated proximal and distal LAD lesions.64/20 PROMUS stent in proximal LAD// PTCA of distal LAD  . Cardiac defibrillator placement    . Spine surgery      anterior fusion  . Hernia repair    . Tee without cardioversion N/A 07/26/2014    Procedure: TRANSESOPHAGEAL ECHOCARDIOGRAM (TEE);  Surgeon: Larey Dresser, MD;  Location: [place];  Service: Cardiovascular;  Laterality: N/A;  . Cardioversion N/A 07/26/2014    Procedure: CARDIOVERSION;  Surgeon: Larey Dresser, MD;  Location: [place];  Service: Cardiovascular;  Laterality: N/A;  . Right heart catheterization N/A 07/25/2014    Procedure: RIGHT HEART CATH;  Surgeon: Jolaine Artist, MD;  Location: [facility] CATH LAB;  Service: Cardiovascular;  Laterality: N/A;  . Cardiac defibrillator placement  06/2014  . Insertion of dialysis catheter    . Eye surgery Bilateral     for burst blood vessel  . Cardiac catheterization  04/2015  . Coronary angioplasty    . Colonoscopy w/ polypectomy    . Finger fracture surgery Right     little finger  . Av fistula placement Left 08/03/2015    Procedure: ARTERIOVENOUS (AV) FISTULA CREATION VERSUS BASILIC VEIN TRANSPOSITION VERSUS ARTERIOVENOUS GRAFT INSERTION;  Surgeon: Angelia Mould, MD;  Location: [place];  Service: Vascular;  Laterality: Left;  . Peripheral vascular catheterization N/A 11/19/2015    Procedure: Fistulagram;  Surgeon: Angelia Mould, MD;  Location: [place] CV LAB;  Service: Cardiovascular;  Laterality: N/A;  . Tee without cardioversion N/A 01/02/2016    Procedure: TRANSESOPHAGEAL ECHOCARDIOGRAM (TEE);  Surgeon: Larey Dresser, MD;  Location: [place];  Service: Cardiovascular;  Laterality: N/A;  . Cardioversion N/A 01/02/2016    Procedure: CARDIOVERSION;  Surgeon: Larey Dresser, MD;  Location: [place];  Service: Cardiovascular;  Laterality: N/A;  . Av fistula placement Right 02/12/2016    Procedure: Creation of Right Arm  Brachiocephalic ARTERIOVENOUS (AV) FISTULA ;  Surgeon: Angelia Mould, MD;  Location: [place];  Service: Vascular;  Laterality: Right;   Social History:   reports that he has never smoked. He has never used smokeless tobacco. He reports that he does not drink alcohol or use illicit drugs.  Family History  Problem Relation Age of Onset  . Heart disease      brother  . Kidney disease    . Diabetes Mother     Bilateral Leg  . Heart disease Mother   . Cancer Father     Right neck  . Diabetes Sister   . Hyperlipidemia Sister   . Hypertension Sister   . Heart disease Sister   . Diabetes Brother   . Hyperlipidemia Brother   . Hypertension Brother   . Heart attack Brother   . Heart disease Brother  Medications:   Medication List       This list is accurate as of: 05/06/16 12:23 PM.  Always use your most recent med list.               acetaminophen 325 MG tablet  Commonly known as:  TYLENOL  Take 650 mg by mouth every 8 (eight) hours as needed for headache (pain).     albuterol 108 (90 Base) MCG/ACT inhaler  Commonly known as:  PROVENTIL HFA;VENTOLIN HFA  Inhale 2 puffs into the lungs every 6 (six) hours as needed for wheezing or shortness of breath.     albuterol (2.5 MG/3ML) 0.083% nebulizer solution  Commonly known as:  PROVENTIL  Take 2.5 mg by nebulization every 6 (six) hours as needed for wheezing or shortness of breath.     amiodarone 200 MG tablet  Commonly known as:  PACERONE  Take 0.5 tablets (100 mg total) by mouth daily.     apixaban 5 MG Tabs tablet  Commonly known as:  ELIQUIS  Take 1 tablet (5 mg total) by mouth 2 (two) times daily.     atorvastatin 40 MG  tablet  Commonly known as:  LIPITOR  TAKE ONE TABLET BY MOUTH ONCE DAILY     budesonide-formoterol 160-4.5 MCG/ACT inhaler  Commonly known as:  SYMBICORT  Inhale 2 puffs into the lungs 2 (two) times daily.     carvedilol 3.125 MG tablet  Commonly known as:  COREG  Take 1 tablet (3.125 mg total) by mouth 2 (two) times daily with a meal.     clobetasol ointment 0.05 %  Commonly known as:  TEMOVATE  Apply 1 application topically 2 (two) times daily.     clopidogrel 75 MG tablet  Commonly known as:  PLAVIX  TAKE ONE TABLET BY MOUTH ONCE DAILY     docusate sodium 100 MG capsule  Commonly known as:  COLACE  Take 100 mg by mouth daily as needed (constipation).     fentaNYL 12 MCG/HR  Commonly known as:  DURAGESIC - dosed mcg/hr  Place 1 patch (12.5 mcg total) onto the skin every 3 (three) days.     gabapentin 300 MG capsule  Commonly known as:  NEURONTIN  Take 300 mg by mouth at bedtime.     NOVOLOG 100 UNIT/ML injection  Generic drug:  insulin aspart  Inject 0-9 Units into the skin 3 (three) times daily with meals. Sliding scale:  0-69 hypoglycemic protocol and notify MD/NP 70-120 = 0 units, 121-150 = 1 unit, 151-200 = 3 units, 201-250 = 3 units; 251-300 = 5 units, 301-350 = 7 units; 351-400 = 9 units; >400 notify MD/NP     insulin aspart 100 UNIT/ML injection  Commonly known as:  NOVOLOG  Inject 3 Units into the skin 3 (three) times daily with meals.     multivitamin with minerals Tabs tablet  Take 1 tablet by mouth daily.     mupirocin ointment 2 %  Commonly known as:  BACTROBAN  Place 1 application into the nose 2 (two) times daily.     nitroGLYCERIN 0.4 MG SL tablet  Commonly known as:  NITROSTAT  Place 1 tablet (0.4 mg total) under the tongue every 5 (five) minutes as needed for chest pain.     silver sulfADIAZINE 1 % cream  Commonly known as:  SILVADENE  Apply 1 application topically 2 (two) times daily.     UNABLE TO FIND  Med Name: Med Pass 120 mL BID between  meals for supplement        Immunizations: Immunization History  Administered Date(s) Administered  . PPD Test 06/05/2015, 05/02/2016  . Pneumococcal Polysaccharide-23 04/04/2014, 01/13/2016     Physical Exam: Filed Vitals:   05/06/16 1216  BP: 111/58  Pulse: 93  Temp: 98.8 F (37.1 C)  TempSrc: Oral  Resp: 16  Height: 5\' 6"  (1.676 m)  Weight: 160 lb (72.576 kg)  SpO2: 97%   Body mass index is 25.84 kg/(m^2).  General- elderly frail male, in no acute distress Head- normocephalic, atraumatic Nose- no maxillary or frontal sinus tenderness, no nasal discharge Throat- moist mucus membrane  Eyes- PERRLA, EOMI, no pallor, no icterus, no discharge, normal conjunctiva, normal sclera Neck- no cervical lymphadenopathy Cardiovascular- normal s1,s2, no murmur Respiratory- bilateral clear to auscultation, no wheeze, no rhonchi, no crackles, no use of accessory muscles Abdomen- bowel sounds present, soft, non tender, distended Musculoskeletal- able to move all 4 extremities, generalized weakness Neurological- alert and oriented to person only Skin- warm and dry, dressing to both his lower legs Psychiatry- somewhat flat affect    Labs reviewed: Basic Metabolic Panel:  Recent Labs  04/25/16 0216  04/28/16 0347 04/29/16 0511 04/30/16 0619  NA 134*  < > 130* 132* 131*  K 4.9  < > 5.7* 5.1 4.8  CL 94*  < > 85* 96* 94*  CO2 24  < > 20* 22 25  GLUCOSE 130*  < > 159* 132* 170*  BUN 41*  < > 80* 45* 37*  CREATININE 4.68*  < > 7.40* 5.22* 4.35*  CALCIUM 9.9  < > 10.2 8.7* 9.0  MG 2.1  --   --   --   --   PHOS 5.3*  < > 7.7* 6.0* 5.4*  < > = values in this interval not displayed. Liver Function Tests:  Recent Labs  04/24/16 0710  04/25/16 1000  04/27/16 0623 04/28/16 0347 04/29/16 0511 04/30/16 0619  AST 25  --  26  --  23  --   --   --   ALT 20  --  19  --  17  --   --   --   ALKPHOS 145*  --  137*  --  151*  --   --   --   BILITOT 1.8*  --  1.7*  --  1.3*  --    --   --   PROT 7.2  --  7.6  --  6.6  --   --   --   ALBUMIN 3.0*  < > 2.9*  < > 2.5*  2.5* 2.5* 2.5* 2.3*  < > = values in this interval not displayed. No results for input(s): LIPASE, AMYLASE in the last 8760 hours. No results for input(s): AMMONIA in the last 8760 hours. CBC:  Recent Labs  04/25/16 0216 04/26/16 0527 04/27/16 0623 04/28/16 0347 04/29/16 0511 04/30/16 0609  WBC 17.2* 13.5* 12.9* 13.9* 12.3* 13.8*  NEUTROABS 14.5* 11.3* 10.6*  --   --   --   HGB 9.8* 10.1* 10.2* 10.0* 9.8* 10.4*  HCT 30.8* 31.6* 31.6* 30.9* 30.6* 33.1*  MCV 101.3* 100.0 99.1 97.8 98.4 103.1*  PLT 164 146* 154 146* 156 132*   Cardiac Enzymes:  Recent Labs  04/24/16 0710 04/24/16 1353 04/24/16 1822  TROPONINI 0.34* 0.39* 0.40*   BNP: Invalid input(s): POCBNP CBG:  Recent Labs  05/02/16 1146 05/02/16 1703 05/02/16 2017  GLUCAP 193* 180* 155*    Radiological Exams: Dg Chest  2 View  04/27/2016  CLINICAL DATA:  Pleural effusion; Pt reports cough "for a while"; he denies CP or SOB; h/o ICD insertion, HTN, and DM; non-smoker EXAM: CHEST - 2 VIEW COMPARISON:  04/25/2016 FINDINGS: Moderate right and smaller left pleural effusions. Adjacent consolidation/ atelectasis in the lower lobes. Some improvement in right mid lung aeration. Improvement in interstitial edema. Stable tunneled right IJ hemodialysis catheter, and left subclavian AICD. Cardiomegaly with extensive coronary calcifications. Atheromatous aorta. Visualized bones unremarkable. IMPRESSION: 1. Interval improvement in interstitial edema and right midlung infiltrate 2. Persistent pleural effusions and bibasilar atelectasis/consolidation. 3.  Aortic Atherosclerosis (ICD10-170.0) Electronically Signed   By: Lucrezia Europe M.D.   On: 04/27/2016 10:41   Dg Chest 2 View  04/24/2016  CLINICAL DATA:  Dyspnea.  Lower extremity pain and swelling. EXAM: CHEST  2 VIEW COMPARISON:  04/17/2016 FINDINGS: Dual-lumen right jugular central line extending  to the cavoatrial junction. Intact transvenous cardiac leads. Unchanged cardiomegaly. Slightly enlarged right pleural effusion. Unchanged small left effusion. Mild airspace opacity in the central right lung and in the right lung base, worsened. This may represent asymmetric alveolar edema. Infectious infiltrate not excluded. IMPRESSION: Mildly worsened central and basilar right lung opacities. This could represent asymmetric alveolar edema or infectious infiltrate. Right pleural effusion is slightly enlarged from 04/17/2016. Electronically Signed   By: Andreas Newport M.D.   On: 04/24/2016 01:53   Dg Chest 2 View  04/17/2016  CLINICAL DATA:  Asthma.  History of MI, CHF. EXAM: CHEST  2 VIEW COMPARISON:  03/14/2016. FINDINGS: Dual-lumen right IJ catheter in stable position. Cardiac pacer stable position. Stable cardiomegaly. Coronary artery calcification. Low lung volumes with bibasilar atelectasis and/or mild infiltrates. Small bilateral pleural effusions. IMPRESSION: 1. Dual-lumen right IJ catheter in stable position . 2. Cardiac pacer in stable position. Coronary artery disease. Cardiomegaly with bibasilar atelectasis and/or infiltrates and bilateral pleural effusions unchanged. Electronically Signed   By: Marcello Moores  Register   On: 04/17/2016 09:54   Ct Chest Wo Contrast  04/24/2016  CLINICAL DATA:  Further evaluate pleural effusion. Cough. History of MI in 2016. History of hypertension. EXAM: CT CHEST WITHOUT CONTRAST TECHNIQUE: Multidetector CT imaging of the chest was performed following the standard protocol without IV contrast. COMPARISON:  04/24/2016 FINDINGS: Cardiovascular: Extensive coronary artery calcifications are present. Heart is mildly enlarged. No pericardial effusion. Left-sided AICD leads extend into the right atrium and right ventricle. A a right-sided dialysis catheter tip extends to the level of the SVC right atrial junction. Mediastinum/Nodes: The visualized portion of the thyroid gland  has a normal appearance. Small mediastinal lymph nodes are present. Largest is identified in the region the precarinal station, measuring approximate 1.0 cm. Lungs/Pleura: There are bilateral pleural effusions, right greater than left. Bibasilar atelectasis is present. Parenchymal calcifications are identified within the lung bases, right greater than left likely representing calcified granulomata. No suspicious pulmonary nodules are identified. There is smooth septal thickening in the lungs, consistent with mild pulmonary edema. Upper Abdomen: Nodular contour of the liver raising the question of cirrhosis. Calcified granulomata are identified within the liver. There is dense atherosclerotic calcification of the abdominal aorta and its branches. Ascites. Calcified gallstone or gallstones. Musculoskeletal: Mild mid thoracic spondylosis. No suspicious lytic or blastic lesions are identified. IMPRESSION: 1. Small to moderate right pleural effusion. 2. Small left pleural effusion. 3. Bibasilar atelectasis. 4. Coronary artery disease and mild cardiomegaly. 5. Catheters and pacemaker as described. 6. Abdominal ascites and probable cirrhosis. 7. Cholelithiasis. Electronically Signed   By:  Nolon Nations M.D.   On: 04/24/2016 15:17   US Abdomen Limited  04/24/2016  CLINICAL DATA:  Abdominal distention for 1 year. Evaluate for ascites. EXAM: LIMITED ABDOMEN ULTRASOUND FOR ASCITES TECHNIQUE: Limited ultrasound survey for ascites was performed in all four abdominal quadrants. COMPARISON:  11/21/2015 CT FINDINGS: Ultrasound is performed of the 4 quadrants, demonstrating moderate ascites throughout the abdomen and pelvis. IMPRESSION: Moderate ascites. Electronically Signed   By: Nolon Nations M.D.   On: 04/24/2016 15:29   US Paracentesis  04/26/2016  INDICATION: 65 year old with a history of congestive heart failure as well as end-stage renal disease on hemodialysis. He has developed abdominal ascites and a request is  made for diagnostic and therapeutic paracentesis. EXAM: ULTRASOUND GUIDED DIAGNOSTIC AND THERAPEUTIC PARACENTESIS MEDICATIONS: 1% lidocaine COMPLICATIONS: None immediate. PROCEDURE: Informed written consent was obtained from the patient after a discussion of the risks, benefits and alternatives to treatment. A timeout was performed prior to the initiation of the procedure. Initial ultrasound scanning demonstrates a large amount of ascites within the right upper abdominal quadrant. The right upper abdomen was prepped and draped in the usual sterile fashion. 1% lidocaine was used for local anesthesia. Following this, a 19 gauge, 7-cm, Yueh catheter was introduced. An ultrasound image was saved for documentation purposes. The paracentesis was performed. The catheter was removed and a dressing was applied. The patient tolerated the procedure well without immediate post procedural complication. FINDINGS: A total of approximately 6 L of tan chylous colored fluid was removed. Samples were sent to the laboratory as requested by the clinical team. IMPRESSION: Successful ultrasound-guided paracentesis yielding 6 liters of peritoneal fluid. We stopped at 6 L as this is a first time paracentesis maximum. Read by: Saverio Danker, PA-C Electronically Signed   By: Lucrezia Europe M.D.   On: 04/25/2016 16:46   Dg Chest Port 1 View  04/25/2016  CLINICAL DATA:  Cough, shortness of Breath EXAM: PORTABLE CHEST 1 VIEW COMPARISON:  04/24/2016 FINDINGS: There is moderate partially loculated right pleural effusion. Atelectasis or infiltrate right lower lobe. No pulmonary edema. Dual lead cardiac pacemaker with leads in right atrium and right ventricle. Right IJ dialysis catheter with tip in distal SVC. Small left pleural effusion with left basilar atelectasis. IMPRESSION: Moderate partial loculated right pleural effusion, atelectasis or infiltrate in right lower lobe. No pulmonary edema. Small left pleural effusion with left basilar  atelectasis. Electronically Signed   By: Lahoma Crocker M.D.   On: 04/25/2016 14:41   Dg Chest Port 1 View  04/24/2016  CLINICAL DATA:  Dyspnea and weakness EXAM: PORTABLE CHEST 1 VIEW COMPARISON:  04/24/2016 FINDINGS: Intact transvenous cardiac leads. Dual-lumen right jugular central line extends to the cavoatrial junction. Moderate cardiomegaly persists, unchanged. Moderate right pleural effusion persists, unchanged or mildly larger. Central and basilar right lung opacities have mildly worsened. IMPRESSION: Mild worsening of right central airspace opacity and probable continued enlargement of right pleural effusion. This likely represents congestive heart failure with asymmetric alveolar edema. Electronically Signed   By: Andreas Newport M.D.   On: 04/24/2016 03:28   Dg Tibia/fibula Left Port  04/24/2016  CLINICAL DATA:  Left lower extremity pain and swelling for 2 days. No trauma. EXAM: PORTABLE LEFT TIBIA AND FIBULA - 2 VIEW COMPARISON:  None. FINDINGS: Negative for fracture dislocation. There is no bone lesion or bony destruction. There are extensive vascular calcifications. No soft tissue gas or foreign body IMPRESSION: Negative Electronically Signed   By: Andreas Newport M.D.   On:  04/24/2016 03:55    Assessment/Plan  Physical deconditioning With weakness. Will have him work with physical therapy and occupational therapy team to help with gait training and muscle strengthening exercises.fall precautions. Skin care. Encourage to be out of bed. Given his medical co-morbidities and deconditioning, get palliative care consult  HCAP Completed his antibiotics. Monitor clinically. Continue his prn breathing treatment. 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 goal calcium for patient 7-8. Continue thiosulfate at dialysis. To continue wound care. Has pending appoitnment with wound centre and urology. Continue tylenol as needed for pain and  change his fentanyl patch to 25 mcg q3d.  Chronic systolic and diastolic CHF  2-D echo from 11/29/15 with EF of 15-20%. Recent chf exacerbation. Continue coreg 3.125 mg bid. Monitor weight. Fluid restriction. Continue dialysis.   Ascites S/p paracentesis with 6l fluid removal. Monitor for worsening symptom. Poor overall prognosis  Atrial fibrillation Rate controlled. Off warfarin. Continue apixaban for anticoagulation and coreg for rate control. Continue amiodarone  Hyponatremia Encourage po intake. Monitor bmp  Protein calorie malnutrition Get RD consult and encourage po intake, monitor weight  Leukocytosis Recently treated for HCAP. Monitor cbc and temp curve  Anemia of chronic disease Monitor cbc  ESRD On HD 3 days a week  DM type 2 with renal complication Lab Results  Component Value Date   HGBA1C 5.9* 04/27/2016   Monitor cbg. Continue novolog 3 u tid with meals. Continue HD and statin  Neuropathic pain Continue gabapentin at bedtime and monitor  Hyperlipidemia  continue atorvastatin 40 mg daily   CAD Chest pain free. continue Plavix, statin, coreg and prn NTG.    Goals of care: short term rehabilitation   Labs/tests ordered: cbc, cmp   Family/ staff Communication: reviewed care plan with patient, his son and nursing supervisor    Blanchie Serve, MD Internal Medicine [place], [place] [address] Cell Phone (Monday-Friday 8 am - 5 pm): [phone] On Call: [phone] and follow prompts after 5 pm and on weekends Office Phone: [phone] Office Fax: [phone]

## 2016-05-09 ENCOUNTER — Encounter: Payer: Medicare HMO | Attending: General Surgery | Admitting: General Surgery

## 2016-05-09 DIAGNOSIS — N186 End stage renal disease: Secondary | ICD-10-CM | POA: Insufficient documentation

## 2016-05-09 DIAGNOSIS — L97221 Non-pressure chronic ulcer of left calf limited to breakdown of skin: Secondary | ICD-10-CM | POA: Diagnosis not present

## 2016-05-09 DIAGNOSIS — K7460 Unspecified cirrhosis of liver: Secondary | ICD-10-CM | POA: Diagnosis not present

## 2016-05-09 DIAGNOSIS — D631 Anemia in chronic kidney disease: Secondary | ICD-10-CM | POA: Insufficient documentation

## 2016-05-09 DIAGNOSIS — E1122 Type 2 diabetes mellitus with diabetic chronic kidney disease: Secondary | ICD-10-CM | POA: Diagnosis not present

## 2016-05-09 DIAGNOSIS — F0390 Unspecified dementia without behavioral disturbance: Secondary | ICD-10-CM | POA: Insufficient documentation

## 2016-05-09 DIAGNOSIS — L98491 Non-pressure chronic ulcer of skin of other sites limited to breakdown of skin: Secondary | ICD-10-CM | POA: Diagnosis not present

## 2016-05-09 DIAGNOSIS — Z794 Long term (current) use of insulin: Secondary | ICD-10-CM | POA: Diagnosis not present

## 2016-05-09 DIAGNOSIS — R739 Hyperglycemia, unspecified: Secondary | ICD-10-CM | POA: Diagnosis not present

## 2016-05-09 DIAGNOSIS — G629 Polyneuropathy, unspecified: Secondary | ICD-10-CM | POA: Diagnosis not present

## 2016-05-09 DIAGNOSIS — Z87891 Personal history of nicotine dependence: Secondary | ICD-10-CM | POA: Insufficient documentation

## 2016-05-09 DIAGNOSIS — Z992 Dependence on renal dialysis: Secondary | ICD-10-CM | POA: Diagnosis not present

## 2016-05-09 DIAGNOSIS — L97211 Non-pressure chronic ulcer of right calf limited to breakdown of skin: Secondary | ICD-10-CM | POA: Insufficient documentation

## 2016-05-09 DIAGNOSIS — N184 Chronic kidney disease, stage 4 (severe): Secondary | ICD-10-CM

## 2016-05-09 DIAGNOSIS — N485 Ulcer of penis: Secondary | ICD-10-CM

## 2016-05-09 DIAGNOSIS — I509 Heart failure, unspecified: Secondary | ICD-10-CM | POA: Insufficient documentation

## 2016-05-09 DIAGNOSIS — N179 Acute kidney failure, unspecified: Secondary | ICD-10-CM | POA: Diagnosis not present

## 2016-05-09 DIAGNOSIS — I132 Hypertensive heart and chronic kidney disease with heart failure and with stage 5 chronic kidney disease, or end stage renal disease: Secondary | ICD-10-CM | POA: Diagnosis not present

## 2016-05-09 DIAGNOSIS — M1990 Unspecified osteoarthritis, unspecified site: Secondary | ICD-10-CM | POA: Insufficient documentation

## 2016-05-09 LAB — CBC AND DIFFERENTIAL
HCT: 24 % — AB (ref 41–53)
HEMOGLOBIN: 7.3 g/dL — AB (ref 13.5–17.5)
Neutrophils Absolute: 9 /uL
PLATELETS: 241 10*3/uL (ref 150–399)
WBC: 11.1 10*3/mL

## 2016-05-09 LAB — BASIC METABOLIC PANEL
BUN: 65 mg/dL — AB (ref 4–21)
CREATININE: 6.8 mg/dL — AB (ref 0.6–1.3)
GLUCOSE: 102 mg/dL
POTASSIUM: 5 mmol/L (ref 3.4–5.3)
Sodium: 135 mmol/L — AB (ref 137–147)

## 2016-05-09 NOTE — Progress Notes (Signed)
Diabetic, on dialysis and has calciphylaxsis.  Leg ulcers and head of penis is gangrenous.  Will Rx penis with silvadine.

## 2016-05-10 NOTE — Progress Notes (Addendum)
CODEY, TRICKEY ([id number]) Visit Report for 05/09/2016 Allergy List Details Patient Name: Tony Freeman. Date of Service: 05/09/2016 8:45 AM Medical Record Number: [id number] Patient Account Number: [id number] Date of Birth/Sex: 1951/05/16 (65 y.o. Male) Treating RN: Baruch Gouty, RN, BSN, Velva Harman Primary Care Physician: Rico Junker Other Clinician: Referring Physician: Blanchie Serve Treating Physician/Extender: Benjaman Pott in Treatment: 0 Allergies Active Allergies no known allergies Allergy Notes Electronic Signature(s) Signed: 05/09/2016 3:42:51 PM By: Regan Lemming BSN, RN Entered By: Regan Lemming on 05/09/2016 09:05:24 Tony Freeman ([id number]) -------------------------------------------------------------------------------- Arrival Information Details Patient Name: Tony Freeman. Date of Service: 05/09/2016 8:45 AM Medical Record Number: [id number] Patient Account Number: [id number] Date of Birth/Sex: [birth year] (65 y.o. Male) Treating RN: Baruch Gouty, RN, BSN, Velva Harman Primary Care Physician: Rico Junker Other Clinician: Referring Physician: Blanchie Serve Treating Physician/Extender: Benjaman Pott in Treatment: 0 Visit Information Patient Arrived: Wheel Chair Arrival Time: 09:01 Accompanied By: son Transfer Assistance: None Patient Identification Verified: Yes Secondary Verification Process Yes Completed: Patient Requires Transmission- No Based Precautions: Patient Has Alerts: Yes Patient Alerts: On HEMO NO ABI, Painful Legs Electronic Signature(s) Signed: 05/09/2016 12:38:50 PM By: Regan Lemming BSN, RN Entered By: Regan Lemming on 05/09/2016 12:38:50 Tony Freeman ([id number]) -------------------------------------------------------------------------------- Clinic Level of Care Assessment Details Patient Name: Tony Freeman. Date of Service: 05/09/2016 8:45 AM Medical Record Number: [id number] Patient Account Number: [id number] Date  of Birth/Sex: 04/17/1951 (65 y.o. Male) Treating RN: Baruch Gouty, RN, BSN, Velva Harman Primary Care Physician: Rico Junker Other Clinician: Referring Physician: Blanchie Serve Treating Physician/Extender: Benjaman Pott in Treatment: 0 Clinic Level of Care Assessment Items TOOL 2 Quantity Score []  - Use when only an EandM is performed on the INITIAL visit 0 ASSESSMENTS - Nursing Assessment / Reassessment X - General Physical Exam (combine w/ comprehensive assessment (listed just 1 20 below) when performed on new pt. evals) X - Comprehensive Assessment (HX, ROS, Risk Assessments, Wounds Hx, etc.) 1 25 ASSESSMENTS - Wound and Skin Assessment / Reassessment []  - Simple Wound Assessment / Reassessment - one wound 0 X - Complex Wound Assessment / Reassessment - multiple wounds 4 5 []  - Dermatologic / Skin Assessment (not related to wound area) 0 ASSESSMENTS - Ostomy and/or Continence Assessment and Care []  - Incontinence Assessment and Management 0 []  - Ostomy Care Assessment and Management (repouching, etc.) 0 PROCESS - Coordination of Care []  - Simple Patient / Family Education for ongoing care 0 X - Complex (extensive) Patient / Family Education for ongoing care 1 20 []  - Staff obtains Programmer, systems, Records, Test Results / Process Orders 0 []  - Staff telephones HHA, Nursing Homes / Clarify orders / etc 0 []  - Routine Transfer to another Facility (non-emergent condition) 0 []  - Routine Hospital Admission (non-emergent condition) 0 []  - New Admissions / Biomedical engineer / Ordering NPWT, Apligraf, etc. 0 []  - Emergency Hospital Admission (emergent condition) 0 []  - Simple Discharge Coordination 0 [name], [name] ([id number]) []  - Complex (extensive) Discharge Coordination 0 PROCESS - Special Needs []  - Pediatric / Minor Patient Management 0 []  - Isolation Patient Management 0 []  - Hearing / Language / Visual special needs 0 []  - Assessment of Community assistance (transportation,  D/C planning, etc.) 0 []  - Additional assistance / Altered mentation 0 []  - Support Surface(s) Assessment (bed, cushion, seat, etc.) 0 INTERVENTIONS - Wound Cleansing / Measurement X - Wound Imaging (photographs - any number of wounds) 1 5 []  - Wound Tracing (instead of photographs) 0 []  - Simple Wound Measurement -  one wound 0 X - Complex Wound Measurement - multiple wounds 4 5 []  - Simple Wound Cleansing - one wound 0 X - Complex Wound Cleansing - multiple wounds 4 5 INTERVENTIONS - Wound Dressings []  - Small Wound Dressing one or multiple wounds 0 []  - Medium Wound Dressing one or multiple wounds 0 X - Large Wound Dressing one or multiple wounds 4 20 []  - Application of Medications - injection 0 INTERVENTIONS - Miscellaneous []  - External ear exam 0 []  - Specimen Collection (cultures, biopsies, blood, body fluids, etc.) 0 []  - Specimen(s) / Culture(s) sent or taken to Lab for analysis 0 []  - Patient Transfer (multiple staff / Civil Service fast streamer / Similar devices) 0 []  - Simple Staple / Suture removal (25 or less) 0 []  - Complex Staple / Suture removal (26 or more) 0 [name], [name] ([id number]) []  - Hypo / Hyperglycemic Management (close monitor of Blood Glucose) 0 []  - Ankle / Brachial Index (ABI) - do not check if billed separately 0 Has the patient been seen at the hospital within the last three years: Yes Total Score: 210 Level Of Care: New/Established - Level 5 Electronic Signature(s) Signed: 05/09/2016 3:42:51 PM By: Regan Lemming BSN, RN Entered By: Regan Lemming on 05/09/2016 12:41:20 Tony Freeman ([id number]) -------------------------------------------------------------------------------- Encounter Discharge Information Details Patient Name: Tony Freeman. Date of Service: 05/09/2016 8:45 AM Medical Record Number: [id number] Patient Account Number: [id number] Date of Birth/Sex: 1951/06/29 (65 y.o. Male) Treating RN: Baruch Gouty, RN, BSN, Velva Harman Primary Care Physician:  Rico Junker Other Clinician: Referring Physician: Blanchie Serve Treating Physician/Extender: Benjaman Pott in Treatment: 0 Encounter Discharge Information Items Discharge Pain Level: 0 Discharge Condition: Stable Ambulatory Status: Wheelchair Discharge Destination: Nursing Home Transportation: Other Accompanied By: Son Schedule Follow-up Appointment: No Medication Reconciliation completed and provided to Patient/Care No [place] Day: Provided on Clinical Summary of Care: 05/09/2016 Form Type Recipient Paper Patient [facility] Electronic Signature(s) Signed: 05/09/2016 12:43:16 PM By: Regan Lemming BSN, RN Previous Signature: 05/09/2016 12:05:48 PM Version By: Judene Companion MD Previous Signature: 05/09/2016 10:23:08 AM Version By: Ruthine Dose Entered By: Regan Lemming on 05/09/2016 12:43:16 Leitzke, Janina Mayo ([id number]) -------------------------------------------------------------------------------- Lower Extremity Assessment Details Patient Name: BRENDYN, SLOMAN. Date of Service: 05/09/2016 8:45 AM Medical Record Number: [id number] Patient Account Number: [id number] Date of Birth/Sex: Feb 16, 1951 (65 y.o. Male) Treating RN: Baruch Gouty, RN, BSN, Velva Harman Primary Care Physician: Rico Junker Other Clinician: Referring Physician: Blanchie Serve Treating Physician/Extender: Benjaman Pott in Treatment: 0 Vascular Assessment Pulses: Posterior Tibial Doppler: [Left:Monophasic] [Right:Monophasic] Dorsalis Pedis Palpable: [Left:Yes] [Right:Yes] Doppler: [Left:Monophasic] [Right:Monophasic] Extremity colors, hair growth, and conditions: Extremity Color: [Left:Dusky] [Right:Dusky] Hair Growth on Extremity: [Left:No] [Right:No] Temperature of Extremity: [Left:Cool] [Right:Cool] Capillary Refill: [Left:< 3 seconds] [Right:< 3 seconds] Toe Nail Assessment Left: Right: Thick: Yes Yes Discolored: Yes Yes Deformed: No No Improper Length and Hygiene: No No Notes Unable to do ABIs,  due to pain in bilateral legs. Electronic Signature(s) Signed: 05/09/2016 9:33:10 AM By: Regan Lemming BSN, RN Entered By: Regan Lemming on 05/09/2016 09:33:10 AVA, FERRARIO ([id number]) -------------------------------------------------------------------------------- Multi Wound Chart Details Patient Name: Tony Freeman. Date of Service: 05/09/2016 8:45 AM Medical Record Number: [id number] Patient Account Number: [id number] Date of Birth/Sex: [birth year] (65 y.o. 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: [1:N/A] [2:N/A] [3:Diabetic Wound/Ulcer of the Lower Extremity] Comorbid History: [1:Anemia, Arrhythmia, Congestive Heart Failure, Congestive Heart Failure, Congestive Heart Failure, Hypertension, Cirrhosis , Hypertension, Cirrhosis , Hypertension, Cirrhosis , Type II Diabetes, End Stage Renal Disease, History of  pressure wounds, Osteoarthritis, Dementia, Neuropathy] [2:Anemia, Arrhythmia, Type II Diabetes, End Stage Renal Disease, History of pressure wounds, Osteoarthritis, Dementia, Neuropathy] [3:Anemia, Arrhythmia, Type II Diabetes, End Stage Renal Disease,  History of pressure wounds, Osteoarthritis, Dementia, Neuropathy] Date Acquired: [1:01/29/2016] [2:01/29/2016] [3:01/29/2016] Weeks of Treatment: [1:0] [2:0] [3:0] Wound Status: [1:Open]  [2:Open] [3:Open] Measurements L x W x D 5x5x0.1 [2:10x7x0.1] [3:18x26x0.1] (cm) Area (cm) : [1:19.635] [2:54.978] [3:367.566] Volume (cm) : [1:1.963] [2:5.498] [3:36.757] Classification: [1:Full Thickness Without Exposed Support Structures] [2:Partial Thickness] [3:Partial Thickness] HBO Classification: [1:N/A] [2:N/A] [3:Grade 1] Exudate Amount: [1:Large] [2:Large] [3:Medium] Exudate Type: [1:Serosanguineous] [2:Serosanguineous] [3:Serosanguineous] Exudate Color: [1:red, brown Yes] [2:red, brown No] [3:red, brown No] Foul Odor After Cleansing: Odor Anticipated Due to No N/A N/A Product Use: Wound Margin: Indistinct, nonvisible Distinct, outline attached Indistinct, nonvisible Granulation Amount: None Present (0%) Large (67-100%) None Present (0%) Granulation Quality: N/A Pink, Pale N/A Necrotic Amount: Large (67-100%) Small (1-33%) Large (67-100%) Necrotic Tissue: Adherent Slough Adherent [facility], Adherent Slough Exposed Structures: Fascia: No Fascia: No Fascia: No Fat: No Fat: No Fat: No Tendon: No Tendon: No Tendon: No Muscle: No Muscle: No Muscle: No Joint: No Joint: No Joint: No Bone: No Bone: No Bone: No Limited to Skin Limited to Skin Limited to Skin Breakdown Breakdown Breakdown Epithelialization: None None None Periwound Skin Texture: Edema: No Edema: No Edema: No Excoriation: No Excoriation: No Excoriation: No Induration: No Induration: No Induration: No Callus: No Callus: No Callus: No Crepitus: No Crepitus: No Crepitus: No Fluctuance: No Fluctuance: No Fluctuance: No Friable: No Friable: No Friable: No Rash: No Rash: No Rash: No Scarring: No Scarring: No Scarring: No Periwound Skin Moist: Yes Moist: Yes Moist: Yes Moisture: Maceration: No Maceration: No Maceration: No Dry/Scaly: No Dry/Scaly: No Dry/Scaly: No Periwound Skin Color: Atrophie Blanche: No Atrophie Blanche: No Atrophie Blanche: No Cyanosis: No Cyanosis:  No Cyanosis: No Ecchymosis: No Ecchymosis: No Ecchymosis: No Erythema: No Erythema: No Erythema: No Hemosiderin Staining: No Hemosiderin Staining: No Hemosiderin Staining: No Mottled: No Mottled: No Mottled: No Pallor: No Pallor: No Pallor: No Rubor: No Rubor: No Rubor: No Temperature: No Abnormality No Abnormality No Abnormality Tenderness on Yes Yes Yes Palpation: Wound Preparation: Ulcer Cleansing: Ulcer Cleansing: Ulcer Cleansing: Rinsed/Irrigated with Rinsed/Irrigated with Rinsed/Irrigated with Saline Saline Saline Topical Anesthetic Topical Anesthetic Topical Anesthetic Applied: Other: Lidocaine Applied: Other: lidocaine Applied: Other: lidocaine 4% 4% 4% Wound Number: 4 N/A N/A ANTHONEE, BORDEN ([id number]) Photos: No Photos N/A N/A Wound Location: Left Lower Leg - N/A N/A Circumfernential Wounding Event: Gradually Appeared N/A N/A Primary Etiology: Calciphylaxis N/A N/A Secondary Etiology: Diabetic Wound/Ulcer of N/A N/A the Lower Extremity Comorbid History: Anemia, Arrhythmia, N/A N/A Congestive Heart Failure, Hypertension, Cirrhosis , Type II Diabetes, End Stage Renal Disease, History of pressure wounds, Osteoarthritis, Dementia, Neuropathy Date Acquired: 01/29/2016 N/A N/A Weeks of Treatment: 0 N/A N/A Wound Status: Open N/A N/A Measurements L x W x D 20x26x0.1 N/A N/A (cm) Area (cm) : 408.407 N/A N/A Volume (cm) : 40.841 N/A N/A Classification: Full Thickness Without N/A N/A Exposed Support Structures HBO Classification: Grade  1 N/A N/A Exudate Amount: Medium N/A N/A Exudate Type: Serosanguineous N/A N/A Exudate Color: red, brown N/A N/A Foul Odor After No N/A N/A Cleansing: Odor Anticipated Due to N/A N/A N/A Product Use: Wound Margin: Indistinct, nonvisible N/A N/A Granulation Amount: Small (1-33%) N/A N/A Granulation Quality: Pink, Pale N/A N/A Necrotic Amount: Medium (34-66%) N/A N/A Necrotic Tissue: Eschar, Adherent Slough  N/A N/A Exposed Structures: Fascia: No N/A N/A Fat: No Tendon: No Muscle: No Joint: No Bone: No Limited to Skin Breakdown Epithelialization: None N/A N/A Periwound Skin Texture: Edema: No N/A N/A Excoriation: No Boch, Dakoda M. ([id number]) Induration: No Callus: No Crepitus: No Fluctuance: No Friable: No Rash: No Scarring: No Periwound Skin Moist: Yes N/A N/A Moisture: Maceration: No Dry/Scaly: No Periwound Skin Color: Atrophie Blanche: No N/A N/A Cyanosis: No Ecchymosis: No Erythema: No Hemosiderin Staining: No Mottled: No Pallor: No Rubor: No Temperature: No Abnormality N/A N/A Tenderness on Yes N/A N/A Palpation: Wound Preparation: Ulcer Cleansing: N/A N/A Rinsed/Irrigated with Saline Topical Anesthetic Applied: Other: lidocaine 4% Treatment Notes Electronic Signature(s) Signed: 05/09/2016 3:42:51 PM By: Regan Lemming BSN, RN Entered By: Regan Lemming on 05/09/2016 09:49:41 GASPAR, CUSANO ([id number]) -------------------------------------------------------------------------------- [place] Details Patient Name: MAYEN, ALSMAN. Date of Service: 05/09/2016 8:45 AM Medical Record Number: [id number] Patient Account Number: [id number] Date of Birth/Sex: Aug 14, 1951 (65 y.o. Male) Treating RN: Baruch Gouty, RN, BSN, Velva Harman Primary Care Physician: Rico Junker Other Clinician: Referring Physician: Blanchie Serve Treating Physician/Extender: Benjaman Pott in Treatment: 0 Active Inactive Electronic Signature(s) Signed: 06/24/2016 12:47:03 PM By: Gretta Cool RN, BSN, Kim RN, BSN Signed: 06/24/2016 3:28:09 PM By: Regan Lemming BSN, RN Previous Signature: 05/09/2016 3:42:51 PM Version By: Regan Lemming BSN, RN Entered By: Gretta Cool, RN, BSN, Kim on 05/22/2016 10:18:14 SAHEIM, GEARY ([id number]) -------------------------------------------------------------------------------- Pain Assessment Details Patient Name: DOROTEO, SYDNEY. Date of  Service: 05/09/2016 8:45 AM Medical Record Number: [id number] Patient Account Number: [id number] Date of Birth/Sex: 1951/08/12 (65 y.o. Male) Treating RN: Baruch Gouty, RN, BSN, Velva Harman Primary Care Physician: Rico Junker Other Clinician: Referring Physician: Blanchie Serve Treating Physician/Extender: Benjaman Pott in Treatment: 0 Active Problems Location of Pain Severity and Description of Pain Patient Has Paino Yes Site Locations Pain Location: Generalized Pain, Pain in Ulcers With Dressing Change: Yes Duration of the Pain. Constant / Intermittento Constant Character of Pain Describe the Pain: Tender Pain Management and Medication Current Pain Management: Rest: Yes Activity: Yes How does your pain impact your activities of daily livingo Sleep: Yes Bathing: Yes Appetite: Yes Relationship With Others: Yes Bladder Continence: Yes Emotions: Yes Bowel Continence: Yes Work: Yes Toileting: Yes Drive: Yes Dressing: Yes Hobbies: Yes Electronic Signature(s) Signed: 05/09/2016 3:42:51 PM By: Regan Lemming BSN, RN Entered By: Regan Lemming on 05/09/2016 09:02:21 Tony Freeman ([id number]) -------------------------------------------------------------------------------- Patient/Caregiver Education Details Patient Name: Tony Freeman. Date of Service: 05/09/2016 8:45 AM Medical Record Number: [id number] Patient Account Number: [id number] Date of Birth/Gender: Nov 17, 1950 (65 y.o. Male) Treating RN: Baruch Gouty, RN, BSN, Velva Harman Primary Care Physician: Rico Junker Other Clinician: Referring Physician: Blanchie Serve Treating Physician/Extender: Benjaman Pott in Treatment: 0 Education Assessment Education Provided To: Patient and Caregiver Education Topics Provided Basic Hygiene: Methods: Explain/Verbal Responses: State content correctly Pain: Methods: Explain/Verbal Responses: State content correctly Safety: Methods: Explain/Verbal Responses: State content  correctly Welcome To [facility]: Methods: Explain/Verbal Responses: State content correctly Wound/Skin Impairment: Methods: Explain/Verbal Responses: State content correctly Electronic Signature(s) Signed: 05/09/2016 3:42:51 PM By: Regan Lemming BSN, RN Entered By: Regan Lemming on  05/09/2016 12:43:48 GARRELL, LAVERS ([id number]) -------------------------------------------------------------------------------- Wound Assessment Details Patient Name: JOBY, WINT. Date of Service: 05/09/2016 8:45 AM Medical Record Number: [id number] Patient Account Number: [id number] Date of Birth/Sex: August 10, 1951 (65 y.o. Male) Treating RN: Baruch Gouty, RN, BSN, [place] Primary Care Physician: Rico Junker Other Clinician: Referring Physician: Blanchie Serve Treating Physician/Extender: Benjaman Pott in Treatment: 0 Wound Status Wound Number: 1 Primary Calciphylaxis Etiology: Wound Location: Penis - Circumfernential Wound Open Wounding Event: Gradually Appeared Status: Date Acquired: 01/29/2016 Comorbid Anemia, Arrhythmia, Congestive Heart Weeks Of Treatment: 0 History: Failure, Hypertension, Cirrhosis , Type II Clustered Wound: No Diabetes, End Stage Renal Disease, History of pressure wounds, Osteoarthritis, Dementia, Neuropathy Photos Photo Uploaded By: Regan Lemming on 05/09/2016 15:40:33 Wound Measurements Length: (cm) 5 % Reduction in Width: (cm) 5 % Reduction in Depth: (cm) 0.1 Epithelializat Area: (cm) 19.635 Tunneling: Volume: (cm) 1.963 Undermining: Area: Volume: ion: None No No Wound Description Full Thickness Without Exposed Foul Odor Aft Classification: Support Structures Due to Produc Wound Margin: Indistinct, nonvisible Exudate Large Amount: Exudate Type: Serosanguineous Exudate Color: red, brown er Cleansing: Yes t Use: No Wound Bed Damon, Elo M. ([id number]) Granulation Amount: None Present (0%) Exposed Structure Necrotic Amount:  Large (67-100%) Fascia Exposed: No Necrotic Quality: Adherent Slough Fat Layer Exposed: No Tendon Exposed: No Muscle Exposed: No Joint Exposed: No Bone Exposed: No Limited to Skin Breakdown Periwound Skin Texture Texture Color No Abnormalities Noted: No No Abnormalities Noted: No Callus: No Atrophie Blanche: No Crepitus: No Cyanosis: No Excoriation: No Ecchymosis: No Fluctuance: No Erythema: No Friable: No Hemosiderin Staining: No Induration: No Mottled: No Localized Edema: No Pallor: No Rash: No Rubor: No Scarring: No Temperature / Pain Moisture Temperature: No Abnormality No Abnormalities Noted: No Tenderness on Palpation: Yes Dry / Scaly: No Maceration: No Moist: Yes Wound Preparation Ulcer Cleansing: Rinsed/Irrigated with Saline Topical Anesthetic Applied: Other: Lidocaine 4%, Electronic Signature(s) Signed: 05/09/2016 3:42:51 PM By: Regan Lemming BSN, RN Entered By: Regan Lemming on 05/09/2016 09:39:00 JOSERAMON, ZUBA ([id number]) -------------------------------------------------------------------------------- Wound Assessment Details Patient Name: BEXTON, CHARRON. Date of Service: 05/09/2016 8:45 AM Medical Record Number: [id number] Patient Account Number: [id number] Date of Birth/Sex: [birth year] (65 y.o. Male) Treating RN: Baruch Gouty, RN, BSN, [place] Primary Care Physician: Rico Junker Other Clinician: Referring Physician: Blanchie Serve Treating Physician/Extender: Benjaman Pott in Treatment: 0 Wound Status Wound Number: 2 Primary Calciphylaxis Etiology: Wound Location: Sacrum Wound Open Wounding Event: Gradually Appeared Status: Date Acquired: 01/29/2016 Comorbid Anemia, Arrhythmia, Congestive Heart Weeks Of Treatment: 0 History: Failure, Hypertension, Cirrhosis , Type II Clustered Wound: No Diabetes, End Stage Renal Disease, History of pressure wounds, Osteoarthritis, Dementia, Neuropathy Photos Photo Uploaded By: Regan Lemming on  05/09/2016 15:40:34 Wound Measurements Length: (cm) 10 Width: (cm) 7 Depth: (cm) 0.1 Area: (cm) 54.978 Volume: (cm) 5.498 % Reduction in Area: % Reduction in Volume: Epithelialization: None Tunneling: No Undermining: No Wound Description Classification: Partial Thickness Foul Odor Aft Wound Margin: Distinct, outline attached Exudate Amount: Large Exudate Type: Serosanguineous Exudate Color: red, brown er Cleansing: No Wound Bed Granulation Amount: Large (67-100%) Exposed Structure Granulation Quality: Pink, Pale Fascia Exposed: No Garry, Shamell M. ([id number]) Necrotic Amount: Small (1-33%) Fat Layer Exposed: No Necrotic Quality: Adherent Slough Tendon Exposed: No Muscle Exposed: No Joint Exposed: No Bone Exposed: No Limited to Skin Breakdown Periwound Skin Texture Texture Color No Abnormalities Noted: No No Abnormalities Noted: No Callus: No Atrophie Blanche: No Crepitus: No Cyanosis: No Excoriation: No Ecchymosis: No Fluctuance: No Erythema: No Friable: No Hemosiderin Staining: No  Induration: No Mottled: No Localized Edema: No Pallor: No Rash: No Rubor: No Scarring: No Temperature / Pain Moisture Temperature: No Abnormality No Abnormalities Noted: No Tenderness on Palpation: Yes Dry / Scaly: No Maceration: No Moist: Yes Wound Preparation Ulcer Cleansing: Rinsed/Irrigated with Saline Topical Anesthetic Applied: Other: lidocaine 4%, Electronic Signature(s) Signed: 05/09/2016 3:42:51 PM By: Regan Lemming BSN, RN Entered By: Regan Lemming on 05/09/2016 09:40:45 Tony Freeman ([id number]) -------------------------------------------------------------------------------- Wound Assessment Details Patient Name: AUSTUN, DEBAERE. Date of Service: 05/09/2016 8:45 AM Medical Record Number: [id number] Patient Account Number: [id number] Date of Birth/Sex: 03/18/1951 (65 y.o. Male) Treating RN: Baruch Gouty, RN, BSN, Velva Harman Primary Care Physician: Rico Junker Other Clinician: Referring Physician: Blanchie Serve Treating Physician/Extender: Benjaman Pott in Treatment: 0 Wound Status Wound Number: 3 Primary Calciphylaxis Etiology: Wound Location: Right Lower Leg - Circumfernential Secondary Diabetic Wound/Ulcer of the Lower Etiology: Extremity Wounding Event: Gradually Appeared Wound Open Date Acquired: 01/29/2016 Status: Weeks Of Treatment: 0 Comorbid Anemia, Arrhythmia, Congestive Heart Clustered Wound: No History: Failure, Hypertension, Cirrhosis , Type II Diabetes, End Stage Renal Disease, History of pressure wounds, Osteoarthritis, Dementia, Neuropathy Photos Photo Uploaded By: Regan Lemming on 05/09/2016 15:41:10 Wound Measurements Length: (cm) 18 Width: (cm) 26 Depth: (cm) 0.1 Area: (cm) 367.566 Volume: (cm) 36.757 % Reduction in Area: % Reduction in Volume: Epithelialization: None Tunneling: No Undermining: No Wound Description Classification: Partial Thickness Diabetic Severity (Wagner): Grade 1 Wound Margin: Indistinct, nonvisible Exudate Amount: Medium Exudate Type: Serosanguineous Exudate Color: red, brown Hagey, Antar M. ([id number]) Foul Odor After Cleansing: No Wound Bed Granulation Amount: None Present (0%) Exposed Structure Necrotic Amount: Large (67-100%) Fascia Exposed: No Necrotic Quality: Eschar, Adherent Slough Fat Layer Exposed: No Tendon Exposed: No Muscle Exposed: No Joint Exposed: No Bone Exposed: No Limited to Skin Breakdown Periwound Skin Texture Texture Color No Abnormalities Noted: No No Abnormalities Noted: No Callus: No Atrophie Blanche: No Crepitus: No Cyanosis: No Excoriation: No Ecchymosis: No Fluctuance: No Erythema: No Friable: No Hemosiderin Staining: No Induration: No Mottled: No Localized Edema: No Pallor: No Rash: No Rubor: No Scarring: No Temperature / Pain Moisture Temperature: No Abnormality No Abnormalities Noted: No Tenderness on  Palpation: Yes Dry / Scaly: No Maceration: No Moist: Yes Wound Preparation Ulcer Cleansing: Rinsed/Irrigated with Saline Topical Anesthetic Applied: Other: lidocaine 4%, Electronic Signature(s) Signed: 05/09/2016 3:42:51 PM By: Regan Lemming BSN, RN Entered By: Regan Lemming on 05/09/2016 09:44:12 JOCK, MEELER ([id number]) -------------------------------------------------------------------------------- Wound Assessment Details Patient Name: MAXDEN, HAILU. Date of Service: 05/09/2016 8:45 AM Medical Record Number: [id number] Patient Account Number: [id number] Date of Birth/Sex: [birth year] (65 y.o. 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 Event: Gradually Appeared Wound Open Date Acquired: 01/29/2016 Status: Weeks Of Treatment: 0 Comorbid Anemia, Arrhythmia, Congestive Heart Clustered Wound: No History: Failure, Hypertension, Cirrhosis , Type II Diabetes, End Stage Renal Disease, History of pressure wounds, Osteoarthritis, Dementia, Neuropathy Photos Photo Uploaded By: Regan Lemming on 05/09/2016 15:41:11 Wound Measurements Length: (cm) 20 Width: (cm) 26 Depth: (cm) 0.1 Area: (cm) 408.407 Volume: (cm) 40.841 % Reduction in Area: % Reduction in Volume: Epithelialization: None Tunneling: No Undermining: No Wound Description KAHLAN, BUCCERI. ([id number]) Classification: Full Thickness Without Exposed Foul Odor After Cleansing: No Support Structures Diabetic Severity Grade 1 (Wagner): Wound Margin:  Indistinct, nonvisible Exudate Amount: Medium Exudate Type: Serosanguineous Exudate Color: red, brown Wound  Bed Granulation Amount: Small (1-33%) Exposed Structure Granulation Quality: Pink, Pale Fascia Exposed: No Necrotic Amount: Medium (34-66%) Fat Layer Exposed: No Necrotic Quality: Eschar, Adherent Slough Tendon Exposed: No Muscle Exposed: No Joint Exposed: No Bone Exposed: No Limited to Skin Breakdown Periwound Skin Texture Texture Color No Abnormalities Noted: No No Abnormalities Noted: No Callus: No Atrophie Blanche: No Crepitus: No Cyanosis: No Excoriation: No Ecchymosis: No Fluctuance: No Erythema: No Friable: No Hemosiderin Staining: No Induration: No Mottled: No Localized Edema: No Pallor: No Rash: No Rubor: No Scarring: No Temperature / Pain Moisture Temperature: No Abnormality No Abnormalities Noted: No Tenderness on Palpation: Yes Dry / Scaly: No Maceration: No Moist: Yes Wound Preparation Ulcer Cleansing: Rinsed/Irrigated with Saline Topical Anesthetic Applied: Other: lidocaine 4%, Electronic Signature(s) Signed: 05/09/2016 3:42:51 PM By: Regan Lemming BSN, RN Entered By: Regan Lemming on 05/09/2016 09:47:33 NICASIO, YARGER ([id number]) -------------------------------------------------------------------------------- Vitals Details Patient Name: Tony Freeman. Date of Service: 05/09/2016 8:45 AM Medical Record Number: [id number] Patient Account Number: [id number] Date of Birth/Sex: [birth year] (65 y.o. Male) Treating RN: Afful, RN, BSN, [place] Primary Care Physician: Rico Junker Other Clinician: Referring Physician: Blanchie Serve Treating Physician/Extender: Benjaman Pott in Treatment: 0 Vital Signs Time Taken: 09:11 Temperature (F): 98.7 Height (in): 66 Pulse (bpm): 81 Source: Stated Respiratory Rate (breaths/min): 16 Weight (lbs): 170 Blood Pressure (mmHg): 92/54 Source: Stated Reference Range: 80 - 120 mg / dl Body Mass Index (BMI): 27.4 Electronic Signature(s) Signed: 05/09/2016 3:42:51 PM By: Regan Lemming BSN, RN Entered  By: Regan Lemming on 05/09/2016 09:13:33

## 2016-05-10 NOTE — Progress Notes (Addendum)
Tony Freeman, Tony Freeman ([id number]) Visit Report for 05/09/2016 Chief Complaint Document Details Cecchi, Adil Date of Service: 05/09/2016 8:45 AM Patient Name: M. Patient Account Number: [id number] Medical Record Baruch Gouty, RN, BSN, [id number] Treating RN: Number: Velva Harman Date of Birth/Sex: [birth year] (65 y.o. Male) Other Clinician: Primary Care Physician: Burna Sis, Karah Caruthers Referring Physician: Blanchie Serve Physician/Extender: Suella Grove in Treatment: 0 Information Obtained from: Patient Electronic Signature(s) Signed: 05/09/2016 11:50:34 AM By: Judene Companion MD Entered By: Judene Companion on 05/09/2016 11:50:34 Tony Freeman, Tony Freeman ([id number]) -------------------------------------------------------------------------------- HPI Details Phimmasone, Cortlandt Date of Service: 05/09/2016 8:45 AM Patient Name: Tony Mages. Patient Account Number: [id number] Medical Record Baruch Gouty, RN, BSN, [id number] Treating RN: Number: Velva Harman Date of Birth/Sex: [birth year] (65 y.o. Male) Other Clinician: Primary Care Physician: Burnett Kanaris Referring Physician: Blanchie Serve Physician/Extender: Suella Grove in Treatment: 0 Electronic Signature(s) Signed: 05/09/2016 11:50:51 AM By: Judene Companion MD Entered By: Judene Companion on 05/09/2016 11:50:50 Tony Freeman, Tony Freeman ([id number]) -------------------------------------------------------------------------------- Physical Exam Details Yarrow, Pharaoh Date of Service: 05/09/2016 8:45 AM Patient Name: Tony Mages. Patient Account Number: [id number] Medical Record Baruch Gouty, RN, BSN, [id number] Treating RN: Number: Velva Harman Date of Birth/Sex: 26-Jun-1951 (65 y.o. Male) Other Clinician: Primary Care Physician: Burnett Kanaris Referring Physician: Blanchie Serve Physician/Extender: Suella Grove in Treatment: 0 Electronic Signature(s) Signed: 05/09/2016 11:51:05 AM By: Judene Companion MD Entered By: Judene Companion on 05/09/2016 11:51:05 Tony Freeman, Tony Freeman ([id number]) -------------------------------------------------------------------------------- Physician Orders Details Tony Freeman Date of Service: 05/09/2016 8:45 AM Patient Name: Tony Mages. Patient Account Number: [id number] Medical Record Afful, RN, BSN, [id number] Treating RN: Number: Velva Harman Date of Birth/Sex: [birth year] (65 y.o. Male) Other Clinician: Primary Care Physician: Burna Sis, [place] Referring Physician: Blanchie Serve Physician/Extender: Suella Grove in Treatment: 0 Verbal / Phone Orders: Yes Clinician: Afful, RN, BSN, Rita Read Back and Verified: Yes Diagnosis Coding Wound Cleansing Wound #1 Circumferential Penis o Cleanse wound with mild soap and water Wound #2 Sacrum o Cleanse wound with mild soap and water Wound #3 Right,Circumferential Lower Leg o Cleanse wound with mild soap and water Wound #4 Left,Circumferential Lower Leg o Cleanse wound with mild soap and water Primary Wound Dressing Wound #1 Circumferential Penis o Aquacel Ag Wound #2 Sacrum o Aquacel Ag Wound #3 Right,Circumferential Lower Leg o Aquacel Ag Wound #4 Left,Circumferential Lower Leg o Aquacel Ag Secondary Dressing Wound #1 Circumferential Penis o Gauze and Kerlix/Conform Wound #2 Sacrum o Gauze and Kerlix/Conform Wound #3 Right,Circumferential Lower Leg o Gauze and Kerlix/Conform Londo, Shiro M. ([id number]) Wound #4 Left,Circumferential Lower Leg o Gauze and Kerlix/Conform Dressing Change Frequency Wound #1 Circumferential Penis o Change dressing every day. Wound #2 Sacrum o Change dressing every day. Wound #3 Right,Circumferential Lower Leg o Change dressing every day. Wound #4 Left,Circumferential Lower Leg o Change dressing every day. Follow-up Appointments Wound #1 Circumferential Penis o Return Appointment in 1 week. - to see Dr.BRitto Wound #2 Sacrum o Return Appointment in 1 week. - to see Dr.BRitto Wound  #3 Right,Circumferential Lower Leg o Return Appointment in 1 week. - to see Dr.BRitto Wound #4 Left,Circumferential Lower Leg o Return Appointment in 1 week. - to see Dr.BRitto Additional Orders / Instructions Wound #1 Circumferential Penis o Increase protein intake. o Activity as tolerated Wound #2 Sacrum o Increase protein intake. o Activity as tolerated Wound #3 Right,Circumferential Lower Leg o Increase protein intake. o Activity as tolerated Wound #4 Left,Circumferential Lower Leg o Increase protein intake. o Activity as tolerated Tony Freeman, Tony Freeman ([id number]) Electronic Signature(s) Signed: 05/09/2016 3:42:51 PM By: Regan Lemming  BSN, RN Signed: 05/09/2016 4:57:40 PM By: Judene Companion MD Entered By: Regan Lemming on 05/09/2016 09:55:12 Tony Freeman, Tony Freeman ([id number]) -------------------------------------------------------------------------------- Problem List Details Tony Freeman Date of Service: 05/09/2016 8:45 AM Patient Name: Tony Mages. Patient Account Number: [id number] Medical Record Afful, RN, BSN, [id number] Treating RN: Number: Velva Harman Date of Birth/Sex: 11-08-1950 (65 y.o. 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/17/2016 Yes breakdown of skin L97.221 Non-pressure chronic ulcer of left calf limited to 05/01/2016 Yes breakdown of skin L98.491 Non-pressure chronic ulcer of skin of other sites limited to 05/03/2016 Yes breakdown of skin Inactive Problems Resolved Problems Electronic Signature(s) Signed: 05/23/2016 12:21:24 PM By: Judene Companion MD Previous Signature: 05/09/2016 11:50:22 AM Version By: Judene Companion MD Entered By: Judene Companion on 05/06/2016 12:21:24 Tony Freeman, Tony Freeman  ([id number]) -------------------------------------------------------------------------------- Progress Note Details Tony Freeman, Tony Freeman Date of Service: 05/09/2016 8:45 AM Patient Name: Tony Mages. Patient Account Number: [id number] Medical Record Baruch Gouty, RN, BSN, [id number] Treating RN: Number: Velva Harman Date of Birth/Sex: 29-Jul-1951 (65 y.o. Male) Other Clinician: Primary Care Physician: Rico Junker Treating Jerline Pain Brittian Renaldo Referring Physician: Blanchie Serve Physician/Extender: Suella Grove in Treatment: 0 Subjective Chief Complaint Information obtained from Patient Wound History Patient presents with 4 open wounds that have been present for approximately 3 month. Patient has been treating wounds in the following manner: dry dressing. Laboratory tests have not been performed in the last month. Patient reportedly has not tested positive for an antibiotic resistant organism. Patient reportedly has not tested positive for osteomyelitis. Patient reportedly has not had testing performed to evaluate circulation in the legs. Patient History Allergies no known allergies Social History Former smoker, Marital Status - Divorced, Alcohol Use - Never, Drug Use - No History, Caffeine Use - Moderate. Medical History Ear/Nose/Mouth/Throat Denies history of Chronic sinus problems/congestion, Middle ear problems Hematologic/Lymphatic Patient has history of Anemia Denies history of Hemophilia, Human Immunodeficiency Virus, Lymphedema, Sickle Cell Disease Respiratory Denies history of Aspiration, Asthma, Chronic Obstructive Pulmonary Disease (COPD), Pneumothorax, Sleep Apnea, Tuberculosis Cardiovascular Patient has history of Arrhythmia, Congestive Heart Failure, Hypertension Gastrointestinal Patient has history of Cirrhosis Denies history of Colitis, Crohn s, Hepatitis A, Hepatitis B, Hepatitis C Endocrine Patient has history of Type II Diabetes Genitourinary ARMONTE, GIRAUD ([id number]) Patient has history  of End Stage Renal Disease - HD t/thur/sat Immunological Denies history of Lupus Erythematosus, Raynaud s, Scleroderma Integumentary (Skin) Patient has history of History of pressure wounds Musculoskeletal Patient has history of Osteoarthritis Denies history of Rheumatoid Arthritis Neurologic Patient has history of Dementia, Neuropathy Denies history of Paraplegia, Seizure Disorder Oncologic Denies history of Received Chemotherapy, Received Radiation Psychiatric Denies history of Anorexia/bulimia, Confinement Anxiety Patient is treated with Insulin, Oral Agents. Blood sugar is tested. Blood sugar results noted at the following times: Breakfast - 145. Review of Systems (ROS) Constitutional Symptoms (General Health) The patient has no complaints or symptoms. Eyes Complains or has symptoms of Dry Eyes, Glasses / Contacts. Ear/Nose/Mouth/Throat The patient has no complaints or symptoms. Hematologic/Lymphatic The patient has no complaints or symptoms. Respiratory The patient has no complaints or symptoms. Cardiovascular Complains or has symptoms of LE edema. Gastrointestinal The patient has no complaints or symptoms. Endocrine The patient has no complaints or symptoms. Genitourinary Complains or has symptoms of Kidney failure/ Dialysis. Immunological The patient has no complaints or symptoms. Integumentary (Skin) Complains or has symptoms of Wounds, Breakdown,  Swelling. Musculoskeletal The patient has no complaints or symptoms. Neurologic Complains or has symptoms of Numbness/parasthesias. Oncologic The patient has no complaints or symptoms. Psychiatric The patient has no complaints or symptoms. Tony Freeman, Tony Freeman ([id number]) Objective Constitutional Vitals Time Taken: 9:11 AM, Height: 66 in, Source: Stated, Weight: 170 lbs, Source: Stated, BMI: 27.4, Temperature: 98.7 F, Pulse: 81 bpm, Respiratory Rate: 16 breaths/min, Blood Pressure: 92/54 mmHg. Integumentary  (Hair, Skin) Wound #1 status is Open. Original cause of wound was Gradually Appeared. The wound is located on the Circumferential Penis. The wound measures 5cm length x 5cm width x 0.1cm depth; 19.635cm^2 area and 1.963cm^3 volume. The wound is limited to skin breakdown. There is no tunneling or undermining noted. There is a large amount of serosanguineous drainage noted. The wound margin is indistinct and nonvisible. There is no granulation within the wound bed. There is a large (67-100%) amount of necrotic tissue within the wound bed including Adherent Slough. The periwound skin appearance exhibited: Moist. The periwound skin appearance did not exhibit: Callus, Crepitus, Excoriation, Fluctuance, Friable, Induration, Localized Edema, Rash, Scarring, Dry/Scaly, Maceration, Atrophie Blanche, Cyanosis, Ecchymosis, Hemosiderin Staining, Mottled, Pallor, Rubor, Erythema. Periwound temperature was noted as No Abnormality. The periwound has tenderness on palpation. Wound #2 status is Open. Original cause of wound was Gradually Appeared. The wound is located on the Sacrum. The wound measures 10cm length x 7cm width x 0.1cm depth; 54.978cm^2 area and 5.498cm^3 volume. The wound is limited to skin breakdown. There is no tunneling or undermining noted. There is a large amount of serosanguineous drainage noted. The wound margin is distinct with the outline attached to the wound base. There is large (67-100%) pink, pale granulation within the wound bed. There is a small (1-33%) amount of necrotic tissue within the wound bed including Adherent Slough. The periwound skin appearance exhibited: Moist. The periwound skin appearance did not exhibit: Callus, Crepitus, Excoriation, Fluctuance, Friable, Induration, Localized Edema, Rash, Scarring, Dry/Scaly, Maceration, Atrophie Blanche, Cyanosis, Ecchymosis, Hemosiderin Staining, Mottled, Pallor, Rubor, Erythema. Periwound temperature was noted as No Abnormality.  The periwound has tenderness on palpation. Wound #3 status is Open. Original cause of wound was Gradually Appeared. The wound is located on the Right,Circumferential Lower Leg. The wound measures 18cm length x 26cm width x 0.1cm depth; 367.566cm^2 area and 36.757cm^3 volume. The wound is limited to skin breakdown. There is no tunneling or undermining noted. There is a medium amount of serosanguineous drainage noted. The wound margin is indistinct and nonvisible. There is no granulation within the wound bed. There is a large (67-100%) amount of necrotic tissue within the wound bed including Eschar and Adherent Slough. The periwound skin appearance exhibited: Moist. The periwound skin appearance did not exhibit: Callus, Crepitus, Excoriation, Fluctuance, Friable, Induration, Localized Edema, Rash, Scarring, Dry/Scaly, Maceration, Atrophie Blanche, Cyanosis, Ecchymosis, Hemosiderin Staining, Mottled, Pallor, Rubor, Erythema. Periwound temperature was noted as No Abnormality. The periwound has tenderness on palpation. Wound #4 status is Open. Original cause of wound was Gradually Appeared. The wound is located on the Left,Circumferential Lower Leg. The wound measures 20cm length x 26cm width x 0.1cm depth; 408.407cm^2 area and 40.841cm^3 volume. The wound is limited to skin breakdown. There is no tunneling Fenoglio, Corbyn M. ([id number]) or undermining noted. There is a medium amount of serosanguineous drainage noted. The wound margin is indistinct and nonvisible. There is small (1-33%) pink, pale granulation within the wound bed. There is a medium (34-66%) amount of necrotic tissue within the wound bed including Eschar and Adherent Slough. The  periwound skin appearance exhibited: Moist. The periwound skin appearance did not exhibit: Callus, Crepitus, Excoriation, Fluctuance, Friable, Induration, Localized Edema, Rash, Scarring, Dry/Scaly, Maceration, Atrophie Blanche, Cyanosis, Ecchymosis,  Hemosiderin Staining, Mottled, Pallor, Rubor, Erythema. Periwound temperature was noted as No Abnormality. The periwound has tenderness on palpation. Assessment Active Problems ICD-10 L97.211 - Non-pressure chronic ulcer of right calf limited to breakdown of skin L97.221 - Non-pressure chronic ulcer of left calf limited to breakdown of skin L98.491 - Non-pressure chronic ulcer of skin of other sites limited to breakdown of skin 65 yr old diabetic with end stage renal disease. Has calciphylaxsis with ulcers bilateral legs. Main problem is gangrenous changes to head of penis. Will try silver collagen to wounds Plan Wound Cleansing: Wound #1 Circumferential Penis: Cleanse wound with mild soap and water Wound #2 Sacrum: Cleanse wound with mild soap and water Wound #3 Right,Circumferential Lower Leg: Cleanse wound with mild soap and water Wound #4 Left,Circumferential Lower Leg: Cleanse wound with mild soap and water Primary Wound Dressing: Wound #1 Circumferential Penis: Aquacel Ag Wound #2 Sacrum: Aquacel Ag Wound #3 Right,Circumferential Lower Leg: Aquacel Ag Wound #4 Left,Circumferential Lower Leg: Aquacel Ag Tony Freeman, Tony Freeman ([id number]) Secondary Dressing: Wound #1 Circumferential Penis: Gauze and Kerlix/Conform Wound #2 Sacrum: Gauze and Kerlix/Conform Wound #3 Right,Circumferential Lower Leg: Gauze and Kerlix/Conform Wound #4 Left,Circumferential Lower Leg: Gauze and Kerlix/Conform Dressing Change Frequency: Wound #1 Circumferential Penis: Change dressing every day. Wound #2 Sacrum: Change dressing every day. Wound #3 Right,Circumferential Lower Leg: Change dressing every day. Wound #4 Left,Circumferential Lower Leg: Change dressing every day. Follow-up Appointments: Wound #1 Circumferential Penis: Return Appointment in 1 week. - to see Dr.BRitto Wound #2 Sacrum: Return Appointment in 1 week. - to see Dr.BRitto Wound #3 Right,Circumferential Lower  Leg: Return Appointment in 1 week. - to see Dr.BRitto Wound #4 Left,Circumferential Lower Leg: Return Appointment in 1 week. - to see Dr.BRitto Additional Orders / Instructions: Wound #1 Circumferential Penis: Increase protein intake. Activity as tolerated Wound #2 Sacrum: Increase protein intake. Activity as tolerated Wound #3 Right,Circumferential Lower Leg: Increase protein intake. Activity as tolerated Wound #4 Left,Circumferential Lower Leg: Increase protein intake. Activity as tolerated Follow-Up Appointments: A Patient Clinical Summary of Care was provided to [place] ([id number]) Electronic Signature(s) Signed: 04/27/2016 12:22:04 PM By: Judene Companion MD Previous Signature: 05/14/2016 4:14:08 PM Version By: Judene Companion MD Previous Signature: 05/09/2016 11:55:38 AM Version By: Judene Companion MD Entered By: Judene Companion on 05/06/2016 12:22:04 Tony Freeman, Tony Freeman ([id number]) -------------------------------------------------------------------------------- ROS/PFSH Details Pierron, Dontai Date of Service: 05/09/2016 8:45 AM Patient Name: M. Patient Account Number: [id number] Medical Record Afful, RN, BSN, [id number] Treating RN: Number: Velva Harman Date of Birth/Sex: [birth year] (65 y.o. Male) Other Clinician: Primary Care Physician: Burna Sis, Christana Angelica Referring Physician: Blanchie Serve Physician/Extender: Suella Grove in Treatment: 0 Wound History Do you currently have one or more open woundso Yes How many open wounds do you currently haveo 4 Approximately how long have you had your woundso 3 month How have you been treating your wound(s) until nowo dry dressing Has your wound(s) ever healed and then re-openedo No Have you had any lab work done in the past montho No Have you tested positive for an antibiotic resistant organism (MRSA, VRE)o No Have you tested positive for osteomyelitis (bone infection)o No Have you had any tests for circulation on  your legso No Eyes Complaints and Symptoms: Positive for: Dry Eyes; Glasses / Contacts Cardiovascular Complaints and Symptoms: Positive for: LE edema Medical History: Positive  for: Arrhythmia; Congestive Heart Failure; Hypertension Genitourinary Complaints and Symptoms: Positive for: Kidney failure/ Dialysis Medical History: Positive for: End Stage Renal Disease - HD t/thur/sat Integumentary (Skin) Complaints and Symptoms: Positive for: Wounds; Breakdown; Swelling Medical History: Positive for: History of pressure wounds Lanni, Teofil M. ([id number]) Neurologic Complaints and Symptoms: Positive for: Numbness/parasthesias Medical History: Positive for: Dementia; Neuropathy Negative for: Paraplegia; Seizure Disorder Constitutional Symptoms (General Health) Complaints and Symptoms: No Complaints or Symptoms Ear/Nose/Mouth/Throat Complaints and Symptoms: No Complaints or Symptoms Medical History: Negative for: Chronic sinus problems/congestion; Middle ear problems Hematologic/Lymphatic Complaints and Symptoms: No Complaints or Symptoms Medical History: Positive for: Anemia Negative for: Hemophilia; Human Immunodeficiency Virus; Lymphedema; Sickle Cell Disease Respiratory Complaints and Symptoms: No Complaints or Symptoms Medical History: Negative for: Aspiration; Asthma; Chronic Obstructive Pulmonary Disease (COPD); Pneumothorax; Sleep Apnea; Tuberculosis Gastrointestinal Complaints and Symptoms: No Complaints or Symptoms Medical History: Positive for: Cirrhosis Negative for: Colitis; Crohnos; Hepatitis A; Hepatitis B; Hepatitis C Endocrine Complaints and Symptoms: No Complaints or Symptoms Tomaselli, Fulton M. ([id number]) Medical History: Positive for: Type II Diabetes Time with diabetes: 10 years Treated with: Insulin, Oral agents Blood sugar tested every day: Yes Tested : Blood sugar testing results: Breakfast: 145 Immunological Complaints and  Symptoms: No Complaints or Symptoms Medical History: Negative for: Lupus Erythematosus; Raynaudos; Scleroderma Musculoskeletal Complaints and Symptoms: No Complaints or Symptoms Medical History: Positive for: Osteoarthritis Negative for: Rheumatoid Arthritis Oncologic Complaints and Symptoms: No Complaints or Symptoms Medical History: Negative for: Received Chemotherapy; Received Radiation Psychiatric Complaints and Symptoms: No Complaints or Symptoms Medical History: Negative for: Anorexia/bulimia; Confinement Anxiety Family and Social History Former smoker; Marital Status - Divorced; Alcohol Use: Never; Drug Use: No History; Caffeine Use: Moderate; Financial Concerns: No; Food, Clothing or Shelter Needs: No; Support System Lacking: No; Transportation Concerns: No; Advanced Directives: Yes; Living Will: Yes; Medical Power of Attorney: Yes - Temike Drone Electronic Signature(s) JOZYAH, PINNEO ([id number]) Signed: 05/09/2016 3:42:51 PM By: Regan Lemming BSN, RN Signed: 05/09/2016 4:57:40 PM By: Judene Companion MD Entered By: Regan Lemming on 05/09/2016 [place] ([id number]) -------------------------------------------------------------------------------- SuperBill Details Sookram, Casson Date of Service: 05/09/2016 Patient Name: Tony Mages. Patient Account Number: [id number] Medical Record Afful, RN, BSN, [id number] Treating RN: Number: Velva Harman Date of Birth/Sex: May 16, 1951 (65 y.o. Male) Other Clinician: Primary Care Physician: Burna Sis, Mikahla Wisor Referring Physician: Blanchie Serve Physician/Extender: Suella Grove in Treatment: 0 Diagnosis Coding ICD-10 Codes Code Description L97.211 Non-pressure chronic ulcer of right calf limited to breakdown of skin L97.221 Non-pressure chronic ulcer of left calf limited to breakdown of skin L98.491 Non-pressure chronic ulcer of skin of other sites limited to breakdown of skin Facility Procedures CPT4 Code:  [id number] Description: [id number] - WOUND CARE VISIT-LEV 5 EST PT Modifier: Quantity: 1 Physician Procedures CPT4: Description Modifier Quantity Code [id number] WC PHYS LEVEL 3 o NEW PT 1 ICD-10 Description Diagnosis L97.211 Non-pressure chronic ulcer of right calf limited to breakdown of skin L97.221 Non-pressure chronic ulcer of left calf limited to breakdown of  skin L98.491 Non-pressure chronic ulcer of skin of other sites limited to breakdown of skin Electronic Signature(s) Signed: 05/09/2016 12:42:02 PM By: Regan Lemming BSN, RN Signed: 05/09/2016 4:57:40 PM By: Judene Companion MD Previous Signature: 05/09/2016 12:05:00 PM Version By: Judene Companion MD Entered By: Regan Lemming on 05/09/2016 12:42:02

## 2016-05-10 NOTE — Progress Notes (Signed)
Tony Freeman, Tony Freeman ([id number]) Visit Report for 05/09/2016 Abuse/Suicide Risk Screen Details Tony Freeman, Tony Freeman Date of Service: 05/09/2016 8:45 AM Patient Name: M. Patient Account Number: [id number] Medical Record Afful, RN, BSN, [id number] Treating RN: Number: Velva Harman Date of Birth/Sex: [birth year] (65 y.o. Male) Other Clinician: Primary Care Physician: Burna Sis, PETER Referring Physician: Blanchie Serve Physician/Extender: Suella Grove in Treatment: 0 Abuse/Suicide Risk Screen Items Answer ABUSE/SUICIDE RISK SCREEN: Has anyone close to you tried to hurt or harm you recentlyo No Do you feel uncomfortable with anyone in your familyo No Has anyone forced you do things that you didnot want to doo No Do you have any thoughts of harming yourselfo No Patient displays signs or symptoms of abuse and/or neglect. No Electronic Signature(s) Signed: 05/09/2016 3:42:51 PM By: Regan Lemming BSN, RN Entered By: Regan Lemming on 05/09/2016 09:02:31 Tony Freeman ([id number]) -------------------------------------------------------------------------------- Activities of Daily Living Details Tony Freeman, Tony Freeman Date of Service: 05/09/2016 8:45 AM Patient Name: M. Patient Account Number: [id number] Medical Record Tony Gouty, RN, BSN, [id number] Treating RN: Number: Velva Harman Date of Birth/Sex: 1951/10/02 (65 y.o. Male) Other Clinician: Primary Care Physician: Burna Sis, [place] Referring Physician: Blanchie Serve Physician/Extender: Suella Grove in Treatment: 0 Activities of Daily Living Items Answer Activities of Daily Living (Please select one for each item) Drive Automobile Not Able Take Medications Need Assistance Use Telephone Completely Able Care for Appearance Need Assistance Use Toilet Need Assistance Bath / Shower Need Assistance Dress Self Need Assistance Feed Self Need Assistance Walk Need Assistance Get In / Out Bed Need Assistance Housework Need  Assistance Prepare Meals Need Assistance Handle Money Need Assistance Shop for Self Need Assistance Electronic Signature(s) Signed: 05/09/2016 3:42:51 PM By: Regan Lemming BSN, RN Entered By: Regan Lemming on 05/09/2016 09:03:06 Tony Freeman ([id number]) -------------------------------------------------------------------------------- Education Assessment Details Tony Freeman, Tony Freeman Date of Service: 05/09/2016 8:45 AM Patient Name: Tony Mages. Patient Account Number: [id number] Medical Record Afful, RN, BSN, [id number] Treating RN: Number: Velva Harman Date of Birth/Sex: Mar 11, 1951 (65 y.o. Male) Other Clinician: Primary Care Physician: Burna Sis, PETER Referring Physician: Blanchie Serve Physician/Extender: Suella Grove in Treatment: 0 Primary Learner Assessed: Caregiver Dorina Hoyer Reason Patient is not Primary Learner: SOn Learning Preferences/Education Level/Primary Language Learning Preference: Explanation Highest Education Level: College or Above Preferred Language: English Cognitive Barrier Assessment/Beliefs Language Barrier: No Translator Needed: No Memory Deficit: No Emotional Barrier: No Physical Barrier Assessment Impaired Vision: Yes Glasses Knowledge/Comprehension Assessment Knowledge Level: High Comprehension Level: High Ability to understand written High instructions: Ability to understand verbal High instructions: Motivation Assessment Anxiety Level: Calm Cooperation: Cooperative Education Importance: Acknowledges Need Interest in Health Problems: Asks Questions Perception: Coherent Willingness to Engage in Self- High Management Activities: Readiness to Engage in Self- High Management Activities: Electronic Signature(s) Signed: 05/09/2016 3:42:51 PM By: Regan Lemming BSN, RN Tony Freeman, Tony Freeman ([id number]) Entered By: Regan Lemming on 05/09/2016 09:04:03 Tony Freeman, Tony Freeman  ([id number]) -------------------------------------------------------------------------------- Fall Risk Assessment Details Tony Freeman, Tony Freeman Date of Service: 05/09/2016 8:45 AM Patient Name: Tony Mages. Patient Account Number: [id number] Medical Record Afful, RN, BSN, [id number] Treating RN: Number: Velva Harman Date of Birth/Sex: September 26, 1951 (65 y.o. Male) Other Clinician: Primary Care Physician: Burna Sis, PETER Referring Physician: Blanchie Serve Physician/Extender: Suella Grove in Treatment: 0 Fall Risk Assessment Items Have you had 2 or more falls in the last 12 monthso 0 No Have you had any fall that resulted in injury in the last 12 monthso 0 No FALL RISK ASSESSMENT: History of falling - immediate or within 3 months 0 No Secondary diagnosis 0 No Ambulatory  aid None/bed rest/wheelchair/nurse 0 Yes Crutches/cane/walker 0 No Furniture 0 No IV Access/Saline Lock 0 No Gait/Training Normal/bed rest/immobile 0 Yes Weak 0 No Impaired 0 No Mental Status Oriented to own ability 0 Yes Electronic Signature(s) Signed: 05/09/2016 3:42:51 PM By: Regan Lemming BSN, RN Entered By: Regan Lemming on 05/09/2016 09:04:23 Tony Freeman ([id number]) -------------------------------------------------------------------------------- Foot Assessment Details Tony Freeman, Tony Freeman Date of Service: 05/09/2016 8:45 AM Patient Name: Tony Mages. Patient Account Number: [id number] Medical Record Afful, RN, BSN, [id number] Treating RN: Number: Velva Harman Date of Birth/Sex: 01/26/1951 (65 y.o. Male) Other Clinician: Primary Care Physician: Burna Sis, PETER Referring Physician: Blanchie Serve Physician/Extender: Suella Grove in Treatment: 0 Foot Assessment Items Site Locations + = Sensation present, - = Sensation absent, C = Callus, U = Ulcer R = Redness, W = Warmth, M = Maceration, PU = Pre-ulcerative lesion F = Fissure, S = Swelling, D = Dryness Assessment Right: Left: Other Deformity: No No Prior Foot  Ulcer: No No Prior Amputation: No No Charcot Joint: No No Ambulatory Status: Ambulatory Without Help Gait: Unsteady Electronic Signature(s) Signed: 05/09/2016 3:42:51 PM By: Regan Lemming BSN, RN Entered By: Regan Lemming on 05/09/2016 09:04:49 Tony Freeman ([id number]) -------------------------------------------------------------------------------- Nutrition Risk Assessment Details Tony Freeman, Tony Freeman Date of Service: 05/09/2016 8:45 AM Patient Name: Tony Mages. Patient Account Number: [id number] Medical Record Tony Gouty, RN, BSN, [id number] Treating RN: Number: Velva Harman Date of Birth/Sex: 05-09-1951 (65 y.o. Male) Other Clinician: Primary Care Physician: Rico Junker Treating Jerline Pain PETER Referring Physician: Blanchie Serve Physician/Extender: Suella Grove in Treatment: 0 Height (in): Weight (lbs): Body Mass Index (BMI): Nutrition Risk Assessment Items NUTRITION RISK SCREEN: I have an illness or condition that made me change the kind and/or 0 No amount of food I eat I eat fewer than two meals per day 0 No I eat few fruits and vegetables, or milk products 0 No I have three or more drinks of beer, liquor or wine almost every day 0 No I have tooth or mouth problems that make it hard for me to eat 0 No I don't always have enough money to buy the food I need 0 No I eat alone most of the time 0 No I take three or more different prescribed or over-the-counter drugs a 0 No day Without wanting to, I have lost or gained 10 pounds in the last six 0 No months I am not always physically able to shop, cook and/or feed myself 0 No Nutrition Protocols Good Risk Protocol 0 No interventions needed Moderate Risk Protocol Electronic Signature(s) Signed: 05/09/2016 3:42:51 PM By: Regan Lemming BSN, RN Entered By: Regan Lemming on 05/09/2016 09:04:37

## 2016-05-14 ENCOUNTER — Encounter: Payer: Medicare HMO | Admitting: *Deleted

## 2016-05-14 ENCOUNTER — Telehealth: Payer: Self-pay | Admitting: Cardiology

## 2016-05-14 NOTE — Telephone Encounter (Signed)
Confirmed remote transmission w/ pt son and he stated that pt was not doing well and that they are discussing turning device off.

## 2016-05-15 ENCOUNTER — Encounter: Payer: Self-pay | Admitting: Adult Health

## 2016-05-15 ENCOUNTER — Telehealth: Payer: Self-pay | Admitting: Cardiology

## 2016-05-15 ENCOUNTER — Non-Acute Institutional Stay (SKILLED_NURSING_FACILITY): Payer: Medicare HMO | Admitting: Adult Health

## 2016-05-15 DIAGNOSIS — I480 Paroxysmal atrial fibrillation: Secondary | ICD-10-CM | POA: Diagnosis not present

## 2016-05-15 DIAGNOSIS — Z992 Dependence on renal dialysis: Secondary | ICD-10-CM

## 2016-05-15 DIAGNOSIS — M792 Neuralgia and neuritis, unspecified: Secondary | ICD-10-CM

## 2016-05-15 DIAGNOSIS — N186 End stage renal disease: Secondary | ICD-10-CM | POA: Diagnosis not present

## 2016-05-15 DIAGNOSIS — I2510 Atherosclerotic heart disease of native coronary artery without angina pectoris: Secondary | ICD-10-CM | POA: Diagnosis not present

## 2016-05-15 DIAGNOSIS — R188 Other ascites: Secondary | ICD-10-CM

## 2016-05-15 DIAGNOSIS — E785 Hyperlipidemia, unspecified: Secondary | ICD-10-CM | POA: Diagnosis not present

## 2016-05-15 DIAGNOSIS — E1122 Type 2 diabetes mellitus with diabetic chronic kidney disease: Secondary | ICD-10-CM | POA: Diagnosis not present

## 2016-05-15 DIAGNOSIS — J90 Pleural effusion, not elsewhere classified: Secondary | ICD-10-CM | POA: Diagnosis not present

## 2016-05-15 DIAGNOSIS — J9601 Acute respiratory failure with hypoxia: Secondary | ICD-10-CM | POA: Diagnosis not present

## 2016-05-15 DIAGNOSIS — I5043 Acute on chronic combined systolic (congestive) and diastolic (congestive) heart failure: Secondary | ICD-10-CM | POA: Diagnosis not present

## 2016-05-15 DIAGNOSIS — Z794 Long term (current) use of insulin: Secondary | ICD-10-CM

## 2016-05-15 DIAGNOSIS — E46 Unspecified protein-calorie malnutrition: Secondary | ICD-10-CM | POA: Diagnosis not present

## 2016-05-15 NOTE — Telephone Encounter (Signed)
LMOM that Mr. Wies has a [employer] ICD and gave # to request rep. A Palliative Care provider could write an order for ICD deactivation. Gave # to device clinic for call back if needed.

## 2016-05-15 NOTE — Telephone Encounter (Signed)
New message     The home care nurse needs the defib. deactivated for the pt the pt is coming off dialysis

## 2016-05-15 NOTE — Progress Notes (Signed)
Patient ID: Tony Freeman, male   DOB: 1951/07/06, 65 y.o.   MRN: [id number]    DATE:  05/15/2016   MRN:  [id number]  BIRTHDAY: 1951/07/14  Facility:  Nursing Home Location:  [place] Room Number: [id number]  LEVEL OF CARE:  SNF [phone])  Contact Information    Name Relation Home Work Mobile   [place] Son [phone]  [phone]   [place],[name] Daughter  [phone]   [place] Daughter [phone]  [phone]       Code Status History    Date Active Date Inactive Code Status Order ID Comments User Context   04/24/2016  6:40 AM 05/02/2016 11:52 PM Full Code [id number]  Rise Patience, MD Inpatient   01/10/2016 11:16 PM 01/19/2016  9:40 PM Full Code [id number]  Ivor Costa, MD ED   01/31/2015 11:57 AM 02/01/2015  3:45 AM Full Code [id number]  Markus Daft, MD HOV   01/24/2015 12:57 PM 01/26/2015  5:23 PM Full Code [id number]  Verlee Monte, MD ED   07/25/2014  9:11 AM 07/31/2014  6:19 PM Full Code [id number]  Larey Dresser, MD Inpatient   07/16/2014  9:38 PM 07/25/2014  9:11 AM Full Code [id number]  Cresenciano Genre, MD Inpatient   04/02/2014  3:04 AM 04/08/2014  5:58 PM Full Code [id number]  Elmarie Shiley, MD Inpatient       Chief Complaint  Patient presents with  . Discharge Note    HISTORY OF PRESENT ILLNESS:  This is a 65 year old male who is for discharge to [place]. Patient refused to have dialysis today. Family and patient have decided to have hospice @ [facility]. He is in a lot of pain requiring high dose of pain medications. His defibrillator was turned off today by [employer]. Daughter is at bedside.  He has been admitted to [facility] on 05/02/16 from [facility]. He has PMH of chronic atrial fibrillation (was previously on anticoagulant with Coumadin), ischemic cardiomyopathy S/P ICD placement, CAD S/P stenting, diabetes mellitus type 2, ESRD on hemodialysis on Tuesdays, Thursday and Saturday  and chronic combined systolic and diastolic heart failure. He was treated in the ER for hypoxia with BiPAP. Chest x-ray showed airspace opacity and worsening right-sided pleural effusion. He was treated for hyperkalemia with calcium gluconate, sodium bicarbonate and insulin D50. He was treated for presumed pneumonia and was started on empiric antibiotics. He was transitioned quickly off BiPAP after first hemodialysis session with improvement with volume overload. 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 Nephrology recommended evaluation and follow-up in the wound care center for HBO treatments.  Patient was admitted to this facility for short-term rehabilitation after the patient's recent hospitalization.  Patient has completed SNF rehabilitation and therapy has cleared the patient for discharge.  PAST MEDICAL HISTORY:  Past Medical History  Diagnosis Date  . Systolic and diastolic CHF, chronic ([place])     a. Echo at [facility] 11/17/2013 Ef <25%, severe hypokinesis of LV, moderately dilated LA, grade IV/IV diastolic dysfunction with irreversible restrictive pattern of mitral inflow, severe pulm HTN (RVSP >28mmHg), 1-2+ MR  b. Echo at [facility] 04/02/14 EF 25-30%, moderate concentric LVH, diffuse hypokinesis, moderately dilated LA, no significant MR observed  . CAD (coronary artery disease)     a. PCI LAD/RCA in 2007  b. PCI LAD/RCA in 2009 at [facility]  c. PCI LAD 08/2013, CTO of RCA and nonobstructive disease in the LCx.  Marland Kitchen Hyperlipidemia   .  Dementia   . Ischemic cardiomyopathy     a. s/p [employer] ICD 06/2014.  Marland Kitchen Retinopathy     bilateral  . HTN (hypertension)   . Abnormal renal ultrasound     a. 03/2014: Abnormal renal ultrasound with left renal lesion and bladder wall thickening and microscopic hematuria. Multiple cysts on prior MRI. Given inability to use contrast, f/u imaging limited, repeat US 3 months with outpatient uro f/u.  Marland Kitchen Paroxysmal atrial  fibrillation (HCC)   . Paroxysmal atrial flutter ([place])   . Anemia   . Myocardial infarction [facility]) July 2016    Heart attack  . Peripheral vascular disease ([place])   . Stroke [facility])     right arm weakness  . Asthma   . CKD (chronic kidney disease), stage IV ([place])     on dialysis T/TH/Sa  . Diabetes mellitus with nephropathy ([place])     type 2  . H/O cardiac arrest 04/2015  . Constipation   . AICD (automatic cardioverter/defibrillator) present   . ESRD (end stage renal disease) on dialysis ([place])   . Pneumonia     when he was younger, March 2017     CURRENT MEDICATIONS: Reviewed  Patient's Medications  New Prescriptions   No medications on file  Previous Medications   ACETAMINOPHEN (TYLENOL) 325 MG TABLET    Take 650 mg by mouth every 8 (eight) hours as needed for headache (pain).    ACETAMINOPHEN (TYLENOL) 500 MG TABLET    Take 500 mg by mouth every 6 (six) hours.   ALBUTEROL (PROVENTIL HFA;VENTOLIN HFA) 108 (90 BASE) MCG/ACT INHALER    Inhale 2 puffs into the lungs every 6 (six) hours as needed for wheezing or shortness of breath.   ALBUTEROL (PROVENTIL) (2.5 MG/3ML) 0.083% NEBULIZER SOLUTION    Take 2.5 mg by nebulization every 6 (six) hours as needed for wheezing or shortness of breath.   AMIODARONE (PACERONE) 200 MG TABLET    Take 0.5 tablets (100 mg total) by mouth daily.   APIXABAN (ELIQUIS) 5 MG TABS TABLET    Take 1 tablet (5 mg total) by mouth 2 (two) times daily.   BISACODYL (DULCOLAX) 10 MG SUPPOSITORY    Place 10 mg rectally daily as needed for moderate constipation.   BUDESONIDE-FORMOTEROL (SYMBICORT) 160-4.5 MCG/ACT INHALER    Inhale 2 puffs into the lungs 2 (two) times daily.   CARVEDILOL (COREG) 3.125 MG TABLET    Take 1 tablet (3.125 mg total) by mouth 2 (two) times daily with a meal.   CLOBETASOL OINTMENT (TEMOVATE) 0.05 %    Apply 1 application topically 2 (two) times daily.   CLOPIDOGREL (PLAVIX) 75 MG TABLET    TAKE ONE TABLET BY MOUTH ONCE DAILY   DIAZEPAM (VALIUM)  2 MG TABLET    Take 2 mg by mouth every 6 (six) hours as needed for muscle spasms.   FENTANYL (DURAGESIC - DOSED MCG/HR) 12 MCG/HR    Place 1 patch (12.5 mcg total) onto the skin every 3 (three) days.   GABAPENTIN (NEURONTIN) 300 MG CAPSULE    Take 300 mg by mouth at bedtime.   INSULIN ASPART (NOVOLOG) 100 UNIT/ML INJECTION    Inject 3 Units into the skin 3 (three) times daily with meals.   INSULIN ASPART (NOVOLOG) 100 UNIT/ML INJECTION    Inject 0-9 Units into the skin 3 (three) times daily with meals. Sliding scale:  0-69 hypoglycemic protocol and notify MD/NP 70-120 = 0 units, 121-150 = 1 unit, 151-200 = 3 units,  201-250 = 3 units; 251-300 = 5 units, 301-350 = 7 units; 351-400 = 9 units; >400 notify MD/NP   MUPIROCIN OINTMENT (BACTROBAN) 2 %    Place 1 application into the nose 2 (two) times daily.   NITROGLYCERIN (NITROSTAT) 0.4 MG SL TABLET    Place 1 tablet (0.4 mg total) under the tongue every 5 (five) minutes as needed for chest pain.   OXYCODONE (OXY IR/ROXICODONE) 5 MG IMMEDIATE RELEASE TABLET    Take 5 mg by mouth every 4 (four) hours as needed for severe pain. Give 1 now, 7/20   POLYETHYLENE GLYCOL (MIRALAX / GLYCOLAX) PACKET    Take 17 g by mouth daily.   SENNOSIDES-DOCUSATE SODIUM (SENOKOT-S) 8.6-50 MG TABLET    Take 2 tablets by mouth 2 (two) times daily.   SILVER SULFADIAZINE (SILVADENE) 1 % CREAM    Apply 1 application topically 2 (two) times daily.   UNABLE TO FIND    Med Name: Med Pass 120 mL BID between meals for supplement  Modified Medications   No medications on file  Discontinued Medications   ATORVASTATIN (LIPITOR) 40 MG TABLET    TAKE ONE TABLET BY MOUTH ONCE DAILY   B COMPLEX-VITAMIN C-FOLIC ACID (NEPHRO-VITE) 0.8 MG TABS TABLET    Take 1 tablet by mouth at bedtime.   DOCUSATE SODIUM (COLACE) 100 MG CAPSULE    Take 100 mg by mouth daily as needed (constipation).    MULTIPLE VITAMIN (MULTIVITAMIN WITH MINERALS) TABS TABLET    Take 1 tablet by mouth daily.     No Known  Allergies   REVIEW OF SYSTEMS:  GENERAL: no change in appetite, no fatigue, no weight changes, no fever, chills or weakness EYES: Denies change in vision, dry eyes, eye pain, itching or discharge EARS: Denies change in hearing, ringing in ears, or earache NOSE: Denies nasal congestion or epistaxis MOUTH and THROAT: Denies oral discomfort, gingival pain or bleeding, pain from teeth or hoarseness   RESPIRATORY: no cough, SOB, DOE, wheezing, hemoptysis CARDIAC: no chest pain, edema or palpitations GI: no abdominal pain, diarrhea, constipation, heart burn, nausea or vomiting GU: Denies dysuria, frequency, hematuria, incontinence, or discharge PSYCHIATRIC: Denies feeling of depression or anxiety. No report of hallucinations, insomnia, paranoia, or agitation   PHYSICAL EXAMINATION  GENERAL APPEARANCE: distressed with pain. Normal body habitus SKIN:  BLE  and penis wounds HEAD: Normal in size and contour. No evidence of trauma EYES: Lids open and close normally. No blepharitis, entropion or ectropion. PERRL. Conjunctivae are clear and sclerae are white. Lenses are without opacity EARS: Pinnae are normal. Patient hears normal voice tunes of the examiner MOUTH and THROAT: Lips are without lesions. Oral mucosa is moist and without lesions. Tongue is normal in shape, size, and color and without lesions NECK: supple, trachea midline, no neck masses, no thyroid tenderness, no thyromegaly LYMPHATICS: no LAN in the neck, no supraclavicular LAN RESPIRATORY: breathing is even & unlabored, BS CTAB CARDIAC: RRR, no murmur,no extra heart sounds, no edema, left chest defibrillator, right chest vascath, GI: abdomen soft, normal BS, no masses, no tenderness, no hepatomegaly, no splenomegaly EXTREMITIES:  Able to move 4 extremities PSYCHIATRIC: Alert and oriented X 3. Affect and behavior are appropriate  LABS/RADIOLOGY: Labs reviewed: Basic Metabolic Panel:  Recent Labs  04/25/16 0216  04/28/16 0347  04/29/16 0511 04/30/16 0619 05/09/16  NA 134*  < > 130* 132* 131* 135*  K 4.9  < > 5.7* 5.1 4.8 5.0  CL 94*  < > 85* 96*  94*  --   CO2 24  < > 20* 22 25  --   GLUCOSE 130*  < > 159* 132* 170*  --   BUN 41*  < > 80* 45* 37* 65*  CREATININE 4.68*  < > 7.40* 5.22* 4.35* 6.8*  CALCIUM 9.9  < > 10.2 8.7* 9.0  --   MG 2.1  --   --   --   --   --   PHOS 5.3*  < > 7.7* 6.0* 5.4*  --   < > = values in this interval not displayed. Liver Function Tests:  Recent Labs  04/24/16 0710  04/25/16 1000  04/27/16 0623 04/28/16 0347 04/29/16 0511 04/30/16 0619  AST 25  --  26  --  23  --   --   --   ALT 20  --  19  --  17  --   --   --   ALKPHOS 145*  --  137*  --  151*  --   --   --   BILITOT 1.8*  --  1.7*  --  1.3*  --   --   --   PROT 7.2  --  7.6  --  6.6  --   --   --   ALBUMIN 3.0*  < > 2.9*  < > 2.5*  2.5* 2.5* 2.5* 2.3*  < > = values in this interval not displayed.  CBC:  Recent Labs  04/26/16 0527 04/27/16 0623 04/28/16 0347 04/29/16 0511 04/30/16 0609 05/09/16  WBC 13.5* 12.9* 13.9* 12.3* 13.8* 11.1  NEUTROABS 11.3* 10.6*  --   --   --  9  HGB 10.1* 10.2* 10.0* 9.8* 10.4* 7.3*  HCT 31.6* 31.6* 30.9* 30.6* 33.1* 24*  MCV 100.0 99.1 97.8 98.4 103.1*  --   PLT 146* 154 146* 156 132* 241   Lipid Panel:  Recent Labs  11/16/15 0930  HDL 34*   Cardiac Enzymes:  Recent Labs  04/24/16 0710 04/24/16 1353 04/24/16 1822  TROPONINI 0.34* 0.39* 0.40*   CBG:  Recent Labs  05/02/16 1146 05/02/16 1703 05/02/16 2017  GLUCAP 193* 180* 155*      Dg Chest 2 View  04/27/2016  CLINICAL DATA:  Pleural effusion; Pt reports cough "for a while"; he denies CP or SOB; h/o ICD insertion, HTN, and DM; non-smoker EXAM: CHEST - 2 VIEW COMPARISON:  04/25/2016 FINDINGS: Moderate right and smaller left pleural effusions. Adjacent consolidation/ atelectasis in the lower lobes. Some improvement in right mid lung aeration. Improvement in interstitial edema. Stable tunneled right IJ  hemodialysis catheter, and left subclavian AICD. Cardiomegaly with extensive coronary calcifications. Atheromatous aorta. Visualized bones unremarkable. IMPRESSION: 1. Interval improvement in interstitial edema and right midlung infiltrate 2. Persistent pleural effusions and bibasilar atelectasis/consolidation. 3.  Aortic Atherosclerosis (ICD10-170.0) Electronically Signed   By: Lucrezia Europe M.D.   On: 04/27/2016 10:41   Dg Chest 2 View  04/24/2016  CLINICAL DATA:  Dyspnea.  Lower extremity pain and swelling. EXAM: CHEST  2 VIEW COMPARISON:  04/17/2016 FINDINGS: Dual-lumen right jugular central line extending to the cavoatrial junction. Intact transvenous cardiac leads. Unchanged cardiomegaly. Slightly enlarged right pleural effusion. Unchanged small left effusion. Mild airspace opacity in the central right lung and in the right lung base, worsened. This may represent asymmetric alveolar edema. Infectious infiltrate not excluded. IMPRESSION: Mildly worsened central and basilar right lung opacities. This could represent asymmetric alveolar edema or infectious infiltrate. Right pleural effusion is slightly enlarged from  04/17/2016. Electronically Signed   By: Andreas Newport M.D.   On: 04/24/2016 01:53   Dg Chest 2 View  04/17/2016  CLINICAL DATA:  Asthma.  History of MI, CHF. EXAM: CHEST  2 VIEW COMPARISON:  03/14/2016. FINDINGS: Dual-lumen right IJ catheter in stable position. Cardiac pacer stable position. Stable cardiomegaly. Coronary artery calcification. Low lung volumes with bibasilar atelectasis and/or mild infiltrates. Small bilateral pleural effusions. IMPRESSION: 1. Dual-lumen right IJ catheter in stable position . 2. Cardiac pacer in stable position. Coronary artery disease. Cardiomegaly with bibasilar atelectasis and/or infiltrates and bilateral pleural effusions unchanged. Electronically Signed   By: Marcello Moores  Register   On: 04/17/2016 09:54   Ct Chest Wo Contrast  04/24/2016  CLINICAL DATA:   Further evaluate pleural effusion. Cough. History of MI in 2016. History of hypertension. EXAM: CT CHEST WITHOUT CONTRAST TECHNIQUE: Multidetector CT imaging of the chest was performed following the standard protocol without IV contrast. COMPARISON:  04/24/2016 FINDINGS: Cardiovascular: Extensive coronary artery calcifications are present. Heart is mildly enlarged. No pericardial effusion. Left-sided AICD leads extend into the right atrium and right ventricle. A a right-sided dialysis catheter tip extends to the level of the SVC right atrial junction. Mediastinum/Nodes: The visualized portion of the thyroid gland has a normal appearance. Small mediastinal lymph nodes are present. Largest is identified in the region the precarinal station, measuring approximate 1.0 cm. Lungs/Pleura: There are bilateral pleural effusions, right greater than left. Bibasilar atelectasis is present. Parenchymal calcifications are identified within the lung bases, right greater than left likely representing calcified granulomata. No suspicious pulmonary nodules are identified. There is smooth septal thickening in the lungs, consistent with mild pulmonary edema. Upper Abdomen: Nodular contour of the liver raising the question of cirrhosis. Calcified granulomata are identified within the liver. There is dense atherosclerotic calcification of the abdominal aorta and its branches. Ascites. Calcified gallstone or gallstones. Musculoskeletal: Mild mid thoracic spondylosis. No suspicious lytic or blastic lesions are identified. IMPRESSION: 1. Small to moderate right pleural effusion. 2. Small left pleural effusion. 3. Bibasilar atelectasis. 4. Coronary artery disease and mild cardiomegaly. 5. Catheters and pacemaker as described. 6. Abdominal ascites and probable cirrhosis. 7. Cholelithiasis. Electronically Signed   By: Nolon Nations M.D.   On: 04/24/2016 15:17   US Abdomen Limited  04/24/2016  CLINICAL DATA:  Abdominal distention for 1  year. Evaluate for ascites. EXAM: LIMITED ABDOMEN ULTRASOUND FOR ASCITES TECHNIQUE: Limited ultrasound survey for ascites was performed in all four abdominal quadrants. COMPARISON:  11/21/2015 CT FINDINGS: Ultrasound is performed of the 4 quadrants, demonstrating moderate ascites throughout the abdomen and pelvis. IMPRESSION: Moderate ascites. Electronically Signed   By: Nolon Nations M.D.   On: 04/24/2016 15:29   US Paracentesis  04/26/2016  INDICATION: 65 year old with a history of congestive heart failure as well as end-stage renal disease on hemodialysis. He has developed abdominal ascites and a request is made for diagnostic and therapeutic paracentesis. EXAM: ULTRASOUND GUIDED DIAGNOSTIC AND THERAPEUTIC PARACENTESIS MEDICATIONS: 1% lidocaine COMPLICATIONS: None immediate. PROCEDURE: Informed written consent was obtained from the patient after a discussion of the risks, benefits and alternatives to treatment. A timeout was performed prior to the initiation of the procedure. Initial ultrasound scanning demonstrates a large amount of ascites within the right upper abdominal quadrant. The right upper abdomen was prepped and draped in the usual sterile fashion. 1% lidocaine was used for local anesthesia. Following this, a 19 gauge, 7-cm, Yueh catheter was introduced. An ultrasound image was saved for documentation purposes. The paracentesis  was performed. The catheter was removed and a dressing was applied. The patient tolerated the procedure well without immediate post procedural complication. FINDINGS: A total of approximately 6 L of tan chylous colored fluid was removed. Samples were sent to the laboratory as requested by the clinical team. IMPRESSION: Successful ultrasound-guided paracentesis yielding 6 liters of peritoneal fluid. We stopped at 6 L as this is a first time paracentesis maximum. Read by: Saverio Danker, PA-C Electronically Signed   By: Lucrezia Europe M.D.   On: 04/25/2016 16:46   Dg Chest Port  1 View  04/25/2016  CLINICAL DATA:  Cough, shortness of Breath EXAM: PORTABLE CHEST 1 VIEW COMPARISON:  04/24/2016 FINDINGS: There is moderate partially loculated right pleural effusion. Atelectasis or infiltrate right lower lobe. No pulmonary edema. Dual lead cardiac pacemaker with leads in right atrium and right ventricle. Right IJ dialysis catheter with tip in distal SVC. Small left pleural effusion with left basilar atelectasis. IMPRESSION: Moderate partial loculated right pleural effusion, atelectasis or infiltrate in right lower lobe. No pulmonary edema. Small left pleural effusion with left basilar atelectasis. Electronically Signed   By: Lahoma Crocker M.D.   On: 04/25/2016 14:41   Dg Chest Port 1 View  04/24/2016  CLINICAL DATA:  Dyspnea and weakness EXAM: PORTABLE CHEST 1 VIEW COMPARISON:  04/24/2016 FINDINGS: Intact transvenous cardiac leads. Dual-lumen right jugular central line extends to the cavoatrial junction. Moderate cardiomegaly persists, unchanged. Moderate right pleural effusion persists, unchanged or mildly larger. Central and basilar right lung opacities have mildly worsened. IMPRESSION: Mild worsening of right central airspace opacity and probable continued enlargement of right pleural effusion. This likely represents congestive heart failure with asymmetric alveolar edema. Electronically Signed   By: Andreas Newport M.D.   On: 04/24/2016 03:28   Dg Tibia/fibula Left Port  04/24/2016  CLINICAL DATA:  Left lower extremity pain and swelling for 2 days. No trauma. EXAM: PORTABLE LEFT TIBIA AND FIBULA - 2 VIEW COMPARISON:  None. FINDINGS: Negative for fracture dislocation. There is no bone lesion or bony destruction. There are extensive vascular calcifications. No soft tissue gas or foreign body IMPRESSION: Negative Electronically Signed   By: Andreas Newport M.D.   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 - avoiding all calcium with vitamin D products; goal calcium for patient 7.0 - 8.0  ; continueTylenol 325 mg 2 tabs = 650 mg by mouth every 8 hours when necessary, Oxycodone 5 mg 1 tab PO Q 4 hours PRN for pain, Valium 2 mg PO Q 6 hours PRN for muscle spasm ; for comfort/hospice care now Lab Results  Component Value Date   CALCIUM 9.0 04/30/2016   PHOS 5.4* 04/30/2016    Acute on chronic combined systolic and diastolic  CHF - 2-D echo from 11/29/15 with EF 15-20%; continue Coreg 3.125 mg 1 tab by mouth twice a day  Paroxysmal atrial fibrillation - chads vasc score 3; continue Eliquis 5 mg 1 tab by mouth twice a day, Coreg 3.125 mg by mouth twice a day and amiodarone 200 mg 1/2 tab = 100 mg  by mouth daily  Diabetes mellitus, type II with diabetic nephropathy - continue NovoLog 3 units subcutaneous 3 times a day and Novolog sliding scale 3 times a day  Lab Results  Component Value Date   HGBA1C 5.9* 04/27/2016    Hyperlipidemia - Atorvastatin was discontinued per hospice  Lab Results  Component Value Date   CHOL 110 11/16/2015   HDL 34* 11/16/2015   LDLCALC 64 11/16/2015   TRIG 58 11/16/2015   CHOLHDL 3.2 11/16/2015    CAD - likely secondary to CHF; continue Eliquis, Plavix and NTG when necessary, atorvastatin was discontinued  Right pleural effusion - per CCM it is likely secondary to patient's end-stage renal disease and chronic systolic heart failure (EF 15-20%) and will continue HD for volume overload  Ascites - S/P paracentesis with 6 L of fluid removed on  04/26/16  Protein-calorie malnutrition, severe - Renavite was discontinued per hospice  Neuropathy - gabapentin 300 mg 1 capsule by mouth daily at bedtime      I have filled out patient's discharge paperwork.    Total discharge time: Greater than 30 minutes  Discharge time involved coordination of the discharge process with social worker, nursing staff, [facility] and therapy department.      Durenda Age, NP [employer] [phone]

## 2016-05-16 ENCOUNTER — Ambulatory Visit: Payer: Medicare HMO | Admitting: General Surgery

## 2016-05-23 ENCOUNTER — Encounter: Payer: Self-pay | Admitting: Vascular Surgery

## 2016-05-27 DEATH — deceased

## 2016-05-28 ENCOUNTER — Encounter (HOSPITAL_COMMUNITY): Payer: Medicare HMO

## 2016-05-28 ENCOUNTER — Ambulatory Visit: Payer: Medicare HMO | Admitting: Vascular Surgery

## 2016-06-09 ENCOUNTER — Inpatient Hospital Stay (HOSPITAL_COMMUNITY): Admission: RE | Admit: 2016-06-09 | Payer: Medicare HMO | Source: Ambulatory Visit

## 2016-12-19 ENCOUNTER — Encounter: Payer: Self-pay | Admitting: Cardiology

## 2017-10-30 IMAGING — DX DG CHEST 2V
2 series · 2 of 2 positions shown · non-contrast
Comparison: January 23, 25

CLINICAL DATA: Shortness of breath

EXAM:
CHEST  2 VIEW

[chest pa]
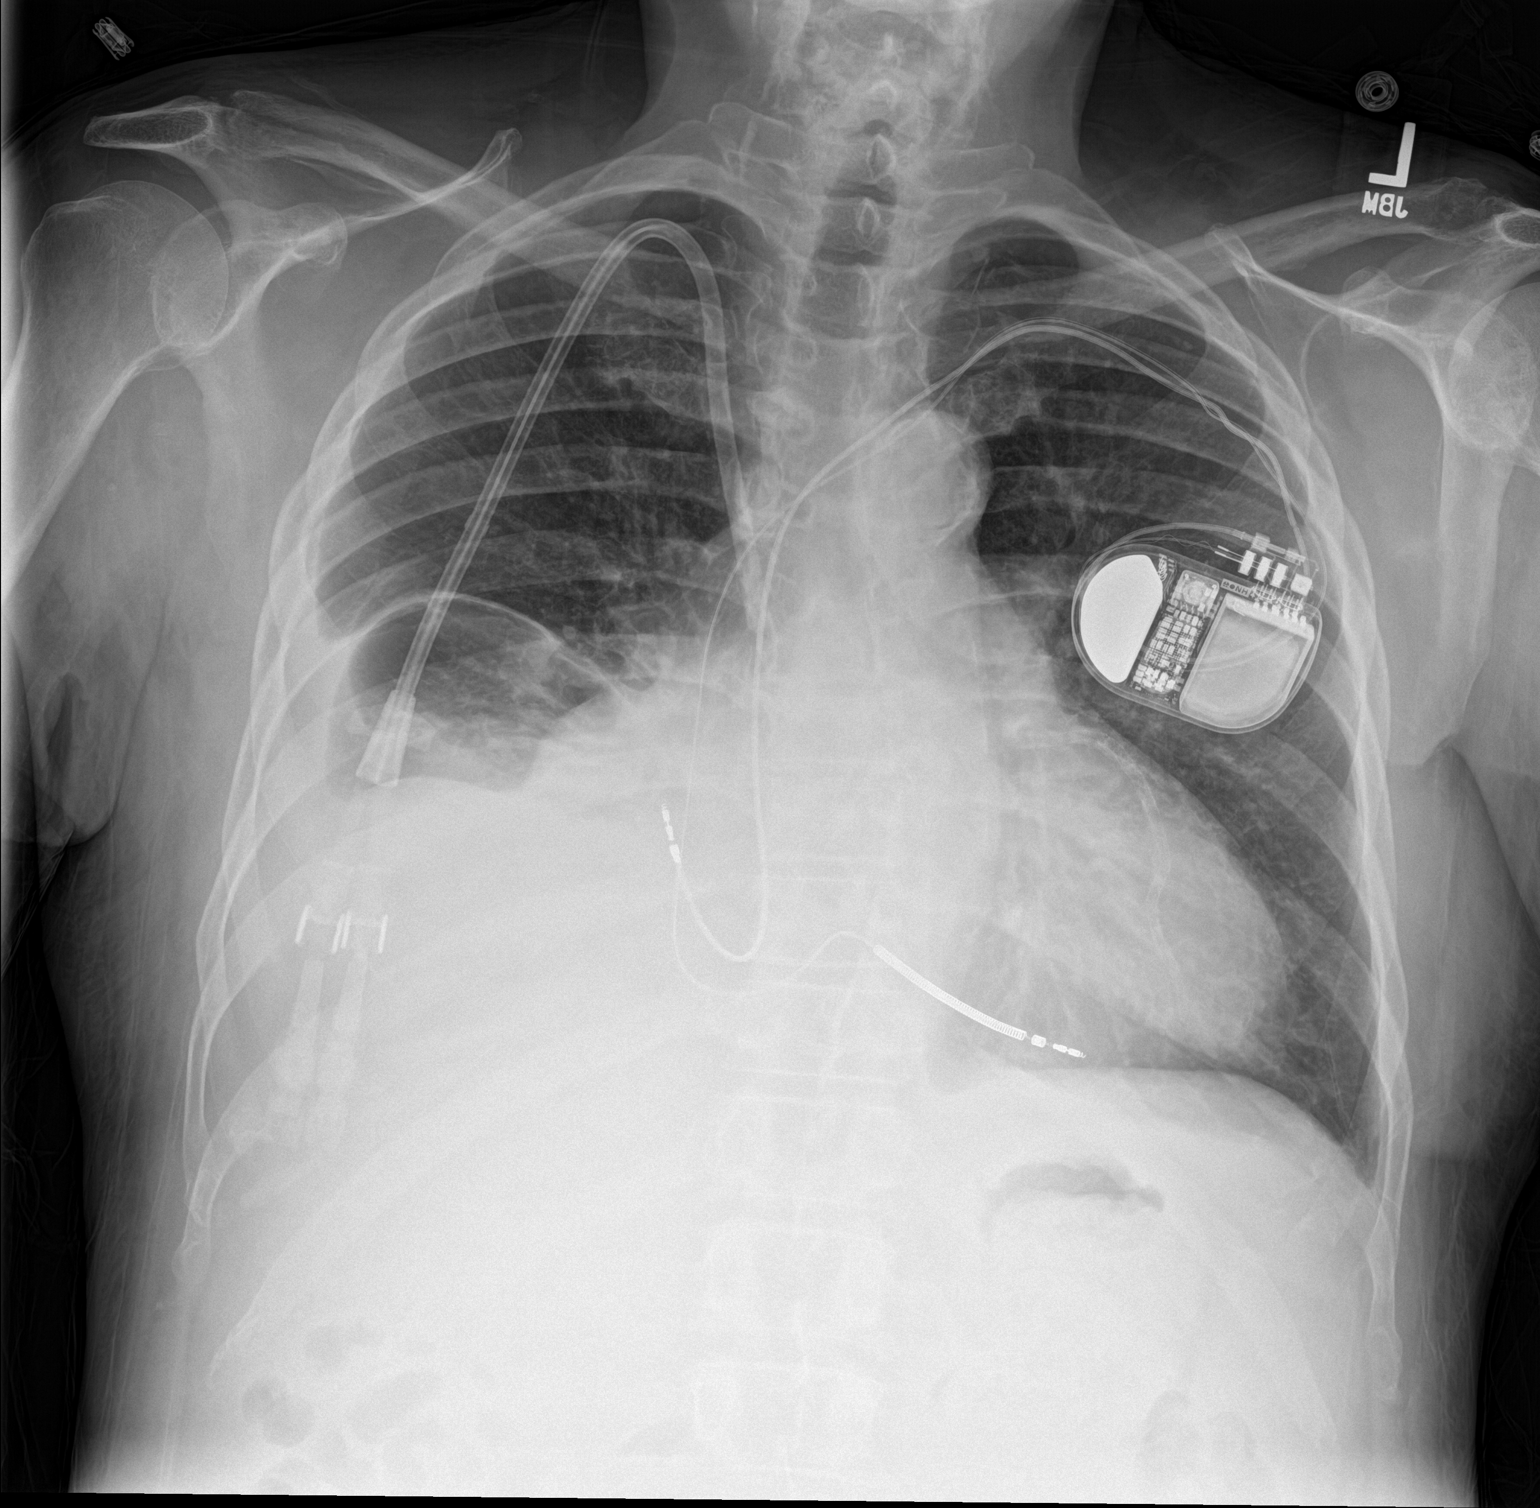

[chest lat]
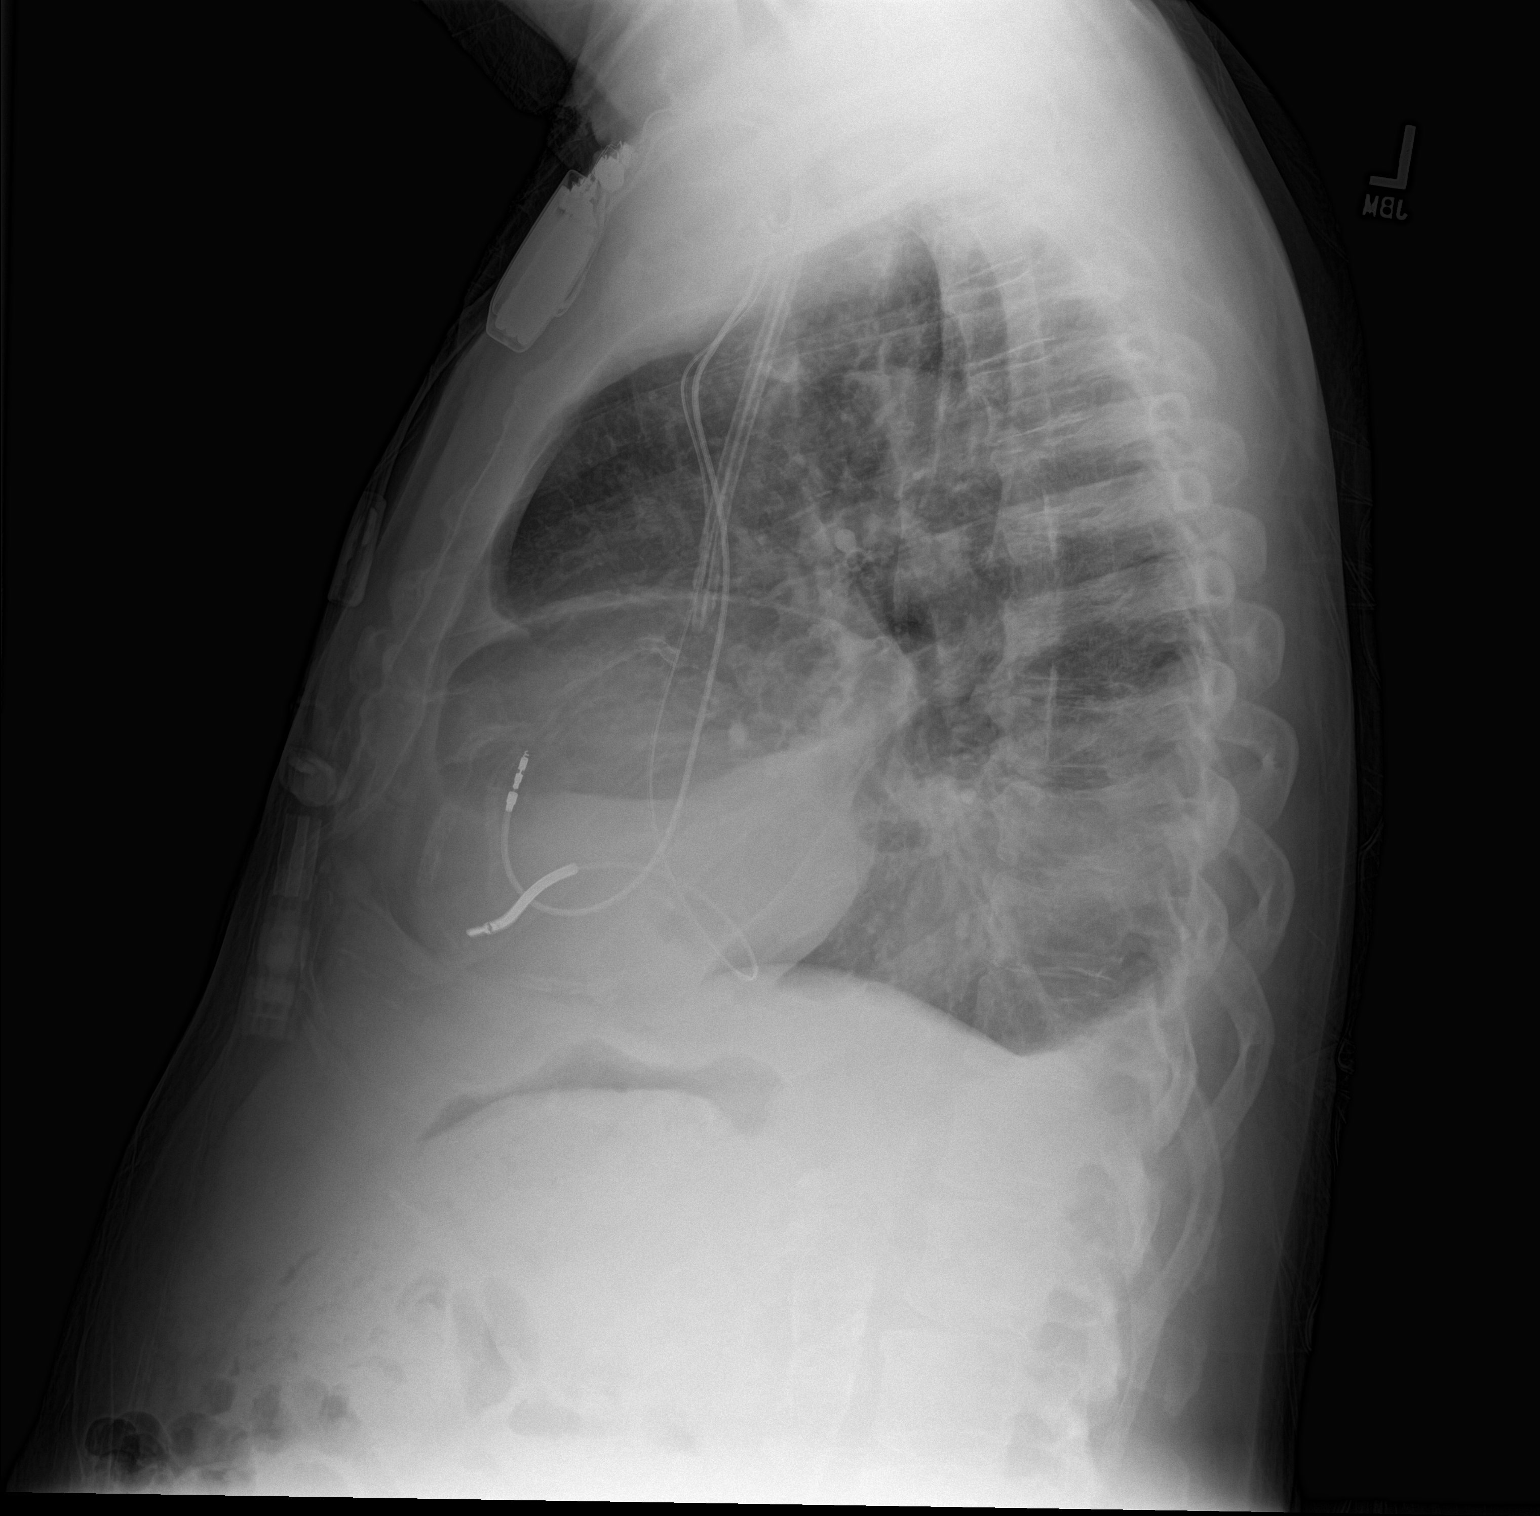

[2 of 2 positions shown; findings below may reference images not displayed]

FINDINGS: The heart size and mediastinal contours are stable. Heart size is
enlarged. Cardiac pacemaker is unchanged. Dual lumen right central
venous line are noted with distal tips in the superior vena cava.
There is a moderate right pleural effusion with consolidation of
right lung base. There is a small left pleural effusion. The
visualized skeletal structures are stable.
IMPRESSION: Moderate right pleural effusion with consolidation right lung base,
underlying pneumonia is not excluded.

Small left pleural effusion.

## 2017-11-05 IMAGING — CR DG CHEST 1V PORT
1 series · 1 of 1 positions shown · non-contrast
Comparison: 01/10/2016

CLINICAL DATA: Hypoxemia.  Shortness of breath for 1 day.

EXAM:
PORTABLE CHEST 1 VIEW

[AP]
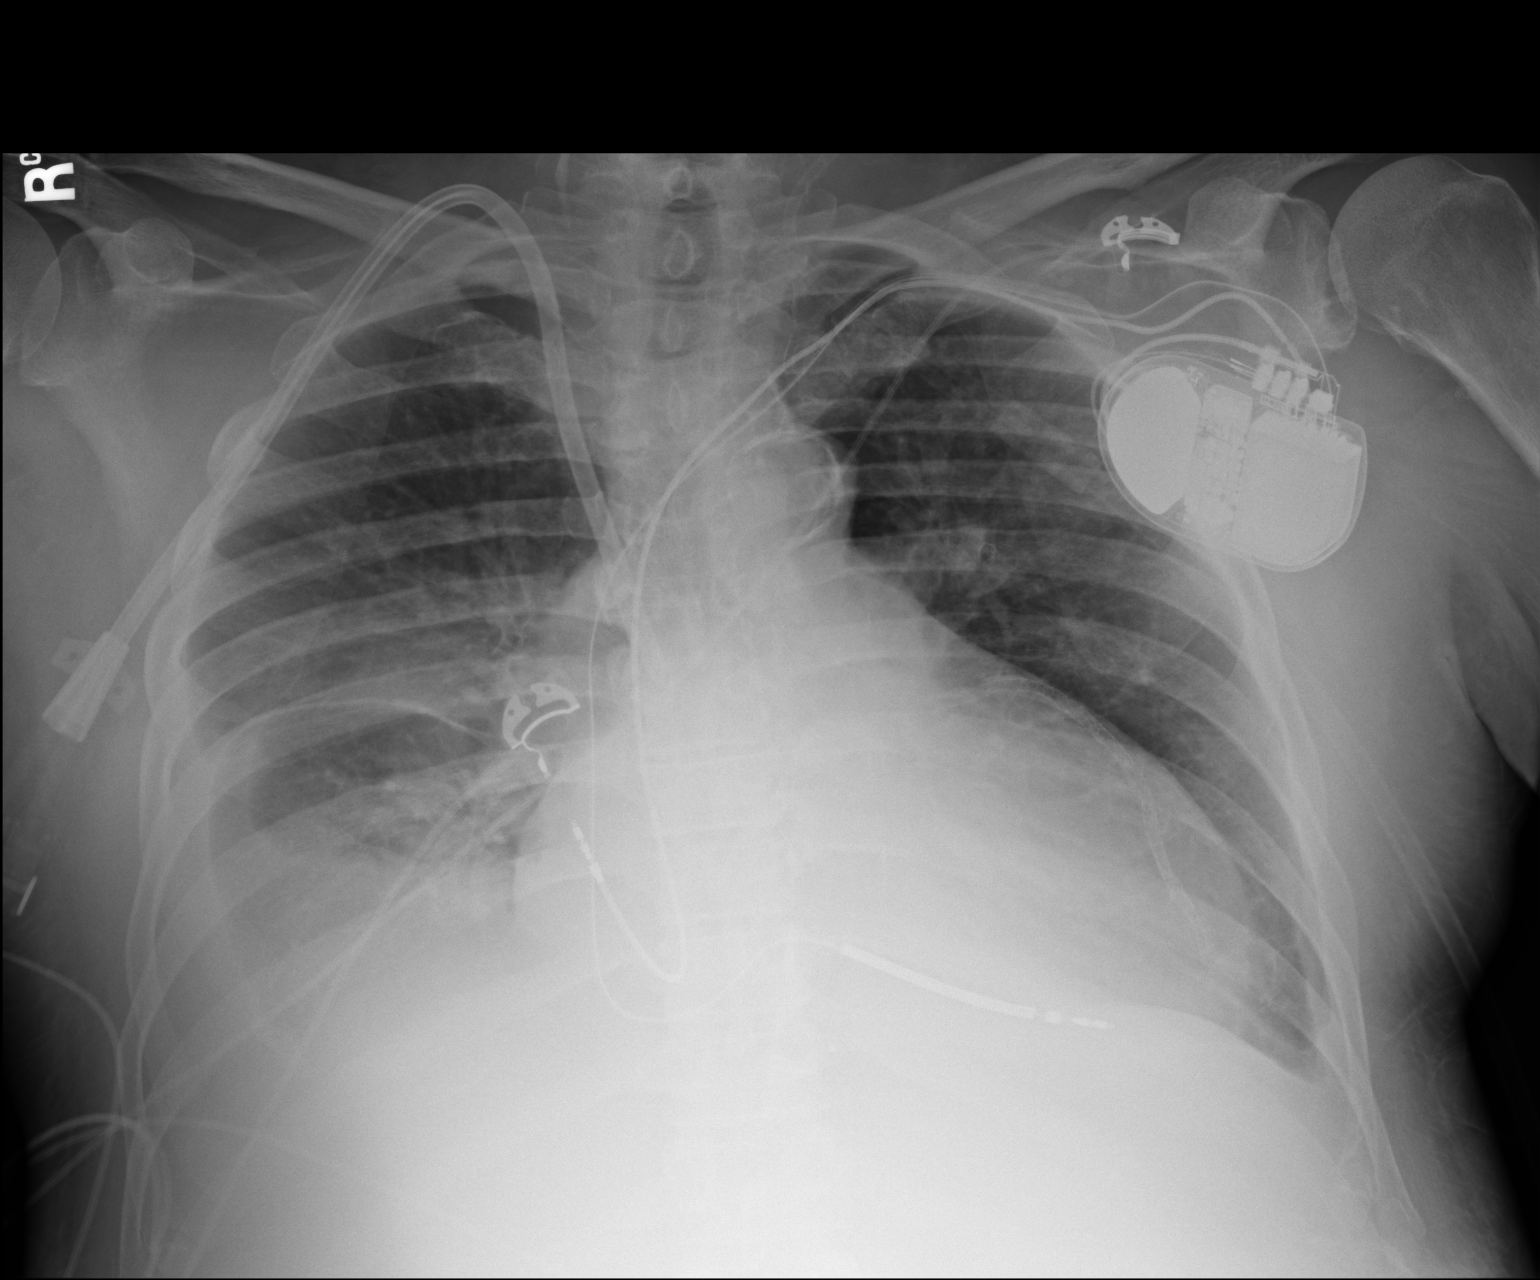

[1 of 1 positions shown; findings below may reference images not displayed]

FINDINGS: Right jugular dual-lumen catheter remains in place, terminating over
the lower SVC. Dual lead pacemaker remains in place. The cardiac
silhouette remains enlarged, unchanged. Thoracic aortic and coronary
artery atherosclerosis is again seen. Moderate right and small left
pleural effusions have not significantly changed. There is
persistent right basilar atelectasis or consolidation which has
likely not significantly changed. There is slight central pulmonary
vascular congestion without overt edema. No pneumothorax is seen.
IMPRESSION: Persistent moderate right and small left pleural effusions with
right basilar atelectasis or consolidation.

## 2017-11-25 IMAGING — CT CT CHEST W/O CM
2 of 4 series · 15 of 36 positions shown, 18 images · non-contrast
Comparison: Chest x-ray 01/18/2016. Chest CT 03/14/2015. CT abdomen
and pelvis 11/21/2015.

CLINICAL DATA: Pleural effusion, cough, shortness of breath for 2
weeks.

EXAM:
CT CHEST WITHOUT CONTRAST
TECHNIQUE: Multidetector CT imaging of the chest was performed following the
standard protocol without IV contrast.

[Series 2: chest w/o st · axial · non-contrast · 0.86mm/px · z∈[-276,+24]mm · 12 of 120 slices shown, 15 images]
[im 10/120  mediastinal]
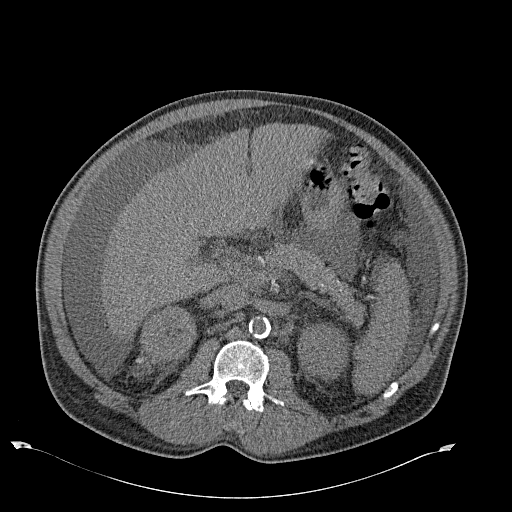
[im 10/120  lung]
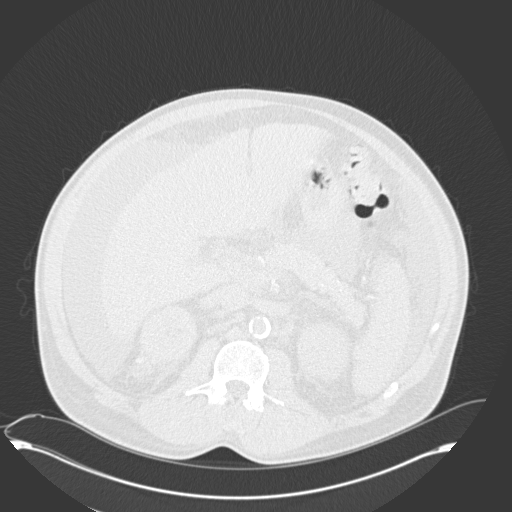
[im 19/120  lung]
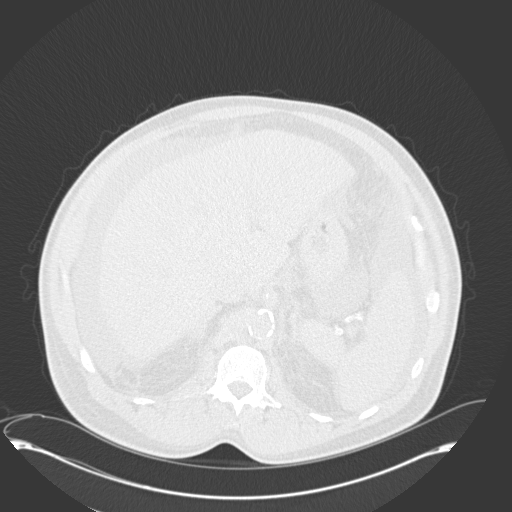
[im 28/120  lung]
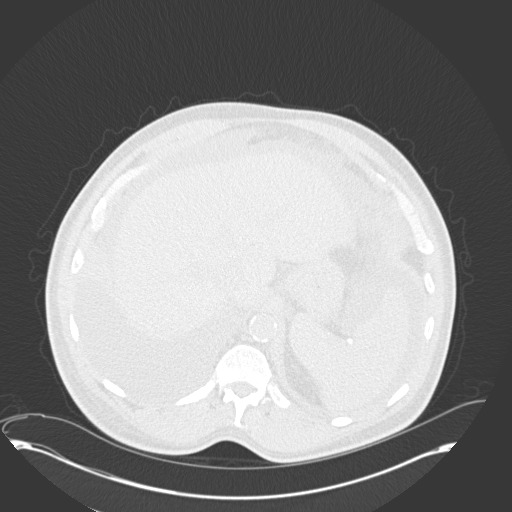
[im 37/120  lung]
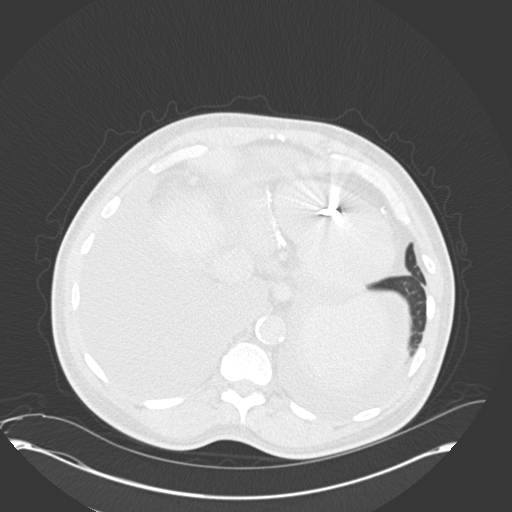
[im 46/120  mediastinal]
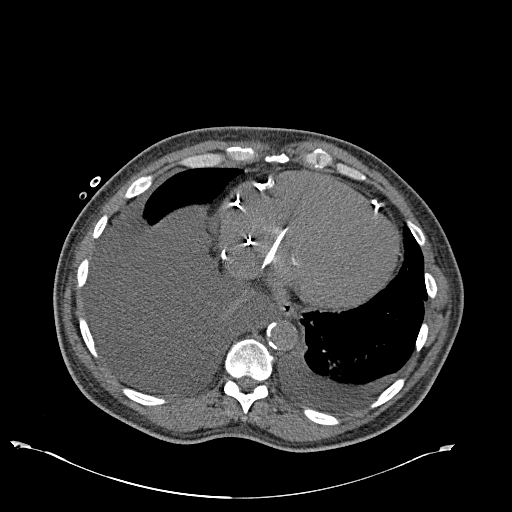
[im 46/120  lung]
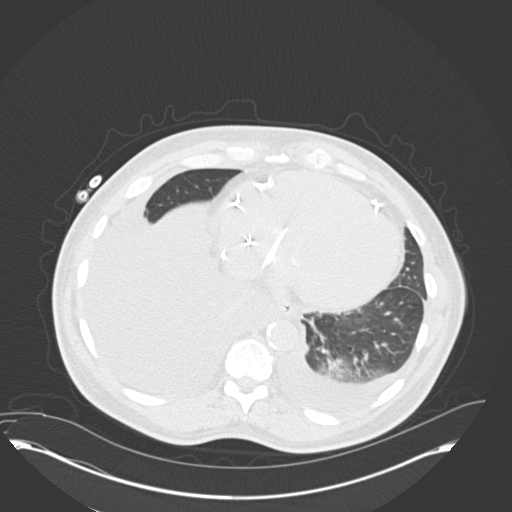
[im 55/120  lung]
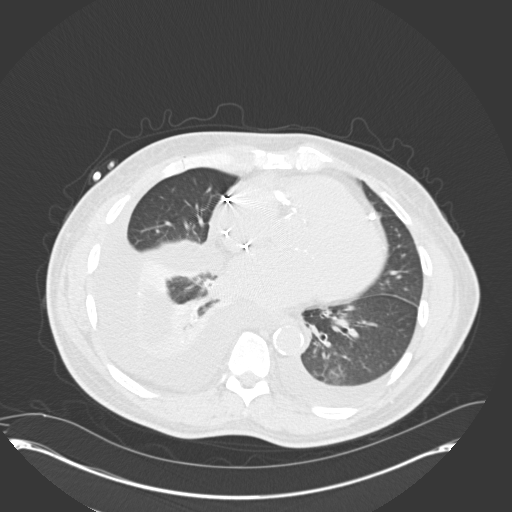
[im 65/120  lung]
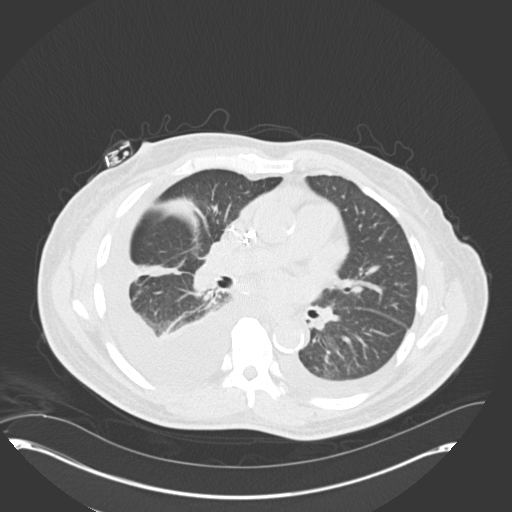
[im 74/120  lung]
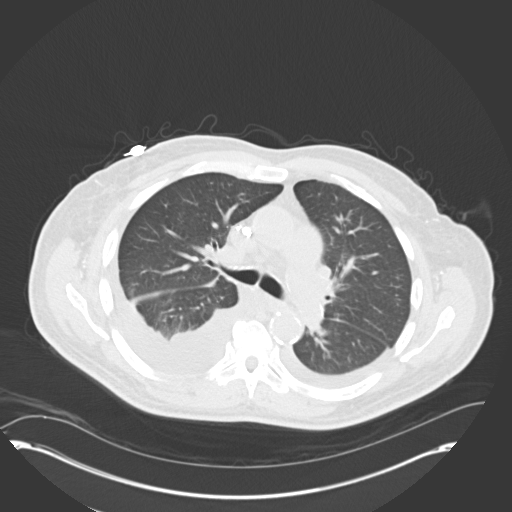
[im 83/120  mediastinal]
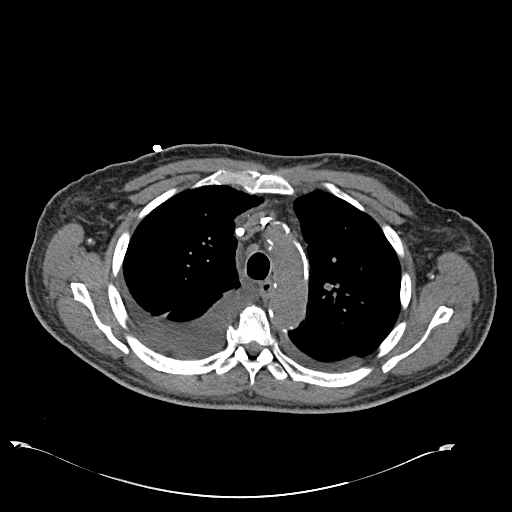
[im 83/120  lung]
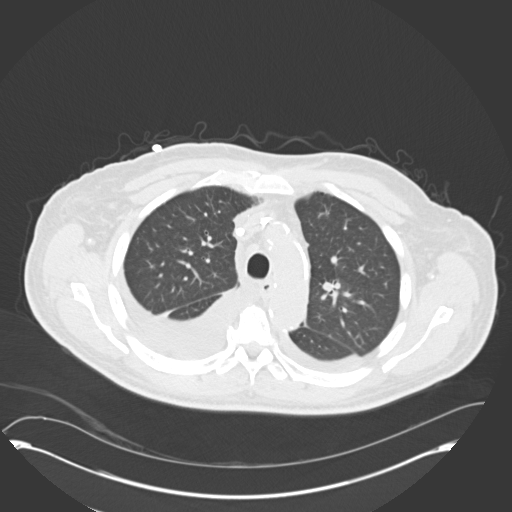
[im 92/120  lung]
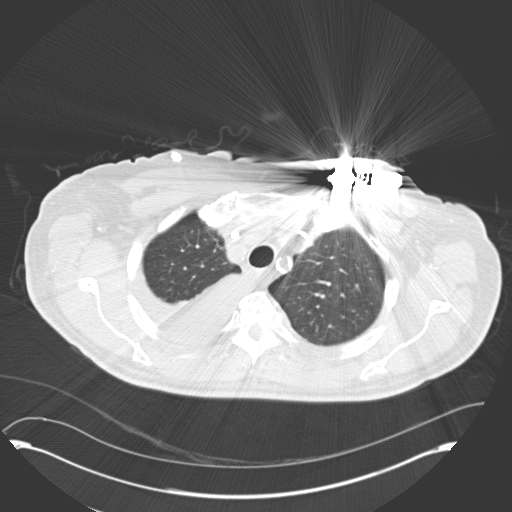
[im 101/120  lung]
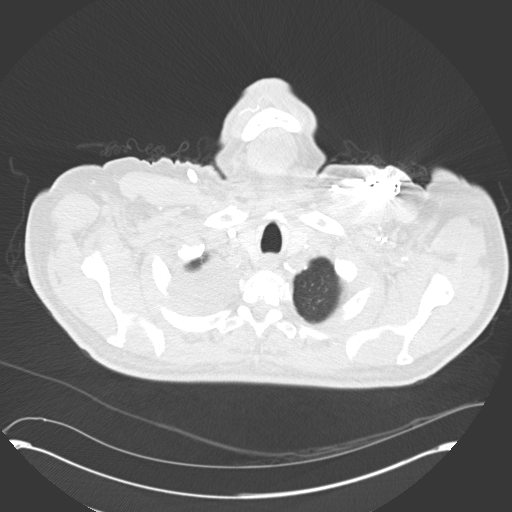
[im 110/120  lung]
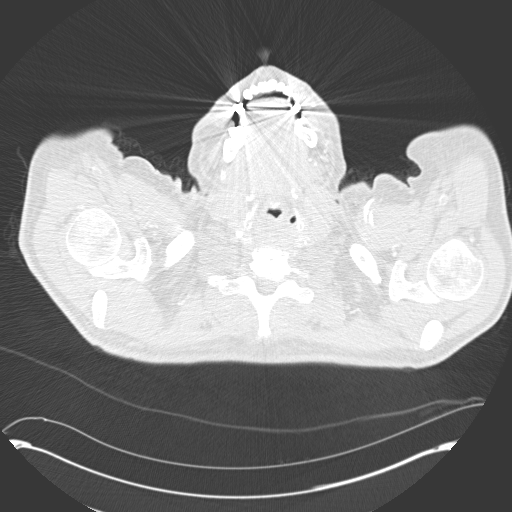

[Series 602: <mpr thick range> · coronal · 0.86mm/px · 3 of 152 slices shown]
[im 31/152  lung]
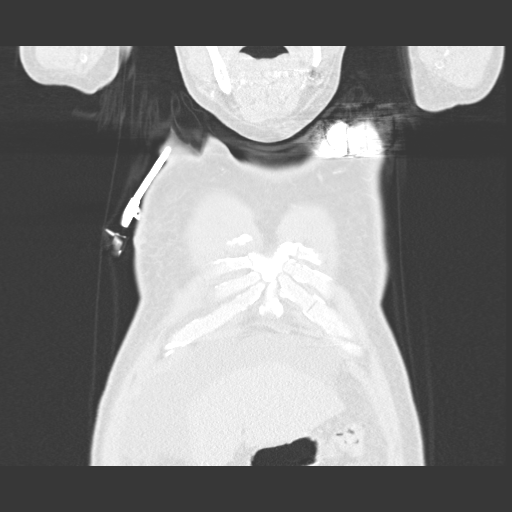
[im 61/152  lung]
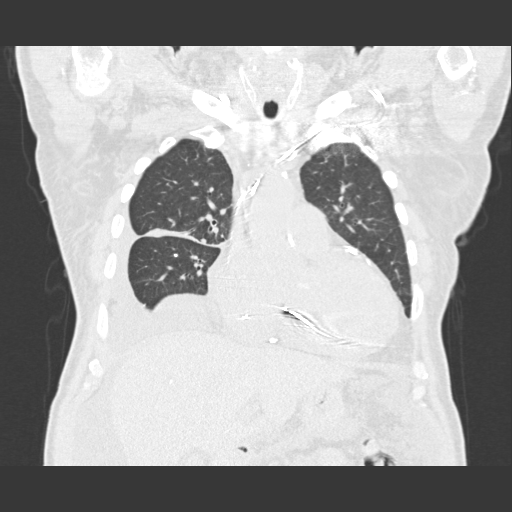
[im 91/152  lung]
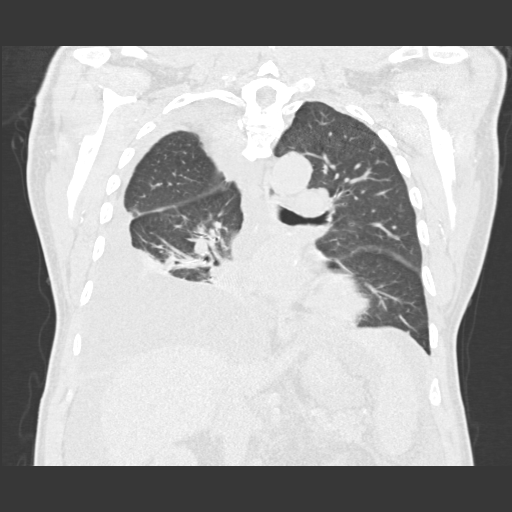

[15 of 36 positions shown; findings below may reference images not displayed]

FINDINGS: Mediastinum/Nodes: Heart is mildly enlarged. Densely calcified
coronary arteries and aorta. No aortic aneurysm. No mediastinal,
hilar, or axillary adenopathy. Left AICD in place with leads in the
right atrium and right ventricle.

Lungs/Pleura: Moderate bilateral pleural effusions, right larger
than left. Compressive atelectasis in the lower lobes bilaterally,
worse on the right.

Upper abdomen: Ascites noted around the liver and spleen. No acute
findings in the upper abdomen.

Musculoskeletal: Chest wall soft tissues are unremarkable. Pacer
battery pack noted in the left chest wall.
IMPRESSION: Moderate bilateral pleural effusions, right larger than left with
compressive atelectasis in the lower lobes.

Cardiomegaly.  Densely calcified coronary arteries and aorta.

Ascites around the liver and spleen, similar to prior CT abdomen.
